# Patient Record
Sex: Female | Born: 1940 | Race: Black or African American | Hispanic: No | Marital: Single | Smoking: Never smoker
Health system: Southern US, Community
[De-identification: ages and names within clinical notes are randomized; demographics above are authoritative.]

## PROBLEM LIST (undated history)

## (undated) ENCOUNTER — Emergency Department (HOSPITAL_COMMUNITY): Admission: EM | Payer: Medicare Other | Source: Home / Self Care

## (undated) DIAGNOSIS — E559 Vitamin D deficiency, unspecified: Secondary | ICD-10-CM

## (undated) DIAGNOSIS — R42 Dizziness and giddiness: Secondary | ICD-10-CM

## (undated) DIAGNOSIS — M2540 Effusion, unspecified joint: Secondary | ICD-10-CM

## (undated) DIAGNOSIS — H269 Unspecified cataract: Secondary | ICD-10-CM

## (undated) DIAGNOSIS — Z9289 Personal history of other medical treatment: Secondary | ICD-10-CM

## (undated) DIAGNOSIS — R350 Frequency of micturition: Secondary | ICD-10-CM

## (undated) DIAGNOSIS — L409 Psoriasis, unspecified: Secondary | ICD-10-CM

## (undated) DIAGNOSIS — T7840XA Allergy, unspecified, initial encounter: Secondary | ICD-10-CM

## (undated) DIAGNOSIS — I10 Essential (primary) hypertension: Secondary | ICD-10-CM

## (undated) DIAGNOSIS — I509 Heart failure, unspecified: Secondary | ICD-10-CM

## (undated) DIAGNOSIS — H409 Unspecified glaucoma: Secondary | ICD-10-CM

## (undated) DIAGNOSIS — M1990 Unspecified osteoarthritis, unspecified site: Secondary | ICD-10-CM

## (undated) DIAGNOSIS — Z8739 Personal history of other diseases of the musculoskeletal system and connective tissue: Secondary | ICD-10-CM

## (undated) DIAGNOSIS — M2550 Pain in unspecified joint: Secondary | ICD-10-CM

## (undated) DIAGNOSIS — N179 Acute kidney failure, unspecified: Secondary | ICD-10-CM

## (undated) DIAGNOSIS — K5190 Ulcerative colitis, unspecified, without complications: Secondary | ICD-10-CM

## (undated) DIAGNOSIS — Z8709 Personal history of other diseases of the respiratory system: Secondary | ICD-10-CM

## (undated) DIAGNOSIS — N950 Postmenopausal bleeding: Secondary | ICD-10-CM

## (undated) HISTORY — PX: TOTAL COLECTOMY: SHX852

## (undated) HISTORY — PX: ILEOSTOMY: SHX1783

## (undated) HISTORY — PX: COLONOSCOPY: SHX174

## (undated) HISTORY — PX: BACK SURGERY: SHX140

## (undated) HISTORY — PX: TUBAL LIGATION: SHX77

---

## 1998-10-09 ENCOUNTER — Ambulatory Visit (HOSPITAL_COMMUNITY): Admission: RE | Admit: 1998-10-09 | Discharge: 1998-10-09 | Payer: Self-pay | Admitting: Neurosurgery

## 1998-10-09 ENCOUNTER — Encounter: Payer: Self-pay | Admitting: Neurosurgery

## 1998-10-13 ENCOUNTER — Ambulatory Visit (HOSPITAL_COMMUNITY): Admission: RE | Admit: 1998-10-13 | Discharge: 1998-10-13 | Payer: Self-pay | Admitting: Neurosurgery

## 1998-10-13 ENCOUNTER — Encounter: Payer: Self-pay | Admitting: Neurosurgery

## 1998-11-27 ENCOUNTER — Encounter: Payer: Self-pay | Admitting: Neurosurgery

## 1998-12-01 ENCOUNTER — Inpatient Hospital Stay (HOSPITAL_COMMUNITY): Admission: RE | Admit: 1998-12-01 | Discharge: 1998-12-04 | Payer: Self-pay | Admitting: Neurosurgery

## 1998-12-01 ENCOUNTER — Encounter: Payer: Self-pay | Admitting: Neurosurgery

## 1999-02-04 ENCOUNTER — Ambulatory Visit (HOSPITAL_COMMUNITY): Admission: RE | Admit: 1999-02-04 | Discharge: 1999-02-04 | Payer: Self-pay | Admitting: Family Medicine

## 1999-02-15 ENCOUNTER — Encounter: Payer: Self-pay | Admitting: Family Medicine

## 1999-02-15 ENCOUNTER — Ambulatory Visit (HOSPITAL_COMMUNITY): Admission: RE | Admit: 1999-02-15 | Discharge: 1999-02-15 | Payer: Self-pay | Admitting: Family Medicine

## 1999-02-25 ENCOUNTER — Other Ambulatory Visit: Admission: RE | Admit: 1999-02-25 | Discharge: 1999-02-25 | Payer: Self-pay | Admitting: Obstetrics

## 1999-02-25 ENCOUNTER — Encounter: Admission: RE | Admit: 1999-02-25 | Discharge: 1999-02-25 | Payer: Self-pay | Admitting: Obstetrics

## 1999-03-11 ENCOUNTER — Encounter: Admission: RE | Admit: 1999-03-11 | Discharge: 1999-03-11 | Payer: Self-pay | Admitting: Obstetrics

## 1999-08-19 ENCOUNTER — Encounter: Admission: RE | Admit: 1999-08-19 | Discharge: 1999-08-19 | Payer: Self-pay | Admitting: Obstetrics

## 1999-09-15 ENCOUNTER — Encounter: Admission: RE | Admit: 1999-09-15 | Discharge: 1999-09-15 | Payer: Self-pay | Admitting: Neurosurgery

## 1999-09-15 ENCOUNTER — Encounter: Payer: Self-pay | Admitting: Neurosurgery

## 2000-05-30 ENCOUNTER — Encounter: Admission: RE | Admit: 2000-05-30 | Discharge: 2000-05-30 | Payer: Self-pay | Admitting: Obstetrics & Gynecology

## 2000-06-07 ENCOUNTER — Ambulatory Visit (HOSPITAL_COMMUNITY): Admission: RE | Admit: 2000-06-07 | Discharge: 2000-06-07 | Payer: Self-pay | Admitting: Obstetrics & Gynecology

## 2000-07-11 ENCOUNTER — Encounter: Admission: RE | Admit: 2000-07-11 | Discharge: 2000-07-11 | Payer: Self-pay | Admitting: Obstetrics & Gynecology

## 2000-07-31 ENCOUNTER — Ambulatory Visit (HOSPITAL_COMMUNITY): Admission: RE | Admit: 2000-07-31 | Discharge: 2000-07-31 | Payer: Self-pay | Admitting: Obstetrics & Gynecology

## 2000-07-31 ENCOUNTER — Encounter (INDEPENDENT_AMBULATORY_CARE_PROVIDER_SITE_OTHER): Payer: Self-pay | Admitting: Specialist

## 2000-08-29 ENCOUNTER — Encounter: Admission: RE | Admit: 2000-08-29 | Discharge: 2000-08-29 | Payer: Self-pay | Admitting: Obstetrics & Gynecology

## 2000-11-02 ENCOUNTER — Other Ambulatory Visit: Admission: RE | Admit: 2000-11-02 | Discharge: 2000-11-02 | Payer: Self-pay | Admitting: Obstetrics & Gynecology

## 2000-11-03 ENCOUNTER — Encounter: Admission: RE | Admit: 2000-11-03 | Discharge: 2000-11-03 | Payer: Self-pay | Admitting: Obstetrics

## 2000-11-10 ENCOUNTER — Ambulatory Visit (HOSPITAL_COMMUNITY): Admission: RE | Admit: 2000-11-10 | Discharge: 2000-11-10 | Payer: Self-pay

## 2000-11-28 ENCOUNTER — Encounter: Admission: RE | Admit: 2000-11-28 | Discharge: 2000-11-28 | Payer: Self-pay | Admitting: Obstetrics & Gynecology

## 2000-12-15 ENCOUNTER — Inpatient Hospital Stay (HOSPITAL_COMMUNITY): Admission: AD | Admit: 2000-12-15 | Discharge: 2000-12-15 | Payer: Self-pay | Admitting: *Deleted

## 2000-12-19 ENCOUNTER — Ambulatory Visit: Admission: RE | Admit: 2000-12-19 | Discharge: 2000-12-19 | Payer: Self-pay | Admitting: Gynecology

## 2001-02-20 ENCOUNTER — Encounter: Admission: RE | Admit: 2001-02-20 | Discharge: 2001-02-20 | Payer: Self-pay | Admitting: Obstetrics & Gynecology

## 2001-04-17 ENCOUNTER — Encounter: Admission: RE | Admit: 2001-04-17 | Discharge: 2001-04-17 | Payer: Self-pay | Admitting: Obstetrics & Gynecology

## 2001-05-07 ENCOUNTER — Encounter (INDEPENDENT_AMBULATORY_CARE_PROVIDER_SITE_OTHER): Payer: Self-pay

## 2001-05-07 ENCOUNTER — Ambulatory Visit (HOSPITAL_COMMUNITY): Admission: RE | Admit: 2001-05-07 | Discharge: 2001-05-07 | Payer: Self-pay | Admitting: Obstetrics & Gynecology

## 2001-05-18 ENCOUNTER — Encounter: Admission: RE | Admit: 2001-05-18 | Discharge: 2001-05-18 | Payer: Self-pay | Admitting: Neurological Surgery

## 2001-05-18 ENCOUNTER — Encounter: Payer: Self-pay | Admitting: Neurological Surgery

## 2001-06-05 ENCOUNTER — Encounter: Admission: RE | Admit: 2001-06-05 | Discharge: 2001-06-05 | Payer: Self-pay | Admitting: Obstetrics & Gynecology

## 2001-07-06 ENCOUNTER — Encounter: Admission: RE | Admit: 2001-07-06 | Discharge: 2001-07-06 | Payer: Self-pay | Admitting: Obstetrics & Gynecology

## 2001-07-06 ENCOUNTER — Other Ambulatory Visit: Admission: RE | Admit: 2001-07-06 | Discharge: 2001-07-06 | Payer: Self-pay | Admitting: *Deleted

## 2001-08-03 ENCOUNTER — Encounter: Admission: RE | Admit: 2001-08-03 | Discharge: 2001-08-03 | Payer: Self-pay | Admitting: Obstetrics & Gynecology

## 2001-08-07 ENCOUNTER — Encounter: Admission: RE | Admit: 2001-08-07 | Discharge: 2001-08-07 | Payer: Self-pay | Admitting: Obstetrics & Gynecology

## 2001-09-18 ENCOUNTER — Encounter: Admission: RE | Admit: 2001-09-18 | Discharge: 2001-09-18 | Payer: Self-pay | Admitting: Obstetrics & Gynecology

## 2002-02-27 ENCOUNTER — Other Ambulatory Visit: Admission: RE | Admit: 2002-02-27 | Discharge: 2002-02-27 | Payer: Self-pay | Admitting: Obstetrics & Gynecology

## 2002-03-04 ENCOUNTER — Ambulatory Visit (HOSPITAL_COMMUNITY): Admission: RE | Admit: 2002-03-04 | Discharge: 2002-03-04 | Payer: Self-pay | Admitting: Obstetrics & Gynecology

## 2002-03-04 ENCOUNTER — Encounter: Payer: Self-pay | Admitting: Obstetrics & Gynecology

## 2002-03-13 ENCOUNTER — Ambulatory Visit (HOSPITAL_COMMUNITY): Admission: RE | Admit: 2002-03-13 | Discharge: 2002-03-13 | Payer: Self-pay | Admitting: Obstetrics & Gynecology

## 2002-03-13 ENCOUNTER — Encounter: Payer: Self-pay | Admitting: Obstetrics & Gynecology

## 2002-03-20 ENCOUNTER — Encounter: Payer: Self-pay | Admitting: Family Medicine

## 2002-03-20 ENCOUNTER — Ambulatory Visit (HOSPITAL_COMMUNITY): Admission: RE | Admit: 2002-03-20 | Discharge: 2002-03-20 | Payer: Self-pay | Admitting: Family Medicine

## 2002-03-25 ENCOUNTER — Ambulatory Visit (HOSPITAL_COMMUNITY): Admission: RE | Admit: 2002-03-25 | Discharge: 2002-03-25 | Payer: Self-pay | Admitting: Family Medicine

## 2002-04-04 ENCOUNTER — Ambulatory Visit (HOSPITAL_COMMUNITY): Admission: RE | Admit: 2002-04-04 | Discharge: 2002-04-04 | Payer: Self-pay | Admitting: Obstetrics & Gynecology

## 2002-04-04 ENCOUNTER — Encounter: Payer: Self-pay | Admitting: Obstetrics & Gynecology

## 2002-04-04 ENCOUNTER — Encounter (INDEPENDENT_AMBULATORY_CARE_PROVIDER_SITE_OTHER): Payer: Self-pay

## 2002-11-05 ENCOUNTER — Ambulatory Visit (HOSPITAL_COMMUNITY): Admission: RE | Admit: 2002-11-05 | Discharge: 2002-11-05 | Payer: Self-pay | Admitting: Obstetrics & Gynecology

## 2002-11-05 ENCOUNTER — Encounter: Payer: Self-pay | Admitting: Obstetrics & Gynecology

## 2002-12-02 ENCOUNTER — Ambulatory Visit (HOSPITAL_COMMUNITY): Admission: RE | Admit: 2002-12-02 | Discharge: 2002-12-02 | Payer: Self-pay | Admitting: Obstetrics & Gynecology

## 2002-12-02 ENCOUNTER — Encounter: Payer: Self-pay | Admitting: Obstetrics & Gynecology

## 2002-12-27 ENCOUNTER — Ambulatory Visit (HOSPITAL_COMMUNITY): Admission: RE | Admit: 2002-12-27 | Discharge: 2002-12-27 | Payer: Self-pay | Admitting: Obstetrics & Gynecology

## 2002-12-27 ENCOUNTER — Encounter: Payer: Self-pay | Admitting: Obstetrics & Gynecology

## 2002-12-27 ENCOUNTER — Encounter (INDEPENDENT_AMBULATORY_CARE_PROVIDER_SITE_OTHER): Payer: Self-pay

## 2003-07-09 ENCOUNTER — Ambulatory Visit (HOSPITAL_COMMUNITY): Admission: RE | Admit: 2003-07-09 | Discharge: 2003-07-09 | Payer: Self-pay | Admitting: Obstetrics & Gynecology

## 2003-08-03 ENCOUNTER — Emergency Department (HOSPITAL_COMMUNITY): Admission: AD | Admit: 2003-08-03 | Discharge: 2003-08-03 | Payer: Self-pay | Admitting: Family Medicine

## 2003-09-18 ENCOUNTER — Ambulatory Visit (HOSPITAL_COMMUNITY): Admission: RE | Admit: 2003-09-18 | Discharge: 2003-09-18 | Payer: Self-pay | Admitting: Internal Medicine

## 2003-11-20 ENCOUNTER — Observation Stay (HOSPITAL_COMMUNITY): Admission: RE | Admit: 2003-11-20 | Discharge: 2003-11-21 | Payer: Self-pay | Admitting: Neurosurgery

## 2004-05-31 ENCOUNTER — Ambulatory Visit: Payer: Self-pay | Admitting: Family Medicine

## 2004-08-30 ENCOUNTER — Ambulatory Visit: Payer: Self-pay | Admitting: Family Medicine

## 2004-08-30 ENCOUNTER — Ambulatory Visit (HOSPITAL_COMMUNITY): Admission: RE | Admit: 2004-08-30 | Discharge: 2004-08-30 | Payer: Self-pay | Admitting: Obstetrics & Gynecology

## 2004-09-29 ENCOUNTER — Ambulatory Visit: Payer: Self-pay | Admitting: Family Medicine

## 2004-10-28 ENCOUNTER — Ambulatory Visit: Payer: Self-pay | Admitting: Family Medicine

## 2004-11-02 ENCOUNTER — Ambulatory Visit: Payer: Self-pay | Admitting: Family Medicine

## 2004-11-17 ENCOUNTER — Emergency Department (HOSPITAL_COMMUNITY): Admission: EM | Admit: 2004-11-17 | Discharge: 2004-11-18 | Payer: Self-pay | Admitting: Emergency Medicine

## 2005-01-11 ENCOUNTER — Ambulatory Visit: Payer: Self-pay | Admitting: Family Medicine

## 2005-01-17 ENCOUNTER — Ambulatory Visit: Payer: Self-pay | Admitting: Family Medicine

## 2005-01-17 LAB — CONVERTED CEMR LAB: Hgb A1c MFr Bld: 5.4 %

## 2005-02-28 ENCOUNTER — Ambulatory Visit: Payer: Self-pay | Admitting: Family Medicine

## 2005-05-02 ENCOUNTER — Ambulatory Visit: Payer: Self-pay | Admitting: Family Medicine

## 2005-11-03 ENCOUNTER — Ambulatory Visit: Payer: Self-pay | Admitting: Family Medicine

## 2006-01-02 ENCOUNTER — Ambulatory Visit: Payer: Self-pay | Admitting: Family Medicine

## 2006-01-23 ENCOUNTER — Ambulatory Visit (HOSPITAL_COMMUNITY): Admission: RE | Admit: 2006-01-23 | Discharge: 2006-01-23 | Payer: Self-pay | Admitting: Neurosurgery

## 2006-02-17 ENCOUNTER — Encounter: Admission: RE | Admit: 2006-02-17 | Discharge: 2006-03-07 | Payer: Self-pay | Admitting: Neurosurgery

## 2006-02-21 ENCOUNTER — Ambulatory Visit: Payer: Self-pay | Admitting: Family Medicine

## 2006-02-21 DIAGNOSIS — M109 Gout, unspecified: Secondary | ICD-10-CM | POA: Insufficient documentation

## 2006-06-21 ENCOUNTER — Ambulatory Visit: Payer: Self-pay | Admitting: Family Medicine

## 2006-11-02 ENCOUNTER — Ambulatory Visit: Payer: Self-pay | Admitting: Family Medicine

## 2006-12-21 ENCOUNTER — Encounter (INDEPENDENT_AMBULATORY_CARE_PROVIDER_SITE_OTHER): Payer: Self-pay | Admitting: Family Medicine

## 2007-04-19 ENCOUNTER — Encounter: Payer: Self-pay | Admitting: Family Medicine

## 2007-04-19 DIAGNOSIS — M519 Unspecified thoracic, thoracolumbar and lumbosacral intervertebral disc disorder: Secondary | ICD-10-CM | POA: Insufficient documentation

## 2007-04-19 DIAGNOSIS — N949 Unspecified condition associated with female genital organs and menstrual cycle: Secondary | ICD-10-CM | POA: Insufficient documentation

## 2007-04-19 DIAGNOSIS — J45909 Unspecified asthma, uncomplicated: Secondary | ICD-10-CM | POA: Insufficient documentation

## 2007-04-19 DIAGNOSIS — I10 Essential (primary) hypertension: Secondary | ICD-10-CM | POA: Insufficient documentation

## 2007-04-19 DIAGNOSIS — L408 Other psoriasis: Secondary | ICD-10-CM | POA: Insufficient documentation

## 2007-04-19 DIAGNOSIS — H4089 Other specified glaucoma: Secondary | ICD-10-CM | POA: Insufficient documentation

## 2007-04-19 DIAGNOSIS — K5190 Ulcerative colitis, unspecified, without complications: Secondary | ICD-10-CM | POA: Insufficient documentation

## 2007-04-19 LAB — CONVERTED CEMR LAB
HDL: 30 mg/dL
LDL Cholesterol: 80 mg/dL
Triglycerides: 137 mg/dL

## 2007-04-23 ENCOUNTER — Ambulatory Visit: Payer: Self-pay | Admitting: Family Medicine

## 2007-04-26 ENCOUNTER — Ambulatory Visit: Payer: Self-pay | Admitting: Family Medicine

## 2007-04-26 LAB — CONVERTED CEMR LAB
ALT: 14 units/L (ref 0–35)
AST: 27 units/L (ref 0–37)
Albumin: 3.5 g/dL (ref 3.5–5.2)
Alkaline Phosphatase: 61 units/L (ref 39–117)
BUN: 17 mg/dL (ref 6–23)
CO2: 25 meq/L (ref 19–32)
Calcium: 8.1 mg/dL — ABNORMAL LOW (ref 8.4–10.5)
Chloride: 103 meq/L (ref 96–112)
Cholesterol: 156 mg/dL (ref 0–200)
Creatinine, Ser: 1.34 mg/dL — ABNORMAL HIGH (ref 0.40–1.20)
Glucose, Bld: 82 mg/dL (ref 70–99)
HDL: 55 mg/dL (ref >39)
LDL Cholesterol: 82 mg/dL (ref 0–99)
Potassium: 3.6 meq/L (ref 3.5–5.3)
Sodium: 142 meq/L (ref 135–145)
Total Bilirubin: 0.7 mg/dL (ref 0.3–1.2)
Total CHOL/HDL Ratio: 2.8
Total Protein: 7.2 g/dL (ref 6.0–8.3)
Triglycerides: 97 mg/dL (ref <150)
VLDL: 19 mg/dL (ref 0–40)
Vit D, 1,25-Dihydroxy: 6 — ABNORMAL LOW (ref 20–57)

## 2007-04-30 DIAGNOSIS — E559 Vitamin D deficiency, unspecified: Secondary | ICD-10-CM | POA: Insufficient documentation

## 2007-06-25 ENCOUNTER — Ambulatory Visit: Payer: Self-pay | Admitting: Family Medicine

## 2007-06-25 DIAGNOSIS — R7989 Other specified abnormal findings of blood chemistry: Secondary | ICD-10-CM | POA: Insufficient documentation

## 2007-06-25 DIAGNOSIS — J309 Allergic rhinitis, unspecified: Secondary | ICD-10-CM | POA: Insufficient documentation

## 2007-08-24 ENCOUNTER — Ambulatory Visit: Payer: Self-pay | Admitting: Family Medicine

## 2007-08-24 LAB — CONVERTED CEMR LAB
ALT: 18 U/L
AST: 29 U/L
Albumin: 3.3 g/dL — ABNORMAL LOW
Alkaline Phosphatase: 69 U/L
BUN: 14 mg/dL
CO2: 26 meq/L
Calcium: 8.2 mg/dL — ABNORMAL LOW
Chloride: 107 meq/L
Creatinine, Ser: 1.34 mg/dL — ABNORMAL HIGH
Glucose, Bld: 80 mg/dL
Potassium: 3.6 meq/L
Sodium: 144 meq/L
Total Bilirubin: 0.6 mg/dL
Total Protein: 7.3 g/dL

## 2007-08-25 ENCOUNTER — Encounter (INDEPENDENT_AMBULATORY_CARE_PROVIDER_SITE_OTHER): Payer: Self-pay | Admitting: Family Medicine

## 2007-08-25 LAB — CONVERTED CEMR LAB
Collection Interval-CRCL: 24 hr
Creatinine 24 HR UR: 1367 mg/24hr (ref 700–1800)
Creatinine Clearance: 72 mL/min — ABNORMAL LOW (ref 75–115)
Creatinine, Urine: 170.9 mg/dL

## 2007-09-14 ENCOUNTER — Emergency Department (HOSPITAL_COMMUNITY): Admission: EM | Admit: 2007-09-14 | Discharge: 2007-09-14 | Payer: Self-pay | Admitting: Emergency Medicine

## 2007-12-24 ENCOUNTER — Encounter (INDEPENDENT_AMBULATORY_CARE_PROVIDER_SITE_OTHER): Payer: Self-pay | Admitting: Family Medicine

## 2007-12-31 ENCOUNTER — Ambulatory Visit: Payer: Self-pay | Admitting: Family Medicine

## 2007-12-31 DIAGNOSIS — R1032 Left lower quadrant pain: Secondary | ICD-10-CM | POA: Insufficient documentation

## 2008-01-14 ENCOUNTER — Ambulatory Visit: Payer: Self-pay | Admitting: Gastroenterology

## 2008-01-14 ENCOUNTER — Encounter (INDEPENDENT_AMBULATORY_CARE_PROVIDER_SITE_OTHER): Payer: Self-pay | Admitting: Family Medicine

## 2008-01-22 ENCOUNTER — Emergency Department (HOSPITAL_COMMUNITY): Admission: EM | Admit: 2008-01-22 | Discharge: 2008-01-22 | Payer: Self-pay | Admitting: Emergency Medicine

## 2008-02-11 ENCOUNTER — Ambulatory Visit (HOSPITAL_COMMUNITY): Admission: RE | Admit: 2008-02-11 | Discharge: 2008-02-11 | Payer: Self-pay | Admitting: Obstetrics & Gynecology

## 2008-02-18 LAB — CONVERTED CEMR LAB
ALT: 17 units/L (ref 0–35)
AST: 32 units/L (ref 0–37)
Albumin: 3.3 g/dL — ABNORMAL LOW (ref 3.5–5.2)
Alkaline Phosphatase: 74 units/L (ref 39–117)
BUN: 18 mg/dL (ref 6–23)
CO2: 26 meq/L (ref 19–32)
Calcium: 8.7 mg/dL (ref 8.4–10.5)
Chloride: 106 meq/L (ref 96–112)
Creatinine, Ser: 1.28 mg/dL — ABNORMAL HIGH (ref 0.40–1.20)
Glucose, Bld: 69 mg/dL — ABNORMAL LOW (ref 70–99)
Potassium: 3.9 meq/L (ref 3.5–5.3)
Sodium: 144 meq/L (ref 135–145)
Total Bilirubin: 0.5 mg/dL (ref 0.3–1.2)
Total Protein: 8 g/dL (ref 6.0–8.3)
Vit D, 1,25-Dihydroxy: 11 — ABNORMAL LOW (ref 30–89)

## 2008-04-11 ENCOUNTER — Encounter (INDEPENDENT_AMBULATORY_CARE_PROVIDER_SITE_OTHER): Payer: Self-pay | Admitting: Family Medicine

## 2008-05-19 ENCOUNTER — Inpatient Hospital Stay (HOSPITAL_COMMUNITY): Admission: EM | Admit: 2008-05-19 | Discharge: 2008-05-21 | Payer: Self-pay | Admitting: Emergency Medicine

## 2008-05-26 ENCOUNTER — Ambulatory Visit: Payer: Self-pay | Admitting: Family Medicine

## 2008-05-26 DIAGNOSIS — R079 Chest pain, unspecified: Secondary | ICD-10-CM | POA: Insufficient documentation

## 2008-06-14 ENCOUNTER — Telehealth (INDEPENDENT_AMBULATORY_CARE_PROVIDER_SITE_OTHER): Payer: Self-pay | Admitting: Family Medicine

## 2008-06-30 ENCOUNTER — Ambulatory Visit: Payer: Self-pay | Admitting: Family Medicine

## 2008-07-01 ENCOUNTER — Encounter (INDEPENDENT_AMBULATORY_CARE_PROVIDER_SITE_OTHER): Payer: Self-pay | Admitting: Family Medicine

## 2008-07-28 ENCOUNTER — Encounter (INDEPENDENT_AMBULATORY_CARE_PROVIDER_SITE_OTHER): Payer: Self-pay | Admitting: Family Medicine

## 2008-07-28 LAB — CONVERTED CEMR LAB
ALT: 22 units/L (ref 0–35)
AST: 44 units/L — ABNORMAL HIGH (ref 0–37)
Albumin: 3.3 g/dL — ABNORMAL LOW (ref 3.5–5.2)
Alkaline Phosphatase: 90 units/L (ref 39–117)
BUN: 19 mg/dL (ref 6–23)
CO2: 23 meq/L (ref 19–32)
Calcium, Total (PTH): 8.9 mg/dL (ref 8.4–10.5)
Calcium: 8.9 mg/dL (ref 8.4–10.5)
Chloride: 104 meq/L (ref 96–112)
Creatinine, Ser: 1.16 mg/dL (ref 0.40–1.20)
Glucose, Bld: 88 mg/dL (ref 70–99)
PTH: 25.6 pg/mL (ref 14.0–72.0)
Potassium: 3.7 meq/L (ref 3.5–5.3)
Sodium: 142 meq/L (ref 135–145)
Total Bilirubin: 0.8 mg/dL (ref 0.3–1.2)
Total Protein: 7.5 g/dL (ref 6.0–8.3)
Vit D, 1,25-Dihydroxy: 8 — ABNORMAL LOW (ref 30–89)

## 2008-08-12 ENCOUNTER — Ambulatory Visit (HOSPITAL_COMMUNITY): Admission: RE | Admit: 2008-08-12 | Discharge: 2008-08-12 | Payer: Self-pay | Admitting: Obstetrics & Gynecology

## 2008-09-30 ENCOUNTER — Encounter: Payer: Self-pay | Admitting: Obstetrics & Gynecology

## 2008-09-30 ENCOUNTER — Ambulatory Visit (HOSPITAL_COMMUNITY): Admission: RE | Admit: 2008-09-30 | Discharge: 2008-09-30 | Payer: Self-pay | Admitting: Obstetrics & Gynecology

## 2008-11-03 ENCOUNTER — Ambulatory Visit: Payer: Self-pay | Admitting: Family Medicine

## 2008-11-04 LAB — CONVERTED CEMR LAB: Vit D, 25-Hydroxy: 28 ng/mL — ABNORMAL LOW (ref 30–89)

## 2008-11-15 ENCOUNTER — Encounter (INDEPENDENT_AMBULATORY_CARE_PROVIDER_SITE_OTHER): Payer: Self-pay | Admitting: Family Medicine

## 2008-11-21 ENCOUNTER — Encounter (INDEPENDENT_AMBULATORY_CARE_PROVIDER_SITE_OTHER): Payer: Self-pay | Admitting: Family Medicine

## 2008-12-24 ENCOUNTER — Encounter (INDEPENDENT_AMBULATORY_CARE_PROVIDER_SITE_OTHER): Payer: Self-pay | Admitting: Family Medicine

## 2009-01-02 ENCOUNTER — Telehealth (INDEPENDENT_AMBULATORY_CARE_PROVIDER_SITE_OTHER): Payer: Self-pay | Admitting: Family Medicine

## 2009-01-05 ENCOUNTER — Ambulatory Visit: Payer: Self-pay | Admitting: Family Medicine

## 2009-01-07 ENCOUNTER — Encounter (INDEPENDENT_AMBULATORY_CARE_PROVIDER_SITE_OTHER): Payer: Self-pay | Admitting: Family Medicine

## 2009-01-07 LAB — CONVERTED CEMR LAB: Vit D, 25-Hydroxy: 43 ng/mL (ref 30–89)

## 2009-01-13 ENCOUNTER — Telehealth (INDEPENDENT_AMBULATORY_CARE_PROVIDER_SITE_OTHER): Payer: Self-pay | Admitting: Family Medicine

## 2009-02-26 ENCOUNTER — Encounter: Admission: RE | Admit: 2009-02-26 | Discharge: 2009-02-26 | Payer: Self-pay | Admitting: Neurosurgery

## 2009-02-26 ENCOUNTER — Encounter (INDEPENDENT_AMBULATORY_CARE_PROVIDER_SITE_OTHER): Payer: Self-pay | Admitting: Family Medicine

## 2009-03-10 ENCOUNTER — Encounter (INDEPENDENT_AMBULATORY_CARE_PROVIDER_SITE_OTHER): Payer: Self-pay | Admitting: Internal Medicine

## 2009-04-03 ENCOUNTER — Encounter: Admission: RE | Admit: 2009-04-03 | Discharge: 2009-05-01 | Payer: Self-pay | Admitting: Neurosurgery

## 2009-05-13 ENCOUNTER — Encounter (INDEPENDENT_AMBULATORY_CARE_PROVIDER_SITE_OTHER): Payer: Self-pay | Admitting: Nurse Practitioner

## 2009-07-22 ENCOUNTER — Telehealth (INDEPENDENT_AMBULATORY_CARE_PROVIDER_SITE_OTHER): Payer: Self-pay | Admitting: Internal Medicine

## 2009-07-25 ENCOUNTER — Emergency Department (HOSPITAL_COMMUNITY): Admission: EM | Admit: 2009-07-25 | Discharge: 2009-07-25 | Payer: Self-pay | Admitting: Emergency Medicine

## 2009-07-30 ENCOUNTER — Ambulatory Visit: Payer: Self-pay | Admitting: Internal Medicine

## 2009-07-30 DIAGNOSIS — E876 Hypokalemia: Secondary | ICD-10-CM | POA: Insufficient documentation

## 2009-08-04 ENCOUNTER — Ambulatory Visit: Payer: Self-pay | Admitting: Internal Medicine

## 2009-08-11 ENCOUNTER — Ambulatory Visit: Payer: Self-pay | Admitting: Internal Medicine

## 2009-08-18 ENCOUNTER — Ambulatory Visit: Payer: Self-pay | Admitting: Internal Medicine

## 2009-09-28 ENCOUNTER — Encounter (INDEPENDENT_AMBULATORY_CARE_PROVIDER_SITE_OTHER): Payer: Self-pay | Admitting: Internal Medicine

## 2009-09-29 ENCOUNTER — Telehealth (INDEPENDENT_AMBULATORY_CARE_PROVIDER_SITE_OTHER): Payer: Self-pay | Admitting: Internal Medicine

## 2009-10-20 ENCOUNTER — Ambulatory Visit: Payer: Self-pay | Admitting: Internal Medicine

## 2009-10-20 DIAGNOSIS — R609 Edema, unspecified: Secondary | ICD-10-CM | POA: Insufficient documentation

## 2009-11-16 ENCOUNTER — Encounter (INDEPENDENT_AMBULATORY_CARE_PROVIDER_SITE_OTHER): Payer: Self-pay | Admitting: Internal Medicine

## 2009-11-17 ENCOUNTER — Telehealth: Payer: Self-pay | Admitting: Physician Assistant

## 2009-11-18 ENCOUNTER — Telehealth (INDEPENDENT_AMBULATORY_CARE_PROVIDER_SITE_OTHER): Payer: Self-pay | Admitting: Internal Medicine

## 2009-11-21 ENCOUNTER — Encounter (INDEPENDENT_AMBULATORY_CARE_PROVIDER_SITE_OTHER): Payer: Self-pay | Admitting: Internal Medicine

## 2010-01-08 LAB — CONVERTED CEMR LAB: Pap Smear: NORMAL

## 2010-02-18 ENCOUNTER — Ambulatory Visit: Payer: Self-pay | Admitting: Internal Medicine

## 2010-02-18 DIAGNOSIS — R011 Cardiac murmur, unspecified: Secondary | ICD-10-CM | POA: Insufficient documentation

## 2010-02-18 DIAGNOSIS — I491 Atrial premature depolarization: Secondary | ICD-10-CM | POA: Insufficient documentation

## 2010-02-18 DIAGNOSIS — I4949 Other premature depolarization: Secondary | ICD-10-CM | POA: Insufficient documentation

## 2010-02-18 LAB — CONVERTED CEMR LAB
Bilirubin Urine: NEGATIVE
Blood in Urine, dipstick: NEGATIVE
Glucose, Urine, Semiquant: NEGATIVE
Ketones, urine, test strip: NEGATIVE
Nitrite: NEGATIVE
Protein, U semiquant: NEGATIVE
Specific Gravity, Urine: 1.01
Urobilinogen, UA: 0.2
WBC Urine, dipstick: NEGATIVE
pH: 6

## 2010-02-19 ENCOUNTER — Telehealth (INDEPENDENT_AMBULATORY_CARE_PROVIDER_SITE_OTHER): Payer: Self-pay | Admitting: Internal Medicine

## 2010-02-21 ENCOUNTER — Ambulatory Visit: Payer: Self-pay | Admitting: Cardiology

## 2010-03-02 DIAGNOSIS — N259 Disorder resulting from impaired renal tubular function, unspecified: Secondary | ICD-10-CM | POA: Insufficient documentation

## 2010-03-02 LAB — CONVERTED CEMR LAB
ALT: 16 units/L (ref 0–35)
AST: 33 units/L (ref 0–37)
Albumin: 3.4 g/dL — ABNORMAL LOW (ref 3.5–5.2)
Alkaline Phosphatase: 106 units/L (ref 39–117)
BUN: 12 mg/dL (ref 6–23)
CO2: 27 meq/L (ref 19–32)
Calcium: 8.5 mg/dL (ref 8.4–10.5)
Chloride: 105 meq/L (ref 96–112)
Cholesterol: 150 mg/dL (ref 0–200)
Creatinine, Ser: 1.3 mg/dL — ABNORMAL HIGH (ref 0.40–1.20)
Glucose, Bld: 68 mg/dL — ABNORMAL LOW (ref 70–99)
HDL: 53 mg/dL (ref >39)
LDL Cholesterol: 75 mg/dL (ref 0–99)
Potassium: 3.7 meq/L (ref 3.5–5.3)
Sodium: 145 meq/L (ref 135–145)
Total Bilirubin: 0.7 mg/dL (ref 0.3–1.2)
Total CHOL/HDL Ratio: 2.8
Total Protein: 7.5 g/dL (ref 6.0–8.3)
Triglycerides: 110 mg/dL (ref <150)
VLDL: 22 mg/dL (ref 0–40)

## 2010-03-03 ENCOUNTER — Ambulatory Visit: Payer: Self-pay | Admitting: Internal Medicine

## 2010-03-03 ENCOUNTER — Encounter (INDEPENDENT_AMBULATORY_CARE_PROVIDER_SITE_OTHER): Payer: Self-pay | Admitting: Internal Medicine

## 2010-03-03 ENCOUNTER — Ambulatory Visit (HOSPITAL_COMMUNITY): Admission: RE | Admit: 2010-03-03 | Discharge: 2010-03-03 | Payer: Self-pay | Admitting: Internal Medicine

## 2010-03-03 DIAGNOSIS — I079 Rheumatic tricuspid valve disease, unspecified: Secondary | ICD-10-CM | POA: Insufficient documentation

## 2010-03-03 LAB — CONVERTED CEMR LAB
Basophils Absolute: 0.1 10*3/uL (ref 0.0–0.1)
Basophils Relative: 1 % (ref 0–1)
Eosinophils Absolute: 0.1 10*3/uL (ref 0.0–0.7)
Eosinophils Relative: 1 % (ref 0–5)
HCT: 45 % (ref 36.0–46.0)
Hemoglobin: 15 g/dL (ref 12.0–15.0)
Lymphocytes Relative: 49 % — ABNORMAL HIGH (ref 12–46)
Lymphs Abs: 5 10*3/uL — ABNORMAL HIGH (ref 0.7–4.0)
MCHC: 33.3 g/dL (ref 30.0–36.0)
MCV: 93.6 fL (ref 78.0–100.0)
Monocytes Absolute: 0.9 10*3/uL (ref 0.1–1.0)
Monocytes Relative: 9 % (ref 3–12)
Neutro Abs: 4.1 10*3/uL (ref 1.7–7.7)
Neutrophils Relative %: 41 % — ABNORMAL LOW (ref 43–77)
Platelets: 229 10*3/uL (ref 150–400)
RBC: 4.81 M/uL (ref 3.87–5.11)
RDW: 14 % (ref 11.5–15.5)
WBC: 10.2 10*3/uL (ref 4.0–10.5)

## 2010-03-11 ENCOUNTER — Encounter (INDEPENDENT_AMBULATORY_CARE_PROVIDER_SITE_OTHER): Payer: Self-pay | Admitting: Internal Medicine

## 2010-03-20 ENCOUNTER — Encounter (INDEPENDENT_AMBULATORY_CARE_PROVIDER_SITE_OTHER): Payer: Self-pay | Admitting: Internal Medicine

## 2010-03-20 DIAGNOSIS — IMO0002 Reserved for concepts with insufficient information to code with codable children: Secondary | ICD-10-CM | POA: Insufficient documentation

## 2010-03-20 DIAGNOSIS — M1710 Unilateral primary osteoarthritis, unspecified knee: Secondary | ICD-10-CM | POA: Insufficient documentation

## 2010-05-05 ENCOUNTER — Telehealth (INDEPENDENT_AMBULATORY_CARE_PROVIDER_SITE_OTHER): Payer: Self-pay | Admitting: Internal Medicine

## 2010-07-22 ENCOUNTER — Telehealth (INDEPENDENT_AMBULATORY_CARE_PROVIDER_SITE_OTHER): Payer: Self-pay | Admitting: Internal Medicine

## 2010-08-20 ENCOUNTER — Ambulatory Visit: Payer: Self-pay | Admitting: Internal Medicine

## 2010-10-03 ENCOUNTER — Encounter: Payer: Self-pay | Admitting: Obstetrics & Gynecology

## 2010-10-14 NOTE — Letter (Signed)
Summary: [facility]   Imported By: Roland Earl 04/06/2010 14:51:09  _____________________________________________________________________  External Attachment:    Type:   Image     Comment:   External Document

## 2010-10-14 NOTE — Assessment & Plan Note (Signed)
Summary: 2 MONTH FU ON LEG SWELLING///KT   Vital Signs:  Patient profile:   70 year old female Weight:      240.6 pounds Temp:     98.3 degrees F Pulse rate:   76 / minute Pulse rhythm:   regular Resp:     16 per minute BP sitting:   145 / 84  (left arm) Cuff size:   large  Vitals Entered By: Shellia Carwin CMA (October 20, 2009 2:22 PM) CC: f/u lower leg cellulitis per pt doing much better. Is Patient Diabetic? No Pain Assessment Patient in pain? no       Does patient need assistance? Ambulation Normal   Primary Care Provider:  Milagros Evener MD  CC:  f/u lower leg cellulitis per pt doing much better.Marland Kitchen  History of Present Illness: 1.  LE edema:  much better.  Using Furosemide only as needed--takes potassium when uses.  On HCTz regularly with BP control.  2.  Cellulitis:  Skin changes gradually resolving--skin still flaking off where thickened and darkened--getting back to normal.  3.  Asthma:  wheezing on and off since last week.  Using Singulair, Advair, Cetirizine regularly.  Does not have a rescue inhaler.    Allergies (verified): 1)  ! Sulfa  Physical Exam  General:  NAD Lungs:  Scant scattered end expiratory wheeze. Heart:  Normal rate and regular rhythm. S1 and S2 normal without gallop, murmur, click, rub or other extra sounds. Extremities:  trace to mild edema of bilateral feet and pretibial areas.  Still with some hyperpigmentation in same areas where cellulitis was most significant, but appears to be resolving.  No cracking of skin or erythema   Impression & Recommendations:  Problem # 1:  CELLULITIS OF LEGS AND FEET (ICD-682.8) Resolved  Problem # 2:  EDEMA (ICD-782.3) Improved--continue to use furosemide as needed  Her updated medication list for this problem includes:    Diovan Hct 160-25 Mg Tabs (Valsartan-hydrochlorothiazide) .Marland Kitchen... Take 1 tablet by mouth once a day    Furosemide 20 Mg Tabs (Furosemide) .Marland Kitchen... 1 tab by mouth in  morning.  Problem # 3:  ASTHMA (ICD-493.90) Rescue inhaler written. Her updated medication list for this problem includes:    Advair Diskus 250-50 Mcg/dose Misc (Fluticasone-salmeterol) .Marland Kitchen... 1 inhalation two times a day    Singulair 10 Mg Tabs (Montelukast sodium) .Marland Kitchen... 1 by mouth once daily    Proventil Hfa 108 (90 Base) Mcg/act Aers (Albuterol sulfate) .Marland Kitchen... 2 puffs every 4 hours as needed for wheezing.  Complete Medication List: 1)  Advair Diskus 250-50 Mcg/dose Misc (Fluticasone-salmeterol) .Marland Kitchen.. 1 inhalation two times a day 2)  Meclizine Hcl 25 Mg Tabs (Meclizine hcl) .... 1/2 to 1 by mouth two times a day as needed for dizziness 3)  Promethazine Hcl 25 Mg Tabs (Promethazine hcl) .... Take 1 tablet by mouth every 8 hours as needed nausea and vomiting 4)  Diovan Hct 160-25 Mg Tabs (Valsartan-hydrochlorothiazide) .... Take 1 tablet by mouth once a day 5)  Allopurinol 100 Mg Tabs (Allopurinol) .Marland Kitchen.. 1 by mouth once daily 6)  Xalatan 0.005 % Soln (Latanoprost) .... Eye gtts 7)  Singulair 10 Mg Tabs (Montelukast sodium) .Marland Kitchen.. 1 by mouth once daily 8)  Betopics Opthalmic Qtts  .Marland Kitchen.. 1 drop each eye once daily (per dr.boyd) 9)  Colchicine 0.6 Mg Tabs (Colchicine) .... Take 1 tablet by mouth every 8 hours as needed gout 10)  Zyrtec Allergy 10 Mg Tabs (Cetirizine hcl) .... Take 1 tablet by  mouth once a day for allergies 11)  Lyrica 75 Mg Caps (Pregabalin) .... Take 1 capsule by mouth every 12 hours as needed rib nerve pain 12)  Vitamin D3 1000 Unit Caps (Cholecalciferol) .... Take 1 capsule by mouth once a day 13)  Furosemide 20 Mg Tabs (Furosemide) .Marland Kitchen.. 1 tab by mouth in morning. 14)  Potassium Chloride Cr 10 Meq Cr-caps (Potassium chloride) .Marland Kitchen.. 1 tab by mouth daily on days you take furosemide 15)  Proventil Hfa 108 (90 Base) Mcg/act Aers (Albuterol sulfate) .... 2 puffs every 4 hours as needed for wheezing.  Patient Instructions: 1)  Schedule CPP with Dr. Amil Amen in next 4  months Prescriptions: PROVENTIL HFA 108 (90 BASE) MCG/ACT AERS (ALBUTEROL SULFATE) 2 puffs every 4 hours as needed for wheezing.  #1 x 0   Entered and Authorized by:   Mack Hook MD   Signed by:   Mack Hook MD on 10/20/2009   Method used:   Electronically to        [id number] (retail)       [address] [place], [place]  [address]       Ph: [phone]       Fax: [phone]   RxID:   [id number]

## 2010-10-14 NOTE — Progress Notes (Signed)
  Phone Note Outgoing Call   Summary of Call: Refill request for promethazine rec'd from pharmacy. Not a chronic med. Please confirm why patient needs this medicine. Dr. Melissa Noon patient. . . will cc copy to her as well  Initial call taken by: Versie Starks,  November 17, 2009 8:08 AM  Follow-up for Phone Call        Will call in Meclizine--but I don't want her taking both Meclizine and Promethazine.  She needs to call if the Meclizine is not helping.  Taking for vertigo--intermittent symptoms. Follow-up by: Mack Hook MD,  November 17, 2009 8:37 AM  Additional Follow-up for Phone Call Additional follow up Details #1::        Pt notified. Additional Follow-up by: Shellia Carwin CMA,  November 18, 2009 12:37 PM    Prescriptions: MECLIZINE HCL 25 MG TABS (MECLIZINE HCL) 1/2 to 1 by mouth two times a day as needed for dizziness  #30 x 2   Entered and Authorized by:   Mack Hook MD   Signed by:   Mack Hook MD on 11/17/2009   Method used:   Electronically to        [id number] (retail)       [address] [place], [place]  [address]       Ph: [phone]       Fax: [phone]   RxID:   [id number]

## 2010-10-14 NOTE — Progress Notes (Signed)
  Phone Note From Other Clinic   Summary of Call: Tonya from [facility] lab called and said they did not recieve lavender tube for CBC. Initial call taken by: Isla Pence,  February 19, 2010 4:12 PM  Follow-up for Phone Call        Left message on answering machine for pt to return call at [phone]. Follow-up by: Shellia Carwin CMA,  February 22, 2010 11:07 AM  Additional Follow-up for Phone Call Additional follow up Details #1::        Pt come back for cbc. Additional Follow-up by: Shellia Carwin CMA,  February 22, 2010 3:33 PM

## 2010-10-14 NOTE — Progress Notes (Signed)
Summary: NUTRITIONIST REFERRAL   Phone Note Outgoing Call   Summary of Call: pt call [place] nutritionist and canceled her appt  Initial call taken by: Maren Reamer,  May 05, 2010 5:14 PM  Follow-up for Phone Call        Please call pt. and find out why--see if she can be persuaded to go. Follow-up by: Mack Hook MD,  May 12, 2010 8:12 AM  Additional Follow-up for Phone Call Additional follow up Details #1::        [facility] Chantel Sabra Heck  May 12, 2010 10:10 AM no answer Rhunette Croft  May 12, 2010 4:55 PM   [facility] Rhunette Croft  May 14, 2010 12:24 PM  pt changed her mind an ecided not to go to appt. pt is not to reschedule Additional Follow-up by: Rhunette Croft,  May 14, 2010 2:32 PM

## 2010-10-14 NOTE — Letter (Signed)
Summary: *[facility] Results Follow up  [place], [place] [address]   Phone: [phone]  Fax: [phone]      03/11/2010   NATOYA VISCOMI [address] [place], [place]  [address]   Dear  Ms. [name],                            ____S.Drinkard,FNP   ____D. Gore,FNP       ____B. McPherson,MD   ____V. Rankins,MD    __X__E. Shi Grose,MD    ____N. Hassell Done, FNP  ____D. Jobe Igo, MD    ____K. Tomma Lightning, MD    ____Other     This letter is to inform you that your recent test(s):  _______Pap Smear    ___X____Lab Test     _______X-ray    ___X____ is within acceptable limits  _______ requires a medication change  _______ requires a follow-up lab visit  _______ requires a follow-up visit with your provider   Comments:  Blood cell counts were normal.  You were told to follow up in 3 months, but can call and cancel that--as it appears you are doing fine, just need to see you back 6 months from your physical date.       _________________________________________________________ If you have any questions, please contact our office                     Sincerely,  Mack Hook MD [facility]

## 2010-10-14 NOTE — Medication Information (Signed)
Summary: RX Folder//LIBERY  RX Folder//LIBERY   Imported By: Roland Earl 01/14/2010 14:58:11  _____________________________________________________________________  External Attachment:    Type:   Image     Comment:   External Document

## 2010-10-14 NOTE — Letter (Signed)
Summary: Generic Letter  [facility]  [address] [place], [place] [address]   Phone: [phone]  Fax: [phone]    11/21/2009  Re:  Jackie Parker      [address]      [place], [place]  [address]  To Whom It May Concern:  Ms. Jackie Parker requires extra ostomy supplies as she has frequent daily bowel movements.  If you need more information, do not hesitate to notify my office.         Sincerely,   Mack Hook MD

## 2010-10-14 NOTE — Progress Notes (Signed)
Summary: Office Visit//DEPRESSION SCREENING  Office Visit//DEPRESSION SCREENING   Imported By: Roland Earl 02/19/2010 09:30:03  _____________________________________________________________________  External Attachment:    Type:   Image     Comment:   External Document

## 2010-10-14 NOTE — Progress Notes (Signed)
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 SHE WOULD LIKE TO GET A NOTE EXCUSING HER FROM DUTY. MS Stavely SAYS THAT SHE HAS A HARD TIME GETTING UP AND DOWN WITH THE BACK PAIN AND NOT ABLE TO WALK FAR AND SIT FOR A LONG PERIOD OF TIME. SHE HAS HAD 2 BACK SURGERIES AND STILL HAS PROBLEMS.  Initial call taken by: Roberto Scales,  September 29, 2009 12:07 PM  Follow-up for Phone Call        Fwd to Dr. Amil Amen for review. Follow-up by: Shellia Carwin CMA,  September 30, 2009 11:48 AM  Additional Follow-up for Phone Call Additional follow up Details #1::        No medical reason to forgo jury duty Additional Follow-up by: Mack Hook MD,  October 02, 2009 5:33 PM    Additional Follow-up for Phone Call Additional follow up Details #2::    Pt notified, she will come by to pick form back up. Follow-up by: Shellia Carwin CMA,  October 05, 2009 4:40 PM

## 2010-10-14 NOTE — Letter (Signed)
Summary: TEST ORDER FORM//2 D ECHO//APPT DATE & TIME  TEST ORDER FORM//2 D ECHO//APPT DATE & TIME   Imported By: Roland Earl 02/18/2010 11:00:54  _____________________________________________________________________  External Attachment:    Type:   Image     Comment:   External Document

## 2010-10-14 NOTE — Assessment & Plan Note (Signed)
Summary: 6 MONTH FU ON BP////KT   Vital Signs:  Patient profile:   70 year old female Menstrual status:  postmenopausal Weight:      239.25 pounds BMI:     43.92 O2 Sat:      95 % on Room air Temp:     97.7 degrees F oral Pulse rate:   106 / minute Pulse rhythm:   regular Resp:     18 per minute BP sitting:   138 / 82  (left arm) Cuff size:   regular  Vitals Entered By: Aquilla Solian CMA (August 20, 2010 10:02 AM)  O2 Flow:  Room air  Serial Vital Signs/Assessments:                                PEF    PreRx  PostRx Time      O2 Sat  O2 Type     L/min  L/min  L/min   By 10:12 AM  95  %   Room air                          Aquilla Solian CMA  Comments: 10:12 AM Peak Flow: 350 - 400 - 350 By: Aquilla Solian CMA   CC: 6 month f/u on BP, HTN and psoriasis. Is Patient Diabetic? No Pain Assessment Patient in pain? yes     Location: lower back Intensity: 7/8 Type: aching Onset of pain  Constant  Does patient need assistance? Functional Status Self care Ambulation Normal     Menstrual Status postmenopausal Last PAP Result normal-per pt--Dr. Delsa Sale  at [facility]   Primary Care Provider:  Mack Hook MD  CC:  6 month f/u on BP and HTN and psoriasis.Marland Kitchen  History of Present Illness: 1.  Weight concerns:  pt. decided she did not want to go to Nutrition, but try to get started with lifestyle changes on her own.  She has lost a bit of weight and is exercising.  Has made some changes to diet.  2.  Psoriasis:  Was very bad.  Better since using Clobetasol, though sounds like she was never out of it.  Has an appt.  next month with Dr Allyson Sabal, derm.  3.  Hypertension:  Has lost about 5 lbs--started working on weight end of July    4.  Left knee:  using treadmill and exercise bike and feels like knee actually improved with that--was having knee pain earlier in summer/early fall.  Might be interested in water exercises.  5.  Asthma:  Using rescue inhaler two  times a day, though doesn't really need it for wheezing, just did not know how to wean off from last time she was ill.  Feels she is doing well.   Current Medications (verified): 1)  Advair Diskus 250-50 Mcg/dose Misc (Fluticasone-Salmeterol) .Marland Kitchen.. 1 Inhalation Two Times A Day 2)  Meclizine Hcl 25 Mg Tabs (Meclizine Hcl) .... 1/2 To 1 By Mouth Two Times A Day As Needed For Dizziness 3)  Promethazine Hcl 25 Mg Tabs (Promethazine Hcl) .... Take 1 Tablet By Mouth Every 8 Hours As Needed Nausea and Vomiting 4)  Diovan Hct 160-25 Mg  Tabs (Valsartan-Hydrochlorothiazide) .... Take 1 Tablet By Mouth Once A Day 5)  Singulair 10 Mg Tabs (Montelukast Sodium) .Marland Kitchen.. 1 By Mouth Once Daily 6)  Colchicine 0.6 Mg  Tabs (Colchicine) .... Take 1  Tablet By Mouth Every 8 Hours As Needed Gout 7)  Cetirizine Hcl 10 Mg Tabs (Cetirizine Hcl) .Marland Kitchen.. 1 Tab By Mouth Daily 8)  Vitamin D3 1000 Unit Caps (Cholecalciferol) .... Take 1 Capsule By Mouth Once A Day 9)  Furosemide 20 Mg Tabs (Furosemide) .Marland Kitchen.. 1 Tab By Mouth in Morning. 10)  Potassium Chloride Cr 10 Meq Cr-Caps (Potassium Chloride) .Marland Kitchen.. 1 Tab By Mouth Daily On Days You Take Furosemide 11)  Ventolin Hfa 108 (90 Base) Mcg/act Aers (Albuterol Sulfate) .... 2 Puffs Every 4 Hours As Needed For Wheezing 12)  Travatan Z 0.004 % Soln (Travoprost) .... 2 Drops Each Eye Two Times A Day --Dr. Leonie Man 13)  Alphagan P 0.15 % Soln (Brimonidine Tartrate) .... 2 Drops Each Eye Two Times A Day Dr. Leonie Man 14)  Clobetasol Propionate E 0.05 % Crea (Clobetasol Prop Emollient Base) .... Apply Two Times A Day To Affected Areas As Needed  Allergies (verified): 1)  Sulfa  Physical Exam  General:  NAD Lungs:  Normal respiratory effort, chest expands symmetrically. Lungs are clear to auscultation, no crackles or wheezes. Heart:  Normal rate and regular rhythm. S1 and S2 normal without gallop, murmur, click, rub or other extra sounds.  Radial pulses normal. Extremities:  Mild edema.  Skin  with significant involvement with psoriasis--hyperpigmented lesions all over, but not inflammed as has been in past   Impression & Recommendations:  Problem # 1:  HYPERTENSION, ESSENTIAL NOS (ICD-401.9) Controlled Her updated medication list for this problem includes:    Diovan Hct 160-25 Mg Tabs (Valsartan-hydrochlorothiazide) .Marland Kitchen... Take 1 tablet by mouth once a day    Furosemide 20 Mg Tabs (Furosemide) .Marland Kitchen... 1 tab by mouth in morning.  Problem # 2:  ASTHMA (ICD-493.90) Controlled Given asthma action plan flu vaccine Her updated medication list for this problem includes:    Advair Diskus 250-50 Mcg/dose Misc (Fluticasone-salmeterol) .Marland Kitchen... 1 inhalation two times a day    Singulair 10 Mg Tabs (Montelukast sodium) .Marland Kitchen... 1 by mouth once daily    Ventolin Hfa 108 (90 Base) Mcg/act Aers (Albuterol sulfate) .Marland Kitchen... 2 puffs every 4 hours as needed for wheezing  Orders: Peak Flow Rate (94150) Pulse Oximetry (single measurment) (94760)  Problem # 3:  OSTEOARTHRITIS, KNEE, LEFT (ICD-715.96) Doing better with exercise  Problem # 4:  PSORIASIS (ICD-696.1) To see Dr. Allyson Sabal soon--fair control  Problem # 5:  MORBID OBESITY (ICD-278.01) To continue to work on lifestyle changes  Complete Medication List: 1)  Advair Diskus 250-50 Mcg/dose Misc (Fluticasone-salmeterol) .Marland Kitchen.. 1 inhalation two times a day 2)  Meclizine Hcl 25 Mg Tabs (Meclizine hcl) .... 1/2 to 1 by mouth two times a day as needed for dizziness 3)  Promethazine Hcl 25 Mg Tabs (Promethazine hcl) .... Take 1 tablet by mouth every 8 hours as needed nausea and vomiting 4)  Diovan Hct 160-25 Mg Tabs (Valsartan-hydrochlorothiazide) .... Take 1 tablet by mouth once a day 5)  Singulair 10 Mg Tabs (Montelukast sodium) .Marland Kitchen.. 1 by mouth once daily 6)  Colchicine 0.6 Mg Tabs (Colchicine) .... Take 1 tablet by mouth every 8 hours as needed gout 7)  Cetirizine Hcl 10 Mg Tabs (Cetirizine hcl) .Marland Kitchen.. 1 tab by mouth daily 8)  Vitamin D3 1000 Unit Caps  (Cholecalciferol) .... Take 1 capsule by mouth once a day 9)  Furosemide 20 Mg Tabs (Furosemide) .Marland Kitchen.. 1 tab by mouth in morning. 10)  Potassium Chloride Cr 10 Meq Cr-caps (Potassium chloride) .Marland Kitchen.. 1 tab by mouth daily on days you  take furosemide 11)  Ventolin Hfa 108 (90 Base) Mcg/act Aers (Albuterol sulfate) .... 2 puffs every 4 hours as needed for wheezing 12)  Travatan Z 0.004 % Soln (Travoprost) .... 2 drops each eye two times a day --dr. Leonie Man 13)  Alphagan P 0.15 % Soln (Brimonidine tartrate) .... 2 drops each eye two times a day dr. Leonie Man 14)  Clobetasol Propionate E 0.05 % Crea (Clobetasol prop emollient base) .... Apply two times a day to affected areas as needed  Other Orders: Flu Vaccine 16yr + ((26203 Admin 1st Vaccine ((55974  Asthma Management Plan    Personal best PEF: 400 liters/minute    Predicted PEF: 412 liters/minute    Working PEF: 400 liters/minute    Plan based on PEF formula: [place] [employer]: (Range: 320 to 400) ADVAIR DISKUS 250-50 MCG/DOSE MISC SINGULAIR 10 MG TABS VENTOLIN HFA 108 (90 BASE) MCG/ACT AERS:  2 puffs every 4 hours as needed  Yellow Zone: VENTOLIN HFA 108 (90 BASE) MCG/ACT AERS:  2 puffs every 3-4 hours (yellow zone) ADVAIR DISKUS 250-50 MCG/DOSE MISC SINGULAIR 10 MG TABS  Red Zone: Start Yellow Zone Meds first sign of cold/URI symptoms.   VENTOLIN HFA 108 (90 BASE) MCG/ACT AERS:  2 puffs every 20 minutes x 3 doses as needed for acute flare Call your physician for shortness of breath.   ADVAIR DISKUS 250-50 MCG/DOSE MISC SINGULAIR 10 MG TABS  Patient Instructions: 1)  Follow up with Dr. MAmil Amenin 6 months for CPP   Orders Added: 1)  Flu Vaccine 389yr+ [90658] 2)  Admin 1st Vaccine [90471] 3)  Peak Flow Rate [94150] 4)  Pulse Oximetry (single measurment) [94760] 5)  Est. Patient Level IV [9[16384] Immunizations Administered:  Influenza Vaccine # 1:    Vaccine Type: Fluvax 3+    Site: left deltoid    Mfr:  GlaxoSmithKline    Dose: 0.5 ml    Route: IM    Given by: RaAquilla SolianMA    Exp. Date: 03/12/2011    Lot #: [id number]  VIS given: 04/06/10 version given August 20, 2010.  Flu Vaccine Consent Questions:    Do you have a history of severe allergic reactions to this vaccine? no    Any prior history of allergic reactions to egg and/or gelatin? no    Do you have a sensitivity to the preservative Thimersol? no    Do you have a past history of Guillan-Barre Syndrome? no    Do you currently have an acute febrile illness? no    Have you ever had a severe reaction to latex? no    Vaccine information given and explained to patient? yes    Are you currently pregnant? no   Immunizations Administered:  Influenza Vaccine # 1:    Vaccine Type: Fluvax 3+    Site: left deltoid    Mfr: GlaxoSmithKline    Dose: 0.5 ml    Route: IM    Given by: RaAquilla SolianMA    Exp. Date: 03/12/2011    Lot #: [id number]  VIS given: 04/06/10 version given August 20, 2010.

## 2010-10-14 NOTE — Miscellaneous (Signed)
Summary: old record update  Clinical Lists Changes  Problems: Added new problem of OSTEOARTHRITIS, KNEE, LEFT (ICD-715.96) - injected by Dr. Micheline Chapman 09/28/09

## 2010-10-14 NOTE — Progress Notes (Signed)
Summary: REFERRAL TO DERMATOLOGY  Phone Note Call from Patient Call back at [phone]   Summary of Call: [place].  PLEASE CALL HER TO [phone] Initial call taken by: Trula Ore,  July 22, 2010 3:51 PM  Follow-up for Phone Call        Left message on answer machine for pt. to return call. Bridgett Larsson RN  July 27, 2010 8:48 AM     Additional Follow-up for Phone Call Additional follow up Details #1::        she needs der ref for psorasis on her legs and she says that now some warts have came on her behind and she wants to find out from them if they need to be removed. Additional Follow-up by: Roberto Scales,  July 27, 2010 9:54 AM    Additional Follow-up for Phone Call Additional follow up Details #2::    Alinda Sierras --please see order.  Mack Hook MD  July 30, 2010 11:46 AM   PT HAVE AN APPT 09-23-09 @ 10:00 AM ARRIVAL @ 9:45AM PT AWARE OF HER APPT.Marland KitchenMaren Reamer  August 02, 2010 9:12 AM

## 2010-10-14 NOTE — Assessment & Plan Note (Signed)
Summary: cpp exam///gk   Vital Signs:  Patient profile:   70 year old female Weight:      244 pounds Temp:     98.3 degrees F Pulse rate:   105 / minute Pulse rhythm:   regular Resp:     18 per minute BP sitting:   141 / 98  (left arm) Cuff size:   large  Vitals Entered By: Shellia Carwin CMA (February 18, 2010 8:58 AM) CC: CPP, Preventive Care Is Patient Diabetic? No Pain Assessment Patient in pain? yes     Location: left knee Intensity: 7  Does patient need assistance? Ambulation Normal   Primary Care Provider:  Mack Hook MD  CC:  CPP and Preventive Care.  History of Present Illness: 70 yo female here for CPP.  Concerns:    1.  Concern for weight:  Doesn't feel like she is doing anything to have increased weight gain.  Would like to speak with a nutritionist.  Trying to walk on the treadmill.  2.  Legs breaking out again.  Itching.  Led to cellulitis previously  3.  Hypertension:  did take bp meds today.  Has not missed.  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  Hemorrhoidectomy 2.  s/p total colectomy and  ileostomy  formation from [place] 3.  s/p back surgery 12/03/04;2000 EKG sinus arrhythmia, PAC's, PVC's 5/03 and 11/04 PFT's small airway obstruction 03-30-02  Family History: Mother died 75: unknown Father died 53:   lung problem/dialysis Sisters, 5:  3 died:  one from breast cance at age 57, the other from brain cancer.  Another sister died for unknown  cause in her 30s.1 sister is healthy.  Health of last one is unknown Brother, 2:  One died of unknown cancer.  Other brother 82 and healthy 5 children:  1 died of MI .  Another died from a brain tumor--age 75--different from one the pt's sister had.  Other 3 children are healthy  Social History: Single LIves alone Retired--previously ran a daycare. Never Smoked Alcohol use-no Drug use-no Drug Use:  never  Review of Systems General:  Energy good. Eyes:  Denies blurring; Dr. Leonie Man is eye doc. ENT:  Denies decreased hearing. CV:  Denies chest pain or discomfort and palpitations. Resp:  Denies shortness of breath; Has not needed rescue inhaler in months.. GI:  Denies abdominal pain, bloody stools, constipation, and dark tarry stools. GU:  Denies discharge and dysuria. MS:  Left knee arthritis. Derm:  pruritic rash on legs. Neuro:  Denies numbness, tingling, and weakness. Psych:  Denies anxiety, depression, and suicidal thoughts/plans.  Physical Exam  General:  Morbidly obese, NAD Head:  Normocephalic and atraumatic without obvious abnormalities. No apparent alopecia or balding. Eyes:  No corneal or conjunctival inflammation noted. EOMI. Perrla. Funduscopic exam benign, without hemorrhages, exudates or papilledema. Vision grossly normal. Ears:  External ear exam shows no significant lesions or deformities.  Otoscopic examination reveals clear canals, tympanic membranes are intact bilaterally without bulging, retraction, inflammation or discharge. Hearing is grossly normal bilaterally. Nose:  External nasal examination  shows no deformity or inflammation. Nasal mucosa are pink and moist without lesions or exudates. Mouth:  Oral mucosa and oropharynx without lesions or exudates.  dentures. Neck:  No deformities, masses, or tenderness noted. Breasts:  deferred to Dr. Delsa Sale Lungs:  Normal respiratory effort, chest expands symmetrically. Lungs are clear to auscultation, no crackles  or wheezes. Heart:  Normal rate and regular rhythm with frequent extrasystole and occasional pause. S1 and S2 normal without gallop,  click, rub.  Does have a Grade II/VI SEM along left sternal border radiating to right second interspace and carotids Abdomen:  Bowel sounds positive,abdomen soft and non-tender without masses, organomegaly or hernias noted.  Midline incisional scar.  right lower abdominal ileostomy Genitalia:  Deferred to Dr. Glennon Mac Laurance Flatten Msk:  Tender at medial aspect of right knee joint--bilateral knees appear hypertrophic Pulses:  R and L carotid,radial,femoral,dorsalis pedis and posterior tibial pulses are full and equal bilaterally Extremities:  MIld edema of ankles and feet about areas of rash Neurologic:  No cranial nerve deficits noted. Station and gait are normal. Plantar reflexes are down-going bilaterally. DTRs are symmetrical throughout. Sensory, motor and coordinative functions appear intact. Skin:  plaque like rash scattered over pretibial areas bilaterally for most part--some around to posterior aspect of lower legs Cervical Nodes:  No lymphadenopathy noted Axillary Nodes:  No palpable lymphadenopathy Inguinal Nodes:  No significant adenopathy Psych:  Cognition and judgment appear intact. Alert and cooperative with normal attention span and concentration. No apparent delusions, illusions, hallucinations   Impression & Recommendations:  Problem # 1:  HEALTH SCREENING (ICD-V70.0) Immunizations up to date. No need for colonoscopy Gynecologic care up to date  Problem # 2:  MORBID OBESITY (ICD-278.01) Encouraged pool exercise Orders: Nutrition Referral (Nutrition)  Problem # 3:  PREMATURE VENTRICULAR CONTRACTIONS (ICD-427.69) and atrial contractions--noted previously  Problem # 4:  CARDIAC MURMUR (ICD-785.2)  Orders: 2 D Echo (2 D Echo)  Problem # 5:  EDEMA (UVO-536.3) Restart as needed use of Lasix and KCl Control psoriasis Her updated medication list  for this problem includes:    Diovan Hct 160-25 Mg Tabs (Valsartan-hydrochlorothiazide) .Marland Kitchen... Take 1 tablet by mouth once a day    Furosemide 20 Mg Tabs (Furosemide) .Marland Kitchen... 1 tab by mouth in morning.  Problem # 6:  HYPERTENSION, ESSENTIAL NOS (ICD-401.9) A bit elevated--exercise and weight loss Recheck at later date Her updated medication list for this problem includes:    Diovan Hct 160-25 Mg Tabs (Valsartan-hydrochlorothiazide) .Marland Kitchen... Take 1 tablet by mouth once a day    Furosemide 20 Mg Tabs (Furosemide) .Marland Kitchen... 1 tab by mouth in morning.  Orders: T-Comprehensive Metabolic Panel ([id number]) T-CBC w/Diff (95638-75643) UA Dipstick w/o Micro (manual) (32951) Nutrition Referral (Nutrition)  Problem # 7:  ASTHMA (ICD-493.90) Controlled Her updated medication list for this problem includes:    Advair Diskus 250-50 Mcg/dose Misc (Fluticasone-salmeterol) .Marland Kitchen... 1 inhalation two times a day    Singulair 10 Mg Tabs (Montelukast sodium) .Marland Kitchen... 1 by mouth once daily    Ventolin Hfa 108 (90 Base) Mcg/act Aers (Albuterol sulfate) .Marland Kitchen... 2 puffs every 4 hours as needed for wheezing  Problem # 8:  PSORIASIS (ICD-696.1) Restart Clobetasol--pt. previously followed by Dr. Corlis Leak Clobetasol only thing that helped. Has a history of biopsy of leg rash confirming psoriasis  Complete Medication List: 1)  Advair Diskus 250-50 Mcg/dose Misc (Fluticasone-salmeterol) .Marland Kitchen.. 1 inhalation two times a day 2)  Meclizine Hcl 25 Mg Tabs (Meclizine hcl) .... 1/2 to 1 by mouth two times a day  as needed for dizziness 3)  Promethazine Hcl 25 Mg Tabs (Promethazine hcl) .... Take 1 tablet by mouth every 8 hours as needed nausea and vomiting 4)  Diovan Hct 160-25 Mg Tabs (Valsartan-hydrochlorothiazide) .... Take 1 tablet by mouth once a day 5)  Singulair 10 Mg Tabs (Montelukast sodium) .Marland Kitchen.. 1 by mouth once daily 6)  Colchicine 0.6 Mg Tabs (Colchicine) .... Take 1 tablet by mouth every 8 hours as needed gout 7)   Cetirizine Hcl 10 Mg Tabs (Cetirizine hcl) .Marland Kitchen.. 1 tab by mouth daily 8)  Vitamin D3 1000 Unit Caps (Cholecalciferol) .... Take 1 capsule by mouth once a day 9)  Furosemide 20 Mg Tabs (Furosemide) .Marland Kitchen.. 1 tab by mouth in morning. 10)  Potassium Chloride Cr 10 Meq Cr-caps (Potassium chloride) .Marland Kitchen.. 1 tab by mouth daily on days you take furosemide 11)  Ventolin Hfa 108 (90 Base) Mcg/act Aers (Albuterol sulfate) .... 2 puffs every 4 hours as needed for wheezing 12)  Travatan Z 0.004 % Soln (Travoprost) .... 2 drops each eye two times a day --dr. Leonie Man 13)  Alphagan P 0.15 % Soln (Brimonidine tartrate) .... 2 drops each eye two times a day dr. Leonie Man 14)  Clobetasol Propionate E 0.05 % Crea (Clobetasol prop emollient base) .... Apply two times a day to affected areas as needed  Other Orders: T-Lipid Profile ([id number])  Patient Instructions: 1)  BP check--nurse visit in 2 weeks--take your meds 2)  Follow up with Dr. Amil Amen in 6 months for asthma, htn, psoriasis Prescriptions: VENTOLIN HFA 108 (90 BASE) MCG/ACT AERS (ALBUTEROL SULFATE) 2 puffs every 4 hours as needed for wheezing  #1 x 0   Entered and Authorized by:   Mack Hook MD   Signed by:   Mack Hook MD on 02/18/2010   Method used:   Electronically to        [facility] (retail)       [place], [place]  [address]       Ph: [phone]       Fax: [phone]   RxID:   [id number] POTASSIUM CHLORIDE CR 10 MEQ CR-CAPS (POTASSIUM CHLORIDE) 1 tab by mouth daily on days you take Furosemide  #30 x 1   Entered and Authorized by:   Mack Hook MD   Signed by:   Mack Hook MD on 02/18/2010   Method used:   Electronically to        [facility] (retail)       [place], [place]  [address]       Ph: [phone]       Fax: [phone]   RxID:   [id number] FUROSEMIDE 20 MG TABS (FUROSEMIDE) 1  tab by mouth in morning.  #30 x 1   Entered and Authorized by:   Mack Hook MD   Signed by:   Mack Hook MD on 02/18/2010   Method used:   Electronically to        [facility] (retail)       [address] [place],   [address]       Ph: [phone]       Fax: [phone]  RxID:   [id number] CETIRIZINE HCL 10 MG TABS (CETIRIZINE HCL) 1 tab by mouth daily  #30 x 11   Entered and Authorized by:   Mack Hook MD   Signed by:   Mack Hook MD on 02/18/2010   Method used:   Electronically to        [facility] (retail)       [address]       [place], [place]  [address]       Ph: [phone]       Fax: [phone]   RxID:   [id number] SINGULAIR 10 MG TABS (MONTELUKAST SODIUM) 1 by mouth once daily  #30 Tablet x 11   Entered and Authorized by:   Mack Hook MD   Signed by:   Mack Hook MD on 02/18/2010   Method used:   Electronically to        [facility] (retail)       [address]       [place], [place]  [address]       Ph: [phone]       Fax: [phone]   RxID:   [id number] DIOVAN HCT 160-25 MG  TABS (VALSARTAN-HYDROCHLOROTHIAZIDE) Take 1 tablet by mouth once a day  #30 Tablet x 11   Entered and Authorized by:   Mack Hook MD   Signed by:   Mack Hook MD on 02/18/2010   Method used:   Electronically to        [facility] (retail)       [address]       [place], [place]  [address]       Ph: [phone]       Fax: [phone]   RxID:   [id number] ADVAIR DISKUS 250-50 MCG/DOSE MISC (FLUTICASONE-SALMETEROL) 1 inhalation two times a day  #60 x 11   Entered and Authorized by:   Mack Hook MD   Signed by:   Mack Hook MD on 02/18/2010   Method used:   Electronically to        [facility] (retail)        [address]       [place], [place]  [address]       Ph: [phone]       Fax: [phone]   RxID:   [id number] CLOBETASOL PROPIONATE E 0.05 % CREA (CLOBETASOL PROP EMOLLIENT BASE) apply two times a day to affected areas as needed  #60 g x 4   Entered and Authorized by:   Mack Hook MD   Signed by:   Mack Hook MD on 02/18/2010   Method used:   Electronically to        [facility] (retail)       [address]       [place], [place]  [address]       Ph: [phone]       Fax: [phone]   RxID:   [id number]      Preventive Care Screening  Pap Smear:    Date:  01/08/2010    Results:  normal-per pt--Dr. Delsa Sale  at [facility]  Mammogram:    Date:  12/25/2009    Results:  normal-per pt.   Prior Values:    Mammogram:  normal([facility]) (12/24/2008)    Last Flu Shot:  Fluvax 3+ (07/30/2009)    Last Pneumovax:  Historical (10/13/2005)     Mammogram:  Pt. states through [facility] on 12/25/09 and ordered through [facility]:  normal per pt. Pap:  01/08/10 with Dr. Delsa Sale SBE:  does perform--no changes Osteoprevention:  Not much in dairy.  Takes Vitamin D.  Was told she had too much calcium by Dr. Delsa Sale previously.  Trying to exercise. Guaiac Cards/Colonoscopy:  Pt. with history of total colectomy and ileostomy formation for UC back in 1977.     Tetanus/Td Immunization History:    Tetanus/Td # 1:  Td (04/26/2004)  Laboratory Results   Urine Tests    Routine Urinalysis   Glucose: negative   (Normal Range: Negative) Bilirubin: negative   (Normal Range: Negative) Ketone: negative   (Normal Range: Negative) Spec. Gravity: 1.010   (Normal Range: 1.003-1.035) Blood: negative   (Normal Range: Negative) pH: 6.0   (Normal Range: 5.0-8.0) Protein: negative   (Normal Range: Negative) Urobilinogen: 0.2   (Normal Range:  0-1) Nitrite: negative   (Normal Range: Negative) Leukocyte Esterace: negative   (Normal Range: Negative)

## 2010-10-14 NOTE — Progress Notes (Signed)
Summary: NEEDS WRITTEN ORDER  Phone Note Other Incoming   Request: Send information Summary of Call: JANET WITH [employer] CALLED AND SAYS THAT SHE NEEDS AN ORDER WRITTEN EITHER ON A RX PAD AOT LETTER HEAD AS TO WHY [facility] HAS EXTRA OSTOMY SUPPLIES.  JANET SAYS SHE KNOWS THAT SHE HAS FREQUENT BOWEL MOVEMENTS. WE CAN FAX THE ORDER TO [phone] AND HER NUMBER IS [phone] Initial call taken by: Roberto Scales,  November 18, 2009 12:49 PM  Follow-up for Phone Call        Fwd to Dr. Amil Amen to see if letter can be done for pt's ostomy supplies due to frequent bowel movements. Follow-up by: Shellia Carwin CMA,  November 18, 2009 12:52 PM  Additional Follow-up for Phone Call Additional follow up Details #1::        Note done--please send  Additional Follow-up by: Mack Hook MD,  November 21, 2009 3:05 PM    Additional Follow-up for Phone Call Additional follow up Details #2::    Letter faxed to [employer] Follow-up by: Shellia Carwin CMA,  November 23, 2009 11:28 AM

## 2010-12-15 LAB — CBC
HCT: 42 % (ref 36.0–46.0)
Hemoglobin: 14.2 g/dL (ref 12.0–15.0)
MCHC: 33.8 g/dL (ref 30.0–36.0)
MCV: 94.6 fL (ref 78.0–100.0)
Platelets: 210 10*3/uL (ref 150–400)
RBC: 4.44 MIL/uL (ref 3.87–5.11)
RDW: 13.8 % (ref 11.5–15.5)
WBC: 9.2 10*3/uL (ref 4.0–10.5)

## 2010-12-15 LAB — BASIC METABOLIC PANEL
BUN: 17 mg/dL (ref 6–23)
CO2: 25 mEq/L (ref 19–32)
Calcium: 8.4 mg/dL (ref 8.4–10.5)
Chloride: 107 mEq/L (ref 96–112)
Creatinine, Ser: 1.8 mg/dL — ABNORMAL HIGH (ref 0.4–1.2)
GFR calc Af Amer: 34 mL/min — ABNORMAL LOW (ref >60)
GFR calc non Af Amer: 28 mL/min — ABNORMAL LOW (ref >60)
Glucose, Bld: 120 mg/dL — ABNORMAL HIGH (ref 70–99)
Potassium: 3.1 mEq/L — ABNORMAL LOW (ref 3.5–5.1)
Sodium: 140 mEq/L (ref 135–145)

## 2010-12-15 LAB — DIFFERENTIAL
Basophils Absolute: 0 10*3/uL (ref 0.0–0.1)
Basophils Relative: 0 % (ref 0–1)
Eosinophils Absolute: 0.1 10*3/uL (ref 0.0–0.7)
Eosinophils Relative: 2 % (ref 0–5)
Lymphocytes Relative: 50 % — ABNORMAL HIGH (ref 12–46)
Lymphs Abs: 4.6 10*3/uL — ABNORMAL HIGH (ref 0.7–4.0)
Monocytes Absolute: 0.9 10*3/uL (ref 0.1–1.0)
Monocytes Relative: 9 % (ref 3–12)
Neutro Abs: 3.6 10*3/uL (ref 1.7–7.7)
Neutrophils Relative %: 39 % — ABNORMAL LOW (ref 43–77)

## 2010-12-27 LAB — CBC
HCT: 42 % (ref 36.0–46.0)
Hemoglobin: 14.1 g/dL (ref 12.0–15.0)
MCHC: 33.7 g/dL (ref 30.0–36.0)
MCV: 94 fL (ref 78.0–100.0)
Platelets: 207 10*3/uL (ref 150–400)
RBC: 4.46 MIL/uL (ref 3.87–5.11)
RDW: 13.7 % (ref 11.5–15.5)
WBC: 7.8 10*3/uL (ref 4.0–10.5)

## 2011-01-25 NOTE — Op Note (Signed)
Jackie Parker, Jackie Parker            ACCOUNT NO.:  [id number]   MEDICAL RECORD NO.:  [id number]          PATIENT TYPE:  AMB   LOCATION:  [place]                           FACILITY:  [facility]   PHYSICIAN:  Agnes Lawrence, M.D.DATE OF BIRTH:  Jan 25, 1941   DATE OF PROCEDURE:  09/30/2008  DATE OF DISCHARGE:                               OPERATIVE REPORT   PREOPERATIVE DIAGNOSIS:  Postmenopausal bleeding.   POSTOPERATIVE DIAGNOSIS:  Postmenopausal bleeding.   PROCEDURE:  Dilatation and curettage, diagnostic hysteroscopy, Mirena  intrauterine device insertion.   SURGEON:  Agnes Lawrence, MD   ANESTHESIA:  Laryngeal mask airway, paracervical block.   FINDINGS:  Upon hysteroscopic survey of the uterine cavity, there were  no discrete lesions noted.  There was some fluffy endometrium noted near  the fundus as well as likely synechiae in the fundal region.   PATHOLOGY:  Endometrial curettings.   ESTIMATED BLOOD LOSS:  Minimal.   COMPLICATIONS:  None.   PROCEDURE:  The patient was taken to the operating room with an IV  running.  She was placed in the dorsal lithotomy position and given a  laryngeal mask airway and prepped and draped in the usual sterile  fashion.  After time-out had been completed, a sterile speculum was  placed in the patient's vagina.  2 mL of 1% Xylocaine were injected at  12 o'clock on the cervix.  The single-tooth tenaculum was then applied  to this location.  4 mL of 1% Xylocaine were then injected at 4 and 7  o'clock to produce a paracervical block.  The cervix was dilated with  Kennon Rounds dilators.  The hysteroscope was then introduced with the above  findings noted.  The hysteroscope was then removed.  A sharp curettage  was then performed.  The uterus sounded to 8 cm.  The Mirena IUD was  then placed without difficulty.  The strings were trimmed.  The single-  tooth tenaculum was removed with minimal bleeding noted from the cervix.  At the close of the  procedure, the instrument and pack counts were said  to be correct x2.  The patient was taken to the PACU awake and in stable  condition.      Agnes Lawrence, M.D.  Electronically Signed     LAJ/MEDQ  D:  09/30/2008  T:  10/01/2008  Job:  [id number]

## 2011-01-25 NOTE — H&P (Signed)
Jackie Parker, Jackie Parker NO.:  [id number]   MEDICAL RECORD NO.:  [id number]          PATIENT TYPE:  INP   LOCATION:  0103                         FACILITY[facility]   PHYSICIAN:  Rise Patience, MDDATE OF BIRTH:  Apr 03, 1941   DATE OF ADMISSION:  05/19/2008  DATE OF DISCHARGE:                              HISTORY & PHYSICAL   PRIMARY CARE PHYSICIAN:  Unassigned.   CHIEF COMPLAINT:  Abdominal pain and chest pain.   HISTORY OF PRESENT ILLNESS:  This is a 70 year old female with history  of hypertension and obesity, brought to the ER and presented with  complaint of right upper quadrant pain.  The patient states she has been  having this pain for the last 1-2 weeks and she also noticed that a week  ago, almost for 4 days she had constant chest pain which was more on the  right side, nonradiating, dull aching, not related to activity.  The  patient denies any associated shortness of breath.  Subsequently the  pain became more in the right upper quadrant, dull aching, radiating to  the back.   The patient had a CT of the abdomen and pelvis and the chest, which  demonstrated acute findings.  The patient has been admitted for further  observation to rule out any ACS.  The patient denies any dizziness, loss  of consciousness, weakness of limbs, nausea, vomiting, diarrhea, fever  or chills, or headache.   PAST MEDICAL HISTORY:  Hypertension, bronchial asthma, obesity.   MEDICATIONS:  Per admission, Advair 250/50 1 puff twice a day, Singulair  10 mg p.o. daily, Zyrtec 10 mg p.o. daily, hydrochlorothiazide 10 mg  daily, Diovan, Meclizine, Advil as needed.   ALLERGIES:  SULFA.   FAMILY HISTORY:  Significant for breast cancer.  The patient has been  advised to have breast cancer screening.   SOCIAL HISTORY:  The patient denies smoking, does not drink any alcohol,  or use any illegal drugs.   REVIEW OF SYSTEMS:  As presented in the history of present illness.  Nothing of significance.   PHYSICAL EXAMINATION:  The patient examined at bedside.  Not in acute  distress.  Denies any chest pain now.  VITAL SIGNS:  Blood pressure is 140/80.  Pulse 80 per minute.  Temperature 97.2.  Respiratory rate 18 per minute.  O2 sat 99%.  HEENT:  Anicteric.  No pallor.  CHEST:  Bilateral air entry present.  No rhonchi, no crepitation.  HEART:  S1 and S2.  ABDOMEN:  Soft, nontender.  Bowel sounds heard.  No guarding, no  rigidity.  CNS:  Alert, awake, oriented to time, place and person.  Both upper and  lower limbs 5/5.  EXTREMITIES:  Peripheral pulses positive, no edema.   LABS:  EKG normal sinus rhythm with nonspecific T-wave changes in V1 and  V2.  CT of the abdomen and pelvis and CT angio of the chest, nothing  acute.  I advised the patient that there is enlargement of the uterus  and she needs to follow on this as an outpatient, for which she has  agreed.  CBC, WBC 8.9.  Hemoglobin 15.6.  Hematocrit 46.  Platelets 209.  PT and INR 3.5 and 1.  Complete metabolic panel, sodium 291, potassium  3.8, chloride 108, carbon dioxide 27, glucose 77, BUN 11, creatinine  1.1.  AST 64, ALT 30.  Calcium 8.9.  Magnesium 1.7.  CK MB 3.  Troponin  I less than 0.05.  UA is negative for WBC and RBCs, blood, ketones.   ASSESSMENT:  1. Abdominal pain with recent chest pain to rule out acute coronary      syndrome.  2. Hypertension.  3. History of asthma.   PLAN:  Admit to telemetry.  Will cycle cardiac markers, get a two-  dimensional echocardiogram.  I have advised the patient that she will  need further workup on her enlarged uterus as outpatient, for which she  has agreed.      Rise Patience, MD  Electronically Signed     ANK/MEDQ  D:  05/19/2008  T:  05/20/2008  Job:  [id number]

## 2011-01-28 NOTE — Op Note (Signed)
[facility]  Patient:    Jackie Parker, Jackie Parker Visit Number: [id number] MRN: [id number]          Service Type: DSU Location: [facility] Attending Physician:  Lahoma Crocker Proc. Date: 05/07/01 Adm. Date:  05/07/2001   CC:         [facility], [facility]   Operative Report  PREOPERATIVE DIAGNOSIS:       Postmenopausal uterine bleeding, rule out endometrial polyp.  POSTOPERATIVE DIAGNOSIS:      Postmenopausal abnormal uterine bleeding.  PROCEDURES:                   1. Dilation and curettage.                               2. Hysteroscopy.  SURGEON:                      Agnes Lawrence, M.D.  ANESTHESIA:                   Laryngeal mask airway.  COMPLICATIONS:                None.  ESTIMATED BLOOD LOSS:         Approximately 50 cc.  FINDINGS:                     Overall, the endometrium appeared atrophic, except for two polypoid-like structures in the lower uterine segment that were felt to likely be polypoid-like endoemetrium as opposed to focal polyps.  At the time of D&C, there was the suggestion of a submucous myoma posteriorly.  DESCRIPTION OF PROCEDURE:     The patient was taken to the operating room.  A weighted speculum was placed in the patients vagina.  The cervix and vagina had been swabbed with Betadine.  A single-tooth tenaculum was applied to the anterior lip of the cervix.  The cervix was then dilated with [facility] dilators. The hysteroscope was then advanced gently into the uterine fundus.  The above findings were noted.  Sharp curettage was then performed until a gritty texture was noted.  An attempt was again made to visualize the endometrial cavity.  However, this follow-up survey was limited by obscuring blood.  The hysteroscope was remove.  The single-tooth tenaculum was removed with good hemostasis noted.  The patient tolerated the procedure well.  The patient was taken to the PACU in stable  condition.  PATHOLOGY:                    Endometrial curettings. Attending Physician:  Lahoma Crocker DD:  05/07/01 TD:  05/08/01 Job: [id number]

## 2011-01-28 NOTE — H&P (Signed)
NAMEARDELLE, HALIBURTON Jackie.:  [id number]   MEDICAL RECORD Jackie.:  [id number]                   PATIENT TYPE:  OUT   LOCATION:  ULT                                  FACILITY:  [facility]   PHYSICIAN:  Agnes Lawrence, M.D.         DATE OF BIRTH:  21-Jul-1941   DATE OF ADMISSION:  DATE OF DISCHARGE:                                HISTORY & PHYSICAL   CHIEF COMPLAINT:  The patient is a 70 year old African-American female with  postmenopausal bleeding who presents for D&C, hysteroscopy.   HISTORY OF PRESENT ILLNESS:  The patient has a several-year history of  postmenopausal bleeding episodes.  She has been diagnosed with endometrial  polyps and simple hyperplasia.  She has had multiple D&C's and  hysteroscopies with removal of benign polyps.  Most recently in 7/03 she  underwent a D&C, hysteroscopy with removal of a benign endometrial polyp  with focal hyperplasia without atypia, the curettings demonstrated benign  proliferative endometrium without any hyperplasia or malignancy.  At this  point a cryoablation was performed.  She did well until approximately one  month ago when she had an episode of bleeding.  Recent work up has included  two attempts at an endometrial biopsy in the office that failed to retrieve  any endometrial tissue.  She has had an ultrasound and has had a hysterogram  that shows diffuse thickening of the endometrial stripe to approximately one  cm. A Pap smear from June demonstrated ASCUS, however, there were Jackie high  risk HPV detected.  The patient does have a history of small uterine  fibroids.   PAST OB/GYN HISTORY:  As above.  She has five living children.   PAST MEDICAL HISTORY:  Crohn's disease.   PAST SURGICAL HISTORY:  Ileostomy and back surgery.   SOCIAL HISTORY:  She is single.  She is disabled. She denies tobacco use,  alcohol use or illicit drug use.   FAMILY HISTORY:  Brain tumor.   REVIEW OF SYSTEMS:  Relates to her  history of present illness.   ALLERGIES:  SULFA.   MEDICATIONS:  Hydrochlorothiazide, Lotensin.   PHYSICAL EXAMINATION:  VITAL SIGNS: Height 5 feet, 2 inches.  Weight 223  pounds.  Blood pressure 108/80, pulse 72, temperature 97.3.  GENERAL: Slightly overweight African-American female, appears Parker age.  Jackie apparent distress.  LUNGS: Clear to auscultation bilaterally.  HEART: Regular rate and rhythm.  ABDOMEN: Soft, nontender.  PELVIC EXAM: The external female genitalia is consistent with atrophic  changes. Speculum exam of the vagina is clean.  Bimanual exam omitted by the  patient's body habitus.  However the uterus is small, anteverted, nontender.  The adnexae are nonpalpable, nontender bilaterally.  EXTREMITIES: Jackie clubbing, cyanosis or edema.  SKIN: Without rash.   ASSESSMENT:  Postmenopausal bleeding now status post cryoablation, rule out  endometrial hyperplasia.   PLAN:  Dilation and curettage, hysteroscopy.  Agnes Lawrence, M.D.    Howell Pringle  D:  12/16/2002  T:  12/16/2002  Job:  [id number]

## 2011-01-28 NOTE — H&P (Signed)
[facility]  Patient:    Jackie Parker, Jackie Parker                     MRN: [id number] Adm. Date:  [id number] Disc. Date: [id number] Attending:  Alvino Chapel CC:         Dr. Milagros Evener at [facility]   History and Physical  CHIEF COMPLAINT:              The patient is a 70 year old with rule out endometrial hyperplasia versus polyp refractory to attempts at medical management.  HISTORY OF PRESENT ILLNESS:   The patient has a long history of abnormal uterine bleeding. She has had multiple endometrial biopsies and a D&C/hysteroscopy in the past that demonstrated simple hyperplasia confined to an endometrial polyp. She is status post two Depo-Provera injections over the past several months and continues to have abnormal uterine bleeding which she characterizes as spotting.  ALLERGIES:                    SULFA.  MEDICATIONS:                  Hydrochlorothiazide.  PAST OB/GYN HISTORY:          She is status post five spontaneous vaginal deliveries.  PAST MEDICAL HISTORY:         Chronic hypertension, ulcerative colitis.  PAST SURGICAL HISTORY:        She is status post an ileostomy and back surgery, and as above.  SOCIAL HISTORY:               She denies any tobacco, ethanol, or drug use.  FAMILY HISTORY:               Brain tumors.  PHYSICAL EXAMINATION:  VITAL SIGNS:                  Pulse 108, blood pressure 156/90, weight 234.9 pounds.  GENERAL:                      Obese in no apparent distress.  HEENT:                        Normocephalic, atraumatic.  NECK:                         Supple.  LUNGS:                        Clear to auscultation bilaterally.  HEART:                        Regular rate and rhythm.  ABDOMEN:                      Obese, nontender.  PELVIC:                       Labia majora are thin and red appearing. On speculum exam the vagina is clean. The bimanual exam is limited secondary to patients body  habitus. The uterus was felt to be axial in position, normal size and nontender. The adnexa were nonpalpable, nontender.  SKIN:                         No rash.  ASSESSMENT:  Postmenopausal bleeding, rule out endometrial polyp versus neoplastic process.  PLANNED PROCEDURE:            D&C/hysteroscopy. DD:  04/17/01 TD:  04/18/01 Job: [id number]

## 2011-01-28 NOTE — Op Note (Signed)
[facility]  Patient:    Jackie Parker, Jackie Parker                     MRN: [id number] Proc. Date: 07/31/00 Adm. Date:  [id number] Disc. Date: [id number] Attending:  Lahoma Crocker CC:         [facility]   Operative Report  PREOPERATIVE DIAGNOSIS:       Rule out endometrial polyp.  POSTOPERATIVE DIAGNOSIS:      Rule out endometrial polyp.  OPERATION:                    Diagnostic hysteroscopy and D&C with removal of                               an endometrial polyp.  SURGEON:                      Agnes Lawrence, M.D.  ANESTHESIA:                   Paracervical block and managed anesthesia care.  COMPLICATIONS:                None.  ESTIMATED BLOOD LOSS:         50 cc.  FINDINGS:                     An anteverted uterus, upper limits of normal in size.  Upon the initial inspection of the endometrial cavity, there was a large polyp protruding almost down to the level of the internal os.  After removal of this polyp, the tubal ostial were not well-visualized.  The endometrium otherwise appeared thin.  DESCRIPTION OF PROCEDURE:     The patient was taken to the operating room. She was placed in the dorsolithotomy position, and prepped and draped in the usual sterile fashion.  Two cubic centimeters of lidocaine were injected into the anterior lip of the cervix.  A single tooth tenaculum was then applied to this location and 5 cc of lidocaine were injected at 4 and 7 oclock to produce a paracervical block.  The cervix was then dilated with Kennon Rounds dilators and the hysteroscope was then advanced into the cervical canal and then into the lower uterine segment using _______ and distending medium.  The above findings were noted and the hysteroscope was removed.  In using polyp forceps, the polyp was extracted.  The hysteroscope was then reintroduced and there was a minimal portion of polyp stalk that was remaining.  The  uterus was then sounded to 8 cm and the uterine cavity was then sharply curetted.  All the instruments were then removed from the vagina with minimal bleeding noted from the cervix.  The patient tolerated the procedure well.  The patient was taken to the PACU in stable condition.  PATHOLOGY:                    Endometrial polyp and endometrial curettings. DD:  08/06/00 TD:  08/06/00 Job: [id number]

## 2011-01-28 NOTE — Op Note (Signed)
NAMEGRACIELLA, Jackie Parker                        ACCOUNT NO.:  [id number]   MEDICAL RECORD NO.:  [id number]                   PATIENT TYPE:  AMB   LOCATION:  SDC                                  [facility]ILITY:  [facility]   PHYSICIAN:  Agnes Lawrence, M.D.         DATE OF BIRTH:  April 06, 1941   DATE OF PROCEDURE:  12/27/2002  DATE OF DISCHARGE:                                 OPERATIVE REPORT   PREOPERATIVE DIAGNOSIS:  Postmenopausal bleeding.   POSTOPERATIVE DIAGNOSIS:  Postmenopausal bleeding.   PROCEDURE:  Dilatation and curettage, hysteroscopy with ultrasound guidance,  and insertion of Mirena intrauterine device.   SURGEON:  Agnes Lawrence, M.D.   ANESTHESIA:  Managed anesthesia care with subsequent laryngeal mask airway  placement.   COMPLICATIONS:  None.   ESTIMATED BLOOD LOSS:  Less than 50 mL.   FINDINGS:  Normal-sized anteverted uterus.  Scant endometrial curettings.  Initially after the cervix had been dilated with Kennon Rounds dilators, an attempt  was made to introduce the hysteroscope; however, it could not be advanced  into the uterine fundus.  At this point an ultrasonographer was able to  provide guidance for subsequent dilatation.  Under direct visualization the  uterus was sounded to 9 cm and was noted to be in the uterine fundus.  The  hysteroscopic survey was limited somewhat by obscuring blood; however,  overall the lining appeared atrophic.  However, there was slightly lush-  appearing endometrium in the posterior fundus.  No discrete lesions were  noted.   DESCRIPTION OF PROCEDURE:  The patient was taken to the operating room.  A  sterile speculum was placed in the patient's vagina.  Lidocaine 2 mL were  injected into the anterior lip of the cervix.  The single-tooth tenaculum  was then applied to this location and 5 mL of lidocaine were injected at 4  and 7 o'clock, which produced a paracervical block.  During the procedure,  please note the patient became  uncomfortable and the block was repeated.  An  initial attempt was then made to dilate the cervical canal with [facility]  dilators.  Again an initial attempt was used to introduce the hysteroscope  into the uterine cavity using glycine as the distending medium.  This was  repeated.  The uterus was then dilated and sounded under ultrasound  guidance.  The hysteroscope was then introduced into the cervical canal and  then advanced to the uterine fundus without difficulty with the above  findings noted.  The hysteroscope was then removed.  A 200 mL deficit was  noted.  A sharp curettage was then performed.  There was a gritty texture  noted anteriorly.  There were scant to moderate endometrial curettings  retrieved.  At this point the Mirena IUD was placed without IUD and the  strings trimmed.  The tenaculum was then removed with good hemostasis noted.  The patient tolerated the procedure well.  The patient was taken to the  PACU  in stable condition.   PATHOLOGY:  Endometrial curettings.                                              Agnes Lawrence, M.D.   Howell Pringle  D:  12/27/2002  T:  12/28/2002  Job:  [id number]

## 2011-01-28 NOTE — H&P (Signed)
[facility]  Patient:    Jackie Parker, Jackie Parker Visit Number: [id number] MRN: [id number]          Service Type: Attending:  Agnes Lawrence, M.D. Dictated by:   Agnes Lawrence, M.D.   CC:         Dr. Zadie Rhine, [facility], [place]   History and Physical  CHIEF COMPLAINT:              The patient is a 70 year old African-American female with an endometrial polyp who presents for operative hysteroscopy with possible resectoscope and possible cryoablation.  HISTORY OF PRESENT ILLNESS:   The patient has a several-year history of postmenopausal bleeding episodes.  She has been diagnosed with endometrial polyps and simple hyperplasia in the past.  She has had D&Cs and hysteroscopies with removal of benign polyp.  She has been treated medically with Megace and Depo-Provera in the past.  She is not currently on any hormone replacement therapy.  Recent workup includes repeat endometrial biopsy that has failed to demonstrate any neoplastic process.  She has had pelvic ultrasounds and sonohistograms which demonstrated endometrial polyp attached to the posterior left fundal surface of the endometrial cavity measuring 2.6 cm in diameter.  A Pap smear taken in June demonstrated ascus, and there was no high-risk HPV detected.  OBSTETRICAL HISTORY AND GYNECOLOGICAL HISTORY:        As above.  She has five living children.  PAST MEDICAL HISTORY:         1. Inflammatory bowel disease.                               2. Crohns disease.  PAST SURGICAL HISTORY:        1. Status post ileostomy.                               2. Back surgery.  SOCIAL HISTORY:               Single.  She is disabled.  Tobacco use she denies.  Alcohol use she denies.  Illicit drug use she denies.  FAMILY HISTORY:               Brain tumor.  REVIEW OF SYSTEMS:            Relates to her history of present illness.  ALLERGIES:                     SULFA.  MEDICATIONS:                  Hydrochlorothiazide.  Lotensin.  PHYSICAL EXAMINATION:  VITAL SIGNS:                  Height 5 feet 2 inches, weight 229 pounds. Temperature 97.4, pulse 68, blood pressure 150/90, respirations 16.  GENERAL:                      Well-nourished, well-developed female in no apparent distress.  LUNGS:                        Clear to auscultation bilaterally.  HEART:                        Regular  rate and rhythm.  ABDOMEN:                      Soft and nontender.  Ileostomy noted.  PELVIC:                       External female genitalia and vagina demonstrate atrophic changes.  Cervix is without visible lesions.  On bimanual exam the uterus is nontender, normal size, shape, and consistency.  Adnexa with no masses.  Nontender bilaterally.  ASSESSMENT:                   Postmenopausal, endometrial polyp.  PLAN:                         Operative hysteroscopy with possible resectoscope, D&C, possible laparoscopy, possible endometrial cryoablation. Dictated by:   Agnes Lawrence, M.D. Attending:  Agnes Lawrence, M.D. DD:  03/14/02 TD:  03/18/02 Job: [id number]

## 2011-01-28 NOTE — Op Note (Signed)
NAMESHARIA, AVERITT                        ACCOUNT NO.:  [id number]   MEDICAL RECORD NO.:  [id number]                   PATIENT TYPE:  INP   LOCATION:  3024                                 FACILITY:  [facility]   PHYSICIAN:  Marchia Meiers. Vertell Limber, M.D.               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   PROCEDURE:  Anterior cervical decompression and fusion C4-C5 and C5-C6  levels with allograft bone graft and anterior cervical plates.   SURGEON:  Marchia Meiers. Vertell Limber, M.D.   ASSISTANT:  Ophelia Charter, M.D.   ANESTHESIA:  General endotracheal anesthesia.   ESTIMATED BLOOD LOSS:  200 mL.   COMPLICATIONS:  None.   DISPOSITION:  Recovery room.   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  She has a large ventral osteophyte at the C4-C5 level.  It was elected to  take her to surgery for anterior cervical decompression and fusion at the C4-  C5 and C5-C6 levels.   PROCEDURE:  Ms. Taite was brought to the operating room.  Following  satisfactory uncomplicated induction of general endotracheal anesthesia and  placement of intravenous  lines, the patient was placed in a supine position  on the operating table.  Her neck was placed in slight extension.  She was  placed in 10 pounds of halter traction.  Her  anterior neck was then prepped  and draped in the usual sterile fashion.  The area of planned incision was  infiltrated with 0.25% Marcaine and 0.5% lidocaine with 1:200,000  epinephrine.  An incision was made from the midline to the anterior border  of the sternocleidomastoid muscle and carried sharply through the platysmal  layer.  Subplatysmal dissection was performed exposing the anterior border  of the sternocleidomastoid muscle.  Using blunt dissection, the carotid  sheath was kept lateral and trachea and esophagus medial.  A large ventral  osteophyte which corresponded to the C4-C5 level was identified and a spinal  needle was placed at this level.  This was difficult to visualize because of  the patient's large body habitus, but with repeat x-ray, the C4-C5 level was  clearly identified.  The longus colli muscles were taken down from the  anterior cervical spine from the C4 to C6 levels using electrocautery and  Key elevator.  A self-retaining Shadowline retractor was  placed to faillite  exposure.  The large ventral osteophyte was removed, both at the C4-C5 and  C5-C6 level and the degenerated disc spaces were then incised with a 15  blade and disc material was removed in a piecemeal fashion.  Using a variety  of curets, the endplates were stripped of residual disc material.  Disc  space spreader was placed at the C4-C5 level and the operating microscope  was brought onto the field.  Using high powered microscopic visualization,  the endplates of C4 and C5 were decorticated and the high speed drill was  used to remove large uncinate spurs.  There was a large, calcified herniated  disc which was directly compressing the central spinal cord dura and this  was very carefully decompressed using a variety of 2 and 3 mm Kerrison  rongeurs.  The neural foramina were also decompressed, although care was  taken not to aggressively instrument the neural foramen.  Hemostasis was  assured with  Gelfoam soaked in thrombin.  After trial sizing, an 8 machined  Alpha graft bone graft was filled with drillings from the endplates of the  C4 and C5 levels and tapped into position and counter sunk appropriately.  Attention was turned to the C5-C6 level where a similar decompression was  performed and both neural foramina were decompressed as was the central  spinal cord dura.  Hemostasis was again assured at this level and a  similarly sized bone graft was packed with morselized autograft and tapped  into position.  A 34 mm Alphatek anterior cervical plate was then affixed to  the anterior cervical spine using 14 mm variable angle screws, two at C4,  two at C5, and two at C6.  All screws had excellent purchase.  Locking  mechanisms were engaged.  Final x-ray was obtained which only demonstrated  the construct at the superior aspect of C4 but this appeared to be well  positioned.  Hemostasis was assured with bipolar electrocautery and the soft  tissues were inspected and found to be in good repair.  The wound was  copiously irrigated with Bacitracin saline.  The platysmal layer was then  closed with 3-0 Vicryl sutures and the skin edges were reapproximated with a  running 4-0 Vicryl subcuticular stitch.  The wound was dressed with  Dermabond.  The patient was extubated in the operating room and taken to the  recovery room in stable satisfactory condition having tolerated the  operation well.  Counts were correct at the end of the case.                                               Marchia Meiers. Vertell Limber, M.D.    JDS/MEDQ  D:  11/20/2003  T:  11/21/2003  Job:  [id number]

## 2011-01-28 NOTE — Consult Note (Signed)
[facility]  Patient:    Jackie Parker, Jackie Parker                     MRN: [id number] Proc. Date: 12/19/00 Adm. Date:  [id number] Disc. Date: [id number] Attending:  Alvino Chapel CC:         Agnes Lawrence, M.D.  Caswell Corwin, R.N.   Consultation Report  A 70 year old African-American female referred by Dr. Delsa Sale for evaluation of abnormal bleeding.  She has been on Megace for treatment of simple hyperplasia confined to an endometrial polyp.  Repeat biopsy in February 2002 showed an inactive endometrium with pseudo decidualization consistent with progestational effect.  Unfortunately, the patient continues to bleed.  She had an endometrial stripe which was heterogeneous and markedly thickened measuring 2.3 cm.  Both ovaries were felt to be normal.  D&C has been recommended, although the patient desires a hysterectomy.  PAST MEDICAL HISTORY:  Significant for several comorbid conditions including obesity, chronic hypertension, and ulcerative colitis.  The patient has had a total colectomy and has an ileostomy.  She has also had surgery on her lumbar spine.  SOCIAL HISTORY:  The patient does not smoke or drink.  FAMILY HISTORY:  Remarkable for a brain tumor.  There is no gynecologic, breast, or colon cancers in the family.  ALLERGIES:  SULFA.  CURRENT MEDICATIONS:  Hydrochlorothiazide.  PHYSICAL EXAMINATION  VITAL SIGNS:  Weight 225 pounds, height 5 feet 2 inches.  GENERAL:  The patient is a pleasant woman in no acute distress.  HEENT:  Negative.  NECK:  Supple without thyromegaly.  ABDOMEN:  Obese, soft, nontender.  No masses, organomegaly, ascites, or hernias are noted.  Her ileostomy stoma is healthy.  PELVIC:  EG, BUS:  Normal.  Vagina is clean.  Cervix is atrophic and clean. Blood does come from the cervical os.  Bimanual examination reveals an anterior uterus which is slightly enlarged.  There are no adnexal  masses. Rectovaginal examination confirms.  Stool is guaiac negative.  IMPRESSION:  Abnormal uterine bleeding in a menopausal patient.  She did have a past history of hyperplasia but this has resolved with the use of Megace. Unless she continues to bleed her endometrial stripe is thickened.  PLAN:  I would recommend first of all the patient have a D&C to further evaluate this thickened endometrial stripe in that she may have an endometrial polyp.  The patient is very strongly against this approach.  Therefore, I would suggest she be treated with Depo-Provera 300 mg IM today and observe her bleeding.  She will keep a menstrual calendar.  If she does continue to bleed then [facility] will definitely be recommended.  I would be very hesitant to perform a hysterectomy on this patient unless bleeding was totally uncontrolled.  In that setting I would strongly consider using an approach of endometrial ablation to try to control her bleeding rather than put her through a major operation, especially given the fact she has had prior extensive abdominal surgery.  She is given an injection Depo-Provera 300 mg IM today and she will return to the care of Dr. Delsa Sale but I would be happy to see the patient in the future if her problems persist. DD:  12/23/00 TD:  12/23/00 Job: [id number]

## 2011-01-28 NOTE — H&P (Signed)
[facility]  Patient:    Jackie Parker, Jackie Parker                     MRN: [id number] Adm. Date:  07/11/00 Attending:  Agnes Lawrence, M.D. CC:         [facility]  Dr. Harlene Ramus at [facility]   History and Physical  CHIEF COMPLAINT:              The patient is a 70 year old with postmenopausal bleeding who presents for [facility] hysteroscopy to rule out an endometrial polyp.  HISTORY OF PRESENT ILLNESS:   As above.  Workup to date has included a Pap smear taken from September of 2001, that did not demonstrate any atypia.  A pelvic ultrasound from June 07, 2000, demonstrated a focal thickening in the uterine fundus.  The thickening was 1.5 cm.  Multiple myomas were seen as well and a possible finding consistent with adenomyosis.  These changes are persistent from the previous ultrasound from 2000.  ALLERGIES:                    SULFA.  MEDICATIONS:                  Hydrochlorothiazide.  PAST OB/GYN HISTORY:          She is status post five spontaneous vaginal deliveries.  PAST MEDICAL HISTORY:         Chronic hypertension, ulcerative colitis.  PAST SURGICAL HISTORY:        She is status post an ileostomy and back surgery.  SOCIAL HISTORY:               She denies any tobacco, ethanol, or drug use.  FAMILY HISTORY:               Remarkable for brain tumor.  PHYSICAL EXAMINATION:  VITAL SIGNS:                  Pulse is 79, blood pressure 120/67, and weight 207.4 pounds.  GENERAL APPEARANCE:           Obese in no apparent distress.  HEENT:                        Normocephalic and atraumatic.  NECK:                         Supple.  LUNGS:                        Clear to auscultation bilaterally.  HEART:                        Regular rate and rhythm.  ABDOMEN:                      Obese and nontender.  PELVIC EXAMINATION:           The labia majora are thin and reddened appearing.  On speculum exam,  the vagina is clean.  The bimanual exam is limited secondary to the patients body habitus, but was felt to be axial in position, normal size, and nontender.  The adnexa were nonpalpable and nontender.  SKIN:  Without ________.  ASSESSMENT:                   Postmenopausal bleeding, rule out endometrial                               polyp versus a neoplastic process.  PLAN:                         D&C hysteroscopy. DD:  07/11/00 TD:  07/11/00 Job: [id number]

## 2011-01-28 NOTE — Op Note (Signed)
[facility]  Patient:    Jackie Parker, Jackie Parker Visit Number: [id number] MRN: [id number]          Service Type: DSU Location: [facility] Attending Physician:  Lahoma Crocker Proc. Date: 04/04/02 Admit Date:  04/04/2002 Discharge Date: 04/04/2002   CC:         [facility] [place].   Operative Report  PREOPERATIVE DIAGNOSIS:       Endometrial polyp, post menopausal bleeding.  POSTOPERATIVE DIAGNOSIS:      Endometrial polyp, post menopausal bleeding.  OPERATION:                    Diagnostic hysteroscopy, polypectomy,                               dilatation and curettage, endometrial                               cryoablation.  SURGEON:                      Agnes Lawrence, M.D.  ASSISTANT:  ANESTHESIA:                   General endotracheal anesthesia.  COMPLICATIONS:                None.  ESTIMATED BLOOD LOSS:         50 cc.  FINDINGS:                     A 10-week size anteverted uterus.  On hysteroscopic examination of the intrauterine cavity, there was poor visualization secondary to increased heme.  However, a polypoid structure was seen coming off the bright cornu sidewall.  DESCRIPTION OF PROCEDURE:     The patient was taken to the operating room.  A sterile speculum was placed in the patients vagina.  The single-tooth tenaculum was applied to the anterior lip of the cervix.  The cervix is then dilated with [facility] dilators.  The hysteroscope was then introduced using Glycine as the distended medium.  An ADCC deficit was recorded.  The above findings were noted. The uterus sounded to 9 cm.  The polyp forceps were then introduced into the uterine fundus and two approximately 1 to 2 cm fleshy polypoid structures were then removed.  At frozen section, these polyps were felt to be benign.  A sharp curettage was performed and a gritty texture was noted, scant endometrial curettings were retrieved.  At  this point, the cryoablation was then performed. The cryo probe was introduced into the right cornu with ultrasound guidance.  A freeze and thaw technique was then used. This was repeated on the left cornu.  The cryo probe was removed without difficulty.  The single-tooth tenaculum was then removed with good hemostasis noted.  The patient tolerated the procedure well.  The patient was taken to the PACU in stable condition.  PATHOLOGY:                    Endometrial curettings, endometrial polyps. Attending Physician:  Lahoma Crocker DD:  04/04/02 TD:  04/09/02 Job: [id number]

## 2011-06-15 LAB — TROPONIN I: Troponin I: 0.03

## 2011-06-15 LAB — URINE MICROSCOPIC-ADD ON

## 2011-06-15 LAB — URINALYSIS, ROUTINE W REFLEX MICROSCOPIC
Bilirubin Urine: NEGATIVE
Glucose, UA: NEGATIVE
Hgb urine dipstick: NEGATIVE
Ketones, ur: NEGATIVE
Leukocytes, UA: NEGATIVE
Nitrite: POSITIVE — AB
Protein, ur: NEGATIVE
Specific Gravity, Urine: 1.008
Urobilinogen, UA: 0.2
pH: 6.5

## 2011-06-15 LAB — DIFFERENTIAL
Basophils Absolute: 0
Basophils Relative: 1
Eosinophils Absolute: 0.1
Eosinophils Relative: 1
Lymphocytes Relative: 48 — ABNORMAL HIGH
Lymphs Abs: 4.3 — ABNORMAL HIGH
Monocytes Absolute: 0.9
Monocytes Relative: 10
Neutro Abs: 3.6
Neutrophils Relative %: 41 — ABNORMAL LOW

## 2011-06-15 LAB — CK TOTAL AND CKMB (NOT AT ARMC)
CK, MB: 3
Relative Index: 0.4
Total CK: 770 — ABNORMAL HIGH

## 2011-06-15 LAB — POCT CARDIAC MARKERS
CKMB, poc: 2.6
Myoglobin, poc: 334
Troponin i, poc: <0.05

## 2011-06-15 LAB — COMPREHENSIVE METABOLIC PANEL
ALT: 25
ALT: 30
AST: 56 — ABNORMAL HIGH
AST: 64 — ABNORMAL HIGH
Albumin: 2.7 — ABNORMAL LOW
Albumin: 3 — ABNORMAL LOW
Alkaline Phosphatase: 76
Alkaline Phosphatase: 86
BUN: 10
BUN: 9
CO2: 23
CO2: 27
Calcium: 8.8
Calcium: 8.9
Chloride: 106
Chloride: 107
Creatinine, Ser: 1.11
Creatinine, Ser: 1.18
GFR calc Af Amer: 55 — ABNORMAL LOW
GFR calc Af Amer: 60 — ABNORMAL LOW
GFR calc non Af Amer: 46 — ABNORMAL LOW
GFR calc non Af Amer: 49 — ABNORMAL LOW
Glucose, Bld: 81
Glucose, Bld: 81
Potassium: 3 — ABNORMAL LOW
Potassium: 3.4 — ABNORMAL LOW
Sodium: 140
Sodium: 141
Total Bilirubin: 0.8
Total Bilirubin: 1
Total Protein: 7.1
Total Protein: 7.8

## 2011-06-15 LAB — LIPID PANEL
Cholesterol: 164
HDL: 49
LDL Cholesterol: 97
Total CHOL/HDL Ratio: 3.3
Triglycerides: 92
VLDL: 18

## 2011-06-15 LAB — CARDIAC PANEL(CRET KIN+CKTOT+MB+TROPI)
CK, MB: 2.9
CK, MB: 3.4
Relative Index: 0.3
Relative Index: 0.3
Total CK: 1024 — ABNORMAL HIGH
Total CK: 856 — ABNORMAL HIGH
Troponin I: 0.02
Troponin I: 0.04

## 2011-06-15 LAB — POCT I-STAT, CHEM 8
BUN: 11
Calcium, Ion: 0.99 — ABNORMAL LOW
Chloride: 108
Creatinine, Ser: 1.1
Glucose, Bld: 77
HCT: 46
Hemoglobin: 15.6 — ABNORMAL HIGH
Potassium: 5.3 — ABNORMAL HIGH
Sodium: 138
TCO2: 27

## 2011-06-15 LAB — CBC
HCT: 40.5
HCT: 44.9
Hemoglobin: 13.6
Hemoglobin: 14.8
MCHC: 33
MCHC: 33.6
MCV: 92.6
MCV: 92.8
Platelets: 204
Platelets: 209
RBC: 4.38
RBC: 4.84
RDW: 14
RDW: 14.3
WBC: 8.9
WBC: 9.1

## 2011-06-15 LAB — URINE CULTURE: Colony Count: >=100000

## 2011-06-15 LAB — POTASSIUM: Potassium: 3.5

## 2011-06-15 LAB — MAGNESIUM: Magnesium: 1.7

## 2011-06-15 LAB — LIPASE, BLOOD: Lipase: 23

## 2011-06-15 LAB — PROTIME-INR
INR: 1
Prothrombin Time: 13.5

## 2011-06-15 LAB — APTT: aPTT: 32

## 2011-06-15 LAB — TSH: TSH: 2.115

## 2011-12-06 ENCOUNTER — Encounter (HOSPITAL_COMMUNITY): Payer: Self-pay | Admitting: Emergency Medicine

## 2011-12-06 ENCOUNTER — Emergency Department (HOSPITAL_COMMUNITY)
Admission: EM | Admit: 2011-12-06 | Discharge: 2011-12-06 | Disposition: A | Discharge status: Home / Self Care | Payer: Medicare Other | Attending: Emergency Medicine | Admitting: Emergency Medicine

## 2011-12-06 DIAGNOSIS — I10 Essential (primary) hypertension: Secondary | ICD-10-CM | POA: Insufficient documentation

## 2011-12-06 DIAGNOSIS — K5190 Ulcerative colitis, unspecified, without complications: Secondary | ICD-10-CM | POA: Insufficient documentation

## 2011-12-06 DIAGNOSIS — L309 Dermatitis, unspecified: Secondary | ICD-10-CM

## 2011-12-06 DIAGNOSIS — IMO0002 Reserved for concepts with insufficient information to code with codable children: Secondary | ICD-10-CM | POA: Insufficient documentation

## 2011-12-06 DIAGNOSIS — Z882 Allergy status to sulfonamides status: Secondary | ICD-10-CM | POA: Insufficient documentation

## 2011-12-06 DIAGNOSIS — Y833 Surgical operation with formation of external stoma as the cause of abnormal reaction of the patient, or of later complication, without mention of misadventure at the time of the procedure: Secondary | ICD-10-CM | POA: Insufficient documentation

## 2011-12-06 DIAGNOSIS — Z79899 Other long term (current) drug therapy: Secondary | ICD-10-CM | POA: Insufficient documentation

## 2011-12-06 DIAGNOSIS — J45909 Unspecified asthma, uncomplicated: Secondary | ICD-10-CM | POA: Insufficient documentation

## 2011-12-06 HISTORY — DX: Ulcerative colitis, unspecified, without complications: K51.90

## 2011-12-06 HISTORY — DX: Essential (primary) hypertension: I10

## 2011-12-06 MED ORDER — HYDROCODONE-ACETAMINOPHEN 5-325 MG PO TABS
1.0000 | ORAL_TABLET | Freq: Once | ORAL | Status: AC
  Administered 2011-12-06: 1 via ORAL
  Filled 2011-12-06: qty 1

## 2011-12-06 NOTE — ED Provider Notes (Signed)
History     CSN: [id number]  Arrival date & time 12/06/11  1959   First MD Initiated Contact with Patient 12/06/11 2038      Chief Complaint  Patient presents with  . Rash    HPI  History provided by the patient. Patient is a 71 year old female with history of ulcerative colitis status post ostomy who presents with complaints of irritation of skin around the ostomy site and leakage. Patient reports symptoms gradually increasing for past 2-3 days. Patient reports having increased loose stools that cause skin irritation around his. She has poor adhesion of ostomy bag. Patient to call PCP who recommended her to begin taking Imodium for the loose stools symptoms. This is not given any improvement. Patient complains of continued problem of leakage around the ostomy bag. Symptoms are moderate. She reports no other aggravating or alleviating factors. She denies any associated fever, chills, sweats, nausea vomiting.    Past Medical History  Diagnosis Date  . Asthma   . Hypertension   . Bronchitis   . Colitis, ulcerative     Past Surgical History  Procedure Date  . Colostomy     No family history on file.  History  Substance Use Topics  . Smoking status: Never Smoker   . Smokeless tobacco: Not on file  . Alcohol Use: No    OB History    Grav Para Term Preterm Abortions TAB SAB Ect Mult Living                  Review of Systems  Constitutional: Negative for fever and chills.  Gastrointestinal: Negative for nausea, vomiting and abdominal pain.  Skin: Positive for rash.    Allergies  Sulfonamide derivatives  Home Medications   Current Outpatient Rx  Name Route Sig Dispense Refill  . BRIMONIDINE TARTRATE 0.15 % OP SOLN Both Eyes Place 1 drop into both eyes 3 (three) times daily.    Marland Kitchen CETIRIZINE HCL 10 MG PO TABS Oral Take 10 mg by mouth daily.    Marland Kitchen FLUTICASONE-SALMETEROL 250-50 MCG/DOSE IN AEPB Inhalation Inhale 1 puff into the lungs every 12 (twelve) hours.    Marland Kitchen  MONTELUKAST SODIUM 10 MG PO TABS Oral Take 10 mg by mouth at bedtime.    . TRAVOPROST (BAK FREE) 0.004 % OP SOLN Both Eyes Place 1 drop into both eyes at bedtime.    Marland Kitchen VALSARTAN-HYDROCHLOROTHIAZIDE 160-25 MG PO TABS Oral Take 1 tablet by mouth daily.      BP 130/69  Pulse 80  Temp(Src) 99.2 F (37.3 C) (Oral)  Resp 18  Physical Exam  Nursing note and vitals reviewed. Constitutional: She is oriented to person, place, and time. She appears well-developed and well-nourished. No distress.  HENT:  Head: Normocephalic.  Cardiovascular: Normal rate and regular rhythm.   Pulmonary/Chest: Effort normal and breath sounds normal. No respiratory distress. She has no wheezes.  Abdominal: Soft. She exhibits no distension. There is no rebound and no guarding.       Ostomy in right lower abdomen. Drainage around the ostomy with raw irritated skin. Area is tender to palpation. There is no induration or spreading erythema.  Neurological: She is alert and oriented to person, place, and time.  Skin: Skin is warm and dry. No rash noted.  Psychiatric: She has a normal mood and affect. Her behavior is normal.    ED Course  Procedures     1. Stoma dermatitis       MDM  9:50 PM patient  seen and evaluated. Patient in no acute distress.    Ostomy nurse has seen patient and clean ostomy and applied ointment and new ostomy bag. She was given instructions with few supplies she is at home. She will followup with primary care provider.    Martie Lee, [place] 12/08/11 [phone]

## 2011-12-06 NOTE — ED Notes (Signed)
PT. REPORTS PROGRESSING SKIN IRRITATION (REDDNESS)  AROUND COLOSTOMY ONSET 4 DAYS AGO .

## 2011-12-06 NOTE — Discharge Instructions (Signed)
You were seen and evaluated stay for complications and irritation of the skin around your ostomy site. At this time your ostomy was cleaned and you were provider with a cream to use to help with healing of the skin. Please continue to follow the instructions you were given for care of your ostomy. Call your primary care provider this week to schedule close followup appointment. If you develop any increasing pain around her ostomy, redness or persistent soreness and discharge return to emergency room or follow with your primary care provider.     Ostomy Support Information An ostomy is an opening in the belly (abdominal wall) made by surgery. Ostomates are people who have had this procedure. The opening (stoma) allows the kidney or bowel to discharge waste. An external pouch covers the stoma to collect waste. Pouches are are a simple bag and are odor free. Different companies have disposable or reusable pouches to fit one's lifestyle. An ostomy can either be temporary or permanent.  THERE ARE THREE MAIN TYPES OF OSTOMIES  Colostomy. A colostomy is a surgically created opening in the large intestine (colon).   Ileostomy. An ileostomy is a surgically created opening in the small intestine.   Urostomy. A urostomy is a surgically created opening to divert urine away from the bladder.  FREQUENTLY ASKED QUESTIONS Will I need to be on a special diet? Most people return to their normal diet when they have recovered from surgery. Be sure to chew your food well, eat a well-balanced diet and drink plenty of fluids. If you experience problems with a certain food, wait a couple of weeks and try it again. Will there be odor and noises? Pouching systems are designed to be odor-proof or odor-resistant. There are deodorants that can be used in the pouch. Medications are also available to help reduce odor. Limit gas-producing foods and carbonated beverages. You will experience less gas and fewer noises as you heal  from surgery. How much time will it take to care for my ostomy? At first, you may spend a lot of time learning about your ostomy and how to take care of it. As you become more comfortable and skilled at changing the pouching system, it will take very little time to care for it.  Will I be able to return to work? People with ostomies can perform most jobs. As soon as you have healed from surgery, you should be able to return to work. Heavy lifting (more than 10 pounds) may be discouraged.  What about intimacy? Sexual relationships and intimacy are important and fulfilling aspects of your life. They should continue after ostomy surgery. Intimacy-related concerns should be discussed openly between you and your partner.  Can I wear regular clothing? You do not need to wear special clothing. Ostomy pouches are fairly flat and barely noticeable. Elastic undergarments will not hurt the stoma or prevent the ostomy from functioning.  Can I participate in sports? An ostomy should not limit your involvement in sports. Many people with ostomies are runners, skiers, swimmers or participate in other active lifestyles. Talk with your caregiver first before doing heavy physical activity. PROFESSIONAL HELP  Resources are available if you need help or have questions about your ostomy.   Specially trained nurses called Wound, Ostomy Continence Nurses (WOCN) are available for consultation in most major medical centers.   The [employer] (UOA) is a group made up of many local chapters throughout the [place]. These local groups hold meetings and provide support to  prospective and existing ostomates. They sponsor educational events and have qualified visitors to make personal or telephone visits. Contact the UOA for the chapter nearest you and for other educational publications.   More detailed information can be found in Colostomy Guide, a publication of the [employer] (UOA). Contact  UOA at 1-325-496-5664 or visit their web site at [email or web address].com/. The website contains links to other sites, suppliers and resources.  Document Released: 09/01/2003 Document Revised: 08/18/2011 Document Reviewed: 12/31/2008 [facility] Patient Information 2012 [place].   RESOURCE GUIDE  Dental Problems  Patients with Medicaid: [place] [facility] Phone:  [phone]                                                  Phone:  [phone]  If unable to pay or uninsured, contact:  [facility] or [facility]. to become qualified for the adult dental clinic.  Chronic Pain Problems Contact [facility]  [phone] Patients need to be referred by their primary care doctor.  Insufficient Money for Medicine Contact United Way:  call "211" or [place] [phone].  No Primary Care Doctor Call [facility]  [phone] Other agencies that provide inexpensive medical care    [place]  [phone]    [facility]  [place]  [phone]    [facility]  [phone]    [facility]  [place]  [place]  [phone] [place]   [phone] (emergency services [phone])  Substance Abuse Resources [facility] and Drug Services  430-576-8453 [facility] (260)213-2634 [facility] [place] [phone] [facility] [phone] Residential & Outpatient Substance Abuse Program  [phone]  Abuse/Neglect [place] [phone] [place] [phone] (After Hours)  Emergency [facility] [phone]  [place] at the [place] [phone] [place] [phone]  MRSA Hotline #:   [phone]    [facility] [place] Dept. [address]                       [address]      [address] [place]  [facility] Phone:  [phone]                                   Phone:  [phone]                 Phone:  [place] Phone:  [place] [phone] (After Hours)

## 2011-12-06 NOTE — ED Notes (Signed)
Patient c/o rash and redness around ostomy site. Patient states post antibiotics and gout medication redness and skin breakdown began around ostomy.  Patient states now site bleeding and she is unable to keep pouch in place.  Patient c/o of burning and pain at ostomy site.

## 2011-12-09 NOTE — ED Provider Notes (Signed)
Medical screening examination/treatment/procedure(s) were performed by non-physician practitioner and as supervising physician I was immediately available for consultation/collaboration.   Julianne Rice, MD 12/09/11 [phone]

## 2011-12-30 ENCOUNTER — Other Ambulatory Visit: Payer: Self-pay | Admitting: Obstetrics & Gynecology

## 2011-12-30 DIAGNOSIS — N719 Inflammatory disease of uterus, unspecified: Secondary | ICD-10-CM

## 2011-12-30 DIAGNOSIS — Z975 Presence of (intrauterine) contraceptive device: Secondary | ICD-10-CM

## 2012-01-04 ENCOUNTER — Ambulatory Visit (HOSPITAL_COMMUNITY)
Admission: RE | Admit: 2012-01-04 | Discharge: 2012-01-04 | Disposition: A | Discharge status: Home / Self Care | Payer: Medicare Other | Source: Ambulatory Visit | Attending: Obstetrics & Gynecology | Admitting: Obstetrics & Gynecology

## 2012-01-04 DIAGNOSIS — Z975 Presence of (intrauterine) contraceptive device: Secondary | ICD-10-CM | POA: Insufficient documentation

## 2012-01-04 DIAGNOSIS — N938 Other specified abnormal uterine and vaginal bleeding: Secondary | ICD-10-CM | POA: Insufficient documentation

## 2012-01-04 DIAGNOSIS — N949 Unspecified condition associated with female genital organs and menstrual cycle: Secondary | ICD-10-CM | POA: Insufficient documentation

## 2012-01-04 DIAGNOSIS — N719 Inflammatory disease of uterus, unspecified: Secondary | ICD-10-CM | POA: Insufficient documentation

## 2013-03-18 ENCOUNTER — Encounter: Payer: Self-pay | Admitting: Obstetrics & Gynecology

## 2013-03-18 ENCOUNTER — Ambulatory Visit (INDEPENDENT_AMBULATORY_CARE_PROVIDER_SITE_OTHER): Payer: Medicare Other | Admitting: Obstetrics & Gynecology

## 2013-03-18 VITALS — BP 105/67 | HR 99 | Temp 98.6°F | Ht 62.0 in | Wt 236.6 lb

## 2013-03-18 DIAGNOSIS — Z0000 Encounter for general adult medical examination without abnormal findings: Secondary | ICD-10-CM

## 2013-03-18 DIAGNOSIS — N950 Postmenopausal bleeding: Secondary | ICD-10-CM

## 2013-03-18 NOTE — Patient Instructions (Signed)
Postmenopausal Bleeding Menopause is commonly referred to as the "change in life." It is a time when the fertile years, the time of ovulating and having menstrual periods, has come to an end. It is also determined by not having menstrual periods for 12 months.  Postmenopausal bleeding is any bleeding a woman has after she has entered into menopause. Any type of postmenopausal bleeding, even if it appears to be a typical menstrual period, is concerning. This should be evaluated by your caregiver.  CAUSES   Hormone therapy.  Cancer of the cervix or cancer of the lining of the uterus (endometrial cancer).  Thinning of the uterine lining (uterine atrophy).  Thyroid diseases.  Certain medicines.  Infection of the uterus or cervix.  Inflammation or irritation of the uterine lining (endometritis).  Estrogen-secreting tumors.  Growths (polyps) on the cervix, uterine lining, or uterus.  Uterine tumors (fibroids).  Being very overweight (obese). DIAGNOSIS  Your caregiver will take a medical history and ask questions. A physical exam will also be performed. Further tests may include:   A transvaginal ultrasound. An ultrasound wand or probe is inserted into your vagina to view the pelvic organs.  A biopsy of the lining of the uterus (endometrium). A sample of the endometrium is removed and examined.  A hysteroscopy. Your caregiver may use an instrument with a light and a camera attached to it (hysteroscope). The hysteroscope is used to look inside the uterus for problems.  A dilation and curettage (D&C). Tissue is removed from the uterine lining to be examined for problems. TREATMENT  Treatment depends on the cause of the bleeding. Some treatments include:   Surgery.  Medicines.  Hormones.  A hysteroscopy or D&C to remove polyps or fibroids.  Changing or stopping a current medicine you are taking. Talk to your caregiver about your specific treatment. HOME CARE INSTRUCTIONS    Maintain a healthy weight.  Keep regular pelvic exams and Pap tests. SEEK MEDICAL CARE IF:   You have bleeding, even if it is light in comparison to your previous periods.  Your bleeding lasts more than 1 week.  You have abdominal pain.  You develop bleeding with sexual intercourse. SEEK IMMEDIATE MEDICAL CARE IF:   You have a fever, chills, headache, dizziness, muscle aches, and bleeding.  You have severe pain with bleeding.  You are passing blood clots.  You have bleeding and need more than 1 pad an hour.  You feel faint. MAKE SURE YOU:  Understand these instructions.  Will watch your condition.  Will get help right away if you are not doing well or get worse. Document Released: 12/07/2005 Document Revised: 11/21/2011 Document Reviewed: 05/05/2011 [facility] Patient Information 2014 [place], [place].

## 2013-03-18 NOTE — Progress Notes (Signed)
.   Subjective:     Jackie Parker is a 72 y.o. female here for a problem exam.  Current complaints: Patient has noticed light bleeding (brownish) for the last two days. She had her IUD placed 09/2007  .  Personal health questionnaire reviewed: no.   Gynecologic History No LMP recorded. Patient is postmenopausal. Contraception: IUD Last Pap: 2009. Results were: normal Last mammogram: 02/2007. Results were: normal  Obstetric History OB History   Grav Para Term Preterm Abortions TAB SAB Ect Mult Living                   The following portions of the patient's history were reviewed and updated as appropriate: allergies, current medications, past family history, past medical history, past social history, past surgical history and problem list.  Review of Systems Pertinent items are noted in HPI.    Objective:     SPEC: scant heme; cervix normal appearing.  The IUD was removed intact.  The cervix was prepped with betadine. A single-tooth tenaculum was applied to the anterior lip of the cervix.  There was difficulty encountered sounding the uterus--? 7 cm.  There was resistance during insertion of the Mirena IUD.  The tenaculum was removed with minimal bleeding noted from the cervix. An informal U/S showed the IUD in the LUS.    Assessment:   Postmenopausal bleed Mirena IUD removal/placement  Plan:   Formal U/S to check endometrial lining Return in a few months or prn

## 2013-03-19 LAB — PAP IG W/ RFLX HPV ASCU

## 2013-03-25 ENCOUNTER — Other Ambulatory Visit: Payer: Self-pay | Admitting: Obstetrics & Gynecology

## 2013-03-25 DIAGNOSIS — R102 Pelvic and perineal pain: Secondary | ICD-10-CM

## 2013-03-26 ENCOUNTER — Other Ambulatory Visit: Payer: Medicare Other

## 2013-03-28 ENCOUNTER — Ambulatory Visit (HOSPITAL_COMMUNITY)
Admission: RE | Admit: 2013-03-28 | Discharge: 2013-03-28 | Disposition: A | Discharge status: Home / Self Care | Payer: Medicare Other | Source: Ambulatory Visit | Attending: Obstetrics & Gynecology | Admitting: Obstetrics & Gynecology

## 2013-03-28 DIAGNOSIS — R102 Pelvic and perineal pain: Secondary | ICD-10-CM

## 2013-03-28 DIAGNOSIS — N854 Malposition of uterus: Secondary | ICD-10-CM | POA: Insufficient documentation

## 2013-03-28 DIAGNOSIS — N949 Unspecified condition associated with female genital organs and menstrual cycle: Secondary | ICD-10-CM | POA: Insufficient documentation

## 2013-04-01 ENCOUNTER — Encounter: Payer: Self-pay | Admitting: Obstetrics & Gynecology

## 2013-04-01 ENCOUNTER — Ambulatory Visit (INDEPENDENT_AMBULATORY_CARE_PROVIDER_SITE_OTHER): Payer: Medicare Other | Admitting: Obstetrics & Gynecology

## 2013-04-01 VITALS — BP 124/84 | HR 89 | Wt 236.0 lb

## 2013-04-01 DIAGNOSIS — N950 Postmenopausal bleeding: Secondary | ICD-10-CM

## 2013-04-01 NOTE — Progress Notes (Signed)
Subjective:     Jackie Parker is a 72 y.o. female here for a routine exam.  Current complaints: Pt in office to review test results.  Personal health questionnaire reviewed: yes.   Gynecologic History No LMP recorded. Patient is postmenopausal. Contraception: none Last Pap: 2014. Results were: normal Last mammogram: 2014. Results were: normal  Obstetric History OB History   Grav Para Term Preterm Abortions TAB SAB Ect Mult Living                   The following portions of the patient's history were reviewed and updated as appropriate: allergies, current medications, past family history, past medical history, past social history, past surgical history and problem list.  Review of Systems Pertinent items are noted in HPI.    Objective:     No exam today     Assessment:   H/O endometrial polyps; endometrial lining OK IUD malpositioned--pt asymptomatic  Plan:   Return in a few months

## 2013-04-02 ENCOUNTER — Encounter: Payer: Self-pay | Admitting: Obstetrics & Gynecology

## 2013-06-19 ENCOUNTER — Encounter: Payer: Self-pay | Admitting: Obstetrics & Gynecology

## 2013-06-19 ENCOUNTER — Ambulatory Visit (INDEPENDENT_AMBULATORY_CARE_PROVIDER_SITE_OTHER): Payer: Medicare Other | Admitting: Obstetrics & Gynecology

## 2013-06-19 VITALS — BP 109/73 | HR 82 | Temp 99.0°F

## 2013-06-19 DIAGNOSIS — N898 Other specified noninflammatory disorders of vagina: Secondary | ICD-10-CM

## 2013-06-19 DIAGNOSIS — R8290 Unspecified abnormal findings in urine: Secondary | ICD-10-CM

## 2013-06-19 DIAGNOSIS — R82998 Other abnormal findings in urine: Secondary | ICD-10-CM

## 2013-06-19 MED ORDER — CIPROFLOXACIN HCL 500 MG PO TABS: 250.0000 mg | ORAL_TABLET | Freq: Two times a day (BID) | ORAL | Status: AC

## 2013-06-19 NOTE — Patient Instructions (Signed)
Urinary Incontinence Your doctor wants you to have this information about urinary incontinence. This is the inability to keep urine in your body until you decide to release it. CAUSES  Prostate gland enlargement is a common cause of urinary incontinence. But there are many different causes for losing urinary control. They include:  Medicines.  Infections.  Prostate problems.  Surgery.  Neurological diseases.  Emotional factors. DIAGNOSIS  Evaluating the cause of incontinence is important in choosing the best treatment. This may require:  An ultrasound exam.  Kidney and bladder X-rays.  Cystoscopy. This is an exam of the bladder using a narrow scope. TREATMENT  For incontinent patients, normal daily hygiene and using changing pads or adult diapers regularly will prevent offensive odors and skin damage from the moisture. Changing your medicines may help control incontinence. Your caregiver may prescribe some medicines to help you regain control. Avoid caffeine. It can over-stimulate the bladder. Use the bathroom regularly. Try about every 2 to 3 hours even if you do not feel the need. Take time to empty your bladder completely. After urinating, wait a minute. Then try to urinate again. External devices used to catch urine or an indwelling urine catheter (Foley catheter) may be needed as well. Some prostate gland problems require surgery to correct. Call your caregiver for more information. Document Released: 10/06/2004 Document Revised: 11/21/2011 Document Reviewed: 10/01/2008 [facility] Patient Information 2014 [place], [place].

## 2013-06-19 NOTE — Progress Notes (Signed)
Subjective:     Jackie Parker is a 72 y.o. female here for a routine exam.  Current complaints: Patient had been in the office for bleeding- an IUD was placed. Patient reports she has not had any bleeding and reports no pain..  Personal health questionnaire reviewed: no.   Gynecologic History No LMP recorded. Patient is postmenopausal. Contraception: post menopausal status       Obstetric History OB History  No data available     The following portions of the patient's history were reviewed and updated as appropriate: allergies, current medications, past family history, past medical history, past social history, past surgical history and problem list.  Review of Systems GU: "leaking fluid"   Objective:     SPEC: scant, yellow vaginal discharge Bimanual: IUD strings palpated, bladder NT     Assessment:    ?urinary incontinence vs vaginitis  Plan:   Orders Placed This Encounter  Procedures  . WET PREP BY MOLECULAR PROBE  Treat empirically for a UTI Return prn or in 6 mths

## 2013-06-19 NOTE — Addendum Note (Signed)
Addended by: Jiles Garter on: 06/19/2013 01:40 PM   Modules accepted: Orders

## 2013-06-20 LAB — URINE CULTURE: Colony Count: >=100000

## 2013-06-20 LAB — URINALYSIS, ROUTINE W REFLEX MICROSCOPIC
Bilirubin Urine: NEGATIVE
Glucose, UA: NEGATIVE mg/dL
Leukocytes, UA: NEGATIVE
Nitrite: NEGATIVE
Protein, ur: NEGATIVE mg/dL
Specific Gravity, Urine: 1.019 (ref 1.005–1.030)
Urobilinogen, UA: 0.2 mg/dL (ref 0.0–1.0)
pH: 6 (ref 5.0–8.0)

## 2013-06-20 LAB — URINALYSIS, MICROSCOPIC ONLY
Casts: NONE SEEN
Crystals: NONE SEEN

## 2013-06-20 LAB — WET PREP BY MOLECULAR PROBE
Candida species: POSITIVE — AB
Gardnerella vaginalis: NEGATIVE
Trichomonas vaginosis: NEGATIVE

## 2013-07-24 ENCOUNTER — Ambulatory Visit: Payer: Medicare Other | Admitting: Obstetrics & Gynecology

## 2013-08-29 ENCOUNTER — Emergency Department (HOSPITAL_COMMUNITY): Payer: Medicare Other

## 2013-08-29 ENCOUNTER — Encounter (HOSPITAL_COMMUNITY): Payer: Self-pay | Admitting: Emergency Medicine

## 2013-08-29 DIAGNOSIS — E876 Hypokalemia: Secondary | ICD-10-CM | POA: Diagnosis present

## 2013-08-29 DIAGNOSIS — J45909 Unspecified asthma, uncomplicated: Secondary | ICD-10-CM | POA: Diagnosis present

## 2013-08-29 DIAGNOSIS — K5190 Ulcerative colitis, unspecified, without complications: Secondary | ICD-10-CM | POA: Diagnosis present

## 2013-08-29 DIAGNOSIS — K56609 Unspecified intestinal obstruction, unspecified as to partial versus complete obstruction: Principal | ICD-10-CM | POA: Diagnosis present

## 2013-08-29 DIAGNOSIS — Z6841 Body Mass Index (BMI) 40.0 and over, adult: Secondary | ICD-10-CM

## 2013-08-29 DIAGNOSIS — I10 Essential (primary) hypertension: Secondary | ICD-10-CM | POA: Diagnosis present

## 2013-08-29 DIAGNOSIS — N179 Acute kidney failure, unspecified: Secondary | ICD-10-CM | POA: Diagnosis present

## 2013-08-29 DIAGNOSIS — Z932 Ileostomy status: Secondary | ICD-10-CM

## 2013-08-29 LAB — CBC WITH DIFFERENTIAL/PLATELET
Basophils Absolute: 0 10*3/uL (ref 0.0–0.1)
Basophils Relative: 0 % (ref 0–1)
Eosinophils Absolute: 0 10*3/uL (ref 0.0–0.7)
Eosinophils Relative: 0 % (ref 0–5)
HCT: 42.5 % (ref 36.0–46.0)
Hemoglobin: 15.2 g/dL — ABNORMAL HIGH (ref 12.0–15.0)
Lymphocytes Relative: 25 % (ref 12–46)
Lymphs Abs: 3.1 10*3/uL (ref 0.7–4.0)
MCH: 33.3 pg (ref 26.0–34.0)
MCHC: 35.8 g/dL (ref 30.0–36.0)
MCV: 93.2 fL (ref 78.0–100.0)
Monocytes Absolute: 1.2 10*3/uL — ABNORMAL HIGH (ref 0.1–1.0)
Monocytes Relative: 10 % (ref 3–12)
Neutro Abs: 8 10*3/uL — ABNORMAL HIGH (ref 1.7–7.7)
Neutrophils Relative %: 65 % (ref 43–77)
Platelets: 219 10*3/uL (ref 150–400)
RBC: 4.56 MIL/uL (ref 3.87–5.11)
RDW: 13.2 % (ref 11.5–15.5)
WBC: 12.3 10*3/uL — ABNORMAL HIGH (ref 4.0–10.5)

## 2013-08-29 NOTE — ED Notes (Signed)
Pt. reports RLQ pain with nausea and vomitting , pt. also noticed no stool output from her ileostomy this evening .

## 2013-08-30 ENCOUNTER — Encounter (HOSPITAL_COMMUNITY): Payer: Self-pay | Admitting: General Practice

## 2013-08-30 ENCOUNTER — Inpatient Hospital Stay (HOSPITAL_COMMUNITY): Payer: Medicare Other

## 2013-08-30 ENCOUNTER — Inpatient Hospital Stay (HOSPITAL_COMMUNITY)
Admission: EM | Admit: 2013-08-30 | Discharge: 2013-09-03 | DRG: 389 | Disposition: A | Discharge status: Home / Self Care | Payer: Medicare Other | Attending: Internal Medicine | Admitting: Internal Medicine

## 2013-08-30 ENCOUNTER — Emergency Department (HOSPITAL_COMMUNITY): Payer: Medicare Other

## 2013-08-30 DIAGNOSIS — K56609 Unspecified intestinal obstruction, unspecified as to partial versus complete obstruction: Principal | ICD-10-CM | POA: Diagnosis present

## 2013-08-30 DIAGNOSIS — R112 Nausea with vomiting, unspecified: Secondary | ICD-10-CM | POA: Diagnosis present

## 2013-08-30 DIAGNOSIS — Z9889 Other specified postprocedural states: Secondary | ICD-10-CM

## 2013-08-30 DIAGNOSIS — R109 Unspecified abdominal pain: Secondary | ICD-10-CM | POA: Diagnosis present

## 2013-08-30 DIAGNOSIS — E876 Hypokalemia: Secondary | ICD-10-CM

## 2013-08-30 DIAGNOSIS — K56600 Partial intestinal obstruction, unspecified as to cause: Secondary | ICD-10-CM

## 2013-08-30 DIAGNOSIS — Z9049 Acquired absence of other specified parts of digestive tract: Secondary | ICD-10-CM

## 2013-08-30 DIAGNOSIS — I10 Essential (primary) hypertension: Secondary | ICD-10-CM

## 2013-08-30 DIAGNOSIS — E872 Acidosis: Secondary | ICD-10-CM

## 2013-08-30 DIAGNOSIS — R52 Pain, unspecified: Secondary | ICD-10-CM

## 2013-08-30 DIAGNOSIS — N179 Acute kidney failure, unspecified: Secondary | ICD-10-CM | POA: Diagnosis present

## 2013-08-30 DIAGNOSIS — K5190 Ulcerative colitis, unspecified, without complications: Secondary | ICD-10-CM | POA: Diagnosis present

## 2013-08-30 LAB — CBC
HCT: 38.6 % (ref 36.0–46.0)
Hemoglobin: 13.5 g/dL (ref 12.0–15.0)
MCH: 32.9 pg (ref 26.0–34.0)
MCHC: 35 g/dL (ref 30.0–36.0)
MCV: 94.1 fL (ref 78.0–100.0)
Platelets: 200 10*3/uL (ref 150–400)
RBC: 4.1 MIL/uL (ref 3.87–5.11)
RDW: 13.4 % (ref 11.5–15.5)
WBC: 11.8 10*3/uL — ABNORMAL HIGH (ref 4.0–10.5)

## 2013-08-30 LAB — BASIC METABOLIC PANEL WITH GFR
BUN: 19 mg/dL (ref 6–23)
BUN: 23 mg/dL (ref 6–23)
CO2: 21 meq/L (ref 19–32)
CO2: 21 meq/L (ref 19–32)
Calcium: 8.1 mg/dL — ABNORMAL LOW (ref 8.4–10.5)
Calcium: 8.2 mg/dL — ABNORMAL LOW (ref 8.4–10.5)
Chloride: 109 meq/L (ref 96–112)
Chloride: 111 meq/L (ref 96–112)
Creatinine, Ser: 1.57 mg/dL — ABNORMAL HIGH (ref 0.50–1.10)
Creatinine, Ser: 1.78 mg/dL — ABNORMAL HIGH (ref 0.50–1.10)
GFR calc Af Amer: 37 mL/min — ABNORMAL LOW
GFR calc Af Amer: 32 mL/min — ABNORMAL LOW
GFR calc non Af Amer: 32 mL/min — ABNORMAL LOW
GFR calc non Af Amer: 27 mL/min — ABNORMAL LOW
Glucose, Bld: 118 mg/dL — ABNORMAL HIGH (ref 70–99)
Glucose, Bld: 98 mg/dL (ref 70–99)
Potassium: 3.2 meq/L — ABNORMAL LOW (ref 3.5–5.1)
Potassium: 3.4 meq/L — ABNORMAL LOW (ref 3.5–5.1)
Sodium: 140 meq/L (ref 135–145)
Sodium: 142 meq/L (ref 135–145)

## 2013-08-30 LAB — COMPREHENSIVE METABOLIC PANEL
ALT: 32 U/L (ref 0–35)
AST: 47 U/L — ABNORMAL HIGH (ref 0–37)
Albumin: 3 g/dL — ABNORMAL LOW (ref 3.5–5.2)
Alkaline Phosphatase: 101 U/L (ref 39–117)
BUN: 19 mg/dL (ref 6–23)
CO2: 19 mEq/L (ref 19–32)
Calcium: 8.9 mg/dL (ref 8.4–10.5)
Chloride: 106 mEq/L (ref 96–112)
Creatinine, Ser: 1.64 mg/dL — ABNORMAL HIGH (ref 0.50–1.10)
GFR calc Af Amer: 35 mL/min — ABNORMAL LOW (ref >90)
GFR calc non Af Amer: 30 mL/min — ABNORMAL LOW (ref >90)
Glucose, Bld: 143 mg/dL — ABNORMAL HIGH (ref 70–99)
Potassium: 2.8 mEq/L — ABNORMAL LOW (ref 3.5–5.1)
Sodium: 138 mEq/L (ref 135–145)
Total Bilirubin: 1.1 mg/dL (ref 0.3–1.2)
Total Protein: 8.3 g/dL (ref 6.0–8.3)

## 2013-08-30 LAB — URINALYSIS, ROUTINE W REFLEX MICROSCOPIC
Glucose, UA: NEGATIVE mg/dL
Hgb urine dipstick: NEGATIVE
Ketones, ur: NEGATIVE mg/dL
Nitrite: NEGATIVE
Protein, ur: NEGATIVE mg/dL
Specific Gravity, Urine: 1.021 (ref 1.005–1.030)
Urobilinogen, UA: 1 mg/dL (ref 0.0–1.0)
pH: 5.5 (ref 5.0–8.0)

## 2013-08-30 LAB — URINE MICROSCOPIC-ADD ON

## 2013-08-30 LAB — LACTIC ACID, PLASMA: Lactic Acid, Venous: 1.3 mmol/L (ref 0.5–2.2)

## 2013-08-30 LAB — LIPASE, BLOOD: Lipase: 42 U/L (ref 11–59)

## 2013-08-30 LAB — MAGNESIUM: Magnesium: 1.4 mg/dL — ABNORMAL LOW (ref 1.5–2.5)

## 2013-08-30 MED ORDER — FENTANYL CITRATE 0.05 MG/ML IJ SOLN
100.0000 ug | Freq: Once | INTRAMUSCULAR | Status: AC
  Administered 2013-08-30: 100 ug via INTRAVENOUS
  Filled 2013-08-30: qty 2

## 2013-08-30 MED ORDER — ENOXAPARIN SODIUM 40 MG/0.4ML ~~LOC~~ SOLN
40.0000 mg | SUBCUTANEOUS | Status: DC
  Administered 2013-08-30 – 2013-09-02 (×4): 40 mg via SUBCUTANEOUS
  Filled 2013-08-30 (×5): qty 0.4

## 2013-08-30 MED ORDER — DEXTROSE-NACL 5-0.45 % IV SOLN: INTRAVENOUS | Status: DC

## 2013-08-30 MED ORDER — SODIUM CHLORIDE 0.9 % IJ SOLN
3.0000 mL | Freq: Two times a day (BID) | INTRAMUSCULAR | Status: DC
  Administered 2013-08-30 – 2013-09-01 (×2): 3 mL via INTRAVENOUS

## 2013-08-30 MED ORDER — POTASSIUM CHLORIDE IN NACL 40-0.9 MEQ/L-% IV SOLN
INTRAVENOUS | Status: DC
  Administered 2013-08-30: 05:00:00 via INTRAVENOUS
  Filled 2013-08-30 (×2): qty 1000

## 2013-08-30 MED ORDER — ONDANSETRON HCL 4 MG PO TABS: 4.0000 mg | ORAL_TABLET | Freq: Four times a day (QID) | ORAL | Status: DC | PRN

## 2013-08-30 MED ORDER — SODIUM CHLORIDE 0.9 % IV BOLUS (SEPSIS)
1000.0000 mL | Freq: Once | INTRAVENOUS | Status: AC
  Administered 2013-08-30: 1000 mL via INTRAVENOUS

## 2013-08-30 MED ORDER — HYDROMORPHONE HCL PF 1 MG/ML IJ SOLN
1.0000 mg | INTRAMUSCULAR | Status: DC | PRN
  Administered 2013-08-30 – 2013-09-01 (×8): 1 mg via INTRAVENOUS
  Filled 2013-08-30 (×8): qty 1

## 2013-08-30 MED ORDER — ONDANSETRON HCL 4 MG/2ML IJ SOLN: 4.0000 mg | Freq: Three times a day (TID) | INTRAMUSCULAR | Status: AC | PRN

## 2013-08-30 MED ORDER — ONDANSETRON HCL 4 MG/2ML IJ SOLN
INTRAMUSCULAR | Status: AC
  Filled 2013-08-30: qty 2

## 2013-08-30 MED ORDER — ONDANSETRON HCL 4 MG/2ML IJ SOLN
4.0000 mg | Freq: Once | INTRAMUSCULAR | Status: AC
  Administered 2013-08-30: 4 mg via INTRAVENOUS
  Filled 2013-08-30: qty 2

## 2013-08-30 MED ORDER — ONDANSETRON HCL 4 MG/2ML IJ SOLN
4.0000 mg | Freq: Four times a day (QID) | INTRAMUSCULAR | Status: DC | PRN
  Administered 2013-08-30: 4 mg via INTRAVENOUS

## 2013-08-30 MED ORDER — POTASSIUM CHLORIDE IN NACL 40-0.9 MEQ/L-% IV SOLN
INTRAVENOUS | Status: DC
  Administered 2013-08-30 (×2): 125 mL/h via INTRAVENOUS
  Administered 2013-08-31: 21:00:00 via INTRAVENOUS
  Administered 2013-08-31 – 2013-09-01 (×2): 125 mL/h via INTRAVENOUS
  Administered 2013-09-01: 03:00:00 via INTRAVENOUS
  Filled 2013-08-30 (×10): qty 1000

## 2013-08-30 MED ORDER — POTASSIUM CHLORIDE 10 MEQ/100ML IV SOLN
10.0000 meq | Freq: Once | INTRAVENOUS | Status: AC
  Administered 2013-08-30: 10 meq via INTRAVENOUS
  Filled 2013-08-30: qty 100

## 2013-08-30 MED ORDER — MAGNESIUM SULFATE 40 MG/ML IJ SOLN
2.0000 g | Freq: Once | INTRAMUSCULAR | Status: AC
  Administered 2013-08-30: 2 g via INTRAVENOUS
  Filled 2013-08-30: qty 50

## 2013-08-30 MED ORDER — SODIUM CHLORIDE 0.9 % IV SOLN: INTRAVENOUS | Status: DC

## 2013-08-30 NOTE — ED Notes (Signed)
Pt has ostomy RLQ that is known to only a few of her family members.  Pt does not want any member of the health care team to speak in front of the family regarding this.

## 2013-08-30 NOTE — ED Notes (Signed)
Family at bedside. 

## 2013-08-30 NOTE — H&P (Signed)
PCP:   MULBERRY,ELIZABETH, MD   Chief Complaint:  N/v abd pain  HPI: 72 yo female h/o total colectomy in 1977 for ulcerative colitis with colostomy comes in with one day of generalized abd pain, no output from her ostomy or flatus which has now progressed to multiple episodes of n/v bilious in nature.  No fevers.  She has never had a problem with her ostomy before.  She has received ngt decompression in ED with iv pain meds and feels much better.  Has had no sick contacts.  Pt has already been evaluated by surgery.  PATIENT DOES NOT WANT ANY OF HER FAMILY TO KNOW THAT SHE EVER HAD A COLECTOMY AND HAS AN OSTOMY.  dtr is Network engineer in [employer].  Review of Systems:  Positive and negative as per HPI otherwise all other systems are negative  Past Medical History: Past Medical History  Diagnosis Date  . Asthma   . Hypertension   . Bronchitis   . Colitis, ulcerative    Past Surgical History  Procedure Laterality Date  . Colostomy    . Ileostomy      Medications: Prior to Admission medications   Medication Sig Start Date End Date Taking? Authorizing Provider  bimatoprost (LUMIGAN) 0.03 % ophthalmic solution Place 1 drop into both eyes 2 (two) times daily.   Yes Historical Provider, MD  cetirizine (ZYRTEC) 10 MG tablet Take 10 mg by mouth daily.   Yes Historical Provider, MD  Cholecalciferol (VITAMIN D PO) Take 1 tablet by mouth daily.   Yes Historical Provider, MD  Fluticasone-Salmeterol (ADVAIR) 250-50 MCG/DOSE AEPB Inhale 1 puff into the lungs every 12 (twelve) hours.   Yes Historical Provider, MD  montelukast (SINGULAIR) 10 MG tablet Take 10 mg by mouth at bedtime.   Yes Historical Provider, MD  valsartan-hydrochlorothiazide (DIOVAN-HCT) 160-25 MG per tablet Take 1 tablet by mouth daily.   Yes Historical Provider, MD    Allergies:   Allergies  Allergen Reactions  . Sulfonamide Derivatives     REACTION: rash    Social History:  reports that she has never smoked. She does not  have any smokeless tobacco history on file. She reports that she does not drink alcohol or use illicit drugs.  Family History: none  Physical Exam: Filed Vitals:   08/29/13 2317  BP: 166/102  Pulse: 131  Temp: 98.9 F (37.2 C)  TempSrc: Oral  Resp: 18  SpO2: 98%   General appearance: alert, cooperative and no distress Head: Normocephalic, without obvious abnormality, atraumatic Eyes: negative Nose: Nares normal. Septum midline. Mucosa normal. No drainage or sinus tenderness. Neck: no JVD and supple, symmetrical, trachea midline Lungs: clear to auscultation bilaterally Heart: regular rate and rhythm, S1, S2 normal, no murmur, click, rub or gallop Abdomen: soft, non-tender; bowel sounds normal; no masses,  no organomegaly  Ostomy site not inspected due to daughter being in room  No r/g  nonacute abd Extremities: extremities normal, atraumatic, no cyanosis or edema Pulses: 2+ and symmetric Skin: Skin color, texture, turgor normal. No rashes or lesions Neurologic: Grossly normal  Labs on Admission:   Recent Labs  08/29/13 2333  NA 138  K 2.8*  CL 106  CO2 19  GLUCOSE 143*  BUN 19  CREATININE 1.64*  CALCIUM 8.9    Recent Labs  08/29/13 2333  AST 47*  ALT 32  ALKPHOS 101  BILITOT 1.1  PROT 8.3  ALBUMIN 3.0*    Recent Labs  08/29/13 2333  WBC 12.3*  NEUTROABS  8.0*  HGB 15.2*  HCT 42.5  MCV 93.2  PLT 219   Radiological Exams on Admission: Dg Abd 1 View  08/30/2013   CLINICAL DATA:  Nausea and vomiting. Suspect problems with colostomy back.  EXAM: ABDOMEN - 1 VIEW  COMPARISON:  05/19/2008 abdominal CT.  FINDINGS: Borderline dilated small bowel in the left abdomen (3.5 cm in maximal diameter). No abnormal intra-abdominal mass effect. No urolithiasis suspected. IUD noted. 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  IMPRESSION: Borderline dilated small bowel is central abdomen which could represent early/low grade obstruction or mild ileus.    Electronically Signed   By: Jorje Guild M.D.   On: 08/30/2013 00:23    Assessment/Plan 72 yo female with psbo s/p total colectomy/ostomy  Principal Problem:   Small bowel obstruction, partial-  Conservative tx.  Ivf.  Npo.  gen surgery to also follow. Hopefully will improve with above tx.  Iv zofran and dilaudid prn.  Add on lactic acid level.  Active Problems:   COLITIS, ULCERATIVE NOS  stable   Acute renal failure  ivf and repeat cr in am   S/p total colectomy in 1977  noted   N&V (nausea and vomiting)  As above   Abdominal pain, acute  As above   Hypokalemia  Replete and ck mag level  Please be mindful of above note of pt wishes to keep confidential her ostomy status (this was relayed by ED staff), when pt alone would inquire more about this issue tomorrow.    Timmia Cogburn A 08/30/2013, 3:09 AM

## 2013-08-30 NOTE — ED Provider Notes (Signed)
CSN: [id number]     Arrival date & time 08/29/13  2305 History   First MD Initiated Contact with Patient 08/30/13 0130     Chief Complaint  Patient presents with  . Abdominal Pain    [facility] PCP  (Consider location/radiation/quality/duration/timing/severity/associated sxs/prior Treatment) HPI 72 year old female history of ulcer colitis with total colectomy and ileostomy but does not want her family to know that she has an ileostomy in 1977, presents with less than 24 hours of mild right-sided abdominal pain multiple episodes of nonbloody vomiting with no ileostomy output no flatus no stool in almost 24 hours no CP/SOB/dysuria/fever.   Past Medical History  Diagnosis Date  . Asthma   . Hypertension   . Bronchitis   . Colitis, ulcerative    Past Surgical History  Procedure Laterality Date  . Colostomy    . Ileostomy     History reviewed. No pertinent family history. History  Substance Use Topics  . Smoking status: Never Smoker   . Smokeless tobacco: Not on file  . Alcohol Use: No   OB History   Grav Para Term Preterm Abortions TAB SAB Ect Mult Living                 Review of Systems 10 Systems reviewed and are negative for acute change except as noted in the HPI. Allergies  Sulfonamide derivatives  Home Medications   No current outpatient prescriptions on file. BP 125/59  Pulse 73  Temp(Src) 97.7 F (36.5 C) (Oral)  Resp 18  Ht 5' 2"  (1.575 m)  Wt 273 lb 4.8 oz (123.968 kg)  BMI 49.97 kg/m2  SpO2 96% Physical Exam  Nursing note and vitals reviewed. Constitutional:  Awake, alert, nontoxic appearance.  HENT:  Head: Atraumatic.  Eyes: Right eye exhibits no discharge. Left eye exhibits no discharge.  Neck: Neck supple.  Cardiovascular: Normal rate and regular rhythm.   No murmur heard. Pulmonary/Chest: Effort normal and breath sounds normal. No respiratory distress. She has no wheezes. She has no rales. She exhibits no tenderness.  Abdominal: Soft. Bowel  sounds are normal. She exhibits no distension and no mass. There is tenderness. There is no rebound and no guarding.  Bowel sounds present mild right-sided abdominal tenderness  Musculoskeletal: She exhibits no tenderness.  Baseline ROM, no obvious new focal weakness.  Neurological: She is alert.  Mental status and motor strength appears baseline for patient and situation.  Skin: No rash noted.  Psychiatric: She has a normal mood and affect.    ED Course  Procedures (including critical care time) D/w [facility] for admit and d/w [facility] will consult.Patient / Family / Caregiver informed of clinical course, understand medical decision-making process, and agree with plan. Labs Review Labs Reviewed  CBC WITH DIFFERENTIAL - Abnormal; Notable for the following:    WBC 12.3 (*)    Hemoglobin 15.2 (*)    Neutro Abs 8.0 (*)    Monocytes Absolute 1.2 (*)    All other components within normal limits  COMPREHENSIVE METABOLIC PANEL - Abnormal; Notable for the following:    Potassium 2.8 (*)    Glucose, Bld 143 (*)    Creatinine, Ser 1.64 (*)    Albumin 3.0 (*)    AST 47 (*)    GFR calc non Af Amer 30 (*)    GFR calc Af Amer 35 (*)    All other components within normal limits  URINALYSIS, ROUTINE W REFLEX MICROSCOPIC - Abnormal; Notable for the following:    Color,  Urine AMBER (*)    Bilirubin Urine SMALL (*)    Leukocytes, UA TRACE (*)    All other components within normal limits  CBC - Abnormal; Notable for the following:    WBC 11.8 (*)    All other components within normal limits  BASIC METABOLIC PANEL - Abnormal; Notable for the following:    Potassium 3.2 (*)    Glucose, Bld 118 (*)    Creatinine, Ser 1.57 (*)    Calcium 8.1 (*)    GFR calc non Af Amer 32 (*)    GFR calc Af Amer 37 (*)    All other components within normal limits  MAGNESIUM - Abnormal; Notable for the following:    Magnesium 1.4 (*)    All other components within normal limits  URINE MICROSCOPIC-ADD ON - Abnormal;  Notable for the following:    Squamous Epithelial / LPF MANY (*)    Bacteria, UA FEW (*)    All other components within normal limits  BASIC METABOLIC PANEL - Abnormal; Notable for the following:    Potassium 3.4 (*)    Creatinine, Ser 1.78 (*)    Calcium 8.2 (*)    GFR calc non Af Amer 27 (*)    GFR calc Af Amer 32 (*)    All other components within normal limits  URINE CULTURE  LIPASE, BLOOD  LACTIC ACID, PLASMA  BASIC METABOLIC PANEL  MAGNESIUM  CBC   Imaging Review Dg Abd 1 View  08/30/2013   CLINICAL DATA:  Lower abdominal pain.  EXAM: ABDOMEN - 1 VIEW  COMPARISON:  Radiography from yesterday  FINDINGS: Mildly dilated bowel in the left abdomen is again seen, currently 4 cm. Fluid levels are newly seen in the right abdomen. The patient is status post colectomy per prior CT imaging. IUD again noted. No pneumoperitoneum seen.  IMPRESSION: 1. Negative for pneumoperitoneum. 2. Dilated small bowel with fluid levels, suggesting ileus or obstruction.   Electronically Signed   By: Jorje Guild M.D.   On: 08/30/2013 03:21   Dg Abd 1 View  08/30/2013   CLINICAL DATA:  Nausea and vomiting. Suspect problems with colostomy back.  EXAM: ABDOMEN - 1 VIEW  COMPARISON:  05/19/2008 abdominal CT.  FINDINGS: Borderline dilated small bowel in the left abdomen (3.5 cm in maximal diameter). No abnormal intra-abdominal mass effect. No urolithiasis suspected. IUD noted. 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  IMPRESSION: Borderline dilated small bowel is central abdomen which could represent early/low grade obstruction or mild ileus.   Electronically Signed   By: Jorje Guild M.D.   On: 08/30/2013 00:23   Dg Abd Portable 1v  08/30/2013   CLINICAL DATA:  NG tube placement  EXAM: PORTABLE ABDOMEN - 1 VIEW  COMPARISON:  08/30/2013  FINDINGS: NG tube enters stomach, loops in the fundus with the tip directed into the stomach body. Heart is enlarged. Mild distention of the small bowel in the mid  abdomen. Postop changes of the lower lumbar spine.  IMPRESSION: NG tube within the stomach.  Similar small bowel distension.   Electronically Signed   By: Daryll Brod M.D.   On: 08/30/2013 07:16    EKG Interpretation    Date/Time:  Friday August 30 2013 02:09:37 EST Ventricular Rate:  92 PR Interval:  149 QRS Duration: 98 QT Interval:  383 QTC Calculation: 474 R Axis:   -21 Text Interpretation:  Sinus rhythm Supraventricular bigeminy Low voltage, precordial leads Abnormal R-wave progression, early transition Left ventricular hypertrophy  No significant change since last tracing Confirmed by [facility]  MD, Francisco Ostrovsky ([id number]) on 08/30/2013 2:24:00 AM            MDM   1. SBO (small bowel obstruction)   2. Hypokalemia   3. Abdominal pain, acute   4. Acute renal failure   5. N&V (nausea and vomiting)   6. Partial small bowel obstruction   7. S/p total colectomy    The patient appears reasonably stabilized for admission considering the current resources, flow, and capabilities available in the ED at this time, and I doubt any other [facility] requiring further screening and/or treatment in the ED prior to admission.    Babette Relic, MD 08/30/13 2106

## 2013-08-30 NOTE — Progress Notes (Signed)
Patient seen and examined. Admitted after midnight secondary to abdominal pain, nausea and vomiting. Patient with partial SBO on x-ray and prior hx of colectomy/colostomy. No output through colostomy in 24 hours. Please referred to H&P by dr. Shanon Brow for further details/info on admission.  Plan: -analgesics and antiemetics PRN -IVF's -NPO -NGT -abd x-ray in am -surgery consulted, will follow recommendations  [name] [phone]

## 2013-08-30 NOTE — Progress Notes (Signed)
Utilization review completed. Westin Knotts, RN, BSN. 

## 2013-08-30 NOTE — Progress Notes (Signed)
Subjective: She is nauseated and NG suction is not hooked up. Something wrong with system.  Stuff is there to jury rig the system but not hooked up.  I restarted and got 150 ml out right away.  She has some fluid in the ileostomy, this is her first problem since her ileostomy in 1977.  Objective: Vital signs in last 24 hours: Temp:  [98.4 F (36.9 C)-98.9 F (37.2 C)] 98.4 F (36.9 C) (12/19 0413) Pulse Rate:  [94-131] 99 (12/19 0413) Resp:  [16-19] 18 (12/19 0413) BP: (114-166)/(57-102) 150/70 mmHg (12/19 0413) SpO2:  [96 %-99 %] 99 % (12/19 0413) Weight:  [123.968 kg (273 lb 4.8 oz)] 123.968 kg (273 lb 4.8 oz) (12/19 0413) Last BM Date: 08/23/13 (ostomy) 150 ml emesis recorded. Afebrile, BP up on admit better now Intake/Output from previous day: 12/18 0701 - 12/19 0700 In: -  Out: 150 [Emesis/NG output:150] Intake/Output this shift: Total I/O In: 0  Out: 300 [Urine:300]  General appearance: alert, cooperative and mild distress Resp: clear to auscultation bilaterally GI: distended, some fluid in ostomy bag, NG draining once it was fixed.  Few bowel sounds.  Lab Results:   Recent Labs  08/29/13 2333 08/30/13 0630  WBC 12.3* 11.8*  HGB 15.2* 13.5  HCT 42.5 38.6  PLT 219 200    BMET  Recent Labs  08/29/13 2333 08/30/13 0630  NA 138 140  K 2.8* 3.2*  CL 106 109  CO2 19 21  GLUCOSE 143* 118*  BUN 19 19  CREATININE 1.64* 1.57*  CALCIUM 8.9 8.1*   PT/INR No results found for this basename: LABPROT, INR,  in the last 72 hours   Recent Labs Lab 08/29/13 2333  AST 47*  ALT 32  ALKPHOS 101  BILITOT 1.1  PROT 8.3  ALBUMIN 3.0*     Lipase     Component Value Date/Time   LIPASE 42 08/29/2013 2312     Studies/Results: Dg Abd 1 View  08/30/2013   CLINICAL DATA:  Lower abdominal pain.  EXAM: ABDOMEN - 1 VIEW  COMPARISON:  Radiography from yesterday  FINDINGS: Mildly dilated bowel in the left abdomen is again seen, currently 4 cm. Fluid levels are  newly seen in the right abdomen. The patient is status post colectomy per prior CT imaging. IUD again noted. No pneumoperitoneum seen.  IMPRESSION: 1. Negative for pneumoperitoneum. 2. Dilated small bowel with fluid levels, suggesting ileus or obstruction.   Electronically Signed   By: Jorje Guild M.D.   On: 08/30/2013 03:21   Dg Abd 1 View  08/30/2013   CLINICAL DATA:  Nausea and vomiting. Suspect problems with colostomy back.  EXAM: ABDOMEN - 1 VIEW  COMPARISON:  05/19/2008 abdominal CT.  FINDINGS: Borderline dilated small bowel in the left abdomen (3.5 cm in maximal diameter). No abnormal intra-abdominal mass effect. No urolithiasis suspected. IUD noted. 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  IMPRESSION: Borderline dilated small bowel is central abdomen which could represent early/low grade obstruction or mild ileus.   Electronically Signed   By: Jorje Guild M.D.   On: 08/30/2013 00:23   Dg Abd Portable 1v  08/30/2013   CLINICAL DATA:  NG tube placement  EXAM: PORTABLE ABDOMEN - 1 VIEW  COMPARISON:  08/30/2013  FINDINGS: NG tube enters stomach, loops in the fundus with the tip directed into the stomach body. Heart is enlarged. Mild distention of the small bowel in the mid abdomen. Postop changes of the lower lumbar spine.  IMPRESSION: NG tube  within the stomach.  Similar small bowel distension.   Electronically Signed   By: Daryll Brod M.D.   On: 08/30/2013 07:16    Medications: . enoxaparin (LOVENOX) injection  40 mg Subcutaneous Q24H  . ondansetron      . sodium chloride  3 mL Intravenous Q12H    Assessment/Plan SBO with history of ulcerative colitis and colostomy/ileostomy. PATIENT DOES NOT WANT ANY OF HER FAMILY TO KNOW THAT SHE EVER HAD A COLECTOMY AND HAS AN OSTOMY. dtr is Network engineer in [employer]. Renal Insuffiencey (creatine 1.64 last PM, 1.57 this AM) Hypokalemia and hypomagnesemia Asthma Bronchitis Hypertension.   Plan:  She need further hydration, correction of  the K+ and mag.  K+ needs to be in the 4.0 range.  She has no prior renal issues. I will increase her IV volume some with some KCL and give her some Magnesium.. Recheck labs at 1800 and AM.    LOS: 0 days    Delman Goshorn 08/30/2013

## 2013-08-30 NOTE — Consult Note (Signed)
Reason for Consult:SBO Referring Physician: Riki Altes MD  Jackie Parker is an 72 y.o. female.  HPI: Asked to see at request  Of Dr Stevie Kern for SBO.  1 DAY HX OF ABDOMINAL PAIN AND NO ILEOSTOMY OUTPUT.  Ileostomy in place since 1977 for colitis.  Doesn't want family to know about ostomy. Pain better with NGT.  Pain crampy in nature diffuse and severe relieved with NGT.   Past Medical History  Diagnosis Date  . Asthma   . Hypertension   . Bronchitis   . Colitis, ulcerative     Past Surgical History  Procedure Laterality Date  . Colostomy    . Ileostomy      No family history on file.  Social History:  reports that she has never smoked. She does not have any smokeless tobacco history on file. She reports that she does not drink alcohol or use illicit drugs.  Allergies:  Allergies  Allergen Reactions  . Sulfonamide Derivatives     REACTION: rash    Medications: I have reviewed the patient's current medications.  Results for orders placed during the hospital encounter of 08/30/13 (from the past 48 hour(s))  CBC WITH DIFFERENTIAL     Status: Abnormal   Collection Time    08/29/13 11:33 PM      Result Value Range   WBC 12.3 (*) 4.0 - 10.5 K/uL   RBC 4.56  3.87 - 5.11 MIL/uL   Hemoglobin 15.2 (*) 12.0 - 15.0 g/dL   HCT 42.5  36.0 - 46.0 %   MCV 93.2  78.0 - 100.0 fL   MCH 33.3  26.0 - 34.0 pg   MCHC 35.8  30.0 - 36.0 g/dL   RDW 13.2  11.5 - 15.5 %   Platelets 219  150 - 400 K/uL   Neutrophils Relative % 65  43 - 77 %   Neutro Abs 8.0 (*) 1.7 - 7.7 K/uL   Lymphocytes Relative 25  12 - 46 %   Lymphs Abs 3.1  0.7 - 4.0 K/uL   Monocytes Relative 10  3 - 12 %   Monocytes Absolute 1.2 (*) 0.1 - 1.0 K/uL   Eosinophils Relative 0  0 - 5 %   Eosinophils Absolute 0.0  0.0 - 0.7 K/uL   Basophils Relative 0  0 - 1 %   Basophils Absolute 0.0  0.0 - 0.1 K/uL  COMPREHENSIVE METABOLIC PANEL     Status: Abnormal   Collection Time    08/29/13 11:33 PM      Result Value Range   Sodium 138  135 - 145 mEq/L   Potassium 2.8 (*) 3.5 - 5.1 mEq/L   Chloride 106  96 - 112 mEq/L   CO2 19  19 - 32 mEq/L   Glucose, Bld 143 (*) 70 - 99 mg/dL   BUN 19  6 - 23 mg/dL   Creatinine, Ser 1.64 (*) 0.50 - 1.10 mg/dL   Calcium 8.9  8.4 - 10.5 mg/dL   Total Protein 8.3  6.0 - 8.3 g/dL   Albumin 3.0 (*) 3.5 - 5.2 g/dL   AST 47 (*) 0 - 37 U/L   ALT 32  0 - 35 U/L   Alkaline Phosphatase 101  39 - 117 U/L   Total Bilirubin 1.1  0.3 - 1.2 mg/dL   GFR calc non Af Amer 30 (*) >90 mL/min   GFR calc Af Amer 35 (*) >90 mL/min   Comment: (NOTE)     The eGFR  has been calculated using the CKD EPI equation.     This calculation has not been validated in all clinical situations.     eGFR's persistently <90 mL/min signify possible Chronic Kidney     Disease.    Dg Abd 1 View  08/30/2013   CLINICAL DATA:  Nausea and vomiting. Suspect problems with colostomy back.  EXAM: ABDOMEN - 1 VIEW  COMPARISON:  05/19/2008 abdominal CT.  FINDINGS: Borderline dilated small bowel in the left abdomen (3.5 cm in maximal diameter). No abnormal intra-abdominal mass effect. No urolithiasis suspected. IUD noted. 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  IMPRESSION: Borderline dilated small bowel is central abdomen which could represent early/low grade obstruction or mild ileus.   Electronically Signed   By: Jorje Guild M.D.   On: 08/30/2013 00:23    Review of Systems  Constitutional: Negative for fever and chills.  HENT: Negative.   Gastrointestinal: Positive for nausea, vomiting and abdominal pain.  Genitourinary: Negative.   Musculoskeletal: Negative.   Skin: Negative.   Neurological: Negative.   Endo/Heme/Allergies: Negative.   Psychiatric/Behavioral: Negative.    Blood pressure 166/102, pulse 131, temperature 98.9 F (37.2 C), temperature source Oral, resp. rate 18, SpO2 98.00%. Physical Exam  Constitutional: She is oriented to person, place, and time. She appears well-developed and  well-nourished. No distress.  HENT:  Head: Normocephalic and atraumatic.  NGT  Neck: Normal range of motion. Neck supple.  Cardiovascular: Normal rate and regular rhythm.   Respiratory: Effort normal.  GI: She exhibits distension. There is no tenderness. There is no rebound and no guarding.  OSTOMY IN PLACE.  SURGICAL SCARS  Musculoskeletal: Normal range of motion.  Neurological: She is alert and oriented to person, place, and time.  Skin: Skin is warm and dry.  Psychiatric: She has a normal mood and affect. Her behavior is normal. Judgment and thought content normal.    Assessment/Plan: SBO Agree with NGT/IVF and follow exam and films.  No acute surgical need.  Will follow.  HYPOKALEMIA  BEING REPLACED Patient Active Problem List   Diagnosis Date Noted  . Small bowel obstruction, partial 08/30/2013  . Acute renal failure 08/30/2013  . 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 N&V (nausea and vomiting) 08/30/2013  . Abdominal pain, acute 08/30/2013  . SBO (small bowel obstruction) 08/30/2013  . Postmenopausal bleeding 03/18/2013  . OSTEOARTHRITIS, KNEE, LEFT 03/20/2010  . TRICUSPID REGURGITATION, MILD 03/03/2010  . RENAL INSUFFICIENCY 03/02/2010  . MORBID OBESITY 02/18/2010  . PREMATURE ATRIAL CONTRACTIONS 02/18/2010  . PREMATURE VENTRICULAR CONTRACTIONS 02/18/2010  . CARDIAC MURMUR 02/18/2010  . EDEMA 10/20/2009  . HYPOKALEMIA 07/30/2009  . RIB PAIN, RIGHT SIDED 05/26/2008  . ABDOMINAL PAIN, LEFT LOWER QUADRANT 12/31/2007  . ALLERGIC RHINITIS 06/25/2007  . ABNORMAL BLOOD CHEMISTRY , OTHER 06/25/2007  . UNSPECIFIED VITAMIN D DEFICIENCY 04/30/2007  . HYPOCALCEMIA 04/19/2007  . GLAUCOMA NEC 04/19/2007  . HYPERTENSION, ESSENTIAL NOS 04/19/2007  . ASTHMA 04/19/2007  . COLITIS, ULCERATIVE NOS 04/19/2007  . DISORDER, MENSTRUAL NEC 04/19/2007  . PSORIASIS 04/19/2007  . 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 GOUT NOS 02/21/2006   Jackie Parker A. 08/30/2013, 2:57  AM

## 2013-08-30 NOTE — Progress Notes (Signed)
Has some liquid stool in ileostomy. Continue NGT today. Patient examined and I agree with the assessment and plan  Georganna Skeans, MD, MPH, FACS Pager: [phone]  08/30/2013 3:43 PM

## 2013-08-30 NOTE — ED Notes (Signed)
Report to [facility] on [address]. Pt to floor on monitor.

## 2013-08-31 DIAGNOSIS — I10 Essential (primary) hypertension: Secondary | ICD-10-CM

## 2013-08-31 LAB — BASIC METABOLIC PANEL
BUN: 28 mg/dL — ABNORMAL HIGH (ref 6–23)
CO2: 19 mEq/L (ref 19–32)
Calcium: 8.1 mg/dL — ABNORMAL LOW (ref 8.4–10.5)
Chloride: 115 mEq/L — ABNORMAL HIGH (ref 96–112)
Creatinine, Ser: 2.28 mg/dL — ABNORMAL HIGH (ref 0.50–1.10)
GFR calc Af Amer: 24 mL/min — ABNORMAL LOW (ref >90)
GFR calc non Af Amer: 20 mL/min — ABNORMAL LOW (ref >90)
Glucose, Bld: 81 mg/dL (ref 70–99)
Potassium: 4 mEq/L (ref 3.5–5.1)
Sodium: 146 mEq/L — ABNORMAL HIGH (ref 135–145)

## 2013-08-31 LAB — CBC
HCT: 39 % (ref 36.0–46.0)
Hemoglobin: 13.1 g/dL (ref 12.0–15.0)
MCH: 32.6 pg (ref 26.0–34.0)
MCHC: 33.6 g/dL (ref 30.0–36.0)
MCV: 97 fL (ref 78.0–100.0)
Platelets: 196 10*3/uL (ref 150–400)
RBC: 4.02 MIL/uL (ref 3.87–5.11)
RDW: 13.8 % (ref 11.5–15.5)
WBC: 15.6 10*3/uL — ABNORMAL HIGH (ref 4.0–10.5)

## 2013-08-31 LAB — URINE CULTURE: Colony Count: >=100000

## 2013-08-31 LAB — MAGNESIUM: Magnesium: 2.2 mg/dL (ref 1.5–2.5)

## 2013-08-31 MED ORDER — HYDRALAZINE HCL 20 MG/ML IJ SOLN: 10.0000 mg | Freq: Three times a day (TID) | INTRAMUSCULAR | Status: DC | PRN

## 2013-08-31 MED ORDER — SODIUM CHLORIDE 0.9 % IV BOLUS (SEPSIS)
1000.0000 mL | Freq: Once | INTRAVENOUS | Status: AC
  Administered 2013-08-31: 1000 mL via INTRAVENOUS

## 2013-08-31 NOTE — Progress Notes (Signed)
[facility] PROGRESS NOTE  Jackie Parker ALP:[id number] DOB: 08-12-1941 DOA: 08/30/2013 PCP: Mack Hook, MD  Assessment/Plan: 1-abd pain N/V due to Small bowel obstruction, partial: -feeling better -mild liquid output on ostomy now -continue medical management -abd x-ray in am -will follow surgery rec's  2-Acute renal failure: Cr level higher today. Reports good urine output -will monitor -follow urine cx -continue IVF's  3-Hypokalemia and hypomagnesemia: due to N/V. repleted -will monitor and correct as needed  4-HTN: stable. Will monitor for now. Due to NPO status will hold po meds. Continue IVF's -hydralazine PRN if needed  5-Asthma: stable. Continue Advair  DVT: lovenox  Code Status: Full Family Communication: no family at bedside  Disposition Plan: home when medically stable   Consultants:  General surgery service  Procedures:  See below for x-ray reports   Antibiotics:  None   HPI/Subjective: No CP. No SOB. Afebrile and feeling much better   Objective: Filed Vitals:   08/31/13 0429  BP: 104/65  Pulse: 65  Temp: 98.2 F (36.8 C)  Resp:     Intake/Output Summary (Last 24 hours) at 08/31/13 1153 Last data filed at 08/31/13 1025  Gross per 24 hour  Intake    500 ml  Output   1200 ml  Net   -700 ml   Filed Weights   08/30/13 0413 08/31/13 0429  Weight: 123.968 kg (273 lb 4.8 oz) 106.369 kg (234 lb 8 oz)    Exam:   General:  No fever, feeling better, no CP or SOB  Cardiovascular: S1 and s2, no rubs or gallops; positive SEM  Respiratory: CTA bilaterally  Abdomen: soft, NT, minimal liquid output on ostomy; patient with decreased BS  Musculoskeletal: no edema or erythema  Data Reviewed: Basic Metabolic Panel:  Recent Labs Lab 08/29/13 2333 08/30/13 0630 08/30/13 1830 08/31/13 0438  NA 138 140 142 146*  K 2.8* 3.2* 3.4* 4.0  CL 106 109 111 115*  CO2 19 21 21 19   GLUCOSE 143* 118* 98 81  BUN 19 19 23  28*   CREATININE 1.64* 1.57* 1.78* 2.28*  CALCIUM 8.9 8.1* 8.2* 8.1*  MG  --  1.4*  --  2.2   Liver Function Tests:  Recent Labs Lab 08/29/13 2333  AST 47*  ALT 32  ALKPHOS 101  BILITOT 1.1  PROT 8.3  ALBUMIN 3.0*    Recent Labs Lab 08/29/13 2312  LIPASE 42   CBC:  Recent Labs Lab 08/29/13 2333 08/30/13 0630 08/31/13 0438  WBC 12.3* 11.8* 15.6*  NEUTROABS 8.0*  --   --   HGB 15.2* 13.5 13.1  HCT 42.5 38.6 39.0  MCV 93.2 94.1 97.0  PLT 219 200 196    Studies: Dg Abd 1 View  08/30/2013   CLINICAL DATA:  Lower abdominal pain.  EXAM: ABDOMEN - 1 VIEW  COMPARISON:  Radiography from yesterday  FINDINGS: Mildly dilated bowel in the left abdomen is again seen, currently 4 cm. Fluid levels are newly seen in the right abdomen. The patient is status post colectomy per prior CT imaging. IUD again noted. No pneumoperitoneum seen.  IMPRESSION: 1. Negative for pneumoperitoneum. 2. Dilated small bowel with fluid levels, suggesting ileus or obstruction.   Electronically Signed   By: Jorje Guild M.D.   On: 08/30/2013 03:21   Dg Abd 1 View  08/30/2013   CLINICAL DATA:  Nausea and vomiting. Suspect problems with colostomy back.  EXAM: ABDOMEN - 1 VIEW  COMPARISON:  05/19/2008 abdominal CT.  FINDINGS: Borderline dilated small  bowel in the left abdomen (3.5 cm in maximal diameter). No abnormal intra-abdominal mass effect. No urolithiasis suspected. IUD noted. 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  IMPRESSION: Borderline dilated small bowel is central abdomen which could represent early/low grade obstruction or mild ileus.   Electronically Signed   By: Jorje Guild M.D.   On: 08/30/2013 00:23   Dg Abd Portable 1v  08/30/2013   CLINICAL DATA:  NG tube placement  EXAM: PORTABLE ABDOMEN - 1 VIEW  COMPARISON:  08/30/2013  FINDINGS: NG tube enters stomach, loops in the fundus with the tip directed into the stomach body. Heart is enlarged. Mild distention of the small bowel in the mid  abdomen. Postop changes of the lower lumbar spine.  IMPRESSION: NG tube within the stomach.  Similar small bowel distension.   Electronically Signed   By: Daryll Brod M.D.   On: 08/30/2013 07:16    Scheduled Meds: . enoxaparin (LOVENOX) injection  40 mg Subcutaneous Q24H  . sodium chloride  1,000 mL Intravenous Once  . sodium chloride  3 mL Intravenous Q12H   Continuous Infusions: . 0.9 % NaCl with KCl 40 mEq / L 125 mL/hr at 08/30/13 2000     Time spent: >30 minutes   Jackie Parker  [facility] Pager [phone]. If 7PM-7AM, please contact night-coverage at [email or web address], password [facility] 08/31/2013, 11:53 AM  LOS: 1 day

## 2013-08-31 NOTE — Progress Notes (Signed)
  Subjective: She reports feeling much better this morning.  Minimal abdominal pain  Objective: Vital signs in last 24 hours: Temp:  [97.7 F (36.5 C)-98.2 F (36.8 C)] 98.2 F (36.8 C) (12/20 0429) Pulse Rate:  [65-78] 65 (12/20 0429) Resp:  [18] 18 (12/19 1343) BP: (104-125)/(59-74) 104/65 mmHg (12/20 0429) SpO2:  [95 %-97 %] 95 % (12/20 0429) Weight:  [234 lb 8 oz (106.369 kg)] 234 lb 8 oz (106.369 kg) (12/20 0429) Last BM Date: 08/23/13 (Colostomy)  Intake/Output from previous day: 12/19 0701 - 12/20 0700 In: 503 [I.V.:503] Out: 750 [Urine:300; Emesis/NG output:450] Intake/Output this shift: Total I/O In: -  Out: 250 [Urine:250]  Looks comfortable Abdomen soft, non tender, liquid now in ostomy bag  Lab Results:   Recent Labs  08/30/13 0630 08/31/13 0438  WBC 11.8* 15.6*  HGB 13.5 13.1  HCT 38.6 39.0  PLT 200 196   BMET  Recent Labs  08/30/13 1830 08/31/13 0438  NA 142 146*  K 3.4* 4.0  CL 111 115*  CO2 21 19  GLUCOSE 98 81  BUN 23 28*  CREATININE 1.78* 2.28*  CALCIUM 8.2* 8.1*   PT/INR No results found for this basename: LABPROT, INR,  in the last 72 hours ABG No results found for this basename: PHART, PCO2, PO2, HCO3,  in the last 72 hours  Studies/Results: Dg Abd 1 View  08/30/2013   CLINICAL DATA:  Lower abdominal pain.  EXAM: ABDOMEN - 1 VIEW  COMPARISON:  Radiography from yesterday  FINDINGS: Mildly dilated bowel in the left abdomen is again seen, currently 4 cm. Fluid levels are newly seen in the right abdomen. The patient is status post colectomy per prior CT imaging. IUD again noted. No pneumoperitoneum seen.  IMPRESSION: 1. Negative for pneumoperitoneum. 2. Dilated small bowel with fluid levels, suggesting ileus or obstruction.   Electronically Signed   By: Jorje Guild M.D.   On: 08/30/2013 03:21   Dg Abd 1 View  08/30/2013   CLINICAL DATA:  Nausea and vomiting. Suspect problems with colostomy back.  EXAM: ABDOMEN - 1 VIEW   COMPARISON:  05/19/2008 abdominal CT.  FINDINGS: Borderline dilated small bowel in the left abdomen (3.5 cm in maximal diameter). No abnormal intra-abdominal mass effect. No urolithiasis suspected. IUD noted. 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  IMPRESSION: Borderline dilated small bowel is central abdomen which could represent early/low grade obstruction or mild ileus.   Electronically Signed   By: Jorje Guild M.D.   On: 08/30/2013 00:23   Dg Abd Portable 1v  08/30/2013   CLINICAL DATA:  NG tube placement  EXAM: PORTABLE ABDOMEN - 1 VIEW  COMPARISON:  08/30/2013  FINDINGS: NG tube enters stomach, loops in the fundus with the tip directed into the stomach body. Heart is enlarged. Mild distention of the small bowel in the mid abdomen. Postop changes of the lower lumbar spine.  IMPRESSION: NG tube within the stomach.  Similar small bowel distension.   Electronically Signed   By: Daryll Brod M.D.   On: 08/30/2013 07:16    Anti-infectives: Anti-infectives   None      Assessment/Plan: s/p * No surgery found *  SBO  Clinically patient looks much better, but labs worse.  Will bolus IVF and repeat in the morning Continue NPO and NG for now  LOS: 1 day    Jackie Parker A 08/31/2013

## 2013-09-01 ENCOUNTER — Inpatient Hospital Stay (HOSPITAL_COMMUNITY): Payer: Medicare Other

## 2013-09-01 DIAGNOSIS — N179 Acute kidney failure, unspecified: Secondary | ICD-10-CM

## 2013-09-01 DIAGNOSIS — E872 Acidosis: Secondary | ICD-10-CM

## 2013-09-01 LAB — BASIC METABOLIC PANEL
BUN: 27 mg/dL — ABNORMAL HIGH (ref 6–23)
CO2: 16 mEq/L — ABNORMAL LOW (ref 19–32)
Calcium: 8 mg/dL — ABNORMAL LOW (ref 8.4–10.5)
Chloride: 119 mEq/L — ABNORMAL HIGH (ref 96–112)
Creatinine, Ser: 1.82 mg/dL — ABNORMAL HIGH (ref 0.50–1.10)
GFR calc Af Amer: 31 mL/min — ABNORMAL LOW (ref >90)
GFR calc non Af Amer: 27 mL/min — ABNORMAL LOW (ref >90)
Glucose, Bld: 70 mg/dL (ref 70–99)
Potassium: 4 mEq/L (ref 3.5–5.1)
Sodium: 149 mEq/L — ABNORMAL HIGH (ref 135–145)

## 2013-09-01 LAB — CBC
HCT: 36.5 % (ref 36.0–46.0)
Hemoglobin: 12 g/dL (ref 12.0–15.0)
MCH: 32.2 pg (ref 26.0–34.0)
MCHC: 32.9 g/dL (ref 30.0–36.0)
MCV: 97.9 fL (ref 78.0–100.0)
Platelets: 191 10*3/uL (ref 150–400)
RBC: 3.73 MIL/uL — ABNORMAL LOW (ref 3.87–5.11)
RDW: 14.2 % (ref 11.5–15.5)
WBC: 12.3 10*3/uL — ABNORMAL HIGH (ref 4.0–10.5)

## 2013-09-01 LAB — MAGNESIUM: Magnesium: 1.7 mg/dL (ref 1.5–2.5)

## 2013-09-01 MED ORDER — SODIUM BICARBONATE 8.4 % IV SOLN
INTRAVENOUS | Status: DC
  Administered 2013-09-01 – 2013-09-02 (×3): via INTRAVENOUS
  Filled 2013-09-01 (×4): qty 100

## 2013-09-01 MED ORDER — PHENOL 1.4 % MT LIQD
1.0000 | OROMUCOSAL | Status: DC | PRN
  Administered 2013-09-01: 1 via OROMUCOSAL
  Filled 2013-09-01: qty 177

## 2013-09-01 NOTE — Progress Notes (Addendum)
  Subjective: PT feels better today. Ostomy functioning with output.  Objective: Vital signs in last 24 hours: Temp:  [98.2 F (36.8 C)-98.5 F (36.9 C)] 98.4 F (36.9 C) (12/21 0533) Pulse Rate:  [84-95] 95 (12/21 0533) Resp:  [18-19] 18 (12/21 0533) BP: (130-139)/(68-78) 139/73 mmHg (12/21 0533) SpO2:  [93 %-100 %] 93 % (12/21 0533) Weight:  [233 lb 4 oz (105.8 kg)] 233 lb 4 oz (105.8 kg) (12/21 0533) Last BM Date: 09/01/13  Intake/Output from previous day: 12/20 0701 - 12/21 0700 In: 3875 [I.V.:3875] Out: 1200 [Urine:250; Emesis/NG output:550; Stool:400] Intake/Output this shift:    General appearance: alert and cooperative GI: soft, NTTP, ND, ostomy with liq stool, min gas  Lab Results:   Recent Labs  08/31/13 0438 09/01/13 0537  WBC 15.6* 12.3*  HGB 13.1 12.0  HCT 39.0 36.5  PLT 196 191   BMET  Recent Labs  08/31/13 0438 09/01/13 0537  NA 146* 149*  K 4.0 4.0  CL 115* 119*  CO2 19 16*  GLUCOSE 81 70  BUN 28* 27*  CREATININE 2.28* 1.82*  CALCIUM 8.1* 8.0*   PT/INR No results found for this basename: LABPROT, INR,  in the last 72 hours ABG No results found for this basename: PHART, PCO2, PO2, HCO3,  in the last 72 hours  Studies/Results: Dg Abd 1 View  09/01/2013   CLINICAL DATA:  Small bowel obstruction.  EXAM: ABDOMEN - 1 VIEW  COMPARISON:  08/30/2013.  FINDINGS: Nasogastric tube is coiled in the stomach with the tip in the antral pre-pyloric region. Some gas and stool are noted throughout the colon. No pathologic distention of small bowel. IUD in position in the central pelvis. Postoperative changes are noted in the lower lumbar spine, with interbody cages at the L4-L5 interspace. No gross evidence of pneumoperitoneum on this single supine view of the abdomen.  IMPRESSION: 1. Nonspecific nonobstructive bowel gas pattern, as above. 2. No pneumoperitoneum. 3. Postoperative changes and support apparatus, as above.   Electronically Signed   By: Vinnie Langton M.D.   On: 09/01/2013 07:30    Anti-infectives: Anti-infectives   None      Assessment/Plan: SBO  Clamp NGT May likely be able to remove NGT if con't to do well. Labs improved today Encourage ambulation  LOS: 2 days    Rosario Jacks., Anne Hahn 09/01/2013

## 2013-09-01 NOTE — Progress Notes (Signed)
Patient ambulated in hallway with staff assistance and tolerated well.  No complaints.

## 2013-09-01 NOTE — Progress Notes (Signed)
[facility] PROGRESS NOTE  Jackie Parker NMM:[id number] DOB: May 26, 1941 DOA: 08/30/2013 PCP: Mack Hook, MD  Assessment/Plan: 1-abd pain N/V due to Small bowel obstruction, partial: -feeling better; no nausea, no vomiting and increased output on ostomy -continue medical management -abd x-ray demonstrating no obstruction -plan is to clamp NGT today -will follow surgery rec's  2-Acute renal failure: Cr level improving. Reports good urine output -will monitor; Cr 1.8 -follow urine cx -continue IVF's -holding nephrotoxic agents -will give some bicarb  3-Hypokalemia and hypomagnesemia: due to N/V. repleted -will monitor and correct as needed  4-HTN: stable. Will monitor for now. Due to NPO status will hold po meds. Continue IVF's -hydralazine PRN if needed  5-Asthma: stable. Continue Advair  6-metabolic acidosis: will give some bicarb  DVT: lovenox  Code Status: Full Family Communication: no family at bedside  Disposition Plan: home when medically stable   Consultants:  General surgery service  Procedures:  See below for x-ray reports   Antibiotics:  None   HPI/Subjective: No CP. No SOB. Afebrile and feeling much better. No nausea, no vomiting and no abd pain  Objective: Filed Vitals:   09/01/13 1300  BP: 118/63  Pulse: 91  Temp: 98.8 F (37.1 C)  Resp: 20    Intake/Output Summary (Last 24 hours) at 09/01/13 1558 Last data filed at 09/01/13 1100  Gross per 24 hour  Intake   3875 ml  Output    650 ml  Net   3225 ml   Filed Weights   08/30/13 0413 08/31/13 0429 09/01/13 0533  Weight: 123.968 kg (273 lb 4.8 oz) 106.369 kg (234 lb 8 oz) 105.8 kg (233 lb 4 oz)    Exam:   General:  No fever, feeling better, no CP or SOB  Cardiovascular: S1 and s2, no rubs or gallops; positive SEM  Respiratory: CTA bilaterally  Abdomen: soft, NT, minimal liquid output on ostomy; patient with decreased BS  Musculoskeletal: no edema or  erythema  Data Reviewed: Basic Metabolic Panel:  Recent Labs Lab 08/29/13 2333 08/30/13 0630 08/30/13 1830 08/31/13 0438 09/01/13 0537  NA 138 140 142 146* 149*  K 2.8* 3.2* 3.4* 4.0 4.0  CL 106 109 111 115* 119*  CO2 19 21 21 19  16*  GLUCOSE 143* 118* 98 81 70  BUN 19 19 23  28* 27*  CREATININE 1.64* 1.57* 1.78* 2.28* 1.82*  CALCIUM 8.9 8.1* 8.2* 8.1* 8.0*  MG  --  1.4*  --  2.2 1.7   Liver Function Tests:  Recent Labs Lab 08/29/13 2333  AST 47*  ALT 32  ALKPHOS 101  BILITOT 1.1  PROT 8.3  ALBUMIN 3.0*    Recent Labs Lab 08/29/13 2312  LIPASE 42   CBC:  Recent Labs Lab 08/29/13 2333 08/30/13 0630 08/31/13 0438 09/01/13 0537  WBC 12.3* 11.8* 15.6* 12.3*  NEUTROABS 8.0*  --   --   --   HGB 15.2* 13.5 13.1 12.0  HCT 42.5 38.6 39.0 36.5  MCV 93.2 94.1 97.0 97.9  PLT 219 200 196 191    Studies: Dg Abd 1 View  09/01/2013   CLINICAL DATA:  Small bowel obstruction.  EXAM: ABDOMEN - 1 VIEW  COMPARISON:  08/30/2013.  FINDINGS: Nasogastric tube is coiled in the stomach with the tip in the antral pre-pyloric region. Some gas and stool are noted throughout the colon. No pathologic distention of small bowel. IUD in position in the central pelvis. Postoperative changes are noted in the lower lumbar spine, with interbody cages at  the L4-L5 interspace. No gross evidence of pneumoperitoneum on this single supine view of the abdomen.  IMPRESSION: 1. Nonspecific nonobstructive bowel gas pattern, as above. 2. No pneumoperitoneum. 3. Postoperative changes and support apparatus, as above.   Electronically Signed   By: Vinnie Langton M.D.   On: 09/01/2013 07:30    Scheduled Meds: . enoxaparin (LOVENOX) injection  40 mg Subcutaneous Q24H  . sodium chloride  3 mL Intravenous Q12H   Continuous Infusions: .  sodium bicarbonate  infusion 1000 mL       Time spent: >30 minutes   Daeton Kluth  [facility] Pager [phone]. If 7PM-7AM, please contact night-coverage  at [email or web address], password [facility] 09/01/2013, 3:58 PM  LOS: 2 days

## 2013-09-02 LAB — BASIC METABOLIC PANEL
BUN: 21 mg/dL (ref 6–23)
CO2: 20 mEq/L (ref 19–32)
Calcium: 8.2 mg/dL — ABNORMAL LOW (ref 8.4–10.5)
Chloride: 114 mEq/L — ABNORMAL HIGH (ref 96–112)
Creatinine, Ser: 1.48 mg/dL — ABNORMAL HIGH (ref 0.50–1.10)
GFR calc Af Amer: 40 mL/min — ABNORMAL LOW (ref >90)
GFR calc non Af Amer: 34 mL/min — ABNORMAL LOW (ref >90)
Glucose, Bld: 93 mg/dL (ref 70–99)
Potassium: 3.3 mEq/L — ABNORMAL LOW (ref 3.5–5.1)
Sodium: 143 mEq/L (ref 135–145)

## 2013-09-02 MED ORDER — POTASSIUM CHLORIDE CRYS ER 20 MEQ PO TBCR
40.0000 meq | EXTENDED_RELEASE_TABLET | Freq: Once | ORAL | Status: AC
  Administered 2013-09-02: 40 meq via ORAL
  Filled 2013-09-02: qty 2

## 2013-09-02 NOTE — Progress Notes (Signed)
[facility] PROGRESS NOTE  Jackie Parker HER:[id number] DOB: August 29, 1941 DOA: 08/30/2013 PCP: Mack Hook, MD  Assessment/Plan: 1-abd pain N/V due to Small bowel obstruction, partial: -feeling better; no nausea, no vomiting and increased output on ostomy -most likely home in am -abd x-ray demonstrating no obstruction -NGT removed and has tolerated CLD -will advance die to low fiber -will follow surgery rec's  2-Acute renal failure: Cr level improving. Reports good urine output -will monitor; Cr 1.4 -no UTI -will switch to maintenance IVF's -now eating and drinking  -continue holding nephrotoxic agents  3-Hypokalemia and hypomagnesemia: due to N/V. repleted -will monitor and continue repletion as needed  4-HTN: stable. Will monitor for now. Due to NPO status will hold po meds. Continue IVF's -hydralazine PRN if needed -will start bidil BID at discharge -ARB/HCTZ will be on hold due to renal failure.  5-Asthma: stable. Continue Advair  6-metabolic acidosis: resolved with bicarb  DVT: lovenox  Code Status: Full Family Communication: no family at bedside  Disposition Plan: home when medically stable   Consultants:  General surgery service  Procedures:  See below for x-ray reports   Antibiotics:  None   HPI/Subjective: No CP. No SOB. Afebrile and feeling much better. No nausea, no vomiting and no abd pain tolerated NGT clamped and now is NGT free. Diet has been advance all day and well tolerated CLD.  Objective: Filed Vitals:   09/02/13 1400  BP: 142/69  Pulse: 102  Temp: 98.9 F (37.2 C)  Resp: 16    Intake/Output Summary (Last 24 hours) at 09/02/13 1920 Last data filed at 09/02/13 0600  Gross per 24 hour  Intake  807.5 ml  Output    750 ml  Net   57.5 ml   Filed Weights   08/31/13 0429 09/01/13 0533 09/02/13 0555  Weight: 106.369 kg (234 lb 8 oz) 105.8 kg (233 lb 4 oz) 106.3 kg (234 lb 5.6 oz)    Exam:   General:  No fever,  feeling better, no CP or SOB  Cardiovascular: S1 and s2, no rubs or gallops; positive SEM  Respiratory: CTA bilaterally  Abdomen: soft, NT, positive BS and good output on ostomy  Musculoskeletal: no edema or erythema  Data Reviewed: Basic Metabolic Panel:  Recent Labs Lab 08/30/13 0630 08/30/13 1830 08/31/13 0438 09/01/13 0537 09/02/13 0525  NA 140 142 146* 149* 143  K 3.2* 3.4* 4.0 4.0 3.3*  CL 109 111 115* 119* 114*  CO2 21 21 19  16* 20  GLUCOSE 118* 98 81 70 93  BUN 19 23 28* 27* 21  CREATININE 1.57* 1.78* 2.28* 1.82* 1.48*  CALCIUM 8.1* 8.2* 8.1* 8.0* 8.2*  MG 1.4*  --  2.2 1.7  --    Liver Function Tests:  Recent Labs Lab 08/29/13 2333  AST 47*  ALT 32  ALKPHOS 101  BILITOT 1.1  PROT 8.3  ALBUMIN 3.0*    Recent Labs Lab 08/29/13 2312  LIPASE 42   CBC:  Recent Labs Lab 08/29/13 2333 08/30/13 0630 08/31/13 0438 09/01/13 0537  WBC 12.3* 11.8* 15.6* 12.3*  NEUTROABS 8.0*  --   --   --   HGB 15.2* 13.5 13.1 12.0  HCT 42.5 38.6 39.0 36.5  MCV 93.2 94.1 97.0 97.9  PLT 219 200 196 191    Studies: Dg Abd 1 View  09/01/2013   CLINICAL DATA:  Small bowel obstruction.  EXAM: ABDOMEN - 1 VIEW  COMPARISON:  08/30/2013.  FINDINGS: Nasogastric tube is coiled in the stomach with  the tip in the antral pre-pyloric region. Some gas and stool are noted throughout the colon. No pathologic distention of small bowel. IUD in position in the central pelvis. Postoperative changes are noted in the lower lumbar spine, with interbody cages at the L4-L5 interspace. No gross evidence of pneumoperitoneum on this single supine view of the abdomen.  IMPRESSION: 1. Nonspecific nonobstructive bowel gas pattern, as above. 2. No pneumoperitoneum. 3. Postoperative changes and support apparatus, as above.   Electronically Signed   By: Vinnie Langton M.D.   On: 09/01/2013 07:30    Scheduled Meds: . enoxaparin (LOVENOX) injection  40 mg Subcutaneous Q24H  . sodium chloride  3 mL  Intravenous Q12H   Continuous Infusions: .  sodium bicarbonate  infusion 1000 mL 75 mL/hr at 09/02/13 1850     Time spent: < 30 minutes   Seville Downs  [facility] Pager [phone]. If 7PM-7AM, please contact night-coverage at [email or web address], password [facility] 09/02/2013, 7:20 PM  LOS: 3 days

## 2013-09-02 NOTE — Care Management Note (Unsigned)
    Page 1 of 1   09/02/2013     4:05:04 PM   CARE MANAGEMENT NOTE 09/02/2013  Patient:  Jackie Parker, Jackie Parker   Account Number:  [id number]  Date Initiated:  09/02/2013  Documentation initiated by:  GRAVES-BIGELOW,Melita Villalona  Subjective/Objective Assessment:   Pt admitted for N/V SBo resolving- NG tube d/c and to begin full liquid diet. Plan for home when medically stable.     Action/Plan:   CM will continue to monitor.   Anticipated DC Date:  09/03/2013   Anticipated DC Plan:  [place]  CM consult      Choice offered to / List presented to:             Status of service:  In process, will continue to follow Medicare Important Message given?   (If response is "NO", the following Medicare IM given date fields will be blank) Date Medicare IM given:   Date Additional Medicare IM given:    Discharge Disposition:    Per UR Regulation:  Reviewed for med. necessity/level of care/duration of stay  If discussed at [place] of Stay Meetings, dates discussed:   09/03/2013    Comments:

## 2013-09-02 NOTE — Progress Notes (Addendum)
  Subjective: No problems since NG tube clamped yesterday. She is hungry. Denies nausea or abdominal pain. Ileostomy working well. Urine output improved, 650 cc recorded last 24 hours.  Potassium 3.3. BUN 21. Creatinine 1.48. Glucose 93.  Objective: Vital signs in last 24 hours: Temp:  [98.3 F (36.8 C)-99.1 F (37.3 C)] 99.1 F (37.3 C) (12/22 0555) Pulse Rate:  [91-95] 93 (12/22 0555) Resp:  [18-20] 18 (12/22 0555) BP: (118-133)/(63-73) 133/71 mmHg (12/22 0555) SpO2:  [95 %-97 %] 95 % (12/22 0555) Weight:  [234 lb 5.6 oz (106.3 kg)] 234 lb 5.6 oz (106.3 kg) (12/22 0555) Last BM Date: 09/01/13  Intake/Output from previous day: 12/21 0701 - 12/22 0700 In: 807.5 [I.V.:807.5] Out: 1250 [Urine:650; Stool:600] Intake/Output this shift:    General appearance:. Alert. Spirits good. No distress. Mental status normal. Obese. GI: abdomen soft. Nondistended. Bowel sounds present. Midline incision well-healed. No hernias. Ileostomy pink. Functioning. no parastomal hernia. Benign exam.  Lab Results:   Recent Labs  08/31/13 0438 09/01/13 0537  WBC 15.6* 12.3*  HGB 13.1 12.0  HCT 39.0 36.5  PLT 196 191   BMET  Recent Labs  09/01/13 0537 09/02/13 0525  NA 149* 143  K 4.0 3.3*  CL 119* 114*  CO2 16* 20  GLUCOSE 70 93  BUN 27* 21  CREATININE 1.82* 1.48*  CALCIUM 8.0* 8.2*   PT/INR No results found for this basename: LABPROT, INR,  in the last 72 hours ABG No results found for this basename: PHART, PCO2, PO2, HCO3,  in the last 72 hours  Studies/Results: Dg Abd 1 View  09/01/2013   CLINICAL DATA:  Small bowel obstruction.  EXAM: ABDOMEN - 1 VIEW  COMPARISON:  08/30/2013.  FINDINGS: Nasogastric tube is coiled in the stomach with the tip in the antral pre-pyloric region. Some gas and stool are noted throughout the colon. No pathologic distention of small bowel. IUD in position in the central pelvis. Postoperative changes are noted in the lower lumbar spine, with interbody  cages at the L4-L5 interspace. No gross evidence of pneumoperitoneum on this single supine view of the abdomen.  IMPRESSION: 1. Nonspecific nonobstructive bowel gas pattern, as above. 2. No pneumoperitoneum. 3. Postoperative changes and support apparatus, as above.   Electronically Signed   By: Vinnie Langton M.D.   On: 09/01/2013 07:30    Anti-infectives: Anti-infectives   None      Assessment/Plan:  Partial SBO. Resolving. First episode per patient history. Discontinue NG tube Full liquid diet Avoid narcotics, sedatives, hypnotics, anticholinergics, etc.  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 Family unaware of ileostomy, patient desires that they not be told about this.  Hypokalemia. K-Dur 40 milliequivalents for 1 dose. Depressed GFR noted. B. Met tomorrow  Acute renal failure. Improving. Per internal medicine service  Hypertension stable Asthma. Stable DVT. Lovenox    LOS: 3 days    Jackie Parker M 09/02/2013

## 2013-09-03 LAB — BASIC METABOLIC PANEL
BUN: 15 mg/dL (ref 6–23)
CO2: 25 mEq/L (ref 19–32)
Calcium: 7.7 mg/dL — ABNORMAL LOW (ref 8.4–10.5)
Chloride: 111 mEq/L (ref 96–112)
Creatinine, Ser: 1.38 mg/dL — ABNORMAL HIGH (ref 0.50–1.10)
GFR calc Af Amer: 43 mL/min — ABNORMAL LOW (ref >90)
GFR calc non Af Amer: 37 mL/min — ABNORMAL LOW (ref >90)
Glucose, Bld: 88 mg/dL (ref 70–99)
Potassium: 3.7 mEq/L (ref 3.5–5.1)
Sodium: 143 mEq/L (ref 135–145)

## 2013-09-03 MED ORDER — ISOSORB DINITRATE-HYDRALAZINE 20-37.5 MG PO TABS: 0.5000 | ORAL_TABLET | Freq: Two times a day (BID) | ORAL | Status: DC

## 2013-09-03 MED ORDER — PHENOL 1.4 % MT LIQD: 1.0000 | OROMUCOSAL | Status: DC | PRN

## 2013-09-03 NOTE — Progress Notes (Signed)
[facility] Progress Note    Subjective: Patient doing well with no more symptoms of obstruction  Objective: Vital signs in last 24 hours: Temp:  [98.1 F (36.7 C)-98.9 F (37.2 C)] 98.1 F (36.7 C) (12/23 0436) Pulse Rate:  [81-102] 81 (12/23 0436) Resp:  [16-20] 20 (12/23 0436) BP: (118-142)/(68-89) 118/68 mmHg (12/23 0436) SpO2:  [92 %-94 %] 94 % (12/23 0436) Weight:  [107.4 kg (236 lb 12.4 oz)] 107.4 kg (236 lb 12.4 oz) (12/23 0541) Last BM Date: 09/02/13  Intake/Output from previous day: 12/22 0701 - 12/23 0700 In: 1533.3 [I.V.:1533.3] Out: 750 [Urine:200; Stool:550] Intake/Output this shift:    General: No acute distress  Lungs: Clear  Abd: Soft, good bowel sounds.  Ileostomy working well   Extremities: No changes  Neuro: Intact  Lab Results:  @LABLAST2 (wbc:2,hgb:2,hct:2,plt:2) BMET  Recent Labs  09/02/13 0525 09/03/13 0445  NA 143 143  K 3.3* 3.7  CL 114* 111  CO2 20 25  GLUCOSE 93 88  BUN 21 15  CREATININE 1.48* 1.38*  CALCIUM 8.2* 7.7*   PT/INR No results found for this basename: LABPROT, INR,  in the last 72 hours ABG No results found for this basename: PHART, PCO2, PO2, HCO3,  in the last 72 hours  Studies/Results: No results found.  Anti-infectives: Anti-infectives   None      Assessment/Plan: s/p  Advance diet Discharge No continuing surgical issues.  LOS: 4 days   Kathryne Eriksson. Dahlia Bailiff, MD, FACS [phone] [facility] Surgery 09/03/2013

## 2013-09-03 NOTE — Discharge Summary (Signed)
Physician Discharge Summary  Jackie Parker EPP:[id number] DOB: 27-Mar-1941 DOA: 08/30/2013  PCP: Mack Hook, MD  Admit date: 08/30/2013 Discharge date: 09/03/2013  Time spent: >30 minutes  Recommendations for Outpatient Follow-up:  1. BMET to follow renal function and electrolytes 2. Reassess BP and adjust medications as needed (valsartan/HCTZ discontinue due to renal failure)  Discharge Diagnoses:  Principal Problem:   Small bowel obstruction, partial Active Problems:   COLITIS, ULCERATIVE NOS   Acute renal failure   S/p total colectomy in 1977   N&V (nausea and vomiting)   Abdominal pain, acute   SBO (small bowel obstruction)   Hypokalemia   Partial small bowel obstruction   Discharge Condition: stable and improved. No further nausea, vomiting, abd pain or any other acute complaints. Patient tolerating low residue diet and with good ostomy output. 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 No fevers. She has never had a problem with her ostomy before. She has received ngt decompression in ED with iv pain meds and feels much better. Has had no sick contacts. Pt has already been evaluated by surgery.   Hospital Course:  1-abd pain N/V due to Small bowel obstruction, partial:  -abd x-ray demonstrating resolution of partial obstruction  -tolerating low residue diet, good ostomy output, no nausea, no vomiting. -will discharge home  2-Acute renal failure: Cr level continue improving. Reports good  urine output  -will monitor BMET on ; Cr 1.38 at discharge  -no UTI  -now eating and drinking  -continue holding nephrotoxic agents until visit with PCP  3-Hypokalemia and hypomagnesemia: due to N/V and poor PO intake. Repleted and WNL at discharge -will repeat BMET during follow up with PCP  4-HTN: stable. Will monitor for now. Due to NPO status will hold po meds. Continue IVF's  -patient advise to follow low sodium diet -will start bidil BID at discharge  -ARB/HCTZ will be on hold due to renal failure.   5-Asthma: stable. Continue Advair and singulair  6-metabolic acidosis: resolved with bicarb and after improvement of renal failure.   Procedures: See below for x-ray reports   Consultations:  General surgery   Discharge Exam: Filed Vitals:   09/03/13 0436  BP: 118/68  Pulse: 81  Temp: 98.1 F (36.7 C)  Resp: 20   General: No fever, no N/V, feeling better, no CP or SOB. Patient has tolerated low residue diet Cardiovascular: S1 and s2, no rubs or gallops; positive SEM  Respiratory: CTA bilaterally  Abdomen: soft, NT, positive BS and good output on ostomy  Musculoskeletal: no edema or erythema   Discharge Instructions  Discharge Orders   Future Orders Complete By Expires   Discharge instructions  As directed    Comments:     Take medications as prescribed Follow a low fiber and low sodium diet Keep yourself well hydrated Stop medication Valsartan-HCTZ       Medication List    STOP taking these medications       valsartan-hydrochlorothiazide 160-25 MG  per tablet  Commonly known as:  DIOVAN-HCT      TAKE these medications       bimatoprost 0.03 % ophthalmic solution  Commonly known as:  LUMIGAN  Place 1 drop into both eyes 2 (two) times daily.     cetirizine 10 MG tablet  Commonly known as:  ZYRTEC  Take 10 mg by mouth daily.     Fluticasone-Salmeterol 250-50 MCG/DOSE Aepb  Commonly known as:  ADVAIR  Inhale 1 puff into the lungs every 12  (twelve) hours.     isosorbide-hydrALAZINE 20-37.5 MG per tablet  Commonly known as:  BIDIL  Take 0.5 tablets by mouth 2 (two) times daily.     montelukast 10 MG tablet  Commonly known as:  SINGULAIR  Take 10 mg by mouth at bedtime.     phenol 1.4 % Liqd  Commonly known as:  CHLORASEPTIC  Use as directed 1 spray in the mouth or throat every 4 (four) hours as needed for throat irritation / pain.     VITAMIN D PO  Take 1 tablet by mouth daily.       Allergies  Allergen Reactions  . Sulfonamide Derivatives     REACTION: rash       Follow-up Information   Follow up with [facility], MD. Schedule an appointment as soon as possible for a visit in 10 days.   Specialty:  Internal Medicine   Contact information:   [place] [id number] [phone]       The results of significant diagnostics from this hospitalization (including imaging, microbiology, ancillary and laboratory) are listed below for reference.    Significant Diagnostic Studies: Dg Abd 1 View  09/01/2013   CLINICAL DATA:  Small bowel obstruction.  EXAM: ABDOMEN - 1 VIEW  COMPARISON:  08/30/2013.  FINDINGS: Nasogastric tube is coiled in the stomach with the tip in the antral pre-pyloric region. Some gas and stool are noted throughout the colon. No pathologic distention of small bowel. IUD in position in the central pelvis. Postoperative changes are noted in the lower lumbar spine, with interbody cages at the L4-L5 interspace. No gross evidence of pneumoperitoneum on this single supine view of the abdomen.  IMPRESSION: 1. Nonspecific nonobstructive bowel gas pattern, as above. 2. No pneumoperitoneum. 3. Postoperative changes and support apparatus, as above.   Electronically Signed   By: Vinnie Langton M.D.   On: 09/01/2013 07:30   Dg Abd 1 View  08/30/2013   CLINICAL DATA:  Lower abdominal pain.  EXAM: ABDOMEN - 1 VIEW  COMPARISON:  Radiography from yesterday  FINDINGS: Mildly  dilated bowel in the left abdomen is again seen, currently 4 cm. Fluid levels are newly seen in the right abdomen. The patient is status post colectomy per prior CT imaging. IUD again noted. No pneumoperitoneum seen.  IMPRESSION: 1. Negative for pneumoperitoneum. 2. Dilated small bowel with fluid levels, suggesting ileus or obstruction.   Electronically Signed   By: Jorje Guild M.D.   On: 08/30/2013 03:21   Dg Abd 1 View  08/30/2013   CLINICAL DATA:  Nausea and vomiting. Suspect problems with colostomy back.  EXAM: ABDOMEN - 1 VIEW  COMPARISON:  05/19/2008 abdominal CT.  FINDINGS: Borderline dilated small bowel in the left abdomen (3.5 cm in maximal diameter). No abnormal intra-abdominal mass effect. No urolithiasis suspected. IUD noted. 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  IMPRESSION: Borderline dilated small bowel is central abdomen which could represent early/low grade obstruction or  mild ileus.   Electronically Signed   By: Jorje Guild M.D.   On: 08/30/2013 00:23   Dg Abd Portable 1v  08/30/2013   CLINICAL DATA:  NG tube placement  EXAM: PORTABLE ABDOMEN - 1 VIEW  COMPARISON:  08/30/2013  FINDINGS: NG tube enters stomach, loops in the fundus with the tip directed into the stomach body. Heart is enlarged. Mild distention of the small bowel in the mid abdomen. Postop changes of the lower lumbar spine.  IMPRESSION: NG tube within the stomach.  Similar small bowel distension.   Electronically Signed   By: Daryll Brod M.D.   On: 08/30/2013 07:16    Microbiology: Recent Results (from the past 240 hour(s))  URINE CULTURE     Status: None   Collection Time    08/30/13  9:01 AM      Result Value Range Status   Specimen Description URINE, CLEAN CATCH   Final   Special Requests NONE   Final   Culture  Setup Time     Final   Value: 08/30/2013 15:25     Performed at [facility]     Final   Value: >=100,000 COLONIES/ML     Performed at [facility]    Culture     Final   Value: Multiple bacterial morphotypes present, none predominant. Suggest appropriate recollection if clinically indicated.     Performed at [facility]   Report Status 08/31/2013 FINAL   Final     Labs: Basic Metabolic Panel:  Recent Labs Lab 08/30/13 0630 08/30/13 1830 08/31/13 0438 09/01/13 0537 09/02/13 0525 09/03/13 0445  NA 140 142 146* 149* 143 143  K 3.2* 3.4* 4.0 4.0 3.3* 3.7  CL 109 111 115* 119* 114* 111  CO2 21 21 19  16* 20 25  GLUCOSE 118* 98 81 70 93 88  BUN 19 23 28* 27* 21 15  CREATININE 1.57* 1.78* 2.28* 1.82* 1.48* 1.38*  CALCIUM 8.1* 8.2* 8.1* 8.0* 8.2* 7.7*  MG 1.4*  --  2.2 1.7  --   --    Liver Function Tests:  Recent Labs Lab 08/29/13 2333  AST 47*  ALT 32  ALKPHOS 101  BILITOT 1.1  PROT 8.3  ALBUMIN 3.0*    Recent Labs Lab 08/29/13 2312  LIPASE 42   CBC:  Recent Labs Lab 08/29/13 2333 08/30/13 0630 08/31/13 0438 09/01/13 0537  WBC 12.3* 11.8* 15.6* 12.3*  NEUTROABS 8.0*  --   --   --   HGB 15.2* 13.5 13.1 12.0  HCT 42.5 38.6 39.0 36.5  MCV 93.2 94.1 97.0 97.9  PLT 219 200 196 191    Signed:  Telicia Hodgkiss  [facility] 09/03/2013, 8:37 AM

## 2014-03-20 ENCOUNTER — Ambulatory Visit: Payer: Self-pay | Admitting: Obstetrics & Gynecology

## 2014-07-14 ENCOUNTER — Ambulatory Visit (INDEPENDENT_AMBULATORY_CARE_PROVIDER_SITE_OTHER): Payer: Medicare Other | Admitting: Obstetrics & Gynecology

## 2014-07-14 ENCOUNTER — Encounter: Payer: Self-pay | Admitting: Obstetrics & Gynecology

## 2014-07-14 VITALS — BP 163/98 | HR 102 | Temp 98.1°F | Wt 216.0 lb

## 2014-07-14 DIAGNOSIS — Z124 Encounter for screening for malignant neoplasm of cervix: Secondary | ICD-10-CM

## 2014-07-14 DIAGNOSIS — Z01419 Encounter for gynecological examination (general) (routine) without abnormal findings: Secondary | ICD-10-CM

## 2014-07-14 NOTE — Patient Instructions (Signed)

## 2014-07-14 NOTE — Progress Notes (Signed)
Subjective:     Jackie Parker is a 73 y.o. female here for a routine exam.     Personal health questionnaire:  Is patient [name], have a family history of breast and/or ovarian cancer: no Is there a family history of uterine cancer diagnosed at age < 36, gastrointestinal cancer, urinary tract cancer, family member who is a Field seismologist syndrome-associated carrier: no Is the patient overweight and hypertensive, family history of diabetes, personal history of gestational diabetes or PCOS: no Is patient over 68, have PCOS,  family history of premature CHD under age 4, diabetes, smoke, have hypertension or peripheral artery disease:  yes At any time, has a partner hit, kicked or otherwise hurt or frightened you?: no Over the past 2 weeks, have you felt down, depressed or hopeless?: no Over the past 2 weeks, have you felt little interest or pleasure in doing things?:no   Gynecologic History No LMP recorded. Patient is postmenopausal. Last Pap: 2014. Results were: normal Last mammogram: results were: normal  Obstetric History OB History  No data available    Past Medical History  Diagnosis Date  . Asthma   . Hypertension   . Bronchitis   . Colitis, ulcerative     Past Surgical History  Procedure Laterality Date  . Colostomy    . Ileostomy      Current outpatient prescriptions: amLODipine (NORVASC) 5 MG tablet, Take 5 mg by mouth daily., Disp: , Rfl: ;  bimatoprost (LUMIGAN) 0.03 % ophthalmic solution, Place 1 drop into both eyes 2 (two) times daily., Disp: , Rfl: ;  cetirizine (ZYRTEC) 10 MG tablet, Take 10 mg by mouth daily., Disp: , Rfl: ;  Cholecalciferol (VITAMIN D PO), Take 1 tablet by mouth daily., Disp: , Rfl:  Fluticasone-Salmeterol (ADVAIR) 250-50 MCG/DOSE AEPB, Inhale 1 puff into the lungs every 12 (twelve) hours., Disp: , Rfl: ;  montelukast (SINGULAIR) 10 MG tablet, Take 10 mg by mouth at bedtime., Disp: , Rfl: ;  phenol (CHLORASEPTIC) 1.4 % LIQD, Use as directed 1  spray in the mouth or throat every 4 (four) hours as needed for throat irritation / pain., Disp: , Rfl: 0 isosorbide-hydrALAZINE (BIDIL) 20-37.5 MG per tablet, Take 0.5 tablets by mouth 2 (two) times daily., Disp: 60 tablet, Rfl: 0 Allergies  Allergen Reactions  . Sulfonamide Derivatives     REACTION: rash    History  Substance Use Topics  . Smoking status: Never Smoker   . Smokeless tobacco: Not on file  . Alcohol Use: No    History reviewed. No pertinent family history.    Review of Systems  Constitutional: negative for fatigue and weight loss Respiratory: negative for cough and wheezing Cardiovascular: negative for chest pain, fatigue and palpitations Gastrointestinal: negative for abdominal pain and change in bowel habits Musculoskeletal:negative for myalgias Neurological: negative for gait problems and tremors Behavioral/Psych: negative for abusive relationship, depression Endocrine: negative for temperature intolerance   Genitourinary:negative for abnormal menstrual periods, genital lesions, hot flashes, sexual problems and vaginal discharge Integument/breast: negative for breast lump, breast tenderness, nipple discharge and skin lesion(s)    Objective:       BP 163/98 mmHg  Pulse 102  Temp(Src) 98.1 F (36.7 C)  Wt 97.977 kg (216 lb) General:   alert  Skin:   no rash or abnormalities  Lungs:   clear to auscultation bilaterally  Heart:   regular rate and rhythm, S1, S2 normal, no murmur, click, rub or gallop  Breasts:   normal without suspicious masses, skin  or nipple changes or axillary nodes  Abdomen:  normal findings: no organomegaly, soft, non-tender and no hernia  Pelvis:  External genitalia: normal general appearance Urinary system: urethral meatus normal and bladder without fullness, nontender Vaginal: normal without tenderness, induration or masses Cervix: normal appearance; IUD strings seen Adnexa: normal bimanual exam Uterus: anteverted and non-tender,  normal size   Lab Review  Labs reviewed no Radiologic studies reviewed no  .   Assessment:    Healthy female exam.    Plan:    Education reviewed: calcium supplements and weight bearing exercise.   Meds ordered this encounter  Medications  . amLODipine (NORVASC) 5 MG tablet    Sig: Take 5 mg by mouth daily.   Follow up as needed or in 6 mths

## 2014-09-08 ENCOUNTER — Encounter: Payer: Self-pay | Admitting: *Deleted

## 2014-09-09 ENCOUNTER — Encounter: Payer: Self-pay | Admitting: Obstetrics & Gynecology

## 2015-01-12 ENCOUNTER — Ambulatory Visit: Payer: Medicare Other | Admitting: Obstetrics & Gynecology

## 2015-06-29 ENCOUNTER — Other Ambulatory Visit: Payer: Self-pay | Admitting: Specialist

## 2015-06-29 DIAGNOSIS — M545 Low back pain: Secondary | ICD-10-CM

## 2015-07-06 ENCOUNTER — Ambulatory Visit
Admission: RE | Admit: 2015-07-06 | Discharge: 2015-07-06 | Disposition: A | Discharge status: Home / Self Care | Payer: Medicare Other | Source: Ambulatory Visit | Attending: Specialist | Admitting: Specialist

## 2015-07-06 DIAGNOSIS — M545 Low back pain: Secondary | ICD-10-CM

## 2015-07-06 MED ORDER — GADOBENATE DIMEGLUMINE 529 MG/ML IV SOLN
10.0000 mL | Freq: Once | INTRAVENOUS | Status: AC | PRN
  Administered 2015-07-06: 10 mL via INTRAVENOUS

## 2015-07-23 ENCOUNTER — Ambulatory Visit (INDEPENDENT_AMBULATORY_CARE_PROVIDER_SITE_OTHER): Payer: Medicare Other | Admitting: Certified Nurse Midwife

## 2015-07-23 ENCOUNTER — Encounter: Payer: Self-pay | Admitting: Certified Nurse Midwife

## 2015-07-23 VITALS — BP 132/82 | HR 85 | Temp 98.0°F | Ht 62.0 in | Wt 227.0 lb

## 2015-07-23 DIAGNOSIS — Z1231 Encounter for screening mammogram for malignant neoplasm of breast: Secondary | ICD-10-CM

## 2015-07-23 DIAGNOSIS — Z0000 Encounter for general adult medical examination without abnormal findings: Secondary | ICD-10-CM

## 2015-07-23 DIAGNOSIS — Z01419 Encounter for gynecological examination (general) (routine) without abnormal findings: Secondary | ICD-10-CM

## 2015-07-23 NOTE — Progress Notes (Signed)
Patient ID: Jackie Parker, female   DOB: 24-Jul-1941, 74 y.o.   MRN: [id number]    Subjective:      Jackie Parker is a 74 y.o. female here for a routine exam.  Current complaints: none.  Here for yearly exam.  Has IUD for postmenopausal bleeding.  Is happy with it, no problems with bleeding currently.    Not currently sexually active.  Declines blood testing/screening today.   Personal health questionnaire:  Is patient [name], have a family history of breast and/or ovarian cancer: no Is there a family history of uterine cancer diagnosed at age < 80, gastrointestinal cancer, urinary tract cancer, family member who is a Field seismologist syndrome-associated carrier: no Is the patient overweight and hypertensive, family history of diabetes, personal history of gestational diabetes, preeclampsia or PCOS: yes Is patient over 58, have PCOS,  family history of premature CHD under age 20, diabetes, smoke, have hypertension or peripheral artery disease:  yes At any time, has a partner hit, kicked or otherwise hurt or frightened you?: no Over the past 2 weeks, have you felt down, depressed or hopeless?: no Over the past 2 weeks, have you felt little interest or pleasure in doing things?:no   Gynecologic History No LMP recorded. Patient is postmenopausal. Contraception: IUD and post menopausal status Last Pap: 2014. Results were: normal Last mammogram: 06/2015 @ [facility]. Results were: normal according to the patient  Obstetric History OB History  No data available    Past Medical History  Diagnosis Date  . Asthma   . Hypertension   . Bronchitis   . Colitis, ulcerative ([place])     Past Surgical History  Procedure Laterality Date  . Colostomy    . Ileostomy       Current outpatient prescriptions:  .  amLODipine (NORVASC) 5 MG tablet, Take 5 mg by mouth daily., Disp: , Rfl:  .  bimatoprost (LUMIGAN) 0.03 % ophthalmic solution, Place 1 drop into both eyes 2 (two) times daily., Disp: ,  Rfl:  .  Cholecalciferol (VITAMIN D PO), Take 1 tablet by mouth daily., Disp: , Rfl:  .  Fluticasone-Salmeterol (ADVAIR) 250-50 MCG/DOSE AEPB, Inhale 1 puff into the lungs every 12 (twelve) hours., Disp: , Rfl:  .  montelukast (SINGULAIR) 10 MG tablet, Take 10 mg by mouth at bedtime., Disp: , Rfl:  .  traMADol (ULTRAM) 50 MG tablet, Take by mouth every 6 (six) hours as needed., Disp: , Rfl:  Allergies  Allergen Reactions  . Sulfonamide Derivatives     REACTION: rash    Social History  Substance Use Topics  . Smoking status: Never Smoker   . Smokeless tobacco: Not on file  . Alcohol Use: No    History reviewed. No pertinent family history.    Review of Systems  Constitutional: negative for fatigue and weight loss Respiratory: negative for cough and wheezing Cardiovascular: negative for chest pain, fatigue and palpitations Gastrointestinal: negative for abdominal pain and change in bowel habits Musculoskeletal:negative for myalgias Neurological: negative for gait problems and tremors Behavioral/Psych: negative for abusive relationship, depression Endocrine: negative for temperature intolerance   Genitourinary:negative for abnormal menstrual periods, genital lesions, hot flashes, sexual problems and vaginal discharge Integument/breast: negative for breast lump, breast tenderness, nipple discharge and skin lesion(s)    Objective:       BP 132/82 mmHg  Pulse 85  Temp(Src) 98 F (36.7 C)  Ht 5' 2"  (1.575 m)  Wt 227 lb (102.967 kg)  BMI 41.51 kg/m2 General:   alert  Skin:   no rash or abnormalities  Lungs:   clear to auscultation bilaterally  Heart:   regular rate and rhythm, S1, S2 normal, no murmur, click, rub or gallop  Breasts:   normal without suspicious masses, skin or nipple changes or axillary nodes  Abdomen:  normal findings: no organomegaly, soft, non-tender and no hernia  Pelvis:  External genitalia: normal general appearance Urinary system: urethral meatus  normal and bladder without fullness, nontender Vaginal: normal without tenderness, induration or masses Cervix: no CMT, strings present on exam Adnexa: normal bimanual exam Uterus: anteverted and non-tender, normal size   Lab Review Urine pregnancy test Labs reviewed yes Radiologic studies reviewed no  50% of 30 min visit spent on counseling and coordination of care.   Assessment:    Healthy female exam.   IUD present   H/O postmenopausal bleeding  Plan:    Education reviewed: calcium supplements, depression evaluation, low fat, low cholesterol diet, safe sex/STD prevention, self breast exams and weight bearing exercise. Contraception: IUD and post menopausal status. Follow up in: 1 year.   Meds ordered this encounter  Medications  . traMADol (ULTRAM) 50 MG tablet    Sig: Take by mouth every 6 (six) hours as needed.   No orders of the defined types were placed in this encounter.

## 2015-08-10 ENCOUNTER — Ambulatory Visit: Payer: Medicare Other | Attending: Specialist

## 2015-08-10 DIAGNOSIS — M2560 Stiffness of unspecified joint, not elsewhere classified: Secondary | ICD-10-CM | POA: Insufficient documentation

## 2015-08-10 DIAGNOSIS — R293 Abnormal posture: Secondary | ICD-10-CM | POA: Insufficient documentation

## 2015-08-10 DIAGNOSIS — M62830 Muscle spasm of back: Secondary | ICD-10-CM | POA: Insufficient documentation

## 2015-08-10 DIAGNOSIS — M545 Low back pain: Secondary | ICD-10-CM | POA: Insufficient documentation

## 2015-08-12 ENCOUNTER — Ambulatory Visit: Payer: Medicare Other | Admitting: Physical Therapy

## 2015-08-12 DIAGNOSIS — R293 Abnormal posture: Secondary | ICD-10-CM | POA: Diagnosis present

## 2015-08-12 DIAGNOSIS — M2560 Stiffness of unspecified joint, not elsewhere classified: Secondary | ICD-10-CM | POA: Diagnosis present

## 2015-08-12 DIAGNOSIS — M62830 Muscle spasm of back: Secondary | ICD-10-CM | POA: Diagnosis present

## 2015-08-12 DIAGNOSIS — M5386 Other specified dorsopathies, lumbar region: Secondary | ICD-10-CM

## 2015-08-12 DIAGNOSIS — M545 Low back pain, unspecified: Secondary | ICD-10-CM

## 2015-08-12 NOTE — Patient Instructions (Signed)
   Vernal Rutan PT, DPT, LAT, ATC  [facility] Phone: [phone]     

## 2015-08-12 NOTE — Therapy (Signed)
[place], [place], [address] Phone: [phone]   Fax:  [phone]  Physical Therapy Evaluation  Patient Details  Name: Jackie Parker MRN: [id number] Date of Birth: 08-May-1941 Referring Provider: Basil Dess MD  Encounter Date: 08/12/2015      PT End of Session - 08/12/15 1404    Visit Number 1   Number of Visits 12   Date for PT Re-Evaluation 09/23/15   PT Start Time 1330   PT Stop Time 1415   PT Time Calculation (min) 45 min   Activity Tolerance Patient tolerated treatment well;Patient limited by pain      Past Medical History  Diagnosis Date  . Asthma   . Hypertension   . Bronchitis   . Colitis, ulcerative ([place])     Past Surgical History  Procedure Laterality Date  . Colostomy    . Ileostomy      There were no vitals filed for this visit.  Visit Diagnosis:  Bilateral low back pain without sciatica - Plan: PT plan of care cert/re-cert  Decreased ROM of lumbar spine - Plan: PT plan of care cert/re-cert  Abnormal posture - Plan: PT plan of care cert/re-cert  Back muscle spasm - Plan: PT plan of care cert/re-cert      Subjective Assessment - 08/12/15 1336    Subjective pt is a 74 y.o F with CC of low back pain and hx of spinals stenosis for the last 7-8 years.  She reports the symptoms have gottenworse since it started. she denies any referral of symptoms down the legs, and pain stays in the back .   Limitations Standing;Lifting;House hold activities   How long can you sit comfortably? unlimited   How long can you stand comfortably? 5-10 min   How long can you walk comfortably? 5-10 min   Diagnostic tests MRI 07/06/2015 stenosis and degerenative changes   Patient Stated Goals to get the back better.    Currently in Pain? Yes   Pain Score 8    Pain Location Back   Pain Orientation Right;Mid;Left;Lower   Pain Descriptors / Indicators Aching;Sharp   Pain Type Chronic pain   Pain Radiating Towards N/A   Pain Onset More than a month ago   Pain Frequency Constant   Aggravating Factors  prlonged standing/ walking   Pain Relieving Factors sitting, resting, heating pad            [facility] PT Assessment - 08/12/15 1323    Assessment   Medical Diagnosis multi-level lumbar stenosis   Referring Provider Basil Dess MD   Onset Date/Surgical Date --  oer 8 years   Hand Dominance Right   Next MD Visit --  2 months   Prior Therapy yes   Precautions   Precaution Comments no lifting over 5 #   Restrictions   Weight Bearing Restrictions No   Balance Screen   Has the patient fallen in the past 6 months No   Has the patient had a decrease in activity level because of a fear of falling?  No   Is the patient reluctant to leave their home because of a fear of falling?  No   Home Environment   Living Environment Private residence   Living Arrangements Children   Available Help at Discharge Available 24 hours/day;Available PRN/intermittently   Type of Home House   Home Access Stairs to enter   Entrance Stairs-Number of Steps 3   Entrance Stairs-Rails Right   Home  Layout One level   [place] - single point  rollator   Prior Function   Level of Independence Independent;Independent with basic ADLs   Vocation On disability   Leisure traveling, walking/ in water   Cognition   Overall Cognitive Status Within Functional Limits for tasks assessed   Observation/Other Assessments   Observations prevous surgical incisions in the lumbar spine   Focus on Therapeutic Outcomes (FOTO)  60% limited  50% limited   Posture/Postural Control   Posture/Postural Control Postural limitations   Postural Limitations Rounded Shoulders;Forward head;Decreased lumbar lordosis   ROM / Strength   AROM / PROM / Strength AROM;Strength   AROM   AROM Assessment Site Lumbar   Lumbar Flexion 50  tightness at end range   Lumbar Extension 10   Lumbar - Right Side Bend 25   Lumbar  - Left Side Bend 32   Lumbar - Right Rotation 25%   Lumbar - Left Rotation 25%   Strength   Strength Assessment Site Hip;Knee   Right/Left Hip Right;Left   Right Hip Flexion 4+/5   Right Hip ABduction 4+/5   Right Hip ADduction 4+/5   Left Hip Flexion 4+/5   Left Hip ABduction 4+/5   Left Hip ADduction 4+/5   Right/Left Knee Right;Left   Right Knee Flexion 4+/5   Right Knee Extension 4+/5   Left Knee Flexion 4+/5   Palpation   Spinal mobility hypomobility of passive lumbar movement L1-L5   Palpation comment tenderness in bil paraspinals, and along L1-L5 vertebrae   Special Tests    Special Tests Lumbar   Lumbar Tests Slump Test;Prone Knee Bend Test;Straight Leg Raise   Slump test   Findings Negative   Prone Knee Bend Test   Findings Unable to test   Straight Leg Raise   Findings Negative   Ambulation/Gait   Gait Pattern Step-through pattern;Decreased stride length;Antalgic                           PT Education - 08/12/15 1403    Education provided Yes   Education Details evaluation findings, POC, goals, HEP   Person(s) Educated Patient   Methods Explanation   Comprehension Verbalized understanding          PT Short Term Goals - 08/12/15 1408    PT SHORT TERM GOAL #1   Title pt will be I with inital HEP (09/02/2015)   Time 3   Period Weeks   Status New   PT SHORT TERM GOAL #2   Title pt will be able to verbalize and demonstrate techinques to decrased low back pain via postural awareness, lifting and carrying mechanics, and HEP (09/02/2015)   Time 3   Period Weeks   Status New   PT SHORT TERM GOAL #3   Title pt will demonstrate </= 5/10 pain with standing/ walking for >/= 10 minutes to promote functional endurance (09/02/2015)   Time 6   Period Weeks   Status New           PT Long Term Goals - 08/12/15 1410    PT LONG TERM GOAL #1   Title pt will be I with all HEP at discharge (09/23/2015)   Time 6   Period Weeks   Status New    PT LONG TERM GOAL #2   Title pt will demonstrate improved flexion and side bending AROM by >/= 10 degrees to assist with ADLs with </= 2/10 pain (  09/23/2015)   Time 6   Period Weeks   Status New   PT LONG TERM GOAL #3   Title pt will be able to stand/ walk >/= 15 minutes with </=2/10 pain to assist with prlonged standing and walking endurance for ADLs (09/23/2015)   Time 6   Period Weeks   Status New   PT LONG TERM GOAL #4   Title pt will increase her FOTO score to >/= 50 to demonstrate improvement in function at discharge (09/23/2015)   Time [place] - 08/31/2015 1404    Clinical Impression Statement Jackie Parker reports to OPPT with CC of low back pain that has been present for the last 8 years. she has a hx of 2 lumbar surgeries in 2000/2005. she demonstrates limited trunk mobility due to pain and tightness in all planes. MMT revealed strength WNL.  palaption revealed tighntess of bil lumbar paraspinals and hypomobility of the intervertebral movements of the lumbar spine. She would benefit from physical therapy to decrease pain and maxmize function by addresing the impairments listed.    Pt will benefit from skilled therapeutic intervention in order to improve on the following deficits Pain;Improper body mechanics;Postural dysfunction;Increased muscle spasms;Decreased range of motion;Decreased endurance;Decreased activity tolerance;Hypomobility   Rehab Potential Good   Clinical Impairments Affecting Rehab Potential ostomy cant lay on stomach   PT Frequency 2x / week   PT Duration 6 weeks   PT Treatment/Interventions ADLs/Self Care Home Management;Cryotherapy;Electrical Stimulation;Iontophoresis 57m/ml Dexamethasone;Moist Heat;Therapeutic exercise;Therapeutic activities;Vasopneumatic Device;Taping;Dry needling;Passive range of motion;Patient/family education;Ultrasound   PT Next Visit Plan assess response to HEP, Williams flexion exercises, core  strengthening, modalities PRN for pain   PT Home Exercise Plan see HEP handout   Consulted and Agree with Plan of Care Patient          G-Codes - 112/19/20161413    Functional Limitation Mobility: Walking and moving around   Mobility: Walking and Moving Around Current Status [phone] At least 60 percent but less than 80 percent impaired, limited or restricted   Mobility: Walking and Moving Around Goal Status [phone] At least 40 percent but less than 60 percent impaired, limited or restricted       Problem List Patient Active Problem List   Diagnosis Date Noted  . Small bowel obstruction, partial ([place] 08/30/2013  . [place] renal failure ([place] 08/30/2013  . 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 N&V (nausea and vomiting) 08/30/2013  . Abdominal pain, acute 08/30/2013  . SBO (small bowel obstruction) ([place] 08/30/2013  . Hypokalemia 08/30/2013  . Partial small bowel obstruction ([place] 08/30/2013  . Postmenopausal bleeding 03/18/2013  . OSTEOARTHRITIS, KNEE, LEFT 03/20/2010  . TRICUSPID REGURGITATION, MILD 03/03/2010  . RENAL INSUFFICIENCY 03/02/2010  . MORBID OBESITY 02/18/2010  . PREMATURE ATRIAL CONTRACTIONS 02/18/2010  . PREMATURE VENTRICULAR CONTRACTIONS 02/18/2010  . CARDIAC MURMUR 02/18/2010  . EDEMA 10/20/2009  . HYPOKALEMIA 07/30/2009  . RIB PAIN, RIGHT SIDED 05/26/2008  . ABDOMINAL PAIN, LEFT LOWER QUADRANT 12/31/2007  . ALLERGIC RHINITIS 06/25/2007  . ABNORMAL BLOOD CHEMISTRY , OTHER 06/25/2007  . UNSPECIFIED VITAMIN D DEFICIENCY 04/30/2007  . HYPOCALCEMIA 04/19/2007  . GLAUCOMA NEC 04/19/2007  . HYPERTENSION, ESSENTIAL NOS 04/19/2007  . ASTHMA 04/19/2007  . COLITIS, ULCERATIVE NOS 04/19/2007  . DISORDER, MENSTRUAL NEC 04/19/2007  . PSORIASIS 04/19/2007  . 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 GOUT NOS 02/21/2006  Starr [place] PT, DPT, LAT, ATC  08/12/2015  2:16 PM     [facility] [address] [place], [place], [address] Phone: [phone]   Fax:  [phone]  Name: Jackie Parker MRN: [id number] Date of Birth: 10-13-1940

## 2015-08-18 ENCOUNTER — Ambulatory Visit: Payer: Medicare Other | Attending: Specialist | Admitting: Physical Therapy

## 2015-08-18 DIAGNOSIS — R293 Abnormal posture: Secondary | ICD-10-CM | POA: Insufficient documentation

## 2015-08-18 DIAGNOSIS — M545 Low back pain, unspecified: Secondary | ICD-10-CM

## 2015-08-18 DIAGNOSIS — M5386 Other specified dorsopathies, lumbar region: Secondary | ICD-10-CM

## 2015-08-18 DIAGNOSIS — M2560 Stiffness of unspecified joint, not elsewhere classified: Secondary | ICD-10-CM | POA: Insufficient documentation

## 2015-08-18 DIAGNOSIS — M62830 Muscle spasm of back: Secondary | ICD-10-CM | POA: Diagnosis present

## 2015-08-19 NOTE — Therapy (Signed)
[place], [place], [address] Phone: [phone]   Fax:  [phone]  Physical Therapy Treatment  Patient Details  Name: Jackie Parker MRN: [id number] Date of Birth: 03-01-1941 Referring Briceida Rasberry: Basil Dess MD  Encounter Date: 08/18/2015      PT End of Session - 08/18/15 1514    Visit Number 2   Number of Visits 12   Date for PT Re-Evaluation 09/23/15   PT Start Time 1500   PT Stop Time 1600   PT Time Calculation (min) 60 min      Past Medical History  Diagnosis Date  . Asthma   . Hypertension   . Bronchitis   . Colitis, ulcerative ([place])     Past Surgical History  Procedure Laterality Date  . Colostomy    . Ileostomy      There were no vitals filed for this visit.  Visit Diagnosis:  Bilateral low back pain without sciatica  Decreased ROM of lumbar spine  Abnormal posture  Back muscle spasm      Subjective Assessment - 08/18/15 1503    Subjective Patient presents with 5/10 pain thanks to taking tromodol this morning.    Currently in Pain? Yes   Pain Score 5    Pain Location Back   Pain Orientation Lower   Pain Descriptors / Indicators Aching   Pain Type Chronic pain   Pain Onset More than a month ago   Pain Frequency Constant   Aggravating Factors  prolonged standing/ walking   Pain Relieving Factors sitting, resting, heating pad                         [facility] Adult PT Treatment/Exercise - 08/18/15 1548    Lumbar Exercises: Aerobic   Stationary Bike NUSTEP L3 8 minutes   Lumbar Exercises: Seated   Other Seated Lumbar Exercises Pelvic tilts 5 second holds 10 reps   Lumbar Exercises: Supine   Ab Set 20 reps;5 seconds   AB Set Limitations --  with posterior pelvic tilt   Clam 15 reps   Clam Limitations yellow band   Other Supine Lumbar Exercises ball squeezes x 15; double knee to chest stretch x 10 reps 5 sec holds; marching with ab set x 20   Moist  Heat Therapy   Number Minutes Moist Heat 15 Minutes   Moist Heat Location Lumbar Spine   Electrical Stimulation   Electrical Stimulation Location paraspinals   Electrical Stimulation Action IFC   Electrical Stimulation Parameters to tol; 15 min   Electrical Stimulation Goals Pain                  PT Short Term Goals - 08/12/15 1408    PT SHORT TERM GOAL #1   Title pt will be I with inital HEP (09/02/2015)   Time 3   Period Weeks   Status New   PT SHORT TERM GOAL #2   Title pt will be able to verbalize and demonstrate techinques to decrased low back pain via postural awareness, lifting and carrying mechanics, and HEP (09/02/2015)   Time 3   Period Weeks   Status New   PT SHORT TERM GOAL #3   Title pt will demonstrate </= 5/10 pain with standing/ walking for >/= 10 minutes to promote functional endurance (09/02/2015)   Time 6   Period Weeks   Status New           PT  Long Term Goals - 08/12/15 1410    PT LONG TERM GOAL #1   Title pt will be I with all HEP at discharge (09/23/2015)   Time 6   Period Weeks   Status New   PT LONG TERM GOAL #2   Title pt will demonstrate improved flexion and side bending AROM by >/= 10 degrees to assist with ADLs with </= 2/10 pain (09/23/2015)   Time 6   Period Weeks   Status New   PT LONG TERM GOAL #3   Title pt will be able to stand/ walk >/= 15 minutes with </=2/10 pain to assist with prlonged standing and walking endurance for ADLs (09/23/2015)   Time 6   Period Weeks   Status New   PT LONG TERM GOAL #4   Title pt will increase her FOTO score to >/= 50 to demonstrate improvement in function at discharge (09/23/2015)   Time 6   Period Weeks   Status New               Plan - 08/18/15 1514    Clinical Impression Statement Pt. experiencing SOB with williams flexion exercises, instructed to do pursed lip breathing and changed position to sitting and SOB went away. Reviewed HEP and progressed core stabilization  exercises. Exercises did not increase pain throughout.   PT Next Visit Plan core strengthening, modalities PRN for pain        Problem List Patient Active Problem List   Diagnosis Date Noted  . Small bowel obstruction, partial ([place]) 08/30/2013  . Acute renal failure ([place]) 08/30/2013  . 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 N&V (nausea and vomiting) 08/30/2013  . Abdominal pain, acute 08/30/2013  . SBO (small bowel obstruction) ([place]) 08/30/2013  . Hypokalemia 08/30/2013  . Partial small bowel obstruction ([place]) 08/30/2013  . Postmenopausal bleeding 03/18/2013  . OSTEOARTHRITIS, KNEE, LEFT 03/20/2010  . TRICUSPID REGURGITATION, MILD 03/03/2010  . RENAL INSUFFICIENCY 03/02/2010  . MORBID OBESITY 02/18/2010  . PREMATURE ATRIAL CONTRACTIONS 02/18/2010  . PREMATURE VENTRICULAR CONTRACTIONS 02/18/2010  . CARDIAC MURMUR 02/18/2010  . EDEMA 10/20/2009  . HYPOKALEMIA 07/30/2009  . RIB PAIN, RIGHT SIDED 05/26/2008  . ABDOMINAL PAIN, LEFT LOWER QUADRANT 12/31/2007  . ALLERGIC RHINITIS 06/25/2007  . ABNORMAL BLOOD CHEMISTRY , OTHER 06/25/2007  . UNSPECIFIED VITAMIN D DEFICIENCY 04/30/2007  . HYPOCALCEMIA 04/19/2007  . GLAUCOMA NEC 04/19/2007  . HYPERTENSION, ESSENTIAL NOS 04/19/2007  . ASTHMA 04/19/2007  . COLITIS, ULCERATIVE NOS 04/19/2007  . DISORDER, MENSTRUAL NEC 04/19/2007  . PSORIASIS 04/19/2007  . 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 GOUT NOS 02/21/2006   Laury Axon, SPTA 08/19/2015 9:56 AM [phone] [facility] [place] [place], [place], [address] Phone: [phone]   Fax:  [phone]  Name: Shera Laubach MRN: [id number] Date of Birth: 06/15/1941

## 2015-08-20 ENCOUNTER — Ambulatory Visit: Payer: Medicare Other | Admitting: Physical Therapy

## 2015-08-20 DIAGNOSIS — R293 Abnormal posture: Secondary | ICD-10-CM

## 2015-08-20 DIAGNOSIS — M545 Low back pain, unspecified: Secondary | ICD-10-CM

## 2015-08-20 DIAGNOSIS — M5386 Other specified dorsopathies, lumbar region: Secondary | ICD-10-CM

## 2015-08-20 DIAGNOSIS — M62830 Muscle spasm of back: Secondary | ICD-10-CM

## 2015-08-20 NOTE — Patient Instructions (Signed)
Hip Flexor Stretch    Lying on back near edge of bed, bend one leg, foot flat. Hang other leg over edge, relaxed, thigh resting entirely on bed for _1___ minutes. Repeat ___2_ times. Do _2___ sessions per day. Advanced Exercise: Bend knee back keeping thigh in contact with bed.  http://gt2.exer.us/347   Copyright  VHI. All rights reserved.

## 2015-08-20 NOTE — Therapy (Signed)
[place], [place], [address] Phone: [phone]   Fax:  [phone]  Physical Therapy Treatment  Patient Details  Name: Deeksha Cotrell MRN: [id number] Date of Birth: 1940/12/05 Referring Provider: Basil Dess MD  Encounter Date: 08/20/2015      PT End of Session - 08/20/15 1456    Visit Number 3   Number of Visits 12   Date for PT Re-Evaluation 09/23/15   PT Start Time 0255   PT Stop Time 0335   PT Time Calculation (min) 40 min      Past Medical History  Diagnosis Date  . Asthma   . Hypertension   . Bronchitis   . Colitis, ulcerative ([place])     Past Surgical History  Procedure Laterality Date  . Colostomy    . Ileostomy      There were no vitals filed for this visit.  Visit Diagnosis:  Bilateral low back pain without sciatica  Decreased ROM of lumbar spine  Abnormal posture  Back muscle spasm      Subjective Assessment - 08/20/15 1500    Subjective My knees hurt so bad after last time I could hardly walk. My back has not hurt today or when I did my exercises.    Currently in Pain? No/denies   Pain Score 1    Pain Location Back                         [facility] Adult PT Treatment/Exercise - 08/20/15 1457    Lumbar Exercises: Stretches   Passive Hamstring Stretch 3 reps;30 seconds   Single Knee to Chest Stretch 3 reps;30 seconds   Lumbar Exercises: Aerobic   Stationary Bike Nustep L3 x 5 min   Lumbar Exercises: Supine   Ab Set 10 reps   Bent Knee Raise 20 reps   Bridge 10 reps   Straight Leg Raise 10 reps   Knee/Hip Exercises: Stretches   Hip Flexor Stretch 60 seconds   Hip Flexor Stretch Limitations bilateral                PT Education - 08/20/15 1512    Education provided Yes   Education Details Hip flexor stretch   Person(s) Educated Patient   Methods Explanation;Handout   Comprehension Verbalized understanding          PT Short Term  Goals - 08/20/15 1512    PT SHORT TERM GOAL #1   Title pt will be I with inital HEP (09/02/2015)   Time 3   Period Weeks   Status On-going   PT SHORT TERM GOAL #2   Title pt will be able to verbalize and demonstrate techinques to decrased low back pain via postural awareness, lifting and carrying mechanics, and HEP (09/02/2015)   Time 3   Period Weeks   Status On-going   PT SHORT TERM GOAL #3   Title pt will demonstrate </= 5/10 pain with standing/ walking for >/= 10 minutes to promote functional endurance (09/02/2015)   Time 6   Period Weeks   Status On-going           PT Long Term Goals - 08/12/15 1410    PT LONG TERM GOAL #1   Title pt will be I with all HEP at discharge (09/23/2015)   Time 6   Period Weeks   Status New   PT LONG TERM GOAL #2   Title pt will demonstrate improved flexion  and side bending AROM by >/= 10 degrees to assist with ADLs with </= 2/10 pain (09/23/2015)   Time 6   Period Weeks   Status New   PT LONG TERM GOAL #3   Title pt will be able to stand/ walk >/= 15 minutes with </=2/10 pain to assist with prlonged standing and walking endurance for ADLs (09/23/2015)   Time 6   Period Weeks   Status New   PT LONG TERM GOAL #4   Title pt will increase her FOTO score to >/= 50 to demonstrate improvement in function at discharge (09/23/2015)   Time 6   Period Weeks   Status New               Plan - 08/20/15 1533    Clinical Impression Statement Pt reports no back pain today. Estim was very helpful. Single knee to chest better due to SOB. Pt limited by knee pain and reports knee replacemets may be in the future. Hip flexors tight upon testing so instructed pt in hip flexor stretch at edge of  mat and added to HEP.    PT Next Visit Plan core strengthening, modalities PRN for pain        Problem List Patient Active Problem List   Diagnosis Date Noted  . Small bowel obstruction, partial ([place]) 08/30/2013  . Acute renal failure ([place]) 08/30/2013   . 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 N&V (nausea and vomiting) 08/30/2013  . Abdominal pain, acute 08/30/2013  . SBO (small bowel obstruction) ([place]) 08/30/2013  . Hypokalemia 08/30/2013  . Partial small bowel obstruction ([place]) 08/30/2013  . Postmenopausal bleeding 03/18/2013  . OSTEOARTHRITIS, KNEE, LEFT 03/20/2010  . TRICUSPID REGURGITATION, MILD 03/03/2010  . RENAL INSUFFICIENCY 03/02/2010  . MORBID OBESITY 02/18/2010  . PREMATURE ATRIAL CONTRACTIONS 02/18/2010  . PREMATURE VENTRICULAR CONTRACTIONS 02/18/2010  . CARDIAC MURMUR 02/18/2010  . EDEMA 10/20/2009  . HYPOKALEMIA 07/30/2009  . RIB PAIN, RIGHT SIDED 05/26/2008  . ABDOMINAL PAIN, LEFT LOWER QUADRANT 12/31/2007  . ALLERGIC RHINITIS 06/25/2007  . ABNORMAL BLOOD CHEMISTRY , OTHER 06/25/2007  . UNSPECIFIED VITAMIN D DEFICIENCY 04/30/2007  . HYPOCALCEMIA 04/19/2007  . GLAUCOMA NEC 04/19/2007  . HYPERTENSION, ESSENTIAL NOS 04/19/2007  . ASTHMA 04/19/2007  . COLITIS, ULCERATIVE NOS 04/19/2007  . DISORDER, MENSTRUAL NEC 04/19/2007  . PSORIASIS 04/19/2007  . 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 GOUT NOS 02/21/2006    Dorene Ar, PTA 08/20/2015, 5:23 PM  [facility] [address] [place], [place], [address] Phone: [phone]   Fax:  [phone]  Name: Meaghann Choo MRN: [id number] Date of Birth: 1941-07-24

## 2015-08-24 ENCOUNTER — Ambulatory Visit: Payer: Medicare Other | Admitting: Physical Therapy

## 2015-08-24 DIAGNOSIS — M545 Low back pain, unspecified: Secondary | ICD-10-CM

## 2015-08-24 DIAGNOSIS — M5386 Other specified dorsopathies, lumbar region: Secondary | ICD-10-CM

## 2015-08-24 DIAGNOSIS — R293 Abnormal posture: Secondary | ICD-10-CM

## 2015-08-24 DIAGNOSIS — M62830 Muscle spasm of back: Secondary | ICD-10-CM

## 2015-08-24 NOTE — Therapy (Signed)
[place], [place], [address] Phone: [phone]   Fax:  [phone]  Physical Therapy Treatment  Patient Details  Name: Jackie Parker MRN: [id number] Date of Birth: [birth year] Referring Provider: Basil Dess MD  Encounter Date: 08/24/2015  UNCHARGED VISIT: Time In: 210    Time out: 215  Past Medical History  Diagnosis Date  . Asthma   . Hypertension   . Bronchitis   . Colitis, ulcerative ([place])     Past Surgical History  Procedure Laterality Date  . Colostomy    . Ileostomy      There were no vitals filed for this visit.  Visit Diagnosis:  Bilateral low back pain without sciatica  Decreased ROM of lumbar spine  Abnormal posture  Back muscle spasm      Subjective Assessment - 08/24/15 1418    Subjective I am nauseous. My vertigo is kicking in.    Currently in Pain? Other (Comment)  Did not assess            PT Short Term Goals - 08/20/15 1512    PT SHORT TERM GOAL #1   Title pt will be I with inital HEP (09/02/2015)   Time 3   Period Weeks   Status On-going   PT SHORT TERM GOAL #2   Title pt will be able to verbalize and demonstrate techinques to decrased low back pain via postural awareness, lifting and carrying mechanics, and HEP (09/02/2015)   Time 3   Period Weeks   Status On-going   PT SHORT TERM GOAL #3   Title pt will demonstrate </= 5/10 pain with standing/ walking for >/= 10 minutes to promote functional endurance (09/02/2015)   Time 6   Period Weeks   Status On-going           PT Long Term Goals - 08/12/15 1410    PT LONG TERM GOAL #1   Title pt will be I with all HEP at discharge (09/23/2015)   Time 6   Period Weeks   Status New   PT LONG TERM GOAL #2   Title pt will demonstrate improved flexion and side bending AROM by >/= 10 degrees to assist with ADLs with </= 2/10 pain (09/23/2015)   Time 6   Period Weeks   Status New   PT LONG TERM GOAL #3    Title pt will be able to stand/ walk >/= 15 minutes with </=2/10 pain to assist with prlonged standing and walking endurance for ADLs (09/23/2015)   Time 6   Period Weeks   Status New   PT LONG TERM GOAL #4   Title pt will increase her FOTO score to >/= 50 to demonstrate improvement in function at discharge (09/23/2015)   Time 6   Period Weeks   Status New               Plan - 08/24/15 1419    Clinical Impression Statement Pt arrived with nausea and vertigo. Recommended the patient reschedule. No charge this visit.    PT Next Visit Plan core strengthening, modalities PRN for pain        Problem List Patient Active Problem List   Diagnosis Date Noted  . Small bowel obstruction, partial ([place]) 08/30/2013  . Acute renal failure ([place]) 08/30/2013  . 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 N&V (nausea and vomiting) 08/30/2013  . Abdominal pain, acute 08/30/2013  . SBO (small bowel obstruction) ([place]) 08/30/2013  .  Hypokalemia 08/30/2013  . Partial small bowel obstruction ([place]) 08/30/2013  . Postmenopausal bleeding 03/18/2013  . OSTEOARTHRITIS, KNEE, LEFT 03/20/2010  . TRICUSPID REGURGITATION, MILD 03/03/2010  . RENAL INSUFFICIENCY 03/02/2010  . MORBID OBESITY 02/18/2010  . PREMATURE ATRIAL CONTRACTIONS 02/18/2010  . PREMATURE VENTRICULAR CONTRACTIONS 02/18/2010  . CARDIAC MURMUR 02/18/2010  . EDEMA 10/20/2009  . HYPOKALEMIA 07/30/2009  . RIB PAIN, RIGHT SIDED 05/26/2008  . ABDOMINAL PAIN, LEFT LOWER QUADRANT 12/31/2007  . ALLERGIC RHINITIS 06/25/2007  . ABNORMAL BLOOD CHEMISTRY , OTHER 06/25/2007  . UNSPECIFIED VITAMIN D DEFICIENCY 04/30/2007  . HYPOCALCEMIA 04/19/2007  . GLAUCOMA NEC 04/19/2007  . HYPERTENSION, ESSENTIAL NOS 04/19/2007  . ASTHMA 04/19/2007  . COLITIS, ULCERATIVE NOS 04/19/2007  . DISORDER, MENSTRUAL NEC 04/19/2007  . PSORIASIS 04/19/2007  . 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 GOUT NOS 02/21/2006    Dorene Ar,  PTA 08/24/2015, 2:21 PM  [facility] [address] [place], [place], [address] Phone: [phone]   Fax:  [phone]  Name: Jackie Parker MRN: [id number] Date of Birth: 02-Sep-1941

## 2015-08-25 ENCOUNTER — Encounter: Payer: Self-pay | Admitting: Physical Therapy

## 2015-08-26 ENCOUNTER — Ambulatory Visit: Payer: Medicare Other | Admitting: Physical Therapy

## 2015-08-26 DIAGNOSIS — M545 Low back pain, unspecified: Secondary | ICD-10-CM

## 2015-08-26 DIAGNOSIS — M62830 Muscle spasm of back: Secondary | ICD-10-CM

## 2015-08-26 DIAGNOSIS — M5386 Other specified dorsopathies, lumbar region: Secondary | ICD-10-CM

## 2015-08-26 DIAGNOSIS — R293 Abnormal posture: Secondary | ICD-10-CM

## 2015-08-26 NOTE — Patient Instructions (Signed)
Backward Bend (Standing)   Arch backward to make hollow of back deeper. Hold _5___ seconds. Repeat __10__ times per set. Do ___2   sets per session. Do __2__ sessions per day.  ABDUCTION: Standing (Active)   Stand, feet flat. Lift right leg out to side. Use _0__ lbs. Complete __10_ repetitions. 2 sets each side. Perform __2_ sessions per day.  EXTENSION: Standing (Active)  Stand, both feet flat. Draw right leg behind body as far as possible. Use 0___ lbs. Complete 10 repetitions. 2 sets .  Perform __2_ sessions per day.

## 2015-08-26 NOTE — Therapy (Signed)
[place], [place], [address] Phone: [phone]   Fax:  [phone]  Physical Therapy Treatment  Patient Details  Name: Jackie Parker MRN: [id number] Date of Birth: Jun 20, 1941 Referring Provider: Basil Dess MD  Encounter Date: 08/26/2015      PT End of Session - 08/26/15 1418    Visit Number 4   Number of Visits 12   Date for PT Re-Evaluation 09/23/15   PT Start Time 0217   PT Stop Time 0300   PT Time Calculation (min) 43 min      Past Medical History  Diagnosis Date  . Asthma   . Hypertension   . Bronchitis   . Colitis, ulcerative ([place])     Past Surgical History  Procedure Laterality Date  . Colostomy    . Ileostomy      There were no vitals filed for this visit.  Visit Diagnosis:  Bilateral low back pain without sciatica  Decreased ROM of lumbar spine  Abnormal posture  Back muscle spasm      Subjective Assessment - 08/26/15 1418    Subjective The back is feeling pretty good. Low back hurts if I stand more than 10 minutes.    How long can you stand comfortably? 10 min   Diagnostic tests MRI 07/06/2015 stenosis and degerenative changes   Currently in Pain? No/denies   Aggravating Factors  standing    Pain Relieving Factors sitting                         [facility] Adult PT Treatment/Exercise - 08/26/15 0001    Lumbar Exercises: Stretches   Passive Hamstring Stretch --   Single Knee to Chest Stretch 3 reps;30 seconds   Standing Extension 5 reps   Standing Extension Limitations 2 sets of 5 for 5 sec holds   Lumbar Exercises: Standing   Other Standing Lumbar Exercises alternating hip extension, then abduction 10 each- cues for posture and ab bracing, one set of extension done with opposite arm lift as a modified quadruped exercise- pt is unable to tolerate quadruped and cannot lie on 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    Lumbar Exercises: Supine   Bent Knee Raise 20 reps    Bent Knee Raise Limitations Scissors level 2, 3 with good control   Bridge 20 reps   Bridge Limitations feet over ball   Lumbar Exercises: Quadruped   Single Arm Raise 5 reps   Single Arm Raises Limitations unable to tolerate quadruped   Knee/Hip Exercises: Stretches   Hip Flexor Stretch 2 reps;60 seconds   Hip Flexor Stretch Limitations bilateral                PT Education - 08/26/15 1458    Education provided Yes   Education Details Lumbar extension and standing hip extension and hip abduction   Person(s) Educated Patient   Methods Explanation;Handout   Comprehension Verbalized understanding          PT Short Term Goals - 08/20/15 1512    PT SHORT TERM GOAL #1   Title pt will be I with inital HEP (09/02/2015)   Time 3   Period Weeks   Status On-going   PT SHORT TERM GOAL #2   Title pt will be able to verbalize and demonstrate techinques to decrased low back pain via postural awareness, lifting and carrying mechanics, and HEP (09/02/2015)   Time 3   Period Weeks   Status  On-going   PT SHORT TERM GOAL #3   Title pt will demonstrate </= 5/10 pain with standing/ walking for >/= 10 minutes to promote functional endurance (09/02/2015)   Time 6   Period Weeks   Status On-going           PT Long Term Goals - 08/26/15 1514    PT LONG TERM GOAL #1   Title pt will be I with all HEP at discharge (09/23/2015)   Time 6   Period Weeks   Status On-going   PT LONG TERM GOAL #2   Title pt will demonstrate improved flexion and side bending AROM by >/= 10 degrees to assist with ADLs with </= 2/10 pain (09/23/2015)   Time 6   Period Weeks   Status Unable to assess   PT LONG TERM GOAL #3   Title pt will be able to stand/ walk >/= 15 minutes with </=2/10 pain to assist with prlonged standing and walking endurance for ADLs (09/23/2015)   Time 6   Period Weeks   Status On-going   PT LONG TERM GOAL #4   Title pt will increase her FOTO score to >/= 50 to demonstrate  improvement in function at discharge (09/23/2015)   Time 6   Period Weeks   Status On-going               Plan - 08/26/15 1508    Clinical Impression Statement No vertigo today. Instructed pt is moderate abdominal/core exercises supine. [place] exercises cause hamstring cramps. Unable to tolerate quadruped or prone so moved to standing. Increased knee pain but able to tolerate. Added standing hip to HEP and lumbar ecxtension in standing.    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 Pt would benefit from extension exercises to improve trunk extension for standing activities.         Problem List Patient Active Problem List   Diagnosis Date Noted  . Small bowel obstruction, partial ([place]) 08/30/2013  . Acute renal failure ([place]) 08/30/2013  . 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 N&V (nausea and vomiting) 08/30/2013  . Abdominal pain, acute 08/30/2013  . SBO (small bowel obstruction) ([place]) 08/30/2013  . Hypokalemia 08/30/2013  . Partial small bowel obstruction ([place]) 08/30/2013  . Postmenopausal bleeding 03/18/2013  . OSTEOARTHRITIS, KNEE, LEFT 03/20/2010  . TRICUSPID REGURGITATION, MILD 03/03/2010  . RENAL INSUFFICIENCY 03/02/2010  . MORBID OBESITY 02/18/2010  . PREMATURE ATRIAL CONTRACTIONS 02/18/2010  . PREMATURE VENTRICULAR CONTRACTIONS 02/18/2010  . CARDIAC MURMUR 02/18/2010  . EDEMA 10/20/2009  . HYPOKALEMIA 07/30/2009  . RIB PAIN, RIGHT SIDED 05/26/2008  . ABDOMINAL PAIN, LEFT LOWER QUADRANT 12/31/2007  . ALLERGIC RHINITIS 06/25/2007  . ABNORMAL BLOOD CHEMISTRY , OTHER 06/25/2007  . UNSPECIFIED VITAMIN D DEFICIENCY 04/30/2007  . HYPOCALCEMIA 04/19/2007  . GLAUCOMA NEC 04/19/2007  . HYPERTENSION, ESSENTIAL NOS 04/19/2007  . ASTHMA 04/19/2007  . COLITIS,  ULCERATIVE NOS 04/19/2007  . DISORDER, MENSTRUAL NEC 04/19/2007  . PSORIASIS 04/19/2007  . 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 GOUT NOS 02/21/2006    Dorene Ar, PTA 08/26/2015, 3:15 PM  [facility] [address] [place], [place], [address] Phone: [phone]   Fax:  [phone]  Name: Milly Goggins MRN: [id number] Date of Birth: September 24, 1940

## 2015-09-01 ENCOUNTER — Ambulatory Visit: Payer: Medicare Other | Admitting: Physical Therapy

## 2015-09-01 DIAGNOSIS — M545 Low back pain, unspecified: Secondary | ICD-10-CM

## 2015-09-01 DIAGNOSIS — M62830 Muscle spasm of back: Secondary | ICD-10-CM

## 2015-09-01 DIAGNOSIS — M5386 Other specified dorsopathies, lumbar region: Secondary | ICD-10-CM

## 2015-09-01 DIAGNOSIS — R293 Abnormal posture: Secondary | ICD-10-CM

## 2015-09-01 NOTE — Therapy (Signed)
[place], [place], [address] Phone: [phone]   Fax:  [phone]  Physical Therapy Treatment  Patient Details  Name: Jackie Parker MRN: [id number] Date of Birth: [birth year] Referring Provider: Basil Dess MD  Encounter Date: 09/01/2015      PT End of Session - 09/01/15 1524    Visit Number 5   Number of Visits 12   Date for PT Re-Evaluation 09/23/15   PT Start Time 0300   PT Stop Time 0400   PT Time Calculation (min) 60 min      Past Medical History  Diagnosis Date  . Asthma   . Hypertension   . Bronchitis   . Colitis, ulcerative ([place])     Past Surgical History  Procedure Laterality Date  . Colostomy    . Ileostomy      There were no vitals filed for this visit.  Visit Diagnosis:  Bilateral low back pain without sciatica  Decreased ROM of lumbar spine  Abnormal posture  Back muscle spasm      Subjective Assessment - 09/01/15 1508    Subjective Went to the store yesterday for 30 minutes and pain increased to 9/10 and the knees 9-10/10. I had my shopping cart and pain was still bad.    Diagnostic tests MRI 07/06/2015 stenosis and degerenative changes   Currently in Pain? Yes   Pain Score 5    Pain Location Back   Pain Orientation Lower   Pain Descriptors / Indicators Aching   Aggravating Factors  standing   Pain Relieving Factors sitting                         [facility] Adult PT Treatment/Exercise - 09/01/15 1543    Self-Care   Self-Care ADL's   ADL's Posture and Body Mechanics handout reviewed and modified due to bilateral knee pain   Lumbar Exercises: Aerobic   Stationary Bike Nustep L3 x 5 min   Lumbar Exercises: Standing   Row Strengthening;Both;15 reps;Theraband   Theraband Level (Row) Level 2 (Red)   Shoulder Extension Strengthening;Both;15 reps;Theraband   Theraband Level (Shoulder Extension) Level 2 (Red)   Other Standing Lumbar Exercises step  forawrd and reach with opposite UE and pelvis press forward.x 10 , just stepping forward x10 each side with multifidus palpation.    Lumbar Exercises: Supine   Bent Knee Raise 20 reps   Bent Knee Raise Limitations Scissors level 1, 2    Straight Leg Raise 10 reps   Moist Heat Therapy   Number Minutes Moist Heat 15 Minutes   Moist Heat Location Lumbar Spine   Electrical Stimulation   Electrical Stimulation Location paraspinals lumbar   Electrical Stimulation Action IFC level 13   Electrical Stimulation Parameters to tolerance x 15 min   Electrical Stimulation Goals Pain                PT Education - 09/01/15 1526    Education provided No          PT Short Term Goals - 08/20/15 1512    PT SHORT TERM GOAL #1   Title pt will be I with inital HEP (09/02/2015)   Time 3   Period Weeks   Status On-going   PT SHORT TERM GOAL #2   Title pt will be able to verbalize and demonstrate techinques to decrased low back pain via postural awareness, lifting and carrying mechanics, and HEP (09/02/2015)   Time  3   Period Weeks   Status On-going   PT SHORT TERM GOAL #3   Title pt will demonstrate </= 5/10 pain with standing/ walking for >/= 10 minutes to promote functional endurance (09/02/2015)   Time 6   Period Weeks   Status On-going           PT Long Term Goals - 08/26/15 1514    PT LONG TERM GOAL #1   Title pt will be I with all HEP at discharge (09/23/2015)   Time 6   Period Weeks   Status On-going   PT LONG TERM GOAL #2   Title pt will demonstrate improved flexion and side bending AROM by >/= 10 degrees to assist with ADLs with </= 2/10 pain (09/23/2015)   Time 6   Period Weeks   Status Unable to assess   PT LONG TERM GOAL #3   Title pt will be able to stand/ walk >/= 15 minutes with </=2/10 pain to assist with prlonged standing and walking endurance for ADLs (09/23/2015)   Time 6   Period Weeks   Status On-going   PT LONG TERM GOAL #4   Title pt will increase her  FOTO score to >/= 50 to demonstrate improvement in function at discharge (09/23/2015)   Time 6   Period Weeks   Status On-going               Plan - 09/01/15 1716    Clinical Impression Statement Instructed pt in proper posture and body mechanics with ADLs using handout. 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 Pt shown various options and encouraged to keep spine safe to avoi injury. Pt verbalizes understanding. She missed her MD appointment last week and was rescheduled to February. She is hoping to have her appointment  moved up. Minimal progress toward goals. Pt feels knees are worse since beginning PT for her back. IFC/STim used today to decrease pain. Instructed in standing multifidus activation exercises and postureal strengthening using therabands.    PT Next Visit Plan CHECK GOALS- discuss DC - Standing hip and core exercises as tolerated due to knee pain. 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 Pt would benefit from extension exercises to improve trunk extension for standing activities.         Problem List Patient Active Problem List   Diagnosis Date Noted  . Small bowel obstruction, partial ([place]) 08/30/2013  . Acute renal failure ([place]) 08/30/2013  . 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 N&V (nausea and vomiting) 08/30/2013  . Abdominal pain, acute 08/30/2013  . SBO (small bowel obstruction) ([place]) 08/30/2013  . Hypokalemia 08/30/2013  . Partial small bowel obstruction ([place]) 08/30/2013  . Postmenopausal bleeding 03/18/2013  . OSTEOARTHRITIS, KNEE, LEFT 03/20/2010  . TRICUSPID REGURGITATION, MILD 03/03/2010  . RENAL INSUFFICIENCY 03/02/2010  . MORBID OBESITY 02/18/2010  . PREMATURE ATRIAL CONTRACTIONS 02/18/2010  . PREMATURE VENTRICULAR CONTRACTIONS 02/18/2010  . CARDIAC MURMUR 02/18/2010  . EDEMA 10/20/2009  . HYPOKALEMIA 07/30/2009  . RIB  PAIN, RIGHT SIDED 05/26/2008  . ABDOMINAL PAIN, LEFT LOWER QUADRANT 12/31/2007  . ALLERGIC RHINITIS 06/25/2007  . ABNORMAL BLOOD CHEMISTRY , OTHER 06/25/2007  . UNSPECIFIED VITAMIN D DEFICIENCY 04/30/2007  . HYPOCALCEMIA 04/19/2007  . GLAUCOMA NEC 04/19/2007  . HYPERTENSION, ESSENTIAL NOS 04/19/2007  . ASTHMA 04/19/2007  . COLITIS, ULCERATIVE NOS 04/19/2007  .  DISORDER, MENSTRUAL NEC 04/19/2007  . PSORIASIS 04/19/2007  . 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 GOUT NOS 02/21/2006    Dorene Ar, PTA 09/01/2015, [place], [place], [address] Phone: [phone]   Fax:  [phone]  Name: Jackie Parker MRN: [id number] Date of Birth: 11/29/1940

## 2015-09-01 NOTE — Patient Instructions (Signed)

## 2015-09-03 ENCOUNTER — Ambulatory Visit: Payer: Medicare Other | Admitting: Physical Therapy

## 2015-09-03 DIAGNOSIS — M62830 Muscle spasm of back: Secondary | ICD-10-CM

## 2015-09-03 DIAGNOSIS — R293 Abnormal posture: Secondary | ICD-10-CM

## 2015-09-03 DIAGNOSIS — M5386 Other specified dorsopathies, lumbar region: Secondary | ICD-10-CM

## 2015-09-03 DIAGNOSIS — M545 Low back pain, unspecified: Secondary | ICD-10-CM

## 2015-09-03 NOTE — Therapy (Signed)
[place], [place], [address] Phone: [phone]   Fax:  [phone]  Physical Therapy Treatment / discharge note.   Patient Details  Name: Jackie Parker MRN: [id number] Date of Birth: 1941-04-07 Referring Provider: Basil Dess MD  Encounter Date: 09/03/2015      PT End of Session - 09/03/15 1610    Visit Number 6   Number of Visits 12   Date for PT Re-Evaluation 09/23/15   PT Start Time 0233   PT Stop Time 1610   PT Time Calculation (min) 25 min   Activity Tolerance Patient tolerated treatment well;Patient limited by pain   Behavior During Therapy [facility] for tasks assessed/performed      Past Medical History  Diagnosis Date  . Asthma   . Hypertension   . Bronchitis   . Colitis, ulcerative ([place])     Past Surgical History  Procedure Laterality Date  . Colostomy    . Ileostomy      There were no vitals filed for this visit.  Visit Diagnosis:  Bilateral low back pain without sciatica  Abnormal posture  Decreased ROM of lumbar spine  Back muscle spasm      Subjective Assessment - 09/03/15 1548    Subjective " Tuesday my pain wasn't too bad, today my pain is about a 9/10"   Currently in Pain? Yes   Pain Score 9    Pain Location Back   Pain Orientation Lower   Pain Descriptors / Indicators Aching   Pain Type Chronic pain   Pain Onset More than a month ago   Pain Frequency Constant   Multiple Pain Sites Yes   Pain Score 10   Pain Location Knee   Pain Orientation Right;Left   Pain Descriptors / Indicators Aching;Sharp   Pain Type Chronic pain   Pain Onset More than a month ago   Pain Frequency Constant   Aggravating Factors  standing walking   Pain Relieving Factors N/A            [facility] PT Assessment - 09/03/15 0001    Observation/Other Assessments   Focus on Therapeutic Outcomes (FOTO)  63% limted   AROM   Lumbar Flexion 40   Lumbar Extension 10   Lumbar - Right  Side Bend 15   Lumbar - Left Side Bend 15   Lumbar - Right Rotation 25%   Lumbar - Left Rotation 25%   Strength   Right Hip Flexion 4+/5   Right Hip ABduction 4+/5   Right Hip ADduction 4+/5   Left Hip Flexion 4+/5   Left Hip ABduction 4+/5   Left Hip ADduction 4+/5   Right Knee Flexion 4/5   Right Knee Extension 4/5   Left Knee Flexion 4/5   Left Knee Extension 4-/5                             PT Education - 09/03/15 1610    Education provided Yes   Education Details HEP review   Person(s) Educated Patient   Methods Explanation   Comprehension Verbalized understanding          PT Short Term Goals - 09/03/15 1604    PT SHORT TERM GOAL #1   Title pt will be I with inital HEP (09/02/2015)   Time 3   Period Weeks   Status Partially Met   PT SHORT TERM GOAL #2   Title pt will  be able to verbalize and demonstrate techinques to decrased low back pain via postural awareness, lifting and carrying mechanics, and HEP (09/02/2015)   Time 3   Period Weeks   Status Not Met   PT SHORT TERM GOAL #3   Title pt will demonstrate </= 5/10 pain with standing/ walking for >/= 10 minutes to promote functional endurance (09/02/2015)   Time 6   Period Weeks   Status Not Met           PT Long Term Goals - 09-12-15 1605    PT LONG TERM GOAL #1   Title pt will be I with all HEP at discharge (09/23/2015)   Time 6   Period Weeks   Status Not Met   PT LONG TERM GOAL #2   Title pt will demonstrate improved flexion and side bending AROM by >/= 10 degrees to assist with ADLs with </= 2/10 pain (09/23/2015)   Time 6   Period Weeks   Status Not Met   PT LONG TERM GOAL #3   Title pt will be able to stand/ walk >/= 15 minutes with </=2/10 pain to assist with prlonged standing and walking endurance for ADLs (09/23/2015)   Time 6   Period Weeks   Status Not Met   PT LONG TERM GOAL #4   Title pt will increase her FOTO score to >/= 50 to demonstrate improvement in  function at discharge (09/23/2015)   Time 6   Period Weeks   Status Not Met               Plan - 09-12-2015 1611    Clinical Impression Statement Jackie Parker continues to report increased bil knee pain and low back pain rated at 9-10/10. MMT revealed increased weakness secondary to pain in the knees. She hasn't made any progress on her goals, and regressed on her FOTO score. She reports that she fells like rigt now nothing seems to help, and that after every session she has 1-2 days of increased pain. Discussed with pt that at this point due to increased pain and lack of progress it may be in her best interest to return back to her referring physician for further assessment and pt agreed. pt will be D/C from PT today.    PT Next Visit Plan D/C   PT Home Exercise Plan HEP Review   Consulted and Agree with Plan of Care Patient          G-Codes - 09/12/15 1614    Functional Assessment Tool Used FOTO 63% limited   Functional Limitation Mobility: Walking and moving around   Mobility: Walking and Moving Around Goal Status [phone]) At least 40 percent but less than 60 percent impaired, limited or restricted   Mobility: Walking and Moving Around Discharge Status [phone]) At least 60 percent but less than 80 percent impaired, limited or restricted      Problem List Patient Active Problem List   Diagnosis Date Noted  . Small bowel obstruction, partial ([place]) 08/30/2013  . Acute renal failure ([place]) 08/30/2013  . 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 N&V (nausea and vomiting) 08/30/2013  . Abdominal pain, acute 08/30/2013  . SBO (small bowel obstruction) ([place]) 08/30/2013  . Hypokalemia 08/30/2013  . Partial small bowel obstruction ([place]) 08/30/2013  . Postmenopausal bleeding 03/18/2013  . OSTEOARTHRITIS, KNEE, LEFT 03/20/2010  . TRICUSPID REGURGITATION, MILD 03/03/2010  . RENAL INSUFFICIENCY 03/02/2010  . MORBID OBESITY 02/18/2010  . PREMATURE ATRIAL CONTRACTIONS 02/18/2010  .  PREMATURE VENTRICULAR CONTRACTIONS 02/18/2010  . CARDIAC MURMUR 02/18/2010  . EDEMA 10/20/2009  . HYPOKALEMIA 07/30/2009  . RIB PAIN, RIGHT SIDED 05/26/2008  . ABDOMINAL PAIN, LEFT LOWER QUADRANT 12/31/2007  . ALLERGIC RHINITIS 06/25/2007  . ABNORMAL BLOOD CHEMISTRY , OTHER 06/25/2007  . UNSPECIFIED VITAMIN D DEFICIENCY 04/30/2007  . HYPOCALCEMIA 04/19/2007  . GLAUCOMA NEC 04/19/2007  . HYPERTENSION, ESSENTIAL NOS 04/19/2007  . ASTHMA 04/19/2007  . COLITIS, ULCERATIVE NOS 04/19/2007  . DISORDER, MENSTRUAL NEC 04/19/2007  . PSORIASIS 04/19/2007  . 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 GOUT NOS 02/21/2006   Starr Lake PT, DPT, LAT, ATC  09/03/2015  4:16 PM      [facility] [address] [place], [place], [address] Phone: [phone]   Fax:  [phone]  Name: Jackie Parker MRN: [id number] Date of Birth: Aug 04, 1941    PHYSICAL THERAPY DISCHARGE SUMMARY  Visits from Start of Care: 6  Current functional level related to goals / functional outcomes: FOTO 67% limited   Remaining deficits: Significant low back pain/ bil knee pain, limited trunk mobility. Limited knee pain. Limited standing/ walking endurance.    Education / Equipment: HEP  Plan: Patient agrees to discharge.  Patient goals were not met. Patient is being discharged due to lack of progress.  ?????        Jezebel Pollet PT, DPT, LAT, ATC  09/03/2015  4:17 PM

## 2015-09-08 ENCOUNTER — Encounter: Payer: Medicare Other | Admitting: Physical Therapy

## 2015-09-10 ENCOUNTER — Encounter: Payer: Medicare Other | Admitting: Physical Therapy

## 2015-09-15 ENCOUNTER — Encounter: Payer: Medicare Other | Admitting: Physical Therapy

## 2015-09-17 ENCOUNTER — Encounter: Payer: Medicare Other | Admitting: Physical Therapy

## 2015-12-04 ENCOUNTER — Encounter (HOSPITAL_COMMUNITY): Payer: Self-pay

## 2015-12-04 ENCOUNTER — Ambulatory Visit (HOSPITAL_COMMUNITY)
Admission: RE | Admit: 2015-12-04 | Discharge: 2015-12-04 | Disposition: A | Discharge status: Home / Self Care | Payer: Medicare Other | Source: Ambulatory Visit | Attending: Surgery | Admitting: Surgery

## 2015-12-04 ENCOUNTER — Encounter (HOSPITAL_COMMUNITY)
Admission: RE | Admit: 2015-12-04 | Discharge: 2015-12-04 | Disposition: A | Discharge status: Home / Self Care | Payer: Medicare Other | Source: Ambulatory Visit | Attending: Specialist | Admitting: Specialist

## 2015-12-04 DIAGNOSIS — J9811 Atelectasis: Secondary | ICD-10-CM | POA: Insufficient documentation

## 2015-12-04 DIAGNOSIS — I10 Essential (primary) hypertension: Secondary | ICD-10-CM | POA: Diagnosis not present

## 2015-12-04 DIAGNOSIS — Z01818 Encounter for other preprocedural examination: Secondary | ICD-10-CM | POA: Insufficient documentation

## 2015-12-04 DIAGNOSIS — Z79899 Other long term (current) drug therapy: Secondary | ICD-10-CM | POA: Insufficient documentation

## 2015-12-04 DIAGNOSIS — Z01812 Encounter for preprocedural laboratory examination: Secondary | ICD-10-CM | POA: Insufficient documentation

## 2015-12-04 HISTORY — DX: Unspecified cataract: H26.9

## 2015-12-04 HISTORY — DX: Unspecified osteoarthritis, unspecified site: M19.90

## 2015-12-04 LAB — URINALYSIS, ROUTINE W REFLEX MICROSCOPIC
Glucose, UA: NEGATIVE mg/dL
Hgb urine dipstick: NEGATIVE
Ketones, ur: NEGATIVE mg/dL
Nitrite: NEGATIVE
Protein, ur: 30 mg/dL — AB
Specific Gravity, Urine: 1.023 (ref 1.005–1.030)
pH: 6 (ref 5.0–8.0)

## 2015-12-04 LAB — TYPE AND SCREEN
ABO/RH(D): A POS
Antibody Screen: NEGATIVE

## 2015-12-04 LAB — COMPREHENSIVE METABOLIC PANEL
ALT: 19 U/L (ref 14–54)
AST: 37 U/L (ref 15–41)
Albumin: 3.1 g/dL — ABNORMAL LOW (ref 3.5–5.0)
Alkaline Phosphatase: 115 U/L (ref 38–126)
Anion gap: 11 (ref 5–15)
BUN: 13 mg/dL (ref 6–20)
CO2: 16 mmol/L — ABNORMAL LOW (ref 22–32)
Calcium: 8.4 mg/dL — ABNORMAL LOW (ref 8.9–10.3)
Chloride: 110 mmol/L (ref 101–111)
Creatinine, Ser: 1.45 mg/dL — ABNORMAL HIGH (ref 0.44–1.00)
GFR calc Af Amer: 40 mL/min — ABNORMAL LOW (ref >60)
GFR calc non Af Amer: 35 mL/min — ABNORMAL LOW (ref >60)
Glucose, Bld: 99 mg/dL (ref 65–99)
Potassium: 3.4 mmol/L — ABNORMAL LOW (ref 3.5–5.1)
Sodium: 137 mmol/L (ref 135–145)
Total Bilirubin: 0.6 mg/dL (ref 0.3–1.2)
Total Protein: 7.9 g/dL (ref 6.5–8.1)

## 2015-12-04 LAB — APTT: aPTT: 31 seconds (ref 24–37)

## 2015-12-04 LAB — ABO/RH: ABO/RH(D): A POS

## 2015-12-04 LAB — PROTIME-INR
INR: 1.16 (ref 0.00–1.49)
Prothrombin Time: 14.9 seconds (ref 11.6–15.2)

## 2015-12-04 LAB — CBC
HCT: 45.2 % (ref 36.0–46.0)
Hemoglobin: 15.1 g/dL — ABNORMAL HIGH (ref 12.0–15.0)
MCH: 30.9 pg (ref 26.0–34.0)
MCHC: 33.4 g/dL (ref 30.0–36.0)
MCV: 92.6 fL (ref 78.0–100.0)
Platelets: 190 10*3/uL (ref 150–400)
RBC: 4.88 MIL/uL (ref 3.87–5.11)
RDW: 14 % (ref 11.5–15.5)
WBC: 9.1 10*3/uL (ref 4.0–10.5)

## 2015-12-04 LAB — URINE MICROSCOPIC-ADD ON

## 2015-12-04 LAB — SURGICAL PCR SCREEN
MRSA, PCR: NEGATIVE
Staphylococcus aureus: POSITIVE — AB

## 2015-12-04 NOTE — Pre-Procedure Instructions (Signed)
Jackie Parker  12/04/2015      [facility] - [place], [place] [address] Phone: [phone] Fax: [id number] - [place], [place] [address] Phone: [phone] Fax: [phone]    Your procedure is scheduled on Monday, December 14, 2015  Report to [facility] Admitting at 5:30 A.M.  Call this number if you have problems the morning of surgery:  [phone]   Remember:  Do not eat food or drink liquids after midnight Sunday, December 13, 2015  Take these medicines the morning of surgery with A SIP OF WATER : amLODipine (NORVASC), cetirizine (ZYRTEC),  Fluticasone-Salmeterol (ADVAIR),  brinzolamide (AZOPT) eye drops, bimatoprost (LUMIGAN) eye drops,  if needed: traMADol (ULTRAM) for pain  Stop taking Aspirin, vitamins, fish oil and herbal medications. Do not take any NSAIDs ie: Ibuprofen, Advil, Naproxen, BC and Goody Powder or any medication containing Aspirin; stop Monday, December 07, 2015.  Do not wear jewelry, make-up or nail polish.  Do not wear lotions, powders, or perfumes.  You may not wear deodorant.  Do not shave 48 hours prior to surgery.   Do not bring valuables to the hospital.  [facility] is not responsible for any belongings or valuables.  Contacts, dentures or bridgework may not be worn into surgery.  Leave your suitcase in the car.  After surgery it may be brought to your room.  For patients admitted to the hospital, discharge time will be determined by your treatment team.  Patients discharged the day of surgery will not be allowed to drive home.   Name and phone number of your driver:  Special instructions: [facility] - Preparing for Surgery  Before surgery, you can play an important role.  Because skin is not sterile, your skin needs to be as free of germs  as possible.  You can reduce the number of germs on you skin by washing with CHG (chlorahexidine gluconate) soap before surgery.  CHG is an antiseptic cleaner which kills germs and bonds with the skin to continue killing germs even after washing.  Please DO NOT use if you have an allergy to CHG or antibacterial soaps.  If your skin becomes reddened/irritated stop using the CHG and inform your nurse when you arrive at Short Stay.  Do not shave (including legs and underarms) for at least 48 hours prior to the first CHG shower.  You may shave your face.  Please follow these instructions carefully:   1.  Shower with CHG Soap the night before surgery and the morning of Surgery.  2.  If you choose to wash your hair, wash your hair first as usual with your normal shampoo.  3.  After you shampoo, rinse your hair and body thoroughly to remove the Shampoo.  4.  Use CHG as you would any other liquid soap.  You can apply chg directly  to the skin and wash gently with scrungie or a clean washcloth.  5.  Apply the CHG Soap to your body ONLY FROM THE NECK DOWN.  Do not use on open wounds or open sores.  Avoid contact with your eyes, ears, mouth and genitals (private parts).  Wash genitals (private parts) with your normal soap.  6.  Wash thoroughly, paying special attention to the area where your surgery will be performed.  7.  Thoroughly rinse your body with warm water from the neck down.  8.  DO NOT shower/wash with your normal soap after using and rinsing off the CHG Soap.  9.  Pat yourself dry with a clean towel.            10.  Wear clean pajamas.            11.  Place clean sheets on your bed the night of your first shower and do not sleep with pets.  Day of Surgery  Do not apply any lotions/deodorants the morning of surgery.  Please wear clean clothes to the hospital/surgery center.  Please read over the following fact sheets that you were given. Pain Booklet, Coughing and Deep Breathing, Blood  Transfusion Information, Total Joint Packet, MRSA Information and Surgical Site Infection Prevention

## 2015-12-04 NOTE — Progress Notes (Signed)
Pt denies SOB, chest pain, and being under the care of a cardiologist. Pt denies having a stress test and cardiac cath. Pt denies having a chest x ray within the last year.

## 2015-12-07 NOTE — Progress Notes (Addendum)
Anesthesia Chart Review:  Pt is a 75 year old female scheduled for computer assisted L total knee arthroplasty on 12/14/2015 with Dr. Louanne Skye.   PCP is Dr. Nolene Ebbs, who has cleared pt for surgery.   PMH includes:  HTN, asthma. Never smoker. BMI 40.   Medications include: amlodipine, advair.   Preoperative labs reviewed.    Chest x-ray 12/04/15 reviewed. No acute cardiopulmonary process. Minimal L basilar atelectasis.   EKG 11/02/15 (from [facility]'s office): sinus rhythm. Premature supraventricular complexes. PVCs or premature supraventricular complexes with aberrant ventricular conduction. Horizontal axis. 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   Echo 03/03/10:  - Left ventricle: The cavity size was normal. Systolic function was normal. The estimated ejection fraction was in the range of 55% to 60%. Doppler parameters are consistent with abnormal left ventricular relaxation (grade 1 diastolic dysfunction). - Left atrium: The atrium was mildly dilated. - Impressions: Extremely limited due to poor sound wave transmission; LV function appears to be preserved; cannot R/O wall motion abnormality; suggest TEE or MUGA if clinically indicated.  If no changes, I anticipate pt can proceed with surgery as scheduled.   Willeen Cass, FNP-BC [facility] Short Stay Surgical Center/Anesthesiology Phone: [phone] 12/08/2015 4:37 PM

## 2015-12-12 DIAGNOSIS — N179 Acute kidney failure, unspecified: Secondary | ICD-10-CM

## 2015-12-12 HISTORY — DX: Acute kidney failure, unspecified: N17.9

## 2015-12-13 MED ORDER — CEFAZOLIN SODIUM-DEXTROSE 2-4 GM/100ML-% IV SOLN
2.0000 g | INTRAVENOUS | Status: AC
  Administered 2015-12-14: 2 g via INTRAVENOUS
  Filled 2015-12-13: qty 100

## 2015-12-13 MED ORDER — DEXTROSE 5 % IV SOLN
2.0000 g | INTRAVENOUS | Status: DC
Start: 2015-12-14 — End: 2015-12-13
  Filled 2015-12-13: qty 20

## 2015-12-13 MED ORDER — CHLORHEXIDINE GLUCONATE 4 % EX LIQD: 60.0000 mL | Freq: Once | CUTANEOUS | Status: DC

## 2015-12-14 ENCOUNTER — Inpatient Hospital Stay (HOSPITAL_COMMUNITY): Payer: Medicare Other | Admitting: Emergency Medicine

## 2015-12-14 ENCOUNTER — Inpatient Hospital Stay (HOSPITAL_COMMUNITY)
Admission: RE | Admit: 2015-12-14 | Discharge: 2015-12-17 | DRG: 470 | Disposition: A | Discharge status: Home Health | Payer: Medicare Other | Source: Ambulatory Visit | Attending: Specialist | Admitting: Specialist

## 2015-12-14 ENCOUNTER — Encounter (HOSPITAL_COMMUNITY): Payer: Self-pay | Admitting: *Deleted

## 2015-12-14 ENCOUNTER — Encounter (HOSPITAL_COMMUNITY)
Admission: RE | Disposition: A | Discharge status: Home Health | Payer: Self-pay | Source: Ambulatory Visit | Attending: Specialist

## 2015-12-14 ENCOUNTER — Inpatient Hospital Stay (HOSPITAL_COMMUNITY): Payer: Medicare Other | Admitting: Anesthesiology

## 2015-12-14 DIAGNOSIS — M1712 Unilateral primary osteoarthritis, left knee: Principal | ICD-10-CM | POA: Diagnosis present

## 2015-12-14 DIAGNOSIS — M25452 Effusion, left hip: Secondary | ICD-10-CM

## 2015-12-14 DIAGNOSIS — H409 Unspecified glaucoma: Secondary | ICD-10-CM | POA: Diagnosis present

## 2015-12-14 DIAGNOSIS — M1711 Unilateral primary osteoarthritis, right knee: Secondary | ICD-10-CM | POA: Diagnosis present

## 2015-12-14 DIAGNOSIS — I10 Essential (primary) hypertension: Secondary | ICD-10-CM | POA: Diagnosis present

## 2015-12-14 DIAGNOSIS — Z79899 Other long term (current) drug therapy: Secondary | ICD-10-CM | POA: Diagnosis not present

## 2015-12-14 DIAGNOSIS — Z6841 Body Mass Index (BMI) 40.0 and over, adult: Secondary | ICD-10-CM

## 2015-12-14 DIAGNOSIS — M7989 Other specified soft tissue disorders: Secondary | ICD-10-CM

## 2015-12-14 DIAGNOSIS — Z7951 Long term (current) use of inhaled steroids: Secondary | ICD-10-CM | POA: Diagnosis not present

## 2015-12-14 DIAGNOSIS — E559 Vitamin D deficiency, unspecified: Secondary | ICD-10-CM | POA: Diagnosis not present

## 2015-12-14 DIAGNOSIS — M25562 Pain in left knee: Secondary | ICD-10-CM | POA: Diagnosis present

## 2015-12-14 HISTORY — DX: Acute kidney failure, unspecified: N17.9

## 2015-12-14 HISTORY — DX: Psoriasis, unspecified: L40.9

## 2015-12-14 HISTORY — PX: TOTAL KNEE ARTHROPLASTY: SHX125

## 2015-12-14 HISTORY — PX: KNEE ARTHROPLASTY: SHX992

## 2015-12-14 SURGERY — ARTHROPLASTY, KNEE, TOTAL, USING IMAGELESS COMPUTER-ASSISTED NAVIGATION
Anesthesia: Regional | Site: Knee | Laterality: Left

## 2015-12-14 MED ORDER — OXYCODONE HCL 5 MG PO TABS
5.0000 mg | ORAL_TABLET | ORAL | Status: DC | PRN
  Administered 2015-12-14: 5 mg via ORAL
  Administered 2015-12-15 – 2015-12-16 (×3): 10 mg via ORAL
  Filled 2015-12-14 (×4): qty 2

## 2015-12-14 MED ORDER — MENTHOL 3 MG MT LOZG: 1.0000 | LOZENGE | OROMUCOSAL | Status: DC | PRN

## 2015-12-14 MED ORDER — MORPHINE SULFATE (PF) 2 MG/ML IV SOLN
1.0000 mg | INTRAVENOUS | Status: DC | PRN
  Administered 2015-12-14 (×2): 1 mg via INTRAVENOUS
  Filled 2015-12-14 (×2): qty 1

## 2015-12-14 MED ORDER — FLEET ENEMA 7-19 GM/118ML RE ENEM: 1.0000 | ENEMA | Freq: Once | RECTAL | Status: DC | PRN

## 2015-12-14 MED ORDER — VITAMIN D 1000 UNITS PO TABS
1000.0000 [IU] | ORAL_TABLET | Freq: Every day | ORAL | Status: DC
  Administered 2015-12-15 – 2015-12-17 (×3): 1000 [IU] via ORAL
  Filled 2015-12-14 (×3): qty 1

## 2015-12-14 MED ORDER — LATANOPROST 0.005 % OP SOLN
1.0000 [drp] | Freq: Every day | OPHTHALMIC | Status: DC
  Administered 2015-12-15 – 2015-12-16 (×2): 1 [drp] via OPHTHALMIC
  Filled 2015-12-14 (×2): qty 2.5

## 2015-12-14 MED ORDER — PROPOFOL 10 MG/ML IV BOLUS
INTRAVENOUS | Status: DC | PRN
  Administered 2015-12-14: 170 mg via INTRAVENOUS

## 2015-12-14 MED ORDER — ACETAMINOPHEN 650 MG RE SUPP: 650.0000 mg | Freq: Four times a day (QID) | RECTAL | Status: DC | PRN

## 2015-12-14 MED ORDER — PHENYLEPHRINE HCL 10 MG/ML IJ SOLN
10.0000 mg | INTRAVENOUS | Status: DC | PRN
  Administered 2015-12-14: 50 ug/min via INTRAVENOUS

## 2015-12-14 MED ORDER — MIDAZOLAM HCL 5 MG/5ML IJ SOLN
INTRAMUSCULAR | Status: DC | PRN
  Administered 2015-12-14: 1 mg via INTRAVENOUS

## 2015-12-14 MED ORDER — IPRATROPIUM-ALBUTEROL 20-100 MCG/ACT IN AERS
INHALATION_SPRAY | RESPIRATORY_TRACT | Status: DC | PRN
  Administered 2015-12-14: 6 via RESPIRATORY_TRACT

## 2015-12-14 MED ORDER — ROCURONIUM BROMIDE 50 MG/5ML IV SOLN
INTRAVENOUS | Status: AC
  Filled 2015-12-14: qty 1

## 2015-12-14 MED ORDER — SODIUM CHLORIDE 0.9 % IJ SOLN
INTRAMUSCULAR | Status: AC
  Filled 2015-12-14: qty 10

## 2015-12-14 MED ORDER — PROPOFOL 10 MG/ML IV BOLUS
INTRAVENOUS | Status: AC
Start: 2015-12-14 — End: 2015-12-14
  Filled 2015-12-14: qty 20

## 2015-12-14 MED ORDER — LACTATED RINGERS IV SOLN
INTRAVENOUS | Status: DC | PRN
  Administered 2015-12-14 (×3): via INTRAVENOUS

## 2015-12-14 MED ORDER — SUGAMMADEX SODIUM 200 MG/2ML IV SOLN
INTRAVENOUS | Status: AC
  Filled 2015-12-14: qty 2

## 2015-12-14 MED ORDER — BUPIVACAINE HCL (PF) 0.5 % IJ SOLN
INTRAMUSCULAR | Status: AC
  Filled 2015-12-14: qty 30

## 2015-12-14 MED ORDER — DOCUSATE SODIUM 100 MG PO CAPS
100.0000 mg | ORAL_CAPSULE | Freq: Two times a day (BID) | ORAL | Status: DC
  Administered 2015-12-15 – 2015-12-16 (×3): 100 mg via ORAL
  Filled 2015-12-14 (×5): qty 1

## 2015-12-14 MED ORDER — BUPIVACAINE LIPOSOME 1.3 % IJ SUSP
INTRAMUSCULAR | Status: DC | PRN
  Administered 2015-12-14: 20 mL

## 2015-12-14 MED ORDER — PHENYLEPHRINE 40 MCG/ML (10ML) SYRINGE FOR IV PUSH (FOR BLOOD PRESSURE SUPPORT)
PREFILLED_SYRINGE | INTRAVENOUS | Status: AC
  Filled 2015-12-14: qty 10

## 2015-12-14 MED ORDER — ROCURONIUM BROMIDE 100 MG/10ML IV SOLN
INTRAVENOUS | Status: DC | PRN
  Administered 2015-12-14: 50 mg via INTRAVENOUS

## 2015-12-14 MED ORDER — MONTELUKAST SODIUM 10 MG PO TABS
10.0000 mg | ORAL_TABLET | Freq: Every day | ORAL | Status: DC
  Administered 2015-12-14 – 2015-12-16 (×3): 10 mg via ORAL
  Filled 2015-12-14 (×3): qty 1

## 2015-12-14 MED ORDER — BUPIVACAINE-EPINEPHRINE (PF) 0.5% -1:200000 IJ SOLN
INTRAMUSCULAR | Status: DC | PRN
  Administered 2015-12-14: 30 mL via PERINEURAL

## 2015-12-14 MED ORDER — DIPHENHYDRAMINE HCL 12.5 MG/5ML PO ELIX: 12.5000 mg | ORAL_SOLUTION | ORAL | Status: DC | PRN

## 2015-12-14 MED ORDER — METOCLOPRAMIDE HCL 5 MG/ML IJ SOLN: 5.0000 mg | Freq: Three times a day (TID) | INTRAMUSCULAR | Status: DC | PRN

## 2015-12-14 MED ORDER — ONDANSETRON HCL 4 MG/2ML IJ SOLN: 4.0000 mg | Freq: Four times a day (QID) | INTRAMUSCULAR | Status: DC | PRN

## 2015-12-14 MED ORDER — FENTANYL CITRATE (PF) 250 MCG/5ML IJ SOLN
INTRAMUSCULAR | Status: AC
  Filled 2015-12-14: qty 5

## 2015-12-14 MED ORDER — LABETALOL HCL 5 MG/ML IV SOLN
INTRAVENOUS | Status: DC | PRN
  Administered 2015-12-14: 5 mg via INTRAVENOUS

## 2015-12-14 MED ORDER — TRAMADOL HCL 50 MG PO TABS: 50.0000 mg | ORAL_TABLET | Freq: Four times a day (QID) | ORAL | Status: DC | PRN

## 2015-12-14 MED ORDER — KETOROLAC TROMETHAMINE 15 MG/ML IJ SOLN
7.5000 mg | Freq: Four times a day (QID) | INTRAMUSCULAR | Status: AC
  Administered 2015-12-14 – 2015-12-15 (×4): 7.5 mg via INTRAVENOUS
  Filled 2015-12-14 (×4): qty 1

## 2015-12-14 MED ORDER — BRINZOLAMIDE 1 % OP SUSP
1.0000 [drp] | Freq: Two times a day (BID) | OPHTHALMIC | Status: DC
  Administered 2015-12-14 – 2015-12-17 (×6): 1 [drp] via OPHTHALMIC
  Filled 2015-12-14 (×2): qty 10

## 2015-12-14 MED ORDER — METOCLOPRAMIDE HCL 5 MG PO TABS: 5.0000 mg | ORAL_TABLET | Freq: Three times a day (TID) | ORAL | Status: DC | PRN

## 2015-12-14 MED ORDER — SUGAMMADEX SODIUM 200 MG/2ML IV SOLN
INTRAVENOUS | Status: DC | PRN
  Administered 2015-12-14: 200 mg via INTRAVENOUS

## 2015-12-14 MED ORDER — POTASSIUM CHLORIDE IN NACL 20-0.9 MEQ/L-% IV SOLN
INTRAVENOUS | Status: DC
  Administered 2015-12-14: 16:00:00 via INTRAVENOUS
  Filled 2015-12-14 (×2): qty 1000

## 2015-12-14 MED ORDER — ONDANSETRON HCL 4 MG/2ML IJ SOLN
INTRAMUSCULAR | Status: DC | PRN
  Administered 2015-12-14: 4 mg via INTRAVENOUS

## 2015-12-14 MED ORDER — LORATADINE 10 MG PO TABS
10.0000 mg | ORAL_TABLET | Freq: Every day | ORAL | Status: DC
  Administered 2015-12-15 – 2015-12-16 (×2): 10 mg via ORAL
  Filled 2015-12-14 (×3): qty 1

## 2015-12-14 MED ORDER — PNEUMOCOCCAL VAC POLYVALENT 25 MCG/0.5ML IJ INJ: 0.5000 mL | INJECTION | INTRAMUSCULAR | Status: DC

## 2015-12-14 MED ORDER — LIDOCAINE HCL (CARDIAC) 20 MG/ML IV SOLN
INTRAVENOUS | Status: AC
  Filled 2015-12-14: qty 5

## 2015-12-14 MED ORDER — ONDANSETRON HCL 4 MG/2ML IJ SOLN
INTRAMUSCULAR | Status: AC
Start: 2015-12-14 — End: 2015-12-14
  Filled 2015-12-14: qty 2

## 2015-12-14 MED ORDER — ONDANSETRON HCL 4 MG PO TABS: 4.0000 mg | ORAL_TABLET | Freq: Four times a day (QID) | ORAL | Status: DC | PRN

## 2015-12-14 MED ORDER — FLUTICASONE FUROATE-VILANTEROL 200-25 MCG/INH IN AEPB
1.0000 | INHALATION_SPRAY | Freq: Every day | RESPIRATORY_TRACT | Status: DC
  Filled 2015-12-14: qty 28

## 2015-12-14 MED ORDER — SUCCINYLCHOLINE CHLORIDE 20 MG/ML IJ SOLN
INTRAMUSCULAR | Status: AC
  Filled 2015-12-14: qty 1

## 2015-12-14 MED ORDER — ACETAMINOPHEN 325 MG PO TABS: 650.0000 mg | ORAL_TABLET | Freq: Four times a day (QID) | ORAL | Status: DC | PRN

## 2015-12-14 MED ORDER — BUPIVACAINE-EPINEPHRINE 0.5% -1:200000 IJ SOLN: INTRAMUSCULAR | Status: DC | PRN

## 2015-12-14 MED ORDER — OXYCODONE HCL 5 MG/5ML PO SOLN: 5.0000 mg | Freq: Once | ORAL | Status: AC | PRN

## 2015-12-14 MED ORDER — EPHEDRINE SULFATE 50 MG/ML IJ SOLN
INTRAMUSCULAR | Status: AC
  Filled 2015-12-14: qty 1

## 2015-12-14 MED ORDER — HYDROCODONE-ACETAMINOPHEN 7.5-325 MG PO TABS
1.0000 | ORAL_TABLET | Freq: Four times a day (QID) | ORAL | Status: DC
  Administered 2015-12-14 – 2015-12-17 (×10): 1 via ORAL
  Filled 2015-12-14 (×10): qty 1

## 2015-12-14 MED ORDER — POLYETHYLENE GLYCOL 3350 17 G PO PACK: 17.0000 g | PACK | Freq: Every day | ORAL | Status: DC | PRN

## 2015-12-14 MED ORDER — BISACODYL 5 MG PO TBEC: 5.0000 mg | DELAYED_RELEASE_TABLET | Freq: Every day | ORAL | Status: DC | PRN

## 2015-12-14 MED ORDER — FENTANYL CITRATE (PF) 100 MCG/2ML IJ SOLN
INTRAMUSCULAR | Status: DC | PRN
  Administered 2015-12-14 (×5): 50 ug via INTRAVENOUS
  Administered 2015-12-14: 25 ug via INTRAVENOUS
  Administered 2015-12-14 (×2): 50 ug via INTRAVENOUS

## 2015-12-14 MED ORDER — OXYCODONE HCL 5 MG PO TABS
ORAL_TABLET | ORAL | Status: AC
  Filled 2015-12-14: qty 2

## 2015-12-14 MED ORDER — BUPIVACAINE LIPOSOME 1.3 % IJ SUSP
20.0000 mL | INTRAMUSCULAR | Status: DC
  Filled 2015-12-14: qty 20

## 2015-12-14 MED ORDER — BUPIVACAINE-EPINEPHRINE (PF) 0.5% -1:200000 IJ SOLN
INTRAMUSCULAR | Status: AC
  Filled 2015-12-14: qty 30

## 2015-12-14 MED ORDER — ALBUTEROL SULFATE HFA 108 (90 BASE) MCG/ACT IN AERS
INHALATION_SPRAY | RESPIRATORY_TRACT | Status: AC
  Filled 2015-12-14: qty 6.7

## 2015-12-14 MED ORDER — HYDROMORPHONE HCL 1 MG/ML IJ SOLN
INTRAMUSCULAR | Status: AC
  Filled 2015-12-14: qty 1

## 2015-12-14 MED ORDER — HYDROMORPHONE HCL 1 MG/ML IJ SOLN
0.2500 mg | INTRAMUSCULAR | Status: DC | PRN
  Administered 2015-12-14 (×4): 0.5 mg via INTRAVENOUS

## 2015-12-14 MED ORDER — AMLODIPINE BESYLATE 5 MG PO TABS
5.0000 mg | ORAL_TABLET | Freq: Every day | ORAL | Status: DC
Start: 2015-12-15 — End: 2015-12-17
  Administered 2015-12-15 – 2015-12-17 (×3): 5 mg via ORAL
  Filled 2015-12-14 (×3): qty 1

## 2015-12-14 MED ORDER — LIDOCAINE HCL (CARDIAC) 20 MG/ML IV SOLN
INTRAVENOUS | Status: DC | PRN
  Administered 2015-12-14: 60 mg via INTRAVENOUS

## 2015-12-14 MED ORDER — PHENOL 1.4 % MT LIQD: 1.0000 | OROMUCOSAL | Status: DC | PRN

## 2015-12-14 MED ORDER — ALUM & MAG HYDROXIDE-SIMETH 200-200-20 MG/5ML PO SUSP: 30.0000 mL | ORAL | Status: DC | PRN

## 2015-12-14 MED ORDER — 0.9 % SODIUM CHLORIDE (POUR BTL) OPTIME
TOPICAL | Status: DC | PRN
  Administered 2015-12-14: 1000 mL

## 2015-12-14 MED ORDER — ASPIRIN EC 325 MG PO TBEC
325.0000 mg | DELAYED_RELEASE_TABLET | Freq: Every day | ORAL | Status: DC
  Administered 2015-12-15 – 2015-12-17 (×3): 325 mg via ORAL
  Filled 2015-12-14 (×3): qty 1

## 2015-12-14 MED ORDER — OXYCODONE HCL 5 MG PO TABS
5.0000 mg | ORAL_TABLET | Freq: Once | ORAL | Status: AC | PRN
  Administered 2015-12-14: 5 mg via ORAL

## 2015-12-14 MED ORDER — SODIUM CHLORIDE 0.9 % IR SOLN
Status: DC | PRN
  Administered 2015-12-14: 3000 mL

## 2015-12-14 MED ORDER — FERROUS SULFATE 325 (65 FE) MG PO TABS
325.0000 mg | ORAL_TABLET | Freq: Three times a day (TID) | ORAL | Status: DC
  Administered 2015-12-14 – 2015-12-17 (×8): 325 mg via ORAL
  Filled 2015-12-14 (×9): qty 1

## 2015-12-14 MED ORDER — MIDAZOLAM HCL 2 MG/2ML IJ SOLN
INTRAMUSCULAR | Status: AC
Start: 2015-12-14 — End: 2015-12-14
  Filled 2015-12-14: qty 2

## 2015-12-14 MED ORDER — BUPIVACAINE HCL (PF) 0.5 % IJ SOLN
INTRAMUSCULAR | Status: DC | PRN
  Administered 2015-12-14: 20 mL

## 2015-12-14 SURGICAL SUPPLY — 88 items
ADH SKN CLS APL DERMABOND .7 (GAUZE/BANDAGES/DRESSINGS)
APL SKNCLS STERI-STRIP NONHPOA (GAUZE/BANDAGES/DRESSINGS) ×2
BANDAGE ACE 4X5 VEL STRL LF (GAUZE/BANDAGES/DRESSINGS) ×2 IMPLANT
BANDAGE ELASTIC 4 VELCRO ST LF (GAUZE/BANDAGES/DRESSINGS) ×3 IMPLANT
BANDAGE ESMARK 6X9 LF (GAUZE/BANDAGES/DRESSINGS) ×1 IMPLANT
BENZOIN TINCTURE PRP APPL 2/3 (GAUZE/BANDAGES/DRESSINGS) ×4 IMPLANT
BLADE SAG 18X100X1.27 (BLADE) ×3 IMPLANT
BLADE SAGITTAL 25.0X1.27X90 (BLADE) IMPLANT
BLADE SAGITTAL 25.0X1.27X90MM (BLADE)
BLADE SAW SGTL 13X75X1.27 (BLADE) ×3 IMPLANT
BNDG CMPR 9X6 STRL LF SNTH (GAUZE/BANDAGES/DRESSINGS) ×1
BNDG CMPR MED 10X6 ELC LF (GAUZE/BANDAGES/DRESSINGS) ×1
BNDG ELASTIC 6X10 VLCR STRL LF (GAUZE/BANDAGES/DRESSINGS) ×3 IMPLANT
BNDG ESMARK 6X9 LF (GAUZE/BANDAGES/DRESSINGS) ×3
CAP KNEE TOTAL 3 SIGMA ×2 IMPLANT
CEMENT HV SMART SET (Cement) ×4 IMPLANT
CLOSURE WOUND 1/2 X4 (GAUZE/BANDAGES/DRESSINGS) ×2
COVER SURGICAL LIGHT HANDLE (MISCELLANEOUS) ×3 IMPLANT
CUFF TOURNIQUET SINGLE 34IN LL (TOURNIQUET CUFF) ×2 IMPLANT
CUFF TOURNIQUET SINGLE 44IN (TOURNIQUET CUFF) IMPLANT
DERMABOND ADVANCED (GAUZE/BANDAGES/DRESSINGS)
DERMABOND ADVANCED .7 DNX12 (GAUZE/BANDAGES/DRESSINGS) ×1 IMPLANT
DRAPE IMP U-DRAPE 54X76 (DRAPES) ×3 IMPLANT
DRAPE ORTHO SPLIT 77X108 STRL (DRAPES) ×6
DRAPE PROXIMA HALF (DRAPES) ×2 IMPLANT
DRAPE SURG ORHT 6 SPLT 77X108 (DRAPES) ×2 IMPLANT
DRAPE U-SHAPE 47X51 STRL (DRAPES) ×3 IMPLANT
DRSG PAD ABDOMINAL 8X10 ST (GAUZE/BANDAGES/DRESSINGS) ×3 IMPLANT
DURAPREP 26ML APPLICATOR (WOUND CARE) ×3 IMPLANT
ELECT REM PT RETURN 9FT ADLT (ELECTROSURGICAL) ×3
ELECTRODE REM PT RTRN 9FT ADLT (ELECTROSURGICAL) ×1 IMPLANT
EVACUATOR 1/8 PVC DRAIN (DRAIN) IMPLANT
FACESHIELD WRAPAROUND (MASK) ×6 IMPLANT
FACESHIELD WRAPAROUND OR TEAM (MASK) ×2 IMPLANT
GAUZE SPONGE 4X4 12PLY STRL (GAUZE/BANDAGES/DRESSINGS) ×3 IMPLANT
GAUZE XEROFORM 1X8 LF (GAUZE/BANDAGES/DRESSINGS) ×2 IMPLANT
GLOVE BIOGEL PI IND STRL 8 (GLOVE) ×1 IMPLANT
GLOVE BIOGEL PI INDICATOR 8 (GLOVE) ×2
GLOVE ECLIPSE 9.0 STRL (GLOVE) ×3 IMPLANT
GLOVE ORTHO TXT STRL SZ7.5 (GLOVE) ×3 IMPLANT
GLOVE SURG 8.5 LATEX PF (GLOVE) ×3 IMPLANT
GOWN STRL REUS W/ TWL LRG LVL3 (GOWN DISPOSABLE) ×1 IMPLANT
GOWN STRL REUS W/TWL 2XL LVL3 (GOWN DISPOSABLE) ×6 IMPLANT
GOWN STRL REUS W/TWL LRG LVL3 (GOWN DISPOSABLE) ×3
HANDPIECE INTERPULSE COAX TIP (DISPOSABLE) ×3
IMMOBILIZER KNEE 22 UNIV (SOFTGOODS) ×2 IMPLANT
KIT BASIN OR (CUSTOM PROCEDURE TRAY) ×3 IMPLANT
KIT ROOM TURNOVER OR (KITS) ×3 IMPLANT
MANIFOLD NEPTUNE II (INSTRUMENTS) ×3 IMPLANT
MARKER SPHERE PSV REFLC THRD 5 (MARKER) ×9 IMPLANT
NEEDLE 22X1 1/2 (OR ONLY) (NEEDLE) ×3 IMPLANT
NS IRRIG 1000ML POUR BTL (IV SOLUTION) ×3 IMPLANT
PACK TOTAL JOINT (CUSTOM PROCEDURE TRAY) ×3 IMPLANT
PACK UNIVERSAL I (CUSTOM PROCEDURE TRAY) ×3 IMPLANT
PAD ARMBOARD 7.5X6 YLW CONV (MISCELLANEOUS) ×6 IMPLANT
PAD CAST 4YDX4 CTTN HI CHSV (CAST SUPPLIES) ×1 IMPLANT
PADDING CAST COTTON 4X4 STRL (CAST SUPPLIES) ×3
PADDING CAST COTTON 6X4 STRL (CAST SUPPLIES) ×3 IMPLANT
PIN SCHANZ 4MM 130MM (PIN) ×12 IMPLANT
PLATE ROT INSERT 12.5MM SIZE 3 (Plate) ×2 IMPLANT
SET HNDPC FAN SPRY TIP SCT (DISPOSABLE) ×1 IMPLANT
SPONGE LAP 18X18 X RAY DECT (DISPOSABLE) ×2 IMPLANT
STAPLER VISISTAT 35W (STAPLE) ×2 IMPLANT
STRIP CLOSURE SKIN 1/2X4 (GAUZE/BANDAGES/DRESSINGS) ×2 IMPLANT
SUCTION FRAZIER HANDLE 10FR (MISCELLANEOUS) ×2
SUCTION TUBE FRAZIER 10FR DISP (MISCELLANEOUS) IMPLANT
SUT BONE WAX W31G (SUTURE) IMPLANT
SUT ETHILON 3 0 PS 1 (SUTURE) ×3 IMPLANT
SUT VIC AB 0 CT1 27 (SUTURE) ×6
SUT VIC AB 0 CT1 27XBRD ANBCTR (SUTURE) ×2 IMPLANT
SUT VIC AB 1 CT1 27 (SUTURE) ×9
SUT VIC AB 1 CT1 27XBRD ANBCTR (SUTURE) ×6 IMPLANT
SUT VIC AB 2-0 CT1 27 (SUTURE) ×6
SUT VIC AB 2-0 CT1 TAPERPNT 27 (SUTURE) ×3 IMPLANT
SUT VIC AB 3-0 FS2 27 (SUTURE) ×3 IMPLANT
SUT VIC AB 3-0 X1 27 (SUTURE) ×2 IMPLANT
SUT VICRYL 4-0 PS2 18IN ABS (SUTURE) ×1 IMPLANT
SYR 50ML LL SCALE MARK (SYRINGE) ×2 IMPLANT
SYR CONTROL 10ML LL (SYRINGE) IMPLANT
SYRINGE 60CC LL (MISCELLANEOUS) ×2 IMPLANT
TOWEL OR 17X24 6PK STRL BLUE (TOWEL DISPOSABLE) ×3 IMPLANT
TOWEL OR 17X26 10 PK STRL BLUE (TOWEL DISPOSABLE) ×3 IMPLANT
TOWER CARTRIDGE SMART MIX (DISPOSABLE) ×1 IMPLANT
TRAY FOLEY CATH 16FRSI W/METER (SET/KITS/TRAYS/PACK) ×2 IMPLANT
TUBE CONNECTING 12'X1/4 (SUCTIONS) ×1
TUBE CONNECTING 12X1/4 (SUCTIONS) ×1 IMPLANT
WATER STERILE IRR 1000ML POUR (IV SOLUTION) ×4 IMPLANT
YANKAUER SUCT BULB TIP NO VENT (SUCTIONS) ×2 IMPLANT

## 2015-12-14 NOTE — Interval H&P Note (Signed)
Patient was seen and examined in the preop holding area. There has been no interval  Change in this patient's exam preop  history and physical exam  Lab tests and images have been examined and reviewed.  The Risks benefits and alternative treatments have been discussed  extensively,questions answered.  The patient has elected to undergo the discussed surgical treatment.

## 2015-12-14 NOTE — Progress Notes (Signed)
Orthopedic Tech Progress Note Patient Details:  Jackie Parker 04/21/1941 [id number]  CPM Left Knee CPM Left Knee: On Left Knee Flexion (Degrees): 60 Left Knee Extension (Degrees): 0 Additional Comments: Trapeze bar and foot roll   [name] 12/14/2015, 11:32 AM

## 2015-12-14 NOTE — Discharge Instructions (Signed)
Keep knee incision dry for 5 days post op then may wet while bathing. Therapy daily and CPM goal full extension and greater than 90 degrees flexion. Call if fever or chills or increased drainage. Go to ER if acutely short of breath or call for ambulance. Return for follow up in 2 weeks. May full weight bear on the surgical leg unless told otherwise. Use knee immobilizer until able to straight leg raise off bed with knee stable. In house walking for first 2 weeks. INSTRUCTIONS AFTER JOINT REPLACEMENT   o Remove items at home which could result in a fall. This includes throw rugs or furniture in walking pathways o ICE to the affected joint every three hours while awake for 30 minutes at a time, for at least the first 3-5 days, and then as needed for pain and swelling.  Continue to use ice for pain and swelling. You may notice swelling that will progress down to the foot and ankle.  This is normal after surgery.  Elevate your leg when you are not up walking on it.   o Continue to use the breathing machine you got in the hospital (incentive spirometer) which will help keep your temperature down.  It is common for your temperature to cycle up and down following surgery, especially at night when you are not up moving around and exerting yourself.  The breathing machine keeps your lungs expanded and your temperature down.   DIET:  As you were doing prior to hospitalization, we recommend a well-balanced diet.  DRESSING / WOUND CARE / SHOWERING  You may change your dressing 3-5 days after surgery.  Then change the dressing every day with sterile gauze.  Please use good hand washing techniques before changing the dressing.  Do not use any lotions or creams on the incision until instructed by your surgeon. and You may shower 3 days after surgery, but keep the wounds dry during showering.  You may use an occlusive plastic wrap (Press'n Seal for example), NO SOAKING/SUBMERGING IN THE BATHTUB.  If the  bandage gets wet, change with a clean dry gauze.  If the incision gets wet, pat the wound dry with a clean towel.  ACTIVITY  o Increase activity slowly as tolerated, but follow the weight bearing instructions below.   o No driving for 6 weeks or until further direction given by your physician.  You cannot drive while taking narcotics.  o No lifting or carrying greater than 10 lbs. until further directed by your surgeon. o Avoid periods of inactivity such as sitting longer than an hour when not asleep. This helps prevent blood clots.  o You may return to work once you are authorized by your doctor.     WEIGHT BEARING   Weight bearing as tolerated with assist device (walker, cane, etc) as directed, use it as long as suggested by your surgeon or therapist, typically at least 4-6 weeks.   EXERCISES  Results after joint replacement surgery are often greatly improved when you follow the exercise, range of motion and muscle strengthening exercises prescribed by your doctor. Safety measures are also important to protect the joint from further injury. Any time any of these exercises cause you to have increased pain or swelling, decrease what you are doing until you are comfortable again and then slowly increase them. If you have problems or questions, call your caregiver or physical therapist for advice.   Rehabilitation is important following a joint replacement. After just a few days  of immobilization, the muscles of the leg can become weakened and shrink (atrophy).  These exercises are designed to build up the tone and strength of the thigh and leg muscles and to improve motion. Often times heat used for twenty to thirty minutes before working out will loosen up your tissues and help with improving the range of motion but do not use heat for the first two weeks following surgery (sometimes heat can increase post-operative swelling).   These exercises can be done on a training (exercise) mat, on the  floor, on a table or on a bed. Use whatever works the best and is most comfortable for you.    Use music or television while you are exercising so that the exercises are a pleasant break in your day. This will make your life better with the exercises acting as a break in your routine that you can look forward to.   Perform all exercises about fifteen times, three times per day or as directed.  You should exercise both the operative leg and the other leg as well.  Exercises include:    Quad Sets - Tighten up the muscle on the front of the thigh (Quad) and hold for 5-10 seconds.    Straight Leg Raises - With your knee straight (if you were given a brace, keep it on), lift the leg to 60 degrees, hold for 3 seconds, and slowly lower the leg.  Perform this exercise against resistance later as your leg gets stronger.   Leg Slides: Lying on your back, slowly slide your foot toward your buttocks, bending your knee up off the floor (only go as far as is comfortable). Then slowly slide your foot back down until your leg is flat on the floor again.   Angel Wings: Lying on your back spread your legs to the side as far apart as you can without causing discomfort.   Hamstring Strength:  Lying on your back, push your heel against the floor with your leg straight by tightening up the muscles of your buttocks.  Repeat, but this time bend your knee to a comfortable angle, and push your heel against the floor.  You may put a pillow under the heel to make it more comfortable if necessary.   A rehabilitation program following joint replacement surgery can speed recovery and prevent re-injury in the future due to weakened muscles. Contact your doctor or a physical therapist for more information on knee rehabilitation.    CONSTIPATION  Constipation is defined medically as fewer than three stools per week and severe constipation as less than one stool per week.  Even if you have a regular bowel pattern at home, your  normal regimen is likely to be disrupted due to multiple reasons following surgery.  Combination of anesthesia, postoperative narcotics, change in appetite and fluid intake all can affect your bowels.   YOU MUST use at least one of the following options; they are listed in order of increasing strength to get the job done.  They are all available over the counter, and you may need to use some, POSSIBLY even all of these options:    Drink plenty of fluids (prune juice may be helpful) and high fiber foods Colace 100 mg by mouth twice a day  Senokot for constipation as directed and as needed Dulcolax (bisacodyl), take with full glass of water  Miralax (polyethylene glycol) once or twice a day as needed.  If you have tried all these things and are unable  to have a bowel movement in the first 3-4 days after surgery call either your surgeon or your primary doctor.    If you experience loose stools or diarrhea, hold the medications until you stool forms back up.  If your symptoms do not get better within 1 week or if they get worse, check with your doctor.  If you experience "the worst abdominal pain ever" or develop nausea or vomiting, please contact the office immediately for further recommendations for treatment.   ITCHING:  If you experience itching with your medications, try taking only a single pain pill, or even half a pain pill at a time.  You can also use Benadryl over the counter for itching or also to help with sleep.   TED HOSE STOCKINGS:  Use stockings on both legs until for at least 2 weeks or as directed by physician office. They may be removed at night for sleeping.  MEDICATIONS:  See your medication summary on the After Visit Summary that nursing will review with you.  You may have some home medications which will be placed on hold until you complete the course of blood thinner medication.  It is important for you to complete the blood thinner medication as prescribed.  PRECAUTIONS:   If you experience chest pain or shortness of breath - call 911 immediately for transfer to the hospital emergency department.   If you develop a fever greater that 101 F, purulent drainage from wound, increased redness or drainage from wound, foul odor from the wound/dressing, or calf pain - CONTACT YOUR SURGEON.                                                   FOLLOW-UP APPOINTMENTS:  If you do not already have a post-op appointment, please call the office for an appointment to be seen by your surgeon.  Guidelines for how soon to be seen are listed in your After Visit Summary, but are typically between 1-4 weeks after surgery.  OTHER INSTRUCTIONS:   Knee Replacement:  Do not place pillow under knee, focus on keeping the knee straight while resting. CPM instructions: 0-90 degrees, 2 hours in the morning, 2 hours in the afternoon, and 2 hours in the evening. Place foam block, curve side up under heel at all times except when in CPM or when walking.  DO NOT modify, tear, cut, or change the foam block in any way.  MAKE SURE YOU:   Understand these instructions.   Get help right away if you are not doing well or get worse.    Thank you for letting us be a part of your medical care team.  It is a privilege we respect greatly.  We hope these instructions will help you stay on track for a fast and full recovery!

## 2015-12-14 NOTE — Op Note (Signed)
12/14/2015  10:41 AM  PATIENT:  Jackie Parker  75 y.o. female  MRN: [id number]  OPERATIVE REPORT   PRE-OPERATIVE DIAGNOSIS:  Osteoarthritis left knee  POST-OPERATIVE DIAGNOSIS:  Osteoarthritis left knee  PROCEDURE:  Procedure(s): COMPUTER ASSISTED LEFT TOTAL KNEE ARTHROPLASTY    SURGEON: Jessy Oto, MD     ASSISTANT:  Benjiman Core, PA-C  (Present throughout the entire procedure and necessary for completion of procedure in a timely manner)     ANESTHESIA:  General with supplemental adductor canal block supplemented with local marcaine 0.5% 1:1 exparel 1.3% total 30cc, Tom Meuller, CRNA     COMPLICATIONS:  None.     COMPONENTS:  DePuy Sigma cemented total knee system.  A #3  femoral component.  A #2.5 tibial tray with a 58m polyethylene RP tibial spacer , a 35 mm polyethylene patella.  Implant Name Type Inv. Item Serial No. Manufacturer Lot No. LRB No. Used  CEMENT HV SMART SET - [id number] CEMENT HV SMART SET  DEPUY [id number] 2  PLATE ROT INSERT 146.2MMSIZE 3 - [id number] PLATE ROT INSERT 179.0XYSIZE 3  DEPUY [id number] 1  PATELLA DOME PFC 35MM - [id number] PATELLA DOME PFC 35MM  DEPUY [id number] 1  FEMUR CMT SIGMA LFT SZ 3 - [id number] Implant FEMUR CMT SIGMA LFT SZ 3  DEPUY [id number] 1  TIBIA MBT CEMENT - [id number] TIBIA MBT CEMENT  DEPUY [id number] 1  INSERT PFC SIG STB SZ3 15.0MM - [id number] INSERT PFC SIG STB SZ3 15.0MM   DEPUY [id number] 1       PROCEDURE:The patient was met in the holding area, and the appropriate left knee identified and marked with "X" and my initials. The patient received a preoperative femoral nerve block by anesthesia.  The patient was then transported to OR and was placed on the operative table in a supine position. The patient was then placed under general anesthesia without difficulty. The patient received appropriate preoperative antibiotic prophylaxis. The patien'ts left knee was examined under  anesthesia and shown to have 8-110 range of motion,varus deformity, ligamentously stable, and normal patella tracking. Nursing staff inserted a Foley catheter under sterile conditions.  Tourniquet was applied to the operative thigh. Leg was then prepped using sterile conditions and draped using sterile technique. Time-out procedure was called and correct left knee identified.  The leg was elevated and Esmarch exsanguinated with a thigh  tourniquet elevated ot 320 mmHg.  Initially, through a 15-cm longitudinal incision based over the patella initial exposure was made. The underlying subcutaneous tissues were incised along with the skin incision. A median arthrotomy was performed revealing an excessive amount of normal-appearing joint fluid, The articular surfaces were inspected. The patient had grade 4 changes medially, grade 4 changes laterally, and grade 4 changes in the patellofemoral joint. Osteophytes were removed from the femoral condyles and the tibial plateau. The medial and lateral meniscal remnants were removed as well as the anterior cruciate ligament.  Pins were then placed in the anterior medial distal femur using the insertion guide and the femoral array placed. 2 pins were placed in the anterior lateral aspect of the mid tibia using the insertion guide through stab incisions. Tibial array was then placed. The computer infrared collector was then carefully aligned and the knee placed through a range of motion. The arrays were then carefully fixed in position and alignment. The hip axis of rotation determined without difficulty. Medial and lateral epicondyle of the ankle  determined. Then the anterior cruciate ligament insertion point and points over the medial, lateral, anterior and posterior proximal tibia. The degree of rotation determined and then points collected for the medial and lateral tibial plateau surfaces. Attention then turned to the femur where collection points were obtained from the  anterior distal femur the point just anterior to the femoral notch medial and lateral epicondyles and then painting of the anterior distal femur and medial and femoral condyles distally and posteriorly. Whitesides line was also determined. Anterior bow of the femur measured at 0. Based on this then the amount of proximal tibial cut was determined to be in line with off the depressed side of the joint. Using the proximal tibial cutting jig with the verification guidepin the jig was pinned to the proximal tibia within a millimeter and 1 of expected. 3 pins were used. Proximal tibial cut was then performed and verified. Soft tissue tensioning then examined and found to be well balanced in both flexion and extension. #3 femoral component chosen. The trial placed against the end of the femur and felt to be a good fit. Distal femur cutting jig was then carefully positioned on the distal femur using the verification the guide to within one degree and one millimeter of expected. 3 pins used in the jig in place and the distal transverse cut performed carefully protecting the soft tissue. Rotation guide was then attached after verification of the extension block at about 12.5 mm was again excellent tissue tensioning. Rotation guide was then attached and correct the alignment 2 pins placed over the distal cut surface of the femur for placement of the jig for the chamfer cuts and coronal cuts. This is a carefully placed and held in place with clamps. Protecting soft tissues then the anterior cut was made after first verifying with the wing depth of the cut. Then the posterior coronal cuts protect soft tissue structures posteriorly. Posterior chamfer cut and anterior chamfer cut. These were done without difficulty. Cutting guide removed and box cutting jig was then applied to the distal femur carefully aligned and pinned into place. Box cut was then performed from the distal femur intercondylar box. Proximal tibia was then  subluxed the posterior cruciate resected. A #2.5 plate and attached to the transverse cut surface of the proximal tibia using 2 pins. The upright for the reamer was then applied and reaming carried out for the keel for the tibial component. Using the osteotome was then inserted and impacted into place the temporary fixation pins removed from the proximal tibial plate. Femoral component was inserted using a trial component. Then a 12.5 mm insert placed and the knee brought into full extension full extension to 0 and 1.5 hyperextension was possible full flexion 135 with minimal lift off. The components were accepted and the permanent #3 femoral and #2.5 tibial components chosen and brought onto the field. Patella was observed to be a width anterior to posterior 24 mm and the residual 15 mm was allowed for the patella component. The cutting jig was then adjusted to allow for 15 mm remaining width. Applied and then the coronal cut the posterior aspect of the patella was performed. Lateral facet of patella showed very little resection. The trial 35 mm patella was chosen based on the size of the cut surface of patella. The 35 mm drill plate applied and drill holes placed through the posterior aspect of the patella. Trial reduction with the 35 mm trial and the leg placed through range of motion  showed excellent motion full extension and flexion 135 patella tendency to elevate medially was clamped in place over the medial retinaculum patellar showed normal appearance with flexion extension. No shuck noted then the knee was stable to varus and valgus at 0 30 and 60. Expose were then removed and the knee was then irrigated with copious amounts of. Solution. A 35 mm permanent patella prosthesis also brought to the field. Cement was mixed the knee was subluxed and carefully soft tissues protected note that the lug holes in the distal femur where drill using the trial prosthesis and with the drill guide provided. With  irrigation completed and permanent tibial components were then inserted into place using cement and a semi-putty state over the proximal aspect of the tibia cement was also applied to the deep surface of the tibial component as well as over the posterior runners of the femur and the deep surface of the patella component. These were then carefully place excess cement removed about the circumference of the tibial tray the femur was then placed applying cement to the cut it distal aspect of the femur and posterior chamfer area anterior chamfer and anterior coronal cut surface small amount within the box. Femoral component was then impacted into place excess cement removed amounts of circumference including the posterior chamfer area and the posterior femoral condyle area. The trial tibial peg then placed in the tibial component and a 12.5 mm rotating platform trial was inserted knee brought into full extension observed on the appeared to be in 0 of valgus with full extension 1 of hyperextension. Cement was then applied to the posterior cut surface of the patella within each of the patella peg opening was then pin holes were placed for the cementing along the lateral aspect of the patella. Patella component was then carefully aligned and inserted over the posterior aspect of the patella and clamped in place excess cement was then resected circumferentially using scalpel in order for her to preserve cement the lateral facet area. Cement was allowed to fully harden tourniquet was released while cement was nearly completely hardened. Following this then irrigation was carried down careful inspection demonstrated some small bleeders present over the lateral posterior aspect of the knee joint cauterized using electrocautery off to the medial aspect and the areas of geniculate arteries. There is no active bleeding present then at that time. 12.5 mm tibial insert demonstrated instability of the anterior drawer and  the knee  stable to varus valgus stress and 30 and 60 flexion and full extension. A 15 mm rotating platform insert was then chosen this was applied to the tibia using the permanent implant removing the trial examining tibia and determined there was no evident residual cement remaining. Also examined posterior runners I posterior aspect of the femoral condyles for any residual cement none was present. The tibial insert in place the reduced placed through range of motion and full extension flexion and 30 knee stable to varus stress at 0 30 and 60 anterior drawer minimally positive. This component was accepted. The synovium reapproximated over the medial aspect of the knee with 0 Vicryl sutures.  The retinaculum of the knee and the quadriceps tendon were then reapproximated with interrupted #1 Vicryl sutures. Peritenon of the reapproximated with interrupted 0 Vicryl sutures deep subcutaneous layers approximated with interrupted 0 and 2-0 Vicryl sutures and the skin closed with a running subcutaneous stitch of 4-0 Vicryl. Dermabond was then applied 30 cc of marcaine 0.5% 1:1 exparel 1.3% infiltrating the incision  and instilled into the joint. Adaptic 4 x 4's ABDs pads fixed to the skin with sterile webril an Ace wrap applied from the foot to the right upper thigh and knee immobilizer. All instrument and sponge counts were correct. Then reactivated extubated and returned to recovery room in satisfactory condition.  Benjiman Core, PA-C perform the duties of assistant surgeon she performed careful retraction of soft tissues assisted in addition the patient had removal on the OR table is present beginning the case the case and performed closure of the incision from the peritenon to the skin and application of dressing.    NITKA,JAMES E 12/14/2015, 10:41 AM

## 2015-12-14 NOTE — Progress Notes (Signed)
Orthopedic Tech Progress Note Patient Details:  Jackie Parker [birth year] [id number]  CPM Left Knee CPM Left Knee: On Left Knee Flexion (Degrees): 60 Left Knee Extension (Degrees): 0 Additional Comments: on at [name] 12/14/2015, 6:25 PM

## 2015-12-14 NOTE — Anesthesia Preprocedure Evaluation (Addendum)
Anesthesia Evaluation  Patient identified by MRN, date of birth, ID band Patient awake    Reviewed: Allergy & Precautions, NPO status , Patient's Chart, lab work & pertinent test results  Airway Mallampati: II  TM Distance: >3 FB Neck ROM: full    Dental  (+) Edentulous Upper, Edentulous Lower, Dental Advisory Given   Pulmonary asthma ,    breath sounds clear to auscultation       Cardiovascular hypertension, Pt. on medications  Rhythm:regular Rate:Normal     Neuro/Psych    GI/Hepatic PUD,   Endo/Other  Morbid obesity  Renal/GU Renal InsufficiencyRenal disease     Musculoskeletal  (+) Arthritis , Osteoarthritis,    Abdominal   Peds  Hematology   Anesthesia Other Findings   Reproductive/Obstetrics                           Anesthesia Physical Anesthesia Plan  ASA: II  Anesthesia Plan: General and Regional   Post-op Pain Management:  Regional for Post-op pain   Induction: Intravenous  Airway Management Planned: Oral ETT  Additional Equipment:   Intra-op Plan:   Post-operative Plan: Extubation in OR  Informed Consent: I have reviewed the patients History and Physical, chart, labs and discussed the procedure including the risks, benefits and alternatives for the proposed anesthesia with the patient or authorized representative who has indicated his/her understanding and acceptance.   Dental advisory given  Plan Discussed with: CRNA, Anesthesiologist and Surgeon  Anesthesia Plan Comments:        Anesthesia Quick Evaluation

## 2015-12-14 NOTE — Anesthesia Procedure Notes (Addendum)
Anesthesia Regional Block:  Adductor canal block  Pre-Anesthetic Checklist: ,, timeout performed, Correct Patient, Correct Site, Correct Laterality, Correct Procedure, Correct Position, site marked, Risks and benefits discussed,  Surgical consent,  Pre-op evaluation,  At surgeon's request and post-op pain management  Laterality: Left  Prep: chloraprep       Needles:  Injection technique: Single-shot  Needle Type: Echogenic Needle     Needle Length: 9cm 9 cm Needle Gauge: 21 and 21 G    Additional Needles:  Procedures: ultrasound guided (picture in chart) Adductor canal block Narrative:  Start time: 12/14/2015 7:02 AM End time: 12/14/2015 7:13 AM Injection made incrementally with aspirations every 5 mL.  Performed by: Personally  Anesthesiologist: HODIERNE, ADAM  Additional Notes: Pt tolerated the procedure well.   Procedure Name: Intubation Date/Time: 12/14/2015 7:38 AM Performed by: Carney Living Pre-anesthesia Checklist: Patient identified, Emergency Drugs available, Suction available and Patient being monitored Patient Re-evaluated:Patient Re-evaluated prior to inductionOxygen Delivery Method: Circle system utilized Preoxygenation: Pre-oxygenation with 100% oxygen Intubation Type: IV induction Ventilation: Mask ventilation without difficulty and Oral airway inserted - appropriate to patient size Laryngoscope Size: Sabra Heck and 2 Grade View: Grade I Tube type: Oral Tube size: 7.0 mm Number of attempts: 1 Airway Equipment and Method: Stylet Placement Confirmation: ETT inserted through vocal cords under direct vision,  positive ETCO2 and breath sounds checked- equal and bilateral Secured at: 22 cm Tube secured with: Tape Dental Injury: Teeth and Oropharynx as per pre-operative assessment  Comments: Intubation performed by Eston Esters, SRNA

## 2015-12-14 NOTE — Transfer of Care (Signed)
Immediate Anesthesia Transfer of Care Note  Patient: Jackie Parker  Procedure(s) Performed: Procedure(s): COMPUTER ASSISTED LEFT TOTAL KNEE ARTHROPLASTY (Left)  Patient Location: PACU  Anesthesia Type:GA combined with regional for post-op pain  Level of Consciousness: awake, alert , oriented and patient cooperative  Airway & Oxygen Therapy: Patient Spontanous Breathing and Patient connected to nasal cannula oxygen  Post-op Assessment: Report given to RN, Post -op Vital signs reviewed and stable and Patient moving all extremities X 4  Post vital signs: Reviewed and stable  Last Vitals:  Filed Vitals:   12/14/15 0610  BP: 163/86  Pulse: 112  Temp: 36.9 C  Resp: 20    Complications: No apparent anesthesia complications

## 2015-12-14 NOTE — H&P (Signed)
TOTAL KNEE ADMISSION H&P  Patient is being admitted for left total knee arthroplasty.  Subjective:  Chief Complaint:left knee pain.  HPI: Jackie Parker, 75 y.o. female, has a history of pain and functional disability in the left knee due to arthritis and has failed non-surgical conservative treatments for greater than 12 weeks to includeNSAID's and/or analgesics, corticosteriod injections, use of assistive devices and activity modification.  Onset of symptoms was gradual, starting 10 years ago with gradually worsening course since that time. The patient noted no past surgery on the left knee(s).  Patient currently rates pain in the left knee(s) at 10 out of 10 with activity. Patient has night pain, worsening of pain with activity and weight bearing, pain that interferes with activities of daily living, pain with passive range of motion, crepitus and joint swelling.  Patient has evidence of subchondral sclerosis, periarticular osteophytes and joint space narrowing by imaging studies. . There is no active infection.  Patient Active Problem List   Diagnosis Date Noted  . Small bowel obstruction, partial ([place]) 08/30/2013  . Acute renal failure ([place]) 08/30/2013  . 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 N&V (nausea and vomiting) 08/30/2013  . Abdominal pain, acute 08/30/2013  . SBO (small bowel obstruction) ([place]) 08/30/2013  . Hypokalemia 08/30/2013  . Partial small bowel obstruction ([place]) 08/30/2013  . Postmenopausal bleeding 03/18/2013  . OSTEOARTHRITIS, KNEE, LEFT 03/20/2010  . TRICUSPID REGURGITATION, MILD 03/03/2010  . RENAL INSUFFICIENCY 03/02/2010  . MORBID OBESITY 02/18/2010  . PREMATURE ATRIAL CONTRACTIONS 02/18/2010  . PREMATURE VENTRICULAR CONTRACTIONS 02/18/2010  . CARDIAC MURMUR 02/18/2010  . EDEMA 10/20/2009  . HYPOKALEMIA 07/30/2009  . RIB PAIN, RIGHT SIDED 05/26/2008  . ABDOMINAL PAIN, LEFT LOWER QUADRANT 12/31/2007  . ALLERGIC RHINITIS 06/25/2007  . ABNORMAL BLOOD  CHEMISTRY , OTHER 06/25/2007  . UNSPECIFIED VITAMIN D DEFICIENCY 04/30/2007  . HYPOCALCEMIA 04/19/2007  . GLAUCOMA NEC 04/19/2007  . HYPERTENSION, ESSENTIAL NOS 04/19/2007  . ASTHMA 04/19/2007  . COLITIS, ULCERATIVE NOS 04/19/2007  . DISORDER, MENSTRUAL NEC 04/19/2007  . PSORIASIS 04/19/2007  . 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 GOUT NOS 02/21/2006   Past Medical History  Diagnosis Date  . Asthma   . Hypertension   . Bronchitis   . Colitis, ulcerative ([place])   . Cataracts, bilateral   . Pollen allergies   . DJD (degenerative joint disease)     Past Surgical History  Procedure Laterality Date  . Colostomy    . Ileostomy    . Back surgery    . Tubal ligation      Prescriptions prior to admission  Medication Sig Dispense Refill Last Dose  . amLODipine (NORVASC) 5 MG tablet Take 5 mg by mouth daily.   12/14/2015 at Unknown time  . bimatoprost (LUMIGAN) 0.03 % ophthalmic solution Place 1 drop into both eyes 2 (two) times daily.   12/13/2015 at Unknown time  . brinzolamide (AZOPT) 1 % ophthalmic suspension Place 1 drop into the right eye 2 (two) times daily.   12/14/2015 at Unknown time  . cetirizine (ZYRTEC) 10 MG chewable tablet Chew 10 mg by mouth daily.   Past Week at Unknown time  . Cholecalciferol (VITAMIN D PO) Take 1 tablet by mouth daily.   Past Week at Unknown time  . Fluticasone-Salmeterol (ADVAIR) 250-50 MCG/DOSE AEPB Inhale 1 puff into the lungs every 12 (twelve) hours.   12/14/2015 at Unknown time  . montelukast (SINGULAIR) 10 MG tablet Take 10 mg by mouth at bedtime.   Past  Week at Unknown time  . traMADol (ULTRAM) 50 MG tablet Take by mouth every 6 (six) hours as needed.   Taking   Allergies  Allergen Reactions  . Sulfonamide Derivatives Rash    Social History  Substance Use Topics  . Smoking status: Never Smoker   . Smokeless tobacco: Never Used  . Alcohol Use: No    Family History  Problem Relation Age of Onset  . Hypertension Sister   . Glaucoma  Sister      Review of Systems  Constitutional: Negative.   HENT: Negative.   Eyes: Negative.   Respiratory: Negative.   Cardiovascular: Negative.   Gastrointestinal: Negative.   Genitourinary: Negative.   Musculoskeletal: Positive for joint pain.  Skin: Negative.   Neurological: Negative.   Psychiatric/Behavioral: Negative.     Objective:  Physical Exam  Constitutional: She is oriented to person, place, and time. No distress.  HENT:  Head: Atraumatic.  Eyes: EOM are normal.  Neck: Normal range of motion.  Cardiovascular: Normal rate.   Respiratory: Effort normal. No respiratory distress.  GI: She exhibits no distension.  Musculoskeletal: She exhibits tenderness.  Neurological: She is alert and oriented to person, place, and time.  Skin: Skin is warm and dry.  Psychiatric: She has a normal mood and affect.    Vital signs in last 24 hours: Temp:  [98.4 F (36.9 C)] 98.4 F (36.9 C) (04/03 0610) Pulse Rate:  [112] 112 (04/03 0610) Resp:  [20] 20 (04/03 0610) BP: (163)/(86) 163/86 mmHg (04/03 0610) SpO2:  [98 %] 98 % (04/03 0610) Weight:  [99.338 kg (219 lb)] 99.338 kg (219 lb) (04/03 0610)  Labs:   Estimated body mass index is 40.05 kg/(m^2) as calculated from the following:   Height as of 12/04/15: 5' 2"  (1.575 m).   Weight as of this encounter: 99.338 kg (219 lb).   Imaging Review Plain radiographs demonstrate moderate degenerative joint disease of the left knee(s). The overall alignment ismild varus. The bone quality appears to be good for age and reported activity level.  Assessment/Plan:  End stage arthritis, left knee   The patient history, physical examination, clinical judgment of the provider and imaging studies are consistent with end stage degenerative joint disease of the left knee(s) and total knee arthroplasty is deemed medically necessary. The treatment options including medical management, injection therapy arthroscopy and arthroplasty were  discussed at length. The risks and benefits of total knee arthroplasty were presented and reviewed. The risks due to aseptic loosening, infection, stiffness, patella tracking problems, thromboembolic complications and other imponderables were discussed. The patient acknowledged the explanation, agreed to proceed with the plan and consent was signed. Patient is being admitted for inpatient treatment for surgery, pain control, PT, OT, prophylactic antibiotics, VTE prophylaxis, progressive ambulation and ADL's and discharge planning. The patient is planning to be discharged home with home health services  Patient examined and lab reviewed with Ricard Dillon, PA-C.

## 2015-12-14 NOTE — Brief Op Note (Signed)
PATIENT ID:      Janita Camberos  MRN:     [id number] DOB/AGE:    1940/11/03 / 75 y.o.       OPERATIVE REPORT   DATE OF PROCEDURE:  12/14/2015      PREOPERATIVE DIAGNOSIS:   Osteoarthritis left knee                                                       Body mass index is 40.05 kg/(m^2).    POSTOPERATIVE DIAGNOSIS:   Osteoarthritis left knee                                                                     Body mass index is 40.05 kg/(m^2).    PROCEDURE:  Procedure(s): COMPUTER ASSISTED LEFT TOTAL KNEE ARTHROPLASTY USING CEMENTED DEPUY SIGMA RTP    SURGEON: Verta Riedlinger E   ASSISTANT: Esaw Grandchild          ANESTHESIA:  General and supplemented with local marcaine 0.5% 1:1 exparel 1.3% total 30cc, Tom Meuller CRNA  EBL:150cc  DRAINS:Foley to SD  TOURNIQUET TIME:110 min @ 388 mmHg  COMPLICATIONS:  None   CONDITION:  Stable, extubated in satisfactory condition.    Aayliah Rotenberry E 12/14/2015, 10:37 AM

## 2015-12-14 NOTE — Anesthesia Postprocedure Evaluation (Signed)
Anesthesia Post Note  Patient: Jackie Parker  Procedure(s) Performed: Procedure(s) (LRB): COMPUTER ASSISTED LEFT TOTAL KNEE ARTHROPLASTY (Left)  Patient location during evaluation: PACU Anesthesia Type: General Level of consciousness: awake and alert and patient cooperative Pain management: pain level controlled Vital Signs Assessment: post-procedure vital signs reviewed and stable Respiratory status: spontaneous breathing and respiratory function stable Cardiovascular status: stable Anesthetic complications: no    Last Vitals:  Filed Vitals:   12/14/15 1130 12/14/15 1145  BP: 174/92 132/91  Pulse: 103 87  Temp:    Resp: 18 17    Last Pain:  Filed Vitals:   12/14/15 1147  PainSc: [place]

## 2015-12-15 ENCOUNTER — Encounter (HOSPITAL_COMMUNITY): Payer: Self-pay | Admitting: Specialist

## 2015-12-15 LAB — CBC
HCT: 38.7 % (ref 36.0–46.0)
Hemoglobin: 12.3 g/dL (ref 12.0–15.0)
MCH: 30.3 pg (ref 26.0–34.0)
MCHC: 31.8 g/dL (ref 30.0–36.0)
MCV: 95.3 fL (ref 78.0–100.0)
Platelets: 172 10*3/uL (ref 150–400)
RBC: 4.06 MIL/uL (ref 3.87–5.11)
RDW: 14.4 % (ref 11.5–15.5)
WBC: 11.9 10*3/uL — ABNORMAL HIGH (ref 4.0–10.5)

## 2015-12-15 NOTE — Evaluation (Addendum)
Physical Therapy Evaluation Patient Details Name: Jackie Parker MRN: [id number] DOB: 03-03-1941 Today's Date: 12/15/2015   History of Present Illness  75 y.o. female admitted to [facility] on 12/14/15 for elective L TKA.  Pt with significant PMHx of HTN, psoriasis, colostomy, ileostomy, and back surgery.  Clinical Impression  Pt is POD #1 and is moving well min to min guard assist with RW. She was able to progress gait into the hallway and HEP was initiated.  Pt will likely progress well enough to d/c home with HHPT and family assist at f/u.   PT to follow acutely for deficits listed below.       Follow Up Recommendations Home health PT;Supervision for mobility/OOB    Equipment Recommendations  None recommended by PT    Recommendations for Other Services   NA    Precautions / Restrictions Precautions Precautions: Knee;Fall Required Braces or Orthoses: Knee Immobilizer - Left Knee Immobilizer - Left: Discontinue once straight leg raise with < 10 degree lag Restrictions Weight Bearing Restrictions: Yes LLE Weight Bearing: Weight bearing as tolerated      Mobility  Bed Mobility     General bed mobility comments: Pt already OOB in chair  Transfers Overall transfer level: Needs assistance Equipment used: Rolling walker (2 wheeled) Transfers: Sit to/from Stand Sit to Stand: Min assist         General transfer comment: Min assist to support trunk during transitions. Verbal cues for safe hand placement.   Ambulation/Gait Ambulation/Gait assistance: Min guard Ambulation Distance (Feet): 85 Feet Assistive device: Rolling walker (2 wheeled) Gait Pattern/deviations: Step-through pattern;Antalgic Gait velocity: decreased Gait velocity interpretation: Below normal speed for age/gender General Gait Details: Pt with moderately antalgic gait pattern with verbal cues for upright posture.  Assist needed for balance as pt fatigued         Balance Overall balance assessment: Needs  assistance Sitting-balance support: Feet supported;No upper extremity supported Sitting balance-Leahy Scale: Good     Standing balance support: Bilateral upper extremity supported;No upper extremity supported;Single extremity supported Standing balance-Leahy Scale: Fair                               Pertinent Vitals/Pain Pain Assessment: 0-10 Pain Score: 5  Pain Location: left knee Pain Descriptors / Indicators: Aching;Burning Pain Intervention(s): Limited activity within patient's tolerance;Monitored during session;Repositioned    Home Living Family/patient expects to be discharged to:: Private residence Living Arrangements: Children Available Help at Discharge: Family;Available 24 hours/day Type of Home: House Home Access: Stairs to enter Entrance Stairs-Rails: Left Entrance Stairs-Number of Steps: 3 Home Layout: One level Home Equipment: Walker - 2 wheels      Prior Function Level of Independence: Independent         Comments: "hobbling around"     Hand Dominance   Dominant Hand: Right    Extremity/Trunk Assessment   Upper Extremity Assessment: Defer to OT evaluation           Lower Extremity Assessment: LLE deficits/detail   LLE Deficits / Details: left leg with normal post op pain and weakness.  Pt with at least 3/5 ankle, 2/5 knee, and 2/5 hip flexion  Cervical / Trunk Assessment: Other exceptions  Communication   Communication: No difficulties  Cognition Arousal/Alertness: Awake/alert Behavior During Therapy: WFL for tasks assessed/performed Overall Cognitive Status: Within Functional Limits for tasks assessed  Exercises Total Joint Exercises Ankle Circles/Pumps: AROM;Both;10 reps Quad Sets: AROM;Left;10 reps Towel Squeeze: AROM;Both;10 reps Heel Slides: AAROM;Left;10 reps      Assessment/Plan    PT Assessment Patient needs continued PT services  PT Diagnosis Difficulty walking;Abnormality  of gait;Generalized weakness;Acute pain   PT Problem List Decreased strength;Decreased range of motion;Decreased activity tolerance;Decreased balance;Decreased mobility;Decreased knowledge of use of DME;Pain  PT Treatment Interventions DME instruction;Gait training;Stair training;Functional mobility training;Therapeutic activities;Therapeutic exercise;Balance training;Neuromuscular re-education;Patient/family education;Manual techniques;Modalities   PT Goals (Current goals can be found in the Care Plan section) Acute Rehab PT Goals Patient Stated Goal: to go home for her therapy PT Goal Formulation: With patient Time For Goal Achievement: 12/22/15 Potential to Achieve Goals: Good    Frequency 7X/week           End of Session Equipment Utilized During Treatment: Gait belt Activity Tolerance: Patient limited by fatigue;Patient limited by pain Patient left: in chair;with call bell/phone within reach           Time: 1040-1102 PT Time Calculation (min) (ACUTE ONLY): 22 min   Charges:   PT Evaluation $PT Eval Moderate Complexity: 1 Procedure          Kalkidan Caudell B. [place], [place], DPT [phone]   12/15/2015, 11:51 AM

## 2015-12-15 NOTE — Progress Notes (Signed)
Physical Therapy Treatment Patient Details Name: Jackie Parker MRN: [id number] DOB: July 06, 1941 Today's Date: 12/15/2015    History of Present Illness 75 y.o. female admitted to [facility] on 12/14/15 for elective L TKA.  Pt with significant PMHx of HTN, psoriasis, colostomy, ileostomy, and back surgery.    PT Comments    Pt is POD #1 and this is her second session.  She is progressing well with gait and mobility and was able to walk further down the hallway this session with a more normal gait pattern.  She is continuing to progress exercises as well.  PT to follow acutely until d/c confirmed.     Follow Up Recommendations  Home health PT;Supervision for mobility/OOB     Equipment Recommendations  None recommended by PT    Recommendations for Other Services   NA     Precautions / Restrictions Precautions Precautions: Knee;Fall Precaution Booklet Issued: Yes (comment) Precaution Comments: knee handout given and knee precaution reviewed Required Braces or Orthoses: Knee Immobilizer - Left Knee Immobilizer - Left: Discontinue once straight leg raise with < 10 degree lag Restrictions Weight Bearing Restrictions: Yes LLE Weight Bearing: Weight bearing as tolerated    Mobility  Bed Mobility Overal bed mobility: Needs Assistance Bed Mobility: Supine to Sit;Sit to Supine     Supine to sit: Min assist Sit to supine: Min assist   General bed mobility comments: Min assist to help progress left leg to EOB and to help lift left leg back into bed to get to supine.  Transfers Overall transfer level: Needs assistance Equipment used: Rolling walker (2 wheeled) Transfers: Sit to/from Stand Sit to Stand: Min assist         General transfer comment: Min assist to support trunk during transitions. Verbal cues for safe hand placement.   Ambulation/Gait Ambulation/Gait assistance: Min guard Ambulation Distance (Feet): 90 Feet Assistive device: Rolling walker (2 wheeled) Gait  Pattern/deviations: Step-through pattern;Antalgic Gait velocity: decreased Gait velocity interpretation: Below normal speed for age/gender General Gait Details: Pt with less antalgic gait pattern this PM, less cues for posture.  Better speed          Balance Overall balance assessment: Needs assistance Sitting-balance support: Feet supported;No upper extremity supported Sitting balance-Leahy Scale: Good     Standing balance support: No upper extremity supported;Bilateral upper extremity supported;Single extremity supported Standing balance-Leahy Scale: Fair                      Cognition Arousal/Alertness: Awake/alert Behavior During Therapy: WFL for tasks assessed/performed Overall Cognitive Status: Within Functional Limits for tasks assessed                      Exercises Total Joint Exercises Ankle Circles/Pumps: AROM;Both;10 reps Quad Sets: AROM;Left;10 reps Towel Squeeze: AROM;Both;10 reps Short Arc QuadSinclair Ship;Left;10 reps Heel Slides: AAROM;Left;10 reps Hip ABduction/ADduction: AROM;Left;10 reps Straight Leg Raises: AAROM;Left;10 reps        Pertinent Vitals/Pain Pain Assessment: 0-10 Pain Score: 4  Pain Location: left knee Pain Descriptors / Indicators: Aching;Burning;Grimacing;Guarding Pain Intervention(s): Limited activity within patient's tolerance;Monitored during session;Repositioned    Home Living Family/patient expects to be discharged to:: Private residence Living Arrangements: Children Available Help at Discharge: Family;Available 24 hours/day Type of Home: House Home Access: Stairs to enter Entrance Stairs-Rails: Left Home Layout: One level Home Equipment: Walker - 2 wheels      Prior Function Level of Independence: Independent      Comments: "hobbling around"  PT Goals (current goals can now be found in the care plan section) Acute Rehab PT Goals Patient Stated Goal: to go home for her therapy PT Goal Formulation: With  patient Time For Goal Achievement: 12/22/15 Potential to Achieve Goals: Good Progress towards PT goals: Progressing toward goals    Frequency  7X/week    PT Plan Current plan remains appropriate       End of Session Equipment Utilized During Treatment: Gait belt Activity Tolerance: Patient limited by fatigue;Patient limited by pain Patient left: with call bell/phone within reach;in bed;in CPM     Time: 1417-1440 PT Time Calculation (min) (ACUTE ONLY): 23 min  Charges:  $Gait Training: 8-22 mins $Therapeutic Exercise: 8-22 mins                      Brittan Mapel B. [place], [place], DPT [phone]   12/15/2015, 2:46 PM

## 2015-12-15 NOTE — Progress Notes (Signed)
Occupational Therapy Evaluation Patient Details Name: Ellison Leisure MRN: [id number] DOB: 1941/02/07 Today's Date: 12/15/2015    History of Present Illness s/p L TKA   Clinical Impression   PTA, pt independent with ADL and mobility. Pt making excellent progress. Ambulated to bathroom and completed ADL with min A. Pt's grandson's and daughter plan to assist as needed at D/C. Pt will need 3 in 1 for safe D/C. Will see pt in am to complete ADL education prior to anticipated D/C tomorrow if pt is able to negotiate stairs with PT.    Follow Up Recommendations  No OT follow up;Supervision/Assistance - 24 hour    Equipment Recommendations  3 in 1 bedside comode    Recommendations for Other Services       Precautions / Restrictions Precautions Precautions: Knee;Fall Required Braces or Orthoses: Knee Immobilizer - Left Knee Immobilizer - Left: Discontinue once straight leg raise with < 10 degree lag      Mobility Bed Mobility Overal bed mobility: Needs Assistance Bed Mobility: Supine to Sit     Supine to sit: Supervision;HOB elevated        Transfers Overall transfer level: Needs assistance Equipment used: Rolling walker (2 wheeled) Transfers: Sit to/from Stand Sit to Stand: Min assist         General transfer comment: vc for hand placement and technique    Balance Overall balance assessment: Needs assistance   Sitting balance-Leahy Scale: Good       Standing balance-Leahy Scale: Fair                              ADL Overall ADL's : Needs assistance/impaired     Grooming: Set up;Sitting   Upper Body Bathing: Set up;Sitting   Lower Body Bathing: Minimal assistance;Sit to/from stand   Upper Body Dressing : Set up;Sitting   Lower Body Dressing: Minimal assistance;Sit to/from stand   Toilet Transfer: Minimal assistance;BSC;Ambulation;RW Armed forces technical officer Details (indicate cue type and reason): vc for hand placement Toileting- Clothing  Manipulation and Hygiene: Set up;Sit to/from stand (manages own colostomy bag)       Functional mobility during ADLs: Minimal assistance;Rolling walker;Cueing for safety;Cueing for sequencing General ADL Comments: Began educating pt in compensatory techniques. PTA, pt only sponge bathed     Vision     Perception     Praxis      Pertinent Vitals/Pain Pain Assessment: 0-10 Pain Score: 4  Pain Location: L knee Pain Descriptors / Indicators: Aching;Burning Pain Intervention(s): Limited activity within patient's tolerance     Hand Dominance Right   Extremity/Trunk Assessment Upper Extremity Assessment Upper Extremity Assessment: Overall WFL for tasks assessed   Lower Extremity Assessment Lower Extremity Assessment: LLE deficits/detail   Cervical / Trunk Assessment Cervical / Trunk Assessment: Normal   Communication Communication Communication: No difficulties   Cognition Arousal/Alertness: Awake/alert Behavior During Therapy: WFL for tasks assessed/performed Overall Cognitive Status: Within Functional Limits for tasks assessed                     General Comments       Exercises       Shoulder Instructions      Home Living Family/patient expects to be discharged to:: Private residence   Available Help at Discharge: Family;Available 24 hours/day Type of Home: House Home Access: Stairs to enter [employer] of Steps: 3 Entrance Stairs-Rails: Left Home Layout: One level     Bathroom Shower/Tub: Tub/shower  unit Shower/tub characteristics: Door [employer]: Standard Bathroom Accessibility: Yes How Accessible: Accessible via walker Home Equipment: Walker - 2 wheels          Prior Functioning/Environment Level of Independence: Independent             OT Diagnosis: Generalized weakness;Acute pain   OT Problem List: Decreased strength;Decreased range of motion;Decreased activity tolerance;Decreased knowledge of use of DME or  AE;Decreased knowledge of precautions;Obesity;Pain   OT Treatment/Interventions: Self-care/ADL training;DME and/or AE instruction;Therapeutic activities;Patient/family education    OT Goals(Current goals can be found in the care plan section) Acute Rehab OT Goals Patient Stated Goal: to go home OT Goal Formulation: With patient Time For Goal Achievement: 12/22/15 Potential to Achieve Goals: Good ADL Goals Pt Will Perform Lower Body Bathing: with supervision;sit to/from stand;with adaptive equipment;with caregiver independent in assisting Pt Will Perform Lower Body Dressing: with supervision;with adaptive equipment;with caregiver independent in assisting;sit to/from stand Pt Will Transfer to Toilet: with supervision;ambulating;bedside commode (with caregiver independent in assisting)  OT Frequency: Min 2X/week   Barriers to D/C:            Co-evaluation              End of Session Equipment Utilized During Treatment: Gait belt;Rolling walker;Left knee immobilizer CPM Left Knee CPM Left Knee: Off Nurse Communication: Mobility status;Precautions;Weight bearing status;Patient requests pain meds  Activity Tolerance: Patient tolerated treatment well Patient left: in chair;with call bell/phone within reach   Time: 0924-0955 OT Time Calculation (min): 31 min Charges:  OT General Charges $OT Visit: 1 Procedure OT Evaluation $OT Eval Moderate Complexity: 1 Procedure OT Treatments $Self Care/Home Management : 8-22 mins G-Codes:    Jolyn Deshmukh,HILLARY 11-Jan-2016, 10:09 AM   Maurie Boettcher, OTR/L  [phone] 2016-01-11

## 2015-12-15 NOTE — Progress Notes (Signed)
Orthopedic Tech Progress Note Patient Details:  Jackie Parker [birth year] [id number]  Patient ID: Jackie Parker, female   DOB: 02/13/1941, 75 y.o.   MRN: [id number] Pt. stated hs already been in cpm twice today. Doesn't want to go back in.  [name] 12/15/2015, 7:59 PM

## 2015-12-15 NOTE — Progress Notes (Addendum)
Subjective: Doing well.  Pain controlled. Denies cp, sob.   Objective: Vital signs in last 24 hours: Temp:  [97.9 F (36.6 C)-99 F (37.2 C)] 99 F (37.2 C) (04/04 0300) Pulse Rate:  [78-110] 101 (04/04 0300) Resp:  [8-26] 14 (04/03 2218) BP: (104-181)/(63-92) 114/63 mmHg (04/04 0300) SpO2:  [93 %-100 %] 99 % (04/04 0300)  Intake/Output from previous day: 04/03 0701 - 04/04 0700 In: 2000 [I.V.:2000] Out: 925 [Urine:825; Blood:100] Intake/Output this shift:     Recent Labs  12/15/15 0518  HGB 12.3    Recent Labs  12/15/15 0518  WBC 11.9*  RBC 4.06  HCT 38.7  PLT 172   No results for input(s): NA, K, CL, CO2, BUN, CREATININE, GLUCOSE, CALCIUM in the last 72 hours. No results for input(s): LABPT, INR in the last 72 hours.  Exam:  Alert and oriented x 3.  Family members present.  Dressing c/d/i.  Calf nontender, NVI.    Assessment/Plan: Looks good this AM.  PT today.  D/c home with good progress.  Has a lot of family support.  Saline lock IV with good po's     OWENS,JAMES M 12/15/2015, 8:23 AM     Patient examined and lab reviewed with Ricard Dillon, PA-C.

## 2015-12-15 NOTE — Progress Notes (Signed)
Orthopedic Tech Progress Note Patient Details:  Jackie Parker 12/29/1940 [id number]  Patient ID: [name], female   DOB: [birth year], 75 y.o.   MRN: [id number] Talked to rn. She said pt. just got in bone foam and would not tolerate cpm. They will call when pt. Is ready for cpm.   [name] 12/15/2015, 6:13 AM

## 2015-12-16 NOTE — Progress Notes (Signed)
Orthopedic Tech Progress Note Patient Details:  Jackie Parker [birth year] [id number]  Patient ID: [name], female   DOB: 1941/04/26, 75 y.o.   MRN: [id number] Placed pt's lle on cpm @0 -50 degrees; will increase as pt tolerates; RN notified  Hildred Priest 12/16/2015, 1:38 PM

## 2015-12-16 NOTE — Progress Notes (Signed)
Subjective: Doing well.  Pain controlled.  No specific complaints.  Progressing with rehab.    Objective: Vital signs in last 24 hours: Temp:  [98.4 F (36.9 C)-98.9 F (37.2 C)] 98.9 F (37.2 C) (04/04 2102) Pulse Rate:  [108-110] 110 (04/04 2102) Resp:  [18] 18 (04/04 2102) BP: (121-133)/(57-84) 133/57 mmHg (04/04 2102) SpO2:  [98 %] 98 % (04/04 2102)  Intake/Output from previous day: 04/04 0701 - 04/05 0700 In: 840 [P.O.:840] Out: -  Intake/Output this shift:     Recent Labs  12/15/15 0518  HGB 12.3    Recent Labs  12/15/15 0518  WBC 11.9*  RBC 4.06  HCT 38.7  PLT 172   No results for input(s): NA, K, CL, CO2, BUN, CREATININE, GLUCOSE, CALCIUM in the last 72 hours. No results for input(s): LABPT, INR in the last 72 hours.  Exam:  Alert and oriented.  Wounds look good.  No drainage or signs of infections.  Calf nontender, NVI.    Assessment/Plan: Continue PT.  Anticipate d/c home tomorrow.     Kataleia Quaranta M 12/16/2015, 8:13 AM

## 2015-12-16 NOTE — Clinical Social Work Note (Signed)
CSW received referral for SNF.  Case discussed with case manager and plan is to discharge home.  CSW to sign off please re-consult if social work needs arise.  Jones Broom. [place], MSW, [employer]

## 2015-12-16 NOTE — Progress Notes (Addendum)
Physical Therapy Treatment Patient Details Name: Jackie Parker MRN: [id number] DOB: Dec 12, 1940 Today's Date: 12/16/2015    History of Present Illness 75 y.o. female admitted to [facility] on 12/14/15 for elective L TKA.  Pt with significant PMHx of HTN, psoriasis, colostomy, ileostomy, and back surgery.    PT Comments    Patient is progressing toward mobility goals. Patient needs to practice stairs next session. Current plan remains appropriate.  Follow Up Recommendations  Home health PT;Supervision for mobility/OOB     Equipment Recommendations  None recommended by PT    Recommendations for Other Services       Precautions / Restrictions Precautions Precautions: Knee;Fall Precaution Booklet Issued: Yes (comment) Precaution Comments: knee handout given and knee precaution reviewed Restrictions Weight Bearing Restrictions: Yes LLE Weight Bearing: Weight bearing as tolerated    Mobility  Bed Mobility               General bed mobility comments: pt received standing in room with OT  Transfers Overall transfer level: Needs assistance Equipment used: Rolling walker (2 wheeled) Transfers: Sit to/from Stand Sit to Stand: Min guard         General transfer comment: min guard for safety; cues for hand placement and to descend slowly  Ambulation/Gait Ambulation/Gait assistance: Supervision Ambulation Distance (Feet): 100 Feet Assistive device: Rolling walker (2 wheeled) Gait Pattern/deviations: Step-through pattern;Decreased stance time - left;Decreased step length - right;Decreased dorsiflexion - left;Antalgic Gait velocity: decreased   General Gait Details: slightly antalgic with improved as distance increased; cues for position of RW, R heel strike, and increased bilat step length   Stairs            Wheelchair Mobility    Modified Rankin (Stroke Patients Only)       Balance     Sitting balance-Leahy Scale: Good       Standing balance-Leahy Scale:  Fair                      Cognition Arousal/Alertness: Awake/alert Behavior During Therapy: WFL for tasks assessed/performed Overall Cognitive Status: Within Functional Limits for tasks assessed                      Exercises Total Joint Exercises Quad Sets: AROM;Both;15 reps;Seated Heel Slides: AROM;Left;15 reps;Seated Hip ABduction/ADduction: AROM;Left;15 reps;Seated Goniometric ROM: 0-70    General Comments        Pertinent Vitals/Pain Pain Assessment: 0-10 Pain Score: 6  Pain Location: L knee Pain Descriptors / Indicators: Sore Pain Intervention(s): Limited activity within patient's tolerance;Monitored during session;Repositioned;Patient requesting pain meds-RN notified;Ice applied    Home Living                      Prior Function            PT Goals (current goals can now be found in the care plan section) Acute Rehab PT Goals Patient Stated Goal: go home PT Goal Formulation: With patient Time For Goal Achievement: 12/22/15 Potential to Achieve Goals: Good Progress towards PT goals: Progressing toward goals    Frequency  7X/week    PT Plan Current plan remains appropriate    Co-evaluation             End of Session Equipment Utilized During Treatment: Gait belt Activity Tolerance: Patient tolerated treatment well Patient left: with call bell/phone within reach;in chair     Time: 0950-1010 PT Time Calculation (min) (ACUTE ONLY): 20 min  Charges:  $Gait Training: 8-22 mins                    G Codes:      [place] April, [place] Pager: [phone]   12/16/2015, 10:21 AM

## 2015-12-16 NOTE — Progress Notes (Signed)
Orthopedic Tech Progress Note Patient Details:  Jackie Parker 1941/01/23 [id number]  Patient ID: Jackie Parker, female   DOB: March 22, 1941, 75 y.o.   MRN: [id number] Applied cpm 0-50. This is where the pt. was comfortable in the cpm  Jackie Parker 12/16/2015, 6:16 AM

## 2015-12-16 NOTE — Progress Notes (Signed)
Physical Therapy Treatment Patient Details Name: Jackie Parker MRN: [id number] DOB: [birth year] Today's Date: 12/16/2015    History of Present Illness 75 y.o. female admitted to [facility] on 12/14/15 for elective L TKA.  Pt with significant PMHx of HTN, psoriasis, colostomy, ileostomy, and back surgery.    PT Comments    Patient continues to do well with PT. Stair training complete this session. Continue to progress as tolerated with anticipated d/c home with HHPT.   Follow Up Recommendations  Home health PT;Supervision for mobility/OOB     Equipment Recommendations  None recommended by PT    Recommendations for Other Services       Precautions / Restrictions Precautions Precautions: Knee;Fall Precaution Booklet Issued: Yes (comment) Precaution Comments: knee handout given and knee precaution reviewed Restrictions Weight Bearing Restrictions: Yes LLE Weight Bearing: Weight bearing as tolerated    Mobility  Bed Mobility Overal bed mobility: Needs Assistance Bed Mobility: Supine to Sit     Supine to sit: Min assist     General bed mobility comments: assist to scoot hips to EOB with cues for technique; use of bedrail   Transfers Overall transfer level: Needs assistance Equipment used: Rolling walker (2 wheeled) Transfers: Sit to/from Stand Sit to Stand: Min guard         General transfer comment: cues for hand placement  Ambulation/Gait Ambulation/Gait assistance: Supervision Ambulation Distance (Feet): 200 Feet Assistive device: Rolling walker (2 wheeled) Gait Pattern/deviations: Step-through pattern;Decreased stride length;Decreased stance time - left;Trunk flexed;Wide base of support Gait velocity: decreased   General Gait Details: pt with improved posture and symmetrical step lengths; cues for position of RW and bilat heel strike   Stairs Stairs: Yes Stairs assistance: Min guard Stair Management: Two rails;Forwards Number of Stairs: 2 General stair  comments: educated on sequencing and technique; min guard for safety; cues for engaging L quad before WS to L LE  Wheelchair Mobility    Modified Rankin (Stroke Patients Only)       Balance     Sitting balance-Leahy Scale: Good       Standing balance-Leahy Scale: Fair                      Cognition Arousal/Alertness: Awake/alert Behavior During Therapy: WFL for tasks assessed/performed Overall Cognitive Status: Within Functional Limits for tasks assessed                      Exercises      General Comments        Pertinent Vitals/Pain Pain Assessment: 0-10 Pain Score: 3  Pain Location: L knee Pain Descriptors / Indicators: Sore Pain Intervention(s): Limited activity within patient's tolerance;Monitored during session;Premedicated before session;Repositioned    Home Living                      Prior Function            PT Goals (current goals can now be found in the care plan section) Acute Rehab PT Goals Patient Stated Goal: go home PT Goal Formulation: With patient Time For Goal Achievement: 12/22/15 Potential to Achieve Goals: Good Progress towards PT goals: Progressing toward goals    Frequency  7X/week    PT Plan Current plan remains appropriate    Co-evaluation             End of Session Equipment Utilized During Treatment: Gait belt Activity Tolerance: Patient tolerated treatment well Patient left: with call bell/phone  within reach;in chair;with family/visitor present     Time: 1783-7542 PT Time Calculation (min) (ACUTE ONLY): 24 min  Charges:  $Gait Training: 8-22 mins $Therapeutic Activity: 8-22 mins                    G Codes:      Salina April, PTA Pager: [phone]   12/16/2015, 3:23 PM

## 2015-12-16 NOTE — Progress Notes (Signed)
Occupational Therapy Treatment Patient Details Name: Jackie Parker MRN: [id number] DOB: [birth year] Today's Date: 12/16/2015    History of present illness 75 y.o. female admitted to [facility] on 12/14/15 for elective L TKA.  Pt with significant PMHx of HTN, psoriasis, colostomy, ileostomy, and back surgery.   OT comments  All education completed. Pt more painful this am but doing well. OT goals met. OT signing off.  Follow Up Recommendations  No OT follow up;Supervision/Assistance - 24 hour    Equipment Recommendations  3 in 1 bedside comode    Recommendations for Other Services      Precautions / Restrictions Precautions Precautions: Knee;Fall Precaution Booklet Issued:  Precaution Comments:  Restrictions Weight Bearing Restrictions: Yes LLE Weight Bearing: Weight bearing as tolerated       Mobility Bed Mobility               General bed mobility comments: Pt OOB in chair  Transfers Overall transfer level: Needs assistance Equipment used: Rolling walker (2 wheeled) Transfers: Sit to/from Stand Sit to Stand: Supervision         General transfer comment: min guard for safety; cues for hand placement and to descend slowly    Balance     Sitting balance-Leahy Scale: Good       Standing balance-Leahy Scale: Fair                     ADL                                       Functional mobility during ADLs: Rolling walker;Supervision/safety General ADL Comments: Pt completed ADL session this am with set up. Educated on reducing risk of falls after D/C. Pt verbalized understanding.      Vision                     Perception     Praxis      Cognition   Behavior During Therapy: WFL for tasks assessed/performed Overall Cognitive Status: Within Functional Limits for tasks assessed                       Extremity/Trunk Assessment               Exercises   Shoulder Instructions       General Comments       Pertinent Vitals/ Pain       Pain Assessment: 0-10 Pain Score: 6  Pain Location: L knee Pain Descriptors / Indicators: Aching;Burning Pain Intervention(s): Limited activity within patient's tolerance  Home Living                                          Prior Functioning/Environment              Frequency       Progress Toward Goals  OT Goals(current goals can now be found in the care plan section)  Progress towards OT goals: Goals met/education completed, patient discharged from OT  Acute Rehab OT Goals Patient Stated Goal: go home OT Goal Formulation: All assessment and education complete, DC therapy ADL Goals Pt Will Perform Lower Body Bathing: with supervision;sit to/from stand;with adaptive equipment;with caregiver independent in assisting Pt Will Perform Lower Body Dressing: with supervision;with adaptive  equipment;with caregiver independent in assisting;sit to/from stand Pt Will Transfer to Toilet: with supervision;ambulating;bedside commode  Plan All goals met and education completed, patient discharged from OT services    Co-evaluation                 End of Session Equipment Utilized During Treatment: Gait belt;Rolling walker   Activity Tolerance Patient tolerated treatment well   Patient Left Other (comment) (with PT)   Nurse Communication Mobility status        Time: 9179-2178 OT Time Calculation (min): 15 min  Charges: OT General Charges $OT Visit: 1 Procedure OT Treatments $Self Care/Home Management : 8-22 mins  Mohd Clemons,HILLARY 12/16/2015, 11:19 AM   Maurie Boettcher, OTR/L  [phone] 12/16/2015

## 2015-12-16 NOTE — Care Management Note (Signed)
Case Management Note  Patient Details  Name: Jackie Parker MRN: [id number] Date of Birth: [birth year]  Subjective/Objective:         S/p left total knee arthroplasty           Action/Plan: Spoke with patient about discharge plan. She selected [facility] from [employer], agencies list. [facility] Slim[facility] at [facility] and set up HHPT. Patient stated that her daughter and grandson will be assisting her after discharge. [employer] is delivering CPM to home and [facility] providing rolling walker and 3N1.   Expected Discharge Date:                  Expected Discharge Plan:  [place]  In-House Referral:  NA  Discharge planning Services  CM Consult  Post Acute Care Choice:  Durable Medical Equipment, Home Health Choice offered to:  Patient  DME Arranged:  3-N-1, Walker rolling, CPM DME Agency:  [place] [employer]  HH Arranged:  PT HH Agency:  [facility]  Status of Service:  Completed, signed off  Medicare Important Message Given:    Date Medicare IM Given:    Medicare IM give by:    Date Additional Medicare IM Given:    Additional Medicare Important Message give by:     If discussed at [place] of Stay Meetings, dates discussed:    Additional Comments:  Nila Nephew, RN 12/16/2015, 11:07 AM

## 2015-12-16 NOTE — Progress Notes (Signed)
PT Cancellation Note  Patient Details Name: Jackie Parker MRN: [id number] DOB: Feb 15, 1941   Cancelled Treatment:    Reason Eval/Treat Not Completed: Patient declined, no reason specifiedPt reported she just got put in the CPM machine and "does not want to get out of it". PT will check on pt later as time allows.    Salina April, PTA Pager: [phone]   12/16/2015, 1:58 PM

## 2015-12-16 NOTE — Progress Notes (Signed)
Received report on pt.  Assuming care at this time.

## 2015-12-17 ENCOUNTER — Inpatient Hospital Stay (HOSPITAL_COMMUNITY): Payer: Medicare Other

## 2015-12-17 DIAGNOSIS — M25452 Effusion, left hip: Secondary | ICD-10-CM

## 2015-12-17 DIAGNOSIS — M7989 Other specified soft tissue disorders: Secondary | ICD-10-CM

## 2015-12-17 MED ORDER — DOCUSATE SODIUM 100 MG PO CAPS: 100.0000 mg | ORAL_CAPSULE | Freq: Two times a day (BID) | ORAL | Status: DC

## 2015-12-17 MED ORDER — TRAMADOL HCL 50 MG PO TABS: 50.0000 mg | ORAL_TABLET | Freq: Four times a day (QID) | ORAL | Status: DC | PRN

## 2015-12-17 MED ORDER — ASPIRIN 325 MG PO TBEC: 325.0000 mg | DELAYED_RELEASE_TABLET | Freq: Two times a day (BID) | ORAL | Status: DC

## 2015-12-17 MED ORDER — OXYCODONE HCL 5 MG PO TABS: 5.0000 mg | ORAL_TABLET | ORAL | Status: DC | PRN

## 2015-12-17 NOTE — Progress Notes (Signed)
VASCULAR LAB PRELIMINARY  PRELIMINARY  PRELIMINARY  PRELIMINARY  Bilateral lower extremity venous duplex completed.    Preliminary report:  There is no DVT or SVT noted in the bilateral lower extremities.   Raygen Dahm, RVT 12/17/2015, 10:26 AM

## 2015-12-17 NOTE — Care Management Important Message (Signed)
Important Message  Patient Details  Name: Jackie Parker MRN: [id number] Date of Birth: Nov 20, 1940   Medicare Important Message Given:  Yes    Barb Merino D[place]ield 12/17/2015, 12:48 PM

## 2015-12-17 NOTE — Progress Notes (Signed)
Orthopedic Tech Progress Note Patient Details:  Jackie Parker Mar 25, 1941 [id number]  CPM Left Knee CPM Left Knee: On Left Knee Flexion (Degrees): 50 Left Knee Extension (Degrees): 0 Additional Comments: on at [place] 12/17/2015, 2:07 PM

## 2015-12-17 NOTE — Progress Notes (Signed)
PT Cancellation Note  Patient Details Name: Aerionna Moravek MRN: [id number] DOB: Feb 01, 1941   Cancelled Treatment:    Reason Eval/Treat Not Completed: Patient at procedure or test/unavailable.  PT to check back later this AM.    Wells Guiles B. [place], [place], DPT [phone]   12/17/2015, 9:52 AM

## 2015-12-17 NOTE — Progress Notes (Signed)
PT Cancellation Note  Patient Details Name: Jackie Parker MRN: [id number] DOB: 1941-04-28   Cancelled Treatment:    Reason Eval/Treat Not Completed: Patient declined, reported she was "too painful to do therapy today, I will start tomorrow".  I educated pt that this was not a good idea to take a whole day off as she needed to walk and do exercises multiple times per day to have a successful outcome with her knee.  She was agreeable for PT to check back before lunch and before she discharges to, "do a little bit".  Thanks,   Barbarann Ehlers. [place], [place], DPT [phone]   12/17/2015, 10:38 AM

## 2015-12-17 NOTE — Progress Notes (Signed)
Orthopedic Tech Progress Note Patient Details:  Jackie Parker Feb 14, 1941 [id number]  Patient ID: [name], female   DOB: [birth year], 75 y.o.   MRN: [id number] Applied cpm 0-50  [name] 12/17/2015, 6:09 AM

## 2015-12-17 NOTE — Progress Notes (Signed)
     Subjective: 3 Days Post-Op Procedure(s) (LRB): COMPUTER ASSISTED LEFT TOTAL KNEE ARTHROPLASTY (Left) Awake, alert and oriented x 4. No complaints.   Patient reports pain as moderate.    Objective:   VITALS:  Temp:  [98.1 F (36.7 C)-98.2 F (36.8 C)] 98.2 F (36.8 C) (04/06 0617) Pulse Rate:  [87-108] 102 (04/06 0617) Resp:  [16-18] 16 (04/06 0617) BP: (94-156)/(46-78) 112/69 mmHg (04/06 0617) SpO2:  [96 %-98 %] 96 % (04/06 0617)  Neurologically intact ABD soft Neurovascular intact Sensation intact distally Intact pulses distally Dorsiflexion/Plantar flexion intact Incision: scant drainage Swelling of both the left thigh and calf, with Homan's sign positive.    LABS  Recent Labs  12/15/15 0518  HGB 12.3  WBC 11.9*  PLT 172   No results for input(s): NA, K, CL, CO2, BUN, CREATININE, GLUCOSE in the last 72 hours. No results for input(s): LABPT, INR in the last 72 hours.   Assessment/Plan: 3 Days Post-Op Procedure(s) (LRB): COMPUTER ASSISTED LEFT TOTAL KNEE ARTHROPLASTY (Left) Right leg swelling.  Up with therapy  Duplex venous doppler since there is swelling above and below level of left knee replacement surgery, r/o DVT. Will assess both legs. Rx will be written and plan to assess doppler study prior to discharge.Marland Kitchen  Jackie Parker E 12/17/2015, 8:21 AM

## 2015-12-17 NOTE — Progress Notes (Signed)
Physical Therapy Treatment Patient Details Name: Jackie Parker MRN: [id number] DOB: [birth year] Today's Date: 12/17/2015    History of Present Illness 75 y.o. female admitted to [facility] on 12/14/15 for elective L TKA.  Pt with significant PMHx of HTN, psoriasis, colostomy, ileostomy, and back surgery.    PT Comments    Pt was reluctant to work with PT today due to pain in left leg.  With much persuation and education on the importance of continuing to move and walk even when painful, pt participated.  She did not walk as far as yesterday, but did walk with supervision and we did the first page of her HEP.  Pt anticipates she will go home later today.   Follow Up Recommendations  Home health PT;Supervision for mobility/OOB     Equipment Recommendations  None recommended by PT    Recommendations for Other Services   NA     Precautions / Restrictions Precautions Precautions: Knee;Fall Precaution Booklet Issued: Yes (comment) Precaution Comments: knee handout given and knee precaution reviewed Restrictions Weight Bearing Restrictions: Yes LLE Weight Bearing: Weight bearing as tolerated    Mobility  Bed Mobility               General bed mobility comments: pt OOB in the recliner chair.   Transfers Overall transfer level: Needs assistance Equipment used: Rolling walker (2 wheeled) Transfers: Sit to/from Stand Sit to Stand: Min guard         General transfer comment: Min guard assist for safety.  Verbal cues for safe hand placement.   Ambulation/Gait Ambulation/Gait assistance: Supervision Ambulation Distance (Feet): 100 Feet Assistive device: Rolling walker (2 wheeled) Gait Pattern/deviations: Step-to pattern;Antalgic;Trunk flexed Gait velocity: decreased   General Gait Details: Verbal cues for upright posture (pt reports two back surgeries, so trunk flexion may be baseline).  Verbal cues for good heel to toe gait pattern.           Balance Overall balance  assessment: Needs assistance Sitting-balance support: No upper extremity supported;Feet supported Sitting balance-Leahy Scale: Good     Standing balance support: Bilateral upper extremity supported;No upper extremity supported;Single extremity supported Standing balance-Leahy Scale: Fair                      Cognition Arousal/Alertness: Awake/alert Behavior During Therapy: WFL for tasks assessed/performed Overall Cognitive Status: Within Functional Limits for tasks assessed                      Exercises Total Joint Exercises Ankle Circles/Pumps: AROM;Both;10 reps Quad Sets: AROM;Left;10 reps Towel Squeeze: AROM;Both;10 reps Heel Slides: AAROM;Left;10 reps        Pertinent Vitals/Pain Pain Assessment: 0-10 Pain Score: 8  Pain Location: left knee Pain Descriptors / Indicators: Burning;Aching Pain Intervention(s): Monitored during session;Limited activity within patient's tolerance;Repositioned;Ice applied;Premedicated before session           PT Goals (current goals can now be found in the care plan section) Acute Rehab PT Goals Patient Stated Goal: go home Progress towards PT goals: Progressing toward goals    Frequency  7X/week    PT Plan Current plan remains appropriate       End of Session   Activity Tolerance: Patient limited by pain Patient left: in chair;with call bell/phone within reach;with family/visitor present     Time: 1131-1156 PT Time Calculation (min) (ACUTE ONLY): 25 min  Charges:  $Gait Training: 8-22 mins $Therapeutic Exercise: 8-22 mins  Barbarann Ehlers [place], [place], DPT [phone]   12/17/2015, 3:46 PM

## 2015-12-21 NOTE — Discharge Summary (Signed)
Physician Discharge Summary      Patient ID: Jackie Parker MRN: [id number] DOB/AGE: October 17, 1940 75 y.o.  Admit date: 12/14/2015 Discharge date: 12/17/2015  Admission Diagnoses:  Principal Problem:   Osteoarthritis of left knee Active Problems:   Localized osteoarthritis of left knee   Discharge Diagnoses:  Same  Past Medical History  Diagnosis Date  . Asthma   . Hypertension   . Bronchitis   . Colitis, ulcerative ([place])   . Cataracts, bilateral   . Pollen allergies   . DJD (degenerative joint disease)   . Psoriasis   . Hypokalemia   . AKI (acute kidney injury) ([place]) 12/2015    Surgeries: Procedure(s): COMPUTER ASSISTED LEFT TOTAL KNEE ARTHROPLASTY on 12/14/2015   Consultants:    Discharged Condition: Improved  Hospital Course: Desarae Placide is an 75 y.o. female who was admitted 12/14/2015 with a chief complaint of No chief complaint on file. , and found to have a diagnosis of Osteoarthritis of left knee.  she was brought to the operating room on 12/14/2015 and underwent the above named procedures.    She was given perioperative antibiotics:  Anti-infectives    Start     Dose/Rate Route Frequency Ordered Stop   12/14/15 0700  ceFAZolin (ANCEF) IVPB 2g/100 mL premix     2 g 200 mL/hr over 30 Minutes Intravenous To [facility] 12/13/15 1356 12/14/15 0740   12/14/15 0600  ceFAZolin (ANCEF) 2 g in dextrose 5 % 100 mL IVPB  Status:  Discontinued     2 g 240 mL/hr over 30 Minutes Intravenous On call to O.R. 12/13/15 1353 12/13/15 1356    Recovered in the PACU and was transferred to [facility]27. POD#1 demonstraded ability to stand and ambulate a few feet. Hgb 11. VSS, dressing in good condition, had started CPM, dressing reduced ROM. POD#2 awake alert and oriented x 4. Ambulated with a walker into the hallway.Dressing discontinued, minimal swelling and no drainage. POD#3 left leg and thigh with moderate swelling Homan's sign mildly positive. Doppler ultrasound  negative for DVT either leg or thigh. Dressing changed no drainge or  Erythrema. Taking and tolerating po narcotics and nourishment. Maintained on aspirin for antiDVT prophylaxis. Discharged home on POD#3 stable and with a dry dressing, May begin bathing at POD #4. Will continue with CPM and HHN for PT at home.   She was given sequential compression devices, early ambulation, and chemoprophylaxis for DVT prophylaxis.  She benefited maximally from their hospital stay and there were no complications.    Recent vital signs:  Filed Vitals:   12/16/15 1900 12/17/15 0617  BP: 94/46 112/69  Pulse: 108 102  Temp: 98.1 F (36.7 C) 98.2 F (36.8 C)  Resp: 18 16    Recent laboratory studies:  Results for orders placed or performed during the hospital encounter of 12/14/15  CBC  Result Value Ref Range   WBC 11.9 (H) 4.0 - 10.5 K/uL   RBC 4.06 3.87 - 5.11 MIL/uL   Hemoglobin 12.3 12.0 - 15.0 g/dL   HCT 38.7 36.0 - 46.0 %   MCV 95.3 78.0 - 100.0 fL   MCH 30.3 26.0 - 34.0 pg   MCHC 31.8 30.0 - 36.0 g/dL   RDW 14.4 11.5 - 15.5 %   Platelets 172 150 - 400 K/uL    Discharge Medications:     Medication List    TAKE these medications        amLODipine 5 MG tablet  Commonly known as:  NORVASC  Take 5 mg by mouth daily.     aspirin 325 MG EC tablet  Take 1 tablet (325 mg total) by mouth 2 (two) times daily after a meal.     bimatoprost 0.03 % ophthalmic solution  Commonly known as:  LUMIGAN  Place 1 drop into both eyes 2 (two) times daily.     brinzolamide 1 % ophthalmic suspension  Commonly known as:  AZOPT  Place 1 drop into the right eye 2 (two) times daily.     cetirizine 10 MG chewable tablet  Commonly known as:  ZYRTEC  Chew 10 mg by mouth daily.     docusate sodium 100 MG capsule  Commonly known as:  COLACE  Take 1 capsule (100 mg total) by mouth 2 (two) times daily.     Fluticasone-Salmeterol 250-50 MCG/DOSE Aepb  Commonly known as:  ADVAIR  Inhale 1 puff into the  lungs every 12 (twelve) hours.     montelukast 10 MG tablet  Commonly known as:  SINGULAIR  Take 10 mg by mouth at bedtime.     oxyCODONE 5 MG immediate release tablet  Commonly known as:  Oxy IR/ROXICODONE  Take 1-2 tablets (5-10 mg total) by mouth every 3 (three) hours as needed for severe pain or breakthrough pain.     traMADol 50 MG tablet  Commonly known as:  ULTRAM  Take by mouth every 6 (six) hours as needed.     traMADol 50 MG tablet  Commonly known as:  ULTRAM  Take 1 tablet (50 mg total) by mouth every 6 (six) hours as needed for moderate pain.     VITAMIN D PO  Take 1 tablet by mouth daily.        Diagnostic Studies: Dg Chest 2 View  12/04/2015  CLINICAL DATA:  Z01.818 (ICD-10-CM) - Preoperative testing Pre-op chest Hypertension/meds Hx of bronchitis EXAM: CHEST  2 VIEW COMPARISON:  Radiograph 05/19/2008 FINDINGS: Normal mediastinum and cardiac silhouette. Normal pulmonary vasculature. No evidence of effusion, infiltrate, or pneumothorax. Trace blunting of the LEFT costophrenic angle. No acute bony abnormality. Anterior cervical fusion IMPRESSION: No acute cardiopulmonary process.  Minimal LEFT basilar atelectasis. Anterior cervical fusion. Electronically Signed   By: Suzy Bouchard M.D.   On: 12/04/2015 15:27    Disposition: 06-Home-Health Care Svc      Discharge Instructions    CPM    Complete by:  As directed   Continuous passive motion machine (CPM):      Use the CPM from 0 to 70 for 8 hours per day.      You may increase by 10 per day.  You may break it up into 2 or 3 sessions per day.      Use CPM for 4 weeks or until you are told to stop.     Call MD / Call 911    Complete by:  As directed   If you experience chest pain or shortness of breath, CALL 911 and be transported to the hospital emergency room.  If you develope a fever above 101 F, pus (white drainage) or increased drainage or redness at the wound, or calf pain, call your surgeon's office.      Change dressing    Complete by:  As directed   Change dressing on 12/18/2015, then change the dressing daily with sterile 4 x 4 inch gauze dressing and apply TED hose.  You may clean the incision with alcohol prior to redressing.     Constipation Prevention  Complete by:  As directed   Drink plenty of fluids.  Prune juice may be helpful.  You may use a stool softener, such as Colace (over the counter) 100 mg twice a day.  Use MiraLax (over the counter) for constipation as needed.     Diet - low sodium heart healthy    Complete by:  As directed      Discharge instructions    Complete by:  As directed   Keep knee incision dry for 5 days post op then may wet while bathing. Therapy daily and CPM goal full extension and greater than 90 degrees flexion. Call if fever or chills or increased drainage. Go to ER if acutely short of breath or call for ambulance. Return for follow up in 2 weeks. May full weight bear on the surgical leg unless told otherwise. Use knee immobilizer until able to straight leg raise off bed with knee stable. In house walking for first 2 weeks. INSTRUCTIONS AFTER JOINT REPLACEMENT   Remove items at home which could result in a fall. This includes throw rugs or furniture in walking pathways ICE to the affected joint every three hours while awake for 30 minutes at a time, for at least the first 3-5 days, and then as needed for pain and swelling.  Continue to use ice for pain and swelling. You may notice swelling that will progress down to the foot and ankle.  This is normal after surgery.  Elevate your leg when you are not up walking on it.   Continue to use the breathing machine you got in the hospital (incentive spirometer) which will help keep your temperature down.  It is common for your temperature to cycle up and down following surgery, especially at night when you are not up moving around and exerting yourself.  The breathing machine keeps your lungs expanded and your  temperature down.   DIET:  As you were doing prior to hospitalization, we recommend a well-balanced diet.  DRESSING / WOUND CARE / SHOWERING  You may change your dressing 3-5 days after surgery.  Then change the dressing every day with sterile gauze.  Please use good hand washing techniques before changing the dressing.  Do not use any lotions or creams on the incision until instructed by your surgeon.  ACTIVITY  Increase activity slowly as tolerated, but follow the weight bearing instructions below.   No driving for 6 weeks or until further direction given by your physician.  You cannot drive while taking narcotics.  No lifting or carrying greater than 10 lbs. until further directed by your surgeon. Avoid periods of inactivity such as sitting longer than an hour when not asleep. This helps prevent blood clots.  You may return to work once you are authorized by your doctor.     WEIGHT BEARING   Weight bearing as tolerated with assist device (walker, cane, etc) as directed, use it as long as suggested by your surgeon or therapist, typically at least 4-6 weeks.   EXERCISES  Results after joint replacement surgery are often greatly improved when you follow the exercise, range of motion and muscle strengthening exercises prescribed by your doctor. Safety measures are also important to protect the joint from further injury. Any time any of these exercises cause you to have increased pain or swelling, decrease what you are doing until you are comfortable again and then slowly increase them. If you have problems or questions, call your caregiver or physical therapist for advice.  Rehabilitation is important following a joint replacement. After just a few days of immobilization, the muscles of the leg can become weakened and shrink (atrophy).  These exercises are designed to build up the tone and strength of the thigh and leg muscles and to improve motion. Often times heat used for twenty to  thirty minutes before working out will loosen up your tissues and help with improving the range of motion but do not use heat for the first two weeks following surgery (sometimes heat can increase post-operative swelling).   These exercises can be done on a training (exercise) mat, on the floor, on a table or on a bed. Use whatever works the best and is most comfortable for you.    Use music or television while you are exercising so that the exercises are a pleasant break in your day. This will make your life better with the exercises acting as a break in your routine that you can look forward to.   Perform all exercises about fifteen times, three times per day or as directed.  You should exercise both the operative leg and the other leg as well.   Exercises include:   Quad Sets - Tighten up the muscle on the front of the thigh (Quad) and hold for 5-10 seconds.   Straight Leg Raises - With your knee straight (if you were given a brace, keep it on), lift the leg to 60 degrees, hold for 3 seconds, and slowly lower the leg.  Perform this exercise against resistance later as your leg gets stronger.  Leg Slides: Lying on your back, slowly slide your foot toward your buttocks, bending your knee up off the floor (only go as far as is comfortable). Then slowly slide your foot back down until your leg is flat on the floor again.  Angel Wings: Lying on your back spread your legs to the side as far apart as you can without causing discomfort.  Hamstring Strength:  Lying on your back, push your heel against the floor with your leg straight by tightening up the muscles of your buttocks.  Repeat, but this time bend your knee to a comfortable angle, and push your heel against the floor.  You may put a pillow under the heel to make it more comfortable if necessary.   A rehabilitation program following joint replacement surgery can speed recovery and prevent re-injury in the future due to weakened muscles. Contact your  doctor or a physical therapist for more information on knee rehabilitation.    CONSTIPATION  Constipation is defined medically as fewer than three stools per week and severe constipation as less than one stool per week.  Even if you have a regular bowel pattern at home, your normal regimen is likely to be disrupted due to multiple reasons following surgery.  Combination of anesthesia, postoperative narcotics, change in appetite and fluid intake all can affect your bowels.   YOU MUST use at least one of the following options; they are listed in order of increasing strength to get the job done.  They are all available over the counter, and you may need to use some, POSSIBLY even all of these options:    Drink plenty of fluids (prune juice may be helpful) and high fiber foods Colace 100 mg by mouth twice a day  Senokot for constipation as directed and as needed Dulcolax (bisacodyl), take with full glass of water  Miralax (polyethylene glycol) once or twice a day as needed.  If you  have tried all these things and are unable to have a bowel movement in the first 3-4 days after surgery call either your surgeon or your primary doctor.    If you experience loose stools or diarrhea, hold the medications until you stool forms back up.  If your symptoms do not get better within 1 week or if they get worse, check with your doctor.  If you experience "the worst abdominal pain ever" or develop nausea or vomiting, please contact the office immediately for further recommendations for treatment.   ITCHING:  If you experience itching with your medications, try taking only a single pain pill, or even half a pain pill at a time.  You can also use Benadryl over the counter for itching or also to help with sleep.   TED HOSE STOCKINGS:  Use stockings on both legs until for at least 2 weeks or as directed by physician office. They may be removed at night for sleeping.  MEDICATIONS:  See your medication summary on the  "After Visit Summary" that nursing will review with you.  You may have some home medications which will be placed on hold until you complete the course of blood thinner medication.  It is important for you to complete the blood thinner medication as prescribed.  PRECAUTIONS:  If you experience chest pain or shortness of breath - call 911 immediately for transfer to the hospital emergency department.   If you develop a fever greater that 101 F, purulent drainage from wound, increased redness or drainage from wound, foul odor from the wound/dressing, or calf pain - CONTACT YOUR SURGEON.                                                   FOLLOW-UP APPOINTMENTS:  If you do not already have a post-op appointment, please call the office for an appointment to be seen by your surgeon.  Guidelines for how soon to be seen are listed in your "After Visit Summary", but are typically between 1-4 weeks after surgery.  OTHER INSTRUCTIONS:   Knee Replacement:  Do not place pillow under knee, focus on keeping the knee straight while resting. CPM instructions: 0-90 degrees, 2 hours in the morning, 2 hours in the afternoon, and 2 hours in the evening. Place foam block, curve side up under heel at all times except when in CPM or when walking.  DO NOT modify, tear, cut, or change the foam block in any way.  MAKE SURE YOU:  Understand these instructions.  Get help right away if you are not doing well or get worse.    Thank you for letting us be a part of your medical care team.  It is a privilege we respect greatly.  We hope these instructions will help you stay on track for a fast and full recovery!     Do not put a pillow under the knee. Place it under the heel.    Complete by:  As directed      Driving restrictions    Complete by:  As directed   No driving for 6 weeks     Increase activity slowly as tolerated    Complete by:  As directed      Lifting restrictions    Complete by:  As directed   No lifting for  8 weeks  Weight bearing as tolerated    Complete by:  As directed            Follow-up Information    Follow up with Keaja Reaume E, MD In 2 weeks.   Specialty:  Orthopedic Surgery   Why:  For wound re-check   Contact information:   [place] [address] [phone]       Follow up with [facility].   Why:  They will contact you to schedule home therapy visits.   Contact information:   [address] [place] [address] [phone]        Signed: Jessy Oto 12/21/2015, 12:17 PM

## 2016-01-11 ENCOUNTER — Ambulatory Visit: Payer: Medicare Other | Attending: Specialist | Admitting: Physical Therapy

## 2016-01-11 ENCOUNTER — Encounter: Payer: Self-pay | Admitting: Physical Therapy

## 2016-01-11 DIAGNOSIS — M25562 Pain in left knee: Secondary | ICD-10-CM | POA: Diagnosis present

## 2016-01-11 DIAGNOSIS — M2560 Stiffness of unspecified joint, not elsewhere classified: Secondary | ICD-10-CM | POA: Diagnosis not present

## 2016-01-11 DIAGNOSIS — M6281 Muscle weakness (generalized): Secondary | ICD-10-CM | POA: Diagnosis present

## 2016-01-11 DIAGNOSIS — R262 Difficulty in walking, not elsewhere classified: Secondary | ICD-10-CM | POA: Diagnosis present

## 2016-01-11 DIAGNOSIS — R293 Abnormal posture: Secondary | ICD-10-CM | POA: Insufficient documentation

## 2016-01-11 DIAGNOSIS — M25662 Stiffness of left knee, not elsewhere classified: Secondary | ICD-10-CM | POA: Insufficient documentation

## 2016-01-11 DIAGNOSIS — M62830 Muscle spasm of back: Secondary | ICD-10-CM | POA: Insufficient documentation

## 2016-01-11 DIAGNOSIS — M545 Low back pain: Secondary | ICD-10-CM | POA: Diagnosis not present

## 2016-01-12 NOTE — Therapy (Signed)
[place], [place], [address] Phone: [phone]   Fax:  [phone]  Physical Therapy Treatment  Patient Details  Name: Jackie Parker MRN: [id number] Date of Birth: Dec 29, 1940 Referring Provider: Dr. Louanne Skye   Encounter Date: 01/11/2016      PT End of Session - 01/11/16 1525    Visit Number 1   Number of Visits 16   Date for PT Re-Evaluation 03/08/16   Authorization Type medicare    PT Start Time 1500   PT Stop Time 1545   PT Time Calculation (min) 45 min   Activity Tolerance Patient tolerated treatment well   Behavior During Therapy [facility] for tasks assessed/performed      Past Medical History  Diagnosis Date  . Asthma   . Hypertension   . Bronchitis   . Colitis, ulcerative ([place])   . Cataracts, bilateral   . Pollen allergies   . DJD (degenerative joint disease)   . Psoriasis   . Hypokalemia   . AKI (acute kidney injury) ([place]) 12/2015    Past Surgical History  Procedure Laterality Date  . Colostomy    . Ileostomy    . Back surgery    . Tubal ligation    . 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 Knee arthroplasty Left 12/14/2015    Procedure: COMPUTER ASSISTED LEFT TOTAL KNEE ARTHROPLASTY;  Surgeon: Jessy Oto, MD;  Location: [place];  Service: Orthopedics;  Laterality: Left;    There were no vitals filed for this visit.      Subjective Assessment - 01/11/16 1510    Subjective Patient reports only shooting pains. She has no pain just sitting. Her shooting pains can reach a 5-6/10.    Pertinent History Pt has a history of bilateral knee osteoarthritis. Over the years her pain has increased.  The pain increased to the point were she was having difficulty walking. She also has pain in her right knee.    Limitations Standing;Sitting;Lifting;Walking;House hold activities   How long can you sit comfortably? an hour    How long can you stand comfortably? > 10 min    How long can you walk  comfortably? household ambulation with single point cane    Diagnostic tests nothing post op   Patient Stated Goals Walking with less pain    Currently in Pain? Yes   Pain Score 0-No pain  no pain but shooting pain can reach 7/10    Pain Location Knee   Pain Orientation Left   Pain Descriptors / Indicators Aching;Shooting;Sharp   Pain Onset 1 to 4 weeks ago   Pain Frequency Intermittent   Aggravating Factors  walking, standing, bending,    Pain Relieving Factors Ice; oxycodone    Effect of Pain on Daily Activities difficulty performing ADL's             [facility] PT Assessment - 01/12/16 0001    Assessment   Medical Diagnosis Left total knee arthroplasty    Onset Date/Surgical Date 12/14/15   Hand Dominance Right   Next MD Visit 01/09/16   Prior Therapy Home health    Precautions   Precautions None   Restrictions   Weight Bearing Restrictions Yes   LLE Weight Bearing Weight bearing as tolerated   Home Environment   Additional Comments Patient lives at home with her daughter. She has 3 steps to get into her house.    Prior Function   Level of Independence Independent   Cognition  Overall Cognitive Status Within Functional Limits for tasks assessed   Observation/Other Assessments   Observations well healing surgical wound    Coordination   Gross Motor Movements are Fluid and Coordinated Yes   Posture/Postural Control   Posture Comments mild obesity; rounded shoulders,    AROM   Right/Left Knee Right;Left   Right Knee Extension 0   Right Knee Flexion 125   Left Knee Extension 6   Left Knee Flexion 93   PROM   Overall PROM Comments pain with flexion and extension    PROM Assessment Site Knee   Right/Left Knee Right;Left   Right Knee Extension 0   Right Knee Flexion 125   Left Knee Extension 5   Left Knee Flexion 100   Strength   Strength Assessment Site Hip   Right/Left Hip Right;Left   Right Hip Flexion 5/5   Right Hip ABduction 5/5   Right Hip ADduction 4/5    Left Hip Flexion 4/5   Left Hip Extension 4/5   Left Hip Internal Rotation 5/5   Left Hip ABduction 5/5   Right/Left Knee Right;Left   Right Knee Flexion 5/5   Right Knee Extension 5/5   Left Knee Flexion 4+/5   Left Knee Extension 4/5   Palpation   Palpation comment No unexpected tenderness to palpation    Ambulation/Gait   Gait Comments decreased single leg stance on the L side, patient using a cane with step through gait pattern but not symetrical step through; decreased single leg stance time on the right.                      [place] Adult PT Treatment/Exercise - 01/12/16 0001    Exercises   Exercises Knee/Hip   Knee/Hip Exercises: Stretches   Other Knee/Hip Stretches knee flexion stretch with strap 5 x10sec hold    Other Knee/Hip Stretches knee extension stretch 5x10sec hold    Knee/Hip Exercises: Standing   Heel Raises 1 set;10 reps   Knee Flexion Both;1 set;10 reps   Functional Squat 1 set;10 reps   Knee/Hip Exercises: Seated   Long Arc Quad Limitations 2x10   Knee/Hip Exercises: Supine   Quad Sets Limitations x10 5 sec hold    Short Arc Quad Sets 2 sets;10 reps   Manual Therapy   Manual therapy comments PROM into flexion and extension to improve range and decrease pain                 PT Education - 01/12/16 1524    Education provided Yes   Education Details Updated HEP. PT educated on progression of activity    Person(s) Educated Patient   Methods Explanation   Comprehension Verbalized understanding          PT Short Term Goals - 01/12/16 1533    PT SHORT TERM GOAL #1   Title pt will be I with inital HEP    Time 4   Period Weeks   Status New   PT SHORT TERM GOAL #2   Title -Pt will increase L knee extension by 5 degrees   Baseline -5 degrees    Time 4   Period Weeks   Status New   PT SHORT TERM GOAL #3   Title Patient will ambulate 200' w/ no device with proper gait technique and > 2/10 pain    PT SHORT TERM GOAL #4   Title Pt  will increase Lleftsingle leg stance time to 5 seconds  Time 4   Period Weeks   Status New   PT SHORT TERM GOAL #5   Title Pt will increase gross left lower extremity stregth to 4+/5            PT Long Term Goals - Jan 23, 2016 1535    PT LONG TERM GOAL #1   Title Pt will go up/down 8 steps with reciprocol gait pattern    Time 8   Period Weeks   Status New   PT LONG TERM GOAL #2   Title Pt will bend to pick object off the ground without incresased pain    Time 8   Period Weeks   Status New   PT LONG TERM GOAL #3   Title Pt will demonstrate 115 degrees of left knee flexion in order to get up and down out of a low chair without difficulty   Time 8   Period Weeks   Status New               Plan - 23-Jan-2016 1526    Clinical Impression Statement Pt is a 75 year old female S/P L TKA on 12/14/2015. She presents with expected decreases in stregth, stability, range of motion, and ability to ambulate. She has pain with her ADl's and IADL's. She is currently using her cane for ambulation. She would benefit from skilled therapy to adress the above deficits.    Rehab Potential Good   PT Frequency 2x / week   PT Duration 8 weeks   PT Treatment/Interventions ADLs/Self Care Home Management;Electrical Stimulation;Cryotherapy;Traction;Moist Heat;Ultrasound;Stair training;Functional mobility training;Therapeutic activities;Therapeutic exercise;Balance training;Neuromuscular re-education;Patient/family education;Manual techniques;Dry needling;Passive range of motion   PT Next Visit Plan begin low stair training, leg press, single leg stance if able, TKE, manual therapy for flexion/ extension. Condsider yellow t--band for LAQ if able.    PT Home Exercise Plan SAQ, LAQ, SLR, quad set, standing march, mini squat, heel raise, heel slide with strap, extension stretch,    Consulted and Agree with Plan of Care Patient      Patient will benefit from skilled therapeutic intervention in order to  improve the following deficits and impairments:  Abnormal gait, Decreased activity tolerance, Decreased balance, Decreased mobility, Decreased range of motion, Decreased scar mobility, Decreased strength, Difficulty walking, Hypermobility, Pain  Visit Diagnosis: Pain in left knee - Plan: PT plan of care cert/re-cert  Stiffness of left knee, not elsewhere classified - Plan: PT plan of care cert/re-cert  Muscle weakness (generalized) - Plan: PT plan of care cert/re-cert  Difficulty in walking, not elsewhere classified - Plan: PT plan of care cert/re-cert       G-Codes - 01-23-16 1538    Functional Assessment Tool Used clinical judgement, objective measures    Functional Limitation Mobility: Walking and moving around   Mobility: Walking and Moving Around Current Status [phone]) At least 40 percent but less than 60 percent impaired, limited or restricted   Mobility: Walking and Moving Around Goal Status [phone]) At least 1 percent but less than 20 percent impaired, limited or restricted      Problem List Patient Active Problem List   Diagnosis Date Noted  . Osteoarthritis of left knee 12/14/2015    Class: End Stage  . Localized osteoarthritis of left knee 12/14/2015  . Small bowel obstruction, partial ([place]) 08/30/2013  . Acute renal failure ([place]) 08/30/2013  . 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 N&V (nausea and vomiting) 08/30/2013  . Abdominal pain, acute 08/30/2013  . SBO (small  bowel obstruction) ([place]) 08/30/2013  . Hypokalemia 08/30/2013  . Partial small bowel obstruction ([place]) 08/30/2013  . Postmenopausal bleeding 03/18/2013  . OSTEOARTHRITIS, KNEE, LEFT 03/20/2010  . TRICUSPID REGURGITATION, MILD 03/03/2010  . RENAL INSUFFICIENCY 03/02/2010  . MORBID OBESITY 02/18/2010  . PREMATURE ATRIAL CONTRACTIONS 02/18/2010  . PREMATURE VENTRICULAR CONTRACTIONS 02/18/2010  . CARDIAC MURMUR 02/18/2010  . EDEMA 10/20/2009  . HYPOKALEMIA 07/30/2009  . RIB PAIN, RIGHT SIDED  05/26/2008  . ABDOMINAL PAIN, LEFT LOWER QUADRANT 12/31/2007  . ALLERGIC RHINITIS 06/25/2007  . ABNORMAL BLOOD CHEMISTRY , OTHER 06/25/2007  . UNSPECIFIED VITAMIN D DEFICIENCY 04/30/2007  . HYPOCALCEMIA 04/19/2007  . GLAUCOMA NEC 04/19/2007  . HYPERTENSION, ESSENTIAL NOS 04/19/2007  . ASTHMA 04/19/2007  . COLITIS, ULCERATIVE NOS 04/19/2007  . DISORDER, MENSTRUAL NEC 04/19/2007  . PSORIASIS 04/19/2007  . 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 GOUT NOS 02/21/2006    Carney Living PT DPT  01/12/2016, 3:40 PM  [facility] [address] [place], [place], [address] Phone: [phone]   Fax:  [phone]  Name: Jackie Parker MRN: [id number] Date of Birth: [birth year]

## 2016-01-19 ENCOUNTER — Ambulatory Visit: Payer: Medicare Other | Admitting: Physical Therapy

## 2016-01-19 DIAGNOSIS — M25662 Stiffness of left knee, not elsewhere classified: Secondary | ICD-10-CM

## 2016-01-19 DIAGNOSIS — R262 Difficulty in walking, not elsewhere classified: Secondary | ICD-10-CM

## 2016-01-19 DIAGNOSIS — M25562 Pain in left knee: Secondary | ICD-10-CM

## 2016-01-19 DIAGNOSIS — M6281 Muscle weakness (generalized): Secondary | ICD-10-CM

## 2016-01-19 NOTE — Therapy (Addendum)
[place], [place], [address] Phone: [phone]   Fax:  [phone]  Physical Therapy Treatment  Patient Details  Name: Jackie Parker MRN: [id number] Date of Birth: Jan 27, 1941 Referring Provider: Dr. Louanne Skye   Encounter Date: 01/19/2016      PT End of Session - 01/19/16 1545    Visit Number 2   Number of Visits 16   Date for PT Re-Evaluation 03/08/16   PT Start Time 1503   PT Stop Time 3329   PT Time Calculation (min) 52 min   Activity Tolerance Patient tolerated treatment well;No increased pain   Behavior During Therapy [facility] for tasks assessed/performed      Past Medical History  Diagnosis Date  . Asthma   . Hypertension   . Bronchitis   . Colitis, ulcerative ([place])   . Cataracts, bilateral   . Pollen allergies   . DJD (degenerative joint disease)   . Psoriasis   . Hypokalemia   . AKI (acute kidney injury) ([place]) 12/2015    Past Surgical History  Procedure Laterality Date  . Colostomy    . Ileostomy    . Back surgery    . Tubal ligation    . 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 Knee arthroplasty Left 12/14/2015    Procedure: COMPUTER ASSISTED LEFT TOTAL KNEE ARTHROPLASTY;  Surgeon: Jessy Oto, MD;  Location: [place];  Service: Orthopedics;  Laterality: Left;    There were no vitals filed for this visit.      Subjective Assessment - 01/19/16 1507    Subjective Sleeping good ,  does not have pain,  keeping up with her exercises.  Not taking pain medication.    Currently in Pain? No/denies   Pain Location Knee   Pain Orientation Left   Aggravating Factors  sitting too long   Pain Relieving Factors ice   Multiple Pain Sites No            [facility] PT Assessment - 01/19/16 0001    PROM   Left Knee Flexion 104  AA with strap                     [facility] Adult PT Treatment/Exercise - 01/19/16 1509    Ambulation/Gait   Pre-Gait Activities march with reach  SBA    Knee/Hip Exercises: Standing   Forward Step Up 10 reps  cues   Wall Squat Limitations mini squat at sink 10 X fatigue.   Knee/Hip Exercises: Seated   Long Arc Quad 10 reps  yellow band   Hamstring Curl 1 set;10 reps  yellow band   Sit to Sand 5 reps  20 inches from floor   Knee/Hip Exercises: Supine   Quad Sets Limitations 10 X    Short Arc Quad Sets 1 set;10 reps  2nd set with yellow band  10 X   Heel Slides 1 set  with strap   Straight Leg Raises 10 reps  with quad set,  10 degree quad lag   Patellar Mobs yes   Cryotherapy   Number Minutes Cryotherapy 10 Minutes   Cryotherapy Location Knee   Type of Cryotherapy --  cold pack   Manual Therapy   Manual therapy comments retrograde,  gentle PROM/stretches                  PT Short Term Goals - 01/19/16 1551    PT SHORT TERM GOAL #1   Title pt will  be I with inital HEP    Time 4   Period Weeks   Status On-going   PT SHORT TERM GOAL #2   Title -Pt will increase L knee extension by 5 degrees   Baseline -5 degrees    Time 4   Period Weeks   Status On-going   PT SHORT TERM GOAL #3   Title Patient will ambulate 200' w/ no device with proper gait technique and > 2/10 pain    Time 6   Period Weeks   Status Unable to assess   PT SHORT TERM GOAL #4   Title Pt will increase Lleftsingle leg stance time to 5 seconds   Time 4   Period Weeks   Status Unable to assess   PT SHORT TERM GOAL #5   Title Pt will increase gross left lower extremity stregth to 4+/5    Period Weeks   Status On-going           PT Long Term Goals - 01/12/16 1535    PT LONG TERM GOAL #1   Title Pt will go up/down 8 steps with reciprocol gait pattern    Time 8   Period Weeks   Status New   PT LONG TERM GOAL #2   Title Pt will bend to pick object off the ground without incresased pain    Time 8   Period Weeks   Status New   PT LONG TERM GOAL #3   Title Pt will demonstrate 115 degrees of left knee flexion in order to get up and  down out of a low chair without difficulty   Time 8   Period Weeks   Status New               Plan - 01/19/16 1546    Clinical Impression Statement No pain during session, limited by fatigue,  104 AA knee flexion.  She uses cane in community.  No cane needed in home.     PT Next Visit Plan Nore stretching, try low stairs, TKE  strength,  Walker stretch?   PT Home Exercise Plan SAQ, LAQ, SLR, quad set, standing march, mini squat, heel raise, heel slide with strap, extension stretch,    Consulted and Agree with Plan of Care Patient      Patient will benefit from skilled therapeutic intervention in order to improve the following deficits and impairments:  Abnormal gait, Decreased activity tolerance, Decreased balance, Decreased mobility, Decreased range of motion, Decreased scar mobility, Decreased strength, Difficulty walking, Hypermobility, Pain  Visit Diagnosis: Pain in left knee  Stiffness of left knee, not elsewhere classified  Muscle weakness (generalized)  Difficulty in walking, not elsewhere classified       Problem List Patient Active Problem List   Diagnosis Date Noted  . Osteoarthritis of left knee 12/14/2015    Class: End Stage  . Localized osteoarthritis of left knee 12/14/2015  . Small bowel obstruction, partial ([place]) 08/30/2013  . Acute renal failure ([place]) 08/30/2013  . 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 N&V (nausea and vomiting) 08/30/2013  . Abdominal pain, acute 08/30/2013  . SBO (small bowel obstruction) ([place]) 08/30/2013  . Hypokalemia 08/30/2013  . Partial small bowel obstruction ([place]) 08/30/2013  . Postmenopausal bleeding 03/18/2013  . OSTEOARTHRITIS, KNEE, LEFT 03/20/2010  . TRICUSPID REGURGITATION, MILD 03/03/2010  . RENAL INSUFFICIENCY 03/02/2010  . MORBID OBESITY 02/18/2010  . PREMATURE ATRIAL CONTRACTIONS 02/18/2010  . PREMATURE VENTRICULAR CONTRACTIONS 02/18/2010  . CARDIAC MURMUR 02/18/2010  .  EDEMA 10/20/2009  . HYPOKALEMIA  07/30/2009  . RIB PAIN, RIGHT SIDED 05/26/2008  . ABDOMINAL PAIN, LEFT LOWER QUADRANT 12/31/2007  . ALLERGIC RHINITIS 06/25/2007  . ABNORMAL BLOOD CHEMISTRY , OTHER 06/25/2007  . UNSPECIFIED VITAMIN D DEFICIENCY 04/30/2007  . HYPOCALCEMIA 04/19/2007  . GLAUCOMA NEC 04/19/2007  . HYPERTENSION, ESSENTIAL NOS 04/19/2007  . ASTHMA 04/19/2007  . COLITIS, ULCERATIVE NOS 04/19/2007  . DISORDER, MENSTRUAL NEC 04/19/2007  . PSORIASIS 04/19/2007  . 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 GOUT NOS 02/21/2006    Aric Jost 01/19/2016, 3:53 PM  [facility] [address] [place], [place], [address] Phone: [phone]   Fax:  [phone]  Name: Jackie Parker MRN: [id number] Date of Birth: [birth year]    Melvenia Needles, PTA 01/19/2016 3:53 PM Phone: [phone] Fax: [phone]

## 2016-01-21 ENCOUNTER — Ambulatory Visit: Payer: Medicare Other | Admitting: Physical Therapy

## 2016-01-21 DIAGNOSIS — M25562 Pain in left knee: Secondary | ICD-10-CM

## 2016-01-21 DIAGNOSIS — M6281 Muscle weakness (generalized): Secondary | ICD-10-CM

## 2016-01-21 DIAGNOSIS — M25662 Stiffness of left knee, not elsewhere classified: Secondary | ICD-10-CM

## 2016-01-21 DIAGNOSIS — R262 Difficulty in walking, not elsewhere classified: Secondary | ICD-10-CM

## 2016-01-21 NOTE — Therapy (Signed)
[place], [place], [address] Phone: [phone]   Fax:  [phone]  Physical Therapy Treatment  Patient Details  Name: Jackie Parker MRN: [id number] Date of Birth: 1940/10/20 Referring Provider: Dr. Louanne Skye   Encounter Date: 01/21/2016      PT End of Session - 01/21/16 1503    Visit Number 3   Number of Visits 16   Date for PT Re-Evaluation 03/08/16   PT Start Time 1443   PT Stop Time 1505   PT Time Calculation (min) 50 min   Activity Tolerance Patient tolerated treatment well   Behavior During Therapy [facility] for tasks assessed/performed      Past Medical History  Diagnosis Date  . Asthma   . Hypertension   . Bronchitis   . Colitis, ulcerative ([place])   . Cataracts, bilateral   . Pollen allergies   . DJD (degenerative joint disease)   . Psoriasis   . Hypokalemia   . AKI (acute kidney injury) ([place]) 12/2015    Past Surgical History  Procedure Laterality Date  . Colostomy    . Ileostomy    . Back surgery    . Tubal ligation    . 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 Knee arthroplasty Left 12/14/2015    Procedure: COMPUTER ASSISTED LEFT TOTAL KNEE ARTHROPLASTY;  Surgeon: Jessy Oto, MD;  Location: [place];  Service: Orthopedics;  Laterality: Left;    There were no vitals filed for this visit.      Subjective Assessment - 01/21/16 1414    Subjective "my ankle is feeling sore today"    Currently in Pain? Yes   Pain Score 8   took pain meds at 10:30 this AM   Pain Location Knee   Pain Orientation Left   Pain Descriptors / Indicators Aching;Throbbing   Pain Onset More than a month ago   Pain Frequency Constant            [facility] PT Assessment - 01/21/16 1432    AROM   Left Knee Extension -4   Left Knee Flexion 98                     [facility] Adult PT Treatment/Exercise - 01/21/16 1412    Knee/Hip Exercises: Stretches   Passive Hamstring Stretch 2 reps;30 seconds    Quad Stretch 3 reps;30 seconds   Knee/Hip Exercises: Aerobic   Stationary Bike L1 x 5 min  rocking back and forth    Knee/Hip Exercises: Standing   Knee Flexion AROM;Left;1 set;10 reps   Forward Step Up Left;2 sets;10 reps;Hand Hold: 1;Step Height: 6"   Functional Squat 1 set;10 reps   Gait Training 2 x 20 ft with exaggerated heel strike and toe off   cues for taking smaller steps for heel strike   Knee/Hip Exercises: Seated   Long Arc Quad 3 sets;Left;AROM;Strengthening;Weights  1 set with 2#, 2 set with 4#   Hamstring Curl AROM;Left;2 sets;10 reps  with green theraband   Knee/Hip Exercises: Supine   Heel Slides 1 set;10 reps   Straight Leg Raises AROM;Strengthening;1 set;10 reps   Cryotherapy   Number Minutes Cryotherapy 10 Minutes   Cryotherapy Location Knee   Type of Cryotherapy Ice pack  in supine                  PT Short Term Goals - 01/19/16 1551    PT SHORT TERM GOAL #1   Title pt  will be I with inital HEP    Time 4   Period Weeks   Status On-going   PT SHORT TERM GOAL #2   Title -Pt will increase L knee extension by 5 degrees   Baseline -5 degrees    Time 4   Period Weeks   Status On-going   PT SHORT TERM GOAL #3   Title Patient will ambulate 200' w/ no device with proper gait technique and > 2/10 pain    Time 6   Period Weeks   Status Unable to assess   PT SHORT TERM GOAL #4   Title Pt will increase Lleftsingle leg stance time to 5 seconds   Time 4   Period Weeks   Status Unable to assess   PT SHORT TERM GOAL #5   Title Pt will increase gross left lower extremity stregth to 4+/5    Period Weeks   Status On-going           PT Long Term Goals - 01/12/16 1535    PT LONG TERM GOAL #1   Title Pt will go up/down 8 steps with reciprocol gait pattern    Time 8   Period Weeks   Status New   PT LONG TERM GOAL #2   Title Pt will bend to pick object off the ground without incresased pain    Time 8   Period Weeks   Status New   PT LONG  TERM GOAL #3   Title Pt will demonstrate 115 degrees of left knee flexion in order to get up and down out of a low chair without difficulty   Time 8   Period Weeks   Status New               Plan - 01/21/16 1458    Clinical Impression Statement Mrs. Meaney initally reported pain at 8/10 with stiffness in the ankle. She improved her knee AROM to 98 degrees today. focused on quad strengthening and hamstring strengthening as well as gait training with cues for heel strike and toe off. utlized ice post session to calm down soreness. post session she rpeorted pain dropped to 4/10.    PT Next Visit Plan More stretching, try low stairs, TKE  strength,  Walker stretch, gait training,    Consulted and Agree with Plan of Care Patient      Patient will benefit from skilled therapeutic intervention in order to improve the following deficits and impairments:  Abnormal gait, Decreased activity tolerance, Decreased balance, Decreased mobility, Decreased range of motion, Decreased scar mobility, Decreased strength, Difficulty walking, Hypermobility, Pain  Visit Diagnosis: Pain in left knee  Stiffness of left knee, not elsewhere classified  Muscle weakness (generalized)  Difficulty in walking, not elsewhere classified     Problem List Patient Active Problem List   Diagnosis Date Noted  . Osteoarthritis of left knee 12/14/2015    Class: End Stage  . Localized osteoarthritis of left knee 12/14/2015  . Small bowel obstruction, partial ([place]) 08/30/2013  . Acute renal failure ([place]) 08/30/2013  . 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 N&V (nausea and vomiting) 08/30/2013  . Abdominal pain, acute 08/30/2013  . SBO (small bowel obstruction) ([place]) 08/30/2013  . Hypokalemia 08/30/2013  . Partial small bowel obstruction ([place]) 08/30/2013  . Postmenopausal bleeding 03/18/2013  . OSTEOARTHRITIS, KNEE, LEFT 03/20/2010  . TRICUSPID REGURGITATION, MILD 03/03/2010  . RENAL INSUFFICIENCY  03/02/2010  . MORBID OBESITY 02/18/2010  . PREMATURE ATRIAL CONTRACTIONS 02/18/2010  .  PREMATURE VENTRICULAR CONTRACTIONS 02/18/2010  . CARDIAC MURMUR 02/18/2010  . EDEMA 10/20/2009  . HYPOKALEMIA 07/30/2009  . RIB PAIN, RIGHT SIDED 05/26/2008  . ABDOMINAL PAIN, LEFT LOWER QUADRANT 12/31/2007  . ALLERGIC RHINITIS 06/25/2007  . ABNORMAL BLOOD CHEMISTRY , OTHER 06/25/2007  . UNSPECIFIED VITAMIN D DEFICIENCY 04/30/2007  . HYPOCALCEMIA 04/19/2007  . GLAUCOMA NEC 04/19/2007  . HYPERTENSION, ESSENTIAL NOS 04/19/2007  . ASTHMA 04/19/2007  . COLITIS, ULCERATIVE NOS 04/19/2007  . DISORDER, MENSTRUAL NEC 04/19/2007  . PSORIASIS 04/19/2007  . 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 GOUT NOS 02/21/2006   Starr Lake PT, DPT, LAT, ATC  01/21/2016  3:06 PM      [facility] [address] [place], [place], [address] Phone: [phone]   Fax:  [phone]  Name: See Beharry MRN: [id number] Date of Birth: [birth year]

## 2016-01-26 ENCOUNTER — Ambulatory Visit: Payer: Medicare Other | Admitting: Physical Therapy

## 2016-01-26 DIAGNOSIS — M25562 Pain in left knee: Secondary | ICD-10-CM

## 2016-01-26 DIAGNOSIS — M25662 Stiffness of left knee, not elsewhere classified: Secondary | ICD-10-CM

## 2016-01-26 DIAGNOSIS — M6281 Muscle weakness (generalized): Secondary | ICD-10-CM

## 2016-01-26 DIAGNOSIS — R262 Difficulty in walking, not elsewhere classified: Secondary | ICD-10-CM

## 2016-01-26 NOTE — Therapy (Signed)
[place], [place], [address] Phone: [phone]   Fax:  [phone]  Physical Therapy Treatment  Patient Details  Name: Jackie Parker MRN: [id number] Date of Birth: 1941/01/04 Referring Provider: Dr. Louanne Skye   Encounter Date: 01/26/2016      PT End of Session - 01/26/16 1552    Visit Number 4   Date for PT Re-Evaluation 03/08/16   PT Start Time 1503   PT Stop Time 1555   PT Time Calculation (min) 52 min   Activity Tolerance Patient tolerated treatment well   Behavior During Therapy [facility] for tasks assessed/performed      Past Medical History  Diagnosis Date  . Asthma   . Hypertension   . Bronchitis   . Colitis, ulcerative ([place])   . Cataracts, bilateral   . Pollen allergies   . DJD (degenerative joint disease)   . Psoriasis   . Hypokalemia   . AKI (acute kidney injury) ([place]) 12/2015    Past Surgical History  Procedure Laterality Date  . Colostomy    . Ileostomy    . Back surgery    . Tubal ligation    . 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 Knee arthroplasty Left 12/14/2015    Procedure: COMPUTER ASSISTED LEFT TOTAL KNEE ARTHROPLASTY;  Surgeon: Jessy Oto, MD;  Location: [place];  Service: Orthopedics;  Laterality: Left;    There were no vitals filed for this visit.      Subjective Assessment - 01/26/16 1510    Subjective Back 8/10.  Knee is OK,  He stretched the ach right out of it.  I am walking better.  Motion is improving, ankle less sore.   Currently in Pain? No/denies   Pain Score 0-No pain   Pain Location Knee   Pain Orientation Left   Pain Descriptors / Indicators --  pain   Pain Frequency Intermittent   Aggravating Factors  siting too long   Pain Relieving Factors stretching            [facility] PT Assessment - 01/26/16 0001    PROM   Left Knee Flexion 105                     [facility] Adult PT Treatment/Exercise - 01/26/16 0001    Ambulation/Gait   Gait Comments 4 inch steps, step to and step over step,  compensations and use of rails.     Knee/Hip Exercises: Stretches   Passive Hamstring Stretch 3 reps;30 seconds   Quad Stretch 3 reps;30 seconds   Quad Stretch Limitations Also, step stretch 10 X 10 seconds, standing   Knee/Hip Exercises: Aerobic   Nustep L4 5 minutes  arms legs   Knee/Hip Exercises: Standing   Functional Squat 1 set;10 reps   Knee/Hip Exercises: Seated   Long Arc Quad 3 sets;10 reps  4 pounds   Hamstring Curl 10 reps;3 sets  red band   Knee/Hip Exercises: Supine   Heel Slides 1 set;10 reps  with end range stretch by PTA,   monitoring for pain   Cryotherapy   Number Minutes Cryotherapy 10 Minutes   Cryotherapy Location Knee   Type of Cryotherapy --  cold pack                  PT Short Term Goals - 01/26/16 1600    PT SHORT TERM GOAL #1   Title pt will be I with inital HEP  Time 4   Period Weeks   Status On-going   PT SHORT TERM GOAL #2   Title -Pt will increase L knee extension by 5 degrees   Baseline -5 degrees    Time 4   Period Weeks   Status On-going   PT SHORT TERM GOAL #3   Title Patient will ambulate 200' w/ no device with proper gait technique and > 2/10 pain    Baseline some 5/10 pain with walking ,  distance not assessed   Time 6   Period Weeks   Status On-going   PT SHORT TERM GOAL #4   Title Pt will increase Lleftsingle leg stance time to 5 seconds   Time 4   Period Weeks   Status Unable to assess   PT SHORT TERM GOAL #5   Title Pt will increase gross left lower extremity stregth to 4+/5    Period Weeks   Status On-going           PT Long Term Goals - 01/12/16 1535    PT LONG TERM GOAL #1   Title Pt will go up/down 8 steps with reciprocol gait pattern    Time 8   Period Weeks   Status New   PT LONG TERM GOAL #2   Title Pt will bend to pick object off the ground without incresased pain    Time 8   Period Weeks   Status New   PT LONG TERM GOAL #3    Title Pt will demonstrate 115 degrees of left knee flexion in order to get up and down out of a low chair without difficulty   Time 8   Period Weeks   Status New               Plan - 01/26/16 1556    Clinical Impression Statement Back pain improved from 8/10 to 6/10, knee pain improved from 4/10 to 3/10 post exercises.  She is able to ascend and descend step over step with rails and deviations with 4 inch steps.     PT Next Visit Plan continue step ups, continue stretching and strengthening,  tape for swelling.    PT Home Exercise Plan continue      Patient will benefit from skilled therapeutic intervention in order to improve the following deficits and impairments:  Abnormal gait, Decreased activity tolerance, Decreased balance, Decreased mobility, Decreased range of motion, Decreased scar mobility, Decreased strength, Difficulty walking, Hypermobility, Pain  Visit Diagnosis: Pain in left knee  Stiffness of left knee, not elsewhere classified  Muscle weakness (generalized)  Difficulty in walking, not elsewhere classified     Problem List Patient Active Problem List   Diagnosis Date Noted  . Osteoarthritis of left knee 12/14/2015    Class: End Stage  . Localized osteoarthritis of left knee 12/14/2015  . Small bowel obstruction, partial ([place]) 08/30/2013  . Acute renal failure ([place]) 08/30/2013  . 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 N&V (nausea and vomiting) 08/30/2013  . Abdominal pain, acute 08/30/2013  . SBO (small bowel obstruction) ([place]) 08/30/2013  . Hypokalemia 08/30/2013  . Partial small bowel obstruction ([place]) 08/30/2013  . Postmenopausal bleeding 03/18/2013  . OSTEOARTHRITIS, KNEE, LEFT 03/20/2010  . TRICUSPID REGURGITATION, MILD 03/03/2010  . RENAL INSUFFICIENCY 03/02/2010  . MORBID OBESITY 02/18/2010  . PREMATURE ATRIAL CONTRACTIONS 02/18/2010  . PREMATURE VENTRICULAR CONTRACTIONS 02/18/2010  . CARDIAC MURMUR 02/18/2010  . EDEMA 10/20/2009  .  HYPOKALEMIA 07/30/2009  . RIB PAIN, RIGHT SIDED  05/26/2008  . ABDOMINAL PAIN, LEFT LOWER QUADRANT 12/31/2007  . ALLERGIC RHINITIS 06/25/2007  . ABNORMAL BLOOD CHEMISTRY , OTHER 06/25/2007  . UNSPECIFIED VITAMIN D DEFICIENCY 04/30/2007  . HYPOCALCEMIA 04/19/2007  . GLAUCOMA NEC 04/19/2007  . HYPERTENSION, ESSENTIAL NOS 04/19/2007  . ASTHMA 04/19/2007  . COLITIS, ULCERATIVE NOS 04/19/2007  . DISORDER, MENSTRUAL NEC 04/19/2007  . PSORIASIS 04/19/2007  . 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 GOUT NOS 02/21/2006    Jackie Parker 01/26/2016, 4:02 PM  [facility] [address] [place], [place], [address] Phone: [phone]   Fax:  [phone]  Name: Jackie Parker MRN: [id number] Date of Birth: 1940/09/23    Melvenia Needles, PTA 01/26/2016 4:02 PM Phone: [phone] Fax: [phone]

## 2016-01-28 ENCOUNTER — Ambulatory Visit: Payer: Medicare Other | Admitting: Physical Therapy

## 2016-01-28 DIAGNOSIS — M6281 Muscle weakness (generalized): Secondary | ICD-10-CM

## 2016-01-28 DIAGNOSIS — R262 Difficulty in walking, not elsewhere classified: Secondary | ICD-10-CM

## 2016-01-28 DIAGNOSIS — M25662 Stiffness of left knee, not elsewhere classified: Secondary | ICD-10-CM

## 2016-01-28 DIAGNOSIS — M25562 Pain in left knee: Secondary | ICD-10-CM

## 2016-01-28 NOTE — Therapy (Signed)
[place], [place], [address] Phone: [phone]   Fax:  [phone]  Physical Therapy Treatment  Patient Details  Name: Jackie Parker MRN: [id number] Date of Birth: 06-23-1941 Referring Provider: Dr. Louanne Skye   Encounter Date: 01/28/2016      PT End of Session - 01/28/16 1634    Visit Number 5   Number of Visits 16   Date for PT Re-Evaluation 03/08/16   Authorization Type medicare    PT Start Time 1545   PT Stop Time 1629   PT Time Calculation (min) 44 min   Activity Tolerance Patient tolerated treatment well   Behavior During Therapy [facility] for tasks assessed/performed      Past Medical History  Diagnosis Date  . Asthma   . Hypertension   . Bronchitis   . Colitis, ulcerative ([place])   . Cataracts, bilateral   . Pollen allergies   . DJD (degenerative joint disease)   . Psoriasis   . Hypokalemia   . AKI (acute kidney injury) ([place]) 12/2015    Past Surgical History  Procedure Laterality Date  . Colostomy    . Ileostomy    . Back surgery    . Tubal ligation    . 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 Knee arthroplasty Left 12/14/2015    Procedure: COMPUTER ASSISTED LEFT TOTAL KNEE ARTHROPLASTY;  Surgeon: Jessy Oto, MD;  Location: [place];  Service: Orthopedics;  Laterality: Left;    There were no vitals filed for this visit.      Subjective Assessment - 01/28/16 1508    Subjective "my R knee is feeling more sore today reporting it s a 6/10 and the L knee is 1/10"   Currently in Pain? Yes   Pain Score 1    Pain Location Knee   Pain Orientation Left   Pain Descriptors / Indicators Sore   Pain Type Surgical pain   Pain Onset More than a month ago   Pain Frequency Intermittent                         [facility] Adult PT Treatment/Exercise - 01/28/16 0001    Knee/Hip Exercises: Aerobic   Nustep L4 x 5 minutes  use LE only   Knee/Hip Exercises: Standing   Functional  Squat 1 set;10 reps   Gait Training 2 x 20 ft with exaggerated heel strike and toe off    Knee/Hip Exercises: Seated   Stool Scoot - Round Trips 3 x 10 ft  cues to use both feet equally   Sit to Sand 2 sets;10 reps   Knee/Hip Exercises: Supine   Straight Leg Raises AROM;Strengthening;10 reps;2 sets   Manual Therapy   Manual Therapy Taping   Kinesiotex Create Space;Edema   Kinesiotix   Edema over L anterior knee                 PT Education - 01/28/16 1633    Education provided Yes   Education Details educated about KT taping and benefits for swelling, updated HEP for knee bending on step   Person(s) Educated Patient   Methods Explanation;Handout   Comprehension Verbalized understanding          PT Short Term Goals - 01/26/16 1600    PT SHORT TERM GOAL #1   Title pt will be I with inital HEP    Time 4   Period Weeks   Status On-going  PT SHORT TERM GOAL #2   Title -Pt will increase L knee extension by 5 degrees   Baseline -5 degrees    Time 4   Period Weeks   Status On-going   PT SHORT TERM GOAL #3   Title Patient will ambulate 200' w/ no device with proper gait technique and > 2/10 pain    Baseline some 5/10 pain with walking ,  distance not assessed   Time 6   Period Weeks   Status On-going   PT SHORT TERM GOAL #4   Title Pt will increase Lleftsingle leg stance time to 5 seconds   Time 4   Period Weeks   Status Unable to assess   PT SHORT TERM GOAL #5   Title Pt will increase gross left lower extremity stregth to 4+/5    Period Weeks   Status On-going           PT Long Term Goals - 01/12/16 1535    PT LONG TERM GOAL #1   Title Pt will go up/down 8 steps with reciprocol gait pattern    Time 8   Period Weeks   Status New   PT LONG TERM GOAL #2   Title Pt will bend to pick object off the ground without incresased pain    Time 8   Period Weeks   Status New   PT LONG TERM GOAL #3   Title Pt will demonstrate 115 degrees of left knee flexion  in order to get up and down out of a low chair without difficulty   Time 8   Period Weeks   Status New               Plan - 01/28/16 1638    Clinical Impression Statement [name] reports increased pain the R knee today. applied KT edema reduction tape to the L knee which she reported it felt good after. she continues to require cues for gait with heel strike and toe off. she was able to perform all exercises given today stating she felt no pain after. pt declined modalities post session.    PT Next Visit Plan assess response to KT tape, continue step ups, continue stretching and strengthening   PT Home Exercise Plan quad stretches with foot on step   Consulted and Agree with Plan of Care Patient      Patient will benefit from skilled therapeutic intervention in order to improve the following deficits and impairments:  Abnormal gait, Decreased activity tolerance, Decreased balance, Decreased mobility, Decreased range of motion, Decreased scar mobility, Decreased strength, Difficulty walking, Hypermobility, Pain  Visit Diagnosis: Pain in left knee  Stiffness of left knee, not elsewhere classified  Muscle weakness (generalized)  Difficulty in walking, not elsewhere classified     Problem List Patient Active Problem List   Diagnosis Date Noted  . Osteoarthritis of left knee 12/14/2015    Class: End Stage  . Localized osteoarthritis of left knee 12/14/2015  . Small bowel obstruction, partial ([place]) 08/30/2013  . Acute renal failure ([place]) 08/30/2013  . 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 N&V (nausea and vomiting) 08/30/2013  . Abdominal pain, acute 08/30/2013  . SBO (small bowel obstruction) ([place]) 08/30/2013  . Hypokalemia 08/30/2013  . Partial small bowel obstruction ([place]) 08/30/2013  . Postmenopausal bleeding 03/18/2013  . OSTEOARTHRITIS, KNEE, LEFT 03/20/2010  . TRICUSPID REGURGITATION, MILD 03/03/2010  . RENAL INSUFFICIENCY 03/02/2010  . MORBID OBESITY  02/18/2010  . PREMATURE ATRIAL CONTRACTIONS 02/18/2010  .  PREMATURE VENTRICULAR CONTRACTIONS 02/18/2010  . CARDIAC MURMUR 02/18/2010  . EDEMA 10/20/2009  . HYPOKALEMIA 07/30/2009  . RIB PAIN, RIGHT SIDED 05/26/2008  . ABDOMINAL PAIN, LEFT LOWER QUADRANT 12/31/2007  . ALLERGIC RHINITIS 06/25/2007  . ABNORMAL BLOOD CHEMISTRY , OTHER 06/25/2007  . UNSPECIFIED VITAMIN D DEFICIENCY 04/30/2007  . HYPOCALCEMIA 04/19/2007  . GLAUCOMA NEC 04/19/2007  . HYPERTENSION, ESSENTIAL NOS 04/19/2007  . ASTHMA 04/19/2007  . COLITIS, ULCERATIVE NOS 04/19/2007  . DISORDER, MENSTRUAL NEC 04/19/2007  . PSORIASIS 04/19/2007  . 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 GOUT NOS 02/21/2006   [place]arr [place] PT, DPT, LAT, ATC  01/28/2016  4:46 PM      [place] [facility] [address] [place], [place], [address] Phone: [phone]   Fax:  [phone]  Name: Jackie Parker MRN: [id number] Date of Birth: 11/20/1940

## 2016-02-02 ENCOUNTER — Other Ambulatory Visit: Payer: Self-pay | Admitting: Specialist

## 2016-02-02 DIAGNOSIS — R609 Edema, unspecified: Secondary | ICD-10-CM

## 2016-02-02 DIAGNOSIS — M25562 Pain in left knee: Secondary | ICD-10-CM

## 2016-02-03 ENCOUNTER — Ambulatory Visit: Payer: Medicare Other | Admitting: Physical Therapy

## 2016-02-03 DIAGNOSIS — R262 Difficulty in walking, not elsewhere classified: Secondary | ICD-10-CM

## 2016-02-03 DIAGNOSIS — M25562 Pain in left knee: Secondary | ICD-10-CM

## 2016-02-03 DIAGNOSIS — M6281 Muscle weakness (generalized): Secondary | ICD-10-CM

## 2016-02-03 DIAGNOSIS — M25662 Stiffness of left knee, not elsewhere classified: Secondary | ICD-10-CM

## 2016-02-03 NOTE — Therapy (Signed)
[place], [place], [address] Phone: [phone]   Fax:  [phone]  Physical Therapy Treatment  Patient Details  Name: Jackie Parker MRN: [id number] Date of Birth: 07-26-1941 Referring Provider: Dr. Louanne Skye   Encounter Date: 02/03/2016      PT End of Session - 02/03/16 1743    Visit Number 6   Number of Visits 16   Date for PT Re-Evaluation 03/08/16   PT Start Time 1550   PT Stop Time 1630   PT Time Calculation (min) 40 min   Activity Tolerance Patient tolerated treatment well;No increased pain   Behavior During Therapy [facility] for tasks assessed/performed      Past Medical History  Diagnosis Date  . Asthma   . Hypertension   . Bronchitis   . Colitis, ulcerative ([place])   . Cataracts, bilateral   . Pollen allergies   . DJD (degenerative joint disease)   . Psoriasis   . Hypokalemia   . AKI (acute kidney injury) ([place]) 12/2015    Past Surgical History  Procedure Laterality Date  . Colostomy    . Ileostomy    . Back surgery    . Tubal ligation    . 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 Knee arthroplasty Left 12/14/2015    Procedure: COMPUTER ASSISTED LEFT TOTAL KNEE ARTHROPLASTY;  Surgeon: Jessy Oto, MD;  Location: [place];  Service: Orthopedics;  Laterality: Left;    There were no vitals filed for this visit.      Subjective Assessment - 02/03/16 1739    Subjective I had my knee tested for blood clots ,  I don't have any.  the Dr removed my stich and the shin pain is gone.  The tape came off , It helped.    Currently in Pain? Yes   Pain Score 1    Pain Location Knee   Pain Orientation Left   Pain Descriptors / Indicators Sore   Pain Frequency Intermittent   Aggravating Factors  sitting too long   Pain Relieving Factors stretching, exercise   Multiple Pain Sites No            [facility] PT Assessment - 02/03/16 0001    Observation/Other Assessments-Edema    Edema  Circumferential  45.5 cm joint line   PROM   Left Knee Flexion 116  post manual                     [facility] Adult PT Treatment/Exercise - 02/03/16 0001    Ambulation/Gait   Ambulation/Gait Yes   Gait Comments cues level   Knee/Hip Exercises: Stretches   Sports administrator Limitations step stretch 10 x 10 seconds   Knee/Hip Exercises: Aerobic   Nustep L4 x 5 minutes  use LE only   Knee/Hip Exercises: Seated   Long Arc Quad 10 reps  10 second holds   Heel Slides 10 reps;2 sets   Hamstring Curl 10 reps  manually rseisted   Knee/Hip Exercises: Supine   Heel Slides 10 reps  AA   Patellar Mobs yes   Manual Therapy   Manual Therapy Edema management;Taping;Other (comment)   Manual therapy comments retrograde soft tissue work, starting at the groin,  patellar mobilizations,  tibia rotation with flexion   Edema Management yes,  Md's request per patient.  Had [place] today no clots in her legs.     Kinesiotex Create Space;Edema  also activate quads.  PT Short Term Goals - 02/03/16 1748    PT SHORT TERM GOAL #1   Title pt will be I with inital HEP    Baseline understands and is adherent,  achieved 02/03/2015   Time 4   Period Weeks   Status Achieved   PT SHORT TERM GOAL #2   Title -Pt will increase L knee extension by 5 degrees   Time 4   Period Weeks   Status Unable to assess   PT SHORT TERM GOAL #3   Title Patient will ambulate 200' w/ no device with proper gait technique and > 2/10 pain    Baseline able to walk short distances no pain   Time 6   Period Weeks   Status On-going   PT SHORT TERM GOAL #4   Title Pt will increase Lleftsingle leg stance time to 5 seconds   Time 4   Period Weeks   Status Unable to assess   PT SHORT TERM GOAL #5   Title Pt will increase gross left lower extremity stregth to 4+/5    Period Weeks   Status Unable to assess           PT Long Term Goals - 01/12/16 1535    PT LONG TERM GOAL #1   Title Pt will  go up/down 8 steps with reciprocol gait pattern    Time 8   Period Weeks   Status New   PT LONG TERM GOAL #2   Title Pt will bend to pick object off the ground without incresased pain    Time 8   Period Weeks   Status New   PT LONG TERM GOAL #3   Title Pt will demonstrate 115 degrees of left knee flexion in order to get up and down out of a low chair without difficulty   Time 8   Period Weeks   Status New               Plan - 02/03/16 1744    Clinical Impression Statement Manual most helpful with knee flexion to 114 AAROM,  No pain post session.  Tape helpful   PT Next Visit Plan continue manual, taping, step ups, stretches   PT Home Exercise Plan continue   Consulted and Agree with Plan of Care Patient      Patient will benefit from skilled therapeutic intervention in order to improve the following deficits and impairments:  Abnormal gait, Decreased activity tolerance, Decreased balance, Decreased mobility, Decreased range of motion, Decreased scar mobility, Decreased strength, Difficulty walking, Hypermobility, Pain  Visit Diagnosis: Pain in left knee  Stiffness of left knee, not elsewhere classified  Muscle weakness (generalized)  Difficulty in walking, not elsewhere classified     Problem List Patient Active Problem List   Diagnosis Date Noted  . Osteoarthritis of left knee 12/14/2015    Class: End Stage  . Localized osteoarthritis of left knee 12/14/2015  . Small bowel obstruction, partial ([place]) 08/30/2013  . Acute renal failure ([place]) 08/30/2013  . 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 N&V (nausea and vomiting) 08/30/2013  . Abdominal pain, acute 08/30/2013  . SBO (small bowel obstruction) ([place]) 08/30/2013  . Hypokalemia 08/30/2013  . Partial small bowel obstruction ([place]) 08/30/2013  . Postmenopausal bleeding 03/18/2013  . OSTEOARTHRITIS, KNEE, LEFT 03/20/2010  . TRICUSPID REGURGITATION, MILD 03/03/2010  . RENAL INSUFFICIENCY 03/02/2010  .  MORBID OBESITY 02/18/2010  . PREMATURE ATRIAL CONTRACTIONS 02/18/2010  . PREMATURE VENTRICULAR CONTRACTIONS 02/18/2010  .  CARDIAC MURMUR 02/18/2010  . EDEMA 10/20/2009  . HYPOKALEMIA 07/30/2009  . RIB PAIN, RIGHT SIDED 05/26/2008  . ABDOMINAL PAIN, LEFT LOWER QUADRANT 12/31/2007  . ALLERGIC RHINITIS 06/25/2007  . ABNORMAL BLOOD CHEMISTRY , OTHER 06/25/2007  . UNSPECIFIED VITAMIN D DEFICIENCY 04/30/2007  . HYPOCALCEMIA 04/19/2007  . GLAUCOMA NEC 04/19/2007  . HYPERTENSION, ESSENTIAL NOS 04/19/2007  . ASTHMA 04/19/2007  . COLITIS, ULCERATIVE NOS 04/19/2007  . DISORDER, MENSTRUAL NEC 04/19/2007  . PSORIASIS 04/19/2007  . 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 GOUT NOS 02/21/2006    HARRIS,KAREN 02/03/2016, 5:51 PM  [facility] [address] [place], [place], [address] Phone: [phone]   Fax:  [phone]  Name: Jackie Parker MRN: [id number] Date of Birth: 10/13/1940    Melvenia Needles, PTA 02/03/2016 5:51 PM Phone: [phone] Fax: [phone]

## 2016-02-10 ENCOUNTER — Ambulatory Visit: Payer: Medicare Other | Admitting: Physical Therapy

## 2016-02-10 DIAGNOSIS — M6281 Muscle weakness (generalized): Secondary | ICD-10-CM

## 2016-02-10 DIAGNOSIS — M25562 Pain in left knee: Secondary | ICD-10-CM

## 2016-02-10 DIAGNOSIS — M25662 Stiffness of left knee, not elsewhere classified: Secondary | ICD-10-CM

## 2016-02-10 DIAGNOSIS — R262 Difficulty in walking, not elsewhere classified: Secondary | ICD-10-CM

## 2016-02-10 NOTE — Therapy (Signed)
[place], [place], [address] Phone: [phone]   Fax:  [phone]  Physical Therapy Treatment  Patient Details  Name: Jackie Parker MRN: [id number] Date of Birth: 08/01/1941 Referring Provider: Dr. Louanne Parker   Encounter Date: 02/10/2016      PT End of Session - 02/10/16 1730    Visit Number 7   Number of Visits 16   Date for PT Re-Evaluation 03/08/16   Authorization Type medicare    PT Start Time 1504   PT Stop Time 1548   PT Time Calculation (min) 44 min   Activity Tolerance Patient tolerated treatment well   Behavior During Therapy [facility] for tasks assessed/performed      Past Medical History  Diagnosis Date  . Asthma   . Hypertension   . Bronchitis   . Colitis, ulcerative ([place])   . Cataracts, bilateral   . Pollen allergies   . DJD (degenerative joint disease)   . Psoriasis   . Hypokalemia   . AKI (acute kidney injury) ([place]) 12/2015    Past Surgical History  Procedure Laterality Date  . Colostomy    . Ileostomy    . Back surgery    . Tubal ligation    . 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 Knee arthroplasty Left 12/14/2015    Procedure: COMPUTER ASSISTED LEFT TOTAL KNEE ARTHROPLASTY;  Surgeon: Jackie Oto, MD;  Location: [place];  Service: Orthopedics;  Laterality: Left;    There were no vitals filed for this visit.      Subjective Assessment - 02/10/16 1515    Currently in Pain? Yes   Pain Score 0-No pain   Multiple Pain Sites Yes   Pain Score 4   Pain Location Knee   Pain Orientation Right   Pain Descriptors / Indicators Aching;Sore   Pain Type Chronic pain   Pain Onset More than a month ago   Aggravating Factors  walking/ standing   Pain Relieving Factors resting            [facility] PT Assessment - 02/10/16 0001    AROM   Left Knee Extension -4   Left Knee Flexion 108                     [facility] Adult PT Treatment/Exercise - 02/10/16 1518    Knee/Hip Exercises: Stretches   Other Knee/Hip Stretches sidelying knee flexion stretch 4 x 30 sec stretch   Knee/Hip Exercises: Aerobic   Nustep L5 x 10 min  LE only   Knee/Hip Exercises: Standing   Forward Step Up Hand Hold: 1;Step Height: 6"  leading with L and lowering with L   Functional Squat 1 set;10 reps   Gait Training 4 x 20 ft with equal weight shift and weight bearing on LE avoiding limping, stair training 5 x on 4 inch step, 4  x on 6 inch step   Knee/Hip Exercises: Seated   Long Arc Quad 15 reps  2 sets   Knee/Hip Exercises: Supine   Straight Leg Raises AROM;Strengthening;Left;2 sets;15 reps  verbal cues to push knee straight at end range                PT Education - 02/10/16 1729    Education provided Yes   Education Details proper gait pattern with equal weight bearing bil to reduce stress on R knee.    Person(s) Educated Patient   Methods Explanation;Demonstration;Verbal cues   Comprehension Verbalized understanding;Returned  demonstration;Verbal cues required          PT Short Term Goals - 02/03/16 1748    PT SHORT TERM GOAL #1   Title pt will be I with inital HEP    Baseline understands and is adherent,  achieved 02/03/2015   Time 4   Period Weeks   Status Achieved   PT SHORT TERM GOAL #2   Title -Pt will increase L knee extension by 5 degrees   Time 4   Period Weeks   Status Unable to assess   PT SHORT TERM GOAL #3   Title Patient will ambulate 200' w/ no device with proper gait technique and > 2/10 pain    Baseline able to walk short distances no pain   Time 6   Period Weeks   Status On-going   PT SHORT TERM GOAL #4   Title Pt will increase Lleftsingle leg stance time to 5 seconds   Time 4   Period Weeks   Status Unable to assess   PT SHORT TERM GOAL #5   Title Pt will increase gross left lower extremity stregth to 4+/5    Period Weeks   Status Unable to assess           PT Long Term Goals - 01/12/16 1535    PT LONG TERM GOAL  #1   Title Pt will go up/down 8 steps with reciprocol gait pattern    Time 8   Period Weeks   Status New   PT LONG TERM GOAL #2   Title Pt will bend to pick object off the ground without incresased pain    Time 8   Period Weeks   Status New   PT LONG TERM GOAL #3   Title Pt will demonstrate 115 degrees of left knee flexion in order to get up and down out of a low chair without difficulty   Time 8   Period Weeks   Status New               Plan - 02/10/16 1731    Clinical Impression Statement Mrs. Jackie Parker reported no pain the L knee but was having more pain in the R knee which she reported was 4/10. Following Nu-step and L knee strengthening practiced walking with equal weight shift and weight bearing through both LE which she reported pain dropped to a 2/10. practiced stairs which pt required verbal cues for confidence but was able to complete without diffculty. pt declined modalities post session.    PT Next Visit Plan gait training with equal weight bearing, manual for mobility, step-ups, stretching, Nu-Step   Consulted and Agree with Plan of Care Patient      Patient will benefit from skilled therapeutic intervention in order to improve the following deficits and impairments:  Abnormal gait, Decreased activity tolerance, Decreased balance, Decreased mobility, Decreased range of motion, Decreased scar mobility, Decreased strength, Difficulty walking, Hypermobility, Pain  Visit Diagnosis: Pain in left knee  Stiffness of left knee, not elsewhere classified  Muscle weakness (generalized)  Difficulty in walking, not elsewhere classified     Problem List Patient Active Problem List   Diagnosis Date Noted  . Osteoarthritis of left knee 12/14/2015    Class: End Stage  . Localized osteoarthritis of left knee 12/14/2015  . Small bowel obstruction, partial ([place]) 08/30/2013  . Acute renal failure ([place]) 08/30/2013  . 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 N&V (nausea and  vomiting) 08/30/2013  . Abdominal  pain, acute 08/30/2013  . SBO (small bowel obstruction) ([place]) 08/30/2013  . Hypokalemia 08/30/2013  . Partial small bowel obstruction ([place]) 08/30/2013  . Postmenopausal bleeding 03/18/2013  . OSTEOARTHRITIS, KNEE, LEFT 03/20/2010  . TRICUSPID REGURGITATION, MILD 03/03/2010  . RENAL INSUFFICIENCY 03/02/2010  . MORBID OBESITY 02/18/2010  . PREMATURE ATRIAL CONTRACTIONS 02/18/2010  . PREMATURE VENTRICULAR CONTRACTIONS 02/18/2010  . CARDIAC MURMUR 02/18/2010  . EDEMA 10/20/2009  . HYPOKALEMIA 07/30/2009  . RIB PAIN, RIGHT SIDED 05/26/2008  . ABDOMINAL PAIN, LEFT LOWER QUADRANT 12/31/2007  . ALLERGIC RHINITIS 06/25/2007  . ABNORMAL BLOOD CHEMISTRY , OTHER 06/25/2007  . UNSPECIFIED VITAMIN D DEFICIENCY 04/30/2007  . HYPOCALCEMIA 04/19/2007  . GLAUCOMA NEC 04/19/2007  . HYPERTENSION, ESSENTIAL NOS 04/19/2007  . ASTHMA 04/19/2007  . COLITIS, ULCERATIVE NOS 04/19/2007  . DISORDER, MENSTRUAL NEC 04/19/2007  . PSORIASIS 04/19/2007  . 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 GOUT NOS 02/21/2006   Starr Lake PT, DPT, LAT, ATC  02/10/2016  5:38 PM      [facility] [address] [place], [place], [address] Phone: [phone]   Fax:  [phone]  Name: Jackie Parker MRN: [id number] Date of Birth: May 04, 1941

## 2016-02-16 ENCOUNTER — Ambulatory Visit: Payer: Medicare Other | Admitting: Physical Therapy

## 2016-02-18 ENCOUNTER — Ambulatory Visit: Payer: Medicare Other | Admitting: Physical Therapy

## 2016-02-23 ENCOUNTER — Ambulatory Visit: Payer: Medicare Other | Attending: Specialist | Admitting: Physical Therapy

## 2016-02-23 DIAGNOSIS — M25562 Pain in left knee: Secondary | ICD-10-CM | POA: Diagnosis present

## 2016-02-23 DIAGNOSIS — R262 Difficulty in walking, not elsewhere classified: Secondary | ICD-10-CM

## 2016-02-23 DIAGNOSIS — M25662 Stiffness of left knee, not elsewhere classified: Secondary | ICD-10-CM

## 2016-02-23 DIAGNOSIS — M6281 Muscle weakness (generalized): Secondary | ICD-10-CM

## 2016-02-23 NOTE — Therapy (Signed)
[place], [place], [address] Phone: [phone]   Fax:  [phone]  Physical Therapy Treatment  Patient Details  Name: Jackie Parker MRN: [id number] Date of Birth: 1941/04/05 Referring Provider: Dr. Louanne Skye   Encounter Date: 02/23/2016      PT End of Session - 02/23/16 1536    Visit Number 8   Number of Visits 16   Date for PT Re-Evaluation 03/08/16   Authorization Type medicare    PT Start Time 1503   PT Stop Time 1544   PT Time Calculation (min) 41 min   Activity Tolerance Patient tolerated treatment well   Behavior During Therapy [facility] for tasks assessed/performed      Past Medical History  Diagnosis Date  . Asthma   . Hypertension   . Bronchitis   . Colitis, ulcerative ([place])   . Cataracts, bilateral   . Pollen allergies   . DJD (degenerative joint disease)   . Psoriasis   . Hypokalemia   . AKI (acute kidney injury) ([place]) 12/2015    Past Surgical History  Procedure Laterality Date  . Colostomy    . Ileostomy    . Back surgery    . Tubal ligation    . 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 Knee arthroplasty Left 12/14/2015    Procedure: COMPUTER ASSISTED LEFT TOTAL KNEE ARTHROPLASTY;  Surgeon: Jessy Oto, MD;  Location: [place];  Service: Orthopedics;  Laterality: Left;    There were no vitals filed for this visit.      Subjective Assessment - 02/23/16 1509    Subjective "I am feeling alittle sore today in both knees"    Currently in Pain? Yes   Pain Score 4    Pain Location Knee   Pain Orientation Left;Lateral   Pain Descriptors / Indicators Sore;Numbness   Pain Type Chronic pain   Pain Onset More than a month ago   Pain Frequency Intermittent   Aggravating Factors  walking ont it and straigthening the knee   Pain Relieving Factors sitting and resting   Pain Score 3   Pain Location Knee   Pain Orientation Right   Pain Type Chronic pain   Pain Onset More than a month  ago   Pain Frequency Intermittent   Aggravating Factors  walking/ standing   Pain Relieving Factors sitting and resting                         [facility] Adult PT Treatment/Exercise - 02/23/16 1532    Knee/Hip Exercises: Aerobic   Nustep L5 x 5 min, L 7 x 5 Min  reported decreased pain today   Manual Therapy   Manual Therapy Joint mobilization;Soft tissue mobilization   Manual therapy comments manual trigger point release x 3 along distal L vastus lateralus   Joint Mobilization grade 2 patellar mobs in all planes   Soft tissue mobilization IASTM over lateral aspect of the knee and distal vastus lateralis                PT Education - 02/23/16 1537    Education provided Yes   Education Details when perofrming new exercises expect to be a little sore, and slowly progress with walking increasing walking time by a couple minutes every week to get body acclimated to walking to avoid soreness.    Person(s) Educated Patient   Methods Explanation;Verbal cues   Comprehension Verbalized understanding  PT Short Term Goals - 02/03/16 1748    PT SHORT TERM GOAL #1   Title pt will be I with inital HEP    Baseline understands and is adherent,  achieved 02/03/2015   Time 4   Period Weeks   Status Achieved   PT SHORT TERM GOAL #2   Title -Pt will increase L knee extension by 5 degrees   Time 4   Period Weeks   Status Unable to assess   PT SHORT TERM GOAL #3   Title Patient will ambulate 200' w/ no device with proper gait technique and > 2/10 pain    Baseline able to walk short distances no pain   Time 6   Period Weeks   Status On-going   PT SHORT TERM GOAL #4   Title Pt will increase Lleftsingle leg stance time to 5 seconds   Time 4   Period Weeks   Status Unable to assess   PT SHORT TERM GOAL #5   Title Pt will increase gross left lower extremity stregth to 4+/5    Period Weeks   Status Unable to assess           PT Long Term Goals - 01/12/16  1535    PT LONG TERM GOAL #1   Title Pt will go up/down 8 steps with reciprocol gait pattern    Time 8   Period Weeks   Status New   PT LONG TERM GOAL #2   Title Pt will bend to pick object off the ground without incresased pain    Time 8   Period Weeks   Status New   PT LONG TERM GOAL #3   Title Pt will demonstrate 115 degrees of left knee flexion in order to get up and down out of a low chair without difficulty   Time 8   Period Weeks   Status New               Plan - 02/23/16 1536    Clinical Impression Statement Jackie Parker initially reported 4/10 pain in bil knees. Following manual trigger point release and IASTM of vastus lateralis she reported pain to 0/10. she was able to perform all exercises without compalin of pain. Plan to reassess goals and ROM next session.    PT Next Visit Plan gait training with equal weight bearing, manual for mobility, step-ups, stretching, Nu-Step, manual as needed, Reassess ROM, strength   PT Home Exercise Plan progressing slowly with walking to decrease muscles from getting too sore   Consulted and Agree with Plan of Care Patient      Patient will benefit from skilled therapeutic intervention in order to improve the following deficits and impairments:     Visit Diagnosis: Pain in left knee  Stiffness of left knee, not elsewhere classified  Muscle weakness (generalized)  Difficulty in walking, not elsewhere classified     Problem List Patient Active Problem List   Diagnosis Date Noted  . Osteoarthritis of left knee 12/14/2015    Class: End Stage  . Localized osteoarthritis of left knee 12/14/2015  . Small bowel obstruction, partial ([place]) 08/30/2013  . Acute renal failure ([place]) 08/30/2013  . 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 N&V (nausea and vomiting) 08/30/2013  . Abdominal pain, acute 08/30/2013  . SBO (small bowel obstruction) ([place]) 08/30/2013  . Hypokalemia 08/30/2013  . Partial small bowel obstruction ([place])  08/30/2013  . Postmenopausal bleeding 03/18/2013  . OSTEOARTHRITIS, KNEE, LEFT 03/20/2010  .  TRICUSPID REGURGITATION, MILD 03/03/2010  . RENAL INSUFFICIENCY 03/02/2010  . MORBID OBESITY 02/18/2010  . PREMATURE ATRIAL CONTRACTIONS 02/18/2010  . PREMATURE VENTRICULAR CONTRACTIONS 02/18/2010  . CARDIAC MURMUR 02/18/2010  . EDEMA 10/20/2009  . HYPOKALEMIA 07/30/2009  . RIB PAIN, RIGHT SIDED 05/26/2008  . ABDOMINAL PAIN, LEFT LOWER QUADRANT 12/31/2007  . ALLERGIC RHINITIS 06/25/2007  . ABNORMAL BLOOD CHEMISTRY , OTHER 06/25/2007  . UNSPECIFIED VITAMIN D DEFICIENCY 04/30/2007  . HYPOCALCEMIA 04/19/2007  . GLAUCOMA NEC 04/19/2007  . HYPERTENSION, ESSENTIAL NOS 04/19/2007  . ASTHMA 04/19/2007  . COLITIS, ULCERATIVE NOS 04/19/2007  . DISORDER, MENSTRUAL NEC 04/19/2007  . PSORIASIS 04/19/2007  . 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 GOUT NOS 02/21/2006   Starr Lake PT, DPT, LAT, ATC  02/23/2016  3:48 PM      [place] [facility] [address] [place], [place], [address] Phone: [phone]   Fax:  [phone]  Name: Jackie Parker MRN: [id number] Date of Birth: [birth year]

## 2016-02-25 ENCOUNTER — Ambulatory Visit: Payer: Medicare Other | Admitting: Physical Therapy

## 2016-02-25 DIAGNOSIS — M25562 Pain in left knee: Secondary | ICD-10-CM | POA: Diagnosis not present

## 2016-02-25 DIAGNOSIS — M25662 Stiffness of left knee, not elsewhere classified: Secondary | ICD-10-CM

## 2016-02-25 DIAGNOSIS — M6281 Muscle weakness (generalized): Secondary | ICD-10-CM

## 2016-02-25 DIAGNOSIS — R262 Difficulty in walking, not elsewhere classified: Secondary | ICD-10-CM

## 2016-02-25 NOTE — Therapy (Signed)
[place], [place], [address] Phone: [phone]   Fax:  [phone]  Physical Therapy Treatment  Patient Details  Name: Jackie Parker MRN: [id number] Date of Birth: 04-Nov-1940 Referring Provider: Dr. Louanne Skye   Encounter Date: 02/25/2016      PT End of Session - 02/25/16 1809    Visit Number 9   Number of Visits 16   Date for PT Re-Evaluation 03/08/16   PT Start Time 1504   PT Stop Time 1545   PT Time Calculation (min) 41 min   Activity Tolerance Patient tolerated treatment well   Behavior During Therapy [facility] for tasks assessed/performed      Past Medical History  Diagnosis Date  . Asthma   . Hypertension   . Bronchitis   . Colitis, ulcerative ([place])   . Cataracts, bilateral   . Pollen allergies   . DJD (degenerative joint disease)   . Psoriasis   . Hypokalemia   . AKI (acute kidney injury) ([place]) 12/2015    Past Surgical History  Procedure Laterality Date  . Colostomy    . Ileostomy    . Back surgery    . Tubal ligation    . 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 Knee arthroplasty Left 12/14/2015    Procedure: COMPUTER ASSISTED LEFT TOTAL KNEE ARTHROPLASTY;  Surgeon: Jessy Oto, MD;  Location: [place];  Service: Orthopedics;  Laterality: Left;    There were no vitals filed for this visit.      Subjective Assessment - 02/25/16 1804    Currently in Pain? No/denies   Pain Location Knee            [facility] PT Assessment - 02/25/16 0001    AROM   Left Knee Extension -4  after manual   Left Knee Flexion 108   PROM   Left Knee Flexion 116  post manual                     [facility] Adult PT Treatment/Exercise - 02/25/16 0001    Knee/Hip Exercises: Stretches   Other Knee/Hip Stretches supine with roll under heel,  with quad sets .     Knee/Hip Exercises: Aerobic   Nustep L5 X 6 minutes   Knee/Hip Exercises: Machines for Strengthening   Cybex Leg Press 2 and .1 leg,   3-setrs 1-2 plates   Knee/Hip Exercises: Standing   SLS 3 seconds   Knee/Hip Exercises: Supine   Quad Sets 10 reps  with patellar glides   Heel Slides 10 reps   Patellar Mobs yes   Manual Therapy   Manual therapy comments instrument assist with lateral knee soft tissue work,  stiffness softened.    PROM both flexion and extension, gentle                  PT Short Term Goals - 02/25/16 1812    PT SHORT TERM GOAL #1   Title pt will be I with inital HEP    Status Achieved   PT SHORT TERM GOAL #2   Title -Pt will increase L knee extension by 5 degrees   Baseline improved 2 degrees from eval   Time 4   Period Weeks   Status On-going   PT SHORT TERM GOAL #3   Title Patient will ambulate 200' w/ no device with proper gait technique and > 2/10 pain    Baseline Patient says she can ,  not checked in  clinic   Time 6   Period Weeks   Status Partially Met   PT SHORT TERM GOAL #4   Title Pt will increase Lleftsingle leg stance time to 5 seconds   Baseline 2 seconds   Time 4   Period Weeks   Status On-going   PT SHORT TERM GOAL #5   Title Pt will increase gross left lower extremity stregth to 4+/5    Period Weeks   Status Unable to assess           PT Long Term Goals - 01/12/16 1535    PT LONG TERM GOAL #1   Title Pt will go up/down 8 steps with reciprocol gait pattern    Time 8   Period Weeks   Status New   PT LONG TERM GOAL #2   Title Pt will bend to pick object off the ground without incresased pain    Time 8   Period Weeks   Status New   PT LONG TERM GOAL #3   Title Pt will demonstrate 115 degrees of left knee flexion in order to get up and down out of a low chair without difficulty   Time 8   Period Weeks   Status New               Plan - 02/25/16 1810    Clinical Impression Statement No pain at end of session. Patient able to continue to improve in her ROM.  manual helpful again today.  STG# 3 partially met.   PT Next Visit Plan gait  training with equal weight bearing, manual for mobility, step-ups, stretching, Nu-Step, manual as needed, Reassess ROM, strength.  Needs FOTO/ G- code next visit.   PT Home Exercise Plan continue   Consulted and Agree with Plan of Care Patient      Patient will benefit from skilled therapeutic intervention in order to improve the following deficits and impairments:  Abnormal gait, Decreased activity tolerance, Decreased balance, Decreased mobility, Decreased range of motion, Decreased scar mobility, Decreased strength, Difficulty walking, Hypermobility, Pain  Visit Diagnosis: Pain in left knee  Stiffness of left knee, not elsewhere classified  Muscle weakness (generalized)  Difficulty in walking, not elsewhere classified     Problem List Patient Active Problem List   Diagnosis Date Noted  . Osteoarthritis of left knee 12/14/2015    Class: End Stage  . Localized osteoarthritis of left knee 12/14/2015  . Small bowel obstruction, partial ([place]) 08/30/2013  . Acute renal failure ([place]) 08/30/2013  . 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 N&V (nausea and vomiting) 08/30/2013  . Abdominal pain, acute 08/30/2013  . SBO (small bowel obstruction) ([place]) 08/30/2013  . Hypokalemia 08/30/2013  . Partial small bowel obstruction ([place]) 08/30/2013  . Postmenopausal bleeding 03/18/2013  . OSTEOARTHRITIS, KNEE, LEFT 03/20/2010  . TRICUSPID REGURGITATION, MILD 03/03/2010  . RENAL INSUFFICIENCY 03/02/2010  . MORBID OBESITY 02/18/2010  . PREMATURE ATRIAL CONTRACTIONS 02/18/2010  . PREMATURE VENTRICULAR CONTRACTIONS 02/18/2010  . CARDIAC MURMUR 02/18/2010  . EDEMA 10/20/2009  . HYPOKALEMIA 07/30/2009  . RIB PAIN, RIGHT SIDED 05/26/2008  . ABDOMINAL PAIN, LEFT LOWER QUADRANT 12/31/2007  . ALLERGIC RHINITIS 06/25/2007  . ABNORMAL BLOOD CHEMISTRY , OTHER 06/25/2007  . UNSPECIFIED VITAMIN D DEFICIENCY 04/30/2007  . HYPOCALCEMIA 04/19/2007  . GLAUCOMA NEC 04/19/2007  . HYPERTENSION, ESSENTIAL  NOS 04/19/2007  . ASTHMA 04/19/2007  . COLITIS, ULCERATIVE NOS 04/19/2007  . DISORDER, MENSTRUAL NEC 04/19/2007  . PSORIASIS 04/19/2007  . DISORDER NEC/NOS,  LUMBAR DISC 04/19/2007  . GOUT NOS 02/21/2006    HARRIS,KAREN 02/25/2016, 6:14 PM  [facility] [address] [place], [place], [address] Phone: [phone]   Fax:  [phone]  Name: Jackie Parker MRN: [id number] Date of Birth: Jul 05, 1941    Melvenia Needles, PTA 02/25/2016 6:14 PM Phone: [phone] Fax: [phone]

## 2016-03-08 ENCOUNTER — Ambulatory Visit: Payer: Medicare Other | Admitting: Physical Therapy

## 2016-03-08 DIAGNOSIS — M25562 Pain in left knee: Secondary | ICD-10-CM

## 2016-03-08 DIAGNOSIS — R262 Difficulty in walking, not elsewhere classified: Secondary | ICD-10-CM

## 2016-03-08 DIAGNOSIS — M25662 Stiffness of left knee, not elsewhere classified: Secondary | ICD-10-CM

## 2016-03-08 DIAGNOSIS — M6281 Muscle weakness (generalized): Secondary | ICD-10-CM

## 2016-03-08 NOTE — Patient Instructions (Signed)
Tandem Stance    Right foot in front of left, heel touching toe both feet "straight ahead". Stand on Foot Triangle of Support with both feet. Balance in this position _30-60__ seconds.  Single Leg - Eyes Open    Holding support with ONE FINGER, lift right leg while maintaining balance over other leg. Progress to removing hands from support surface for longer periods of time. Hold_30-60___ seconds. Repeat ___1_ times per session. Do __2__ sessions per day.  Copyright  VHI. All rights reserved.

## 2016-03-08 NOTE — Therapy (Signed)
[place], [place], [address] Phone: [phone]   Fax:  [phone]  Physical Therapy Treatment/ Re-certification note  Patient Details  Name: Jackie Parker MRN: [id number] Date of Birth: 08-25-1941 Referring Provider: Dr. Louanne Skye   Encounter Date: 03/08/2016      PT End of Session - 03/08/16 1448    Visit Number 10   Number of Visits 16   Date for PT Re-Evaluation 04/12/16   Authorization Type medicare    PT Start Time 0215   PT Stop Time 0259   PT Time Calculation (min) 44 min      Past Medical History  Diagnosis Date  . Asthma   . Hypertension   . Bronchitis   . Colitis, ulcerative ([place])   . Cataracts, bilateral   . Pollen allergies   . DJD (degenerative joint disease)   . Psoriasis   . Hypokalemia   . AKI (acute kidney injury) ([place]) 12/2015    Past Surgical History  Procedure Laterality Date  . Colostomy    . Ileostomy    . Back surgery    . Tubal ligation    . 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 Knee arthroplasty Left 12/14/2015    Procedure: COMPUTER ASSISTED LEFT TOTAL KNEE ARTHROPLASTY;  Surgeon: Jessy Oto, MD;  Location: [facility];  Service: Orthopedics;  Laterality: Left;    There were no vitals filed for this visit.      Subjective Assessment - 03/08/16 1532    Subjective No pain left knee, right knee 3/10             [facility] PT Assessment - 03/08/16 0001    ROM / Strength   AROM / PROM / Strength Strength   AROM   Left Knee Extension -4   Left Knee Flexion 115   Strength   Right Hip ABduction 5/5   Left Hip Flexion 5/5   Left Knee Flexion 4+/5   Left Knee Extension 5/5                     [facility] Adult PT Treatment/Exercise - 03/08/16 0001    Knee/Hip Exercises: Aerobic   Nustep L5 X 6 minutes   Knee/Hip Exercises: Standing   Forward Step Up Hand Hold: 1;Step Height: 6"  leading with L and lowering with L   Step Down 5 reps;Hand Hold:  1;Step Height: 6"   SLS with Vectors 3 way hip sls on left x10 each   Gait Training 150 ft working on increased step length and heel strike bilateral withou AD                PT Education - 03/08/16 1457    Education provided Yes   Education Details Tandem, SLS with 1 finger    Person(s) Educated Patient   Methods Explanation;Handout   Comprehension Verbalized understanding          PT Short Term Goals - 03/08/16 1524    PT SHORT TERM GOAL #1   Title pt will be I with inital HEP    Status Achieved   PT SHORT TERM GOAL #2   Title -Pt will increase L knee extension by 5 degrees   Time 4   Period Weeks   Status Achieved   PT SHORT TERM GOAL #3   Title Patient will ambulate 200' w/ no device with proper gait technique and > 2/10 pain    Baseline has not tried  without cane in community. No cane in home   Status On-going   PT SHORT TERM GOAL #4   Title Pt will increase Lleftsingle leg stance time to 5 seconds   Baseline 2 seconds   Time 4   Period Weeks   Status On-going   PT SHORT TERM GOAL #5   Title Pt will increase gross left lower extremity stregth to 4+/5    Period Weeks   Status Achieved           PT Long Term Goals - 03-21-16 1447    PT LONG TERM GOAL #1   Title Pt will go up/down 8 steps with reciprocol gait pattern    Time 8   Period Weeks   Status Achieved   PT LONG TERM GOAL #2   Title Pt will bend to pick object off the ground without incresased pain    Time 8   Period Weeks   Status On-going   PT LONG TERM GOAL #3   Title Pt will demonstrate 115 degrees of left knee flexion in order to get up and down out of a low chair without difficulty   Time 8   Period Weeks   Status Achieved   PT LONG TERM GOAL #4   Title pt will increase her FOTO score to >/= 50 to demonstrate improvement in function at discharge (09/23/2015)   Baseline no initial FOTO score   Status Deferred               Plan - 03-21-2016 1525    Clinical Impression  Statement Pt reports no pain in left knee. Right knee is limiting and she plans to have it replaced next year. She continues to ambulate in community with [facility] Her SLS is 2 seconds at best on LLE. Instructed pt in UE support SLS and tandem stancce for HEP. Worked on steps with pt reporting right knee pain increased and prefers to ambulate with step to pattern. STG #2, #5. LTG#1, #3 Met.    PT Frequency 2x / week   PT Duration --  5 weeks   PT Treatment/Interventions ADLs/Self Care Home Management;Electrical Stimulation;Cryotherapy;Traction;Moist Heat;Ultrasound;Stair training;Functional mobility training;Therapeutic activities;Therapeutic exercise;Balance training;Neuromuscular re-education;Patient/family education;Manual techniques;Dry needling;Passive range of motion   PT Next Visit Plan gait training without SPC, high level balance, dynamic balance, closed chain as tolerated (right knee to be replaced next)    PT Home Exercise Plan continue   Consulted and Agree with Plan of Care Patient      Patient will benefit from skilled therapeutic intervention in order to improve the following deficits and impairments:  Abnormal gait, Decreased activity tolerance, Decreased balance, Decreased mobility, Decreased range of motion, Decreased scar mobility, Decreased strength, Difficulty walking, Hypermobility, Pain  Visit Diagnosis: Pain in left knee - Plan: PT plan of care cert/re-cert  Stiffness of left knee, not elsewhere classified - Plan: PT plan of care cert/re-cert  Muscle weakness (generalized) - Plan: PT plan of care cert/re-cert  Difficulty in walking, not elsewhere classified - Plan: PT plan of care cert/re-cert       G-Codes - 03/21/2016 1604    Functional Assessment Tool Used clinical judgement, objective measures    Functional Limitation Mobility: Walking and moving around   Mobility: Walking and Moving Around Current Status (V4259) At least 20 percent but less than 40 percent impaired,  limited or restricted   Mobility: Walking and Moving Around Goal Status (D6387) At least 1 percent but less than 20 percent impaired, limited  or restricted      Problem List Patient Active Problem List   Diagnosis Date Noted  . Osteoarthritis of left knee 12/14/2015    Class: End Stage  . Localized osteoarthritis of left knee 12/14/2015  . Small bowel obstruction, partial ([place]) 08/30/2013  . Acute renal failure ([place]) 08/30/2013  . 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 N&V (nausea and vomiting) 08/30/2013  . Abdominal pain, acute 08/30/2013  . SBO (small bowel obstruction) ([place]) 08/30/2013  . Hypokalemia 08/30/2013  . Partial small bowel obstruction ([place]) 08/30/2013  . Postmenopausal bleeding 03/18/2013  . OSTEOARTHRITIS, KNEE, LEFT 03/20/2010  . TRICUSPID REGURGITATION, MILD 03/03/2010  . RENAL INSUFFICIENCY 03/02/2010  . MORBID OBESITY 02/18/2010  . PREMATURE ATRIAL CONTRACTIONS 02/18/2010  . PREMATURE VENTRICULAR CONTRACTIONS 02/18/2010  . CARDIAC MURMUR 02/18/2010  . EDEMA 10/20/2009  . HYPOKALEMIA 07/30/2009  . RIB PAIN, RIGHT SIDED 05/26/2008  . ABDOMINAL PAIN, LEFT LOWER QUADRANT 12/31/2007  . ALLERGIC RHINITIS 06/25/2007  . ABNORMAL BLOOD CHEMISTRY , OTHER 06/25/2007  . UNSPECIFIED VITAMIN D DEFICIENCY 04/30/2007  . HYPOCALCEMIA 04/19/2007  . GLAUCOMA NEC 04/19/2007  . HYPERTENSION, ESSENTIAL NOS 04/19/2007  . ASTHMA 04/19/2007  . COLITIS, ULCERATIVE NOS 04/19/2007  . DISORDER, MENSTRUAL NEC 04/19/2007  . PSORIASIS 04/19/2007  . 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 GOUT NOS 02/21/2006    Dorene Ar, PTA 03/08/2016, 4:08 PM  [facility] [address] [place], [place], [address] Phone: [phone]   Fax:  [phone]  Name: Jackie Parker MRN: [id number] Date of Birth: 1941-08-12   Starr Lake PT, DPT, LAT, ATC  03/08/2016  4:08 PM

## 2016-03-10 ENCOUNTER — Ambulatory Visit: Payer: Medicare Other | Admitting: Physical Therapy

## 2016-03-10 DIAGNOSIS — R262 Difficulty in walking, not elsewhere classified: Secondary | ICD-10-CM

## 2016-03-10 DIAGNOSIS — M25562 Pain in left knee: Secondary | ICD-10-CM | POA: Diagnosis not present

## 2016-03-10 DIAGNOSIS — M25662 Stiffness of left knee, not elsewhere classified: Secondary | ICD-10-CM

## 2016-03-10 DIAGNOSIS — M6281 Muscle weakness (generalized): Secondary | ICD-10-CM

## 2016-03-10 NOTE — Therapy (Signed)
[place], [place], [address] Phone: [phone]   Fax:  [phone]  Physical Therapy Treatment  Patient Details  Name: Jackie Parker MRN: [id number] Date of Birth: 1940-10-12 Referring Provider: Dr. Louanne Skye   Encounter Date: 03/10/2016      PT End of Session - 03/10/16 1422    Visit Number 11   Number of Visits 16   Date for PT Re-Evaluation 04/12/16   Authorization Type medicare    PT Start Time 0215   PT Stop Time 0300   PT Time Calculation (min) 45 min      Past Medical History  Diagnosis Date  . Asthma   . Hypertension   . Bronchitis   . Colitis, ulcerative ([place])   . Cataracts, bilateral   . Pollen allergies   . DJD (degenerative joint disease)   . Psoriasis   . Hypokalemia   . AKI (acute kidney injury) ([place]) 12/2015    Past Surgical History  Procedure Laterality Date  . Colostomy    . Ileostomy    . Back surgery    . Tubal ligation    . 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 Knee arthroplasty Left 12/14/2015    Procedure: COMPUTER ASSISTED LEFT TOTAL KNEE ARTHROPLASTY;  Surgeon: Jessy Oto, MD;  Location: [place];  Service: Orthopedics;  Laterality: Left;    There were no vitals filed for this visit.      Subjective Assessment - 03/10/16 1421    Subjective Left knee was hurting last night, kept me awake   Currently in Pain? No/denies            [facility] PT Assessment - 03/10/16 0001    AROM   Left Knee Flexion 108                     [facility] Adult PT Treatment/Exercise - 03/10/16 0001    Ambulation/Gait   Ambulation/Gait Yes   Ambulation/Gait Assistance 7: Independent   Ambulation Distance (Feet) 200 Feet   Assistive device None   Gait Pattern Decreased step length - right;Decreased step length - left;Decreased dorsiflexion - right;Decreased dorsiflexion - left;Decreased weight shift to right;Poor foot clearance - left;Poor foot clearance - right   Gait Comments Pt ambulates with short steps and decreased stance on rigt, pt reports minimal increased pain during gait and it is only on the right knee, no increased pain on left   High Level Balance   High Level Balance Activities Backward walking;Braiding;Side stepping;Tandem walking;Other (comment)  high knee walking   High Level Balance Comments tandem stance 20 sec best   Knee/Hip Exercises: Stretches   Knee: Self-Stretch to increase Flexion 30 seconds;3 reps   Knee: Self-Stretch Limitations 3 reps heel slide with strap   Knee/Hip Exercises: Aerobic   Nustep L5 x 7 minutes   Knee/Hip Exercises: Standing   Lateral Step Up 15 reps;Hand Hold: 1;Step Height: 4"   Forward Step Up 20 reps;Hand Hold: 1;Step Height: 6"   SLS 5 second best   Knee/Hip Exercises: Seated   Long Arc Quad 15 reps  2 sets   Long Arc Quad Weight 5 lbs.   Hamstring Curl 2 sets;15 reps   Hamstring Limitations green band   Knee/Hip Exercises: Supine   Heel Slides 10 reps   Straight Leg Raises 2 sets;10 reps                  PT Short Term Goals -  03/10/16 1520    PT SHORT TERM GOAL #1   Title pt will be I with inital HEP    Status Achieved   PT SHORT TERM GOAL #2   Title -Pt will increase L knee extension by 5 degrees   Status Achieved   PT SHORT TERM GOAL #3   Title Patient will ambulate 200' w/ no device with proper gait technique and > 2/10 pain    Status On-going   PT SHORT TERM GOAL #4   Title Pt will increase Lleftsingle leg stance time to 5 seconds   PT SHORT TERM GOAL #5   Title Pt will increase gross left lower extremity stregth to 4+/5    Status Achieved           PT Long Term Goals - 03/08/16 1447    PT LONG TERM GOAL #1   Title Pt will go up/down 8 steps with reciprocol gait pattern    Time 8   Period Weeks   Status Achieved   PT LONG TERM GOAL #2   Title Pt will bend to pick object off the ground without incresased pain    Time 8   Period Weeks   Status On-going   PT  LONG TERM GOAL #3   Title Pt will demonstrate 115 degrees of left knee flexion in order to get up and down out of a low chair without difficulty   Time 8   Period Weeks   Status Achieved   PT LONG TERM GOAL #4   Title pt will increase her FOTO score to >/= 50 to demonstrate improvement in function at discharge (09/23/2015)   Baseline no initial FOTO score   Status Deferred               Plan - 03/10/16 1519    Clinical Impression Statement Continued closed chain and balance exercises with pt achieving STG#4. She is ambulating without SPC and good safety. She reports minimal left knee pain with gait however low back and right knee become painful with ambulation.    PT Next Visit Plan gait training without SPC, high level balance, dynamic balance, closed chain as tolerated (right knee to be replaced next)       Patient will benefit from skilled therapeutic intervention in order to improve the following deficits and impairments:  Abnormal gait, Decreased activity tolerance, Decreased balance, Decreased mobility, Decreased range of motion, Decreased scar mobility, Decreased strength, Difficulty walking, Hypermobility, Pain  Visit Diagnosis: Pain in left knee  Stiffness of left knee, not elsewhere classified  Muscle weakness (generalized)  Difficulty in walking, not elsewhere classified     Problem List Patient Active Problem List   Diagnosis Date Noted  . Osteoarthritis of left knee 12/14/2015    Class: End Stage  . Localized osteoarthritis of left knee 12/14/2015  . Small bowel obstruction, partial ([place]) 08/30/2013  . Acute renal failure ([place]) 08/30/2013  . 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 N&V (nausea and vomiting) 08/30/2013  . Abdominal pain, acute 08/30/2013  . SBO (small bowel obstruction) ([place]) 08/30/2013  . Hypokalemia 08/30/2013  . Partial small bowel obstruction ([place]) 08/30/2013  . Postmenopausal bleeding 03/18/2013  . OSTEOARTHRITIS, KNEE, LEFT  03/20/2010  . TRICUSPID REGURGITATION, MILD 03/03/2010  . RENAL INSUFFICIENCY 03/02/2010  . MORBID OBESITY 02/18/2010  . PREMATURE ATRIAL CONTRACTIONS 02/18/2010  . PREMATURE VENTRICULAR CONTRACTIONS 02/18/2010  . CARDIAC MURMUR 02/18/2010  . EDEMA 10/20/2009  . HYPOKALEMIA 07/30/2009  . RIB PAIN, RIGHT  SIDED 05/26/2008  . ABDOMINAL PAIN, LEFT LOWER QUADRANT 12/31/2007  . ALLERGIC RHINITIS 06/25/2007  . ABNORMAL BLOOD CHEMISTRY , OTHER 06/25/2007  . UNSPECIFIED VITAMIN D DEFICIENCY 04/30/2007  . HYPOCALCEMIA 04/19/2007  . GLAUCOMA NEC 04/19/2007  . HYPERTENSION, ESSENTIAL NOS 04/19/2007  . ASTHMA 04/19/2007  . COLITIS, ULCERATIVE NOS 04/19/2007  . DISORDER, MENSTRUAL NEC 04/19/2007  . PSORIASIS 04/19/2007  . 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 GOUT NOS 02/21/2006    Dorene Ar, PTA 03/10/2016, 3:54 PM  [facility] [address] [place], [place], [address] Phone: [phone]   Fax:  [phone]  Name: Mykira Hofmeister MRN: [id number] Date of Birth: 03/02/1941

## 2016-03-16 ENCOUNTER — Ambulatory Visit: Payer: Medicare Other | Attending: Specialist | Admitting: Physical Therapy

## 2016-03-16 DIAGNOSIS — M25662 Stiffness of left knee, not elsewhere classified: Secondary | ICD-10-CM | POA: Diagnosis present

## 2016-03-16 DIAGNOSIS — M6281 Muscle weakness (generalized): Secondary | ICD-10-CM | POA: Diagnosis present

## 2016-03-16 DIAGNOSIS — R262 Difficulty in walking, not elsewhere classified: Secondary | ICD-10-CM | POA: Insufficient documentation

## 2016-03-16 DIAGNOSIS — M25562 Pain in left knee: Secondary | ICD-10-CM | POA: Diagnosis present

## 2016-03-16 NOTE — Therapy (Signed)
[place], [place], [address] Phone: [phone]   Fax:  [phone]  Physical Therapy Treatment  Patient Details  Name: Jackie Parker MRN: [id number] Date of Birth: 04/23/1941 Referring Provider: Dr. Louanne Skye   Encounter Date: 03/16/2016      PT End of Session - 03/16/16 1604    Visit Number 12   Number of Visits 16   Date for PT Re-Evaluation 04/12/16   Authorization Type medicare    PT Start Time 1500   PT Stop Time 1856   PT Time Calculation (min) 48 min   Activity Tolerance Patient tolerated treatment well   Behavior During Therapy [facility] for tasks assessed/performed      Past Medical History  Diagnosis Date  . Asthma   . Hypertension   . Bronchitis   . Colitis, ulcerative ([place])   . Cataracts, bilateral   . Pollen allergies   . DJD (degenerative joint disease)   . Psoriasis   . Hypokalemia   . AKI (acute kidney injury) ([place]) 12/2015    Past Surgical History  Procedure Laterality Date  . Colostomy    . Ileostomy    . Back surgery    . Tubal ligation    . 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 Knee arthroplasty Left 12/14/2015    Procedure: COMPUTER ASSISTED LEFT TOTAL KNEE ARTHROPLASTY;  Surgeon: Jessy Oto, MD;  Location: [place];  Service: Orthopedics;  Laterality: Left;    There were no vitals filed for this visit.      Subjective Assessment - 03/16/16 1513    Subjective "left knee is doing well, my R knee is the one giving me issues"    Currently in Pain? Yes   Pain Score 0-No pain   Pain Orientation Left   Pain Type Chronic pain   Aggravating Factors  tired with prolong walking/ standing   Pain Score 5   Pain Location Knee   Pain Orientation Left;Right   Pain Descriptors / Indicators Aching;Sore   Pain Type Chronic pain   Pain Onset More than a month ago   Pain Frequency Intermittent   Aggravating Factors  walking/ standing                          [facility] Adult PT Treatment/Exercise - 03/16/16 0001    Knee/Hip Exercises: Stretches   Active Hamstring Stretch 2 reps;30 seconds  with strap   Quad Stretch 3 reps;30 seconds  contract/relax with 10 sec contraction   Knee/Hip Exercises: Aerobic   Nustep L5 x 10 min  using LE only   Knee/Hip Exercises: Standing   Gait Training 150 ft working on increased step length and heel strike bilateral withou AD and trunk rotation with contralateral arm swing.              Balance Exercises - 03/16/16 1611    Balance Exercises: Standing   Tandem Stance Eyes open;Eyes closed;Intermittent upper extremity support;5 reps;30 secs  EO 3, EC best was 8 sec with mod postural sway   SLS Eyes open;Eyes closed;3 reps;Other (comment);10 secs  best time was 8 sec: modifited to tandem stance           PT Education - 03/16/16 1548    Education provided Yes   Education Details updated HEP for balance while maintaining safety with SLS    Person(s) Educated Patient   Methods Explanation;Handout   Comprehension Verbalized understanding  PT Short Term Goals - 03/10/16 1520    PT SHORT TERM GOAL #1   Title pt will be I with inital HEP    Status Achieved   PT SHORT TERM GOAL #2   Title -Pt will increase L knee extension by 5 degrees   Status Achieved   PT SHORT TERM GOAL #3   Title Patient will ambulate 200' w/ no device with proper gait technique and > 2/10 pain    Status On-going   PT SHORT TERM GOAL #4   Title Pt will increase Lleftsingle leg stance time to 5 seconds   PT SHORT TERM GOAL #5   Title Pt will increase gross left lower extremity stregth to 4+/5    Status Achieved           PT Long Term Goals - 03/08/16 1447    PT LONG TERM GOAL #1   Title Pt will go up/down 8 steps with reciprocol gait pattern    Time 8   Period Weeks   Status Achieved   PT LONG TERM GOAL #2   Title Pt will bend to pick object off the ground without incresased pain    Time 8   Period Weeks    Status On-going   PT LONG TERM GOAL #3   Title Pt will demonstrate 115 degrees of left knee flexion in order to get up and down out of a low chair without difficulty   Time 8   Period Weeks   Status Achieved   PT LONG TERM GOAL #4   Title pt will increase her FOTO score to >/= 50 to demonstrate improvement in function at discharge (09/23/2015)   Baseline no initial FOTO score   Status Deferred               Plan - 03/16/16 1605    Clinical Impression Statement Mrs. popson reports her Left knee is doing well with some soreness intermittently. pt's biggest complaint is pain in the R knee. she arrived without an AD but states she had to use it alot of the weekend. practiced balance with SLS and tandem with EO/EC, updated HEP to include corner balance for safety.    PT Next Visit Plan gait training without SPC, high level balance, dynamic balance, closed chain as tolerated (right knee to be replaced next)    Consulted and Agree with Plan of Care Patient      Patient will benefit from skilled therapeutic intervention in order to improve the following deficits and impairments:  Abnormal gait, Decreased activity tolerance, Decreased balance, Decreased mobility, Decreased range of motion, Decreased scar mobility, Decreased strength, Difficulty walking, Hypermobility, Pain  Visit Diagnosis: Pain in left knee  Stiffness of left knee, not elsewhere classified  Muscle weakness (generalized)  Difficulty in walking, not elsewhere classified     Problem List Patient Active Problem List   Diagnosis Date Noted  . Osteoarthritis of left knee 12/14/2015    Class: End Stage  . Localized osteoarthritis of left knee 12/14/2015  . Small bowel obstruction, partial ([place]) 08/30/2013  . Acute renal failure ([place]) 08/30/2013  . 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 N&V (nausea and vomiting) 08/30/2013  . Abdominal pain, acute 08/30/2013  . SBO (small bowel obstruction) ([place]) 08/30/2013   . Hypokalemia 08/30/2013  . Partial small bowel obstruction ([place]) 08/30/2013  . Postmenopausal bleeding 03/18/2013  . OSTEOARTHRITIS, KNEE, LEFT 03/20/2010  . TRICUSPID REGURGITATION, MILD 03/03/2010  . RENAL INSUFFICIENCY 03/02/2010  .  MORBID OBESITY 02/18/2010  . PREMATURE ATRIAL CONTRACTIONS 02/18/2010  . PREMATURE VENTRICULAR CONTRACTIONS 02/18/2010  . CARDIAC MURMUR 02/18/2010  . EDEMA 10/20/2009  . HYPOKALEMIA 07/30/2009  . RIB PAIN, RIGHT SIDED 05/26/2008  . ABDOMINAL PAIN, LEFT LOWER QUADRANT 12/31/2007  . ALLERGIC RHINITIS 06/25/2007  . ABNORMAL BLOOD CHEMISTRY , OTHER 06/25/2007  . UNSPECIFIED VITAMIN D DEFICIENCY 04/30/2007  . HYPOCALCEMIA 04/19/2007  . GLAUCOMA NEC 04/19/2007  . HYPERTENSION, ESSENTIAL NOS 04/19/2007  . ASTHMA 04/19/2007  . COLITIS, ULCERATIVE NOS 04/19/2007  . DISORDER, MENSTRUAL NEC 04/19/2007  . PSORIASIS 04/19/2007  . 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 GOUT NOS 02/21/2006   Starr Lake PT, DPT, LAT, ATC  03/16/2016  4:14 PM    [facility] [address] [place], [place], [address] Phone: [phone]   Fax:  [phone]  Name: Jackie Parker MRN: [id number] Date of Birth: 08-01-1941

## 2016-03-18 ENCOUNTER — Ambulatory Visit: Payer: Medicare Other | Admitting: Physical Therapy

## 2016-03-21 ENCOUNTER — Ambulatory Visit: Payer: Medicare Other | Admitting: Physical Therapy

## 2016-03-21 DIAGNOSIS — M6281 Muscle weakness (generalized): Secondary | ICD-10-CM

## 2016-03-21 DIAGNOSIS — M25662 Stiffness of left knee, not elsewhere classified: Secondary | ICD-10-CM

## 2016-03-21 DIAGNOSIS — M25562 Pain in left knee: Secondary | ICD-10-CM

## 2016-03-21 DIAGNOSIS — R262 Difficulty in walking, not elsewhere classified: Secondary | ICD-10-CM

## 2016-03-21 NOTE — Therapy (Signed)
Jackie Parker, [place], [address] Phone: [phone]   Fax:  [phone]  Physical Therapy Treatment / Discharge Note  Patient Details  Name: Jackie Parker MRN: [id number] Date of Birth: [birth year] Referring Provider: Dr. Louanne Parker   Encounter Date: 03/21/2016      PT End of Session - 03/21/16 1534    Visit Number 13   Number of Visits 16   Date for PT Re-Evaluation 04/12/16   Authorization Type medicare    PT Start Time 1508  shortened visit due to discharge   PT Stop Time 1539   PT Time Calculation (min) 31 min   Activity Tolerance Patient tolerated treatment well   Behavior During Therapy [facility] for tasks assessed/performed      Past Medical History  Diagnosis Date  . Asthma   . Hypertension   . Bronchitis   . Colitis, ulcerative ([place])   . Cataracts, bilateral   . Pollen allergies   . DJD (degenerative joint disease)   . Psoriasis   . Hypokalemia   . AKI (acute kidney injury) ([place]) 12/2015    Past Surgical History  Procedure Laterality Date  . Colostomy    . Ileostomy    . Back surgery    . Tubal ligation    . 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 Knee arthroplasty Left 12/14/2015    Procedure: COMPUTER ASSISTED LEFT TOTAL KNEE ARTHROPLASTY;  Surgeon: Jackie Oto, MD;  Location: [facility];  Service: Orthopedics;  Laterality: Left;    There were no vitals filed for this visit.      Subjective Assessment - 03/21/16 1509    Subjective "The left knee is doing good, My R knee is giving the biggest issues"    Currently in Pain? Yes   Pain Score 0-No pain   Pain Location Knee   Pain Orientation Left   Pain Score 6   Pain Location Knee   Pain Orientation Right   Pain Descriptors / Indicators Aching;Sore   Pain Type Chronic pain   Pain Onset More than a month ago   Pain Frequency Intermittent   Aggravating Factors  walking/ standing            [facility] PT Assessment - 03/21/16 1514     AROM   Left Knee Extension -3   Left Knee Flexion 116   Strength   Left Knee Flexion 5/5   Left Knee Extension 5/5                             PT Education - 03/21/16 1533    Education provided Yes   Education Details reviewed HEP and discussed if her R knee needs to be replaced that she can begin using her inital HEP to strengthen her R knee prior to the surgery; the stronger she goes in surgery the stronger she will come out of the surgery.    Person(s) Educated Patient   Methods Explanation;Verbal cues   Comprehension Verbalized understanding;Verbal cues required          PT Short Term Goals - 03/21/16 1519    PT SHORT TERM GOAL #1   Title pt will be I with inital HEP    Time 4   Period Weeks   Status Achieved   PT SHORT TERM GOAL #2   Title -Pt will increase L knee extension by 5 degrees   Time 4   Period  Weeks   Status Achieved   PT SHORT TERM GOAL #3   Title Patient will ambulate 200' w/ no device with proper gait technique and > 2/10 pain    Baseline but painful due to R knee pain   Time 6   Period Weeks   Status Achieved   PT SHORT TERM GOAL #4   Title Pt will increase Lleftsingle leg stance time to 5 seconds   Time 4   Period Weeks   Status Achieved   PT SHORT TERM GOAL #5   Title Pt will increase gross left lower extremity stregth to 4+/5    Period Weeks   Status Achieved           PT Long Term Goals - 2016-04-11 1520    PT LONG TERM GOAL #1   Title Pt will go up/down 8 steps with reciprocol gait pattern    Time 8   Period Weeks   Status Achieved   PT LONG TERM GOAL #2   Title Pt will bend to pick object off the ground without incresased pain    Time 8   Period Weeks   Status Achieved   PT LONG TERM GOAL #3   Title Pt will demonstrate 115 degrees of left knee flexion in order to get up and down out of a low chair without difficulty   Time 8   Period Weeks   Status Achieved   PT LONG TERM GOAL #4   Title pt will  increase her FOTO score to >/= 50 to demonstrate improvement in function at discharge (09/23/2015)   Time 6   Period Weeks   Status Deferred               Plan - 2016/04/11 1535    Clinical Impression Statement Mrs. Jackie Parker has made great progress with the L knee improving her knee mobility and strength. she met all goals. currenlty pt's biggest limiting factor is her R knee which she reports she may need to get a replacement and plans to talk to her physician. pt reports that she is able to maintain and progress her current level of function independently for her L knee and will be discharged today.    PT Next Visit Plan discharged from [facility] reviewed HEP   Consulted and Agree with Plan of Care Patient      Patient will benefit from skilled therapeutic intervention in order to improve the following deficits and impairments:  Abnormal gait, Decreased activity tolerance, Decreased balance, Decreased mobility, Decreased range of motion, Decreased scar mobility, Decreased strength, Difficulty walking, Hypermobility, Pain  Visit Diagnosis: Pain in left knee  Stiffness of left knee, not elsewhere classified  Muscle weakness (generalized)  Difficulty in walking, not elsewhere classified       G-Codes - 04-11-2016 1538    Functional Assessment Tool Used clinical judgement, objective measures    Functional Limitation Mobility: Walking and moving around   Mobility: Walking and Moving Around Goal Status [phone]) At least 1 percent but less than 20 percent impaired, limited or restricted   Mobility: Walking and Moving Around Discharge Status [phone]) At least 1 percent but less than 20 percent impaired, limited or restricted      Problem List Patient Active Problem List   Diagnosis Date Noted  . Osteoarthritis of left knee 12/14/2015    Class: End Stage  . Localized osteoarthritis of left knee 12/14/2015  . Small bowel obstruction, partial ([place]) 08/30/2013  .  Acute renal failure ([place]) 08/30/2013  . 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 N&V (nausea and vomiting) 08/30/2013  . Abdominal pain, acute 08/30/2013  . SBO (small bowel obstruction) ([place]) 08/30/2013  . Hypokalemia 08/30/2013  . Partial small bowel obstruction ([place]) 08/30/2013  . Postmenopausal bleeding 03/18/2013  . OSTEOARTHRITIS, KNEE, LEFT 03/20/2010  . TRICUSPID REGURGITATION, MILD 03/03/2010  . RENAL INSUFFICIENCY 03/02/2010  . MORBID OBESITY 02/18/2010  . PREMATURE ATRIAL CONTRACTIONS 02/18/2010  . PREMATURE VENTRICULAR CONTRACTIONS 02/18/2010  . CARDIAC MURMUR 02/18/2010  . EDEMA 10/20/2009  . HYPOKALEMIA 07/30/2009  . RIB PAIN, RIGHT SIDED 05/26/2008  . ABDOMINAL PAIN, LEFT LOWER QUADRANT 12/31/2007  . ALLERGIC RHINITIS 06/25/2007  . ABNORMAL BLOOD CHEMISTRY , OTHER 06/25/2007  . UNSPECIFIED VITAMIN D DEFICIENCY 04/30/2007  . HYPOCALCEMIA 04/19/2007  . GLAUCOMA NEC 04/19/2007  . HYPERTENSION, ESSENTIAL NOS 04/19/2007  . ASTHMA 04/19/2007  . COLITIS, ULCERATIVE NOS 04/19/2007  . DISORDER, MENSTRUAL NEC 04/19/2007  . PSORIASIS 04/19/2007  . 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 GOUT NOS 02/21/2006   Starr Lake PT, DPT, LAT, ATC  03/21/2016  3:41 PM      [facility] [address] [place], [place], [address] Phone: [phone]   Fax:  [phone]  Name: Jackie Parker MRN: [id number] Date of Birth: 09/05/1941    PHYSICAL THERAPY DISCHARGE SUMMARY  Visits from Start of Care: 13  Current functional level related to goals / functional outcomes: See goals   Remaining deficits: Hypersensitivity around the incision, intermittent soreness around the lateral aspect of the knee. Biggest limiting factor is R knee due with pain and limited weight bearing status with intermittent buckling/ giving away of the R knee.    Education / Equipment: HEP, theraband,   Plan: Patient agrees to discharge.   Patient goals were met. Patient is being discharged due to meeting the stated rehab goals.  ?????

## 2016-03-23 ENCOUNTER — Encounter: Payer: Medicare Other | Admitting: Physical Therapy

## 2016-03-29 ENCOUNTER — Encounter: Payer: Medicare Other | Admitting: Physical Therapy

## 2016-03-31 ENCOUNTER — Encounter: Payer: Medicare Other | Admitting: Physical Therapy

## 2016-04-05 ENCOUNTER — Encounter: Payer: Medicare Other | Admitting: Physical Therapy

## 2016-04-07 ENCOUNTER — Encounter: Payer: Medicare Other | Admitting: Physical Therapy

## 2016-05-17 ENCOUNTER — Other Ambulatory Visit: Payer: Self-pay | Admitting: Internal Medicine

## 2016-05-17 DIAGNOSIS — E2839 Other primary ovarian failure: Secondary | ICD-10-CM

## 2016-06-22 ENCOUNTER — Inpatient Hospital Stay (HOSPITAL_COMMUNITY): Admission: RE | Admit: 2016-06-22 | Payer: Medicare Other | Source: Ambulatory Visit

## 2016-06-23 ENCOUNTER — Encounter (HOSPITAL_COMMUNITY): Payer: Self-pay

## 2016-06-23 ENCOUNTER — Encounter (HOSPITAL_COMMUNITY)
Admission: RE | Admit: 2016-06-23 | Discharge: 2016-06-23 | Disposition: A | Discharge status: Home / Self Care | Payer: Medicare Other | Source: Ambulatory Visit | Attending: Specialist | Admitting: Specialist

## 2016-06-23 DIAGNOSIS — Z01818 Encounter for other preprocedural examination: Secondary | ICD-10-CM | POA: Diagnosis present

## 2016-06-23 HISTORY — DX: Unspecified glaucoma: H40.9

## 2016-06-23 HISTORY — DX: Effusion, unspecified joint: M25.40

## 2016-06-23 HISTORY — DX: Personal history of other diseases of the musculoskeletal system and connective tissue: Z87.39

## 2016-06-23 HISTORY — DX: Personal history of other medical treatment: Z92.89

## 2016-06-23 HISTORY — DX: Vitamin D deficiency, unspecified: E55.9

## 2016-06-23 HISTORY — DX: Pain in unspecified joint: M25.50

## 2016-06-23 HISTORY — DX: Personal history of other diseases of the respiratory system: Z87.09

## 2016-06-23 HISTORY — DX: Allergy, unspecified, initial encounter: T78.40XA

## 2016-06-23 HISTORY — DX: Frequency of micturition: R35.0

## 2016-06-23 HISTORY — DX: Dizziness and giddiness: R42

## 2016-06-23 LAB — URINALYSIS, ROUTINE W REFLEX MICROSCOPIC
Bilirubin Urine: NEGATIVE
Glucose, UA: NEGATIVE mg/dL
Hgb urine dipstick: NEGATIVE
Ketones, ur: NEGATIVE mg/dL
Nitrite: NEGATIVE
Protein, ur: NEGATIVE mg/dL
Specific Gravity, Urine: 1.014 (ref 1.005–1.030)
pH: 6.5 (ref 5.0–8.0)

## 2016-06-23 LAB — CBC
HCT: 43.3 % (ref 36.0–46.0)
Hemoglobin: 14.8 g/dL (ref 12.0–15.0)
MCH: 31.6 pg (ref 26.0–34.0)
MCHC: 34.2 g/dL (ref 30.0–36.0)
MCV: 92.3 fL (ref 78.0–100.0)
Platelets: 221 10*3/uL (ref 150–400)
RBC: 4.69 MIL/uL (ref 3.87–5.11)
RDW: 14.1 % (ref 11.5–15.5)
WBC: 8.7 10*3/uL (ref 4.0–10.5)

## 2016-06-23 LAB — URINE MICROSCOPIC-ADD ON

## 2016-06-23 LAB — COMPREHENSIVE METABOLIC PANEL
ALT: 19 U/L (ref 14–54)
AST: 37 U/L (ref 15–41)
Albumin: 3.2 g/dL — ABNORMAL LOW (ref 3.5–5.0)
Alkaline Phosphatase: 118 U/L (ref 38–126)
Anion gap: 8 (ref 5–15)
BUN: <5 mg/dL — ABNORMAL LOW (ref 6–20)
CO2: 23 mmol/L (ref 22–32)
Calcium: 8.9 mg/dL (ref 8.9–10.3)
Chloride: 108 mmol/L (ref 101–111)
Creatinine, Ser: 1.14 mg/dL — ABNORMAL HIGH (ref 0.44–1.00)
GFR calc Af Amer: 53 mL/min — ABNORMAL LOW (ref >60)
GFR calc non Af Amer: 46 mL/min — ABNORMAL LOW (ref >60)
Glucose, Bld: 85 mg/dL (ref 65–99)
Potassium: 3.1 mmol/L — ABNORMAL LOW (ref 3.5–5.1)
Sodium: 139 mmol/L (ref 135–145)
Total Bilirubin: 0.9 mg/dL (ref 0.3–1.2)
Total Protein: 8 g/dL (ref 6.5–8.1)

## 2016-06-23 MED ORDER — CHLORHEXIDINE GLUCONATE 4 % EX LIQD: 60.0000 mL | Freq: Once | CUTANEOUS | Status: DC

## 2016-06-23 NOTE — Pre-Procedure Instructions (Signed)
Jackie Parker  06/23/2016      [id number] - [place], [place] - [place] [address] Phone: [phone] Fax: [phone]  [facility] [place], [place] [address] [place] [address] Phone: [phone] Fax: [phone]    Your procedure is scheduled on Mon, Oct 16 @ 7:30 AM  Report to [facility] at 5:30 AM  Call this number if you have problems the morning of surgery:  [phone]   Remember:  Do not eat food or drink liquids after midnight.  Take these medicines the morning of surgery with A SIP OF WATER Amlodipine(Norvasc),Eye Drops,Zyrtec(Cetirizine),Advair<Bring Your Inhaler With You>,Gabapentin(Neurontin-if needed),Singulair(Montelukast),Ditropan(Oxybutynin),and Compazine(Prochlorperazine-if needed)              No Goody's,BC's,Aleve,Advil,Motrin,Ibuprofen,Fish Oil,or any Herbal Medications.    Do not wear jewelry, make-up or nail polish.  Do not wear lotions, powders, or perfumes, or deoderant.  Do not shave 48 hours prior to surgery.    Do not bring valuables to the hospital.  [facility] is not responsible for any belongings or valuables.  Contacts, dentures or bridgework may not be worn into surgery.  Leave your suitcase in the car.  After surgery it may be brought to your room.  For patients admitted to the hospital, discharge time will be determined by your treatment team.  Patients discharged the day of surgery will not be allowed to drive home.    [facility]  Before surgery, you can play an important role.  Because skin is not sterile, your skin needs to be as free of germs as possible.  You can reduce the number of germs on you skin by washing with CHG (chlorahexidine gluconate) soap before surgery.  CHG is an antiseptic cleaner which kills  germs and bonds with the skin to continue killing germs even after washing.  Please DO NOT use if you have an allergy to CHG or antibacterial soaps.  If your skin becomes reddened/irritated stop using the CHG and inform your nurse when you arrive at Short Stay.  Do not shave (including legs and underarms) for at least 48 hours prior to the first CHG shower.  You may shave your face.  Please follow these instructions carefully:   1.  Shower with CHG Soap the night before surgery and the                                morning of Surgery.  2.  If you choose to wash your hair, wash your hair first as usual with your       normal shampoo.  3.  After you shampoo, rinse your hair and body thoroughly to remove the                      Shampoo.  4.  Use CHG as you would any other liquid soap.  You can apply chg directly       to the skin and wash gently with scrungie or a clean washcloth.  5.  Apply the CHG Soap to your body ONLY FROM THE NECK DOWN.        Do not use on open wounds or open sores.  Avoid contact with your eyes,  ears, mouth and genitals (private parts).  Wash genitals (private parts)       with your normal soap.  6.  Wash thoroughly, paying special attention to the area where your surgery        will be performed.  7.  Thoroughly rinse your body with warm water from the neck down.  8.  DO NOT shower/wash with your normal soap after using and rinsing off       the CHG Soap.  9.  Pat yourself dry with a clean towel.            10.  Wear clean pajamas.            11.  Place clean sheets on your bed the night of your first shower and do not        sleep with pets.  Day of Surgery  Do not apply any lotions/deoderants the morning of surgery.  Please wear clean clothes to the hospital/surgery center.    Please read over the following fact sheets that you were given. Pain Booklet, Coughing and Deep Breathing, MRSA Information and Surgical Site Infection Prevention

## 2016-06-23 NOTE — Progress Notes (Signed)
Cardiologist denies  Medical Md is Dr.Edwin Avbuere  Echo report in epic from 2011  Stress test denies  Heart cath denies  CXR report in epic from 12-04-15  EKG denies

## 2016-06-26 NOTE — Anesthesia Preprocedure Evaluation (Addendum)
Anesthesia Evaluation  Patient identified by MRN, date of birth, ID band Patient awake    Reviewed: Allergy & Precautions, NPO status , Patient's Chart, lab work & pertinent test results  Airway Mallampati: I  TM Distance: >3 FB Neck ROM: Full    Dental  (+) Dental Advisory Given, Edentulous Upper, Edentulous Lower   Pulmonary asthma (daily Advair use) ,    Pulmonary exam normal breath sounds clear to auscultation       Cardiovascular hypertension, Pt. on medications Normal cardiovascular exam Rhythm:Regular Rate:Normal     Neuro/Psych negative neurological ROS  negative psych ROS   GI/Hepatic Neg liver ROS, UC s/p ileostomy   Endo/Other  Obesity   Renal/GU Renal InsufficiencyRenal disease     Musculoskeletal  (+) Arthritis , Osteoarthritis,    Abdominal   Peds  Hematology negative hematology ROS (+) Plt 221k   Anesthesia Other Findings Day of surgery medications reviewed with the patient.  Reproductive/Obstetrics                            Anesthesia Physical Anesthesia Plan  ASA: II  Anesthesia Plan: Spinal and General   Post-op Pain Management:  Regional for Post-op pain   Induction: Intravenous  Airway Management Planned: Oral ETT  Additional Equipment:   Intra-op Plan:   Post-operative Plan: Extubation in OR  Informed Consent: I have reviewed the patients History and Physical, chart, labs and discussed the procedure including the risks, benefits and alternatives for the proposed anesthesia with the patient or authorized representative who has indicated his/her understanding and acceptance.   Dental advisory given  Plan Discussed with: CRNA, Anesthesiologist and Surgeon  Anesthesia Plan Comments: (Risks/benefits of general anesthesia discussed with patient including risk of damage to teeth, lips, gum, and tongue, nausea/vomiting, allergic reactions to medications, and  the possibility of heart attack, stroke and death.  All patient questions answered.  Patient wishes to proceed.  Patient prefers GA)       Anesthesia Quick Evaluation

## 2016-06-27 ENCOUNTER — Encounter (HOSPITAL_COMMUNITY)
Admission: RE | Disposition: A | Discharge status: Home Health | Payer: Self-pay | Source: Ambulatory Visit | Attending: Specialist

## 2016-06-27 ENCOUNTER — Inpatient Hospital Stay (HOSPITAL_COMMUNITY): Payer: Medicare Other | Admitting: Anesthesiology

## 2016-06-27 ENCOUNTER — Encounter (HOSPITAL_COMMUNITY): Payer: Self-pay | Admitting: Urology

## 2016-06-27 ENCOUNTER — Inpatient Hospital Stay (HOSPITAL_COMMUNITY)
Admission: RE | Admit: 2016-06-27 | Discharge: 2016-07-01 | DRG: 470 | Disposition: A | Discharge status: Home Health | Payer: Medicare Other | Source: Ambulatory Visit | Attending: Specialist | Admitting: Specialist

## 2016-06-27 DIAGNOSIS — N179 Acute kidney failure, unspecified: Secondary | ICD-10-CM | POA: Diagnosis present

## 2016-06-27 DIAGNOSIS — M1711 Unilateral primary osteoarthritis, right knee: Secondary | ICD-10-CM | POA: Diagnosis not present

## 2016-06-27 DIAGNOSIS — N183 Chronic kidney disease, stage 3 (moderate): Secondary | ICD-10-CM | POA: Diagnosis present

## 2016-06-27 DIAGNOSIS — Z6835 Body mass index (BMI) 35.0-35.9, adult: Secondary | ICD-10-CM

## 2016-06-27 DIAGNOSIS — D62 Acute posthemorrhagic anemia: Secondary | ICD-10-CM | POA: Diagnosis not present

## 2016-06-27 DIAGNOSIS — R5082 Postprocedural fever: Secondary | ICD-10-CM | POA: Diagnosis not present

## 2016-06-27 DIAGNOSIS — Z7982 Long term (current) use of aspirin: Secondary | ICD-10-CM | POA: Diagnosis not present

## 2016-06-27 DIAGNOSIS — Z96652 Presence of left artificial knee joint: Secondary | ICD-10-CM | POA: Diagnosis present

## 2016-06-27 DIAGNOSIS — I129 Hypertensive chronic kidney disease with stage 1 through stage 4 chronic kidney disease, or unspecified chronic kidney disease: Secondary | ICD-10-CM | POA: Diagnosis present

## 2016-06-27 DIAGNOSIS — H269 Unspecified cataract: Secondary | ICD-10-CM | POA: Diagnosis present

## 2016-06-27 DIAGNOSIS — D649 Anemia, unspecified: Secondary | ICD-10-CM

## 2016-06-27 DIAGNOSIS — Z79899 Other long term (current) drug therapy: Secondary | ICD-10-CM

## 2016-06-27 DIAGNOSIS — H409 Unspecified glaucoma: Secondary | ICD-10-CM | POA: Diagnosis present

## 2016-06-27 DIAGNOSIS — N390 Urinary tract infection, site not specified: Secondary | ICD-10-CM | POA: Diagnosis present

## 2016-06-27 DIAGNOSIS — E669 Obesity, unspecified: Secondary | ICD-10-CM | POA: Diagnosis present

## 2016-06-27 DIAGNOSIS — M1712 Unilateral primary osteoarthritis, left knee: Secondary | ICD-10-CM | POA: Diagnosis present

## 2016-06-27 DIAGNOSIS — M109 Gout, unspecified: Secondary | ICD-10-CM | POA: Diagnosis present

## 2016-06-27 DIAGNOSIS — J45909 Unspecified asthma, uncomplicated: Secondary | ICD-10-CM | POA: Diagnosis present

## 2016-06-27 DIAGNOSIS — D72829 Elevated white blood cell count, unspecified: Secondary | ICD-10-CM

## 2016-06-27 DIAGNOSIS — M25561 Pain in right knee: Secondary | ICD-10-CM | POA: Diagnosis present

## 2016-06-27 DIAGNOSIS — R509 Fever, unspecified: Secondary | ICD-10-CM

## 2016-06-27 HISTORY — PX: TOTAL KNEE ARTHROPLASTY: SHX125

## 2016-06-27 LAB — PROTIME-INR
INR: 1.17
Prothrombin Time: 15 seconds (ref 11.4–15.2)

## 2016-06-27 LAB — APTT: aPTT: 31 seconds (ref 24–36)

## 2016-06-27 SURGERY — ARTHROPLASTY, KNEE, TOTAL
Anesthesia: Regional | Site: Knee | Laterality: Right

## 2016-06-27 MED ORDER — KETOROLAC TROMETHAMINE 15 MG/ML IJ SOLN
7.5000 mg | Freq: Four times a day (QID) | INTRAMUSCULAR | Status: AC
Start: 2016-06-27 — End: 2016-06-28
  Administered 2016-06-27 – 2016-06-28 (×4): 7.5 mg via INTRAVENOUS
  Filled 2016-06-27 (×3): qty 1

## 2016-06-27 MED ORDER — PHENOL 1.4 % MT LIQD: 1.0000 | OROMUCOSAL | Status: DC | PRN

## 2016-06-27 MED ORDER — CEFAZOLIN SODIUM-DEXTROSE 2-4 GM/100ML-% IV SOLN
2.0000 g | INTRAVENOUS | Status: AC
  Administered 2016-06-27: 2 g via INTRAVENOUS
  Filled 2016-06-27: qty 100

## 2016-06-27 MED ORDER — FENTANYL CITRATE (PF) 100 MCG/2ML IJ SOLN
INTRAMUSCULAR | Status: AC
  Filled 2016-06-27: qty 2

## 2016-06-27 MED ORDER — ROCURONIUM BROMIDE 10 MG/ML (PF) SYRINGE
PREFILLED_SYRINGE | INTRAVENOUS | Status: AC
  Filled 2016-06-27: qty 10

## 2016-06-27 MED ORDER — ACETAMINOPHEN 650 MG RE SUPP: 650.0000 mg | Freq: Four times a day (QID) | RECTAL | Status: DC | PRN

## 2016-06-27 MED ORDER — GABAPENTIN 100 MG PO CAPS: 100.0000 mg | ORAL_CAPSULE | Freq: Every day | ORAL | Status: DC | PRN

## 2016-06-27 MED ORDER — ONDANSETRON HCL 4 MG/2ML IJ SOLN
INTRAMUSCULAR | Status: AC
  Filled 2016-06-27: qty 2

## 2016-06-27 MED ORDER — LORATADINE 10 MG PO TABS
10.0000 mg | ORAL_TABLET | Freq: Every day | ORAL | Status: DC
  Administered 2016-06-28 – 2016-07-01 (×4): 10 mg via ORAL
  Filled 2016-06-27 (×5): qty 1

## 2016-06-27 MED ORDER — FLEET ENEMA 7-19 GM/118ML RE ENEM: 1.0000 | ENEMA | Freq: Once | RECTAL | Status: DC | PRN

## 2016-06-27 MED ORDER — MIDAZOLAM HCL 2 MG/2ML IJ SOLN
INTRAMUSCULAR | Status: AC
  Filled 2016-06-27: qty 2

## 2016-06-27 MED ORDER — PHENYLEPHRINE HCL 10 MG/ML IJ SOLN
INTRAMUSCULAR | Status: AC
  Filled 2016-06-27: qty 1

## 2016-06-27 MED ORDER — THROMBIN 20000 UNITS EX KIT
PACK | CUTANEOUS | Status: DC | PRN
  Administered 2016-06-27: 20000 [IU] via TOPICAL

## 2016-06-27 MED ORDER — PROPOFOL 10 MG/ML IV BOLUS
INTRAVENOUS | Status: DC | PRN
  Administered 2016-06-27: 50 mg via INTRAVENOUS
  Administered 2016-06-27: 150 mg via INTRAVENOUS

## 2016-06-27 MED ORDER — FERROUS SULFATE 325 (65 FE) MG PO TABS
325.0000 mg | ORAL_TABLET | Freq: Three times a day (TID) | ORAL | Status: DC
  Administered 2016-06-27 – 2016-07-01 (×12): 325 mg via ORAL
  Filled 2016-06-27 (×12): qty 1

## 2016-06-27 MED ORDER — ROPIVACAINE HCL 7.5 MG/ML IJ SOLN
INTRAMUSCULAR | Status: DC | PRN
  Administered 2016-06-27: 20 mL via PERINEURAL

## 2016-06-27 MED ORDER — KETOROLAC TROMETHAMINE 15 MG/ML IJ SOLN
INTRAMUSCULAR | Status: AC
  Administered 2016-06-27: 7.5 mg via INTRAVENOUS
  Filled 2016-06-27: qty 1

## 2016-06-27 MED ORDER — FENTANYL CITRATE (PF) 100 MCG/2ML IJ SOLN
INTRAMUSCULAR | Status: DC | PRN
  Administered 2016-06-27 (×6): 50 ug via INTRAVENOUS

## 2016-06-27 MED ORDER — ONDANSETRON HCL 4 MG/2ML IJ SOLN: 4.0000 mg | Freq: Four times a day (QID) | INTRAMUSCULAR | Status: DC | PRN

## 2016-06-27 MED ORDER — PHENYLEPHRINE HCL 10 MG/ML IJ SOLN
INTRAMUSCULAR | Status: DC | PRN
  Administered 2016-06-27 (×5): 80 ug via INTRAVENOUS
  Administered 2016-06-27: 160 ug via INTRAVENOUS

## 2016-06-27 MED ORDER — MORPHINE SULFATE (PF) 2 MG/ML IV SOLN
1.0000 mg | INTRAVENOUS | Status: DC | PRN
  Administered 2016-06-27 – 2016-06-28 (×3): 1 mg via INTRAVENOUS
  Filled 2016-06-27 (×2): qty 1

## 2016-06-27 MED ORDER — ONDANSETRON HCL 4 MG/2ML IJ SOLN
INTRAMUSCULAR | Status: DC | PRN
  Administered 2016-06-27: 4 mg via INTRAVENOUS

## 2016-06-27 MED ORDER — METOCLOPRAMIDE HCL 5 MG PO TABS: 5.0000 mg | ORAL_TABLET | Freq: Three times a day (TID) | ORAL | Status: DC | PRN

## 2016-06-27 MED ORDER — LIDOCAINE 2% (20 MG/ML) 5 ML SYRINGE
INTRAMUSCULAR | Status: AC
  Filled 2016-06-27: qty 5

## 2016-06-27 MED ORDER — PNEUMOCOCCAL VAC POLYVALENT 25 MCG/0.5ML IJ INJ
0.5000 mL | INJECTION | INTRAMUSCULAR | Status: DC
  Filled 2016-06-27: qty 0.5

## 2016-06-27 MED ORDER — BRINZOLAMIDE 1 % OP SUSP
1.0000 [drp] | Freq: Two times a day (BID) | OPHTHALMIC | Status: DC
  Administered 2016-06-27 – 2016-06-29 (×5): 1 [drp] via OPHTHALMIC
  Filled 2016-06-27: qty 10

## 2016-06-27 MED ORDER — PHENYLEPHRINE HCL 10 MG/ML IJ SOLN
INTRAVENOUS | Status: DC | PRN
  Administered 2016-06-27: 35 ug/min via INTRAVENOUS

## 2016-06-27 MED ORDER — LATANOPROST 0.005 % OP SOLN
1.0000 [drp] | Freq: Every day | OPHTHALMIC | Status: DC
  Administered 2016-06-27 – 2016-06-30 (×4): 1 [drp] via OPHTHALMIC
  Filled 2016-06-27: qty 2.5

## 2016-06-27 MED ORDER — METHOCARBAMOL 500 MG PO TABS
500.0000 mg | ORAL_TABLET | Freq: Four times a day (QID) | ORAL | Status: DC | PRN
Start: 2016-06-27 — End: 2016-07-01
  Administered 2016-06-27 – 2016-06-30 (×5): 500 mg via ORAL
  Filled 2016-06-27 (×5): qty 1

## 2016-06-27 MED ORDER — ACETAMINOPHEN 325 MG PO TABS: 650.0000 mg | ORAL_TABLET | Freq: Four times a day (QID) | ORAL | Status: DC | PRN

## 2016-06-27 MED ORDER — PHENYLEPHRINE 40 MCG/ML (10ML) SYRINGE FOR IV PUSH (FOR BLOOD PRESSURE SUPPORT)
PREFILLED_SYRINGE | INTRAVENOUS | Status: AC
  Filled 2016-06-27: qty 10

## 2016-06-27 MED ORDER — DOCUSATE SODIUM 100 MG PO CAPS
100.0000 mg | ORAL_CAPSULE | Freq: Two times a day (BID) | ORAL | Status: DC
  Administered 2016-06-27 – 2016-06-28 (×3): 100 mg via ORAL
  Filled 2016-06-27 (×8): qty 1

## 2016-06-27 MED ORDER — LIDOCAINE HCL (CARDIAC) 20 MG/ML IV SOLN
INTRAVENOUS | Status: DC | PRN
  Administered 2016-06-27: 80 mg via INTRAVENOUS

## 2016-06-27 MED ORDER — PROPOFOL 10 MG/ML IV BOLUS
INTRAVENOUS | Status: AC
  Filled 2016-06-27: qty 20

## 2016-06-27 MED ORDER — AMLODIPINE BESYLATE 5 MG PO TABS
5.0000 mg | ORAL_TABLET | Freq: Every day | ORAL | Status: DC
Start: 2016-06-28 — End: 2016-07-01
  Administered 2016-06-29 – 2016-07-01 (×3): 5 mg via ORAL
  Filled 2016-06-27 (×4): qty 1

## 2016-06-27 MED ORDER — OXYCODONE HCL 5 MG PO TABS
5.0000 mg | ORAL_TABLET | ORAL | Status: DC | PRN
  Administered 2016-06-27 – 2016-07-01 (×16): 10 mg via ORAL
  Filled 2016-06-27 (×3): qty 2
  Filled 2016-06-27: qty 1
  Filled 2016-06-27 (×2): qty 2
  Filled 2016-06-27: qty 1
  Filled 2016-06-27 (×10): qty 2

## 2016-06-27 MED ORDER — MOMETASONE FURO-FORMOTEROL FUM 200-5 MCG/ACT IN AERO
2.0000 | INHALATION_SPRAY | Freq: Two times a day (BID) | RESPIRATORY_TRACT | Status: DC
  Filled 2016-06-27: qty 8.8

## 2016-06-27 MED ORDER — MENTHOL 3 MG MT LOZG: 1.0000 | LOZENGE | OROMUCOSAL | Status: DC | PRN

## 2016-06-27 MED ORDER — THROMBIN 20000 UNITS EX KIT
PACK | CUTANEOUS | Status: AC
  Filled 2016-06-27: qty 1

## 2016-06-27 MED ORDER — FENTANYL CITRATE (PF) 100 MCG/2ML IJ SOLN
25.0000 ug | INTRAMUSCULAR | Status: DC | PRN
  Administered 2016-06-27 (×2): 50 ug via INTRAVENOUS

## 2016-06-27 MED ORDER — DEXTROSE 5 % IV SOLN
500.0000 mg | Freq: Four times a day (QID) | INTRAVENOUS | Status: DC | PRN
  Filled 2016-06-27: qty 5

## 2016-06-27 MED ORDER — SUGAMMADEX SODIUM 200 MG/2ML IV SOLN
INTRAVENOUS | Status: AC
  Filled 2016-06-27: qty 2

## 2016-06-27 MED ORDER — BISACODYL 5 MG PO TBEC: 5.0000 mg | DELAYED_RELEASE_TABLET | Freq: Every day | ORAL | Status: DC | PRN

## 2016-06-27 MED ORDER — ROCURONIUM BROMIDE 100 MG/10ML IV SOLN
INTRAVENOUS | Status: DC | PRN
  Administered 2016-06-27: 70 mg via INTRAVENOUS

## 2016-06-27 MED ORDER — ONDANSETRON HCL 4 MG/2ML IJ SOLN: 4.0000 mg | Freq: Once | INTRAMUSCULAR | Status: DC | PRN

## 2016-06-27 MED ORDER — OXYBUTYNIN CHLORIDE 5 MG PO TABS
5.0000 mg | ORAL_TABLET | Freq: Two times a day (BID) | ORAL | Status: DC
  Administered 2016-06-27 – 2016-06-30 (×7): 5 mg via ORAL
  Filled 2016-06-27 (×8): qty 1

## 2016-06-27 MED ORDER — ALUM & MAG HYDROXIDE-SIMETH 200-200-20 MG/5ML PO SUSP: 30.0000 mL | ORAL | Status: DC | PRN

## 2016-06-27 MED ORDER — PROCHLORPERAZINE MALEATE 10 MG PO TABS
10.0000 mg | ORAL_TABLET | Freq: Three times a day (TID) | ORAL | Status: DC | PRN
  Filled 2016-06-27 (×2): qty 1

## 2016-06-27 MED ORDER — ASPIRIN EC 325 MG PO TBEC
325.0000 mg | DELAYED_RELEASE_TABLET | Freq: Every day | ORAL | Status: DC
  Administered 2016-06-28 – 2016-06-29 (×2): 325 mg via ORAL
  Filled 2016-06-27 (×2): qty 1

## 2016-06-27 MED ORDER — SODIUM CHLORIDE 0.9 % IV SOLN
INTRAVENOUS | Status: DC
  Administered 2016-06-27: 75 mL/h via INTRAVENOUS
  Administered 2016-06-27 – 2016-06-29 (×2): via INTRAVENOUS

## 2016-06-27 MED ORDER — MORPHINE SULFATE (PF) 2 MG/ML IV SOLN
INTRAVENOUS | Status: AC
  Filled 2016-06-27: qty 1

## 2016-06-27 MED ORDER — METHOCARBAMOL 500 MG PO TABS
ORAL_TABLET | ORAL | Status: AC
  Administered 2016-06-27: 500 mg via ORAL
  Filled 2016-06-27: qty 1

## 2016-06-27 MED ORDER — LACTATED RINGERS IV SOLN
INTRAVENOUS | Status: DC | PRN
  Administered 2016-06-27 (×2): via INTRAVENOUS

## 2016-06-27 MED ORDER — ONDANSETRON HCL 4 MG PO TABS: 4.0000 mg | ORAL_TABLET | Freq: Four times a day (QID) | ORAL | Status: DC | PRN

## 2016-06-27 MED ORDER — FENTANYL CITRATE (PF) 100 MCG/2ML IJ SOLN
INTRAMUSCULAR | Status: AC
  Administered 2016-06-27: 50 ug via INTRAVENOUS
  Filled 2016-06-27: qty 2

## 2016-06-27 MED ORDER — VITAMIN D 1000 UNITS PO TABS
1000.0000 [IU] | ORAL_TABLET | Freq: Every day | ORAL | Status: DC
  Administered 2016-06-27 – 2016-07-01 (×5): 1000 [IU] via ORAL
  Filled 2016-06-27 (×5): qty 1

## 2016-06-27 MED ORDER — METOCLOPRAMIDE HCL 5 MG/ML IJ SOLN: 5.0000 mg | Freq: Three times a day (TID) | INTRAMUSCULAR | Status: DC | PRN

## 2016-06-27 MED ORDER — OXYCODONE HCL 5 MG PO TABS
ORAL_TABLET | ORAL | Status: AC
  Administered 2016-06-27: 10 mg via ORAL
  Filled 2016-06-27: qty 2

## 2016-06-27 MED ORDER — SODIUM CHLORIDE 0.9 % IR SOLN
Status: DC | PRN
  Administered 2016-06-27: 3000 mL

## 2016-06-27 MED ORDER — POLYETHYLENE GLYCOL 3350 17 G PO PACK: 17.0000 g | PACK | Freq: Every day | ORAL | Status: DC | PRN

## 2016-06-27 MED ORDER — ASPIRIN EC 81 MG PO TBEC: 81.0000 mg | DELAYED_RELEASE_TABLET | Freq: Every day | ORAL | Status: DC

## 2016-06-27 MED ORDER — MONTELUKAST SODIUM 10 MG PO TABS
10.0000 mg | ORAL_TABLET | Freq: Every day | ORAL | Status: DC
  Administered 2016-06-27 – 2016-07-01 (×5): 10 mg via ORAL
  Filled 2016-06-27 (×5): qty 1

## 2016-06-27 SURGICAL SUPPLY — 77 items
ADH SKN CLS APL DERMABOND .7 (GAUZE/BANDAGES/DRESSINGS) ×2
APL SKNCLS STERI-STRIP NONHPOA (GAUZE/BANDAGES/DRESSINGS) ×1
BANDAGE ACE 4X5 VEL STRL LF (GAUZE/BANDAGES/DRESSINGS) ×3 IMPLANT
BANDAGE ACE 6X5 VEL STRL LF (GAUZE/BANDAGES/DRESSINGS) ×2 IMPLANT
BANDAGE ESMARK 6X9 LF (GAUZE/BANDAGES/DRESSINGS) ×1 IMPLANT
BENZOIN TINCTURE PRP APPL 2/3 (GAUZE/BANDAGES/DRESSINGS) ×3 IMPLANT
BLADE SAG 18X100X1.27 (BLADE) ×3 IMPLANT
BLADE SAW SGTL 13X75X1.27 (BLADE) ×3 IMPLANT
BNDG CMPR 9X6 STRL LF SNTH (GAUZE/BANDAGES/DRESSINGS) ×1
BNDG CMPR MED 10X6 ELC LF (GAUZE/BANDAGES/DRESSINGS) ×1
BNDG ELASTIC 6X10 VLCR STRL LF (GAUZE/BANDAGES/DRESSINGS) ×3 IMPLANT
BNDG ESMARK 6X9 LF (GAUZE/BANDAGES/DRESSINGS) ×3
BOWL SMART MIX CTS (DISPOSABLE) ×3 IMPLANT
CAP KNEE TOTAL 3 SIGMA ×2 IMPLANT
CEMENT HV SMART SET (Cement) ×6 IMPLANT
CLOSURE STERI-STRIP 1/2X4 (GAUZE/BANDAGES/DRESSINGS) ×1
CLOSURE WOUND 1/2 X4 (GAUZE/BANDAGES/DRESSINGS) ×2
CLSR STERI-STRIP ANTIMIC 1/2X4 (GAUZE/BANDAGES/DRESSINGS) ×1 IMPLANT
COVER SURGICAL LIGHT HANDLE (MISCELLANEOUS) ×3 IMPLANT
CUFF TOURNIQUET SINGLE 34IN LL (TOURNIQUET CUFF) ×3 IMPLANT
CUFF TOURNIQUET SINGLE 44IN (TOURNIQUET CUFF) IMPLANT
DERMABOND ADVANCED (GAUZE/BANDAGES/DRESSINGS) ×4
DERMABOND ADVANCED .7 DNX12 (GAUZE/BANDAGES/DRESSINGS) ×2 IMPLANT
DRAPE IMP U-DRAPE 54X76 (DRAPES) ×3 IMPLANT
DRAPE ORTHO SPLIT 77X108 STRL (DRAPES) ×6
DRAPE SURG ORHT 6 SPLT 77X108 (DRAPES) ×2 IMPLANT
DRAPE U-SHAPE 47X51 STRL (DRAPES) ×3 IMPLANT
DRSG ADAPTIC 3X8 NADH LF (GAUZE/BANDAGES/DRESSINGS) ×3 IMPLANT
DRSG PAD ABDOMINAL 8X10 ST (GAUZE/BANDAGES/DRESSINGS) ×3 IMPLANT
DURAPREP 26ML APPLICATOR (WOUND CARE) ×3 IMPLANT
ELECT REM PT RETURN 9FT ADLT (ELECTROSURGICAL) ×3
ELECTRODE REM PT RTRN 9FT ADLT (ELECTROSURGICAL) ×1 IMPLANT
EVACUATOR 1/8 PVC DRAIN (DRAIN) IMPLANT
FACESHIELD WRAPAROUND (MASK) ×6 IMPLANT
FACESHIELD WRAPAROUND OR TEAM (MASK) ×2 IMPLANT
GAUZE SPONGE 4X4 12PLY STRL (GAUZE/BANDAGES/DRESSINGS) ×3 IMPLANT
GLOVE BIOGEL PI IND STRL 8 (GLOVE) ×1 IMPLANT
GLOVE BIOGEL PI INDICATOR 8 (GLOVE) ×2
GLOVE ECLIPSE 9.0 STRL (GLOVE) ×3 IMPLANT
GLOVE ORTHO TXT STRL SZ7.5 (GLOVE) ×3 IMPLANT
GLOVE SURG 8.5 LATEX PF (GLOVE) ×3 IMPLANT
GOWN STRL REUS W/ TWL LRG LVL3 (GOWN DISPOSABLE) ×1 IMPLANT
GOWN STRL REUS W/TWL 2XL LVL3 (GOWN DISPOSABLE) ×6 IMPLANT
GOWN STRL REUS W/TWL LRG LVL3 (GOWN DISPOSABLE) ×3
HANDPIECE INTERPULSE COAX TIP (DISPOSABLE) ×3
IMMOBILIZER KNEE 22 UNIV (SOFTGOODS) IMPLANT
KIT BASIN OR (CUSTOM PROCEDURE TRAY) ×3 IMPLANT
KIT ROOM TURNOVER OR (KITS) ×3 IMPLANT
MANIFOLD NEPTUNE II (INSTRUMENTS) ×3 IMPLANT
NDL HYPO 25X1 1.5 SAFETY (NEEDLE) IMPLANT
NEEDLE HYPO 25X1 1.5 SAFETY (NEEDLE) IMPLANT
NS IRRIG 1000ML POUR BTL (IV SOLUTION) ×3 IMPLANT
PACK TOTAL JOINT (CUSTOM PROCEDURE TRAY) ×3 IMPLANT
PACK UNIVERSAL I (CUSTOM PROCEDURE TRAY) ×3 IMPLANT
PAD ARMBOARD 7.5X6 YLW CONV (MISCELLANEOUS) ×6 IMPLANT
PAD CAST 4YDX4 CTTN HI CHSV (CAST SUPPLIES) ×1 IMPLANT
PADDING CAST COTTON 4X4 STRL (CAST SUPPLIES) ×3
PADDING CAST COTTON 6X4 STRL (CAST SUPPLIES) ×3 IMPLANT
SET HNDPC FAN SPRY TIP SCT (DISPOSABLE) ×1 IMPLANT
SPONGE GAUZE 4X4 12PLY STER LF (GAUZE/BANDAGES/DRESSINGS) ×2 IMPLANT
STAPLER VISISTAT 35W (STAPLE) IMPLANT
STRIP CLOSURE SKIN 1/2X4 (GAUZE/BANDAGES/DRESSINGS) ×4 IMPLANT
SUCTION FRAZIER HANDLE 10FR (MISCELLANEOUS)
SUCTION TUBE FRAZIER 10FR DISP (MISCELLANEOUS) IMPLANT
SUT BONE WAX W31G (SUTURE) IMPLANT
SUT VIC AB 0 CT1 27 (SUTURE) ×6
SUT VIC AB 0 CT1 27XBRD ANBCTR (SUTURE) ×2 IMPLANT
SUT VIC AB 1 CT1 27 (SUTURE) ×6
SUT VIC AB 1 CT1 27XBRD ANBCTR (SUTURE) ×2 IMPLANT
SUT VIC AB 2-0 CT1 27 (SUTURE) ×9
SUT VIC AB 2-0 CT1 TAPERPNT 27 (SUTURE) ×3 IMPLANT
SUT VICRYL 4-0 PS2 18IN ABS (SUTURE) ×3 IMPLANT
SYR CONTROL 10ML LL (SYRINGE) IMPLANT
TOWEL OR 17X24 6PK STRL BLUE (TOWEL DISPOSABLE) ×3 IMPLANT
TOWEL OR 17X26 10 PK STRL BLUE (TOWEL DISPOSABLE) ×3 IMPLANT
TRAY FOLEY CATH 16FR SILVER (SET/KITS/TRAYS/PACK) IMPLANT
WATER STERILE IRR 1000ML POUR (IV SOLUTION) ×3 IMPLANT

## 2016-06-27 NOTE — Op Note (Signed)
06/27/2016  10:47 AM  PATIENT:  Jackie Parker  75 y.o. female  MRN: [id number]  OPERATIVE REPORT   PRE-OPERATIVE DIAGNOSIS:  severe right knee osteoarthritis  POST-OPERATIVE DIAGNOSIS:  severe right knee osteoarthritis  PROCEDURE:  Procedure(s): RIGHT TOTAL KNEE ARTHROPLASTY    SURGEON: Jessy Oto, MD      ASSISTANT:  Benjiman Core, PA-C  (Present throughout the entire procedure and necessary for completion of procedure in a timely manner)      ANESTHESIA:  General. Dr.Crews.      COMPLICATIONS:  None.      COMPONENTS:  DePuy Sigma cemented total knee system.  A #2.5 femoral component.  A #3 tibial tray with a 10 mm polyethylene RP tibial spacer , a 35 mm polyethylene patella.    Implant Name Type Inv. Item Serial No. Manufacturer Lot No. LRB No. Used  CEMENT HV SMART SET - [id number] Cement CEMENT HV SMART SET  DEPUY [id number] Right 2  FEMUR RT 2.5 - [id number] Knees FEMUR RT 2.5  DEPUY [id number] Right 1  TIBIA MBT CEMENT - [id number] Knees TIBIA MBT CEMENT  DEPUY [id number] Right 1  PATELLA DOME PFC 35MM - [id number] Knees PATELLA DOME PFC 35MM  DEPUY [id number] Right 1  PLATE ROT INSERT 22WL SIZE 2.5 - [id number] Plate PLATE ROT INSERT 17EY SIZE 2.5   DEPUY [id number] Right 1    COMPONENTS:  DePuy Sigma cemented total knee system.  A #2.5 femoral component.  A #3 tibial tray with a 10 mm polyethylene RP tibial spacer , a 35 mm polyethylene patella.   PROCEDURE:The patient was met in the holding area, and the appropriate right knee identified and marked with "X" and my initials. The patient did not received a preoperative femoral nerve block by anesthesia.  The patient was then transported to OR and was placed on the operative table in a supine position. The patient was then placed under general anesthesia without difficulty. The patient received appropriate preoperative antibiotic prophylaxis. The patien'ts right knee was examined under anesthesia and shown to have 10-100 range of  motion,varus and flexion deformity, ligamentously stable, and normal patella tracking.Tourniquet was applied to the operative thigh. Leg was then prepped using sterile conditions and draped using sterile technique. Time-out procedure was called and correct left knee identified.  The right leg was elevated and Esmarch exsanguinated with a thigh  tourniquet elevated ot 280 mmHg.  Initially, through a 15-cm longitudinal incision based over the patella initial exposure was made. The underlying subcutaneous tissues were incised along with the skin incision. A median arthrotomy was performed revealing an excessive amount of normal-appearing joint fluid, The articular surfaces were inspected. The patient had grade 4 changes medially, grade 4 changes laterally, and grade 4 changes in the patellofemoral joint. Osteophytes were removed from the femoral condyles and the tibial plateau. The medial and lateral meniscal remnants were removed as well as the anterior cruciate ligament.   Attention then turned to the femur where the knee was flexed to 90 degrees and an intramedullary guide placed a drill hole placed just anterior 7 mm to the intercondylar notch on the right distal femur. The distal cutting jig then attached to the intramedulllay nail 5 degrees of valgus for the right knee. A planned 9 mm to be removed off the most prominent condyle, medial in this case. The jig pinned in place and the distal femoral cut performed.  Anterior bow of the femur measured at 0. Based on this then the amount of proximal  tibial cut was determined to be in line with off the depressed medial side of the joint 77m. Using the proximal tibial cutting jig with the leg alignment guide the jig was pinned to the proximal tibia. 3 pins were used 0 degrees of posterior slope.Tibia then subluxed and proximal tibial cut was then performed. Soft tissue tensioning then examined and found to be well balanced in extension. #2.5 femoral component  chosen. The trial placed against the end of the femur and felt to be a good fit. Distal femur cutting jig was then carefully positioned on the distal femur using the end guide and placing 2 pins in the anterior end of the cut distal femur.  2 threaded pins used to pin the jig in place   Rotation guide was then corrected the alignment 2 pins placed over the distal cut surface of the femur for placement of the jig for the chamfer cuts and coronal cuts. This is a carefully placed and held in place with threaded pins. Protecting soft tissues then the anterior cut was made after first verifying with the wing depth of the cut. Then the posterior coronal cuts protect soft tissue structures posteriorly. Posterior chamfer cut and anterior chamfer cut. These were done without difficulty. Cutting guide removed and box cutting jig was then applied to the right distal femur carefully aligned and pinned into place. Box cut was then performed from the distal femur intercondylar box. Proximal tibia was then subluxed the posterior cruciate resected. A #3 plate then attached to the transverse cut surface of the proximal tibia using 2 pins. The upright for the reamer was then applied and reaming carried out for the keel for the tibial component. Using the osteotome was then inserted and impacted into place the temporary fixation pins removed from the proximal tibial plate. Femoral component was inserted using a trial component. Then a 12.5 mm insert placed and the knee brought into full extension full extension to 5 degrees short of full extension and full flexion to 135 withpatella lateral subluxation but no tibial lift off. A right lateral retinacular release performed from one inch above the patella to the level of the tibia joint line. The components were accepted and the permanent #2.5 femoral and #3 tibial components chosen and brought onto the field. Patella was observed to be a width anterior to posterior 26 mm and the  residual 156mwas allowed for the patella component. The cutting jig was then adjusted to allow for 17 mm remaining width. Applied and then the coronal cut the posterior aspect of the patella was performed. Lateral facet of patella showed very little resection. The residual width of the patella post cut measured at 1683mThe trial 35 mm patella was chosen based on the size of the cut surface of patella. The 35 mm drill plate applied and drill holes placed through the posterior aspect of the patella. Trial reduction with the 35 mm trial and the leg placed through range of motion showed excellent motion full extension and flexion 135 patella tendency to elevate medially was clamped in place over the medial retinaculum patellar showed normal appearance with flexion extension. No shuck noted then the knee was stable to varus and valgus at 0 30 and 60. Expose were then removed and the knee was then irrigated with copious amounts of. Solution. A 35 mm permanent patella prosthesis also brought to the field. Cement was mixed the knee was subluxed and carefully soft tissues protected note that the lug holes in  the distal femur where drill using the trial prosthesis and with the drill guide provided. With irrigation completed and permanent tibial components were then inserted into place using cement and a semi-putty state over the proximal aspect of the tibia cement was also applied to the deep surface of the tibial component as well as over the posterior runners of the femur and the deep surface of the patella component. These were then carefully place excess cement removed about the circumference of the tibial tray the femur was then placed applying cement to the cut it distal aspect of the femur and posterior chamfer area anterior chamfer and anterior coronal cut surface small amount within the box. Femoral component was then impacted into place excess cement removed amounts of circumference including the posterior  chamfer area and the posterior femoral condyle area. The trial tibial peg then placed in the tibial component and a 10 mm rotating platform trial was inserted knee brought into full extension observed on the appeared to be in 2 of valgus with full extension 0 of hyperextension. Cement was then applied to the posterior cut surface of the patella within each of the patella peg opening was then pin holes were placed for the cementing along the lateral aspect of the patella. Patella component was then carefully aligned and inserted over the posterior aspect of the patella and clamped in place excess cement was then resected circumferentially using scalpel in order for her to preserve cement the lateral facet area. Cement was allowed to fully harden tourniquet was released while cement was nearly completely hardened. Following this then irrigation was carried down careful inspection demonstrated some small bleeders present over the lateral posterior aspect of the knee joint cauterized using electrocautery off to the medial aspect and the areas of geniculate arteries. There is no active bleeding present then at that time. Trial 10 mm tibial insert demonstrated stability of the anterior drawer and negative and the knee stable to varus valgus stress and 30 and 60 flexion and full extension. A 10 mm rotating platform insert was then chosen this was applied to the tibia using the permanent implant removing the trial examining tibia and determined there was no evident residual cement remaining. Also examined posterior runners I posterior aspect of the femoral condyles for any residual cement none was present. The tibial insert in place the reduced placed through range of motion and full extension  and flexion to 130 knee stable to varus stress at 0 30 and 60 anterior drawer negative. This component was accepted.Tourniquet released at 99 minutes.  A drain was not necessary the synovium reapproximated over the medial aspect  of the knee with 0 Vicryl sutures.  The retinaculum of the right knee and the quadriceps tendon were then reapproximated with interrupted #1 Vicryl sutures. Peritenon of the reapproximated with interrupted 0 Vicryl sutures deep subcutaneous layers approximated with interrupted 0 and 2-0 Vicryl sutures and the skin closed with a running subcutaneous stitch of 4-0 Vicryl. Dermabond was then applied. 30 cc of marcaine 1/2%1:1 exparel 1.3%instilled into the right knee via the hemovac drain. Adaptic 4 x 4's ABDs pads fixed to the skin with sterile labrum an Ace wrap applied from the foot to the right upper thigh and knee immobilizer. All instrument and sponge counts were correct. Then reactivated extubated and returned to recovery room in satisfactory condition.  Benjiman Core, PA-C perform the duties of assistant surgeon he performed careful retraction of soft tissues assisted in addition the patient had removal on  the OR table is present beginning the case the case and performed closure of the incision from the peritenon to the skin and application of dressing.       Jessy Oto 06/27/2016, 10:47 AM

## 2016-06-27 NOTE — Anesthesia Procedure Notes (Signed)
Procedure Name: Intubation Date/Time: 06/27/2016 7:46 AM Performed by: Scheryl Darter Pre-anesthesia Checklist: Patient identified, Emergency Drugs available, Suction available and Patient being monitored Patient Re-evaluated:Patient Re-evaluated prior to inductionOxygen Delivery Method: Circle System Utilized Preoxygenation: Pre-oxygenation with 100% oxygen Intubation Type: IV induction Ventilation: Mask ventilation without difficulty Laryngoscope Size: Mac and 3 Grade View: Grade I Tube type: Oral Tube size: 7.5 mm Number of attempts: 1 Airway Equipment and Method: Stylet and Oral airway Placement Confirmation: ETT inserted through vocal cords under direct vision,  positive ETCO2 and breath sounds checked- equal and bilateral Secured at: 23 cm Tube secured with: Tape Dental Injury: Teeth and Oropharynx as per pre-operative assessment

## 2016-06-27 NOTE — Interval H&P Note (Signed)
Patient was seen and examined in the preop holding area. There has been no interval  Change in this patient's exam preop  history and physical exam  Lab tests and images have been examined and reviewed.  The Risks benefits and alternative treatments have been discussed  extensively,questions answered.  The patient has elected to undergo the discussed surgical treatment.

## 2016-06-27 NOTE — H&P (Signed)
TOTAL KNEE ADMISSION H&P  Patient is being admitted for right total knee arthroplasty.  Subjective:  Chief Complaint:right knee pain.  HPI: Jackie Parker, 75 y.o. female, has a history of pain and functional disability in the right knee due to arthritis and has failed non-surgical conservative treatments for greater than 12 weeks to includeNSAID's and/or analgesics, corticosteriod injections, viscosupplementation injections, use of assistive devices and activity modification.  Onset of symptoms was gradual, starting 10 years ago with gradually worsening course since that time..  Patient currently rates pain in the right knee(s) at 10 out of 10 with activity. Patient has night pain, worsening of pain with activity and weight bearing, pain that interferes with activities of daily living and joint swelling.  Patient has evidence of subchondral cysts, subchondral sclerosis, periarticular osteophytes and joint space narrowing by imaging studies.  There is no active infection.  Patient Active Problem List   Diagnosis Date Noted  . Osteoarthritis of left knee 12/14/2015    Class: End Stage  . Localized osteoarthritis of left knee 12/14/2015  . Small bowel obstruction, partial 08/30/2013  . Acute renal failure ([place]) 08/30/2013  . 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 N&V (nausea and vomiting) 08/30/2013  . Abdominal pain, acute 08/30/2013  . SBO (small bowel obstruction) 08/30/2013  . Hypokalemia 08/30/2013  . Partial small bowel obstruction 08/30/2013  . Postmenopausal bleeding 03/18/2013  . OSTEOARTHRITIS, KNEE, LEFT 03/20/2010  . TRICUSPID REGURGITATION, MILD 03/03/2010  . RENAL INSUFFICIENCY 03/02/2010  . MORBID OBESITY 02/18/2010  . PREMATURE ATRIAL CONTRACTIONS 02/18/2010  . PREMATURE VENTRICULAR CONTRACTIONS 02/18/2010  . CARDIAC MURMUR 02/18/2010  . EDEMA 10/20/2009  . HYPOKALEMIA 07/30/2009  . RIB PAIN, RIGHT SIDED 05/26/2008  . ABDOMINAL PAIN, LEFT LOWER QUADRANT 12/31/2007   . ALLERGIC RHINITIS 06/25/2007  . ABNORMAL BLOOD CHEMISTRY , OTHER 06/25/2007  . UNSPECIFIED VITAMIN D DEFICIENCY 04/30/2007  . HYPOCALCEMIA 04/19/2007  . GLAUCOMA NEC 04/19/2007  . HYPERTENSION, ESSENTIAL NOS 04/19/2007  . ASTHMA 04/19/2007  . COLITIS, ULCERATIVE NOS 04/19/2007  . DISORDER, MENSTRUAL NEC 04/19/2007  . PSORIASIS 04/19/2007  . 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 GOUT NOS 02/21/2006   Past Medical History:  Diagnosis Date  . AKI (acute kidney injury) ([place]) 12/2015  . Allergy    takes Singulair and Zyrtec daily  . Asthma    uses Adair daily  . Cataracts, bilateral   . Colitis, ulcerative ([place])   . DJD (degenerative joint disease)   . Glaucoma    uses eye drops daily  . History of blood transfusion    no abnormal reaction noted. 40+ yrs ago  . History of bronchitis    yrs ago  . History of gout    not on any meds  . Hypertension    takes Amlodipine daily  . Joint pain   . Joint swelling   . Psoriasis   . Urinary frequency    takes Ditropan daily  . Vertigo    doesn't take any meds  . Vitamin D deficiency     Past Surgical History:  Procedure Laterality Date  . BACK SURGERY    . COLONOSCOPY    . ILEOSTOMY    . KNEE ARTHROPLASTY Left 12/14/2015   Procedure: COMPUTER ASSISTED LEFT TOTAL KNEE ARTHROPLASTY;  Surgeon: Jessy Oto, MD;  Location: [facility];  Service: Orthopedics;  Laterality: Left;  . TOTAL KNEE ARTHROPLASTY Left 12/14/2015  . TUBAL LIGATION      Prescriptions Prior to Admission  Medication Sig Dispense  Refill Last Dose  . amLODipine (NORVASC) 5 MG tablet Take 5 mg by mouth daily.   06/27/2016 at 0400  . aspirin EC 81 MG tablet Take 81 mg by mouth daily.   06/24/2016  . brinzolamide (AZOPT) 1 % ophthalmic suspension Place 1 drop into the right eye 2 (two) times daily.   06/27/2016 at Unknown time  . cetirizine (ZYRTEC) 10 MG tablet Take 10 mg by mouth daily.  5 06/26/2016 at 0400  . cholecalciferol (VITAMIN D) 1000 units tablet  Take 1,000 Units by mouth daily.   06/26/2016 at Unknown time  . Fluticasone-Salmeterol (ADVAIR) 250-50 MCG/DOSE AEPB Inhale 1 puff into the lungs every 12 (twelve) hours.   06/27/2016 at Unknown time  . gabapentin (NEURONTIN) 100 MG capsule Take 100 mg by mouth daily as needed. For back pain.   Past Week at Unknown time  . LUMIGAN 0.01 % SOLN Place 1 drop into both eyes at bedtime.   06/26/2016 at Unknown time  . montelukast (SINGULAIR) 10 MG tablet Take 10 mg by mouth daily.    06/26/2016 at Unknown time  . oxybutynin (DITROPAN) 5 MG tablet Take 5 mg by mouth 2 (two) times daily.   06/27/2016 at 0400  . prochlorperazine (COMPAZINE) 10 MG tablet Take 10 mg by mouth 2 (two) times daily as needed. For nausea/vomiting.   More than a month at Unknown time   Allergies  Allergen Reactions  . Sulfonamide Derivatives Rash    Social History  Substance Use Topics  . Smoking status: Never Smoker  . Smokeless tobacco: Never Used  . Alcohol use No    Family History  Problem Relation Age of Onset  . Hypertension Sister   . Glaucoma Sister      Review of Systems  Constitutional: Negative.   HENT: Negative.   Eyes: Negative.   Respiratory: Negative.   Cardiovascular: Negative.   Gastrointestinal: Negative.   Genitourinary: Negative.   Musculoskeletal: Positive for joint pain.  Skin: Negative.   Neurological: Negative.   Psychiatric/Behavioral: Negative.     Objective:  Physical Exam  Constitutional: She is oriented to person, place, and time. No distress.  HENT:  Head: Normocephalic and atraumatic.  Eyes: EOM are normal. Pupils are equal, round, and reactive to light.  Neck: Normal range of motion.  Cardiovascular: Normal rate.   Respiratory: No respiratory distress.  GI: She exhibits no distension.  Musculoskeletal: She exhibits tenderness.  Neurological: She is alert and oriented to person, place, and time.  Skin: Skin is warm and dry.  Psychiatric: She has a normal mood and  affect.    Vital signs in last 24 hours: Temp:  [98.3 F (36.8 C)] 98.3 F (36.8 C) (10/16 0642) Pulse Rate:  [86] 86 (10/16 0642) Resp:  [20] 20 (10/16 0642) BP: (137)/(76) 137/76 (10/16 0642) SpO2:  [98 %] 98 % (10/16 0642) Weight:  [87.1 kg (192 lb)] 87.1 kg (192 lb) (10/16 0258)  Labs:   Estimated body mass index is 35.12 kg/m as calculated from the following:   Height as of 06/23/16: 5' 2"  (1.575 m).   Weight as of this encounter: 87.1 kg (192 lb).   Imaging Review Plain radiographs demonstrate moderate degenerative joint disease of the right knee(s). The overall alignment ismild varus. The bone quality appears to be good for age and reported activity level.  Assessment/Plan:  End stage arthritis, right knee   The patient history, physical examination, clinical judgment of the provider and imaging studies are consistent with  end stage degenerative joint disease of the right knee(s) and total knee arthroplasty is deemed medically necessary. The treatment options including medical management, injection therapy arthroscopy and arthroplasty were discussed at length. The risks and benefits of total knee arthroplasty were presented and reviewed. The risks due to aseptic loosening, infection, stiffness, patella tracking problems, thromboembolic complications and other imponderables were discussed. The patient acknowledged the explanation, agreed to proceed with the plan and consent was signed. Patient is being admitted for inpatient treatment for surgery, pain control, PT, OT, prophylactic antibiotics, VTE prophylaxis, progressive ambulation and ADL's and discharge planning. The patient is planning to be discharged home with home health services Patient examined and lab reviewed with Ricard Dillon, PA-C.

## 2016-06-27 NOTE — Transfer of Care (Signed)
Immediate Anesthesia Transfer of Care Note  Patient: Jackie Parker  Procedure(s) Performed: Procedure(s): RIGHT TOTAL KNEE ARTHROPLASTY (Right)  Patient Location: PACU  Anesthesia Type:General and Regional  Level of Consciousness: awake, alert , oriented and sedated  Airway & Oxygen Therapy: Patient Spontanous Breathing and Patient connected to nasal cannula oxygen  Post-op Assessment: Report given to RN, Post -op Vital signs reviewed and stable and Patient moving all extremities  Post vital signs: Reviewed and stable  Last Vitals:  Vitals:   06/27/16 0642 06/27/16 1115  BP: 137/76   Pulse: 86   Resp: 20   Temp: 36.8 C 36.8 C    Last Pain:  Vitals:   06/27/16 1115  PainSc: 10-Worst pain ever      Patients Stated Pain Goal: 5 (83/75/42 3702)  Complications: No apparent anesthesia complications

## 2016-06-27 NOTE — Progress Notes (Signed)
Orthopedic Tech Progress Note Patient Details:  Jackie Parker 29-Aug-1941 [id number]  Ortho Devices Type of Ortho Device: Knee Immobilizer   [name] 06/27/2016, 1:02 PM

## 2016-06-27 NOTE — Brief Op Note (Signed)
PATIENT ID:      Jackie Parker  MRN:     [id number] DOB/AGE:    05-21-1941 / 75 y.o.       OPERATIVE REPORT   DATE OF PROCEDURE:  06/27/2016      PREOPERATIVE DIAGNOSIS:   severe right knee osteoarthritis                                                       Body mass index is 35.12 kg/m.    POSTOPERATIVE DIAGNOSIS:   severe right knee osteoarthritis                                                                     Body mass index is 35.12 kg/m.    PROCEDURE:  Procedure(s): RIGHT TOTAL KNEE ARTHROPLASTY    SURGEON: Jessy Oto   ASSISTANT: Esaw Grandchild          ANESTHESIA:  General and Dr. Al Corpus.  EBL:150CC  DRAINS:Foley to SD.  COMPONENTS:   Implant Name Type Inv. Item Serial No. Manufacturer Lot No. LRB No. Used  CEMENT HV SMART SET - [id number] Cement CEMENT HV SMART SET  DEPUY [id number] Right 2  FEMUR RT 2.5 - [id number] Knees FEMUR RT 2.5  DEPUY [id number] Right 1  TIBIA MBT CEMENT - [id number] Knees TIBIA MBT CEMENT  DEPUY [id number] Right 1  PATELLA DOME PFC 35MM - [id number] Knees PATELLA DOME PFC 35MM  DEPUY [id number] Right 1  PLATE ROT INSERT 62MB SIZE 2.5 - [id number] Plate PLATE ROT INSERT 45XM SIZE 2.5   DEPUY [id number] Right 1    TOURNIQUET TIME:1 HR 39 MIN @ 224 MM HG  COMPLICATIONS:  None   CONDITION:  stable    Jessy Oto 06/27/2016, 10:42 AM

## 2016-06-27 NOTE — Anesthesia Procedure Notes (Cosign Needed)
Anesthesia Regional Block:  Adductor canal block  Pre-Anesthetic Checklist: ,, timeout performed, Correct Patient, Correct Site, Correct Laterality, Correct Procedure, Correct Position, site marked, Risks and benefits discussed,  Surgical consent,  Pre-op evaluation,  At surgeon's request and post-op pain management  Laterality: Right  Prep: chloraprep       Needles:  Injection technique: Single-shot  Needle Type: Echogenic Needle     Needle Length: 9cm 9 cm Needle Gauge: 21 and 21 G    Additional Needles:  Procedures: ultrasound guided (picture in chart) Adductor canal block Narrative:  Injection made incrementally with aspirations every 5 mL.  Performed by: Personally  Anesthesiologist: Catalina Gravel  Additional Notes: No pain on injection. No increased resistance to injection. Injection made in 5cc increments.  Good needle visualization.  Patient tolerated procedure well.

## 2016-06-27 NOTE — Discharge Instructions (Signed)
Keep knee incision dry for 5 days post op then may wet while bathing. Therapy daily and CPM goal full extension and greater than 90 degrees flexion. Call if fever or chills or increased drainage. Go to ER if acutely short of breath or call for ambulance. Return for follow up in 2 weeks. May full weight bear on the surgical leg unless told otherwise. Use knee immobilizer until able to straight leg raise off bed with knee stable. In house walking for first 2 weeks. INSTRUCTIONS AFTER JOINT REPLACEMENT   o Remove items at home which could result in a fall. This includes throw rugs or furniture in walking pathways o ICE to the affected joint every three hours while awake for 30 minutes at a time, for at least the first 3-5 days, and then as needed for pain and swelling.  Continue to use ice for pain and swelling. You may notice swelling that will progress down to the foot and ankle.  This is normal after surgery.  Elevate your leg when you are not up walking on it.   o Continue to use the breathing machine you got in the hospital (incentive spirometer) which will help keep your temperature down.  It is common for your temperature to cycle up and down following surgery, especially at night when you are not up moving around and exerting yourself.  The breathing machine keeps your lungs expanded and your temperature down.   DIET:  As you were doing prior to hospitalization, we recommend a well-balanced diet.  DRESSING / WOUND CARE / SHOWERING  You may change your dressing 3-5 days after surgery.  Then change the dressing every day with sterile gauze.  Please use good hand washing techniques before changing the dressing.  Do not use any lotions or creams on the incision until instructed by your surgeon.  ACTIVITY  o Increase activity slowly as tolerated, but follow the weight bearing instructions below.   o No driving for 6 weeks or until further direction given by your physician.  You cannot  drive while taking narcotics.  o No lifting or carrying greater than 10 lbs. until further directed by your surgeon. o Avoid periods of inactivity such as sitting longer than an hour when not asleep. This helps prevent blood clots.  o You may return to work once you are authorized by your doctor.     WEIGHT BEARING   Weight bearing as tolerated with assist device (walker, cane, etc) as directed, use it as long as suggested by your surgeon or therapist, typically at least 4-6 weeks.   EXERCISES  Results after joint replacement surgery are often greatly improved when you follow the exercise, range of motion and muscle strengthening exercises prescribed by your doctor. Safety measures are also important to protect the joint from further injury. Any time any of these exercises cause you to have increased pain or swelling, decrease what you are doing until you are comfortable again and then slowly increase them. If you have problems or questions, call your caregiver or physical therapist for advice.   Rehabilitation is important following a joint replacement. After just a few days of immobilization, the muscles of the leg can become weakened and shrink (atrophy).  These exercises are designed to build up the tone and strength of the thigh and leg muscles and to improve motion. Often times heat used for twenty to thirty minutes before working out will loosen up your tissues and help with improving the range  of motion but do not use heat for the first two weeks following surgery (sometimes heat can increase post-operative swelling).   These exercises can be done on a training (exercise) mat, on the floor, on a table or on a bed. Use whatever works the best and is most comfortable for you.    Use music or television while you are exercising so that the exercises are a pleasant break in your day. This will make your life better with the exercises acting as a break in your routine that you can look forward  to.   Perform all exercises about fifteen times, three times per day or as directed.  You should exercise both the operative leg and the other leg as well.  Exercises include:    Quad Sets - Tighten up the muscle on the front of the thigh (Quad) and hold for 5-10 seconds.    Straight Leg Raises - With your knee straight (if you were given a brace, keep it on), lift the leg to 60 degrees, hold for 3 seconds, and slowly lower the leg.  Perform this exercise against resistance later as your leg gets stronger.   Leg Slides: Lying on your back, slowly slide your foot toward your buttocks, bending your knee up off the floor (only go as far as is comfortable). Then slowly slide your foot back down until your leg is flat on the floor again.   Angel Wings: Lying on your back spread your legs to the side as far apart as you can without causing discomfort.   Hamstring Strength:  Lying on your back, push your heel against the floor with your leg straight by tightening up the muscles of your buttocks.  Repeat, but this time bend your knee to a comfortable angle, and push your heel against the floor.  You may put a pillow under the heel to make it more comfortable if necessary.   A rehabilitation program following joint replacement surgery can speed recovery and prevent re-injury in the future due to weakened muscles. Contact your doctor or a physical therapist for more information on knee rehabilitation.    CONSTIPATION  Constipation is defined medically as fewer than three stools per week and severe constipation as less than one stool per week.  Even if you have a regular bowel pattern at home, your normal regimen is likely to be disrupted due to multiple reasons following surgery.  Combination of anesthesia, postoperative narcotics, change in appetite and fluid intake all can affect your bowels.   YOU MUST use at least one of the following options; they are listed in order of increasing strength to get  the job done.  They are all available over the counter, and you may need to use some, POSSIBLY even all of these options:    Drink plenty of fluids (prune juice may be helpful) and high fiber foods Colace 100 mg by mouth twice a day  Senokot for constipation as directed and as needed Dulcolax (bisacodyl), take with full glass of water  Miralax (polyethylene glycol) once or twice a day as needed.  If you have tried all these things and are unable to have a bowel movement in the first 3-4 days after surgery call either your surgeon or your primary doctor.    If you experience loose stools or diarrhea, hold the medications until you stool forms back up.  If your symptoms do not get better within 1 week or if they get worse, check with your  doctor.  If you experience "the worst abdominal pain ever" or develop nausea or vomiting, please contact the office immediately for further recommendations for treatment.   ITCHING:  If you experience itching with your medications, try taking only a single pain pill, or even half a pain pill at a time.  You can also use Benadryl over the counter for itching or also to help with sleep.   TED HOSE STOCKINGS:  Use stockings on both legs until for at least 2 weeks or as directed by physician office. They may be removed at night for sleeping.  MEDICATIONS:  See your medication summary on the After Visit Summary that nursing will review with you.  You may have some home medications which will be placed on hold until you complete the course of blood thinner medication.  It is important for you to complete the blood thinner medication as prescribed.  PRECAUTIONS:  If you experience chest pain or shortness of breath - call 911 immediately for transfer to the hospital emergency department.   If you develop a fever greater that 101 F, purulent drainage from wound, increased redness or drainage from wound, foul odor from the wound/dressing, or calf pain - CONTACT YOUR  SURGEON.                                                   FOLLOW-UP APPOINTMENTS:  If you do not already have a post-op appointment, please call the office for an appointment to be seen by your surgeon.  Guidelines for how soon to be seen are listed in your After Visit Summary, but are typically between 1-4 weeks after surgery.  OTHER INSTRUCTIONS:   Knee Replacement:  Do not place pillow under knee, focus on keeping the knee straight while resting. CPM instructions: 0-90 degrees, 2 hours in the morning, 2 hours in the afternoon, and 2 hours in the evening. Place foam block, curve side up under heel at all times except when in CPM or when walking.  DO NOT modify, tear, cut, or change the foam block in any way.  MAKE SURE YOU:   Understand these instructions.   Get help right away if you are not doing well or get worse.    Thank you for letting us be a part of your medical care team.  It is a privilege we respect greatly.  We hope these instructions will help you stay on track for a fast and full recovery!

## 2016-06-27 NOTE — Evaluation (Signed)
Physical Therapy Evaluation Patient Details Name: Jackie Parker MRN: [id number] DOB: 09-26-1940 Today's Date: 06/27/2016   History of Present Illness  Pt is a 75 y.o. female now s/p Rt TKA. PMH: Lt TKA, vertigo, HTN, asthma,  back surgery.   Clinical Impression  Pt is s/p TKA resulting in the deficits listed below (see PT Problem List). Pt able to ambulate 5 ft with rw and min guard assist during initial PT session. Pt will benefit from skilled PT to increase their independence and safety with mobility to allow discharge to home with family and friends to assist.       Follow Up Recommendations Home health PT;Supervision for mobility/OOB    Equipment Recommendations   (pt unsure of where rw is, family checking on.)    Recommendations for Other Services       Precautions / Restrictions Precautions Precautions: Knee;Fall Precaution Booklet Issued: Yes (comment) Precaution Comments: HEP provided, reviewed knee extension precautions Required Braces or Orthoses: Knee Immobilizer - Right Knee Immobilizer - Right: Discontinue once straight leg raise with < 10 degree lag Restrictions Weight Bearing Restrictions: Yes RLE Weight Bearing: Weight bearing as tolerated      Mobility  Bed Mobility Overal bed mobility: Needs Assistance Bed Mobility: Supine to Sit     Supine to sit: Min assist     General bed mobility comments: Min assist provided with Rt LE, pt using rail to assist  Transfers Overall transfer level: Needs assistance Equipment used: Rolling walker (2 wheeled) Transfers: Sit to/from Stand Sit to Stand: Min assist         General transfer comment: cues for hand position  Ambulation/Gait Ambulation/Gait assistance: Min guard Ambulation Distance (Feet): 5 Feet Assistive device: Rolling walker (2 wheeled) Gait Pattern/deviations: Step-to pattern;Decreased weight shift to right;Decreased stance time - right Gait velocity: decreased   General Gait Details:  slow pattern, cues for posture  Stairs            Wheelchair Mobility    Modified Rankin (Stroke Patients Only)       Balance Overall balance assessment: Needs assistance Sitting-balance support: No upper extremity supported Sitting balance-Leahy Scale: Fair     Standing balance support: Bilateral upper extremity supported Standing balance-Leahy Scale: Poor Standing balance comment: using rw                             Pertinent Vitals/Pain Pain Assessment: 0-10 Pain Score: 6  Pain Location: Rt knee Pain Descriptors / Indicators: Aching Pain Intervention(s): Limited activity within patient's tolerance;Monitored during session;Ice applied    Home Living Family/patient expects to be discharged to:: Private residence Living Arrangements: Children Available Help at Discharge: Family;Friend(s);Available 24 hours/day Type of Home: House Home Access: Stairs to enter Entrance Stairs-Rails: Left Entrance Stairs-Number of Steps: 3 Home Layout: One level Home Equipment: Bedside commode (Not sure where walker is) Additional Comments: daughter and friend will be staying with her    Prior Function Level of Independence: Independent               Hand Dominance        Extremity/Trunk Assessment   Upper Extremity Assessment: Overall WFL for tasks assessed           Lower Extremity Assessment: RLE deficits/detail RLE Deficits / Details: unable to perform SLR       Communication   Communication: No difficulties  Cognition Arousal/Alertness: Awake/alert Behavior During Therapy: WFL for tasks assessed/performed Overall Cognitive  Status: Within Functional Limits for tasks assessed                      General Comments      Exercises     Assessment/Plan    PT Assessment Patient needs continued PT services  PT Problem List Decreased strength;Decreased range of motion;Decreased activity tolerance;Decreased balance;Decreased  mobility          PT Treatment Interventions DME instruction;Gait training;Stair training;Functional mobility training;Therapeutic activities;Therapeutic exercise;Balance training;Patient/family education    PT Goals (Current goals can be found in the Care Plan section)  Acute Rehab PT Goals Patient Stated Goal: go home  PT Goal Formulation: With patient Time For Goal Achievement: 07/11/16 Potential to Achieve Goals: Good    Frequency 7X/week   Barriers to discharge        Co-evaluation               End of Session Equipment Utilized During Treatment: Gait belt;Right knee immobilizer Activity Tolerance: Patient tolerated treatment well Patient left: in chair;with call bell/phone within reach Nurse Communication: Mobility status;Weight bearing status         Time: 2376-2831 PT Time Calculation (min) (ACUTE ONLY): 23 min   Charges:   PT Evaluation $PT Eval Moderate Complexity: 1 Procedure PT Treatments $Therapeutic Activity: 8-22 mins   PT G Codes:        Cassell Clement, PT, CSCS Pager [phone] Office [phone]  06/27/2016, 4:21 PM

## 2016-06-27 NOTE — Progress Notes (Signed)
Orthopedic Tech Progress Note Patient Details:  Jackie Parker 07-14-1941 [id number]  CPM Right Knee CPM Right Knee: On Right Knee Flexion (Degrees): 60 Right Knee Extension (Degrees): 0 Additional Comments: Trapeze bar and foot roll   Jackie Parker 06/27/2016, 12:02 PM

## 2016-06-28 ENCOUNTER — Encounter (HOSPITAL_COMMUNITY): Payer: Self-pay | Admitting: Specialist

## 2016-06-28 LAB — BASIC METABOLIC PANEL
Anion gap: 6 (ref 5–15)
BUN: 10 mg/dL (ref 6–20)
CO2: 24 mmol/L (ref 22–32)
Calcium: 7.2 mg/dL — ABNORMAL LOW (ref 8.9–10.3)
Chloride: 108 mmol/L (ref 101–111)
Creatinine, Ser: 1.48 mg/dL — ABNORMAL HIGH (ref 0.44–1.00)
GFR calc Af Amer: 39 mL/min — ABNORMAL LOW (ref >60)
GFR calc non Af Amer: 33 mL/min — ABNORMAL LOW (ref >60)
Glucose, Bld: 103 mg/dL — ABNORMAL HIGH (ref 65–99)
Potassium: 3.6 mmol/L (ref 3.5–5.1)
Sodium: 138 mmol/L (ref 135–145)

## 2016-06-28 LAB — CBC
HCT: 31.8 % — ABNORMAL LOW (ref 36.0–46.0)
Hemoglobin: 10.6 g/dL — ABNORMAL LOW (ref 12.0–15.0)
MCH: 31.1 pg (ref 26.0–34.0)
MCHC: 33.3 g/dL (ref 30.0–36.0)
MCV: 93.3 fL (ref 78.0–100.0)
Platelets: 167 10*3/uL (ref 150–400)
RBC: 3.41 MIL/uL — ABNORMAL LOW (ref 3.87–5.11)
RDW: 14.2 % (ref 11.5–15.5)
WBC: 16 10*3/uL — ABNORMAL HIGH (ref 4.0–10.5)

## 2016-06-28 MED ORDER — SODIUM CHLORIDE 0.9 % IV BOLUS (SEPSIS)
500.0000 mL | Freq: Once | INTRAVENOUS | Status: AC
  Administered 2016-06-28: 500 mL via INTRAVENOUS

## 2016-06-28 NOTE — Progress Notes (Signed)
Physical Therapy Treatment Patient Details Name: Jackie Parker MRN: [id number] DOB: 1940/12/23 Today's Date: 06/28/2016    History of Present Illness Pt is a 75 y.o. female now s/p Rt TKA. PMH: Lt TKA, vertigo, HTN, asthma,  back surgery.     PT Comments    Pt able to progress her ambulation to 25 ft with rw and min guard assistance. Pt reports pain as the primary limiting factor at this time. PT to continue to follow and attempt to advance mobility and independence as tolerated in anticipation of DC to home.   Follow Up Recommendations  Home health PT;Supervision for mobility/OOB     Equipment Recommendations   (pt checking to see if family can find her rw)    Recommendations for Other Services       Precautions / Restrictions Precautions Precautions: Knee;Fall Precaution Booklet Issued: No Precaution Comments: Reviewed knee precautions during ADL. Required Braces or Orthoses: Knee Immobilizer - Right Knee Immobilizer - Right: Discontinue once straight leg raise with < 10 degree lag Restrictions Weight Bearing Restrictions: Yes RLE Weight Bearing: Weight bearing as tolerated    Mobility  Bed Mobility Overal bed mobility: Needs Assistance Bed Mobility: Supine to Sit     Supine to sit: Min assist     General bed mobility comments: in chair upon arrival  Transfers Overall transfer level: Needs assistance Equipment used: Rolling walker (2 wheeled) Transfers: Sit to/from Stand Sit to Stand: Min assist         General transfer comment: cues for hand placement  Ambulation/Gait Ambulation/Gait assistance: Min guard Ambulation Distance (Feet): 25 Feet Assistive device: Rolling walker (2 wheeled) Gait Pattern/deviations: Step-through pattern;Decreased stance time - right;Decreased weight shift to right Gait velocity: decreased   General Gait Details: slow pattern, cues for WBAT.    Stairs            Wheelchair Mobility    Modified Rankin (Stroke  Patients Only)       Balance Overall balance assessment: Needs assistance Sitting-balance support: No upper extremity supported Sitting balance-Leahy Scale: Good     Standing balance support: Bilateral upper extremity supported Standing balance-Leahy Scale: Poor Standing balance comment: using rw                    Cognition Arousal/Alertness: Awake/alert Behavior During Therapy: WFL for tasks assessed/performed Overall Cognitive Status: Within Functional Limits for tasks assessed                      Exercises Total Joint Exercises Ankle Circles/Pumps: AROM;Both;10 reps Quad Sets: Strengthening;Right;10 reps Heel Slides: AAROM;Right;10 reps Straight Leg Raises: Strengthening;Right;10 reps (mod assist) Goniometric ROM: 40 degrees Rt knee flexion    General Comments        Pertinent Vitals/Pain Pain Assessment: Faces Pain Score: 8  Faces Pain Scale: Hurts whole lot Pain Location: Rt knee Pain Descriptors / Indicators: Grimacing;Guarding Pain Intervention(s): Limited activity within patient's tolerance;Monitored during session (declined ice)    Home Living Family/patient expects to be discharged to:: Private residence Living Arrangements: Children Available Help at Discharge: Family;Friend(s);Available 24 hours/day Type of Home: House Home Access: Stairs to enter Entrance Stairs-Rails: Left Home Layout: One level Home Equipment: Bedside commode;Shower seat (Unsure of location of RW) Additional Comments: daughter and friend will be staying with her    Prior Function Level of Independence: Independent          PT Goals (current goals can now be found in the care plan section)  Acute Rehab PT Goals Patient Stated Goal: less pain PT Goal Formulation: With patient Time For Goal Achievement: 07/11/16 Potential to Achieve Goals: Good Progress towards PT goals: Progressing toward goals    Frequency    7X/week      PT Plan Current plan  remains appropriate    Co-evaluation             End of Session Equipment Utilized During Treatment: Gait belt;Right knee immobilizer Activity Tolerance: Patient limited by pain Patient left: in bed;with call bell/phone within reach     Time: 0071-2197 PT Time Calculation (min) (ACUTE ONLY): 24 min  Charges:  $Gait Training: 8-22 mins $Therapeutic Exercise: 8-22 mins                    G Codes:      Cassell Clement, PT, CSCS Pager [phone] Office [phone]  06/28/2016, 11:25 AM

## 2016-06-28 NOTE — Anesthesia Postprocedure Evaluation (Signed)
Anesthesia Post Note  Patient: Jackie Parker  Procedure(s) Performed: Procedure(s) (LRB): RIGHT TOTAL KNEE ARTHROPLASTY (Right)  Patient location during evaluation: PACU Anesthesia Type: General and Regional Level of consciousness: awake and alert Pain management: pain level controlled Vital Signs Assessment: post-procedure vital signs reviewed and stable Respiratory status: spontaneous breathing, nonlabored ventilation, respiratory function stable and patient connected to nasal cannula oxygen Cardiovascular status: blood pressure returned to baseline and stable Postop Assessment: no signs of nausea or vomiting Anesthetic complications: no     Last Vitals:  Vitals:   06/27/16 1300 06/27/16 2300  BP: 127/60 (!) 111/51  Pulse: 70 (!) 102  Resp: 15 16  Temp: 36.5 C 37.2 C    Last Pain:  Vitals:   06/28/16 0235  TempSrc:   PainSc: 3    Pain Goal: Patients Stated Pain Goal: 5 (06/27/16 1215)               Catalina Gravel

## 2016-06-28 NOTE — Progress Notes (Signed)
Orthopedic Tech Progress Note Patient Details:  Jackie Parker 1941/06/14 [id number]  Patient ID: [name], female   DOB: July 16, 1941, 75 y.o.   MRN: [id number] Pt refused cpm. Wants to take pain meds first. Will call when ready for cpm.  [name] 06/28/2016, 5:55 AM

## 2016-06-28 NOTE — Progress Notes (Addendum)
Subjective: Doing well.  Pain controlled.     Objective: Vital signs in last 24 hours: Temp:  [97.7 F (36.5 C)-100.5 F (38.1 C)] 100.5 F (38.1 C) (10/17 0700) Pulse Rate:  [63-107] 107 (10/17 0700) Resp:  [12-16] 16 (10/17 0700) BP: (96-140)/(51-88) 96/56 (10/17 0700) SpO2:  [95 %-100 %] 95 % (10/17 0700)  Intake/Output from previous day: 10/16 0701 - 10/17 0700 In: 2113.8 [I.V.:2113.8] Out: 1625 [Urine:1450; Blood:175] Intake/Output this shift: Total I/O In: 240 [P.O.:240] Out: -    Recent Labs  06/28/16 0920  HGB 10.6*    Recent Labs  06/28/16 0920  WBC 16.0*  RBC 3.41*  HCT 31.8*  PLT 167    Recent Labs  06/28/16 0920  NA 138  K 3.6  CL 108  CO2 24  BUN 10  CREATININE 1.48*  GLUCOSE 103*  CALCIUM 7.2*    Recent Labs  06/27/16 0617  INR 1.17    Exam:  Alert and oriented.  Dressing C/D/I  Calf nontender, NVI.    Assessment/Plan: Will see how she does with PT today.  Anticipate d/c home within next couple of days if she is moving well.  D/c morphine.   Creatinine is increased as is her BUN probably sign of decreased volume. Will give 500cc fluid NS bolus and increase IV rate to 100 cc/HR today.  Check BMET in am.  Jessy Oto 06/28/2016, 11:52 AM   Patient examined and lab reviewed with Ricard Dillon, PA-C.

## 2016-06-28 NOTE — Progress Notes (Signed)
Physical Therapy Treatment Patient Details Name: Jackie Parker MRN: [id number] DOB: 08/16/1941 Today's Date: 06/28/2016    History of Present Illness Pt is a 75 y.o. female now s/p Rt TKA. PMH: Lt TKA, vertigo, HTN, asthma,  back surgery.     PT Comments    Pt able to increase her ambulation distance to 55 ft with rw and min guard assistance. Pt reports pain better controlled during this session as well. PT to continue to follow to progress mobility in anticipation of DC home.   Follow Up Recommendations  Home health PT;Supervision for mobility/OOB     Equipment Recommendations   (pt checking to see if family can find her rw)    Recommendations for Other Services       Precautions / Restrictions Precautions Precautions: Knee;Fall Required Braces or Orthoses: Knee Immobilizer - Right Knee Immobilizer - Right: Discontinue once straight leg raise with < 10 degree lag Restrictions Weight Bearing Restrictions: Yes RLE Weight Bearing: Weight bearing as tolerated    Mobility  Bed Mobility Overal bed mobility: Needs Assistance Bed Mobility: Sit to Supine       Sit to supine: Mod assist   General bed mobility comments: mod assist bilateral LEs  Transfers Overall transfer level: Needs assistance Equipment used: Rolling walker (2 wheeled) Transfers: Sit to/from Stand Sit to Stand: Min assist         General transfer comment: cues for hand placement  Ambulation/Gait Ambulation/Gait assistance: Min guard Ambulation Distance (Feet): 55 Feet Assistive device: Rolling walker (2 wheeled) Gait Pattern/deviations: Step-through pattern;Decreased weight shift to right Gait velocity: decreased   General Gait Details: Cues for weightbearing and even strides   Stairs            Wheelchair Mobility    Modified Rankin (Stroke Patients Only)       Balance Overall balance assessment: Needs assistance Sitting-balance support: No upper extremity supported Sitting  balance-Leahy Scale: Good     Standing balance support: Bilateral upper extremity supported Standing balance-Leahy Scale: Poor Standing balance comment: using rw                    Cognition Arousal/Alertness: Awake/alert Behavior During Therapy: WFL for tasks assessed/performed Overall Cognitive Status: Within Functional Limits for tasks assessed                      Exercises      General Comments        Pertinent Vitals/Pain Pain Assessment: Faces Faces Pain Scale: Hurts little more Pain Location: Rt knee Pain Descriptors / Indicators: Aching Pain Intervention(s): Limited activity within patient's tolerance;Monitored during session    Home Living                      Prior Function            PT Goals (current goals can now be found in the care plan section) Acute Rehab PT Goals Patient Stated Goal: less pain PT Goal Formulation: With patient Time For Goal Achievement: 07/11/16 Potential to Achieve Goals: Good Progress towards PT goals: Progressing toward goals    Frequency    7X/week      PT Plan Current plan remains appropriate    Co-evaluation             End of Session Equipment Utilized During Treatment: Gait belt;Right knee immobilizer Activity Tolerance: Patient limited by pain Patient left: in bed;with call bell/phone within reach  Time: 9842-1031 PT Time Calculation (min) (ACUTE ONLY): 22 min  Charges:  $Gait Training: 8-22 mins                    G Codes:      Cassell Clement, PT, CSCS Pager [phone] Office [phone]  06/28/2016, 4:39 PM

## 2016-06-28 NOTE — Progress Notes (Signed)
PT Cancellation Note  Patient Details Name: Jackie Parker MRN: [id number] DOB: 10-Jun-1941   Cancelled Treatment:    Reason Eval/Treat Not Completed: Pain limiting ability to participate. Pt states that she just got some pain medication. Will check back as able.    Cassell Clement, PT, CSCS Pager [phone] Office [phone]  06/28/2016, 10:01 AM

## 2016-06-28 NOTE — Evaluation (Signed)
Occupational Therapy Evaluation Patient Details Name: Jackie Parker MRN: [id number] DOB: 08/30/1941 Today's Date: 06/28/2016    History of Present Illness Pt is a 75 y.o. female now s/p Rt TKA. PMH: Lt TKA, vertigo, HTN, asthma,  back surgery.    Clinical Impression   PTA, pt was independent with ADL and IADL. Pt limited by pain this date but agreeable to session. RN gave pain meds during session. Currently pt requires min assist with functional mobility and mod assist with LB ADL. Pt plans to D/ C home with 24 hour assist from family. Recommend 24 hour assistance/supervision upon D/C from acute stay. Recommend 3-in-1 BSC for DME needs. Pt does not need OT follow-up post-acute D/C. Pt would benefit from continued OT services while admitted to improve independence with ADL. OT will continue to follow.    Follow Up Recommendations  Supervision/Assistance - 24 hour;No OT follow up    Equipment Recommendations  3 in 1 bedside comode    Recommendations for Other Services       Precautions / Restrictions Precautions Precautions: Knee;Fall Precaution Booklet Issued: No Precaution Comments: Reviewed knee precautions during ADL. Required Braces or Orthoses: Knee Immobilizer - Right Knee Immobilizer - Right: Discontinue once straight leg raise with < 10 degree lag Restrictions Weight Bearing Restrictions: Yes RLE Weight Bearing: Weight bearing as tolerated      Mobility Bed Mobility Overal bed mobility: Needs Assistance Bed Mobility: Supine to Sit     Supine to sit: Min assist     General bed mobility comments: Min assist to move RLE off of bed.  Transfers Overall transfer level: Needs assistance Equipment used: Rolling walker (2 wheeled) Transfers: Sit to/from Stand Sit to Stand: Min assist         General transfer comment: VC's for sequence and hand position.    Balance Overall balance assessment: Needs assistance Sitting-balance support: No upper extremity  supported;Feet supported Sitting balance-Leahy Scale: Fair     Standing balance support: Bilateral upper extremity supported;During functional activity Standing balance-Leahy Scale: Poor                              ADL Overall ADL's : Needs assistance/impaired     Grooming: Set up;Sitting   Upper Body Bathing: Set up;Sitting   Lower Body Bathing: Moderate assistance;Sit to/from stand   Upper Body Dressing : Set up;Sitting   Lower Body Dressing: Moderate assistance;Sit to/from stand   Toilet Transfer: BSC;RW;Ambulation;Minimal assistance   Toileting- Water quality scientist and Hygiene: Min guard;Sit to/from stand       Functional mobility during ADLs: Rolling walker;Minimal assistance General ADL Comments: Pt educated on knee precautions during ADL, no pillow under kene, dressing techniques, and use of ice for pain management.     Vision Vision Assessment?: No apparent visual deficits   Perception     Praxis      Pertinent Vitals/Pain Pain Assessment: 0-10 Pain Score: 8  Pain Location: R knee Pain Descriptors / Indicators: Aching Pain Intervention(s): Limited activity within patient's tolerance;Monitored during session;Ice applied     Hand Dominance Right   Extremity/Trunk Assessment Upper Extremity Assessment Upper Extremity Assessment: Overall WFL for tasks assessed   Lower Extremity Assessment Lower Extremity Assessment: RLE deficits/detail RLE Deficits / Details: Decreased ROM and strength as expected post-operatively.       Communication Communication Communication: No difficulties   Cognition Arousal/Alertness: Awake/alert Behavior During Therapy: WFL for tasks assessed/performed Overall Cognitive Status: Within Functional  Limits for tasks assessed                     General Comments       Exercises       Shoulder Instructions      Home Living Family/patient expects to be discharged to:: Private residence Living  Arrangements: Children Available Help at Discharge: Family;Friend(s);Available 24 hours/day Type of Home: House Home Access: Stairs to enter [employer] of Steps: 3 Entrance Stairs-Rails: Left Home Layout: One level     Bathroom Shower/Tub: Tub/shower unit Shower/tub characteristics: Door Biochemist, clinical: Standard Bathroom Accessibility: Yes How Accessible: Accessible via walker Home Equipment: Shower seat (Unsure of location of RW)   Additional Comments: daughter and friend will be staying with her      Prior Functioning/Environment Level of Independence: Independent                 OT Problem List: Decreased strength;Decreased range of motion;Decreased activity tolerance;Impaired balance (sitting and/or standing);Decreased knowledge of use of DME or AE;Decreased knowledge of precautions;Decreased safety awareness;Pain   OT Treatment/Interventions: Self-care/ADL training;Therapeutic exercise;Neuromuscular education;Patient/family education;Balance training;Therapeutic activities    OT Goals(Current goals can be found in the care plan section) Acute Rehab OT Goals Patient Stated Goal: go home  OT Goal Formulation: With patient Time For Goal Achievement: 07/12/16 Potential to Achieve Goals: Good ADL Goals Pt Will Perform Lower Body Bathing: with supervision;sit to/from stand Pt Will Perform Lower Body Dressing: with supervision;sit to/from stand Pt Will Transfer to Toilet: with supervision;ambulating;bedside commode Pt Will Perform Tub/Shower Transfer: Tub transfer;shower seat;3 in 1;rolling walker;ambulating  OT Frequency: Min 2X/week   Barriers to D/C:            Co-evaluation              End of Session Equipment Utilized During Treatment: Gait belt;Rolling walker CPM Right Knee CPM Right Knee: Off Nurse Communication: Patient requests pain meds  Activity Tolerance: Patient limited by pain Patient left: in chair;with call bell/phone within  reach   Time: 0924-0951 OT Time Calculation (min): 27 min Charges:  OT General Charges $OT Visit: 1 Procedure OT Treatments $Self Care/Home Management : 8-22 mins G-Codes:    Xander Jutras A Nahomi Hegner 07-17-16, 10:00 AM

## 2016-06-28 NOTE — Progress Notes (Signed)
Subjective: Doing well.  Pain controlled.     Objective: Vital signs in last 24 hours: Temp:  [97.7 F (36.5 C)-98.9 F (37.2 C)] 98.9 F (37.2 C) (10/16 2300) Pulse Rate:  [63-102] 102 (10/16 2300) Resp:  [12-18] 16 (10/16 2300) BP: (111-140)/(51-94) 111/51 (10/16 2300) SpO2:  [97 %-100 %] 97 % (10/16 2300)  Intake/Output from previous day: 10/16 0701 - 10/17 0700 In: 2113.8 [I.V.:2113.8] Out: 1625 [Urine:1450; Blood:175] Intake/Output this shift: No intake/output data recorded.  No results for input(s): HGB in the last 72 hours. No results for input(s): WBC, RBC, HCT, PLT in the last 72 hours. No results for input(s): NA, K, CL, CO2, BUN, CREATININE, GLUCOSE, CALCIUM in the last 72 hours.  Recent Labs  06/27/16 0617  INR 1.17    Exam:  Alert and oriented.  Dressing C/D/I  Calf nontender, NVI.    Assessment/Plan: Will see how she does with PT today.  Anticipate d/c home within next couple of days if she is moving well.  D/c morphine.     Quintan Saldivar M 06/28/2016, 7:43 AM

## 2016-06-29 ENCOUNTER — Inpatient Hospital Stay (HOSPITAL_COMMUNITY): Payer: Medicare Other

## 2016-06-29 DIAGNOSIS — N179 Acute kidney failure, unspecified: Secondary | ICD-10-CM

## 2016-06-29 DIAGNOSIS — D62 Acute posthemorrhagic anemia: Secondary | ICD-10-CM

## 2016-06-29 DIAGNOSIS — R5082 Postprocedural fever: Secondary | ICD-10-CM

## 2016-06-29 DIAGNOSIS — D72829 Elevated white blood cell count, unspecified: Secondary | ICD-10-CM

## 2016-06-29 DIAGNOSIS — D649 Anemia, unspecified: Secondary | ICD-10-CM

## 2016-06-29 DIAGNOSIS — R509 Fever, unspecified: Secondary | ICD-10-CM

## 2016-06-29 LAB — BASIC METABOLIC PANEL
Anion gap: 6 (ref 5–15)
BUN: 16 mg/dL (ref 6–20)
CO2: 22 mmol/L (ref 22–32)
Calcium: 7.6 mg/dL — ABNORMAL LOW (ref 8.9–10.3)
Chloride: 106 mmol/L (ref 101–111)
Creatinine, Ser: 1.65 mg/dL — ABNORMAL HIGH (ref 0.44–1.00)
GFR calc Af Amer: 34 mL/min — ABNORMAL LOW (ref >60)
GFR calc non Af Amer: 29 mL/min — ABNORMAL LOW (ref >60)
Glucose, Bld: 96 mg/dL (ref 65–99)
Potassium: 4.4 mmol/L (ref 3.5–5.1)
Sodium: 134 mmol/L — ABNORMAL LOW (ref 135–145)

## 2016-06-29 LAB — CBC
HCT: 30 % — ABNORMAL LOW (ref 36.0–46.0)
Hemoglobin: 10 g/dL — ABNORMAL LOW (ref 12.0–15.0)
MCH: 31.3 pg (ref 26.0–34.0)
MCHC: 33.3 g/dL (ref 30.0–36.0)
MCV: 94 fL (ref 78.0–100.0)
Platelets: 159 10*3/uL (ref 150–400)
RBC: 3.19 MIL/uL — ABNORMAL LOW (ref 3.87–5.11)
RDW: 14.2 % (ref 11.5–15.5)
WBC: 21.9 10*3/uL — ABNORMAL HIGH (ref 4.0–10.5)

## 2016-06-29 LAB — PROCALCITONIN: Procalcitonin: 0.11 ng/mL

## 2016-06-29 MED ORDER — HYDRALAZINE HCL 20 MG/ML IJ SOLN: 5.0000 mg | Freq: Three times a day (TID) | INTRAMUSCULAR | Status: DC | PRN

## 2016-06-29 MED ORDER — SODIUM CHLORIDE 0.9 % IV SOLN
1.0000 g | Freq: Once | INTRAVENOUS | Status: AC
  Administered 2016-06-29: 1 g via INTRAVENOUS
  Filled 2016-06-29: qty 10

## 2016-06-29 NOTE — Progress Notes (Signed)
Orthopedic Tech Progress Note Patient Details:  Jackie Parker [birth year] [id number]  Patient ID: Jackie Parker, female   DOB: Mar 14, 1941, 75 y.o.   MRN: [id number] Applied cpm 0-50  Jackie Parker 06/29/2016, 6:35 AM

## 2016-06-29 NOTE — Consult Note (Signed)
CSW consult for SNF placement. Per PT, pt appropriate for HHPT. CSW signing off. Please re-consult if CSW needs arise.   Wandra Feinstein, MSW, LCSW [phone] (coverage)

## 2016-06-29 NOTE — Progress Notes (Signed)
Occupational Therapy Treatment Patient Details Name: Jackie Parker MRN: [id number] DOB: 14-Feb-1941 Today's Date: 06/29/2016    History of present illness Pt is a 75 y.o. female now s/p Rt TKA. PMH: Lt TKA, vertigo, HTN, asthma,  back surgery.    OT comments  Pt progressing with functional mobility and ADL independence and currently requires mod assist for LB ADL, min guard and increased time for functional mobility and ADL while standing. Continued education on safe toilet and shower transfers with pt and she requires reinforcement. Continue to recommend 24 hour assistance upon D/C home. OT will continue to follow with focus on LB ADL independence.    Follow Up Recommendations  Supervision/Assistance - 24 hour;No OT follow up    Equipment Recommendations  3 in 1 bedside comode       Precautions / Restrictions Precautions Precautions: Knee;Fall Precaution Booklet Issued: No Precaution Comments: Reviewed knee precautions during ADL. Required Braces or Orthoses: Knee Immobilizer - Right Knee Immobilizer - Right: Discontinue once straight leg raise with < 10 degree lag Restrictions Weight Bearing Restrictions: Yes RLE Weight Bearing: Weight bearing as tolerated       Mobility Bed Mobility Overal bed mobility: Needs Assistance Bed Mobility: Supine to Sit     Supine to sit: Min assist        Transfers Overall transfer level: Needs assistance Equipment used: Rolling walker (2 wheeled) Transfers: Sit to/from Stand Sit to Stand: Min guard              Balance Overall balance assessment: Needs assistance Sitting-balance support: No upper extremity supported Sitting balance-Leahy Scale: Good     Standing balance support: Single extremity supported Standing balance-Leahy Scale: Poor Standing balance comment: Able to remove single UE briefly for pericare.                   ADL Overall ADL's : Needs assistance/impaired     Grooming: Min  guard;Standing;Oral care   Upper Body Bathing: Set up;Sitting   Lower Body Bathing: Moderate assistance;Sit to/from stand   Upper Body Dressing : Set up;Sitting   Lower Body Dressing: Moderate assistance;Sit to/from stand   Toilet Transfer: Min guard;BSC;RW;Ambulation   Toileting- Water quality scientist and Hygiene: Min guard;Sit to/from stand   Tub/ Shower Transfer: Walk-in shower;Min guard;Ambulation;3 in 1;Rolling walker   Functional mobility during ADLs: Min guard;Rolling walker General ADL Comments: Pt requires increased time with all ADL and functional mobility. Reviewed knee precautions during ADL, use of ice for pain management, and safe walk in shower transfer. Pt reports that she has no step up into shower.                Cognition   Behavior During Therapy: WFL for tasks assessed/performed Overall Cognitive Status: Within Functional Limits for tasks assessed                                    Pertinent Vitals/ Pain       Pain Assessment: 0-10 Pain Score: 8  Pain Location: R knee Pain Descriptors / Indicators: Aching Pain Intervention(s): Limited activity within patient's tolerance;Monitored during session;Repositioned;Other (comment) (Offered ice but pt declined.)         Frequency  Min 2X/week        Progress Toward Goals  OT Goals(current goals can now be found in the care plan section)  Progress towards OT goals: Progressing toward goals  Acute Rehab OT  Goals Patient Stated Goal: less pain OT Goal Formulation: With patient Time For Goal Achievement: 07/12/16 Potential to Achieve Goals: Good ADL Goals Pt Will Perform Lower Body Bathing: with supervision;sit to/from stand Pt Will Perform Lower Body Dressing: with supervision;sit to/from stand Pt Will Transfer to Toilet: with supervision;ambulating;bedside commode Pt Will Perform Tub/Shower Transfer: Tub transfer;shower seat;3 in 1;rolling walker;ambulating  Plan Discharge plan  remains appropriate       End of Session Equipment Utilized During Treatment: Gait belt;Rolling walker CPM Right Knee CPM Right Knee: On Right Knee Flexion (Degrees): 50 Right Knee Extension (Degrees): 0   Activity Tolerance Patient tolerated treatment well   Patient Left in chair;with call bell/phone within reach           Time: 0913-1001 OT Time Calculation (min): 48 min  Charges: OT General Charges $OT Visit: 1 Procedure OT Treatments $Self Care/Home Management : 38-52 mins  Norman Herrlich, OTR/L [phone] 06/29/2016, 10:24 AM

## 2016-06-29 NOTE — Progress Notes (Signed)
Physical Therapy Treatment Patient Details Name: Jackie Parker MRN: [id number] DOB: 08/16/1941 Today's Date: 06/29/2016    History of Present Illness Pt is a 75 y.o. female now s/p Rt TKA. PMH: Lt TKA, vertigo, HTN, asthma,  back surgery.     PT Comments    Pt presented sitting OOB in recliner when PT entered room. Pt making steady progress towards achieving her goals. PT planning to perform stair training at next session if appropriate. Pt would continue to benefit from skilled physical therapy services at this time while admitted and after d/c to address her limitations in order to improve her overall safety and independence with functional mobility.   Follow Up Recommendations  Home health PT;Supervision for mobility/OOB     Equipment Recommendations  Other (comment) (pt checking with family regarding her RW)    Recommendations for Other Services       Precautions / Restrictions Precautions Precautions: Knee;Fall Precaution Booklet Issued: No Precaution Comments: Reviewed knee precautions during ADL. Required Braces or Orthoses: Knee Immobilizer - Right Knee Immobilizer - Right: Discontinue once straight leg raise with < 10 degree lag Restrictions Weight Bearing Restrictions: Yes RLE Weight Bearing: Weight bearing as tolerated    Mobility  Bed Mobility Overal bed mobility: Needs Assistance Bed Mobility: Supine to Sit     Supine to sit: Min assist     General bed mobility comments: pt sitting OOB in recliner when PT entered room  Transfers Overall transfer level: Needs assistance Equipment used: Rolling walker (2 wheeled) Transfers: Sit to/from Stand Sit to Stand: Min guard         General transfer comment: pt required VC'ing for bilateral hand placement  Ambulation/Gait Ambulation/Gait assistance: Min guard Ambulation Distance (Feet): 75 Feet Assistive device: Rolling walker (2 wheeled) Gait Pattern/deviations: Step-to pattern;Decreased step length -  left;Decreased stance time - right;Decreased weight shift to right;Trunk flexed Gait velocity: decreased Gait velocity interpretation: Below normal speed for age/gender General Gait Details: pt required min guard for safety and VC'ing for forward gaze   Stairs            Wheelchair Mobility    Modified Rankin (Stroke Patients Only)       Balance Overall balance assessment: Needs assistance Sitting-balance support: Feet supported;No upper extremity supported Sitting balance-Leahy Scale: Fair     Standing balance support: During functional activity;Bilateral upper extremity supported Standing balance-Leahy Scale: Poor Standing balance comment: pt reliant on bilateral UEs on RW                    Cognition Arousal/Alertness: Awake/alert Behavior During Therapy: WFL for tasks assessed/performed Overall Cognitive Status: Within Functional Limits for tasks assessed                      Exercises Total Joint Exercises Quad Sets: Strengthening;Right;10 reps;Seated Heel Slides: AAROM;Right;10 reps;Seated Long Arc Quad: AAROM;Right;10 reps;Seated Knee Flexion: AAROM;Right;10 reps;Seated Goniometric ROM: Flexion = 52 degrees; Extension = lacking 12 degrees to neutral; measured in sitting    General Comments        Pertinent Vitals/Pain Pain Assessment: 0-10 Pain Score: 9  Pain Location: R knee Pain Descriptors / Indicators: Sore;Grimacing;Guarding Pain Intervention(s): Monitored during session;Repositioned    Home Living                      Prior Function            PT Goals (current goals can now be found in  the care plan section) Acute Rehab PT Goals Patient Stated Goal: decrease pain PT Goal Formulation: With patient Time For Goal Achievement: 07/11/16 Potential to Achieve Goals: Good Progress towards PT goals: Progressing toward goals    Frequency    7X/week      PT Plan Current plan remains appropriate     Co-evaluation             End of Session Equipment Utilized During Treatment: Gait belt;Right knee immobilizer Activity Tolerance: Patient limited by pain;Patient limited by fatigue Patient left: in chair;with call bell/phone within reach     Time: 1033-1057 PT Time Calculation (min) (ACUTE ONLY): 24 min  Charges:  $Gait Training: 8-22 mins $Therapeutic Exercise: 8-22 mins                    G Codes:      Clearnce Sorrel Norva Bowe Jul 16, 2016, 11:14 AM Sherie Don, PT, DPT [phone]

## 2016-06-29 NOTE — Progress Notes (Signed)
Physical Therapy Treatment Patient Details Name: Jackie Parker MRN: [id number] DOB: [birth year] Today's Date: 06/29/2016    History of Present Illness Pt is a 75 y.o. female now s/p Rt TKA. PMH: Lt TKA, vertigo, HTN, asthma,  back surgery.     PT Comments    Pt presented sitting OOB in recliner when PT entered room. Pt declining stair training at this time secondary to increased pain in L knee. Pt agreeable to participating in therapeutic exercises and light gait during this session. Pt would continue to benefit from skilled physical therapy services at this time while admitted and after d/c to address her limitations in order to improve her overall safety and independence with functional mobility.   Follow Up Recommendations  Home health PT;Supervision for mobility/OOB     Equipment Recommendations  Other (comment) (pt checking with family regarding RW)    Recommendations for Other Services       Precautions / Restrictions Precautions Precautions: Knee;Fall Required Braces or Orthoses: Knee Immobilizer - Right Knee Immobilizer - Right: Discontinue once straight leg raise with < 10 degree lag Restrictions Weight Bearing Restrictions: Yes RLE Weight Bearing: Weight bearing as tolerated    Mobility  Bed Mobility               General bed mobility comments: pt sitting OOB in recliner when PT entered room  Transfers Overall transfer level: Needs assistance Equipment used: Rolling walker (2 wheeled) Transfers: Sit to/from Stand Sit to Stand: Min guard         General transfer comment: pt required VC'ing for bilateral hand placement  Ambulation/Gait Ambulation/Gait assistance: Min guard Ambulation Distance (Feet): 30 Feet Assistive device: Rolling walker (2 wheeled) Gait Pattern/deviations: Step-to pattern;Decreased step length - left;Decreased stance time - right;Decreased weight shift to right;Trunk flexed Gait velocity: decreased Gait velocity  interpretation: Below normal speed for age/gender General Gait Details: pt required min guard for safety and VC'ing for forward gaze   Stairs            Wheelchair Mobility    Modified Rankin (Stroke Patients Only)       Balance Overall balance assessment: Needs assistance Sitting-balance support: Feet supported;No upper extremity supported Sitting balance-Leahy Scale: Fair     Standing balance support: During functional activity;Bilateral upper extremity supported Standing balance-Leahy Scale: Poor Standing balance comment: pt reliant on bilateral UEs on RW                    Cognition Arousal/Alertness: Awake/alert Behavior During Therapy: WFL for tasks assessed/performed Overall Cognitive Status: Within Functional Limits for tasks assessed                      Exercises Total Joint Exercises Quad Sets: Strengthening;Right;10 reps;Seated Heel Slides: AAROM;Right;10 reps;Seated Hip ABduction/ADduction: AROM;Right;10 reps;Seated Straight Leg Raises: AAROM;Right;10 reps;Seated Long Arc Quad: AAROM;Right;10 reps;Seated Knee Flexion: AAROM;Right;10 reps;Seated Marching in Standing: AROM;Both;10 reps;Standing    General Comments        Pertinent Vitals/Pain Pain Assessment: Faces Faces Pain Scale: Hurts little more Pain Location: R knee Pain Descriptors / Indicators: Sore;Grimacing;Guarding Pain Intervention(s): Monitored during session;Repositioned;Ice applied    Home Living                      Prior Function            PT Goals (current goals can now be found in the care plan section) Acute Rehab PT Goals Patient Stated Goal: decrease  pain PT Goal Formulation: With patient Time For Goal Achievement: 07/11/16 Potential to Achieve Goals: Good Progress towards PT goals: Progressing toward goals    Frequency    7X/week      PT Plan Current plan remains appropriate    Co-evaluation             End of Session  Equipment Utilized During Treatment: Gait belt Activity Tolerance: Patient limited by pain Patient left: in chair;with call bell/phone within reach     Time: 3494-9447 PT Time Calculation (min) (ACUTE ONLY): 21 min  Charges:  $Gait Training: 8-22 mins                    G CodesClearnce Sorrel Ellyssa Zagal 26-Jul-2016, 5:38 PM Sherie Don, [place], DPT [phone]

## 2016-06-29 NOTE — Progress Notes (Signed)
     Subjective: 2 Days Post-Op Procedure(s) (LRB): RIGHT TOTAL KNEE ARTHROPLASTY (Right) Awake, alert and oriented x 4. This side is not as swollen and the other was. Tolerating po meds and nourishment. No BM Positive flatus.Does have a history of renal insufficiency. Patient reports pain as moderate.    Objective:   VITALS:  Temp:  [98 F (36.7 C)-99.5 F (37.5 C)] 98 F (36.7 C) (10/18 0300) Pulse Rate:  [92-102] 92 (10/18 0300) Resp:  [16] 16 (10/18 0300) BP: (101-120)/(54-68) 104/68 (10/18 0300) SpO2:  [98 %-100 %] 100 % (10/18 0300)  Neurologically intact ABD soft Neurovascular intact Sensation intact distally Intact pulses distally Dorsiflexion/Plantar flexion intact Incision: no drainage No cellulitis present Compartment soft   LABS  Recent Labs  06/28/16 0920 06/29/16 0716  HGB 10.6* 10.0*  WBC 16.0* 21.9*  PLT 167 159    Recent Labs  06/28/16 0920 06/29/16 0716  NA 138 134*  K 3.6 4.4  CL 108 106  CO2 24 22  BUN 10 16  CREATININE 1.48* 1.65*  GLUCOSE 103* 96    Recent Labs  06/27/16 0617  INR 1.17     Assessment/Plan: 2 Days Post-Op Procedure(s) (LRB): RIGHT TOTAL KNEE ARTHROPLASTY (Right)No sign of operative site infection. Increased WBC 21K, Creatinine is increased 1.65 BUN 16  Advance diet Up with therapy  [facility] Consult due to increased Creatinine and BUN may need to consider change of anticoagulation for anti-DVT prophylaxis. Continue with IVF.    Jessy Oto 06/29/2016, 12:55 PM

## 2016-06-29 NOTE — Consult Note (Signed)
History and Physical    Jackie Parker CBU:[id number] DOB: 01-Nov-1940 DOA: 06/27/2016   PCP: Philis Fendt, MD   Patient coming from:  Home   Chief Complaint: Worsening renal insufficiency                               Fever                               Elevated White Count                                Hypocalcemia  HPI: Jackie Parker is a 75 y.o. female  who was admitted for R TKR on 10/16, having initially tolerated the procedure well without complications. However, since admission, it was noted that her lab showed abnormalities for which [facility] was asked to see in consultation : Pertinent data:  Ca 8.9->7.6 Cr 1.5-> 1.65 WBC 16->21.9 (she was taking until prior to admission prednisone as recommended by her Ortho MD ) Hb dropped from 14.8->10   Denies fevers today, but yesterday her Tmax was 100.5; denies chills, night sweats, vision changes, or mucositis. Denies any respiratory complaints.She was recovering from strep throat prior to admission, treated with amoxicillin. At the time of admission, she had no dysphagia or neck swelling.  Denies any chest pain or palpitations. Denies lower extremity swelling on the left. The right is post operatively swollen, but she denies any worsening tenderness. She has been ambulating with assistance without any issues. Denies nausea, heartburn or change in bowel habits. Denies abdominal pain.Of note, patient has a remote total colectomy with  colostomy since 1977 for ulcerative colitis -the patient does not wish any of her family to know about this, especially her daughter who is a Network engineer here at [employer]-. Appetite is normal. Denies any dysuria and urinating normally. Denies abnormal skin rashes, or neuropathy. Denies any bleeding issues such as epistaxis, hematemesis, hematuria or hematochezia. She has been increasingly thirsty over the last few days.  Review of Systems: As per HPI otherwise 10 point review of systems negative.    Past Medical History:  Diagnosis Date  . AKI (acute kidney injury) ([place]) 12/2015  . Allergy    takes Singulair and Zyrtec daily  . Asthma    uses Adair daily  . Cataracts, bilateral   . Colitis, ulcerative ([place])   . DJD (degenerative joint disease)   . Glaucoma    uses eye drops daily  . History of blood transfusion    no abnormal reaction noted. 40+ yrs ago  . History of bronchitis    yrs ago  . History of gout    not on any meds  . Hypertension    takes Amlodipine daily  . Joint pain   . Joint swelling   . Psoriasis   . Urinary frequency    takes Ditropan daily  . Vertigo    doesn't take any meds  . Vitamin D deficiency     Past Surgical History:  Procedure Laterality Date  . BACK SURGERY    . COLONOSCOPY    . ILEOSTOMY    . KNEE ARTHROPLASTY Left 12/14/2015   Procedure: COMPUTER ASSISTED LEFT TOTAL KNEE ARTHROPLASTY;  Surgeon: Jessy Oto, MD;  Location: [facility];  Service: Orthopedics;  Laterality: Left;  . TOTAL KNEE  ARTHROPLASTY Left 12/14/2015  . TOTAL KNEE ARTHROPLASTY Right 06/27/2016  . TOTAL KNEE ARTHROPLASTY Right 06/27/2016   Procedure: RIGHT TOTAL KNEE ARTHROPLASTY;  Surgeon: Jessy Oto, MD;  Location: [facility];  Service: Orthopedics;  Laterality: Right;  . TUBAL LIGATION      Social History Social History   Social History  . Marital status: Single    Spouse name: N/A  . Number of children: N/A  . Years of education: N/A   Occupational History  . Not on file.   Social History Main Topics  . Smoking status: Never Smoker  . Smokeless tobacco: Never Used  . Alcohol use No  . Drug use: No  . Sexual activity: No   Other Topics Concern  . Not on file   Social History Narrative  . No narrative on file     Allergies  Allergen Reactions  . Sulfonamide Derivatives Rash    Family History  Problem Relation Age of Onset  . Hypertension Sister   . Glaucoma Sister       Prior to Admission medications   Medication Sig Start Date End  Date Taking? Authorizing Provider  amLODipine (NORVASC) 5 MG tablet Take 5 mg by mouth daily.   Yes Historical Provider, MD  aspirin EC 81 MG tablet Take 81 mg by mouth daily.   Yes Historical Provider, MD  brinzolamide (AZOPT) 1 % ophthalmic suspension Place 1 drop into the right eye 2 (two) times daily.   Yes Historical Provider, MD  cetirizine (ZYRTEC) 10 MG tablet Take 10 mg by mouth daily. 05/26/16  Yes Historical Provider, MD  cholecalciferol (VITAMIN D) 1000 units tablet Take 1,000 Units by mouth daily.   Yes Historical Provider, MD  Fluticasone-Salmeterol (ADVAIR) 250-50 MCG/DOSE AEPB Inhale 1 puff into the lungs every 12 (twelve) hours.   Yes Historical Provider, MD  gabapentin (NEURONTIN) 100 MG capsule Take 100 mg by mouth daily as needed. For back pain. 05/19/16  Yes Historical Provider, MD  LUMIGAN 0.01 % SOLN Place 1 drop into both eyes at bedtime. 05/27/16  Yes Historical Provider, MD  montelukast (SINGULAIR) 10 MG tablet Take 10 mg by mouth daily.    Yes Historical Provider, MD  oxybutynin (DITROPAN) 5 MG tablet Take 5 mg by mouth 2 (two) times daily. 06/14/16  Yes Historical Provider, MD  prochlorperazine (COMPAZINE) 10 MG tablet Take 10 mg by mouth 2 (two) times daily as needed. For nausea/vomiting. 04/11/16   Historical Provider, MD    Physical Exam:    Vitals:   06/28/16 1300 06/28/16 1816 06/28/16 2123 06/29/16 0300  BP: (!) 120/54  101/62 104/68  Pulse: (!) 102  99 92  Resp: 16  16 16   Temp: 99.5 F (37.5 C) 99 F (37.2 C) 98.2 F (36.8 C) 98 F (36.7 C)  TempSrc: Oral Oral Axillary Oral  SpO2: 98%  100% 100%  Weight:           Constitutional: NAD, calm, comfortable , sitting in chair Vitals:   06/28/16 1300 06/28/16 1816 06/28/16 2123 06/29/16 0300  BP: (!) 120/54  101/62 104/68  Pulse: (!) 102  99 92  Resp: 16  16 16   Temp: 99.5 F (37.5 C) 99 F (37.2 C) 98.2 F (36.8 C) 98 F (36.7 C)  TempSrc: Oral Oral Axillary Oral  SpO2: 98%  100% 100%  Weight:        Eyes: PERRL, lids and conjunctivae normal ENMT: Mucous membranes are moist. Posterior pharynx clear of any exudate  or lesions.Normal dentition.  Neck: normal, supple, no masses, no thyromegaly Respiratory: clear to auscultation bilaterally, no wheezing, no crackles. Normal respiratory effort. No accessory muscle use.  Cardiovascular: Regular rate and rhythm, no murmurs / rubs / gallops. No extremity edema. 2+ pedal pulses. No carotid bruits.  Abdomen: no tenderness, no masses palpated. No hepatosplenomegaly. Bowel sounds positive. Ostomy RLQ Musculoskeletal: no clubbing / cyanosis. No joint deformity upper and lower extremities. Good ROM, no contractures. Normal muscle tone.  Skin: no rashes, lesions, ulcers. RLE wrapped . Neurologic: CN 2-12 grossly intact. Sensation intact, DTR normal. Strength 5/5 in all 4.  Psychiatric: Normal judgment and insight. Alert and oriented x 3. Normal mood.     Labs on Admission: I have personally reviewed following labs and imaging studies  CBC:  Recent Labs Lab 06/23/16 1202 06/28/16 0920 06/29/16 0716  WBC 8.7 16.0* 21.9*  HGB 14.8 10.6* 10.0*  HCT 43.3 31.8* 30.0*  MCV 92.3 93.3 94.0  PLT 221 167 810    Basic Metabolic Panel:  Recent Labs Lab 06/23/16 1202 06/28/16 0920 06/29/16 0716  NA 139 138 134*  K 3.1* 3.6 4.4  CL 108 108 106  CO2 23 24 22   GLUCOSE 85 103* 96  BUN <5* 10 16  CREATININE 1.14* 1.48* 1.65*  CALCIUM 8.9 7.2* 7.6*    GFR: Estimated Creatinine Clearance: 30.2 mL/min (by C-G formula based on SCr of 1.65 mg/dL (H)).  Liver Function Tests:  Recent Labs Lab 06/23/16 1202  AST 37  ALT 19  ALKPHOS 118  BILITOT 0.9  PROT 8.0  ALBUMIN 3.2*   No results for input(s): LIPASE, AMYLASE in the last 168 hours. No results for input(s): AMMONIA in the last 168 hours.  Coagulation Profile:  Recent Labs Lab 06/27/16 0617  INR 1.17    Cardiac Enzymes: No results for input(s): CKTOTAL, CKMB, CKMBINDEX,  TROPONINI in the last 168 hours.  BNP (last 3 results) No results for input(s): PROBNP in the last 8760 hours.  HbA1C: No results for input(s): HGBA1C in the last 72 hours.  CBG: No results for input(s): GLUCAP in the last 168 hours.  Lipid Profile: No results for input(s): CHOL, HDL, LDLCALC, TRIG, CHOLHDL, LDLDIRECT in the last 72 hours.  Thyroid Function Tests: No results for input(s): TSH, T4TOTAL, FREET4, T3FREE, THYROIDAB in the last 72 hours.  Anemia Panel: No results for input(s): VITAMINB12, FOLATE, FERRITIN, TIBC, IRON, RETICCTPCT in the last 72 hours.  Urine analysis:    Component Value Date/Time   COLORURINE YELLOW 06/23/2016 1203   APPEARANCEUR HAZY (A) 06/23/2016 1203   LABSPEC 1.014 06/23/2016 1203   PHURINE 6.5 06/23/2016 1203   GLUCOSEU NEGATIVE 06/23/2016 1203   HGBUR NEGATIVE 06/23/2016 1203   HGBUR negative 02/18/2010 0856   BILIRUBINUR NEGATIVE 06/23/2016 [place] 06/23/2016 1203   PROTEINUR NEGATIVE 06/23/2016 1203   UROBILINOGEN 1.0 08/30/2013 0901   NITRITE NEGATIVE 06/23/2016 1203   LEUKOCYTESUR MODERATE (A) 06/23/2016 1203    Sepsis Labs: @LABRCNTIP (procalcitonin:4,lacticidven:4) )No results found for this or any previous visit (from the past 240 hour(s)).   Radiological Exams on Admission: Dg Chest Port 1 View  Result Date: 06/29/2016 CLINICAL DATA:  Preoperative exam for knee replacement. History of asthma and hypertension. No present complaints. EXAM: PORTABLE CHEST 1 VIEW COMPARISON:  None. FINDINGS: The heart size and mediastinal contours are within normal limits. Both lungs are clear. Mild chronic pleural blunting in the left lateral costophrenic angle. The visualized skeletal structures are unremarkable. IMPRESSION:  No active disease. Electronically Signed   By: Nelson Chimes M.D.   On: 06/29/2016 13:35    EKG: Independently reviewed.  Assessment/Plan Active Problems:   Hypocalcemia   Acute renal failure (HCC)    Leukocytosis   Anemia   Osteoarthritis of left knee   Primary localized osteoarthritis of right knee  Acute on Chronic kidney disease stage  baseline creatinine 1.14   Current Cr 1.65, in the setting of recent NSAIDs, possible intraoperative hypoperfusion. She is receiving NS at 100 cc/h without significant improvement of her Creatinine  Lab Results  Component Value Date   CREATININE 1.65 (H) 06/29/2016   CREATININE 1.48 (H) 06/28/2016   CREATININE 1.14 (H) 06/23/2016   Gentle IVF Hold NSAIDs including ASA Hold BP meds today , to allow permissive hypertension. May use hydralazine for BP 160/90 Repeat CMET in am If lab values do not improve, will consider renal ultrasound, may need Renal consultation   Leukocytosis, reactive versus related to underlying infection. Patient recently treated for pharyngitis with Amoxicillin x 5 days, recent steroids, recent surgery. No dysuria. Tmax 100.5 on 10/17  WBC 21.9  CXR  Pending  Urine culture CXR Procalcitonin Blood cultures when next fever spikes.  Gentle IVF  Repeat CBC in AM Would hold off antibiotics for now, as can worsen the renal function. Will initiate if fever spikes and after cultures were drawn   Hypocalcemia, ? Dilutional.  Initial Ca 8.8, now 7.6, possible dilutional.  Will provide 1 dose Ca gluconate Ionized Ca in am   Anemia  in a patient with remote colectomy 1977, on OP iron supplement, in the setting of operative blood loss, and possible infection, NSAIDs use ?Acute renal insufficiency and dilution.  Hemoglobin  on admission 14.8 which appears to be at baseline  to 10   Repeat CBC in am Hemoccult  Continue Iron supplements Consider to Transfuse 1 unit packed red blood cells if Hb less than 8 or acutely bleeding  Other medical issues as per admitting team. Thank you for the consultation   [facility] E, PA-C [facility]   If 7PM-7AM, please contact night-coverage [email or web address] Password [email or web address]  06/29/2016,  2:05 PM

## 2016-06-30 DIAGNOSIS — N390 Urinary tract infection, site not specified: Secondary | ICD-10-CM

## 2016-06-30 LAB — BASIC METABOLIC PANEL
Anion gap: 5 (ref 5–15)
BUN: 18 mg/dL (ref 6–20)
CO2: 19 mmol/L — ABNORMAL LOW (ref 22–32)
Calcium: 7.5 mg/dL — ABNORMAL LOW (ref 8.9–10.3)
Chloride: 109 mmol/L (ref 101–111)
Creatinine, Ser: 1.52 mg/dL — ABNORMAL HIGH (ref 0.44–1.00)
GFR calc Af Amer: 38 mL/min — ABNORMAL LOW (ref >60)
GFR calc non Af Amer: 32 mL/min — ABNORMAL LOW (ref >60)
Glucose, Bld: 84 mg/dL (ref 65–99)
Potassium: 4 mmol/L (ref 3.5–5.1)
Sodium: 133 mmol/L — ABNORMAL LOW (ref 135–145)

## 2016-06-30 LAB — URINE MICROSCOPIC-ADD ON

## 2016-06-30 LAB — URINALYSIS, ROUTINE W REFLEX MICROSCOPIC
Glucose, UA: NEGATIVE mg/dL
Hgb urine dipstick: NEGATIVE
Ketones, ur: NEGATIVE mg/dL
Nitrite: POSITIVE — AB
Protein, ur: NEGATIVE mg/dL
Specific Gravity, Urine: 1.018 (ref 1.005–1.030)
pH: 5.5 (ref 5.0–8.0)

## 2016-06-30 LAB — CBC
HCT: 26 % — ABNORMAL LOW (ref 36.0–46.0)
Hemoglobin: 8.8 g/dL — ABNORMAL LOW (ref 12.0–15.0)
MCH: 31.4 pg (ref 26.0–34.0)
MCHC: 33.8 g/dL (ref 30.0–36.0)
MCV: 92.9 fL (ref 78.0–100.0)
Platelets: 142 10*3/uL — ABNORMAL LOW (ref 150–400)
RBC: 2.8 MIL/uL — ABNORMAL LOW (ref 3.87–5.11)
RDW: 14.1 % (ref 11.5–15.5)
WBC: 16.9 10*3/uL — ABNORMAL HIGH (ref 4.0–10.5)

## 2016-06-30 LAB — ALBUMIN: Albumin: 2.1 g/dL — ABNORMAL LOW (ref 3.5–5.0)

## 2016-06-30 MED ORDER — CEPHALEXIN 500 MG PO CAPS
500.0000 mg | ORAL_CAPSULE | Freq: Two times a day (BID) | ORAL | Status: DC
  Administered 2016-06-30 – 2016-07-01 (×3): 500 mg via ORAL
  Filled 2016-06-30 (×3): qty 1

## 2016-06-30 NOTE — Care Management Note (Signed)
Case Management Note  Patient Details  Name: Samai Corea MRN: [id number] Date of Birth: [birth year]  Subjective/Objective:           CM following for progression and d/c planning.          Action/Plan: 06/30/2016 Met with pt who has used [facility] in the past and wishes to use that agency again for [facility] services. [place] notified of need for HHPT at time of d/c. Pt states that she has a walker and 3:1 commode. Pt has not used the CPM in the hospital will leave a note to the MD inquiring about possible need for CPM.   Expected Discharge Date:     07/01/2016             Expected Discharge Plan:  [place]  In-House Referral:  NA  Discharge planning Services  CM Consult  Post Acute Care Choice:  Home Health Choice offered to:  Patient  DME Arranged:    DME Agency:     HH Arranged:  PT [place]:  [place]  Status of Service:  In process, will continue to follow  If discussed at Long Length of Stay Meetings, dates discussed:    Additional Comments:  Adron Bene, RN 06/30/2016, 3:53 PM

## 2016-06-30 NOTE — Progress Notes (Signed)
Occupational Therapy Treatment Patient Details Name: Jackie Parker MRN: [id number] DOB: [birth year] Today's Date: 06/30/2016    History of present illness Pt is a 75 y.o. female now s/p Rt TKA. PMH: Lt TKA, vertigo, HTN, asthma,  back surgery.    OT comments  Pt with good progress with OT requiring close supervision for functional mobility and min assist for LB ADL this date. Pt and daughter educated on knee precautions during ADL, safe shower transfer, safe toilet transfer, and safe use of DME. Discussed potential benefits of AE for LB ADL but pt reports that she would prefer to have her daughter help her as able. D/C plan remains appropriate. OT will continue to follow while admitted.   Follow Up Recommendations  Supervision/Assistance - 24 hour;No OT follow up    Equipment Recommendations  3 in 1 bedside comode       Precautions / Restrictions Precautions Precautions: Knee;Fall Precaution Booklet Issued: No Precaution Comments: Reviewed knee precautions during ADL. Required Braces or Orthoses: Knee Immobilizer - Right Knee Immobilizer - Right: Discontinue once straight leg raise with < 10 degree lag Restrictions Weight Bearing Restrictions: Yes RLE Weight Bearing: Weight bearing as tolerated       Mobility Bed Mobility               General bed mobility comments: OOB in chair on OT arrival.  Transfers Overall transfer level: Needs assistance Equipment used: Rolling walker (2 wheeled) Transfers: Sit to/from Stand Sit to Stand: Supervision Stand pivot transfers: Min guard       General transfer comment: pt required increased time and min guard for safety. pt demonstrated good hand placement.     Balance Overall balance assessment: Needs assistance Sitting-balance support: Feet supported;No upper extremity supported Sitting balance-Leahy Scale: Fair     Standing balance support: During functional activity;Single extremity supported Standing balance-Leahy  Scale: Poor Standing balance comment: Able to remove single UE from RW for functional activity.                   ADL Overall ADL's : Needs assistance/impaired     Grooming: Wash/dry hands;Supervision/safety;Standing           Upper Body Dressing : Set up;Sitting   Lower Body Dressing: Minimal assistance;Sit to/from stand Lower Body Dressing Details (indicate cue type and reason): Pt able to simulate threading feet into pants but unable to don/doff socks. Toilet Transfer: Supervision/safety;BSC;RW;Ambulation       Tub/ Banker: Walk-in shower;Min guard;Ambulation;3 in 1;Rolling walker   Functional mobility during ADLs: Supervision/safety;Rolling walker General ADL Comments: Pt educated on knee precautions during ADL, use of zero degree bone foam and dressing techniques.                Cognition   Behavior During Therapy: WFL for tasks assessed/performed Overall Cognitive Status: Within Functional Limits for tasks assessed                         Exercises Total Joint Exercises Quad Sets: Strengthening;Right;10 reps;Seated Straight Leg Raises: AROM;AAROM;Right;10 reps;Seated Long Arc Quad: AAROM;Right;10 reps;Seated Knee Flexion: AAROM;Right;10 reps;Seated           Pertinent Vitals/ Pain       Pain Assessment: 0-10 Pain Score: 5  Pain Location: R knee Pain Descriptors / Indicators: Grimacing;Sore;Guarding Pain Intervention(s): Monitored during session;Repositioned         Frequency  Min 2X/week        Progress Toward Goals  OT Goals(current goals can now be found in the care plan section)  Progress towards OT goals: Progressing toward goals  Acute Rehab OT Goals Patient Stated Goal: decrease pain OT Goal Formulation: With patient Time For Goal Achievement: 07/12/16 Potential to Achieve Goals: Good ADL Goals Pt Will Perform Lower Body Bathing: with supervision;sit to/from stand Pt Will Perform Lower Body Dressing: with  supervision;sit to/from stand Pt Will Transfer to Toilet: with supervision;ambulating;bedside commode Pt Will Perform Tub/Shower Transfer: Tub transfer;shower seat;3 in 1;rolling walker;ambulating  Plan Discharge plan remains appropriate       End of Session Equipment Utilized During Treatment: Gait belt;Rolling walker   Activity Tolerance Patient tolerated treatment well   Patient Left in chair;with call bell/phone within reach;with family/visitor present           Time: 6191-5502 OT Time Calculation (min): 18 min  Charges: OT General Charges $OT Visit: 1 Procedure OT Treatments $Self Care/Home Management : 8-22 mins  Norman Herrlich, OTR/L [phone] 06/30/2016, 3:26 PM

## 2016-06-30 NOTE — Progress Notes (Signed)
Physical Therapy Treatment Patient Details Name: Jackie Parker MRN: [id number] DOB: 05/08/1941 Today's Date: 06/30/2016    History of Present Illness Pt is a 75 y.o. female now s/p Rt TKA. PMH: Lt TKA, vertigo, HTN, asthma,  back surgery.     PT Comments    Pt presented sitting OOB in recliner when PT entered room. Pt making steady progress towards achieving her functional goals. PT planning to perform stair training at next session if appropriate. Pt would continue to benefit from skilled physical therapy services at this time while admitted and after d/c to address her limitations in order to improve her overall safety and independence with functional mobility.   Follow Up Recommendations  Home health PT;Supervision for mobility/OOB     Equipment Recommendations  Other (comment) (pt checking with family regarding RW)    Recommendations for Other Services       Precautions / Restrictions Precautions Precautions: Knee;Fall Required Braces or Orthoses: Knee Immobilizer - Right Knee Immobilizer - Right: Discontinue once straight leg raise with < 10 degree lag Restrictions Weight Bearing Restrictions: Yes RLE Weight Bearing: Weight bearing as tolerated    Mobility  Bed Mobility               General bed mobility comments: pt sitting OOB in recliner when PT entered room  Transfers Overall transfer level: Needs assistance Equipment used: Rolling walker (2 wheeled) Transfers: Sit to/from [facility] Sit to Stand: Min guard Stand pivot transfers: Min guard       General transfer comment: pt required increased time and min guard for safety. pt demonstrated good hand placement.   Ambulation/Gait Ambulation/Gait assistance: Min guard Ambulation Distance (Feet): 75 Feet Assistive device: Rolling walker (2 wheeled) Gait Pattern/deviations: Step-to pattern;Decreased step length - left;Decreased stance time - right;Decreased weight shift to right;Trunk  flexed Gait velocity: decreased Gait velocity interpretation: Below normal speed for age/gender General Gait Details: pt with improved posture and forward gaze during ambulation this session as compared to previous   Stairs            Wheelchair Mobility    Modified Rankin (Stroke Patients Only)       Balance Overall balance assessment: Needs assistance Sitting-balance support: Feet supported;No upper extremity supported Sitting balance-Leahy Scale: Fair     Standing balance support: During functional activity;Bilateral upper extremity supported Standing balance-Leahy Scale: Poor Standing balance comment: pt reliant on bilateral UEs on RW                    Cognition Arousal/Alertness: Awake/alert Behavior During Therapy: WFL for tasks assessed/performed Overall Cognitive Status: Within Functional Limits for tasks assessed                      Exercises Total Joint Exercises Quad Sets: Strengthening;Right;10 reps;Seated Straight Leg Raises: AROM;AAROM;Right;10 reps;Seated Long Arc Quad: AAROM;Right;10 reps;Seated Knee Flexion: AAROM;Right;10 reps;Seated    General Comments        Pertinent Vitals/Pain Pain Assessment: No/denies pain Pain Intervention(s): Monitored during session    Home Living                      Prior Function            PT Goals (current goals can now be found in the care plan section) Acute Rehab PT Goals Patient Stated Goal: decrease pain PT Goal Formulation: With patient Time For Goal Achievement: 07/11/16 Potential to Achieve Goals: Good Progress towards  PT goals: Progressing toward goals    Frequency    7X/week      PT Plan Current plan remains appropriate    Co-evaluation             End of Session Equipment Utilized During Treatment: Gait belt Activity Tolerance: Patient limited by fatigue Patient left: in chair;with call bell/phone within reach     Time: 1255-1316 PT Time  Calculation (min) (ACUTE ONLY): 21 min  Charges:  $Gait Training: 8-22 mins                    G CodesClearnce Sorrel Elzabeth Mcquerry 2016-07-13, 1:32 PM Sherie Don, [place], DPT [phone]

## 2016-06-30 NOTE — Progress Notes (Signed)
PROGRESS NOTE [facility]   Uldine Fuster   FTD:[id number] DOB: 1941-02-21  DOA: 06/27/2016 PCP: Philis Fendt, MD   Brief Narrative:  Jackie Parker is a 75 y.o. female  who was admitted for R TKR on 10/16, having initially tolerated the procedure well without complications. However, since admission, it was noted that her lab showed abnormalities for which [facility] was asked to see in consultation : Pertinent data:  Ca 8.9->7.6 Cr 1.5-> 1.65 WBC 16->21.9 (she was taking until prior to admission prednisone as recommended by her Ortho MD ) Hb dropped from 14.8->10  Denies fevers today, but yesterday her Tmax was 100.5; denies chills, night sweats, vision changes, or mucositis. Denies any respiratory complaints.She was recovering from strep throat prior to admission, treated with amoxicillin. At the time of admission, she had no dysphagia or neck swelling.  Denies any chest pain or palpitations. Denies lower extremity swelling on the left. The right is post operatively swollen, but she denies any worsening tenderness. She has been ambulating with assistance without any issues. Denies nausea, heartburn or change in bowel habits. Denies abdominal pain.Of note, patient has a remote total colectomy with  colostomy since 1977 for ulcerative colitis -the patient does not wish any of her family to know about this, especially her daughter who is a Network engineer here at [employer]-. Appetite is normal. Denies any dysuria and urinating normally. Denies abnormal skin rashes, or neuropathy. Denies any bleeding issues such as epistaxis, hematemesis, hematuria or hematochezia. She has been increasingly thirsty over the last few days.   Subjective:  Patient seen and examined at bedside. Patient report pain well controlled no other complaints.   Assessment & Plan:  Acute on Chronic kidney disease stage III  baseline creatinine 1.14 on admission, Current Cr 1.52, in the setting of recent NSAIDs,  possible intraoperative hypoperfusion. Chronic component as well.  Encourage PO intake  Hold NSAIDs including ASA Repeat BMP in am  Doubt that will improve quickly given chronic component can be monitor as outpatient.   Leukocytosis - decreasing, 2/2 to UTI, plus reactive, recent steroids use and recent surgery. Denies dysuria and urinary frequency Keflex 500 mg every 12 hours Urine cultures pending  UTI - see above   Hypocalcemia - Corrected Ca 9.0  No further treatment needed   Anemia  in a patient with remote colectomy 1977, on OP iron supplement, in the setting of operative blood loss, and possible infection, NSAIDs use ?Acute renal insufficiency and dilution.  Continue Iron supplements Consider to Transfuse 1 unit packed red blood cells if Hb less than 7 or acutely bleeding  Other problems as per primary team . Thank you for the consultation    DVT prophylaxis: SCDs Code Status: Full Family Communication: None at bedside Disposition Plan: Per primary team medically stable, medical issues can be follow as outpatient   Antimicrobials:  Keflex    Objective: Vitals:   06/29/16 1958 06/29/16 2016 06/30/16 0401 06/30/16 1045  BP: (!) 85/48 (!) 102/59 (!) 120/55 (!) 117/55  Pulse: (!) 109 99 91   Resp: 17  18   Temp: 99 F (37.2 C)  98.4 F (36.9 C)   TempSrc: Oral  Oral   SpO2: 96%  99%   Weight:        Intake/Output Summary (Last 24 hours) at 06/30/16 1142 Last data filed at 06/30/16 0404  Gross per 24 hour  Intake              240 ml  Output              400 ml  Net             -160 ml   Filed Weights   06/27/16 0642  Weight: 87.1 kg (192 lb)    Examination:  General exam: Appears calm and comfortable  HEENT: AC/AT, PERRLA, OP moist and clear Respiratory system: Clear to auscultation. No wheezes,crackle or rhonchi Cardiovascular system: S1 & S2 heard, RRR. No JVD, murmurs, rubs or gallops Gastrointestinal system: no tenderness, no masses palpated.  Bowel sounds positive. Ostomy RLQ Central nervous system: Alert and oriented. No focal neurological deficits. Extremities: No pedal edema. Symmetric, strength 5/5   Skin: No rashes, lesions or ulcers Psychiatry: Judgement and insight appear normal. Mood & affect appropriate.    Data Reviewed: I have personally reviewed following labs and imaging studies  CBC:  Recent Labs Lab 06/23/16 1202 06/28/16 0920 06/29/16 0716 06/30/16 0334  WBC 8.7 16.0* 21.9* 16.9*  HGB 14.8 10.6* 10.0* 8.8*  HCT 43.3 31.8* 30.0* 26.0*  MCV 92.3 93.3 94.0 92.9  PLT 221 167 159 563*   Basic Metabolic Panel:  Recent Labs Lab 06/23/16 1202 06/28/16 0920 06/29/16 0716 06/30/16 0800  NA 139 138 134* 133*  K 3.1* 3.6 4.4 4.0  CL 108 108 106 109  CO2 23 24 22  19*  GLUCOSE 85 103* 96 84  BUN <5* 10 16 18   CREATININE 1.14* 1.48* 1.65* 1.52*  CALCIUM 8.9 7.2* 7.6* 7.5*   GFR: Estimated Creatinine Clearance: 32.8 mL/min (by C-G formula based on SCr of 1.52 mg/dL (H)). Liver Function Tests:  Recent Labs Lab 06/23/16 1202 06/30/16 0800  AST 37  --   ALT 19  --   ALKPHOS 118  --   BILITOT 0.9  --   PROT 8.0  --   ALBUMIN 3.2* 2.1*   No results for input(s): LIPASE, AMYLASE in the last 168 hours. No results for input(s): AMMONIA in the last 168 hours. Coagulation Profile:  Recent Labs Lab 06/27/16 0617  INR 1.17   Cardiac Enzymes: No results for input(s): CKTOTAL, CKMB, CKMBINDEX, TROPONINI in the last 168 hours. BNP (last 3 results) No results for input(s): PROBNP in the last 8760 hours. HbA1C: No results for input(s): HGBA1C in the last 72 hours. CBG: No results for input(s): GLUCAP in the last 168 hours. Lipid Profile: No results for input(s): CHOL, HDL, LDLCALC, TRIG, CHOLHDL, LDLDIRECT in the last 72 hours. Thyroid Function Tests: No results for input(s): TSH, T4TOTAL, FREET4, T3FREE, THYROIDAB in the last 72 hours. Anemia Panel: No results for input(s): VITAMINB12, FOLATE,  FERRITIN, TIBC, IRON, RETICCTPCT in the last 72 hours. Sepsis Labs:  Recent Labs Lab 06/29/16 1407  PROCALCITON 0.11    No results found for this or any previous visit (from the past 240 hour(s)).      Radiology Studies: Dg Chest Port 1 View  Result Date: 06/29/2016 CLINICAL DATA:  Preoperative exam for knee replacement. History of asthma and hypertension. No present complaints. EXAM: PORTABLE CHEST 1 VIEW COMPARISON:  None. FINDINGS: The heart size and mediastinal contours are within normal limits. Both lungs are clear. Mild chronic pleural blunting in the left lateral costophrenic angle. The visualized skeletal structures are unremarkable. IMPRESSION: No active disease. Electronically Signed   By: Nelson Chimes M.D.   On: 06/29/2016 13:35      Scheduled Meds: . amLODipine  5 mg Oral Daily  . brinzolamide  1 drop Right Eye BID  .  cholecalciferol  1,000 Units Oral Daily  . docusate sodium  100 mg Oral BID  . ferrous sulfate  325 mg Oral TID PC  . latanoprost  1 drop Both Eyes QHS  . loratadine  10 mg Oral Daily  . mometasone-formoterol  2 puff Inhalation BID  . montelukast  10 mg Oral Daily  . oxybutynin  5 mg Oral BID  . pneumococcal 23 valent vaccine  0.5 mL Intramuscular Tomorrow-1000   Continuous Infusions: . sodium chloride 100 mL/hr at 06/29/16 1007     LOS: 3 days    Chipper Oman, MD [facility] Pager [phone]  If 7PM-7AM, please contact night-coverage [email or web address] Password [email or web address] 06/30/2016, 11:42 AM

## 2016-06-30 NOTE — Progress Notes (Addendum)
     Subjective: 3 Days Post-Op Procedure(s) (LRB): RIGHT TOTAL KNEE ARTHROPLASTY (Right) Awake, alert and oriented x 4. IM consult, suspect renal changes secondary to NSAIDs recommend d/c aspirin, no more toradol. hgb 8.8 , BMET pending from this AM.  Patient reports pain as moderate.    Objective:   VITALS:  Temp:  [98.4 F (36.9 C)-99 F (37.2 C)] 98.4 F (36.9 C) (10/19 0401) Pulse Rate:  [91-109] 91 (10/19 0401) Resp:  [17-18] 18 (10/19 0401) BP: (85-120)/(48-62) 120/55 (10/19 0401) SpO2:  [95 %-99 %] 99 % (10/19 0401)  Neurologically intact ABD soft Neurovascular intact Sensation intact distally Intact pulses distally Dorsiflexion/Plantar flexion intact Incision: no drainage   LABS  Recent Labs  06/28/16 0920 06/29/16 0716 06/30/16 0334  HGB 10.6* 10.0* 8.8*  WBC 16.0* 21.9* 16.9*  PLT 167 159 142*    Recent Labs  06/28/16 0920 06/29/16 0716  NA 138 134*  K 3.6 4.4  CL 108 106  CO2 24 22  BUN 10 16  CREATININE 1.48* 1.65*  GLUCOSE 103* 96   No results for input(s): LABPT, INR in the last 72 hours.   Assessment/Plan: 3 Days Post-Op Procedure(s) (LRB): RIGHT TOTAL KNEE ARTHROPLASTY (Right)  Elevated creatinine,  Anemia of blood loss perioperative.   Advance diet Up with therapy  Continue IVF and check BMET from today. procalcitonin negative for concerns of sepsis  =0.11 Have IM eval and watch renal function. Hold discharge till sure renal  Situation is stable. Discontinue aspirin and start xarelto 10 mg q day.   Jessy Oto 06/30/2016, 8:09 AM

## 2016-06-30 NOTE — Progress Notes (Signed)
Orthopedic Tech Progress Note Patient Details:  Jackie Parker 1940-09-27 [id number]  Patient ID: [name], female   DOB: 10/15/1940, 75 y.o.   MRN: [id number] Applied cpm 0-55  [name] 06/30/2016, 6:25 AM

## 2016-06-30 NOTE — Progress Notes (Signed)
PT Cancellation Note  Patient Details Name: Jackie Parker MRN: [id number] DOB: Mar 23, 1941   Cancelled Treatment:    Reason Eval/Treat Not Completed: Patient declined, no reason specified. Pt refusing to participate in therapy session this PM, stating that she is "too tired". PT will continue to f/u with pt as appropriate.    [place] 06/30/2016, 4:08 PM Sherie Don, [place], DPT [phone]

## 2016-07-01 ENCOUNTER — Ambulatory Visit (INDEPENDENT_AMBULATORY_CARE_PROVIDER_SITE_OTHER): Payer: Medicare Other | Admitting: Specialist

## 2016-07-01 DIAGNOSIS — D649 Anemia, unspecified: Secondary | ICD-10-CM

## 2016-07-01 LAB — CALCIUM, IONIZED: Calcium, Ionized, Serum: 4.5 mg/dL (ref 4.5–5.6)

## 2016-07-01 LAB — BASIC METABOLIC PANEL
Anion gap: 5 (ref 5–15)
BUN: 17 mg/dL (ref 6–20)
CO2: 21 mmol/L — ABNORMAL LOW (ref 22–32)
Calcium: 7.7 mg/dL — ABNORMAL LOW (ref 8.9–10.3)
Chloride: 109 mmol/L (ref 101–111)
Creatinine, Ser: 1.36 mg/dL — ABNORMAL HIGH (ref 0.44–1.00)
GFR calc Af Amer: 43 mL/min — ABNORMAL LOW (ref >60)
GFR calc non Af Amer: 37 mL/min — ABNORMAL LOW (ref >60)
Glucose, Bld: 100 mg/dL — ABNORMAL HIGH (ref 65–99)
Potassium: 3.6 mmol/L (ref 3.5–5.1)
Sodium: 135 mmol/L (ref 135–145)

## 2016-07-01 LAB — CBC WITH DIFFERENTIAL/PLATELET
Basophils Absolute: 0 K/uL (ref 0.0–0.1)
Basophils Relative: 0 %
Eosinophils Absolute: 0.3 K/uL (ref 0.0–0.7)
Eosinophils Relative: 2 %
HCT: 24.8 % — ABNORMAL LOW (ref 36.0–46.0)
Hemoglobin: 8.4 g/dL — ABNORMAL LOW (ref 12.0–15.0)
Lymphocytes Relative: 21 %
Lymphs Abs: 2.6 K/uL (ref 0.7–4.0)
MCH: 31.2 pg (ref 26.0–34.0)
MCHC: 33.9 g/dL (ref 30.0–36.0)
MCV: 92.2 fL (ref 78.0–100.0)
Monocytes Absolute: 1.3 K/uL — ABNORMAL HIGH (ref 0.1–1.0)
Monocytes Relative: 11 %
Neutro Abs: 7.9 K/uL — ABNORMAL HIGH (ref 1.7–7.7)
Neutrophils Relative %: 66 %
Platelets: 173 K/uL (ref 150–400)
RBC: 2.69 MIL/uL — ABNORMAL LOW (ref 3.87–5.11)
RDW: 14.1 % (ref 11.5–15.5)
WBC: 12.1 K/uL — ABNORMAL HIGH (ref 4.0–10.5)

## 2016-07-01 LAB — URINE CULTURE

## 2016-07-01 LAB — C-REACTIVE PROTEIN: CRP: 12.9 mg/dL — ABNORMAL HIGH (ref <1.0)

## 2016-07-01 LAB — PROCALCITONIN: Procalcitonin: 2.28 ng/mL

## 2016-07-01 LAB — SEDIMENTATION RATE: Sed Rate: 82 mm/h — ABNORMAL HIGH (ref 0–22)

## 2016-07-01 MED ORDER — OXYCODONE-ACETAMINOPHEN 5-325 MG PO TABS: 1.0000 | ORAL_TABLET | Freq: Four times a day (QID) | ORAL | 0 refills | Status: DC | PRN

## 2016-07-01 MED ORDER — RIVAROXABAN 10 MG PO TABS: 10.0000 mg | ORAL_TABLET | Freq: Every day | ORAL | 0 refills | Status: DC

## 2016-07-01 MED ORDER — METHOCARBAMOL 500 MG PO TABS: 500.0000 mg | ORAL_TABLET | Freq: Three times a day (TID) | ORAL | 0 refills | Status: DC | PRN

## 2016-07-01 MED ORDER — CEPHALEXIN 500 MG PO CAPS: 500.0000 mg | ORAL_CAPSULE | Freq: Two times a day (BID) | ORAL | 0 refills | Status: DC

## 2016-07-01 NOTE — Progress Notes (Signed)
Patient discharged to home, discharge instructions given, patient stated she understood

## 2016-07-01 NOTE — Progress Notes (Signed)
PROGRESS NOTE [facility]   Jackie Parker   BMW:[id number] DOB: [birth year]  DOA: 06/27/2016 PCP: Philis Fendt, MD   Brief Narrative:  Jackie Parker is a 75 y.o. female  who was admitted for R TKR on 10/16, having initially tolerated the procedure well without complications. However, since admission, it was noted that her lab showed abnormalities for which [facility]  Subjective:  Patient seen and examined at bedside, no acute events overnight. Patient have no complaints. Denies SOB, CP palpitations, N/V/D and dysuria.   Assessment & Plan: Acute on Chronic kidney disease stage III - Improving Cr 1.35  baseline creatinine 1.14 on admission, Current Cr 1.52, in the setting of recent NSAIDs, possible intraoperative hypoperfusion. Chronic component as well.  -Doubt that will improve quickly given chronic component can be monitor as outpatient.  -Encourage PO intake  -Hold NSAIDs including ASA  Leukocytosis with PCT elevation - no evidence of sepsis, both finding likely to be reactive from recent surgical procedure, there is no evidence of sepsis. ? UTI possible contaminant, cultures with multiple species. Patient asymptomatic no dysuria, suprapubic pain or urinary frequency.  -Can repeat UA and follow up as outpatient  -Can d/c on Keflex 500 mg every 12 hours for total of 3 days   Hypocalcemia - Corrected Ca 9.2 No further treatment needed   Anemia  in a patient with remote colectomy 1977, on OP iron supplement, in the setting of operative blood loss and dilution.  -Continue Iron supplements -Consider to Transfuse 1 unit packed red blood cells if Hb less than 7 or acutely bleeding  Other problems as per primary team . Thank you for the consultation    DVT prophylaxis: SCDs Code Status: Full Family Communication: None at bedside Disposition Plan: Per primary team medically stable. From medical stand point can be discharge with follow up with PMD    Antimicrobials:  Keflex   Objective: Vitals:   06/30/16 1045 06/30/16 1300 06/30/16 2002 07/01/16 0425  BP: (!) 117/55 (!) 100/44 (!) 107/54 110/69  Pulse:  94 96 94  Resp:  18 18 18   Temp:  98.9 F (37.2 C) 98.9 F (37.2 C) 97.1 F (36.2 C)  TempSrc:  Oral Oral Oral  SpO2:  100% 100% 100%  Weight:        Intake/Output Summary (Last 24 hours) at 07/01/16 1129 Last data filed at 06/30/16 1300  Gross per 24 hour  Intake              260 ml  Output                0 ml  Net              260 ml   Filed Weights   06/27/16 0642  Weight: 87.1 kg (192 lb)    Examination:  General exam: Appears calm and comfortable  Respiratory system: Clear to auscultation. No wheezes,crackle or rhonchi Cardiovascular system: S1 & S2 heard, RRR. No JVD, murmurs, rubs or gallops Gastrointestinal system: no tenderness, no masses palpated. Bowel sounds positive. Ostomy RLQ Central nervous system: Alert and oriented. No focal neurological deficits. Extremities: Trace pedal edema Symmetric, strength 5/5   Skin: No rashes, lesions or ulcers Psychiatry: Judgement and insight appear normal. Mood & affect appropriate.    Data Reviewed: I have personally reviewed following labs and imaging studies  CBC:  Recent Labs Lab 06/28/16 0920 06/29/16 0716 06/30/16 0334 07/01/16 0921  WBC 16.0* 21.9* 16.9* 12.1*  NEUTROABS  --   --   --  7.9*  HGB 10.6* 10.0* 8.8* 8.4*  HCT 31.8* 30.0* 26.0* 24.8*  MCV 93.3 94.0 92.9 92.2  PLT 167 159 142* 240   Basic Metabolic Panel:  Recent Labs Lab 06/28/16 0920 06/29/16 0716 06/30/16 0800 07/01/16 0921  NA 138 134* 133* 135  K 3.6 4.4 4.0 3.6  CL 108 106 109 109  CO2 24 22 19* 21*  GLUCOSE 103* 96 84 100*  BUN 10 16 18 17   CREATININE 1.48* 1.65* 1.52* 1.36*  CALCIUM 7.2* 7.6* 7.5* 7.7*   GFR: Estimated Creatinine Clearance: 36.6 mL/min (by C-G formula based on SCr of 1.36 mg/dL (H)). Liver Function Tests:  Recent Labs Lab 06/30/16 0800   ALBUMIN 2.1*   No results for input(s): LIPASE, AMYLASE in the last 168 hours. No results for input(s): AMMONIA in the last 168 hours. Coagulation Profile:  Recent Labs Lab 06/27/16 0617  INR 1.17   Cardiac Enzymes: No results for input(s): CKTOTAL, CKMB, CKMBINDEX, TROPONINI in the last 168 hours. BNP (last 3 results) No results for input(s): PROBNP in the last 8760 hours. HbA1C: No results for input(s): HGBA1C in the last 72 hours. CBG: No results for input(s): GLUCAP in the last 168 hours. Lipid Profile: No results for input(s): CHOL, HDL, LDLCALC, TRIG, CHOLHDL, LDLDIRECT in the last 72 hours. Thyroid Function Tests: No results for input(s): TSH, T4TOTAL, FREET4, T3FREE, THYROIDAB in the last 72 hours. Anemia Panel: No results for input(s): VITAMINB12, FOLATE, FERRITIN, TIBC, IRON, RETICCTPCT in the last 72 hours. Sepsis Labs:  Recent Labs Lab 06/29/16 1407 07/01/16 0557  PROCALCITON 0.11 2.28    Recent Results (from the past 240 hour(s))  Culture, Urine     Status: Abnormal   Collection Time: 06/30/16  1:05 AM  Result Value Ref Range Status   Specimen Description URINE, CLEAN CATCH  Final   Special Requests NONE  Final   Culture MULTIPLE SPECIES PRESENT, SUGGEST RECOLLECTION (A)  Final   Report Status 07/01/2016 FINAL  Final     Radiology Studies: Dg Chest Port 1 View  Result Date: 06/29/2016 CLINICAL DATA:  Preoperative exam for knee replacement. History of asthma and hypertension. No present complaints. EXAM: PORTABLE CHEST 1 VIEW COMPARISON:  None. FINDINGS: The heart size and mediastinal contours are within normal limits. Both lungs are clear. Mild chronic pleural blunting in the left lateral costophrenic angle. The visualized skeletal structures are unremarkable. IMPRESSION: No active disease. Electronically Signed   By: Nelson Chimes M.D.   On: 06/29/2016 13:35    Scheduled Meds: . amLODipine  5 mg Oral Daily  . brinzolamide  1 drop Right Eye BID  .  cephALEXin  500 mg Oral Q12H  . cholecalciferol  1,000 Units Oral Daily  . docusate sodium  100 mg Oral BID  . ferrous sulfate  325 mg Oral TID PC  . latanoprost  1 drop Both Eyes QHS  . loratadine  10 mg Oral Daily  . mometasone-formoterol  2 puff Inhalation BID  . montelukast  10 mg Oral Daily  . oxybutynin  5 mg Oral BID  . pneumococcal 23 valent vaccine  0.5 mL Intramuscular Tomorrow-1000   Continuous Infusions: . sodium chloride 100 mL/hr at 06/29/16 1007     LOS: 4 days    Chipper Oman, MD [facility] Pager [phone]  If 7PM-7AM, please contact night-coverage [email or web address] Password [email or web address] 07/01/2016, 11:29 AM

## 2016-07-01 NOTE — Progress Notes (Signed)
Physical Therapy Treatment Patient Details Name: Jackie Parker MRN: [id number] DOB: Feb 22, 1941 Today's Date: 07/01/2016    History of Present Illness Pt is a 75 y.o. female now s/p Rt TKA. PMH: Lt TKA, vertigo, HTN, asthma,  back surgery.     PT Comments    Pt presented supine in bed with HOB elevated, awake and willing to participate in therapy session. Pt expressed agitation regarding wanting to d/c home today and difficulty understanding why she was still in the hospital. PT encouraged pt to ask her nurse and doctors those questions when available. Pt successfully completed stair training during this session. Pt would continue to benefit from skilled physical therapy services at this time while admitted and after d/c to address her limitations in order to improve her overall safety and independence with functional mobility.   Follow Up Recommendations  Home health PT;Supervision for mobility/OOB     Equipment Recommendations  None recommended by PT    Recommendations for Other Services       Precautions / Restrictions Precautions Precautions: Knee;Fall Required Braces or Orthoses: Knee Immobilizer - Right Knee Immobilizer - Right: Discontinue once straight leg raise with < 10 degree lag Restrictions Weight Bearing Restrictions: Yes RLE Weight Bearing: Weight bearing as tolerated    Mobility  Bed Mobility Overal bed mobility: Needs Assistance Bed Mobility: Supine to Sit;Sit to Supine     Supine to sit: Min assist Sit to supine: Min assist   General bed mobility comments: Pt required increased time, use of bed rails and min A with movement of bilateral LEs  Transfers Overall transfer level: Needs assistance Equipment used: Rolling walker (2 wheeled) Transfers: Sit to/from Stand Sit to Stand: Supervision         General transfer comment: pt required increased time with good hand placement  Ambulation/Gait Ambulation/Gait assistance: Min guard Ambulation  Distance (Feet): 20 Feet (one bout of 20' and one bout of 10') Assistive device: Rolling walker (2 wheeled) Gait Pattern/deviations: Step-to pattern;Decreased step length - left;Decreased stance time - right;Decreased weight shift to right;Trunk flexed Gait velocity: decreased Gait velocity interpretation: Below normal speed for age/gender General Gait Details: pt with improved posture and forward gaze during ambulation this session as compared to previous   Stairs Stairs: Yes Stairs assistance: Min guard Stair Management: Two rails;Step to pattern;Forwards Number of Stairs: 2 General stair comments: pt ascended with L LE leading and descended with R LE leading  Wheelchair Mobility    Modified Rankin (Stroke Patients Only)       Balance Overall balance assessment: Needs assistance Sitting-balance support: Feet supported;No upper extremity supported Sitting balance-Leahy Scale: Fair     Standing balance support: During functional activity;Bilateral upper extremity supported Standing balance-Leahy Scale: Poor Standing balance comment: pt reliant on bilateral UEs on RW                    Cognition Arousal/Alertness: Awake/alert Behavior During Therapy: WFL for tasks assessed/performed;Agitated Overall Cognitive Status: Within Functional Limits for tasks assessed                      Exercises Total Joint Exercises Goniometric ROM: Flexion = 55 degrees; Extension = lacking 18 degrees; measured in sitting    General Comments        Pertinent Vitals/Pain Pain Assessment: Faces Faces Pain Scale: Hurts a little bit Pain Location: R knee Pain Descriptors / Indicators: Sore Pain Intervention(s): Monitored during session;Repositioned    Home Living  Prior Function            PT Goals (current goals can now be found in the care plan section) Acute Rehab PT Goals Patient Stated Goal: go home PT Goal Formulation: With  patient Time For Goal Achievement: 07/11/16 Potential to Achieve Goals: Good Progress towards PT goals: Progressing toward goals    Frequency    7X/week      PT Plan Current plan remains appropriate    Co-evaluation             End of Session Equipment Utilized During Treatment: Gait belt Activity Tolerance: Patient limited by fatigue Patient left: in bed;with call bell/phone within reach     Time: 1018-1040 PT Time Calculation (min) (ACUTE ONLY): 22 min  Charges:  $Gait Training: 8-22 mins                    G CodesClearnce Sorrel Bret Stamour 07-21-16, 10:55 AM Sherie Don, PT, DPT [phone]

## 2016-07-01 NOTE — Progress Notes (Signed)
     Subjective: 4 Days Post-Op Procedure(s) (LRB): RIGHT TOTAL KNEE ARTHROPLASTY (Right) Awake,alert and oriented x 4. No complaints. No fever or chills. Patient reports pain as mild.    Objective:   VITALS:  Temp:  [97.1 F (36.2 C)-98.9 F (37.2 C)] 97.1 F (36.2 C) (10/20 0425) Pulse Rate:  [94-96] 94 (10/20 0425) Resp:  [18] 18 (10/20 0425) BP: (100-117)/(44-69) 110/69 (10/20 0425) SpO2:  [100 %] 100 % (10/20 0425)  Neurologically intact ABD soft Neurovascular intact Sensation intact distally Intact pulses distally Dorsiflexion/Plantar flexion intact Incision: no drainage No cellulitis present   LABS  Recent Labs  06/28/16 0920 06/29/16 0716 06/30/16 0334  HGB 10.6* 10.0* 8.8*  WBC 16.0* 21.9* 16.9*  PLT 167 159 142*    Recent Labs  06/29/16 0716 06/30/16 0800  NA 134* 133*  K 4.4 4.0  CL 106 109  CO2 22 19*  BUN 16 18  CREATININE 1.65* 1.52*  GLUCOSE 96 84   No results for input(s): LABPT, INR in the last 72 hours.   Assessment/Plan: 4 Days Post-Op Procedure(s) (LRB): RIGHT TOTAL KNEE ARTHROPLASTY (Right)  Elevated Procalcitonin.  Advance diet Up with therapy Discharge home with home health later today if she is deemed stable by  Medicine. I will have Benjiman Core PA-C see this afternoon to help determine if she can be Discharged. Presently appears stable, lab however shows elevation of procalcitonin Level which is worrisome for sepsis. She does not appear septic. Will check CBC and BMET. No antibiotics.  Jessy Oto 07/01/2016, 8:26 AM

## 2016-07-01 NOTE — Care Management Important Message (Signed)
Important Message  Patient Details  Name: Jackie Parker MRN: [id number] Date of Birth: 27-Apr-1941   Medicare Important Message Given:  Yes    Shagun Wordell 07/01/2016, 4:10 PM

## 2016-07-04 ENCOUNTER — Telehealth (INDEPENDENT_AMBULATORY_CARE_PROVIDER_SITE_OTHER): Payer: Self-pay | Admitting: Radiology

## 2016-07-04 NOTE — Telephone Encounter (Signed)
Called spoke with Tillie Rung at [facility] gave verbal ok. Thanks. Nira Conn

## 2016-07-04 NOTE — Telephone Encounter (Signed)
Dr. Louanne Skye pt,  Tillie Rung from [facility] calling to get verbal orders for HHPT 1 x for 1 wk  3 x for 1 week, 2 x for 1 week.

## 2016-07-05 ENCOUNTER — Telehealth (INDEPENDENT_AMBULATORY_CARE_PROVIDER_SITE_OTHER): Payer: Self-pay | Admitting: Specialist

## 2016-07-05 NOTE — Telephone Encounter (Signed)
Patient called stating that she needs a 2 week po visit with Nitka following surgery. No availability.  Contact Info: [phone]

## 2016-07-07 NOTE — Telephone Encounter (Signed)
Appt scheduled for 9:15 on 07/13/16

## 2016-07-11 ENCOUNTER — Telehealth (INDEPENDENT_AMBULATORY_CARE_PROVIDER_SITE_OTHER): Payer: Self-pay | Admitting: Specialist

## 2016-07-11 MED ORDER — OXYCODONE-ACETAMINOPHEN 5-325 MG PO TABS: 1.0000 | ORAL_TABLET | Freq: Four times a day (QID) | ORAL | 0 refills | Status: DC | PRN

## 2016-07-11 NOTE — Telephone Encounter (Signed)
Patient calling about the request on oxycodone refill.

## 2016-07-11 NOTE — Telephone Encounter (Signed)
Ok to refill 

## 2016-07-11 NOTE — Telephone Encounter (Signed)
Patient requesting RX refill on Oxycodone.  Contact Info: [phone]

## 2016-07-11 NOTE — Telephone Encounter (Signed)
Pt. Called back about this refill.

## 2016-07-11 NOTE — Telephone Encounter (Signed)
Called patient and advised RX ready to be picked up at front desk.

## 2016-07-13 ENCOUNTER — Ambulatory Visit (INDEPENDENT_AMBULATORY_CARE_PROVIDER_SITE_OTHER): Payer: Medicare Other | Admitting: Specialist

## 2016-07-13 ENCOUNTER — Ambulatory Visit (INDEPENDENT_AMBULATORY_CARE_PROVIDER_SITE_OTHER): Payer: Medicare Other

## 2016-07-13 ENCOUNTER — Telehealth (INDEPENDENT_AMBULATORY_CARE_PROVIDER_SITE_OTHER): Payer: Self-pay | Admitting: Radiology

## 2016-07-13 ENCOUNTER — Encounter (INDEPENDENT_AMBULATORY_CARE_PROVIDER_SITE_OTHER): Payer: Self-pay | Admitting: Specialist

## 2016-07-13 ENCOUNTER — Ambulatory Visit (HOSPITAL_COMMUNITY)
Admission: RE | Admit: 2016-07-13 | Discharge: 2016-07-13 | Disposition: A | Discharge status: Home / Self Care | Payer: Medicare Other | Source: Ambulatory Visit | Attending: Vascular Surgery | Admitting: Vascular Surgery

## 2016-07-13 VITALS — BP 126/81 | HR 98 | Ht 62.0 in | Wt 190.0 lb

## 2016-07-13 DIAGNOSIS — M7989 Other specified soft tissue disorders: Secondary | ICD-10-CM | POA: Insufficient documentation

## 2016-07-13 DIAGNOSIS — G8929 Other chronic pain: Secondary | ICD-10-CM

## 2016-07-13 DIAGNOSIS — M25561 Pain in right knee: Secondary | ICD-10-CM | POA: Diagnosis not present

## 2016-07-13 DIAGNOSIS — M79661 Pain in right lower leg: Secondary | ICD-10-CM | POA: Insufficient documentation

## 2016-07-13 MED ORDER — OXYCODONE-ACETAMINOPHEN 5-325 MG PO TABS: 1.0000 | ORAL_TABLET | ORAL | 0 refills | Status: DC | PRN

## 2016-07-13 NOTE — Patient Instructions (Signed)
Walking with full weight bearing as tolerated. Go to have a doppler study on the right leg to rule out an acute deep venous thrombosus, blood clot in the vein. New Percocet Rx given. Continue with the xarelto. Return appointment in 2 weeks.

## 2016-07-13 NOTE — Telephone Encounter (Signed)
Received call from [facility] stating patient's doppler was negative for DVT.  He sent the patient home.

## 2016-07-13 NOTE — Telephone Encounter (Signed)
We still need to see her tomorrow to place in a compression dressing to decrease swelling. Please schedule her to come in tomorrow to have a compression dressing for venous stasis. Study reviewed, no DVT, good.

## 2016-07-13 NOTE — Telephone Encounter (Signed)
Please review message below. Results for doppler on Jackie Parker

## 2016-07-13 NOTE — Progress Notes (Addendum)
Post-Op Visit Note   Patient: Jackie Parker           Date of Birth: [birth year]           MRN: [id number] Visit Date: 07/13/2016 PCP: Philis Fendt, MD   Assessment & Plan:  Chief Complaint:  Patient is returning 2 weeks post op from right knee total arthroplasty. States she is having constant pain in her right knee. Still having some swelling. Ambulates today with walker. Doing therapy 1-2 times a weeks, bending on CPM machine to 60 degrees. Taking percocet for pain. Patient notes that she was unable to obtain a new prescription for Percocet as a pharmacy had indicated that it was too early. He is out of Percocet medications today.  Visit Diagnoses:  1. Chronic pain of right knee   2. Pain and swelling of lower leg, right   Examination shows swelling of the right thigh and right calf at least an inch and a half greater than the left side. The anterior right knee incision is healing nicely with no drainage range of motion of the knee is 7 short of full extension and flexion possible most 80. She is standing and ambulating full weightbearing using a walker. Right leg shows warmth over the thigh knee and calf. There is swelling into the right foot. Assessment: Patient is 2 weeks following right total knee arthroplasty she has swelling in the right thigh and right calf and a knee on Xarelto for anti-DVT phylaxis due to elevation of creatinine postoperatively requiring discontinuance of any nonsteroidal anti-inflammatory agents and aspirin. As swelling is in both the thigh and calf this is consistent with venous stasis which she has a history of however there is significant risk of a underlying acute deep venous thrombosis even though she is on Xarelto. Need to rule out an acute deep venous thrombosis of the right leg. Plan to see her back following these studies in order to place a compression dressing on the right leg to diminish swelling here regardless of the results of the Doppler  test.  Plan:Walking with full weight bearing as tolerated. Go to have a doppler study on the right leg to rule out an acute deep venous thrombosus, blood clot in the vein. New Percocet Rx given. Continue with the xarelto. Return appointment in 2 weeks. Return appointment in 1 day to have decompression dressing applied to the right lower extremity to decrease swelling here due to venous stasis versus DVT.  Follow-Up Instructions: No Follow-up on file.   Orders:  Orders Placed This Encounter  Procedures  . XR Knee 1-2 Views Right   Meds ordered this encounter  Medications  . oxyCODONE-acetaminophen (ROXICET) 5-325 MG tablet    Sig: Take 1-2 tablets by mouth every 4 (four) hours as needed for severe pain.    Dispense:  60 tablet    Refill:  0    Please this prescription early as she is taking more frequently, Rx is changed to reflect this.     PMFS History: Patient Active Problem List   Diagnosis Date Noted  . Osteoarthritis of left knee 12/14/2015    Priority: High    Class: End Stage  . Leukocytosis 06/29/2016  . Anemia 06/29/2016  . Fever   . Acute blood loss anemia   . AKI (acute kidney injury) ([place])   . Primary localized osteoarthritis of right knee 06/27/2016  . Osteoarthritis of right knee 12/14/2015  . Small bowel obstruction, partial 08/30/2013  . Acute renal  failure ([place]) 08/30/2013  . 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 N&V (nausea and vomiting) 08/30/2013  . Abdominal pain, acute 08/30/2013  . SBO (small bowel obstruction) 08/30/2013  . Hypokalemia 08/30/2013  . Partial small bowel obstruction 08/30/2013  . Postmenopausal bleeding 03/18/2013  . OSTEOARTHRITIS, KNEE, LEFT 03/20/2010  . TRICUSPID REGURGITATION, MILD 03/03/2010  . RENAL INSUFFICIENCY 03/02/2010  . MORBID OBESITY 02/18/2010  . PREMATURE ATRIAL CONTRACTIONS 02/18/2010  . PREMATURE VENTRICULAR CONTRACTIONS 02/18/2010  . CARDIAC MURMUR 02/18/2010  . EDEMA 10/20/2009  . HYPOKALEMIA  07/30/2009  . RIB PAIN, RIGHT SIDED 05/26/2008  . ABDOMINAL PAIN, LEFT LOWER QUADRANT 12/31/2007  . ALLERGIC RHINITIS 06/25/2007  . ABNORMAL BLOOD CHEMISTRY , OTHER 06/25/2007  . UNSPECIFIED VITAMIN D DEFICIENCY 04/30/2007  . Hypocalcemia 04/19/2007  . GLAUCOMA NEC 04/19/2007  . HYPERTENSION, ESSENTIAL NOS 04/19/2007  . ASTHMA 04/19/2007  . COLITIS, ULCERATIVE NOS 04/19/2007  . DISORDER, MENSTRUAL NEC 04/19/2007  . PSORIASIS 04/19/2007  . 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 GOUT NOS 02/21/2006   Past Medical History:  Diagnosis Date  . AKI (acute kidney injury) ([place]) 12/2015  . Allergy    takes Singulair and Zyrtec daily  . Asthma    uses Adair daily  . Cataracts, bilateral   . Colitis, ulcerative ([place])   . DJD (degenerative joint disease)   . Glaucoma    uses eye drops daily  . History of blood transfusion    no abnormal reaction noted. 40+ yrs ago  . History of bronchitis    yrs ago  . History of gout    not on any meds  . Hypertension    takes Amlodipine daily  . Joint pain   . Joint swelling   . Psoriasis   . Urinary frequency    takes Ditropan daily  . Vertigo    doesn't take any meds  . Vitamin D deficiency     Family History  Problem Relation Age of Onset  . Hypertension Sister   . Glaucoma Sister     Past Surgical History:  Procedure Laterality Date  . BACK SURGERY    . COLONOSCOPY    . ILEOSTOMY    . KNEE ARTHROPLASTY Left 12/14/2015   Procedure: COMPUTER ASSISTED LEFT TOTAL KNEE ARTHROPLASTY;  Surgeon: Jessy Oto, MD;  Location: [facility];  Service: Orthopedics;  Laterality: Left;  . TOTAL KNEE ARTHROPLASTY Left 12/14/2015  . TOTAL KNEE ARTHROPLASTY Right 06/27/2016  . TOTAL KNEE ARTHROPLASTY Right 06/27/2016   Procedure: RIGHT TOTAL KNEE ARTHROPLASTY;  Surgeon: Jessy Oto, MD;  Location: [facility];  Service: Orthopedics;  Laterality: Right;  . TUBAL LIGATION     Social History   Occupational History  . Not on file.   Social History  Main Topics  . Smoking status: Never Smoker  . Smokeless tobacco: Never Used  . Alcohol use No  . Drug use: No  . Sexual activity: No

## 2016-07-13 NOTE — Telephone Encounter (Signed)
Noted, Called patient and left vm advising to come in tomorrow so we can wrap her legs. Appt made in system for 9am.  Thanks. Nira Conn

## 2016-07-14 ENCOUNTER — Encounter (INDEPENDENT_AMBULATORY_CARE_PROVIDER_SITE_OTHER): Payer: Self-pay | Admitting: Specialist

## 2016-07-14 ENCOUNTER — Ambulatory Visit (INDEPENDENT_AMBULATORY_CARE_PROVIDER_SITE_OTHER): Payer: Medicare Other | Admitting: Specialist

## 2016-07-14 DIAGNOSIS — M7989 Other specified soft tissue disorders: Secondary | ICD-10-CM

## 2016-07-14 DIAGNOSIS — M79661 Pain in right lower leg: Secondary | ICD-10-CM

## 2016-07-14 NOTE — Progress Notes (Signed)
Office Visit Note   Patient: Jackie Parker           Date of Birth: 1941/03/25           MRN: [id number] Visit Date: 07/14/2016              Requested by: Nolene Ebbs, MD [place],  [place] [id number] PCP: Philis Fendt, MD   Assessment & Plan: Visit Diagnoses: No diagnosis found.  Plan:   Follow-Up Instructions: No Follow-up on file.   Orders:  No orders of the defined types were placed in this encounter.  No orders of the defined types were placed in this encounter.     Procedures: No procedures performed   Clinical Data: No additional findings.   Subjective: No chief complaint on file.   Patient returning today as a nurse visit only to get her right leg wraped.    Review of Systems   Objective: Vital Signs: There were no vitals taken for this visit.  Physical Exam  Ortho Exam  Specialty Comments:  No specialty comments available.  Imaging: No results found.   PMFS History: Patient Active Problem List   Diagnosis Date Noted  . Leukocytosis 06/29/2016  . Anemia 06/29/2016  . Fever   . Acute blood loss anemia   . AKI (acute kidney injury) ([place])   . Primary localized osteoarthritis of right knee 06/27/2016  . Osteoarthritis of left knee 12/14/2015    Class: End Stage  . Osteoarthritis of right knee 12/14/2015  . Small bowel obstruction, partial 08/30/2013  . Acute renal failure ([place]) 08/30/2013  . 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 N&V (nausea and vomiting) 08/30/2013  . Abdominal pain, acute 08/30/2013  . SBO (small bowel obstruction) 08/30/2013  . Hypokalemia 08/30/2013  . Partial small bowel obstruction 08/30/2013  . Postmenopausal bleeding 03/18/2013  . OSTEOARTHRITIS, KNEE, LEFT 03/20/2010  . TRICUSPID REGURGITATION, MILD 03/03/2010  . RENAL INSUFFICIENCY 03/02/2010  . MORBID OBESITY 02/18/2010  . PREMATURE ATRIAL CONTRACTIONS 02/18/2010  . PREMATURE VENTRICULAR CONTRACTIONS 02/18/2010  . CARDIAC  MURMUR 02/18/2010  . EDEMA 10/20/2009  . HYPOKALEMIA 07/30/2009  . RIB PAIN, RIGHT SIDED 05/26/2008  . ABDOMINAL PAIN, LEFT LOWER QUADRANT 12/31/2007  . ALLERGIC RHINITIS 06/25/2007  . ABNORMAL BLOOD CHEMISTRY , OTHER 06/25/2007  . UNSPECIFIED VITAMIN D DEFICIENCY 04/30/2007  . Hypocalcemia 04/19/2007  . GLAUCOMA NEC 04/19/2007  . HYPERTENSION, ESSENTIAL NOS 04/19/2007  . ASTHMA 04/19/2007  . COLITIS, ULCERATIVE NOS 04/19/2007  . DISORDER, MENSTRUAL NEC 04/19/2007  . PSORIASIS 04/19/2007  . 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 GOUT NOS 02/21/2006   Past Medical History:  Diagnosis Date  . AKI (acute kidney injury) ([place]) 12/2015  . Allergy    takes Singulair and Zyrtec daily  . Asthma    uses Adair daily  . Cataracts, bilateral   . Colitis, ulcerative ([place])   . DJD (degenerative joint disease)   . Glaucoma    uses eye drops daily  . History of blood transfusion    no abnormal reaction noted. 40+ yrs ago  . History of bronchitis    yrs ago  . History of gout    not on any meds  . Hypertension    takes Amlodipine daily  . Joint pain   . Joint swelling   . Psoriasis   . Urinary frequency    takes Ditropan daily  . Vertigo    doesn't take any meds  . Vitamin D deficiency  Family History  Problem Relation Age of Onset  . Hypertension Sister   . Glaucoma Sister     Past Surgical History:  Procedure Laterality Date  . BACK SURGERY    . COLONOSCOPY    . ILEOSTOMY    . KNEE ARTHROPLASTY Left 12/14/2015   Procedure: COMPUTER ASSISTED LEFT TOTAL KNEE ARTHROPLASTY;  Surgeon: Jessy Oto, MD;  Location: [facility];  Service: Orthopedics;  Laterality: Left;  . TOTAL KNEE ARTHROPLASTY Left 12/14/2015  . TOTAL KNEE ARTHROPLASTY Right 06/27/2016  . TOTAL KNEE ARTHROPLASTY Right 06/27/2016   Procedure: RIGHT TOTAL KNEE ARTHROPLASTY;  Surgeon: Jessy Oto, MD;  Location: [facility];  Service: Orthopedics;  Laterality: Right;  . TUBAL LIGATION     Social History    Occupational History  . Not on file.   Social History Main Topics  . Smoking status: Never Smoker  . Smokeless tobacco: Never Used  . Alcohol use No  . Drug use: No  . Sexual activity: No

## 2016-07-18 ENCOUNTER — Telehealth (INDEPENDENT_AMBULATORY_CARE_PROVIDER_SITE_OTHER): Payer: Self-pay | Admitting: *Deleted

## 2016-07-18 NOTE — Telephone Encounter (Signed)
Jackie Parker called to get verbal orders Home health 2x for this week call back number is [phone]. Ok to leave message.

## 2016-07-18 NOTE — Telephone Encounter (Signed)
Called and gave verbal ok. Thanks.

## 2016-07-19 ENCOUNTER — Ambulatory Visit (INDEPENDENT_AMBULATORY_CARE_PROVIDER_SITE_OTHER): Payer: Medicare Other | Admitting: Specialist

## 2016-07-19 ENCOUNTER — Encounter (INDEPENDENT_AMBULATORY_CARE_PROVIDER_SITE_OTHER): Payer: Self-pay | Admitting: Specialist

## 2016-07-19 VITALS — BP 142/70 | HR 89 | Ht 62.0 in | Wt 190.0 lb

## 2016-07-19 DIAGNOSIS — Z96653 Presence of artificial knee joint, bilateral: Secondary | ICD-10-CM | POA: Diagnosis not present

## 2016-07-19 DIAGNOSIS — I872 Venous insufficiency (chronic) (peripheral): Secondary | ICD-10-CM | POA: Diagnosis not present

## 2016-07-19 NOTE — Progress Notes (Addendum)
Office Visit Note   Patient: Jackie Parker           Date of Birth: 08-30-1941           MRN: [id number] Visit Date: 07/19/2016              Requested by: Nolene Ebbs, MD [address] [place], [place] [address] PCP: Philis Fendt, MD   Assessment & Plan: Visit Diagnoses:  1. Venous stasis dermatitis of both lower extremities   2. S/P TKR (total knee replacement) using cement, bilateral   This patient seen today in follow-up of her total knee arthroplasty was seen 2 weeks following her surgery 7 days with severe right lower extremity swelling involving her thigh and calf. Doppler studies that show no sign of deep venous thrombosis and she does have a history of venous stasis and had right lower extremity swelling following her left total knee arthroplasty earlier this year. She is continuing on symptoms are Xarelto as she is unable to take aspirin due to renal efficiency. She had intraoperatively a elevation transiently of her creatinine was IV Toradol and aspirin and was then changed to Xarelto. Results of her studies indicating no DVT she was placed in a right lower extremity Dynaflex using from the right foot to the right knee. Her return today shows swelling is markedly improved below the right knee there is only mild swelling above the right knee. Dressing removed and she will resume using his TED stockings for support her right lower extremity and to decrease venous congestion postop. We'll see her back in return in 2 weeks for follow-up. Current home health nursing for physical therapy will be ending soon so that a prescription for patient to physical therapy for the right total knee arthroplasty and lower extremity swelling due to venous stasis is provided. She has adequate narcotic pain medicines contact us if she requires renewal or refill  Plan: Keep knee incision dry for 5 days post op then may wet while bathing. Therapy daily and CPM goal full extension and greater than 90  degrees flexion. Call if fever or chills or increased drainage. Go to ER if acutely short of breath or call for ambulance. Return for follow up in 2 weeks. May full weight bear on the surgical leg unless told otherwise. Use knee immobilizer until able to straight leg raise off bed with knee stable. In house walking for first 2 weeks. INSTRUCTIONS AFTER JOINT REPLACEMENT   o Remove items at home which could result in a fall. This includes throw rugs or furniture in walking pathways o ICE to the affected joint every three hours while awake for 30 minutes at a time, for at least the first 3-5 days, and then as needed for pain and swelling.  Continue to use ice for pain and swelling. You may notice swelling that will progress down to the foot and ankle.  This is normal after surgery.  Elevate your leg when you are not up walking on it.   o Continue to use the breathing machine you got in the hospital (incentive spirometer) which will help keep your temperature down.  It is common for your temperature to cycle up and down following surgery, especially at night when you are not up moving around and exerting yourself.  The breathing machine keeps your lungs expanded and your temperature down.   DIET:  As you were doing prior to hospitalization, we recommend a well-balanced diet.  DRESSING / WOUND CARE / SHOWERING  You may shower and no dressing is necessary  For the right leg. ACTIVITY  o Increase activity slowly as tolerated, but follow the weight bearing instructions below.   o No driving for 6 weeks or until further direction given by your physician.  You cannot drive while taking narcotics.  o No lifting or carrying greater than 10 lbs. until further directed by your surgeon. o Avoid periods of inactivity such as sitting longer than an hour when not asleep. This helps prevent blood clots.  o You may return to work once you are authorized by your doctor.     WEIGHT BEARING   Weight bearing  as tolerated with assist device (walker, cane, etc) as directed, use it as long as suggested by your surgeon or therapist, typically at least 4-6 weeks.   EXERCISES  Results after joint replacement surgery are often greatly improved when you follow the exercise, range of motion and muscle strengthening exercises prescribed by your doctor. Safety measures are also important to protect the joint from further injury. Any time any of these exercises cause you to have increased pain or swelling, decrease what you are doing until you are comfortable again and then slowly increase them. If you have problems or questions, call your caregiver or physical therapist for advice.   Rehabilitation is important following a joint replacement. After just a few days of immobilization, the muscles of the leg can become weakened and shrink (atrophy).  These exercises are designed to build up the tone and strength of the thigh and leg muscles and to improve motion. Often times heat used for twenty to thirty minutes before working out will loosen up your tissues and help with improving the range of motion but do not use heat for the first two weeks following surgery (sometimes heat can increase post-operative swelling).   These exercises can be done on a training (exercise) mat, on the floor, on a table or on a bed. Use whatever works the best and is most comfortable for you.    Use music or television while you are exercising so that the exercises are a pleasant break in your day. This will make your life better with the exercises acting as a break in your routine that you can look forward to.   Perform all exercises about fifteen times, three times per day or as directed.  You should exercise both the operative leg and the other leg as well.  Exercises include:   . Quad Sets - Tighten up the muscle on the front of the thigh (Quad) and hold for 5-10 seconds.   . Straight Leg Raises - With your knee straight (if you were  given a brace, keep it on), lift the leg to 60 degrees, hold for 3 seconds, and slowly lower the leg.  Perform this exercise against resistance later as your leg gets stronger.  . Leg Slides: Lying on your back, slowly slide your foot toward your buttocks, bending your knee up off the floor (only go as far as is comfortable). Then slowly slide your foot back down until your leg is flat on the floor again.  Glenard Haring Wings: Lying on your back spread your legs to the side as far apart as you can without causing discomfort.  . Hamstring Strength:  Lying on your back, push your heel against the floor with your leg straight by tightening up the muscles of your buttocks.  Repeat, but this time bend your knee to a comfortable angle,  and push your heel against the floor.  You may put a pillow under the heel to make it more comfortable if necessary.   A rehabilitation program following joint replacement surgery can speed recovery and prevent re-injury in the future due to weakened muscles. Contact your doctor or a physical therapist for more information on knee rehabilitation.    CONSTIPATION  Constipation is defined medically as fewer than three stools per week and severe constipation as less than one stool per week.  Even if you have a regular bowel pattern at home, your normal regimen is likely to be disrupted due to multiple reasons following surgery.  Combination of anesthesia, postoperative narcotics, change in appetite and fluid intake all can affect your bowels.   YOU MUST use at least one of the following options; they are listed in order of increasing strength to get the job done.  They are all available over the counter, and you may need to use some, POSSIBLY even all of these options:    Drink plenty of fluids (prune juice may be helpful) and high fiber foods Colace 100 mg by mouth twice a day  Senokot for constipation as directed and as needed Dulcolax (bisacodyl), take with full glass of water    Miralax (polyethylene glycol) once or twice a day as needed.  TED HOSE STOCKINGS:  Use stockings on both legs until for at least 6 weeks or as directed by physician office. They may be removed at night for sleeping.  PRECAUTIONS:  If you experience chest pain or shortness of breath - call 911 immediately for transfer to the hospital emergency department.   If you develop a fever greater that 101 F, purulent drainage from wound, increased redness or drainage from wound, foul odor from the wound/dressing, or calf pain - CONTACT YOUR SURGEON.                                                   FOLLOW-UP APPOINTMENTS:  In 3 weeks OTHER INSTRUCTIONS:   Knee Replacement:  Do not place pillow under knee, focus on keeping the knee straight while resting. CPM instructions: 0-90 degrees, 2 hours in the morning, 2 hours in the afternoon, and 2 hours in the evening. Place foam block, curve side up under heel at all times except when in CPM or when walking.  DO NOT modify, tear, cut, or change the foam block in any way.  MAKE SURE YOU:  . Understand these instructions.  . Get help right away if you are not doing well or get worse.    Thank you for letting us be a part of your medical care team.  It is a privilege we respect greatly.  We hope these instructions will help you stay on track for a fast and full recovery!   Follow-Up Instructions: Return in about 3 weeks (around 08/09/2016) for Follow up of total knee replacement.   Orders:  Orders Placed This Encounter  Procedures  . Ambulatory referral to Physical Therapy   No orders of the defined types were placed in this encounter.     Procedures: No procedures performed   Clinical Data: No additional findings.   Subjective: Chief Complaint  Patient presents with  . Right Leg - Follow-up    Patient presents in office today for one week follow up of right lower  extremity compression wrap. Dynaflex multilayer compression wrap was  applied in office last week and removed today in office. There is excellent decrease in swelling and wrinkling of the skin.  She states overall she is feeling well. She has one compression stocking at home she could transition to. She is taking oxycodone on prn basis for her pain.Wore the below knee compression wrap for 5 days. 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 No new complaints.     Review of Systems  Constitutional: Negative.   HENT: Negative.   Eyes: Negative.   Respiratory: Negative.   Cardiovascular: Positive for leg swelling.  Gastrointestinal: Negative.   Endocrine: Negative.   Genitourinary: Negative.   Musculoskeletal: Negative.   Allergic/Immunologic: Negative.   Neurological: Negative.   Hematological: Negative.   Psychiatric/Behavioral: Negative.      Objective: Vital Signs: BP (!) 142/70 (BP Location: Right Arm, Patient Position: Sitting)   Pulse 89   Ht 5' 2"  (1.575 m)   Wt 190 lb (86.2 kg)   BMI 34.75 kg/m   Physical Exam  Constitutional: She is oriented to person, place, and time. She appears well-developed and well-nourished.  Eyes: EOM are normal. Pupils are equal, round, and reactive to light.  Neck: Normal range of motion. Neck supple.  Pulmonary/Chest: Effort normal and breath sounds normal.  Abdominal: Soft. Bowel sounds are normal.  Musculoskeletal: Normal range of motion. She exhibits no edema, tenderness or deformity.  Neurological: She is alert and oriented to person, place, and time. She displays normal reflexes. No cranial nerve deficit or sensory deficit. She exhibits normal muscle tone. Coordination normal.  Skin: Skin is warm and dry.  Psychiatric: She has a normal mood and affect. Her behavior is normal. Judgment and thought content normal.    Right Knee Exam   Range of Motion  Extension: -5  Flexion: 90   Muscle Strength   The patient has normal right knee  strength.  Tests  McMurray:  Medial - negative Lateral - negative Lachman:  Anterior - negative    Posterior - negative Drawer:       Anterior - negative    Posterior - negative Varus: negative Valgus: negative Pivot Shift: negative Patellar Apprehension: positive  Other  Erythema: absent Scars: present Sensation: normal Pulse: present Swelling: mild  Comments:  Right knee ROM is -10 to 90 degrees, incision is healing with out erythrema or Drainage.      Specialty Comments:  No specialty comments available.  Imaging: No results found.   PMFS History: Patient Active Problem List   Diagnosis Date Noted  . Osteoarthritis of left knee 12/14/2015    Priority: High    Class: End Stage  . Leukocytosis 06/29/2016  . Anemia 06/29/2016  . Fever   . Acute blood loss anemia   . AKI (acute kidney injury) ([place])   . Primary localized osteoarthritis of right knee 06/27/2016  . Osteoarthritis of right knee 12/14/2015  . Small bowel obstruction, partial 08/30/2013  . Acute renal failure ([place]) 08/30/2013  . 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 N&V (nausea and vomiting) 08/30/2013  . Abdominal pain, acute 08/30/2013  . SBO (small bowel obstruction) 08/30/2013  . Hypokalemia 08/30/2013  . Partial small bowel obstruction 08/30/2013  . Postmenopausal bleeding 03/18/2013  . OSTEOARTHRITIS, KNEE, LEFT 03/20/2010  . TRICUSPID REGURGITATION, MILD 03/03/2010  . RENAL INSUFFICIENCY 03/02/2010  . MORBID OBESITY 02/18/2010  . PREMATURE ATRIAL CONTRACTIONS 02/18/2010  .  PREMATURE VENTRICULAR CONTRACTIONS 02/18/2010  . CARDIAC MURMUR 02/18/2010  . EDEMA 10/20/2009  . HYPOKALEMIA 07/30/2009  . RIB PAIN, RIGHT SIDED 05/26/2008  . ABDOMINAL PAIN, LEFT LOWER QUADRANT 12/31/2007  . ALLERGIC RHINITIS 06/25/2007  . ABNORMAL BLOOD CHEMISTRY , OTHER 06/25/2007  . UNSPECIFIED VITAMIN D DEFICIENCY 04/30/2007  . Hypocalcemia 04/19/2007  . GLAUCOMA NEC 04/19/2007  . HYPERTENSION,  ESSENTIAL NOS 04/19/2007  . ASTHMA 04/19/2007  . COLITIS, ULCERATIVE NOS 04/19/2007  . DISORDER, MENSTRUAL NEC 04/19/2007  . PSORIASIS 04/19/2007  . 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 GOUT NOS 02/21/2006   Past Medical History:  Diagnosis Date  . AKI (acute kidney injury) ([place]) 12/2015  . Allergy    takes Singulair and Zyrtec daily  . Asthma    uses Adair daily  . Cataracts, bilateral   . Colitis, ulcerative ([place])   . DJD (degenerative joint disease)   . Glaucoma    uses eye drops daily  . History of blood transfusion    no abnormal reaction noted. 40+ yrs ago  . History of bronchitis    yrs ago  . History of gout    not on any meds  . Hypertension    takes Amlodipine daily  . Joint pain   . Joint swelling   . Psoriasis   . Urinary frequency    takes Ditropan daily  . Vertigo    doesn't take any meds  . Vitamin D deficiency     Family History  Problem Relation Age of Onset  . Hypertension Sister   . Glaucoma Sister     Past Surgical History:  Procedure Laterality Date  . BACK SURGERY    . COLONOSCOPY    . ILEOSTOMY    . KNEE ARTHROPLASTY Left 12/14/2015   Procedure: COMPUTER ASSISTED LEFT TOTAL KNEE ARTHROPLASTY;  Surgeon: Jessy Oto, MD;  Location: [facility];  Service: Orthopedics;  Laterality: Left;  . TOTAL KNEE ARTHROPLASTY Left 12/14/2015  . TOTAL KNEE ARTHROPLASTY Right 06/27/2016  . TOTAL KNEE ARTHROPLASTY Right 06/27/2016   Procedure: RIGHT TOTAL KNEE ARTHROPLASTY;  Surgeon: Jessy Oto, MD;  Location: [facility];  Service: Orthopedics;  Laterality: Right;  . TUBAL LIGATION     Social History   Occupational History  . Not on file.   Social History Main Topics  . Smoking status: Never Smoker  . Smokeless tobacco: Never Used  . Alcohol use No  . Drug use: No  . Sexual activity: No

## 2016-07-19 NOTE — Addendum Note (Signed)
Addended by: Basil Dess on: 07/19/2016 10:49 AM   Modules accepted: Orders

## 2016-07-19 NOTE — Patient Instructions (Signed)
Keep knee incision dry for 5 days post op then may wet while bathing. Therapy daily and CPM goal full extension and greater than 90 degrees flexion. Call if fever or chills or increased drainage. Go to ER if acutely short of breath or call for ambulance. Return for follow up in 2 weeks. May full weight bear on the surgical leg unless told otherwise. Use knee immobilizer until able to straight leg raise off bed with knee stable. In house walking for first 2 weeks. INSTRUCTIONS AFTER JOINT REPLACEMENT   o Remove items at home which could result in a fall. This includes throw rugs or furniture in walking pathways o ICE to the affected joint every three hours while awake for 30 minutes at a time, for at least the first 3-5 days, and then as needed for pain and swelling.  Continue to use ice for pain and swelling. You may notice swelling that will progress down to the foot and ankle.  This is normal after surgery.  Elevate your leg when you are not up walking on it.   o Continue to use the breathing machine you got in the hospital (incentive spirometer) which will help keep your temperature down.  It is common for your temperature to cycle up and down following surgery, especially at night when you are not up moving around and exerting yourself.  The breathing machine keeps your lungs expanded and your temperature down.   DIET:  As you were doing prior to hospitalization, we recommend a well-balanced diet.  DRESSING / WOUND CARE / SHOWERING You may shower and no dressing is necessary  For the right leg. ACTIVITY  o Increase activity slowly as tolerated, but follow the weight bearing instructions below.   o No driving for 6 weeks or until further direction given by your physician.  You cannot drive while taking narcotics.  o No lifting or carrying greater than 10 lbs. until further directed by your surgeon. o Avoid periods of inactivity such as sitting longer than an hour when not asleep.  This helps prevent blood clots.  o You may return to work once you are authorized by your doctor.     WEIGHT BEARING   Weight bearing as tolerated with assist device (walker, cane, etc) as directed, use it as long as suggested by your surgeon or therapist, typically at least 4-6 weeks.   EXERCISES  Results after joint replacement surgery are often greatly improved when you follow the exercise, range of motion and muscle strengthening exercises prescribed by your doctor. Safety measures are also important to protect the joint from further injury. Any time any of these exercises cause you to have increased pain or swelling, decrease what you are doing until you are comfortable again and then slowly increase them. If you have problems or questions, call your caregiver or physical therapist for advice.   Rehabilitation is important following a joint replacement. After just a few days of immobilization, the muscles of the leg can become weakened and shrink (atrophy).  These exercises are designed to build up the tone and strength of the thigh and leg muscles and to improve motion. Often times heat used for twenty to thirty minutes before working out will loosen up your tissues and help with improving the range of motion but do not use heat for the first two weeks following surgery (sometimes heat can increase post-operative swelling).   These exercises can be done on a training (exercise) mat, on the floor,  on a table or on a bed. Use whatever works the best and is most comfortable for you.    Use music or television while you are exercising so that the exercises are a pleasant break in your day. This will make your life better with the exercises acting as a break in your routine that you can look forward to.   Perform all exercises about fifteen times, three times per day or as directed.  You should exercise both the operative leg and the other leg as well.  Exercises include:   . Quad Sets - Tighten  up the muscle on the front of the thigh (Quad) and hold for 5-10 seconds.   . Straight Leg Raises - With your knee straight (if you were given a brace, keep it on), lift the leg to 60 degrees, hold for 3 seconds, and slowly lower the leg.  Perform this exercise against resistance later as your leg gets stronger.  . Leg Slides: Lying on your back, slowly slide your foot toward your buttocks, bending your knee up off the floor (only go as far as is comfortable). Then slowly slide your foot back down until your leg is flat on the floor again.  Glenard Haring Wings: Lying on your back spread your legs to the side as far apart as you can without causing discomfort.  . Hamstring Strength:  Lying on your back, push your heel against the floor with your leg straight by tightening up the muscles of your buttocks.  Repeat, but this time bend your knee to a comfortable angle, and push your heel against the floor.  You may put a pillow under the heel to make it more comfortable if necessary.   A rehabilitation program following joint replacement surgery can speed recovery and prevent re-injury in the future due to weakened muscles. Contact your doctor or a physical therapist for more information on knee rehabilitation.    CONSTIPATION  Constipation is defined medically as fewer than three stools per week and severe constipation as less than one stool per week.  Even if you have a regular bowel pattern at home, your normal regimen is likely to be disrupted due to multiple reasons following surgery.  Combination of anesthesia, postoperative narcotics, change in appetite and fluid intake all can affect your bowels.   YOU MUST use at least one of the following options; they are listed in order of increasing strength to get the job done.  They are all available over the counter, and you may need to use some, POSSIBLY even all of these options:    Drink plenty of fluids (prune juice may be helpful) and high fiber  foods Colace 100 mg by mouth twice a day  Senokot for constipation as directed and as needed Dulcolax (bisacodyl), take with full glass of water  Miralax (polyethylene glycol) once or twice a day as needed.  TED HOSE STOCKINGS:  Use stockings on both legs until for at least 6 weeks or as directed by physician office. They may be removed at night for sleeping.  PRECAUTIONS:  If you experience chest pain or shortness of breath - call 911 immediately for transfer to the hospital emergency department.   If you develop a fever greater that 101 F, purulent drainage from wound, increased redness or drainage from wound, foul odor from the wound/dressing, or calf pain - CONTACT YOUR SURGEON.  FOLLOW-UP APPOINTMENTS:  In 3 weeks OTHER INSTRUCTIONS:   Knee Replacement:  Do not place pillow under knee, focus on keeping the knee straight while resting. CPM instructions: 0-90 degrees, 2 hours in the morning, 2 hours in the afternoon, and 2 hours in the evening. Place foam block, curve side up under heel at all times except when in CPM or when walking.  DO NOT modify, tear, cut, or change the foam block in any way.  MAKE SURE YOU:  . Understand these instructions.  . Get help right away if you are not doing well or get worse.    Thank you for letting us be a part of your medical care team.  It is a privilege we respect greatly.  We hope these instructions will help you stay on track for a fast and full recovery!

## 2016-07-25 ENCOUNTER — Encounter: Payer: Self-pay | Admitting: Physical Therapy

## 2016-07-25 ENCOUNTER — Ambulatory Visit: Payer: Medicare Other | Attending: Specialist | Admitting: Physical Therapy

## 2016-07-25 DIAGNOSIS — M25661 Stiffness of right knee, not elsewhere classified: Secondary | ICD-10-CM | POA: Diagnosis present

## 2016-07-25 DIAGNOSIS — M25561 Pain in right knee: Secondary | ICD-10-CM

## 2016-07-25 DIAGNOSIS — R262 Difficulty in walking, not elsewhere classified: Secondary | ICD-10-CM | POA: Insufficient documentation

## 2016-07-25 DIAGNOSIS — M6281 Muscle weakness (generalized): Secondary | ICD-10-CM | POA: Insufficient documentation

## 2016-07-25 NOTE — Therapy (Signed)
[place], [place], [address] Phone: [phone]   Fax:  [phone]  Physical Therapy Evaluation  Patient Details  Name: Jackie Parker MRN: [id number] Date of Birth: [birth year] Referring Provider: Jessy Oto, MD  Encounter Date: 07/25/2016      PT End of Session - 07/25/16 1023    Visit Number 1   Number of Visits 9   Date for PT Re-Evaluation 08/22/16  MCR re-eval date, POC through 12/15   Authorization Type [employer] MCR-KX beginning first visit, therapy earlier in the year   PT Start Time 1015   PT Stop Time 1056   PT Time Calculation (min) 41 min   Activity Tolerance Patient tolerated treatment well   Behavior During Therapy [facility] for tasks assessed/performed      Past Medical History:  Diagnosis Date  . AKI (acute kidney injury) ([place]) 12/2015  . Allergy    takes Singulair and Zyrtec daily  . Asthma    uses Adair daily  . Cataracts, bilateral   . Colitis, ulcerative ([place])   . DJD (degenerative joint disease)   . Glaucoma    uses eye drops daily  . History of blood transfusion    no abnormal reaction noted. 40+ yrs ago  . History of bronchitis    yrs ago  . History of gout    not on any meds  . Hypertension    takes Amlodipine daily  . Joint pain   . Joint swelling   . Psoriasis   . Urinary frequency    takes Ditropan daily  . Vertigo    doesn't take any meds  . Vitamin D deficiency     Past Surgical History:  Procedure Laterality Date  . BACK SURGERY    . COLONOSCOPY    . ILEOSTOMY    . KNEE ARTHROPLASTY Left 12/14/2015   Procedure: COMPUTER ASSISTED LEFT TOTAL KNEE ARTHROPLASTY;  Surgeon: Jessy Oto, MD;  Location: [place];  Service: Orthopedics;  Laterality: Left;  . TOTAL KNEE ARTHROPLASTY Left 12/14/2015  . TOTAL KNEE ARTHROPLASTY Right 06/27/2016  . TOTAL KNEE ARTHROPLASTY Right 06/27/2016   Procedure: RIGHT TOTAL KNEE ARTHROPLASTY;  Surgeon: Jessy Oto, MD;   Location: [facility];  Service: Orthopedics;  Laterality: Right;  . TUBAL LIGATION      There were no vitals filed for this visit.       Subjective Assessment - 07/25/16 1023    How long can you walk comfortably? 20 min   Patient Stated Goals walk, shop, sleep   Currently in Pain? Yes   Pain Score 7    Pain Location Knee   Pain Orientation Right   Pain Descriptors / Indicators Aching   Pain Type Surgical pain   Pain Frequency Constant   Aggravating Factors  none known   Pain Relieving Factors pain medications            [facility] PT Assessment - 07/25/16 0001      Assessment   Medical Diagnosis R TKA 06/27/16, venous stasis   Referring Provider Jessy Oto, MD   Onset Date/Surgical Date 06/27/16   Hand Dominance Right   Prior Therapy April 2017 OP, home health after Oct surgery     Precautions   Precautions None     Restrictions   Weight Bearing Restrictions No     Balance Screen   Has the patient fallen in the past 6 months No     Home Environment  Living Environment Private residence   Living Arrangements Children   Additional Comments 2 steps to enter     Prior Function   Level of Independence Independent     Cognition   Overall Cognitive Status Within Functional Limits for tasks assessed     Observation/Other Assessments   Focus on Therapeutic Outcomes (FOTO)  53% ability     Sensation   Additional Comments WFL     ROM / Strength   AROM / PROM / Strength PROM;Strength     PROM   PROM Assessment Site Knee   Right/Left Knee Right   Right Knee Extension -8   Right Knee Flexion 110     Strength   Strength Assessment Site Hip;Knee   Right/Left Hip Right   Right Hip Flexion 4/5   Right Hip Extension 4-/5  meas in supine   Right Hip ABduction 4-/5   Right/Left Knee Right   Right Knee Flexion 4+/5   Right Knee Extension 4+/5     Palpation   Palpation comment increased tension in fascia at and below knee     Ambulation/Gait   Assistive device  Straight cane  only for community ambulation   Gait Comments antalgic, flat feet,                   [facility] Adult PT Treatment/Exercise - 07/25/16 0001      Exercises   Exercises Knee/Hip     Knee/Hip Exercises: Stretches   Passive Hamstring Stretch Limitations seated EOB   Gastroc Stretch Limitations at counter     Knee/Hip Exercises: Aerobic   Recumbent Bike 5 min L1     Knee/Hip Exercises: Standing   Gait Training cues for heel toe in transition between exercises.      Knee/Hip Exercises: Seated   Long Arc Quad Limitations with 5 ankle pumps each lift     Manual Therapy   Manual therapy comments educated in slef massage for venous stasis                PT Education - 07/25/16 1057    Education provided Yes   Education Details anatomy of condition, POC, HEP, exercise form/rationale, Effects of swelling on ache, importance of leg elevation   Person(s) Educated Patient   Methods Explanation;Demonstration;Tactile cues;Verbal cues;Handout   Comprehension Verbalized understanding;Returned demonstration;Verbal cues required;Tactile cues required;Need further instruction          PT Short Term Goals - 07/25/16 1156      PT SHORT TERM GOAL #1   Title Pt will demo 0-120 deg knee ROM, indicating necessary ROM for functional daily activities by 12/15   Baseline see flowsheet   Time 4   Period Weeks   Status New     PT SHORT TERM GOAL #2   Title Gross MMT to 5/5 to indicate proper support to knee joint during daily activities   Baseline see flowsheet   Time 4   Period Weeks   Status New     PT SHORT TERM GOAL #3   Title FOTO to 62% to indicate significant functional improvement.    Baseline  53% at eval   Time 4   Period Weeks   Status New     PT SHORT TERM GOAL #4   Title Pt will demo SLS for at least 5 sec without UE support to indicate proper balance for safety following TKA   Baseline unable at eval   Time 4   Period Weeks  Status New      PT SHORT TERM GOAL #5   Title Pt will demo ability to climb stairs step over step with minimal UE assist to improve community ambulation ability   Baseline heavy use of UE at eval   Time 4   Period Weeks   Status New                  Plan - 07/25/16 1152    Clinical Impression Statement Pt presents to PT s/p TKA on 06/27/2016, she has had home health until now. Pt is also diagnosed with venous stasis in R leg. Pt c/o knee ache that is constant and minimally changed with movement. Pt demo good ROM and strength but will benefit from further skilled PT in order to reach functional goals.    Rehab Potential Good   PT Frequency 2x / week   PT Duration 4 weeks   PT Treatment/Interventions ADLs/Self Care Home Management;Cryotherapy;Electrical Stimulation;Functional mobility training;Stair training;Gait training;Moist Heat;Therapeutic activities;Therapeutic exercise;Balance training;Neuromuscular re-education;Patient/family education;Passive range of motion;Scar mobilization;Manual techniques;Taping;Vasopneumatic Device   PT Next Visit Plan bike, extension ROM, DF stretch, exercises with leg elevated    PT Home Exercise Plan gastroc stretch, hamstring stretch, LAQ with ankle pumps, elevate leg, heel toe gait.    Consulted and Agree with Plan of Care Patient      Patient will benefit from skilled therapeutic intervention in order to improve the following deficits and impairments:  Abnormal gait, Decreased range of motion, Difficulty walking, Decreased activity tolerance, Pain, Decreased strength, Increased edema  Visit Diagnosis: Right knee pain, unspecified chronicity - Plan: PT plan of care cert/re-cert  Difficulty in walking, not elsewhere classified - Plan: PT plan of care cert/re-cert  Stiffness of right knee, not elsewhere classified - Plan: PT plan of care cert/re-cert      G-Codes - 48/54/62 1200    Functional Assessment Tool Used FOTO 53% ability (goal 62% ability),  clinical judgement   Functional Limitation Mobility: Walking and moving around   Mobility: Walking and Moving Around Current Status (V0350) At least 40 percent but less than 60 percent impaired, limited or restricted   Mobility: Walking and Moving Around Goal Status (K9381) At least 20 percent but less than 40 percent impaired, limited or restricted       Problem List Patient Active Problem List   Diagnosis Date Noted  . Leukocytosis 06/29/2016  . Anemia 06/29/2016  . Fever   . Acute blood loss anemia   . AKI (acute kidney injury) ([place])   . Primary localized osteoarthritis of right knee 06/27/2016  . Osteoarthritis of left knee 12/14/2015    Class: End Stage  . Osteoarthritis of right knee 12/14/2015  . Small bowel obstruction, partial 08/30/2013  . Acute renal failure ([place]) 08/30/2013  . 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 N&V (nausea and vomiting) 08/30/2013  . Abdominal pain, acute 08/30/2013  . SBO (small bowel obstruction) 08/30/2013  . Hypokalemia 08/30/2013  . Partial small bowel obstruction 08/30/2013  . Postmenopausal bleeding 03/18/2013  . OSTEOARTHRITIS, KNEE, LEFT 03/20/2010  . TRICUSPID REGURGITATION, MILD 03/03/2010  . RENAL INSUFFICIENCY 03/02/2010  . MORBID OBESITY 02/18/2010  . PREMATURE ATRIAL CONTRACTIONS 02/18/2010  . PREMATURE VENTRICULAR CONTRACTIONS 02/18/2010  . CARDIAC MURMUR 02/18/2010  . EDEMA 10/20/2009  . HYPOKALEMIA 07/30/2009  . RIB PAIN, RIGHT SIDED 05/26/2008  . ABDOMINAL PAIN, LEFT LOWER QUADRANT 12/31/2007  . ALLERGIC RHINITIS 06/25/2007  . ABNORMAL BLOOD CHEMISTRY , OTHER 06/25/2007  . UNSPECIFIED VITAMIN  D DEFICIENCY 04/30/2007  . Hypocalcemia 04/19/2007  . GLAUCOMA NEC 04/19/2007  . HYPERTENSION, ESSENTIAL NOS 04/19/2007  . ASTHMA 04/19/2007  . COLITIS, ULCERATIVE NOS 04/19/2007  . DISORDER, MENSTRUAL NEC 04/19/2007  . PSORIASIS 04/19/2007  . 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 GOUT NOS 02/21/2006    Yaqub Arney C.  Milo Schreier PT, DPT 07/25/16 12:02 PM   [place] [facility] [address] [place], [place], [address] Phone: [phone]   Fax:  [phone]  Name: Macarena Langseth MRN: [id number] Date of Birth: 11-19-1940

## 2016-07-25 NOTE — Patient Instructions (Signed)
  Heel-toe gait Leg elevated/suspended with edema mobilization

## 2016-07-26 ENCOUNTER — Telehealth (INDEPENDENT_AMBULATORY_CARE_PROVIDER_SITE_OTHER): Payer: Self-pay | Admitting: Specialist

## 2016-07-26 DIAGNOSIS — M7989 Other specified soft tissue disorders: Secondary | ICD-10-CM

## 2016-07-26 DIAGNOSIS — M25561 Pain in right knee: Principal | ICD-10-CM

## 2016-07-26 DIAGNOSIS — G8929 Other chronic pain: Secondary | ICD-10-CM

## 2016-07-26 DIAGNOSIS — M79661 Pain in right lower leg: Secondary | ICD-10-CM

## 2016-07-26 NOTE — Discharge Summary (Signed)
Patient ID: Jackie Parker MRN: [id number] DOB/AGE: 17-Sep-1940 75 y.o.  Admit date: 06/27/2016 Discharge date: 07/26/2016  Admission Diagnoses:  Active Problems:   Hypocalcemia   Acute renal failure (HCC)   Osteoarthritis of left knee   Primary localized osteoarthritis of right knee   Leukocytosis   Anemia   Fever   Acute blood loss anemia   AKI (acute kidney injury) [facility])   Discharge Diagnoses:  Active Problems:   Hypocalcemia   Acute renal failure (HCC)   Osteoarthritis of left knee   Primary localized osteoarthritis of right knee   Leukocytosis   Anemia   Fever   Acute blood loss anemia   AKI (acute kidney injury) ([place])  status post Procedure(s): RIGHT TOTAL KNEE ARTHROPLASTY  Past Medical History:  Diagnosis Date  . AKI (acute kidney injury) ([place]) 12/2015  . Allergy    takes Singulair and Zyrtec daily  . Asthma    uses Adair daily  . Cataracts, bilateral   . Colitis, ulcerative ([place])   . DJD (degenerative joint disease)   . Glaucoma    uses eye drops daily  . History of blood transfusion    no abnormal reaction noted. 40+ yrs ago  . History of bronchitis    yrs ago  . History of gout    not on any meds  . Hypertension    takes Amlodipine daily  . Joint pain   . Joint swelling   . Psoriasis   . Urinary frequency    takes Ditropan daily  . Vertigo    doesn't take any meds  . Vitamin D deficiency     Surgeries: Procedure(s): RIGHT TOTAL KNEE ARTHROPLASTY on 06/27/2016   Consultants: Treatment Team:  Kelvin Cellar, MD  Discharged Condition: Improved  Hospital Course: Jackie Parker is an 75 y.o. female who was admitted 06/27/2016 for operative treatment of end stage DJD knee.. Patient failed conservative treatments (please see the history and physical for the specifics) and had severe unremitting pain that affects sleep, daily activities and work/hobbies. After pre-op clearance, the patient was taken to the operating room on 06/27/2016  and underwent  Procedure(s): RIGHT TOTAL KNEE ARTHROPLASTY.    Patient was given perioperative antibiotics:  Anti-infectives    Start     Dose/Rate Route Frequency Ordered Stop   07/01/16 0000  cephALEXin (KEFLEX) 500 MG capsule     500 mg Oral 2 times daily 07/01/16 1337     06/30/16 1545  cephALEXin (KEFLEX) capsule 500 mg  Status:  Discontinued     500 mg Oral Every 12 hours 06/30/16 1532 07/01/16 1948   06/27/16 0524  ceFAZolin (ANCEF) IVPB 2g/100 mL premix     2 g 200 mL/hr over 30 Minutes Intravenous On call to O.R. 06/27/16 6811 06/27/16 0754       Patient was given sequential compression devices and early ambulation to prevent DVT.   Patient benefited maximally from hospital stay and there were no complications. At the time of discharge, the patient was urinating/moving their bowels without difficulty, tolerating a regular diet, pain is controlled with oral pain medications and they have been cleared by PT/OT.   Recent vital signs: No data found.    Recent laboratory studies: No results for input(s): WBC, HGB, HCT, PLT, NA, K, CL, CO2, BUN, CREATININE, GLUCOSE, INR, CALCIUM in the last 72 hours.  Invalid input(s): PT, 2   Discharge Medications:     Medication List    STOP taking these medications  aspirin EC 81 MG tablet     TAKE these medications   amLODipine 5 MG tablet Commonly known as:  NORVASC Take 5 mg by mouth daily.   brinzolamide 1 % ophthalmic suspension Commonly known as:  AZOPT Place 1 drop into the right eye 2 (two) times daily.   cephALEXin 500 MG capsule Commonly known as:  KEFLEX Take 1 capsule (500 mg total) by mouth 2 (two) times daily.   cetirizine 10 MG tablet Commonly known as:  ZYRTEC Take 10 mg by mouth daily.   cholecalciferol 1000 units tablet Commonly known as:  VITAMIN D Take 1,000 Units by mouth daily.   Fluticasone-Salmeterol 250-50 MCG/DOSE Aepb Commonly known as:  ADVAIR Inhale 1 puff into the lungs every 12 (twelve)  hours.   gabapentin 100 MG capsule Commonly known as:  NEURONTIN Take 100 mg by mouth daily as needed. For back pain.   LUMIGAN 0.01 % Soln Generic drug:  bimatoprost Place 1 drop into both eyes at bedtime.   methocarbamol 500 MG tablet Commonly known as:  ROBAXIN Take 1 tablet (500 mg total) by mouth every 8 (eight) hours as needed for muscle spasms.   montelukast 10 MG tablet Commonly known as:  SINGULAIR Take 10 mg by mouth daily.   oxybutynin 5 MG tablet Commonly known as:  DITROPAN Take 5 mg by mouth 2 (two) times daily.   prochlorperazine 10 MG tablet Commonly known as:  COMPAZINE Take 10 mg by mouth 2 (two) times daily as needed. For nausea/vomiting.   rivaroxaban 10 MG Tabs tablet Commonly known as:  XARELTO Take 1 tablet (10 mg total) by mouth daily.       Diagnostic Studies: Dg Chest Port 1 View  Result Date: 06/29/2016 CLINICAL DATA:  Preoperative exam for knee replacement. History of asthma and hypertension. No present complaints. EXAM: PORTABLE CHEST 1 VIEW COMPARISON:  None. FINDINGS: The heart size and mediastinal contours are within normal limits. Both lungs are clear. Mild chronic pleural blunting in the left lateral costophrenic angle. The visualized skeletal structures are unremarkable. IMPRESSION: No active disease. Electronically Signed   By: Nelson Chimes M.D.   On: 06/29/2016 13:35   Xr Knee 1-2 Views Right  Result Date: 07/13/2016 Right knee lateral radiograph and AP standing of both knees. These radiographs demonstrate bilateral total knee replacements, right leg with significant swelling of the thigh and calf compared with the left. The right knee radiograph shows min lucency at the implant cement interval over the posterior tibial component but the AP and keel are normal, lateral radiograph of P-F joint with patella implant and femoral implant well seated, No acute abnormalities noted post bilateral total knee replace.      Follow-up Information     Jessy Oto, MD In 2 weeks.   Specialty:  Orthopedic Surgery Why:  For wound re-check Contact information: [place] [address] [phone]           Discharge Plan:  discharge to home  Disposition:     Signed: Benjiman Core  07/26/2016, 10:57 AM

## 2016-07-26 NOTE — Telephone Encounter (Signed)
Patient called needing a refill on Rx (oxycodone)  Patient advised she has been waiting for Dr Louanne Skye to approve her Taking a blood thinner. Patient said it need prior approval.  The number to contact patient is [phone]

## 2016-07-27 ENCOUNTER — Ambulatory Visit: Payer: Medicare Other | Admitting: Physical Therapy

## 2016-07-27 DIAGNOSIS — R262 Difficulty in walking, not elsewhere classified: Secondary | ICD-10-CM

## 2016-07-27 DIAGNOSIS — M25561 Pain in right knee: Secondary | ICD-10-CM | POA: Diagnosis not present

## 2016-07-27 DIAGNOSIS — M25661 Stiffness of right knee, not elsewhere classified: Secondary | ICD-10-CM

## 2016-07-27 MED ORDER — OXYCODONE-ACETAMINOPHEN 5-325 MG PO TABS: 1.0000 | ORAL_TABLET | ORAL | 0 refills | Status: DC | PRN

## 2016-07-27 NOTE — Addendum Note (Signed)
Addended by: Basil Dess on: 07/27/2016 02:32 PM   Modules accepted: Orders

## 2016-07-27 NOTE — Therapy (Signed)
[place], [place], [address] Phone: [phone]   Fax:  [phone]  Physical Therapy Treatment  Patient Details  Name: Jackie Parker MRN: [id number] Date of Birth: 18-Jan-1941 Referring Provider: Jessy Oto, MD  Encounter Date: 07/27/2016      PT End of Session - 07/27/16 1551    Visit Number 2   Number of Visits 9   Date for PT Re-Evaluation 08/22/16   PT Start Time 1504   PT Stop Time 1603   PT Time Calculation (min) 59 min   Activity Tolerance Patient tolerated treatment well   Behavior During Therapy [facility] for tasks assessed/performed      Past Medical History:  Diagnosis Date  . AKI (acute kidney injury) ([place]) 12/2015  . Allergy    takes Singulair and Zyrtec daily  . Asthma    uses Adair daily  . Cataracts, bilateral   . Colitis, ulcerative ([place])   . DJD (degenerative joint disease)   . Glaucoma    uses eye drops daily  . History of blood transfusion    no abnormal reaction noted. 40+ yrs ago  . History of bronchitis    yrs ago  . History of gout    not on any meds  . Hypertension    takes Amlodipine daily  . Joint pain   . Joint swelling   . Psoriasis   . Urinary frequency    takes Ditropan daily  . Vertigo    doesn't take any meds  . Vitamin D deficiency     Past Surgical History:  Procedure Laterality Date  . BACK SURGERY    . COLONOSCOPY    . ILEOSTOMY    . KNEE ARTHROPLASTY Left 12/14/2015   Procedure: COMPUTER ASSISTED LEFT TOTAL KNEE ARTHROPLASTY;  Surgeon: Jessy Oto, MD;  Location: [place];  Service: Orthopedics;  Laterality: Left;  . TOTAL KNEE ARTHROPLASTY Left 12/14/2015  . TOTAL KNEE ARTHROPLASTY Right 06/27/2016  . TOTAL KNEE ARTHROPLASTY Right 06/27/2016   Procedure: RIGHT TOTAL KNEE ARTHROPLASTY;  Surgeon: Jessy Oto, MD;  Location: [place];  Service: Orthopedics;  Laterality: Right;  . TUBAL LIGATION      There were no vitals filed for this  visit.      Subjective Assessment - 07/27/16 1508    Subjective I don't have any pain.  I have been able to sleep a little better. I have been doing my exercises.   Currently in Pain? No/denies   Pain Orientation Right;Anterior  shoots down shin deep to the bone   Pain Frequency Intermittent            [facility] PT Assessment - 07/27/16 0001      PROM   Right Knee Extension --  2 fingers off mat   Right Knee Flexion 116                     [facility] Adult PT Treatment/Exercise - 07/27/16 0001      Knee/Hip Exercises: Stretches   Passive Hamstring Stretch Limitations 2 X 30 seconds     Knee/Hip Exercises: Aerobic   Nustep 5 minutes L5, legs only     Knee/Hip Exercises: Standing   Other Standing Knee Exercises facing wall weight shift to right leg 5 X with arm slide up wall     Knee/Hip Exercises: Seated   Long Arc Quad 10 reps  2 sets, 1 set with 5 ankle pumps  Knee/Hip Exercises: Supine   Short Arc Quad Sets 3 sets;10 reps   Short Arc Quad Sets Limitations 0,3,5 Lbs each   Terminal Knee Extension Limitations 10 x 5 seconds ball press at wall   Straight Leg Raises 10 reps;2 sets  from bolster   Other Supine Knee/Hip Exercises ankle pumps with leg elevated.   Other Supine Knee/Hip Exercises resisted hamstring curl over bloster and foam roller 10 X manually      Vasopneumatic   Number Minutes Vasopneumatic  15 minutes   Vasopnuematic Location  Knee   Vasopneumatic Pressure Low   Vasopneumatic Temperature  lowest     Manual Therapy   Manual therapy comments massage for edema and venus stasis                  PT Short Term Goals - 07/25/16 1156      PT SHORT TERM GOAL #1   Title Pt will demo 0-120 deg knee ROM, indicating necessary ROM for functional daily activities by 12/15   Baseline see flowsheet   Time 4   Period Weeks   Status New     PT SHORT TERM GOAL #2   Title Gross MMT to 5/5 to indicate proper support to knee joint during  daily activities   Baseline see flowsheet   Time 4   Period Weeks   Status New     PT SHORT TERM GOAL #3   Title FOTO to 62% to indicate significant functional improvement.    Baseline  53% at eval   Time 4   Period Weeks   Status New     PT SHORT TERM GOAL #4   Title Pt will demo SLS for at least 5 sec without UE support to indicate proper balance for safety following TKA   Baseline unable at eval   Time 4   Period Weeks   Status New     PT SHORT TERM GOAL #5   Title Pt will demo ability to climb stairs step over step with minimal UE assist to improve community ambulation ability   Baseline heavy use of UE at eval   Time 4   Period Weeks   Status New                  Plan - 07/27/16 1551    Clinical Impression Statement Patient able to get 106 degrees AA knee flexion with extension within 2 fingers width from mat.  No pain at the end of session.  Many of exercises were supine with leg elevated to assist with venus stasis. .  [place] toward ROM goals.   PT Next Visit Plan bike, extension ROM, DF stretch, exercises with leg elevated ,  More weight shifting    PT Home Exercise Plan gastroc stretch, hamstring stretch, LAQ with ankle pumps, elevate leg, heel toe gait.    Consulted and Agree with Plan of Care Patient      Patient will benefit from skilled therapeutic intervention in order to improve the following deficits and impairments:  Abnormal gait, Decreased range of motion, Difficulty walking, Decreased activity tolerance, Pain, Decreased strength, Increased edema  Visit Diagnosis: Right knee pain, unspecified chronicity  Difficulty in walking, not elsewhere classified  Stiffness of right knee, not elsewhere classified     Problem List Patient Active Problem List   Diagnosis Date Noted  . Leukocytosis 06/29/2016  . Anemia 06/29/2016  . Fever   . Acute blood loss anemia   . AKI (  acute kidney injury) ([place])   . Primary localized osteoarthritis of  right knee 06/27/2016  . Osteoarthritis of left knee 12/14/2015    Class: End Stage  . Osteoarthritis of right knee 12/14/2015  . Small bowel obstruction, partial 08/30/2013  . Acute renal failure ([place]) 08/30/2013  . 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 N&V (nausea and vomiting) 08/30/2013  . Abdominal pain, acute 08/30/2013  . SBO (small bowel obstruction) 08/30/2013  . Hypokalemia 08/30/2013  . Partial small bowel obstruction 08/30/2013  . Postmenopausal bleeding 03/18/2013  . OSTEOARTHRITIS, KNEE, LEFT 03/20/2010  . TRICUSPID REGURGITATION, MILD 03/03/2010  . RENAL INSUFFICIENCY 03/02/2010  . MORBID OBESITY 02/18/2010  . PREMATURE ATRIAL CONTRACTIONS 02/18/2010  . PREMATURE VENTRICULAR CONTRACTIONS 02/18/2010  . CARDIAC MURMUR 02/18/2010  . EDEMA 10/20/2009  . HYPOKALEMIA 07/30/2009  . RIB PAIN, RIGHT SIDED 05/26/2008  . ABDOMINAL PAIN, LEFT LOWER QUADRANT 12/31/2007  . ALLERGIC RHINITIS 06/25/2007  . ABNORMAL BLOOD CHEMISTRY , OTHER 06/25/2007  . UNSPECIFIED VITAMIN D DEFICIENCY 04/30/2007  . Hypocalcemia 04/19/2007  . GLAUCOMA NEC 04/19/2007  . HYPERTENSION, ESSENTIAL NOS 04/19/2007  . ASTHMA 04/19/2007  . COLITIS, ULCERATIVE NOS 04/19/2007  . DISORDER, MENSTRUAL NEC 04/19/2007  . PSORIASIS 04/19/2007  . 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 GOUT NOS 02/21/2006    Dorethea Strubel PTA 07/27/2016, 3:56 PM  [facility] [address] [place], [place], [address] Phone: [phone]   Fax:  [phone]  Name: Miyana Mordecai MRN: [id number] Date of Birth: 08/26/1941

## 2016-07-27 NOTE — Telephone Encounter (Signed)
Called patient and left vm advised rx for pain meds ready to be picked up at our office, Xarelto discontinue

## 2016-07-27 NOTE — Telephone Encounter (Signed)
Please advise on refill for oxycodone?

## 2016-08-01 ENCOUNTER — Ambulatory Visit: Payer: Medicare Other | Admitting: Physical Therapy

## 2016-08-01 DIAGNOSIS — R262 Difficulty in walking, not elsewhere classified: Secondary | ICD-10-CM

## 2016-08-01 DIAGNOSIS — M25561 Pain in right knee: Secondary | ICD-10-CM

## 2016-08-01 DIAGNOSIS — M25661 Stiffness of right knee, not elsewhere classified: Secondary | ICD-10-CM

## 2016-08-01 NOTE — Therapy (Signed)
[place], [place], [address] Phone: [phone]   Fax:  [phone]  Physical Therapy Treatment  Patient Details  Name: Jackie Parker MRN: [id number] Date of Birth: 1941/03/02 Referring Provider: Jessy Oto, MD  Encounter Date: 08/01/2016      PT End of Session - 08/01/16 1044    Visit Number 3   Number of Visits 9   Date for PT Re-Evaluation 08/22/16   Authorization Type UHC MCR-KX beginning first visit, therapy earlier in the year   PT Start Time 1017   PT Stop Time 1100   PT Time Calculation (min) 43 min      Past Medical History:  Diagnosis Date  . AKI (acute kidney injury) ([place]) 12/2015  . Allergy    takes Singulair and Zyrtec daily  . Asthma    uses Adair daily  . Cataracts, bilateral   . Colitis, ulcerative ([place])   . DJD (degenerative joint disease)   . Glaucoma    uses eye drops daily  . History of blood transfusion    no abnormal reaction noted. 40+ yrs ago  . History of bronchitis    yrs ago  . History of gout    not on any meds  . Hypertension    takes Amlodipine daily  . Joint pain   . Joint swelling   . Psoriasis   . Urinary frequency    takes Ditropan daily  . Vertigo    doesn't take any meds  . Vitamin D deficiency     Past Surgical History:  Procedure Laterality Date  . BACK SURGERY    . COLONOSCOPY    . ILEOSTOMY    . KNEE ARTHROPLASTY Left 12/14/2015   Procedure: COMPUTER ASSISTED LEFT TOTAL KNEE ARTHROPLASTY;  Surgeon: Jessy Oto, MD;  Location: [facility];  Service: Orthopedics;  Laterality: Left;  . TOTAL KNEE ARTHROPLASTY Left 12/14/2015  . TOTAL KNEE ARTHROPLASTY Right 06/27/2016  . TOTAL KNEE ARTHROPLASTY Right 06/27/2016   Procedure: RIGHT TOTAL KNEE ARTHROPLASTY;  Surgeon: Jessy Oto, MD;  Location: [facility];  Service: Orthopedics;  Laterality: Right;  . TUBAL LIGATION      There were no vitals filed for this visit.      Subjective Assessment  - 08/01/16 1119    Subjective Still only pain in lower leg. No knee pain                         [facility] Adult PT Treatment/Exercise - 08/01/16 0001      Knee/Hip Exercises: Stretches   Gastroc Stretch Limitations at wall 3 x 30      Knee/Hip Exercises: Aerobic   Nustep 5 minutes L5, legs only     Knee/Hip Exercises: Standing   Other Standing Knee Exercises facing wall weight shift to right leg 10 X with arm slide up wall     Knee/Hip Exercises: Seated   Long Arc Quad 10 reps;2 sets   [employer] Weight 5 lbs.     Knee/Hip Exercises: Supine   Short Arc Quad Sets 3 sets;10 reps   Short Arc Quad Sets Limitations 5   Heel Slides 10 reps   Straight Leg Raises 10 reps;2 sets   Straight Leg Raises Limitations cues for terminal knee extension     Knee/Hip Exercises: Sidelying   Hip ABduction 2 sets;10 reps     Manual Therapy   Manual Therapy Joint mobilization  Joint Mobilization ankle mobs supine A/P followed by PROM DF                PT Education - 08/01/16 1119    Education provided Yes   Education Details HEP   Person(s) Educated Patient   Methods Explanation   Comprehension Verbalized understanding          PT Short Term Goals - 07/25/16 1156      PT SHORT TERM GOAL #1   Title Pt will demo 0-120 deg knee ROM, indicating necessary ROM for functional daily activities by 12/15   Baseline see flowsheet   Time 4   Period Weeks   Status New     PT SHORT TERM GOAL #2   Title Gross MMT to 5/5 to indicate proper support to knee joint during daily activities   Baseline see flowsheet   Time 4   Period Weeks   Status New     PT SHORT TERM GOAL #3   Title FOTO to 62% to indicate significant functional improvement.    Baseline  53% at eval   Time 4   Period Weeks   Status New     PT SHORT TERM GOAL #4   Title Pt will demo SLS for at least 5 sec without UE support to indicate proper balance for safety following TKA   Baseline unable at  eval   Time 4   Period Weeks   Status New     PT SHORT TERM GOAL #5   Title Pt will demo ability to climb stairs step over step with minimal UE assist to improve community ambulation ability   Baseline heavy use of UE at eval   Time 4   Period Weeks   Status New                  Plan - 08/01/16 1115    Clinical Impression Statement Pt reports edema massage may have helped a little. Right ankle lacks DF, during calf stretch pt feels anterior ankle pain. Ankle mobs performed to loosen. Continued quad strength and began lateral hip strength. Added hip abduction to HEP. Pt declined Ice at end of treatment.    PT Next Visit Plan bike, extension ROM, DF stretch-try more right ankle mobs to increase DF, exercises with leg elevated ,  More weight shifting    PT Home Exercise Plan gastroc stretch, hamstring stretch, LAQ with ankle pumps, elevate leg, heel toe gait. hip abduction sidelying      Patient will benefit from skilled therapeutic intervention in order to improve the following deficits and impairments:  Abnormal gait, Decreased range of motion, Difficulty walking, Decreased activity tolerance, Pain, Decreased strength, Increased edema  Visit Diagnosis: Right knee pain, unspecified chronicity  Difficulty in walking, not elsewhere classified  Stiffness of right knee, not elsewhere classified     Problem List Patient Active Problem List   Diagnosis Date Noted  . Leukocytosis 06/29/2016  . Anemia 06/29/2016  . Fever   . Acute blood loss anemia   . AKI (acute kidney injury) ([facility])   . Primary localized osteoarthritis of right knee 06/27/2016  . Osteoarthritis of left knee 12/14/2015    Class: End Stage  . Osteoarthritis of right knee 12/14/2015  . Small bowel obstruction, partial 08/30/2013  . Acute renal failure ([facility]) 08/30/2013  . 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 N&V (nausea and vomiting) 08/30/2013  . Abdominal pain, acute 08/30/2013  . SBO (small  bowel  obstruction) 08/30/2013  . Hypokalemia 08/30/2013  . Partial small bowel obstruction 08/30/2013  . Postmenopausal bleeding 03/18/2013  . OSTEOARTHRITIS, KNEE, LEFT 03/20/2010  . TRICUSPID REGURGITATION, MILD 03/03/2010  . RENAL INSUFFICIENCY 03/02/2010  . MORBID OBESITY 02/18/2010  . PREMATURE ATRIAL CONTRACTIONS 02/18/2010  . PREMATURE VENTRICULAR CONTRACTIONS 02/18/2010  . CARDIAC MURMUR 02/18/2010  . EDEMA 10/20/2009  . HYPOKALEMIA 07/30/2009  . RIB PAIN, RIGHT SIDED 05/26/2008  . ABDOMINAL PAIN, LEFT LOWER QUADRANT 12/31/2007  . ALLERGIC RHINITIS 06/25/2007  . ABNORMAL BLOOD CHEMISTRY , OTHER 06/25/2007  . UNSPECIFIED VITAMIN D DEFICIENCY 04/30/2007  . Hypocalcemia 04/19/2007  . GLAUCOMA NEC 04/19/2007  . HYPERTENSION, ESSENTIAL NOS 04/19/2007  . ASTHMA 04/19/2007  . COLITIS, ULCERATIVE NOS 04/19/2007  . DISORDER, MENSTRUAL NEC 04/19/2007  . PSORIASIS 04/19/2007  . 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 GOUT NOS 02/21/2006    Dorene Ar, PTA 08/01/2016, 11:27 AM  [place], [place], [address] Phone: [phone]   Fax:  [phone]  Name: Karn Derk MRN: [id number] Date of Birth: 1940-12-03

## 2016-08-01 NOTE — Patient Instructions (Signed)
Lie on side with bottom leg bent and top leg straight. Raise top leg toward ceiling. Perform 10 repetitions, 2 sets, 2 times per day.

## 2016-08-03 ENCOUNTER — Ambulatory Visit: Payer: Medicare Other | Admitting: Physical Therapy

## 2016-08-03 DIAGNOSIS — M25561 Pain in right knee: Secondary | ICD-10-CM | POA: Diagnosis not present

## 2016-08-03 DIAGNOSIS — M6281 Muscle weakness (generalized): Secondary | ICD-10-CM

## 2016-08-03 DIAGNOSIS — M25661 Stiffness of right knee, not elsewhere classified: Secondary | ICD-10-CM

## 2016-08-03 DIAGNOSIS — R262 Difficulty in walking, not elsewhere classified: Secondary | ICD-10-CM

## 2016-08-03 NOTE — Therapy (Signed)
[place], [place], [address] Phone: [phone]   Fax:  [phone]  Physical Therapy Treatment  Patient Details  Name: Jackie Parker MRN: [id number] Date of Birth: 1941/02/23 Referring Provider: Jessy Oto, MD  Encounter Date: 08/03/2016      PT End of Session - 08/03/16 0935    Visit Number 4   Number of Visits 9   Date for PT Re-Evaluation 08/22/16   Authorization Type UHC MCR-KX beginning first visit, therapy earlier in the year   PT Start Time 0932   PT Stop Time 1015   PT Time Calculation (min) 43 min      Past Medical History:  Diagnosis Date  . AKI (acute kidney injury) ([facility]) 12/2015  . Allergy    takes Singulair and Zyrtec daily  . Asthma    uses Adair daily  . Cataracts, bilateral   . Colitis, ulcerative ([place] [facility])   . DJD (degenerative joint disease)   . Glaucoma    uses eye drops daily  . History of blood transfusion    no abnormal reaction noted. 40+ yrs ago  . History of bronchitis    yrs ago  . History of gout    not on any meds  . Hypertension    takes Amlodipine daily  . Joint pain   . Joint swelling   . Psoriasis   . Urinary frequency    takes Ditropan daily  . Vertigo    doesn't take any meds  . Vitamin D deficiency     Past Surgical History:  Procedure Laterality Date  . BACK SURGERY    . COLONOSCOPY    . ILEOSTOMY    . KNEE ARTHROPLASTY Left 12/14/2015   Procedure: COMPUTER ASSISTED LEFT TOTAL KNEE ARTHROPLASTY;  Surgeon: Jessy Oto, MD;  Location: [facility];  Service: Orthopedics;  Laterality: Left;  . TOTAL KNEE ARTHROPLASTY Left 12/14/2015  . TOTAL KNEE ARTHROPLASTY Right 06/27/2016  . TOTAL KNEE ARTHROPLASTY Right 06/27/2016   Procedure: RIGHT TOTAL KNEE ARTHROPLASTY;  Surgeon: Jessy Oto, MD;  Location: [facility];  Service: Orthopedics;  Laterality: Right;  . TUBAL LIGATION      There were no vitals filed for this visit.      Subjective Assessment  - 08/03/16 0935    Subjective Doing a lot better today. Had a sharp pain run down leg this morning whil in bed.    Currently in Pain? Yes   Pain Score 4    Pain Descriptors / Indicators Sore   Pain Onset 1 to 4 weeks ago   Aggravating Factors  come and goes for no reason   Pain Relieving Factors pain meds,                          [facility] Adult PT Treatment/Exercise - 08/03/16 0001      Knee/Hip Exercises: Stretches   Other Knee/Hip Stretches slant board stretch 2 x 30 seconds      Knee/Hip Exercises: Aerobic   Nustep L4 x 7 minutes LE only      Knee/Hip Exercises: Standing   Heel Raises 10 reps   Heel Raises Limitations toe raises x 10    Forward Step Up 10 reps;Hand Hold: 1;Step Height: 6"   Wall Squat 10 reps   Stairs ascend/ descend alternating patter 6 inch, 8 step one hard rail , no increased pain    Gait Training staggered stand roll through  focusing heel toe, retro stepping for quad activation,   Other Standing Knee Exercises facing wall weight shift to right leg 10 X with arm slide up wall     Knee/Hip Exercises: Seated   Long Arc Quad 20 reps;Weights   Long Arc Quad Weight 5 lbs.   Marching Limitations x 10 5#       Knee/Hip Exercises: Supine   Short Arc Quad Sets 3 sets;10 reps   Short Arc Quad Sets Limitations 5   Terminal Knee Extension 10 reps;2 sets   Terminal Knee Extension Limitations supine with heel prop and towel roll    Straight Leg Raises 10 reps;2 sets   Straight Leg Raises Limitations cues for terminal knee extension     Knee/Hip Exercises: Sidelying   Hip ABduction 2 sets;10 reps  both     Manual Therapy   Manual Therapy Joint mobilization   Joint Mobilization ankle mobs supine A/P followed by PROM DF, lateral mobs to increased inversion and eversion                   PT Short Term Goals - 08/03/16 1042      PT SHORT TERM GOAL #1   Title Pt will demo 0-120 deg knee ROM, indicating necessary ROM for functional daily  activities by 12/15   Time 4   Period Weeks   Status Unable to assess     PT SHORT TERM GOAL #2   Title Gross MMT to 5/5 to indicate proper support to knee joint during daily activities   Period Weeks   Status Unable to assess     PT SHORT TERM GOAL #3   Title FOTO to 62% to indicate significant functional improvement.    Time 4   Period Weeks   Status Unable to assess     PT SHORT TERM GOAL #4   Title Pt will demo SLS for at least 5 sec without UE support to indicate proper balance for safety following TKA   Time 4   Period Weeks   Status Unable to assess     PT SHORT TERM GOAL #5   Title Pt will demo ability to climb stairs step over step with minimal UE assist to improve community ambulation ability   Baseline 1 HR    Time 4   Period Weeks   Status Achieved                  Plan - 08/03/16 1002    Clinical Impression Statement Pt reports lower leg is feeling better today. It ia not as sore and achey as it was last visit. She deomstrates increased DF ROM compared to last visit. Perfromed lateral mobs to improve eversion and inversion Focused strengtening today with good tolerance. Able to climb negotiate reciprocal stairs with 1 HR safely. LTG# 5 Met.    PT Next Visit Plan bike, extension ROM, DF stretch-try more right ankle mobs to increase DF, exercises with leg elevated ,  More weight shifting    PT Home Exercise Plan gastroc stretch, hamstring stretch, LAQ with ankle pumps, elevate leg, heel toe gait. hip abduction sidelying; add more step exercises, add  SLS; Check goals     Consulted and Agree with Plan of Care Patient      Patient will benefit from skilled therapeutic intervention in order to improve the following deficits and impairments:  Abnormal gait, Decreased range of motion, Difficulty walking, Decreased activity tolerance, Pain, Decreased strength, Increased edema  Visit Diagnosis: Right knee pain, unspecified chronicity  Difficulty in walking,  not elsewhere classified  Stiffness of right knee, not elsewhere classified  Muscle weakness (generalized)     Problem List Patient Active Problem List   Diagnosis Date Noted  . Leukocytosis 06/29/2016  . Anemia 06/29/2016  . Fever   . Acute blood loss anemia   . AKI (acute kidney injury) ([facility])   . Primary localized osteoarthritis of right knee 06/27/2016  . Osteoarthritis of left knee 12/14/2015    Class: End Stage  . Osteoarthritis of right knee 12/14/2015  . Small bowel obstruction, partial 08/30/2013  . Acute renal failure ([facility]) 08/30/2013  . 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 N&V (nausea and vomiting) 08/30/2013  . Abdominal pain, acute 08/30/2013  . SBO (small bowel obstruction) 08/30/2013  . Hypokalemia 08/30/2013  . Partial small bowel obstruction 08/30/2013  . Postmenopausal bleeding 03/18/2013  . OSTEOARTHRITIS, KNEE, LEFT 03/20/2010  . TRICUSPID REGURGITATION, MILD 03/03/2010  . RENAL INSUFFICIENCY 03/02/2010  . MORBID OBESITY 02/18/2010  . PREMATURE ATRIAL CONTRACTIONS 02/18/2010  . PREMATURE VENTRICULAR CONTRACTIONS 02/18/2010  . CARDIAC MURMUR 02/18/2010  . EDEMA 10/20/2009  . HYPOKALEMIA 07/30/2009  . RIB PAIN, RIGHT SIDED 05/26/2008  . ABDOMINAL PAIN, LEFT LOWER QUADRANT 12/31/2007  . ALLERGIC RHINITIS 06/25/2007  . ABNORMAL BLOOD CHEMISTRY , OTHER 06/25/2007  . UNSPECIFIED VITAMIN D DEFICIENCY 04/30/2007  . Hypocalcemia 04/19/2007  . GLAUCOMA NEC 04/19/2007  . HYPERTENSION, ESSENTIAL NOS 04/19/2007  . ASTHMA 04/19/2007  . COLITIS, ULCERATIVE NOS 04/19/2007  . DISORDER, MENSTRUAL NEC 04/19/2007  . PSORIASIS 04/19/2007  . 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 GOUT NOS 02/21/2006    Dorene Ar, PTA 08/03/2016, 10:46 AM  [facility] [address] [place], [place], [address] Phone: [phone]   Fax:  [phone]  Name: Amaura Authier MRN: [id number] Date of Birth:  June 26, 1941

## 2016-08-10 ENCOUNTER — Other Ambulatory Visit (INDEPENDENT_AMBULATORY_CARE_PROVIDER_SITE_OTHER): Payer: Self-pay | Admitting: Specialist

## 2016-08-10 ENCOUNTER — Ambulatory Visit: Payer: Medicare Other | Admitting: Physical Therapy

## 2016-08-10 NOTE — Telephone Encounter (Signed)
Please advise on rx.

## 2016-08-12 ENCOUNTER — Ambulatory Visit: Payer: Medicare Other | Admitting: Physical Therapy

## 2016-08-12 ENCOUNTER — Telehealth (INDEPENDENT_AMBULATORY_CARE_PROVIDER_SITE_OTHER): Payer: Self-pay | Admitting: Specialist

## 2016-08-12 NOTE — Telephone Encounter (Signed)
Pt called and said she had a right knee replacement and has been going to outpatient therapy. She had to miss one day of therapy because now when she stands up her knee will not move. Please give pt a call.

## 2016-08-12 NOTE — Telephone Encounter (Signed)
See message below. I called patient and spoke with her she states she is still able to move her knee just having a lot of pain standing. Did not go to her last two physical therapy visits due to the pain. I advised I would send you a message and see what you suggest. Patient is also requesting a refill on her pain medication.  Thanks. Nira Conn

## 2016-08-12 NOTE — Telephone Encounter (Signed)
Message left on her answering machine advising her to be sure to go to therapy as much as she can to giver her the best chance of having a good result, she had the other TKR earlier this year so she knows that therapy is as important and takes longer than surgery, it takes nearly 4 months to get over the replacement but therapy important to ensure a good result with very good ROM. Jackie Parker

## 2016-08-15 ENCOUNTER — Telehealth (INDEPENDENT_AMBULATORY_CARE_PROVIDER_SITE_OTHER): Payer: Self-pay | Admitting: Specialist

## 2016-08-15 NOTE — Telephone Encounter (Signed)
Called the pharmacy to see if they received the 08/10/16 rx and they did not. So gave over phone verbally. Patient aware Thanks.

## 2016-08-15 NOTE — Telephone Encounter (Signed)
Pt stated she  hasn't heard from [place] and that she needs something for pain and for her leg. Please call her at [phone]

## 2016-08-17 ENCOUNTER — Encounter: Payer: Self-pay | Admitting: Physical Therapy

## 2016-08-17 ENCOUNTER — Ambulatory Visit: Payer: Medicare Other | Attending: Specialist | Admitting: Physical Therapy

## 2016-08-17 DIAGNOSIS — M6281 Muscle weakness (generalized): Secondary | ICD-10-CM | POA: Diagnosis present

## 2016-08-17 DIAGNOSIS — M25661 Stiffness of right knee, not elsewhere classified: Secondary | ICD-10-CM | POA: Diagnosis present

## 2016-08-17 DIAGNOSIS — M25561 Pain in right knee: Secondary | ICD-10-CM | POA: Insufficient documentation

## 2016-08-17 DIAGNOSIS — R262 Difficulty in walking, not elsewhere classified: Secondary | ICD-10-CM | POA: Insufficient documentation

## 2016-08-17 NOTE — Therapy (Signed)
[place], [place], [address] Phone: [phone]   Fax:  [phone]  Physical Therapy Treatment  Patient Details  Name: Jackie Parker MRN: [id number] Date of Birth: [birth year] Referring Provider: Jessy Oto, MD  Encounter Date: 08/17/2016      PT End of Session - 08/17/16 1017    Visit Number 5   Number of Visits 9   Date for PT Re-Evaluation 08/22/16   Authorization Type UHC MCR-KX beginning first visit, therapy earlier in the year   PT Start Time 1017   PT Stop Time 1100   PT Time Calculation (min) 43 min   Activity Tolerance Patient tolerated treatment well   Behavior During Therapy [facility] for tasks assessed/performed      Past Medical History:  Diagnosis Date  . AKI (acute kidney injury) ([facility]) 12/2015  . Allergy    takes Singulair and Zyrtec daily  . Asthma    uses Adair daily  . Cataracts, bilateral   . Colitis, ulcerative ([facility])   . DJD (degenerative joint disease)   . Glaucoma    uses eye drops daily  . History of blood transfusion    no abnormal reaction noted. 40+ yrs ago  . History of bronchitis    yrs ago  . History of gout    not on any meds  . Hypertension    takes Amlodipine daily  . Joint pain   . Joint swelling   . Psoriasis   . Urinary frequency    takes Ditropan daily  . Vertigo    doesn't take any meds  . Vitamin D deficiency     Past Surgical History:  Procedure Laterality Date  . BACK SURGERY    . COLONOSCOPY    . ILEOSTOMY    . KNEE ARTHROPLASTY Left 12/14/2015   Procedure: COMPUTER ASSISTED LEFT TOTAL KNEE ARTHROPLASTY;  Surgeon: Jessy Oto, MD;  Location: [facility];  Service: Orthopedics;  Laterality: Left;  . TOTAL KNEE ARTHROPLASTY Left 12/14/2015  . TOTAL KNEE ARTHROPLASTY Right 06/27/2016  . TOTAL KNEE ARTHROPLASTY Right 06/27/2016   Procedure: RIGHT TOTAL KNEE ARTHROPLASTY;  Surgeon: Jessy Oto, MD;  Location: [facility];  Service: Orthopedics;   Laterality: Right;  . TUBAL LIGATION      There were no vitals filed for this visit.      Subjective Assessment - 08/17/16 1017    Subjective Pt reports not coming to PT last week bc her leg would not let her move, like there was a knot that would not let go. Reports she was able to rub it to make it feel a little better. When rubbing area of pain she was rubbing proximal portion of anterior tibialis.    Patient Stated Goals walk, shop, sleep   Currently in Pain? Yes   Pain Score 5    Pain Location Knee   Pain Orientation Right;Anterior   Pain Descriptors / Indicators Aching;Throbbing   Aggravating Factors  unknown   Pain Relieving Factors medication, massage            [facility] PT Assessment - 08/17/16 0001      Precautions   Precaution Comments opening noted in R incision beg 3cm from inferior point of incision; 1/2cm 12-6, 1/4 cm 3-9; no tunneling or oozing evident     PROM   Right Knee Extension -5  [facility] Adult PT Treatment/Exercise - 08/17/16 0001      Knee/Hip Exercises: Stretches   Passive Hamstring Stretch Limitations seated EOB green strap     Knee/Hip Exercises: Aerobic   Nustep L4 5 min     Knee/Hip Exercises: Standing   Other Standing Knee Exercises in parallel bars: retro shift with quad set to fwd shift; airex mini squats; heel/toe raises     Knee/Hip Exercises: Supine   Straight Leg Raises 15 reps   Straight Leg Raises Limitations cues to avoid quad lag   Other Supine Knee/Hip Exercises ankle pumps for ant tib and post knee stretch     Knee/Hip Exercises: Sidelying   Clams 20     Manual Therapy   Manual Therapy Soft tissue mobilization   Manual therapy comments cleaned area around wound    Soft tissue mobilization R anterior tibialis                  PT Short Term Goals - 08/03/16 1042      PT SHORT TERM GOAL #1   Title Pt will demo 0-120 deg knee ROM, indicating necessary ROM for functional daily  activities by 12/15   Time 4   Period Weeks   Status Unable to assess     PT SHORT TERM GOAL #2   Title Gross MMT to 5/5 to indicate proper support to knee joint during daily activities   Period Weeks   Status Unable to assess     PT SHORT TERM GOAL #3   Title FOTO to 62% to indicate significant functional improvement.    Time 4   Period Weeks   Status Unable to assess     PT SHORT TERM GOAL #4   Title Pt will demo SLS for at least 5 sec without UE support to indicate proper balance for safety following TKA   Time 4   Period Weeks   Status Unable to assess     PT SHORT TERM GOAL #5   Title Pt will demo ability to climb stairs step over step with minimal UE assist to improve community ambulation ability   Baseline 1 HR    Time 4   Period Weeks   Status Achieved                  Plan - 08/17/16 1100    Clinical Impression Statement Placed bandaid over wound after cleaning area surrounding. Leg pain indicitive of shin splints from DF work with limited knee extension. Did not need to rub/massage to stand. Provided with tennis ball for self soft tissue mobilization at home.    PT Next Visit Plan bike, extension ROM, DF stretch-try more right ankle mobs to increase DF, exercises with leg elevated ,  More weight shifting , check wound   PT Home Exercise Plan gastroc stretch, hamstring stretch, LAQ with ankle pumps, elevate leg, heel toe gait. hip abduction sidelying; add more step exercises, add  SLS; Check goals     Consulted and Agree with Plan of Care Patient      Patient will benefit from skilled therapeutic intervention in order to improve the following deficits and impairments:     Visit Diagnosis: Right knee pain, unspecified chronicity  Difficulty in walking, not elsewhere classified  Stiffness of right knee, not elsewhere classified  Muscle weakness (generalized)     Problem List Patient Active Problem List   Diagnosis Date Noted  . Leukocytosis  06/29/2016  . Anemia 06/29/2016  .  Fever   . Acute blood loss anemia   . AKI (acute kidney injury) ([place])   . Primary localized osteoarthritis of right knee 06/27/2016  . Osteoarthritis of left knee 12/14/2015    Class: End Stage  . Osteoarthritis of right knee 12/14/2015  . Small bowel obstruction, partial 08/30/2013  . Acute renal failure ([place]) 08/30/2013  . 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 N&V (nausea and vomiting) 08/30/2013  . Abdominal pain, acute 08/30/2013  . SBO (small bowel obstruction) 08/30/2013  . Hypokalemia 08/30/2013  . Partial small bowel obstruction 08/30/2013  . Postmenopausal bleeding 03/18/2013  . OSTEOARTHRITIS, KNEE, LEFT 03/20/2010  . TRICUSPID REGURGITATION, MILD 03/03/2010  . RENAL INSUFFICIENCY 03/02/2010  . MORBID OBESITY 02/18/2010  . PREMATURE ATRIAL CONTRACTIONS 02/18/2010  . PREMATURE VENTRICULAR CONTRACTIONS 02/18/2010  . CARDIAC MURMUR 02/18/2010  . EDEMA 10/20/2009  . HYPOKALEMIA 07/30/2009  . RIB PAIN, RIGHT SIDED 05/26/2008  . ABDOMINAL PAIN, LEFT LOWER QUADRANT 12/31/2007  . ALLERGIC RHINITIS 06/25/2007  . ABNORMAL BLOOD CHEMISTRY , OTHER 06/25/2007  . UNSPECIFIED VITAMIN D DEFICIENCY 04/30/2007  . Hypocalcemia 04/19/2007  . GLAUCOMA NEC 04/19/2007  . HYPERTENSION, ESSENTIAL NOS 04/19/2007  . ASTHMA 04/19/2007  . COLITIS, ULCERATIVE NOS 04/19/2007  . DISORDER, MENSTRUAL NEC 04/19/2007  . PSORIASIS 04/19/2007  . 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 GOUT NOS 02/21/2006    Sameria Morss C. Makylie Rivere PT, DPT 08/17/16 11:08 AM   [place], [place], [address] Phone: [phone]   Fax:  [phone]  Name: Jackie Parker MRN: [id number] Date of Birth: Feb 13, 1941

## 2016-08-19 ENCOUNTER — Encounter: Payer: Self-pay | Admitting: Physical Therapy

## 2016-08-19 ENCOUNTER — Ambulatory Visit: Payer: Medicare Other | Admitting: Physical Therapy

## 2016-08-19 DIAGNOSIS — M6281 Muscle weakness (generalized): Secondary | ICD-10-CM

## 2016-08-19 DIAGNOSIS — M25561 Pain in right knee: Secondary | ICD-10-CM

## 2016-08-19 DIAGNOSIS — R262 Difficulty in walking, not elsewhere classified: Secondary | ICD-10-CM

## 2016-08-19 DIAGNOSIS — M25661 Stiffness of right knee, not elsewhere classified: Secondary | ICD-10-CM

## 2016-08-19 NOTE — Therapy (Signed)
[place], [place], [address] Phone: [phone]   Fax:  [phone]  Physical Therapy Treatment/Discharge Summary  Patient Details  Name: Jackie Parker MRN: [id number] Date of Birth: 16-Dec-1940 Referring Provider: Jessy Oto, MD  Encounter Date: 08/19/2016      PT End of Session - 08/19/16 1026    Visit Number 6   Number of Visits 9   Date for PT Re-Evaluation 08/22/16   Authorization Type [employer] MCR-KX beginning first visit, therapy earlier in the year   PT Start Time 1021   PT Stop Time 1100   PT Time Calculation (min) 39 min   Activity Tolerance Patient tolerated treatment well   Behavior During Therapy [facility] for tasks assessed/performed      Past Medical History:  Diagnosis Date  . AKI (acute kidney injury) ([place]) 12/2015  . Allergy    takes Singulair and Zyrtec daily  . Asthma    uses Adair daily  . Cataracts, bilateral   . Colitis, ulcerative ([place])   . DJD (degenerative joint disease)   . Glaucoma    uses eye drops daily  . History of blood transfusion    no abnormal reaction noted. 40+ yrs ago  . History of bronchitis    yrs ago  . History of gout    not on any meds  . Hypertension    takes Amlodipine daily  . Joint pain   . Joint swelling   . Psoriasis   . Urinary frequency    takes Ditropan daily  . Vertigo    doesn't take any meds  . Vitamin D deficiency     Past Surgical History:  Procedure Laterality Date  . BACK SURGERY    . COLONOSCOPY    . ILEOSTOMY    . KNEE ARTHROPLASTY Left 12/14/2015   Procedure: COMPUTER ASSISTED LEFT TOTAL KNEE ARTHROPLASTY;  Surgeon: Jessy Oto, MD;  Location: [place];  Service: Orthopedics;  Laterality: Left;  . TOTAL KNEE ARTHROPLASTY Left 12/14/2015  . TOTAL KNEE ARTHROPLASTY Right 06/27/2016  . TOTAL KNEE ARTHROPLASTY Right 06/27/2016   Procedure: RIGHT TOTAL KNEE ARTHROPLASTY;  Surgeon: Jessy Oto, MD;  Location: [place];  Service:  Orthopedics;  Laterality: Right;  . TUBAL LIGATION      There were no vitals filed for this visit.      Subjective Assessment - 08/19/16 1027    Subjective Biggest problem is her lower leg, rather than her knee. Knee 0/10. wears compression stocking on leg.    Currently in Pain? Yes   Pain Score 5    Pain Location Leg   Pain Orientation Lower            [facility] PT Assessment - 08/19/16 0001      PROM   Right Knee Extension -3   Right Knee Flexion 117     Strength   Right Hip Flexion 4/5   Right Hip Extension 4-/5   Right Hip ABduction 5/5   Right Knee Flexion 5/5   Right Knee Extension 5/5                     [facility] Adult PT Treatment/Exercise - 08/19/16 0001      Knee/Hip Exercises: Stretches   Passive Hamstring Stretch Limitations seated EOB     Knee/Hip Exercises: Seated   Sit to Sand 5 reps     Knee/Hip Exercises: Supine   Straight Leg Raises 10 reps  Knee/Hip Exercises: Sidelying   Hip ABduction 15 reps                PT Education - 2016-09-18 1153    Education provided Yes   Education Details HEP, vascular care   Person(s) Educated Patient   Methods Explanation;Demonstration;Tactile cues;Verbal cues   Comprehension Verbalized understanding;Returned demonstration          PT Short Term Goals - 2016-09-18 1155      PT SHORT TERM GOAL #1   Title Pt will demo 0-120 deg knee ROM, indicating necessary ROM for functional daily activities by 12/15   Baseline -3-117   Status Not Met     PT SHORT TERM GOAL #2   Title Gross MMT to 5/5 to indicate proper support to knee joint during daily activities   Status Achieved     PT SHORT TERM GOAL #3   Title FOTO to 62% to indicate significant functional improvement.    Baseline 53% at d/c   Status Not Met     PT SHORT TERM GOAL #4   Title Pt will demo SLS for at least 5 sec without UE support to indicate proper balance for safety following TKA   Status Achieved     PT SHORT TERM  GOAL #5   Title Pt will demo ability to climb stairs step over step with minimal UE assist to improve community ambulation ability   Status Achieved                  Plan - 09-18-16 1154    Clinical Impression Statement Pt verbalized readiness for d/c at this time and feels comfortable with HEP. Denies pain in knee, only discomfort in leg. We discussed exploring the option of vascular care for leg. Was instructed to contact us with any questions or needs.    Consulted and Agree with Plan of Care Patient      Patient will benefit from skilled therapeutic intervention in order to improve the following deficits and impairments:     Visit Diagnosis: Right knee pain, unspecified chronicity  Difficulty in walking, not elsewhere classified  Stiffness of right knee, not elsewhere classified  Muscle weakness (generalized)       G-Codes - 09-18-2016 1156    Functional Assessment Tool Used FOTO 53% ability (goal 62% ability), clinical judgement   Functional Limitation Mobility: Walking and moving around   Mobility: Walking and Moving Around Current Status [phone]) At least 40 percent but less than 60 percent impaired, limited or restricted   Mobility: Walking and Moving Around Goal Status [phone]) At least 20 percent but less than 40 percent impaired, limited or restricted   Mobility: Walking and Moving Around Discharge Status [phone]) At least 20 percent but less than 40 percent impaired, limited or restricted      Problem List Patient Active Problem List   Diagnosis Date Noted  . Leukocytosis 06/29/2016  . Anemia 06/29/2016  . Fever   . Acute blood loss anemia   . AKI (acute kidney injury) ([place])   . Primary localized osteoarthritis of right knee 06/27/2016  . Osteoarthritis of left knee 12/14/2015    Class: End Stage  . Osteoarthritis of right knee 12/14/2015  . Small bowel obstruction, partial 08/30/2013  . Acute renal failure ([place]) 08/30/2013  . S/p total colectomy in  1977 08/30/2013  . N&V (nausea and vomiting) 08/30/2013  . Abdominal pain, acute 08/30/2013  . SBO (small bowel obstruction) 08/30/2013  . Hypokalemia 08/30/2013  .  Partial small bowel obstruction 08/30/2013  . Postmenopausal bleeding 03/18/2013  . OSTEOARTHRITIS, KNEE, LEFT 03/20/2010  . TRICUSPID REGURGITATION, MILD 03/03/2010  . RENAL INSUFFICIENCY 03/02/2010  . MORBID OBESITY 02/18/2010  . PREMATURE ATRIAL CONTRACTIONS 02/18/2010  . PREMATURE VENTRICULAR CONTRACTIONS 02/18/2010  . CARDIAC MURMUR 02/18/2010  . EDEMA 10/20/2009  . HYPOKALEMIA 07/30/2009  . RIB PAIN, RIGHT SIDED 05/26/2008  . ABDOMINAL PAIN, LEFT LOWER QUADRANT 12/31/2007  . ALLERGIC RHINITIS 06/25/2007  . ABNORMAL BLOOD CHEMISTRY , OTHER 06/25/2007  . UNSPECIFIED VITAMIN D DEFICIENCY 04/30/2007  . Hypocalcemia 04/19/2007  . GLAUCOMA NEC 04/19/2007  . HYPERTENSION, ESSENTIAL NOS 04/19/2007  . ASTHMA 04/19/2007  . COLITIS, ULCERATIVE NOS 04/19/2007  . DISORDER, MENSTRUAL NEC 04/19/2007  . PSORIASIS 04/19/2007  . 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 GOUT NOS 02/21/2006   PHYSICAL THERAPY DISCHARGE SUMMARY  Visits from Start of Care: 6  Current functional level related to goals / functional outcomes: See above   Remaining deficits: See above   Education / Equipment: Anatomy of condition, POC, HEP, exercise form/rationale  Plan: Patient agrees to discharge.  Patient goals were partially met. Patient is being discharged due to being pleased with the current functional level.  ?????    Katisha Shimizu C. Garyn Arlotta PT, DPT 08/19/16 11:58 AM   [facility] [address] [place], [place], [address] Phone: [phone]   Fax:  [phone]  Name: Jackie Parker MRN: [id number] Date of Birth: 1941/02/14

## 2016-08-29 ENCOUNTER — Encounter (INDEPENDENT_AMBULATORY_CARE_PROVIDER_SITE_OTHER): Payer: Self-pay | Admitting: Specialist

## 2016-08-29 ENCOUNTER — Ambulatory Visit (INDEPENDENT_AMBULATORY_CARE_PROVIDER_SITE_OTHER): Payer: Medicare Other | Admitting: Specialist

## 2016-08-29 VITALS — BP 116/71 | HR 66 | Ht 62.0 in | Wt 183.0 lb

## 2016-08-29 DIAGNOSIS — I878 Other specified disorders of veins: Secondary | ICD-10-CM

## 2016-08-29 DIAGNOSIS — Z96651 Presence of right artificial knee joint: Secondary | ICD-10-CM

## 2016-08-29 NOTE — Patient Instructions (Signed)
Will schedule for you to see a vascular surgeon for right leg venous stasis evaluation. Weight bear as tolerated on the right leg. May bath normally.  Use cane for ambulation as time goes by  You may be able to stop using the cane. Keep working on range of motion of the knees even though therapy has stopped.  Call if you need renewal of pain medication.

## 2016-08-29 NOTE — Progress Notes (Signed)
Office Visit Note   Patient: Jackie Parker           Date of Birth: Jan 03, 1941           MRN: [id number] Visit Date: 08/29/2016              Requested by: Nolene Ebbs, MD [place], [place] [address] PCP: Philis Fendt, MD   Assessment & Plan: Visit Diagnoses: No diagnosis found.  Plan: Will schedule for you to see a vascular surgeon for right leg venous stasis evaluation. Weight bear as tolerated on the right leg. May bath normally.  Use cane for ambulation as time goes by  You may be able to stop using the cane. Keep working on range of motion of the knees even though therapy has stopped.  Call if you need renewal of pain medication.     Follow-Up Instructions: No Follow-up on file.   Orders:  No orders of the defined types were placed in this encounter.  No orders of the defined types were placed in this encounter.     Procedures: No procedures performed   Clinical Data: No additional findings.   Subjective: Chief Complaint  Patient presents with  . Right Knee - Follow-up    Patient returns for follow up right knee pain. She is status post right total knee arthroplasty on 06/27/2016.  She states that she is doing much better. The pain is almost gone.  She still has a little swelling, but it has greatly improved. She takes Tramadol for pain with relief. She has finished physical therapy.  She ambulates with a cane.    Review of Systems  HENT: Negative.   Eyes: Negative.   Respiratory: Negative.   Cardiovascular: Positive for leg swelling.  Gastrointestinal: Negative.   Endocrine: Negative.   Genitourinary: Negative.   Musculoskeletal: Negative.   Skin: Negative.   Neurological: Negative for tremors, syncope, weakness, light-headedness and numbness.  Hematological: Negative.   Psychiatric/Behavioral: Negative.      Objective: Vital Signs: There were no vitals taken for this visit.  Physical Exam  Constitutional: She is  oriented to person, place, and time. She appears well-developed and well-nourished.  HENT:  Head: Normocephalic and atraumatic.  Eyes: EOM are normal. Pupils are equal, round, and reactive to light.  Neck: Normal range of motion. Neck supple.  Pulmonary/Chest: Effort normal and breath sounds normal.  Abdominal: Soft. Bowel sounds are normal.  Musculoskeletal: She exhibits edema.       Right knee: She exhibits no effusion.  Neurological: She is alert and oriented to person, place, and time.  Skin: Skin is warm and dry.  Psychiatric: She has a normal mood and affect. Her behavior is normal. Judgment and thought content normal.    Right Knee Exam   Tenderness  The patient is experiencing no tenderness.     Range of Motion  Extension: normal  Flexion: 120   Tests  McMurray:  Medial - negative Lateral - negative Lachman:  Anterior - negative    Posterior - negative Drawer:       Anterior - negative    Posterior - negative Varus: negative Valgus: negative  Other  Erythema: absent Scars: absent Sensation: normal Pulse: present Swelling: mild Other tests: no effusion present      Specialty Comments:  No specialty comments available.  Imaging: No results found.   PMFS History: Patient Active Problem List   Diagnosis Date Noted  . Leukocytosis 06/29/2016  . Anemia 06/29/2016  .  Fever   . Acute blood loss anemia   . AKI (acute kidney injury) ([place])   . Primary localized osteoarthritis of right knee 06/27/2016  . Osteoarthritis of left knee 12/14/2015    Class: End Stage  . Osteoarthritis of right knee 12/14/2015  . Small bowel obstruction, partial 08/30/2013  . Acute renal failure ([place]) 08/30/2013  . 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 N&V (nausea and vomiting) 08/30/2013  . Abdominal pain, acute 08/30/2013  . SBO (small bowel obstruction) 08/30/2013  . Hypokalemia 08/30/2013  . Partial small bowel obstruction 08/30/2013  . Postmenopausal bleeding  03/18/2013  . OSTEOARTHRITIS, KNEE, LEFT 03/20/2010  . TRICUSPID REGURGITATION, MILD 03/03/2010  . RENAL INSUFFICIENCY 03/02/2010  . MORBID OBESITY 02/18/2010  . PREMATURE ATRIAL CONTRACTIONS 02/18/2010  . PREMATURE VENTRICULAR CONTRACTIONS 02/18/2010  . CARDIAC MURMUR 02/18/2010  . EDEMA 10/20/2009  . HYPOKALEMIA 07/30/2009  . RIB PAIN, RIGHT SIDED 05/26/2008  . ABDOMINAL PAIN, LEFT LOWER QUADRANT 12/31/2007  . ALLERGIC RHINITIS 06/25/2007  . ABNORMAL BLOOD CHEMISTRY , OTHER 06/25/2007  . UNSPECIFIED VITAMIN D DEFICIENCY 04/30/2007  . Hypocalcemia 04/19/2007  . GLAUCOMA NEC 04/19/2007  . HYPERTENSION, ESSENTIAL NOS 04/19/2007  . ASTHMA 04/19/2007  . COLITIS, ULCERATIVE NOS 04/19/2007  . DISORDER, MENSTRUAL NEC 04/19/2007  . PSORIASIS 04/19/2007  . 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 GOUT NOS 02/21/2006   Past Medical History:  Diagnosis Date  . AKI (acute kidney injury) ([place]) 12/2015  . Allergy    takes Singulair and Zyrtec daily  . Asthma    uses Adair daily  . Cataracts, bilateral   . Colitis, ulcerative ([place])   . DJD (degenerative joint disease)   . Glaucoma    uses eye drops daily  . History of blood transfusion    no abnormal reaction noted. 40+ yrs ago  . History of bronchitis    yrs ago  . History of gout    not on any meds  . Hypertension    takes Amlodipine daily  . Joint pain   . Joint swelling   . Psoriasis   . Urinary frequency    takes Ditropan daily  . Vertigo    doesn't take any meds  . Vitamin D deficiency     Family History  Problem Relation Age of Onset  . Hypertension Sister   . Glaucoma Sister     Past Surgical History:  Procedure Laterality Date  . BACK SURGERY    . COLONOSCOPY    . ILEOSTOMY    . KNEE ARTHROPLASTY Left 12/14/2015   Procedure: COMPUTER ASSISTED LEFT TOTAL KNEE ARTHROPLASTY;  Surgeon: Jessy Oto, MD;  Location: [place];  Service: Orthopedics;  Laterality: Left;  . TOTAL KNEE ARTHROPLASTY Left 12/14/2015  .  TOTAL KNEE ARTHROPLASTY Right 06/27/2016  . TOTAL KNEE ARTHROPLASTY Right 06/27/2016   Procedure: RIGHT TOTAL KNEE ARTHROPLASTY;  Surgeon: Jessy Oto, MD;  Location: [place];  Service: Orthopedics;  Laterality: Right;  . TUBAL LIGATION     Social History   Occupational History  . Not on file.   Social History Main Topics  . Smoking status: Never Smoker  . Smokeless tobacco: Never Used  . Alcohol use No  . Drug use: No  . Sexual activity: No

## 2016-09-02 ENCOUNTER — Other Ambulatory Visit: Payer: Self-pay | Admitting: *Deleted

## 2016-09-02 DIAGNOSIS — I872 Venous insufficiency (chronic) (peripheral): Secondary | ICD-10-CM

## 2016-09-20 ENCOUNTER — Other Ambulatory Visit (INDEPENDENT_AMBULATORY_CARE_PROVIDER_SITE_OTHER): Payer: Self-pay | Admitting: Specialist

## 2016-09-20 NOTE — Telephone Encounter (Signed)
Patient called needing Rx refilled (tramadol) The number to contact patient is [phone]

## 2016-09-21 MED ORDER — TRAMADOL HCL 50 MG PO TABS: ORAL_TABLET | ORAL | 0 refills | Status: DC

## 2016-09-21 NOTE — Telephone Encounter (Signed)
Patient is aware rx is ready for pick up at the front desk

## 2016-09-21 NOTE — Telephone Encounter (Signed)
Rx approved, printed and signed, may be called into pharmacy but [employer] will not allow fax. jen

## 2016-10-13 ENCOUNTER — Ambulatory Visit (INDEPENDENT_AMBULATORY_CARE_PROVIDER_SITE_OTHER): Payer: Medicare Other

## 2016-10-13 ENCOUNTER — Ambulatory Visit (INDEPENDENT_AMBULATORY_CARE_PROVIDER_SITE_OTHER): Payer: Medicare Other | Admitting: Specialist

## 2016-10-13 ENCOUNTER — Encounter (INDEPENDENT_AMBULATORY_CARE_PROVIDER_SITE_OTHER): Payer: Self-pay | Admitting: Specialist

## 2016-10-13 VITALS — BP 145/80 | HR 73 | Ht 62.0 in | Wt 190.0 lb

## 2016-10-13 DIAGNOSIS — Z96651 Presence of right artificial knee joint: Secondary | ICD-10-CM | POA: Diagnosis not present

## 2016-10-13 NOTE — Progress Notes (Signed)
Office Visit Note   Patient: Jackie Parker           Date of Birth: [birth year]           MRN: [id number] Visit Date: 10/13/2016              Requested by: Nolene Ebbs, MD [address] [place], [place] [address] PCP: Philis Fendt, MD   Assessment & Plan: Visit Diagnoses:  1. Status post total right knee replacement     Plan: Patient doing well at this point and is pleased with surgical result. Follow-up in 6 weeks for recheck. We'll repeat x-ray at next appointment to make sure that the area of heterotopic bone is not progressing.  Follow-Up Instructions: Return in about 6 weeks (around 11/24/2016).   Orders:  Orders Placed This Encounter  Procedures  . XR Knee 1-2 Views Right   No orders of the defined types were placed in this encounter.     Procedures: No procedures performed   Clinical Data: No additional findings.   Subjective: Chief Complaint  Patient presents with  . Right Knee - Follow-up    Jackie Parker is here to follow up on Right total knee arthroplasty.  She is 15 weeks and 3 days out from surgery.  She says that she is doing good, no problems.      Review of Systems  Constitutional: Negative.   HENT: Negative.   Respiratory: Negative.   Cardiovascular: Negative.   Gastrointestinal: Negative.   Genitourinary: Negative.   Musculoskeletal: Negative.   Psychiatric/Behavioral: Negative.      Objective: Vital Signs: BP (!) 145/80 (BP Location: Left Arm, Patient Position: Sitting)   Pulse 73   Ht 5' 2"  (1.575 m)   Wt 190 lb (86.2 kg)   BMI 34.75 kg/m   Physical Exam  Constitutional: She is oriented to person, place, and time. No distress.  HENT:  Head: Normocephalic.  Eyes: EOM are normal. Pupils are equal, round, and reactive to light.  Neck: Normal range of motion.  Pulmonary/Chest: No respiratory distress.  Musculoskeletal:  Right knee range of motion about 0-110. Mild swelling. Calf Nontender and she is neurovascularly  intact.  Neurological: She is alert and oriented to person, place, and time.    Ortho Exam  Specialty Comments:  No specialty comments available.  Imaging: Xr Knee 1-2 Views Right  Result Date: 10/13/2016 X-rays right knee show good alignment and seating of the components. 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    PMFS History: Patient Active Problem List   Diagnosis Date Noted  . Leukocytosis 06/29/2016  . Anemia 06/29/2016  . Fever   . Acute blood loss anemia   . AKI (acute kidney injury) ([place])   . Primary localized osteoarthritis of right knee 06/27/2016  . Osteoarthritis of left knee 12/14/2015    Class: End Stage  . Osteoarthritis of right knee 12/14/2015  . Small bowel obstruction, partial 08/30/2013  . Acute renal failure ([place]) 08/30/2013  . 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 N&V (nausea and vomiting) 08/30/2013  . Abdominal pain, acute 08/30/2013  . SBO (small bowel obstruction) 08/30/2013  . Hypokalemia 08/30/2013  . Partial small bowel obstruction 08/30/2013  . Postmenopausal bleeding 03/18/2013  . OSTEOARTHRITIS, KNEE, LEFT 03/20/2010  . TRICUSPID REGURGITATION, MILD 03/03/2010  . RENAL INSUFFICIENCY 03/02/2010  . MORBID OBESITY 02/18/2010  . PREMATURE ATRIAL CONTRACTIONS 02/18/2010  . PREMATURE VENTRICULAR CONTRACTIONS 02/18/2010  . CARDIAC MURMUR 02/18/2010  . EDEMA  10/20/2009  . HYPOKALEMIA 07/30/2009  . RIB PAIN, RIGHT SIDED 05/26/2008  . ABDOMINAL PAIN, LEFT LOWER QUADRANT 12/31/2007  . ALLERGIC RHINITIS 06/25/2007  . ABNORMAL BLOOD CHEMISTRY , OTHER 06/25/2007  . UNSPECIFIED VITAMIN D DEFICIENCY 04/30/2007  . Hypocalcemia 04/19/2007  . GLAUCOMA NEC 04/19/2007  . HYPERTENSION, ESSENTIAL NOS 04/19/2007  . ASTHMA 04/19/2007  . COLITIS, ULCERATIVE NOS 04/19/2007  . DISORDER, MENSTRUAL NEC 04/19/2007  . PSORIASIS 04/19/2007  . 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 GOUT NOS 02/21/2006   Past Medical History:    Diagnosis Date  . AKI (acute kidney injury) ([place]) 12/2015  . Allergy    takes Singulair and Zyrtec daily  . Asthma    uses Adair daily  . Cataracts, bilateral   . Colitis, ulcerative ([place])   . DJD (degenerative joint disease)   . Glaucoma    uses eye drops daily  . History of blood transfusion    no abnormal reaction noted. 40+ yrs ago  . History of bronchitis    yrs ago  . History of gout    not on any meds  . Hypertension    takes Amlodipine daily  . Joint pain   . Joint swelling   . Psoriasis   . Urinary frequency    takes Ditropan daily  . Vertigo    doesn't take any meds  . Vitamin D deficiency     Family History  Problem Relation Age of Onset  . Hypertension Sister   . Glaucoma Sister     Past Surgical History:  Procedure Laterality Date  . BACK SURGERY    . COLONOSCOPY    . ILEOSTOMY    . KNEE ARTHROPLASTY Left 12/14/2015   Procedure: COMPUTER ASSISTED LEFT TOTAL KNEE ARTHROPLASTY;  Surgeon: Jessy Oto, MD;  Location: [place];  Service: Orthopedics;  Laterality: Left;  . TOTAL KNEE ARTHROPLASTY Left 12/14/2015  . TOTAL KNEE ARTHROPLASTY Right 06/27/2016  . TOTAL KNEE ARTHROPLASTY Right 06/27/2016   Procedure: RIGHT TOTAL KNEE ARTHROPLASTY;  Surgeon: Jessy Oto, MD;  Location: [place];  Service: Orthopedics;  Laterality: Right;  . TUBAL LIGATION     Social History   Occupational History  . Not on file.   Social History Main Topics  . Smoking status: Never Smoker  . Smokeless tobacco: Never Used  . Alcohol use No  . Drug use: No  . Sexual activity: No

## 2016-10-24 ENCOUNTER — Encounter: Payer: Self-pay | Admitting: Vascular Surgery

## 2016-11-01 ENCOUNTER — Ambulatory Visit (HOSPITAL_COMMUNITY)
Admission: RE | Admit: 2016-11-01 | Discharge: 2016-11-01 | Disposition: A | Discharge status: Home / Self Care | Payer: Medicare Other | Source: Ambulatory Visit | Attending: Vascular Surgery | Admitting: Vascular Surgery

## 2016-11-01 ENCOUNTER — Encounter: Payer: Self-pay | Admitting: Vascular Surgery

## 2016-11-01 ENCOUNTER — Ambulatory Visit (INDEPENDENT_AMBULATORY_CARE_PROVIDER_SITE_OTHER): Payer: Medicare Other | Admitting: Vascular Surgery

## 2016-11-01 VITALS — BP 137/84 | HR 76 | Temp 97.2°F | Resp 18 | Ht 62.0 in | Wt 169.0 lb

## 2016-11-01 DIAGNOSIS — I872 Venous insufficiency (chronic) (peripheral): Secondary | ICD-10-CM | POA: Diagnosis present

## 2016-11-01 DIAGNOSIS — I87303 Chronic venous hypertension (idiopathic) without complications of bilateral lower extremity: Secondary | ICD-10-CM | POA: Diagnosis not present

## 2016-11-01 NOTE — Progress Notes (Signed)
[facility] [place]  Patient name: Jackie Parker MRN: [id number] DOB: 1941/04/25 Sex: female  REASON FOR CONSULT: Evaluation of bilateral lower from the swelling right greater than left  HPI: Jackie Parker is a 76 y.o. female, who is here today for evaluation of lower extremity swelling. She has had staged bilateral lower extremity placements. Most recently right knee. She has had significant swelling following this. He does not have any history of DVT and has no known history of venous insufficiency or varicosities. She does have some swelling in her left leg but this is markedly less in her right. She reports that she does not notice any increase of swelling throughout the day. Does have skin changes associated with this with brawny edema bilaterally.  Past Medical History:  Diagnosis Date  . AKI (acute kidney injury) ([place]) 12/2015  . Allergy    takes Singulair and Zyrtec daily  . Asthma    uses Adair daily  . Cataracts, bilateral   . Colitis, ulcerative ([place])   . DJD (degenerative joint disease)   . Glaucoma    uses eye drops daily  . History of blood transfusion    no abnormal reaction noted. 40+ yrs ago  . History of bronchitis    yrs ago  . History of gout    not on any meds  . Hypertension    takes Amlodipine daily  . Joint pain   . Joint swelling   . Psoriasis   . Urinary frequency    takes Ditropan daily  . Vertigo    doesn't take any meds  . Vitamin D deficiency     Family History  Problem Relation Age of Onset  . Hypertension Sister   . Glaucoma Sister     SOCIAL HISTORY: Social History   Social History  . Marital status: Single    Spouse name: N/A  . Number of children: N/A  . Years of education: N/A   Occupational History  . Not on file.   Social History Main Topics  . Smoking status: Never Smoker  . Smokeless tobacco: Never Used  . Alcohol use No  . Drug use: No  . Sexual  activity: No   Other Topics Concern  . Not on file   Social History Narrative  . No narrative on file    Allergies  Allergen Reactions  . Sulfonamide Derivatives Rash    Current Outpatient Prescriptions  Medication Sig Dispense Refill  . amLODipine (NORVASC) 5 MG tablet Take 5 mg by mouth daily.    . brinzolamide (AZOPT) 1 % ophthalmic suspension Place 1 drop into the right eye 2 (two) times daily.    . cetirizine (ZYRTEC) 10 MG tablet Take 10 mg by mouth daily.  5  . cholecalciferol (VITAMIN D) 1000 units tablet Take 1,000 Units by mouth daily.    . Fluticasone-Salmeterol (ADVAIR) 250-50 MCG/DOSE AEPB Inhale 1 puff into the lungs every 12 (twelve) hours.    . gabapentin (NEURONTIN) 100 MG capsule Take 100 mg by mouth daily as needed. For back pain.    Marland Kitchen LUMIGAN 0.01 % SOLN Place 1 drop into both eyes at bedtime.    . montelukast (SINGULAIR) 10 MG tablet Take 10 mg by mouth daily.     Marland Kitchen oxybutynin (DITROPAN) 5 MG tablet Take 5 mg by mouth 2 (two) times daily.    . prochlorperazine (COMPAZINE) 10 MG tablet Take 10 mg by mouth 2 (two) times daily as needed. For nausea/vomiting.    Marland Kitchen  traMADol (ULTRAM) 50 MG tablet TAKE 1/2-1 TABLET BY MOUTH THREE TIMES DAILY DURING THE DAY AS NEEDED FOR PAIN 90 tablet 0  . cephALEXin (KEFLEX) 500 MG capsule Take 1 capsule (500 mg total) by mouth 2 (two) times daily. (Patient not taking: Reported on 11/01/2016) 6 capsule 0  . methocarbamol (ROBAXIN) 500 MG tablet Take 1 tablet (500 mg total) by mouth every 8 (eight) hours as needed for muscle spasms. (Patient not taking: Reported on 11/01/2016) 50 tablet 0  . oxyCODONE-acetaminophen (ROXICET) 5-325 MG tablet Take 1-2 tablets by mouth every 4 (four) hours as needed for severe pain. (Patient not taking: Reported on 11/01/2016) 60 tablet 0   No current facility-administered medications for this visit.     REVIEW OF SYSTEMS:  [X]  denotes positive finding, [ ]  denotes negative finding Cardiac  Comments:    Chest pain or chest pressure:    Shortness of breath upon exertion:    Short of breath when lying flat:    Irregular heart rhythm:        Vascular    Pain in calf, thigh, or hip brought on by ambulation:    Pain in feet at night that wakes you up from your sleep:     Blood clot in your veins:    Leg swelling:         Pulmonary    Oxygen at home:    Productive cough:     Wheezing:         Neurologic    Sudden weakness in arms or legs:     Sudden numbness in arms or legs:     Sudden onset of difficulty speaking or slurred speech:    Temporary loss of vision in one eye:     Problems with dizziness:         Gastrointestinal    Blood in stool:     Vomited blood:         Genitourinary    Burning when urinating:     Blood in urine:        Psychiatric    Major depression:         Hematologic    Bleeding problems:    Problems with blood clotting too easily:        Skin    Rashes or ulcers:        Constitutional    Fever or chills:      PHYSICAL EXAM: Vitals:   11/01/16 1352  BP: 137/84  Pulse: 76  Resp: 18  Temp: 97.2 F (36.2 C)  TempSrc: Oral  SpO2: 96%  Weight: 169 lb (76.7 kg)  Height: 5' 2"  (1.575 m)    GENERAL: The patient is a well-nourished female, in no acute distress. The vital signs are documented above. CARDIOVASCULAR: Palpable dorsalis pedis pulses bilaterally. 2+ radial pulses bilaterally PULMONARY: There is good air exchange  ABDOMEN: Soft and non-tender  MUSCULOSKELETAL: There are no major deformities or cyanosis. NEUROLOGIC: No focal weakness or paresthesias are detected. SKIN: Bilateral [place] edema greater on the right than on the left. Thickening of the skin circumferentially on both lower extremities without ulceration PSYCHIATRIC: The patient has a normal affect.  DATA:  Right leg venous duplex shows no evidence of DVT. She does have reflux at her common femoral vein but no other reflux in her deep or superficial system  MEDICAL  ISSUES: I discussed this at length with the patient. No evidence of severe venous or arterial pathology. Recommend continued observation  only. Suspect this is related to postop changes after her knee surgery. Did explain the possible use of compression garments. She has tried these in the past and had a very difficult time tolerating these. She will see [place] again on an as-needed basis   Rosetta Posner, MD [facility] Tel [phone] Pager [phone]

## 2016-11-02 ENCOUNTER — Encounter: Payer: Self-pay | Admitting: Family Medicine

## 2016-11-02 ENCOUNTER — Ambulatory Visit: Payer: Medicare Other | Attending: Family Medicine | Admitting: Family Medicine

## 2016-11-02 VITALS — BP 130/77 | HR 92 | Temp 98.2°F | Ht 62.0 in | Wt 171.0 lb

## 2016-11-02 DIAGNOSIS — H4089 Other specified glaucoma: Secondary | ICD-10-CM

## 2016-11-02 DIAGNOSIS — Z882 Allergy status to sulfonamides status: Secondary | ICD-10-CM | POA: Insufficient documentation

## 2016-11-02 DIAGNOSIS — Z9049 Acquired absence of other specified parts of digestive tract: Secondary | ICD-10-CM

## 2016-11-02 DIAGNOSIS — Z8709 Personal history of other diseases of the respiratory system: Secondary | ICD-10-CM

## 2016-11-02 DIAGNOSIS — Z96653 Presence of artificial knee joint, bilateral: Secondary | ICD-10-CM | POA: Insufficient documentation

## 2016-11-02 DIAGNOSIS — K51918 Ulcerative colitis, unspecified with other complication: Secondary | ICD-10-CM | POA: Diagnosis not present

## 2016-11-02 DIAGNOSIS — Z79899 Other long term (current) drug therapy: Secondary | ICD-10-CM | POA: Insufficient documentation

## 2016-11-02 DIAGNOSIS — M170 Bilateral primary osteoarthritis of knee: Secondary | ICD-10-CM | POA: Diagnosis not present

## 2016-11-02 DIAGNOSIS — I10 Essential (primary) hypertension: Secondary | ICD-10-CM | POA: Diagnosis not present

## 2016-11-02 DIAGNOSIS — M5136 Other intervertebral disc degeneration, lumbar region: Secondary | ICD-10-CM | POA: Insufficient documentation

## 2016-11-02 DIAGNOSIS — H409 Unspecified glaucoma: Secondary | ICD-10-CM | POA: Diagnosis not present

## 2016-11-02 DIAGNOSIS — J449 Chronic obstructive pulmonary disease, unspecified: Secondary | ICD-10-CM | POA: Insufficient documentation

## 2016-11-02 MED ORDER — ALBUTEROL SULFATE HFA 108 (90 BASE) MCG/ACT IN AERS: 2.0000 | INHALATION_SPRAY | Freq: Four times a day (QID) | RESPIRATORY_TRACT | 1 refills | Status: DC | PRN

## 2016-11-02 MED ORDER — AMLODIPINE BESYLATE 5 MG PO TABS: 5.0000 mg | ORAL_TABLET | Freq: Every day | ORAL | 5 refills | Status: DC

## 2016-11-02 NOTE — Progress Notes (Signed)
Subjective:  Patient ID: Jackie Parker, female    DOB: Jan 24, 1941  Age: 76 y.o. MRN: [id number]  CC: Hypertension and Asthma   HPI Anieya Helman presents To establish care. 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 Both knees are doing well. She has degenerative disc disease of the lumbar spine and has intermittent back pain which is absent at this time. Previously followed by Dr Nolene Ebbs.  She is unsure if she has a history of asthma or COPD but informs me she was given Proventil inhaler and Advair for Bronchitis". She has not used these inhalers in a while. She has no concerns at this time.  Past Medical History:  Diagnosis Date  . AKI (acute kidney injury) ([place]) 12/2015  . Allergy    takes Singulair and Zyrtec daily  . Asthma    uses Adair daily  . Cataracts, bilateral   . Colitis, ulcerative ([place])   . DJD (degenerative joint disease)   . Glaucoma    uses eye drops daily  . History of blood transfusion    no abnormal reaction noted. 40+ yrs ago  . History of bronchitis    yrs ago  . History of gout    not on any meds  . Hypertension    takes Amlodipine daily  . Joint pain   . Joint swelling   . Psoriasis   . Urinary frequency    takes Ditropan daily  . Vertigo    doesn't take any meds  . Vitamin D deficiency     Past Surgical History:  Procedure Laterality Date  . BACK SURGERY    . COLONOSCOPY    . ILEOSTOMY    . KNEE ARTHROPLASTY Left 12/14/2015   Procedure: COMPUTER ASSISTED LEFT TOTAL KNEE ARTHROPLASTY;  Surgeon: Jessy Oto, MD;  Location: [facility];  Service: Orthopedics;  Laterality: Left;  . TOTAL KNEE ARTHROPLASTY Left 12/14/2015  . TOTAL KNEE ARTHROPLASTY Right 06/27/2016  . TOTAL KNEE ARTHROPLASTY Right 06/27/2016   Procedure: RIGHT TOTAL KNEE ARTHROPLASTY;   Surgeon: Jessy Oto, MD;  Location: [facility];  Service: Orthopedics;  Laterality: Right;  . TUBAL LIGATION      Allergies  Allergen Reactions  . Sulfonamide Derivatives Rash     Outpatient Medications Prior to Visit  Medication Sig Dispense Refill  . brinzolamide (AZOPT) 1 % ophthalmic suspension Place 1 drop into the right eye 2 (two) times daily.    . cetirizine (ZYRTEC) 10 MG tablet Take 10 mg by mouth daily.  5  . cholecalciferol (VITAMIN D) 1000 units tablet Take 1,000 Units by mouth daily.    Marland Kitchen LUMIGAN 0.01 % SOLN Place 1 drop into both eyes at bedtime.    . prochlorperazine (COMPAZINE) 10 MG tablet Take 10 mg by mouth 2 (two) times daily as needed. For nausea/vomiting.    Marland Kitchen amLODipine (NORVASC) 5 MG tablet Take 5 mg by mouth daily.    . Fluticasone-Salmeterol (ADVAIR) 250-50 MCG/DOSE AEPB Inhale 1 puff into the lungs every 12 (twelve) hours.    . montelukast (SINGULAIR) 10 MG tablet Take 10 mg by mouth daily.     Marland Kitchen gabapentin (NEURONTIN) 100 MG capsule Take 100 mg by mouth daily as needed. For back pain.    . methocarbamol (ROBAXIN) 500 MG tablet Take 1 tablet (500 mg total) by mouth every 8 (eight) hours as needed for  muscle spasms. (Patient not taking: Reported on 11/01/2016) 50 tablet 0  . cephALEXin (KEFLEX) 500 MG capsule Take 1 capsule (500 mg total) by mouth 2 (two) times daily. (Patient not taking: Reported on 11/01/2016) 6 capsule 0  . oxybutynin (DITROPAN) 5 MG tablet Take 5 mg by mouth 2 (two) times daily.    Marland Kitchen oxyCODONE-acetaminophen (ROXICET) 5-325 MG tablet Take 1-2 tablets by mouth every 4 (four) hours as needed for severe pain. (Patient not taking: Reported on 11/01/2016) 60 tablet 0  . traMADol (ULTRAM) 50 MG tablet TAKE 1/2-1 TABLET BY MOUTH THREE TIMES DAILY DURING THE DAY AS NEEDED FOR PAIN (Patient not taking: Reported on 11/02/2016) 90 tablet 0   No facility-administered medications prior to visit.     ROS Review of Systems  Constitutional: Negative for  activity change, appetite change and fatigue.  HENT: Negative for congestion, sinus pressure and sore throat.   Eyes: Negative for visual disturbance.  Respiratory: Negative for cough, chest tightness, shortness of breath and wheezing.   Cardiovascular: Negative for chest pain and palpitations.  Gastrointestinal: Negative for abdominal distention, abdominal pain and constipation.  Endocrine: Negative for polydipsia.  Genitourinary: Negative for dysuria and frequency.  Musculoskeletal: Negative for arthralgias and back pain.  Skin: Negative for rash.  Neurological: Negative for tremors, light-headedness and numbness.  Hematological: Does not bruise/bleed easily.  Psychiatric/Behavioral: Negative for agitation and behavioral problems.    Objective:  BP 130/77 (BP Location: Right Arm, Patient Position: Sitting, Cuff Size: Small)   Pulse 92   Temp 98.2 F (36.8 C) (Oral)   Ht 5' 2"  (1.575 m)   Wt 171 lb (77.6 kg)   SpO2 97%   BMI 31.28 kg/m   BP/Weight 11/02/2016 7/37/1062 02/18/4853  Systolic BP 627 035 009  Diastolic BP 77 84 80  Wt. (Lbs) 171 169 190  BMI 31.28 30.91 34.75     Physical Exam  Constitutional: She is oriented to person, place, and time. She appears well-developed and well-nourished.  Cardiovascular: Normal rate, normal heart sounds and intact distal pulses.   No murmur heard. Pulmonary/Chest: Effort normal and breath sounds normal. She has no wheezes. She has no rales. She exhibits no tenderness.  Abdominal: Soft. Bowel sounds are normal. She exhibits no distension and no mass. There is no tenderness.  Colostomy bag in place  Musculoskeletal: Normal range of motion.  Neurological: She is alert and oriented to person, place, and time.     Assessment & Plan:   1. S/p total colectomy in 1977 No evidence of infection at site of oral steroids  2. Essential hypertension Controlled Low-sodium diet - amLODipine (NORVASC) 5 MG tablet; Take 1 tablet (5 mg total)  by mouth daily.  Dispense: 30 tablet; Refill: 5 - COMPLETE METABOLIC PANEL WITH GFR; Future - Lipid panel; Future  3. Ulcerative colitis with other complication, unspecified location ([place]) No acute flare  4. Other glaucoma of both eyes Followed by ophthalmology at [facility]  5. H/O chronic bronchitis ? H/o Asthma vs COPD Patient is asymptomatic DC Advair Advised to use Proventil as needed.   Meds ordered this encounter  Medications  . amLODipine (NORVASC) 5 MG tablet    Sig: Take 1 tablet (5 mg total) by mouth daily.    Dispense:  30 tablet    Refill:  5  . albuterol (PROVENTIL HFA;VENTOLIN HFA) 108 (90 Base) MCG/ACT inhaler    Sig: Inhale 2 puffs into the lungs every 6 (six) hours as needed for wheezing or shortness  of breath.    Dispense:  1 Inhaler    Refill:  1    Follow-up: Return in about 3 months (around 01/30/2017) for Follow-up on hypertension.   Arnoldo Morale MD

## 2016-11-02 NOTE — Patient Instructions (Signed)
Hypertension Hypertension, commonly called high blood pressure, is when the force of blood pumping through your arteries is too strong. Your arteries are the blood vessels that carry blood from your heart throughout your body. A blood pressure reading consists of a higher number over a lower number, such as 110/72. The higher number (systolic) is the pressure inside your arteries when your heart pumps. The lower number (diastolic) is the pressure inside your arteries when your heart relaxes. Ideally you want your blood pressure below 120/80. Hypertension forces your heart to work harder to pump blood. Your arteries may become narrow or stiff. Having untreated or uncontrolled hypertension can cause heart attack, stroke, kidney disease, and other problems. What increases the risk? Some risk factors for high blood pressure are controllable. Others are not. Risk factors you cannot control include:  Race. You may be at higher risk if you are African American.  Age. Risk increases with age.  Gender. Men are at higher risk than women before age 45 years. After age 65, women are at higher risk than men. Risk factors you can control include:  Not getting enough exercise or physical activity.  Being overweight.  Getting too much fat, sugar, calories, or salt in your diet.  Drinking too much alcohol. What are the signs or symptoms? Hypertension does not usually cause signs or symptoms. Extremely high blood pressure (hypertensive crisis) may cause headache, anxiety, shortness of breath, and nosebleed. How is this diagnosed? To check if you have hypertension, your health care provider will measure your blood pressure while you are seated, with your arm held at the level of your heart. It should be measured at least twice using the same arm. Certain conditions can cause a difference in blood pressure between your right and left arms. A blood pressure reading that is higher than normal on one occasion does  not mean that you need treatment. If it is not clear whether you have high blood pressure, you may be asked to return on a different day to have your blood pressure checked again. Or, you may be asked to monitor your blood pressure at home for 1 or more weeks. How is this treated? Treating high blood pressure includes making lifestyle changes and possibly taking medicine. Living a healthy lifestyle can help lower high blood pressure. You may need to change some of your habits. Lifestyle changes may include:  Following the DASH diet. This diet is high in fruits, vegetables, and whole grains. It is low in salt, red meat, and added sugars.  Keep your sodium intake below 2,300 mg per day.  Getting at least 30-45 minutes of aerobic exercise at least 4 times per week.  Losing weight if necessary.  Not smoking.  Limiting alcoholic beverages.  Learning ways to reduce stress. Your health care provider may prescribe medicine if lifestyle changes are not enough to get your blood pressure under control, and if one of the following is true:  You are 18-59 years of age and your systolic blood pressure is above 140.  You are 60 years of age or older, and your systolic blood pressure is above 150.  Your diastolic blood pressure is above 90.  You have diabetes, and your systolic blood pressure is over 140 or your diastolic blood pressure is over 90.  You have kidney disease and your blood pressure is above 140/90.  You have heart disease and your blood pressure is above 140/90. Your personal target blood pressure may vary depending on your medical   conditions, your age, and other factors. Follow these instructions at home:  Have your blood pressure rechecked as directed by your health care provider.  Take medicines only as directed by your health care provider. Follow the directions carefully. Blood pressure medicines must be taken as prescribed. The medicine does not work as well when you skip  doses. Skipping doses also puts you at risk for problems.  Do not smoke.  Monitor your blood pressure at home as directed by your health care provider. Contact a health care provider if:  You think you are having a reaction to medicines taken.  You have recurrent headaches or feel dizzy.  You have swelling in your ankles.  You have trouble with your vision. Get help right away if:  You develop a severe headache or confusion.  You have unusual weakness, numbness, or feel faint.  You have severe chest or abdominal pain.  You vomit repeatedly.  You have trouble breathing. This information is not intended to replace advice given to you by your health care provider. Make sure you discuss any questions you have with your health care provider. Document Released: 08/29/2005 Document Revised: 02/04/2016 Document Reviewed: 06/21/2013 Elsevier Interactive Patient Education  2017 [employer]  

## 2016-11-08 ENCOUNTER — Ambulatory Visit: Payer: Medicare Other | Admitting: Obstetrics

## 2016-11-08 ENCOUNTER — Encounter: Payer: Self-pay | Admitting: Obstetrics and Gynecology

## 2016-11-08 ENCOUNTER — Ambulatory Visit (INDEPENDENT_AMBULATORY_CARE_PROVIDER_SITE_OTHER): Payer: Medicare Other | Admitting: Obstetrics and Gynecology

## 2016-11-08 VITALS — BP 136/81 | HR 96 | Temp 98.6°F | Ht 62.0 in | Wt 168.0 lb

## 2016-11-08 DIAGNOSIS — Z01419 Encounter for gynecological examination (general) (routine) without abnormal findings: Secondary | ICD-10-CM | POA: Diagnosis not present

## 2016-11-08 DIAGNOSIS — N3941 Urge incontinence: Secondary | ICD-10-CM

## 2016-11-08 DIAGNOSIS — Z0000 Encounter for general adult medical examination without abnormal findings: Secondary | ICD-10-CM

## 2016-11-08 DIAGNOSIS — R32 Unspecified urinary incontinence: Secondary | ICD-10-CM | POA: Insufficient documentation

## 2016-11-08 DIAGNOSIS — N950 Postmenopausal bleeding: Secondary | ICD-10-CM

## 2016-11-08 NOTE — Progress Notes (Signed)
Jackie Parker is a 76 y.o. G88P0 female here for a routine annual gynecologic exam. She reports problems with urge urinary incont. Has used Oxybutrin in the past but had to discontinue d/t side effects. Voids 1-2 times during the day but 5-6 times at night. Does not wear a pad. Denies accidents and any stress urinary incont Sx. Denies any excessive water or caffeine intake as well.  She has a Mirena IUD for PMD, it was placed 7/14. She reports no bleeding since placement. Mammogram last year normal. She is not sexual active.  Medical problems as listed and are stable. Managed by PCP.     Denies abnormal vaginal bleeding, discharge, or pelvic pain   Gynecologic History No LMP recorded. Patient is postmenopausal. Contraception: post menopausal status Last Pap: 2014. Results were: normal Last mammogram: 2017. Results were: normal  Obstetric History OB History  Gravida Para Term Preterm AB Living  5            SAB TAB Ectopic Multiple Live Births          5    # Outcome Date GA Lbr Len/2nd Weight Sex Delivery Anes PTL Lv  5 Gravida      Vag-Spont     4 Gravida           3 Gravida           2 Gravida           1 [place]               Past Medical History:  Diagnosis Date  . AKI (acute kidney injury) ([facility]) 12/2015  . Allergy    takes Singulair and Zyrtec daily  . Asthma    uses Adair daily  . Cataracts, bilateral   . Colitis, ulcerative ([place])   . DJD (degenerative joint disease)   . Glaucoma    uses eye drops daily  . History of blood transfusion    no abnormal reaction noted. 40+ yrs ago  . History of bronchitis    yrs ago  . History of gout    not on any meds  . Hypertension    takes Amlodipine daily  . Joint pain   . Joint swelling   . Psoriasis   . Urinary frequency    takes Ditropan daily  . Vertigo    doesn't take any meds  . Vitamin D deficiency     Past Surgical History:  Procedure Laterality Date  . BACK SURGERY    . COLONOSCOPY    . ILEOSTOMY     . KNEE ARTHROPLASTY Left 12/14/2015   Procedure: COMPUTER ASSISTED LEFT TOTAL KNEE ARTHROPLASTY;  Surgeon: Jessy Oto, MD;  Location: [facility];  Service: Orthopedics;  Laterality: Left;  . TOTAL KNEE ARTHROPLASTY Left 12/14/2015  . TOTAL KNEE ARTHROPLASTY Right 06/27/2016  . TOTAL KNEE ARTHROPLASTY Right 06/27/2016   Procedure: RIGHT TOTAL KNEE ARTHROPLASTY;  Surgeon: Jessy Oto, MD;  Location: [facility];  Service: Orthopedics;  Laterality: Right;  . TUBAL LIGATION      Current Outpatient Prescriptions on File Prior to Visit  Medication Sig Dispense Refill  . albuterol (PROVENTIL HFA;VENTOLIN HFA) 108 (90 Base) MCG/ACT inhaler Inhale 2 puffs into the lungs every 6 (six) hours as needed for wheezing or shortness of breath. 1 Inhaler 1  . amLODipine (NORVASC) 5 MG tablet Take 1 tablet (5 mg total) by mouth daily. 30 tablet 5  . cetirizine (ZYRTEC) 10 MG tablet Take 10 mg by  mouth daily.  5  . cholecalciferol (VITAMIN D) 1000 units tablet Take 1,000 Units by mouth daily.    Marland Kitchen gabapentin (NEURONTIN) 100 MG capsule Take 100 mg by mouth daily as needed. For back pain.    Marland Kitchen LUMIGAN 0.01 % SOLN Place 1 drop into both eyes at bedtime.    . methocarbamol (ROBAXIN) 500 MG tablet Take 1 tablet (500 mg total) by mouth every 8 (eight) hours as needed for muscle spasms. 50 tablet 0  . Pumpkin Seed-Soy Germ (AZO BLADDER CONTROL/GO-LESS PO) Take by mouth.    . brinzolamide (AZOPT) 1 % ophthalmic suspension Place 1 drop into the right eye 2 (two) times daily.    . prochlorperazine (COMPAZINE) 10 MG tablet Take 10 mg by mouth 2 (two) times daily as needed. For nausea/vomiting.     No current facility-administered medications on file prior to visit.     Allergies  Allergen Reactions  . Sulfonamide Derivatives Rash    Social History   Social History  . Marital status: Single    Spouse name: N/A  . Number of children: N/A  . Years of education: N/A   Occupational History  . Not on file.   Social  History Main Topics  . Smoking status: Never Smoker  . Smokeless tobacco: Never Used  . Alcohol use No  . Drug use: No  . Sexual activity: No   Other Topics Concern  . Not on file   Social History Narrative  . No narrative on file    Family History  Problem Relation Age of Onset  . Hypertension Sister   . Glaucoma Sister     The following portions of the patient's history were reviewed and updated as appropriate: allergies, current medications, past family history, past medical history, past social history, past surgical history and problem list.  Review of Systems Pertinent items noted in HPI and remainder of comprehensive ROS otherwise negative.   Objective:  BP 136/81 (BP Location: Left Arm, Patient Position: Sitting)   Pulse 96   Temp 98.6 F (37 C) (Oral)   Ht 5' 2"  (1.575 m)   Wt 168 lb (76.2 kg)   BMI 30.73 kg/m  CONSTITUTIONAL: Well-developed, well-nourished female in no acute distress.  HENT:  Normocephalic, atraumatic, External right and left ear normal. Oropharynx is clear and moist EYES: Conjunctivae and EOM are normal. Pupils are equal, round, and reactive to light. No scleral icterus.  NECK: Normal range of motion, supple, no masses.  Normal thyroid.  SKIN: Skin is warm and dry. No rash noted. Not diaphoretic. No erythema. No pallor. [place]: Alert and oriented to person, place, and time. Normal reflexes, muscle tone coordination. No cranial nerve deficit noted. PSYCHIATRIC: Normal mood and affect. Normal behavior. Normal judgment and thought content. CARDIOVASCULAR: Normal heart rate noted, regular rhythm RESPIRATORY: Clear to auscultation bilaterally. Effort and breath sounds normal, no problems with respiration noted. BREASTS: Pt declined. ABDOMEN: Soft, normal bowel sounds, no distention noted.  No tenderness, rebound or guarding.  PELVIC: Normal appearing external genitalia; normal appearing vaginal mucosa and cervix.  No abnormal discharge noted.   Pap smear declined and not indicated. IUD string noted  Normal uterine size, no other palpable masses, no uterine or adnexal tenderness. MUSCULOSKELETAL: Normal range of motion. No tenderness.  No cyanosis, clubbing, or edema.  2+ distal pulses.   Assessment:  Annual gynecologic examination with pap smear  PMB Urinary incont Plan:  Continue with Mirena IUD, Due for removal 03/2018. Mammogram scheduled Routine  preventative health maintenance measures emphasized. Discussed treatment options for urinary incont. Discussed Vesicare and Myrbetriq. To discuss with Opth d/t glaucoma. Pessary and referral to [place] also discussed. Will check UA and Uc.  She will call back with information form Opth. Will start medication if able and F/U in 2 months after starting. She o/w will f/u in 1 yr or PRN Please refer to After Visit Summary for other counseling recommendations.    Chancy Milroy, MD, [place] Attending [place] for [facility], [place]

## 2016-11-08 NOTE — Patient Instructions (Signed)
Urinary Incontinence Urinary incontinence is the involuntary loss of urine from your bladder. What are the causes? There are many causes of urinary incontinence. They include:  Medicines.  Infections.  Prostatic enlargement, leading to overflow of urine from your bladder.  Surgery.  Neurological diseases.  Emotional factors. What are the signs or symptoms? Urinary Incontinence can be divided into four types: 1. Urge incontinence. Urge incontinence is the involuntary loss of urine before you have the opportunity to go to the bathroom. There is a sudden urge to void but not enough time to reach a bathroom. 2. Stress incontinence. Stress incontinence is the sudden loss of urine with any activity that forces urine to pass. It is commonly caused by anatomical changes to the pelvis and sphincter areas of your body. 3. Overflow incontinence. Overflow incontinence is the loss of urine from an obstructed opening to your bladder. This results in a backup of urine and a resultant buildup of pressure within the bladder. When the pressure within the bladder exceeds the closing pressure of the sphincter, the urine overflows, which causes incontinence, similar to water overflowing a dam. 4. Total incontinence. Total incontinence is the loss of urine as a result of the inability to store urine within your bladder. How is this diagnosed? Evaluating the cause of incontinence may require:  A thorough and complete medical and obstetric history.  A complete physical exam.  Laboratory tests such as a urine culture and sensitivities. When additional tests are indicated, they can include:  An ultrasound exam.  Kidney and bladder X-rays.  Cystoscopy. This is an exam of the bladder using a narrow scope.  Urodynamic testing to test the nerve function to the bladder and sphincter areas. How is this treated? Treatment for urinary incontinence depends on the cause:  For urge incontinence caused by a  bacterial infection, antibiotics will be prescribed. If the urge incontinence is related to medicines you take, your health care provider may have you change the medicine.  For stress incontinence, surgery to re-establish anatomical support to the bladder or sphincter, or both, will often correct the condition.  For overflow incontinence caused by an enlarged prostate, an operation to open the channel through the enlarged prostate will allow the flow of urine out of the bladder. In women with fibroids, a hysterectomy may be recommended.  For total incontinence, surgery on your urinary sphincter may help. An artificial urinary sphincter (an inflatable cuff placed around the urethra) may be required. In women who have developed a hole-like passage between their bladder and vagina (vesicovaginal fistula), surgery to close the fistula often is required. Follow these instructions at home:  Normal daily hygiene and the use of pads or adult diapers that are changed regularly will help prevent odors and skin damage.  Avoid caffeine. It can overstimulate your bladder.  Use the bathroom regularly. Try about every 2-3 hours to go to the bathroom, even if you do not feel the need to do so. Take time to empty your bladder completely. After urinating, wait a minute. Then try to urinate again.  For causes involving nerve dysfunction, keep a log of the medicines you take and a journal of the times you go to the bathroom. Contact a health care provider if:  You experience worsening of pain instead of improvement in pain after your procedure.  Your incontinence becomes worse instead of better. Get help right away if:  You experience fever or shaking chills.  You are unable to pass your urine.  You  have redness spreading into your groin or down into your thighs. This information is not intended to replace advice given to you by your health care provider. Make sure you discuss any questions you have with  your health care provider. Document Released: 10/06/2004 Document Revised: 04/08/2016 Document Reviewed: 02/05/2013 Elsevier Interactive Patient Education  2017 [employer].

## 2016-11-09 ENCOUNTER — Ambulatory Visit: Payer: Medicare Other | Attending: Family Medicine

## 2016-11-09 DIAGNOSIS — I10 Essential (primary) hypertension: Secondary | ICD-10-CM | POA: Insufficient documentation

## 2016-11-09 LAB — COMPLETE METABOLIC PANEL WITH GFR
ALT: 14 U/L (ref 6–29)
AST: 30 U/L (ref 10–35)
Albumin: 3 g/dL — ABNORMAL LOW (ref 3.6–5.1)
Alkaline Phosphatase: 95 U/L (ref 33–130)
BUN: 14 mg/dL (ref 7–25)
CO2: 23 mmol/L (ref 20–31)
Calcium: 8.7 mg/dL (ref 8.6–10.4)
Chloride: 109 mmol/L (ref 98–110)
Creat: 1.16 mg/dL — ABNORMAL HIGH (ref 0.60–0.93)
GFR, Est African American: 53 mL/min — ABNORMAL LOW (ref >=60)
GFR, Est Non African American: 46 mL/min — ABNORMAL LOW (ref >=60)
Glucose, Bld: 80 mg/dL (ref 65–99)
Potassium: 3.5 mmol/L (ref 3.5–5.3)
Sodium: 143 mmol/L (ref 135–146)
Total Bilirubin: 0.9 mg/dL (ref 0.2–1.2)
Total Protein: 7.2 g/dL (ref 6.1–8.1)

## 2016-11-09 LAB — LIPID PANEL
Cholesterol: 145 mg/dL (ref <200)
HDL: 55 mg/dL (ref >50)
LDL Cholesterol: 75 mg/dL (ref <100)
Total CHOL/HDL Ratio: 2.6 Ratio (ref <5.0)
Triglycerides: 76 mg/dL (ref <150)
VLDL: 15 mg/dL (ref <30)

## 2016-11-09 LAB — URINALYSIS
Bilirubin, UA: NEGATIVE
Glucose, UA: NEGATIVE
Ketones, UA: NEGATIVE
Nitrite, UA: NEGATIVE
Protein, UA: NEGATIVE
RBC, UA: NEGATIVE
Specific Gravity, UA: 1.019 (ref 1.005–1.030)
Urobilinogen, Ur: 0.2 mg/dL (ref 0.2–1.0)
pH, UA: 7.5 (ref 5.0–7.5)

## 2016-11-09 NOTE — Progress Notes (Signed)
Patient here for lab visit only 

## 2016-11-10 ENCOUNTER — Telehealth: Payer: Self-pay

## 2016-11-10 LAB — URINE CULTURE

## 2016-11-10 NOTE — Telephone Encounter (Signed)
Writer called patient and discussed lab results.  Patient stated understanding.

## 2016-11-10 NOTE — Telephone Encounter (Signed)
-----   Message from Arnoldo Morale, MD sent at 11/10/2016 11:11 AM EST ----- Labs are stable

## 2016-11-15 ENCOUNTER — Telehealth: Payer: Self-pay

## 2016-11-15 DIAGNOSIS — R32 Unspecified urinary incontinence: Secondary | ICD-10-CM

## 2016-11-15 MED ORDER — SOLIFENACIN SUCCINATE 5 MG PO TABS: 5.0000 mg | ORAL_TABLET | Freq: Every day | ORAL | 1 refills | Status: DC

## 2016-11-15 NOTE — Telephone Encounter (Signed)
Returned call, patient stated that per provider request she checked with her opthamologist and was told that the meds they discussed were safe to take, sent rx to pharmacy per provider order and advised patient to set up follow appt, patient stated that she will have to call back.

## 2016-11-24 ENCOUNTER — Ambulatory Visit (INDEPENDENT_AMBULATORY_CARE_PROVIDER_SITE_OTHER): Payer: Self-pay

## 2016-11-24 ENCOUNTER — Encounter (INDEPENDENT_AMBULATORY_CARE_PROVIDER_SITE_OTHER): Payer: Self-pay | Admitting: Specialist

## 2016-11-24 ENCOUNTER — Ambulatory Visit (INDEPENDENT_AMBULATORY_CARE_PROVIDER_SITE_OTHER): Payer: Medicare Other | Admitting: Specialist

## 2016-11-24 VITALS — BP 124/73 | HR 81 | Ht 62.0 in | Wt 169.0 lb

## 2016-11-24 DIAGNOSIS — M25562 Pain in left knee: Secondary | ICD-10-CM | POA: Diagnosis not present

## 2016-11-24 DIAGNOSIS — M25561 Pain in right knee: Secondary | ICD-10-CM | POA: Diagnosis not present

## 2016-11-24 LAB — CBC WITH DIFFERENTIAL/PLATELET
Basophils Absolute: 77 cells/uL (ref 0–200)
Basophils Relative: 1 %
Eosinophils Absolute: 308 cells/uL (ref 15–500)
Eosinophils Relative: 4 %
HCT: 43 % (ref 35.0–45.0)
Hemoglobin: 14.2 g/dL (ref 11.7–15.5)
Lymphocytes Relative: 52 %
Lymphs Abs: 4004 cells/uL — ABNORMAL HIGH (ref 850–3900)
MCH: 30.7 pg (ref 27.0–33.0)
MCHC: 33 g/dL (ref 32.0–36.0)
MCV: 92.9 fL (ref 80.0–100.0)
MPV: 9.2 fL (ref 7.5–12.5)
Monocytes Absolute: 693 cells/uL (ref 200–950)
Monocytes Relative: 9 %
Neutro Abs: 2618 cells/uL (ref 1500–7800)
Neutrophils Relative %: 34 %
Platelets: 215 10*3/uL (ref 140–400)
RBC: 4.63 MIL/uL (ref 3.80–5.10)
RDW: 15.2 % — ABNORMAL HIGH (ref 11.0–15.0)
WBC: 7.7 10*3/uL (ref 3.8–10.8)

## 2016-11-24 NOTE — Progress Notes (Signed)
Office Visit Note   Patient: Jackie Parker           Date of Birth: May 30, 1941           MRN: [id number] Visit Date: 11/24/2016              Requested by: Nolene Ebbs, MD [address] [place],  [address] PCP: Arnoldo Morale, MD   Assessment & Plan: Visit Diagnoses:  1. Left knee pain, unspecified chronicity   2. Right knee pain, unspecified chronicity     Plan:ROM exercises of the knees, tylenol or advil to help with pain or swelling or discomfort. May use a walker or cane for ambulation to take stress off the knee that is most painful. Problem is knee inflamation wiith swelling.  Need to rule out infection but also consider possible rheumatologic condition as a cause for recurring swelling in her knees.  May use ice intermittently to both knee for 15-0 minute three or four times a days. Return visit in follow up.     Follow-Up Instructions: No Follow-up on file.   Orders:  Orders Placed This Encounter  Procedures  . XR Knee 1-2 Views Right  . XR Knee 1-2 Views Left   No orders of the defined types were placed in this encounter.     Procedures: No procedures performed   Clinical Data: No additional findings.   Subjective: Chief Complaint  Patient presents with  . Right Knee - Pain  . Left Knee - Pain    Jackie Parker is here for bilateral knee pain. She is post Right TKA on 06/27/2016 and Left TKA 12/14/2015.  She states that the left knee is bothering her today and doesn't know why.    Review of Systems   Objective: Vital Signs: BP 124/73 (BP Location: Left Arm, Patient Position: Sitting)   Pulse 81   Ht 5' 2"  (1.575 m)   Wt 169 lb (76.7 kg)   BMI 30.91 kg/m   Physical Exam  Ortho Exam  Specialty Comments:  No specialty comments available.  Imaging: No results found.   PMFS History: Patient Active Problem List   Diagnosis Date Noted  . Gynecologic exam normal 11/08/2016  . Urinary incontinence 11/08/2016  . Leukocytosis  06/29/2016  . Anemia 06/29/2016  . AKI (acute kidney injury) ([facility])   . Primary localized osteoarthritis of right knee 06/27/2016  . Osteoarthritis of left knee 12/14/2015    Class: End Stage  . Osteoarthritis of right knee 12/14/2015  . Acute renal failure ([place]) 08/30/2013  . 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 N&V (nausea and vomiting) 08/30/2013  . Hypokalemia 08/30/2013  . Postmenopausal bleeding 03/18/2013  . OSTEOARTHRITIS, KNEE, LEFT 03/20/2010  . TRICUSPID REGURGITATION, MILD 03/03/2010  . RENAL INSUFFICIENCY 03/02/2010  . MORBID OBESITY 02/18/2010  . PREMATURE ATRIAL CONTRACTIONS 02/18/2010  . PREMATURE VENTRICULAR CONTRACTIONS 02/18/2010  . CARDIAC MURMUR 02/18/2010  . EDEMA 10/20/2009  . HYPOKALEMIA 07/30/2009  . RIB PAIN, RIGHT SIDED 05/26/2008  . ABDOMINAL PAIN, LEFT LOWER QUADRANT 12/31/2007  . ALLERGIC RHINITIS 06/25/2007  . ABNORMAL BLOOD CHEMISTRY , OTHER 06/25/2007  . UNSPECIFIED VITAMIN D DEFICIENCY 04/30/2007  . Hypocalcemia 04/19/2007  . GLAUCOMA NEC 04/19/2007  . Essential hypertension 04/19/2007  . ASTHMA 04/19/2007  . Ulcerative colitis ([facility]) 04/19/2007  . DISORDER, MENSTRUAL NEC 04/19/2007  . PSORIASIS 04/19/2007  . 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 GOUT NOS 02/21/2006   Past Medical History:  Diagnosis Date  . AKI (acute kidney  injury) ([place] [facility]) 12/2015  . Allergy    takes Singulair and Zyrtec daily  . Asthma    uses Adair daily  . Cataracts, bilateral   . Colitis, ulcerative ([facility])   . DJD (degenerative joint disease)   . Glaucoma    uses eye drops daily  . History of blood transfusion    no abnormal reaction noted. 40+ yrs ago  . History of bronchitis    yrs ago  . History of gout    not on any meds  . Hypertension    takes Amlodipine daily  . Joint pain   . Joint swelling   . Psoriasis   . Urinary frequency    takes Ditropan daily  . Vertigo    doesn't take any meds  . Vitamin D deficiency     Family History    Problem Relation Age of Onset  . Hypertension Sister   . Glaucoma Sister     Past Surgical History:  Procedure Laterality Date  . BACK SURGERY    . COLONOSCOPY    . ILEOSTOMY    . KNEE ARTHROPLASTY Left 12/14/2015   Procedure: COMPUTER ASSISTED LEFT TOTAL KNEE ARTHROPLASTY;  Surgeon: Jessy Oto, MD;  Location: [facility];  Service: Orthopedics;  Laterality: Left;  . TOTAL KNEE ARTHROPLASTY Left 12/14/2015  . TOTAL KNEE ARTHROPLASTY Right 06/27/2016  . TOTAL KNEE ARTHROPLASTY Right 06/27/2016   Procedure: RIGHT TOTAL KNEE ARTHROPLASTY;  Surgeon: Jessy Oto, MD;  Location: [facility];  Service: Orthopedics;  Laterality: Right;  . TUBAL LIGATION     Social History   Occupational History  . Not on file.   Social History Main Topics  . Smoking status: Never Smoker  . Smokeless tobacco: Never Used  . Alcohol use No  . Drug use: No  . Sexual activity: No

## 2016-11-25 LAB — ANTI-NUCLEAR AB-TITER (ANA TITER): ANA Titer 1: 1:80 {titer} — ABNORMAL HIGH

## 2016-11-25 LAB — URIC ACID: Uric Acid, Serum: 7.3 mg/dL — ABNORMAL HIGH (ref 2.5–7.0)

## 2016-11-25 LAB — ANA: Anti Nuclear Antibody(ANA): POSITIVE — AB

## 2016-11-25 LAB — SEDIMENTATION RATE: Sed Rate: 34 mm/hr — ABNORMAL HIGH (ref 0–30)

## 2016-11-25 LAB — RHEUMATOID FACTOR: Rheumatoid fact SerPl-aCnc: <14 IU/mL (ref <14)

## 2016-11-25 LAB — HIGH SENSITIVITY CRP: CRP, High Sensitivity: 3.5 mg/L — ABNORMAL HIGH

## 2016-12-01 ENCOUNTER — Telehealth (INDEPENDENT_AMBULATORY_CARE_PROVIDER_SITE_OTHER): Payer: Self-pay | Admitting: *Deleted

## 2016-12-01 NOTE — Telephone Encounter (Signed)
Pt calling requesting call back about lab work. CB: [phone]

## 2016-12-01 NOTE — Telephone Encounter (Signed)
Pt calling requesting call back about lab work results.

## 2016-12-07 ENCOUNTER — Telehealth (INDEPENDENT_AMBULATORY_CARE_PROVIDER_SITE_OTHER): Payer: Self-pay | Admitting: Specialist

## 2016-12-07 NOTE — Telephone Encounter (Signed)
Patient called wanting to talk to you about her lab work.  She stated that she called last week but no one called her back.  CB#[phone]

## 2016-12-08 NOTE — Telephone Encounter (Signed)
Patient is calling about the status of her labs results

## 2016-12-13 ENCOUNTER — Other Ambulatory Visit (INDEPENDENT_AMBULATORY_CARE_PROVIDER_SITE_OTHER): Payer: Self-pay | Admitting: Specialist

## 2016-12-14 ENCOUNTER — Other Ambulatory Visit (INDEPENDENT_AMBULATORY_CARE_PROVIDER_SITE_OTHER): Payer: Self-pay | Admitting: Specialist

## 2016-12-14 ENCOUNTER — Ambulatory Visit: Payer: Medicare Other | Attending: Family Medicine | Admitting: Physician Assistant

## 2016-12-14 VITALS — BP 144/80 | HR 95 | Temp 98.3°F | Ht 62.0 in | Wt 175.8 lb

## 2016-12-14 DIAGNOSIS — Z882 Allergy status to sulfonamides status: Secondary | ICD-10-CM | POA: Insufficient documentation

## 2016-12-14 DIAGNOSIS — Z79899 Other long term (current) drug therapy: Secondary | ICD-10-CM | POA: Insufficient documentation

## 2016-12-14 DIAGNOSIS — R221 Localized swelling, mass and lump, neck: Secondary | ICD-10-CM

## 2016-12-14 DIAGNOSIS — I10 Essential (primary) hypertension: Secondary | ICD-10-CM

## 2016-12-14 MED ORDER — ALLOPURINOL 100 MG PO TABS: 100.0000 mg | ORAL_TABLET | Freq: Two times a day (BID) | ORAL | 11 refills | Status: DC

## 2016-12-14 MED ORDER — COLCHICINE 0.6 MG PO TABS: 0.6000 mg | ORAL_TABLET | Freq: Two times a day (BID) | ORAL | 1 refills | Status: DC

## 2016-12-14 NOTE — Telephone Encounter (Signed)
I spoke with this patient and provided the results of her lab tests, CRP was 3.5 and ANA elevated positive with 1:80 titier which is borderline positive. RF negative.Sed rate was 34 decreased for 07/2016 86. She needs a follow up evaluation and we may need to consider Rheumatology evaluation. Colcrys and allopurinol Rx sent to her pharmacy for history of gout and a mild elevation of uric acid 7.9.

## 2016-12-14 NOTE — Telephone Encounter (Signed)
Tramadol Refill Request

## 2016-12-14 NOTE — Progress Notes (Signed)
Jackie Parker, is a 76 y.o. female  PZW:[id number]  [id number]  DOB - [birth year]  Subjective:  Chief Complaint and HPI: Jackie Parker is a 76 y.o. female here today for 1 week h/o lump at the base of her L neck.  No cough, hemoptysis, f/c.  No dysphagia, no unexplained weight loss, no change in appetite or BMs.  She has not had f/up with rheumatology since her bloodwork.  ROS:   Constitutional:  No f/c, No night sweats, No unexplained weight loss. EENT:  No vision changes, No blurry vision, No hearing changes. No mouth, throat, or ear problems.  Respiratory: No cough, No SOB Cardiac: No CP, no palpitations GI:  No abd pain, No N/V/D. GU: No Urinary s/sx Musculoskeletal: No joint pain Neuro: No headache, no dizziness, no motor weakness.  Skin: No rash Endocrine:  No polydipsia. No polyuria.  Psych: Denies SI/HI  No problems updated.  ALLERGIES: Allergies  Allergen Reactions  . Sulfonamide Derivatives Rash    PAST MEDICAL HISTORY: Past Medical History:  Diagnosis Date  . AKI (acute kidney injury) ([place]) 12/2015  . Allergy    takes Singulair and Zyrtec daily  . Asthma    uses Adair daily  . Cataracts, bilateral   . Colitis, ulcerative ([place])   . DJD (degenerative joint disease)   . Glaucoma    uses eye drops daily  . History of blood transfusion    no abnormal reaction noted. 40+ yrs ago  . History of bronchitis    yrs ago  . History of gout    not on any meds  . Hypertension    takes Amlodipine daily  . Joint pain   . Joint swelling   . Psoriasis   . Urinary frequency    takes Ditropan daily  . Vertigo    doesn't take any meds  . Vitamin D deficiency     MEDICATIONS AT HOME: Prior to Admission medications   Medication Sig Start Date End Date Taking? Authorizing Provider  albuterol (PROVENTIL HFA;VENTOLIN HFA) 108 (90 Base) MCG/ACT inhaler Inhale 2 puffs into the lungs every 6 (six) hours as needed for wheezing or shortness of breath. 11/02/16   Yes Arnoldo Morale, MD  amLODipine (NORVASC) 5 MG tablet Take 1 tablet (5 mg total) by mouth daily. 11/02/16  Yes Arnoldo Morale, MD  brinzolamide (AZOPT) 1 % ophthalmic suspension Place 1 drop into the right eye 2 (two) times daily.   Yes Historical Provider, MD  cetirizine (ZYRTEC) 10 MG tablet Take 10 mg by mouth daily. 05/26/16  Yes Historical Provider, MD  cholecalciferol (VITAMIN D) 1000 units tablet Take 1,000 Units by mouth daily.   Yes Historical Provider, MD  gabapentin (NEURONTIN) 100 MG capsule Take 100 mg by mouth daily as needed. For back pain. 05/19/16  Yes Historical Provider, MD  LUMIGAN 0.01 % SOLN Place 1 drop into both eyes at bedtime. 05/27/16  Yes Historical Provider, MD  prochlorperazine (COMPAZINE) 10 MG tablet Take 10 mg by mouth 2 (two) times daily as needed. For nausea/vomiting. 04/11/16  Yes Historical Provider, MD  Pumpkin Seed-Soy Germ (AZO BLADDER CONTROL/GO-LESS PO) Take by mouth.   Yes Historical Provider, MD  solifenacin (VESICARE) 5 MG tablet Take 1 tablet (5 mg total) by mouth daily. 11/15/16  Yes Chancy Milroy, MD     Objective:  EXAM:   Vitals:   12/14/16 1339  BP: (!) 144/80  Pulse: 95  Temp: 98.3 F (36.8 C)  TempSrc: Oral  SpO2: 97%  Weight: 175 lb 12.8 oz (79.7 kg)  Height: 5' 2"  (1.575 m)    General appearance : A&OX3. NAD. Non-toxic-appearing HEENT: Atraumatic and Normocephalic.  PERRLA. EOM intact.  TM clear B. Mouth-MMM, post pharynx WNL w/o erythema, No PND. Neck: supple, no JVD. No cervical lymphadenopathy. No thyromegaly.  No bruit.  Base of L neck/medial clavicle-there is a firm area ~2X4cm that seems fixed to the medial clavicle and extending into the neck base.  Unable to appreciate borders.  Not well-circumscribed. Chest/Lungs:  Breathing-non-labored, Good air entry bilaterally, breath sounds normal without rales, rhonchi, or wheezing  CVS: S1 S2 regular, no murmurs, gallops, rubs  Extremities: Bilateral Lower Ext shows no edema, both legs  are warm to touch with = pulse throughout Neurology:  CN II-XII grossly intact, Non focal.   Psych:  TP linear. J/I WNL. Normal speech. Appropriate eye contact and affect.  Skin:  No Rash  Data Review Lab Results  Component Value Date   HGBA1C 5.4 01/17/2005     Assessment & Plan   1. Mass of left side of neck - CT SOFT TISSUE NECK WO CONTRAST; Future - TSH  2. Essential hypertension-suboptimal control Continue current regimen; check BP OOO and record and bring to next visit  Patient have been counseled extensively about nutrition and exercise  Return in about 4 weeks (around 01/11/2017) for Dr. Jarold Song for 3 month f/up and f/up lump on neck.  The patient was given clear instructions to go to ER or return to medical center if symptoms don't improve, worsen or new problems develop. The patient verbalized understanding. The patient was told to call to get lab results if they haven't heard anything in the next week.     Freeman Caldron, PA-C [facility] [place], [place]   12/14/2016, 2:08 PM

## 2016-12-15 LAB — TSH: TSH: 0.791 u[IU]/mL (ref 0.450–4.500)

## 2016-12-15 NOTE — Patient Instructions (Addendum)
ROM exercises of the knees, tylenol or advil to help with pain or swelling or discomfort. May use a walker or cane for ambulation to take stress off the knee that is most painful. Problem is knee inflamation wiith swelling.  Need to rule out infection but also consider possible rheumatologic condition as a cause for recurring swelling in her knees.  May use ice intermittently to both knee for 15-20 minutes three or four times a day. Return visit in follow up.

## 2016-12-16 ENCOUNTER — Telehealth: Payer: Self-pay

## 2016-12-16 NOTE — Telephone Encounter (Signed)
Contacted pt to go over lab results pt is aware of results and doesn't have any questions or concerns 

## 2016-12-19 ENCOUNTER — Ambulatory Visit (HOSPITAL_COMMUNITY)
Admission: RE | Admit: 2016-12-19 | Discharge: 2016-12-19 | Disposition: A | Discharge status: Home / Self Care | Payer: Medicare Other | Source: Ambulatory Visit | Attending: Physician Assistant | Admitting: Physician Assistant

## 2016-12-19 DIAGNOSIS — R221 Localized swelling, mass and lump, neck: Secondary | ICD-10-CM | POA: Diagnosis present

## 2016-12-19 NOTE — Telephone Encounter (Signed)
Called rx to [facility]

## 2016-12-21 ENCOUNTER — Telehealth: Payer: Self-pay

## 2016-12-21 NOTE — Telephone Encounter (Signed)
Contacted pt to go over CT results pt is aware of results and doesn't have any questions or concerns 

## 2017-01-15 ENCOUNTER — Ambulatory Visit (HOSPITAL_COMMUNITY)
Admission: EM | Admit: 2017-01-15 | Discharge: 2017-01-15 | Disposition: A | Discharge status: Home / Self Care | Payer: Medicare Other | Attending: Internal Medicine | Admitting: Internal Medicine

## 2017-01-15 ENCOUNTER — Encounter (HOSPITAL_COMMUNITY): Payer: Self-pay

## 2017-01-15 DIAGNOSIS — J4541 Moderate persistent asthma with (acute) exacerbation: Secondary | ICD-10-CM

## 2017-01-15 DIAGNOSIS — J0110 Acute frontal sinusitis, unspecified: Secondary | ICD-10-CM

## 2017-01-15 DIAGNOSIS — J40 Bronchitis, not specified as acute or chronic: Secondary | ICD-10-CM | POA: Diagnosis not present

## 2017-01-15 DIAGNOSIS — R062 Wheezing: Secondary | ICD-10-CM | POA: Diagnosis not present

## 2017-01-15 DIAGNOSIS — R05 Cough: Secondary | ICD-10-CM | POA: Diagnosis not present

## 2017-01-15 MED ORDER — AMOXICILLIN-POT CLAVULANATE 875-125 MG PO TABS: 1.0000 | ORAL_TABLET | Freq: Two times a day (BID) | ORAL | 0 refills | Status: AC

## 2017-01-15 MED ORDER — IPRATROPIUM-ALBUTEROL 0.5-2.5 (3) MG/3ML IN SOLN
RESPIRATORY_TRACT | Status: AC
  Filled 2017-01-15: qty 3

## 2017-01-15 MED ORDER — HYDROCOD POLST-CPM POLST ER 10-8 MG/5ML PO SUER
5.0000 mL | Freq: Every evening | ORAL | 0 refills | Status: DC | PRN
Start: 2017-01-15 — End: 2017-05-08

## 2017-01-15 MED ORDER — IPRATROPIUM-ALBUTEROL 0.5-2.5 (3) MG/3ML IN SOLN
3.0000 mL | Freq: Once | RESPIRATORY_TRACT | Status: AC
  Administered 2017-01-15: 3 mL via RESPIRATORY_TRACT

## 2017-01-15 MED ORDER — PREDNISONE 20 MG PO TABS: 40.0000 mg | ORAL_TABLET | Freq: Every day | ORAL | 0 refills | Status: AC

## 2017-01-15 NOTE — ED Triage Notes (Signed)
[facility] and they sent her over here for a neb treatment since they didn't have one in [facility]. Has been having cough and wheezing for 1 week.

## 2017-01-15 NOTE — Discharge Instructions (Signed)
Recommend start Augmentin 861m twice a day as directed. May use Prednisone 440mdaily for 5 days. Continue to monitor blood pressure- steroid (prednisone) can increase blood pressure. May take Tussionex 1 teaspoon at night to help with cough. Follow-up with your primary care provider in 2 days if not improving or go to ER if symptoms worsen.

## 2017-01-15 NOTE — ED Provider Notes (Signed)
CSN: [id number]     Arrival date & time 01/15/17  1317 History   First MD Initiated Contact with Patient 01/15/17 1455     Chief Complaint  Patient presents with  . Wheezing   (Consider location/radiation/quality/duration/timing/severity/associated sxs/prior Treatment) 76 year old female presents with wheezing, cough, chest congestion for over 1 week. Wheezing has gotten worse in the past few days. Also experiencing some nasal congestion but no fever or GI symptoms. Unable to sleep at night due to cough. Has history of asthma with seasonal allergies. Has been using Albuterol, Zyrtec and Singulair with minimal relief. Went to [facility] today and they sent her over to Urgent Care for further evaluation. Has history of multiple chronic health conditions, including HTN, arthritis, gout, and urinary incontinence.    The history is provided by the patient.    Past Medical History:  Diagnosis Date  . AKI (acute kidney injury) ([place]) 12/2015  . Allergy    takes Singulair and Zyrtec daily  . Asthma    uses Adair daily  . Cataracts, bilateral   . Colitis, ulcerative ([place])   . DJD (degenerative joint disease)   . Glaucoma    uses eye drops daily  . History of blood transfusion    no abnormal reaction noted. 40+ yrs ago  . History of bronchitis    yrs ago  . History of gout    not on any meds  . Hypertension    takes Amlodipine daily  . Joint pain   . Joint swelling   . Psoriasis   . Urinary frequency    takes Ditropan daily  . Vertigo    doesn't take any meds  . Vitamin D deficiency    Past Surgical History:  Procedure Laterality Date  . BACK SURGERY    . COLONOSCOPY    . ILEOSTOMY    . KNEE ARTHROPLASTY Left 12/14/2015   Procedure: COMPUTER ASSISTED LEFT TOTAL KNEE ARTHROPLASTY;  Surgeon: Jessy Oto, MD;  Location: [place];  Service: Orthopedics;  Laterality: Left;  . TOTAL KNEE ARTHROPLASTY Left 12/14/2015  . TOTAL KNEE ARTHROPLASTY Right 06/27/2016  . TOTAL KNEE  ARTHROPLASTY Right 06/27/2016   Procedure: RIGHT TOTAL KNEE ARTHROPLASTY;  Surgeon: Jessy Oto, MD;  Location: [place];  Service: Orthopedics;  Laterality: Right;  . TUBAL LIGATION     Family History  Problem Relation Age of Onset  . Hypertension Sister   . Glaucoma Sister    Social History  Substance Use Topics  . Smoking status: Never Smoker  . Smokeless tobacco: Never Used  . Alcohol use No   OB History    Gravida Para Term Preterm AB Living   5             SAB TAB Ectopic Multiple Live Births           5     Review of Systems  Constitutional: Positive for fatigue. Negative for appetite change, chills, diaphoresis and fever.  HENT: Positive for congestion, postnasal drip, rhinorrhea and sinus pressure. Negative for ear discharge, ear pain, nosebleeds and sore throat.   Eyes: Negative for discharge.  Respiratory: Positive for cough, chest tightness and wheezing. Negative for shortness of breath.   Cardiovascular: Negative for chest pain.  Gastrointestinal: Negative for abdominal pain, diarrhea, nausea and vomiting.  Musculoskeletal: Negative for neck pain and neck stiffness.  Skin: Negative for rash and wound.  Neurological: Positive for headaches. Negative for dizziness, tremors, syncope, weakness, light-headedness and numbness.  Hematological:  Negative for adenopathy.    Allergies  Sulfonamide derivatives  Home Medications   Prior to Admission medications   Medication Sig Start Date End Date Taking? Authorizing Provider  albuterol (PROVENTIL HFA;VENTOLIN HFA) 108 (90 Base) MCG/ACT inhaler Inhale 2 puffs into the lungs every 6 (six) hours as needed for wheezing or shortness of breath. 11/02/16  Yes Arnoldo Morale, MD  allopurinol (ZYLOPRIM) 100 MG tablet Take 1 tablet (100 mg total) by mouth 2 (two) times daily. 12/14/16  Yes Jessy Oto, MD  amLODipine (NORVASC) 5 MG tablet Take 1 tablet (5 mg total) by mouth daily. 11/02/16  Yes Arnoldo Morale, MD  brinzolamide (AZOPT)  1 % ophthalmic suspension Place 1 drop into the right eye 2 (two) times daily.   Yes [provider]  cetirizine (ZYRTEC) 10 MG tablet Take 10 mg by mouth daily. 05/26/16  Yes [provider]  cholecalciferol (VITAMIN D) 1000 units tablet Take 1,000 Units by mouth daily.   Yes [provider]  colchicine 0.6 MG tablet Take 1 tablet (0.6 mg total) by mouth 2 (two) times daily. 12/14/16  Yes Jessy Oto, MD  gabapentin (NEURONTIN) 100 MG capsule Take 100 mg by mouth daily as needed. For back pain. 05/19/16  Yes [provider]  LUMIGAN 0.01 % SOLN Place 1 drop into both eyes at bedtime. 05/27/16  Yes [provider]  prochlorperazine (COMPAZINE) 10 MG tablet Take 10 mg by mouth 2 (two) times daily as needed. For nausea/vomiting. 04/11/16  Yes [provider]  Pumpkin Seed-Soy Germ (AZO BLADDER CONTROL/GO-LESS PO) Take by mouth.   Yes [provider]  solifenacin (VESICARE) 5 MG tablet Take 1 tablet (5 mg total) by mouth daily. 11/15/16  Yes Chancy Milroy, MD  traMADol (ULTRAM) 50 MG tablet TAKE ONE-HALF TO 1 TABLET BY MOUTH THREE TIMES DAILY DURING THE DAY AS NEEDED FOR PAIN 12/19/16  Yes Jessy Oto, MD  amoxicillin-clavulanate (AUGMENTIN) 875-125 MG tablet Take 1 tablet by mouth every 12 (twelve) hours. 01/15/17 01/22/17  Katy Apo, NP  chlorpheniramine-HYDROcodone (TUSSIONEX PENNKINETIC ER) 10-8 MG/5ML SUER Take 5 mLs by mouth at bedtime as needed for cough. 01/15/17   Katy Apo, NP  predniSONE (DELTASONE) 20 MG tablet Take 2 tablets (40 mg total) by mouth daily. 01/15/17 01/20/17  Katy Apo, NP   Meds Ordered and Administered this Visit   Medications  ipratropium-albuterol (DUONEB) 0.5-2.5 (3) MG/3ML nebulizer solution 3 mL (3 mLs Nebulization Given 01/15/17 1507)    BP (!) 183/93   Pulse (!) 104   Temp 98.2 F (36.8 C) (Oral)   Resp 20   SpO2 96%  No data found.   Physical Exam  Constitutional: She is oriented to  person, place, and time. She appears well-developed and well-nourished. She is cooperative. No distress.  HENT:  Head: Normocephalic and atraumatic.  Right Ear: Hearing, tympanic membrane, external ear and ear canal normal.  Left Ear: Hearing, tympanic membrane, external ear and ear canal normal.  Nose: Mucosal edema present. Right sinus exhibits frontal sinus tenderness. Right sinus exhibits no maxillary sinus tenderness. Left sinus exhibits frontal sinus tenderness. Left sinus exhibits no maxillary sinus tenderness.  Mouth/Throat: Uvula is midline and mucous membranes are normal. Posterior oropharyngeal erythema present.  Neck: Normal range of motion. Neck supple.  Cardiovascular: Normal rate and regular rhythm.   Rechecked pulse rate = 88.   Pulmonary/Chest: Effort normal. No accessory muscle usage. No respiratory distress. She has no decreased breath  sounds. She has wheezes in the right upper field, the right middle field, the right lower field, the left upper field and the left lower field. She has rhonchi in the right upper field, the right lower field, the left upper field and the left lower field. She has no rales.  Musculoskeletal: Normal range of motion.  Lymphadenopathy:    She has no cervical adenopathy.  Neurological: She is alert and oriented to person, place, and time.  Skin: Skin is warm and dry. Capillary refill takes less than 2 seconds.  Psychiatric: She has a normal mood and affect. Her behavior is normal. Judgment and thought content normal.    Urgent Care Course     Procedures (including critical care time)  Labs Review Labs Reviewed - No data to display  Imaging Review No results found.   Visual Acuity Review  Right Eye Distance:   Left Eye Distance:   Bilateral Distance:    Right Eye Near:   Left Eye Near:    Bilateral Near:         MDM   1. Moderate persistent asthma with exacerbation   2. Acute non-recurrent frontal sinusitis   3. Bronchitis     Discussed various treatment options- will trial DuoNeb today. Patient tolerated nebulizer treatment well. Indicated that she could breath better- less wheezing and chest tightness. On exam, less wheezing heard in all lung fields but still present- mostly expiratory. Still rhonchi heard. Discussed possibility of chest x-ray but patient wants to trial medication first for symptoms. Recommend start Augmentin 843m twice a day as directed. She has had success in the past on Prednisone for her asthma exacerbations. Will use Prednisone 450mdaily for 5 days- discussed that this can raise blood pressure which is already elevated. Need to monitor. Patient has had cough medication with codeine in the past without side effects. Recommend Tussionex 1 teaspoon at night- use sparingly. [place] Controlled Substance Registry reviewed- only current Rx is Tramadol which was last filled in January 2018 and currently listed on her medication list. Recommend continue Albuterol 2 puffs every 6 hours as needed. Continue other allergy medication as needed. Follow-up with her primary care provider in 2 days if not improving or go to ER if symptoms worsen.     AmKaty ApoNP 01/16/17 1212

## 2017-01-25 ENCOUNTER — Ambulatory Visit: Payer: Medicare Other | Admitting: Obstetrics and Gynecology

## 2017-01-30 ENCOUNTER — Ambulatory Visit: Payer: Medicare Other | Attending: Family Medicine | Admitting: Family Medicine

## 2017-01-30 ENCOUNTER — Encounter: Payer: Self-pay | Admitting: Family Medicine

## 2017-01-30 VITALS — BP 133/74 | HR 92 | Temp 97.9°F | Resp 18 | Ht 62.0 in | Wt 179.0 lb

## 2017-01-30 DIAGNOSIS — R6884 Jaw pain: Secondary | ICD-10-CM | POA: Diagnosis not present

## 2017-01-30 DIAGNOSIS — M7989 Other specified soft tissue disorders: Secondary | ICD-10-CM | POA: Diagnosis not present

## 2017-01-30 DIAGNOSIS — Z9049 Acquired absence of other specified parts of digestive tract: Secondary | ICD-10-CM | POA: Insufficient documentation

## 2017-01-30 DIAGNOSIS — R42 Dizziness and giddiness: Secondary | ICD-10-CM | POA: Diagnosis not present

## 2017-01-30 DIAGNOSIS — M109 Gout, unspecified: Secondary | ICD-10-CM | POA: Diagnosis not present

## 2017-01-30 DIAGNOSIS — H4089 Other specified glaucoma: Secondary | ICD-10-CM | POA: Diagnosis not present

## 2017-01-30 DIAGNOSIS — E559 Vitamin D deficiency, unspecified: Secondary | ICD-10-CM | POA: Insufficient documentation

## 2017-01-30 DIAGNOSIS — H9202 Otalgia, left ear: Secondary | ICD-10-CM | POA: Diagnosis not present

## 2017-01-30 DIAGNOSIS — J4520 Mild intermittent asthma, uncomplicated: Secondary | ICD-10-CM | POA: Insufficient documentation

## 2017-01-30 DIAGNOSIS — R05 Cough: Secondary | ICD-10-CM | POA: Diagnosis not present

## 2017-01-30 DIAGNOSIS — Z96653 Presence of artificial knee joint, bilateral: Secondary | ICD-10-CM | POA: Diagnosis not present

## 2017-01-30 DIAGNOSIS — M170 Bilateral primary osteoarthritis of knee: Secondary | ICD-10-CM | POA: Diagnosis not present

## 2017-01-30 DIAGNOSIS — H409 Unspecified glaucoma: Secondary | ICD-10-CM | POA: Diagnosis not present

## 2017-01-30 DIAGNOSIS — I10 Essential (primary) hypertension: Secondary | ICD-10-CM

## 2017-01-30 DIAGNOSIS — Z882 Allergy status to sulfonamides status: Secondary | ICD-10-CM | POA: Insufficient documentation

## 2017-01-30 DIAGNOSIS — K5190 Ulcerative colitis, unspecified, without complications: Secondary | ICD-10-CM | POA: Insufficient documentation

## 2017-01-30 DIAGNOSIS — R059 Cough, unspecified: Secondary | ICD-10-CM

## 2017-01-30 MED ORDER — ALBUTEROL SULFATE HFA 108 (90 BASE) MCG/ACT IN AERS: 2.0000 | INHALATION_SPRAY | Freq: Four times a day (QID) | RESPIRATORY_TRACT | 1 refills | Status: DC | PRN

## 2017-01-30 MED ORDER — CETIRIZINE HCL 10 MG PO TABS: 10.0000 mg | ORAL_TABLET | Freq: Every day | ORAL | 1 refills | Status: DC

## 2017-01-30 MED ORDER — BENZONATATE 100 MG PO CAPS: 100.0000 mg | ORAL_CAPSULE | Freq: Three times a day (TID) | ORAL | 0 refills | Status: DC | PRN

## 2017-01-30 MED ORDER — AMLODIPINE BESYLATE 5 MG PO TABS: 5.0000 mg | ORAL_TABLET | Freq: Every day | ORAL | 5 refills | Status: DC

## 2017-01-30 MED ORDER — IBUPROFEN 600 MG PO TABS: 600.0000 mg | ORAL_TABLET | Freq: Two times a day (BID) | ORAL | 1 refills | Status: DC | PRN

## 2017-01-30 NOTE — Progress Notes (Signed)
Patient is here for HTN  Patient complains of left ear pain presenting two days ago. Pain is described as an ache.  Patient has not taken medication today. Patient has eaten today.

## 2017-01-30 NOTE — Patient Instructions (Signed)

## 2017-01-30 NOTE — Progress Notes (Signed)
Subjective:  Patient ID: Jackie Parker, female    DOB: 10/23/1940  Age: 76 y.o. 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  She has been compliant with all her medications and denies side effects from them. Sees ophthalmology regularly for her glaucoma and is closely followed by Dr Louanne Skye her orthopedic surgeon.  She complains of left ear pain for the last 3 days but points to the junction between her inferior ear and jaw. Pain is worse when she chews and she has not used any over-the-counter medications for this. Denies hearing loss or ear discharge and does not have any sinus symptoms or facial pain. Has also noticed a raspy cough with mucus stuck at the back of her throat. Denies shortness of breath or chest pain.  Past Medical History:  Diagnosis Date  . AKI (acute kidney injury) ([place]) 12/2015  . Allergy    takes Singulair and Zyrtec daily  . Asthma    uses Adair daily  . Cataracts, bilateral   . Colitis, ulcerative ([place])   . DJD (degenerative joint disease)   . Glaucoma    uses eye drops daily  . History of blood transfusion    no abnormal reaction noted. 40+ yrs ago  . History of bronchitis    yrs ago  . History of gout    not on any meds  . Hypertension    takes Amlodipine daily  . Joint pain   . Joint swelling   . Psoriasis   . Urinary frequency    takes Ditropan daily  . Vertigo    doesn't take any meds  . Vitamin D deficiency     Past Surgical History:  Procedure Laterality Date  . BACK SURGERY    . COLONOSCOPY    . ILEOSTOMY    . KNEE ARTHROPLASTY Left 12/14/2015   Procedure: COMPUTER ASSISTED LEFT TOTAL KNEE ARTHROPLASTY;  Surgeon: Jessy Oto, MD;  Location: [facility];   Service: Orthopedics;  Laterality: Left;  . TOTAL KNEE ARTHROPLASTY Left 12/14/2015  . TOTAL KNEE ARTHROPLASTY Right 06/27/2016  . TOTAL KNEE ARTHROPLASTY Right 06/27/2016   Procedure: RIGHT TOTAL KNEE ARTHROPLASTY;  Surgeon: Jessy Oto, MD;  Location: [facility];  Service: Orthopedics;  Laterality: Right;  . TUBAL LIGATION      Allergies  Allergen Reactions  . Sulfonamide Derivatives Rash     Outpatient Medications Prior to Visit  Medication Sig Dispense Refill  . allopurinol (ZYLOPRIM) 100 MG tablet Take 1 tablet (100 mg total) by mouth 2 (two) times daily. 60 tablet 11  . brinzolamide (AZOPT) 1 % ophthalmic suspension Place 1 drop into the right eye 2 (two) times daily.    . chlorpheniramine-HYDROcodone (TUSSIONEX PENNKINETIC ER) 10-8 MG/5ML SUER Take 5 mLs by mouth at bedtime as needed for cough. 60 mL 0  . cholecalciferol (VITAMIN D) 1000 units tablet Take 1,000 Units by mouth daily.    . colchicine 0.6 MG tablet Take 1 tablet (0.6 mg total) by mouth 2 (two) times daily. 5 tablet 1  . gabapentin (NEURONTIN) 100 MG capsule Take 100 mg by mouth daily as needed. For back pain.    Marland Kitchen LUMIGAN 0.01 % SOLN Place 1 drop into both eyes at bedtime.    . prochlorperazine (COMPAZINE)  10 MG tablet Take 10 mg by mouth 2 (two) times daily as needed. For nausea/vomiting.    . traMADol (ULTRAM) 50 MG tablet TAKE ONE-HALF TO 1 TABLET BY MOUTH THREE TIMES DAILY DURING THE DAY AS NEEDED FOR PAIN 90 tablet 0  . albuterol (PROVENTIL HFA;VENTOLIN HFA) 108 (90 Base) MCG/ACT inhaler Inhale 2 puffs into the lungs every 6 (six) hours as needed for wheezing or shortness of breath. 1 Inhaler 1  . amLODipine (NORVASC) 5 MG tablet Take 1 tablet (5 mg total) by mouth daily. 30 tablet 5  . cetirizine (ZYRTEC) 10 MG tablet Take 10 mg by mouth daily.  5  . Pumpkin Seed-Soy Germ (AZO BLADDER CONTROL/GO-LESS PO) Take by mouth.    . solifenacin (VESICARE) 5 MG tablet Take 1 tablet (5 mg total) by mouth daily. (Patient not  taking: Reported on 01/30/2017) 30 tablet 1   No facility-administered medications prior to visit.     ROS Review of Systems  Constitutional: Negative for activity change, appetite change and fatigue.  HENT: Positive for ear pain. Negative for congestion, sinus pressure and sore throat.   Eyes: Negative for visual disturbance.  Respiratory: Positive for cough. Negative for chest tightness, shortness of breath and wheezing.   Cardiovascular: Negative for chest pain and palpitations.  Gastrointestinal: Negative for abdominal distention, abdominal pain and constipation.  Endocrine: Negative for polydipsia.  Genitourinary: Negative for dysuria and frequency.  Musculoskeletal: Negative for arthralgias and back pain.  Skin: Negative for rash.  Neurological: Negative for tremors, light-headedness and numbness.  Hematological: Does not bruise/bleed easily.  Psychiatric/Behavioral: Negative for agitation and behavioral problems.    Objective:  BP 133/74 (BP Location: Left Arm, Patient Position: Sitting, Cuff Size: Normal)   Pulse 92   Temp 97.9 F (36.6 C) (Oral)   Resp 18   Ht 5' 2"  (1.575 m)   Wt 179 lb (81.2 kg)   SpO2 99%   BMI 32.74 kg/m   BP/Weight 01/30/2017 0/10/4095 11/14/3297  Systolic BP 242 683 419  Diastolic BP 74 93 80  Wt. (Lbs) 179 - 175.8  BMI 32.74 - 32.15      Physical Exam Constitutional: She is oriented to person, place, and time. She appears well-developed and well-nourished.  HEENT: Cerumen obscuring right tympanic membrane, left tympanic membrane is normal. Tenderness on palpation of inferior aspect submandibular joint; normal appearance of the jaw. No TMJ tenderness or clicking Cardiovascular: Normal rate, normal heart sounds and intact distal pulses.   No murmur heard. Pulmonary/Chest: Effort normal and breath sounds normal. She has no wheezes. She has no rales. She exhibits no tenderness.  Abdominal: Soft. Bowel sounds are normal. She exhibits no  distension and no mass. There is no tenderness.  Colostomy bag in place  Musculoskeletal: Normal range of motion.  Neurological: She is alert and oriented to person, place, and time.  Psych: Normal  Assessment & Plan:   1. Essential hypertension Controlled - amLODipine (NORVASC) 5 MG tablet; Take 1 tablet (5 mg total) by mouth daily.  Dispense: 30 tablet; Refill: 5  2. Jaw pain, non-TMJ Possibly underlying inflammation; ear exam is normal - ibuprofen (ADVIL,MOTRIN) 600 MG tablet; Take 1 tablet (600 mg total) by mouth every 12 (twelve) hours as needed.  Dispense: 30 tablet; Refill: 1  3. Cough We'll treat for seasonal allergies - benzonatate (TESSALON) 100 MG capsule; Take 1 capsule (100 mg total) by mouth 3 (three) times daily as needed for cough.  Dispense: 21 capsule; Refill: 0 - cetirizine (  ZYRTEC) 10 MG tablet; Take 1 tablet (10 mg total) by mouth daily.  Dispense: 30 tablet; Refill: 1  4. Mild intermittent asthma without complication Stable, no acute exacerbation - albuterol (PROVENTIL HFA;VENTOLIN HFA) 108 (90 Base) MCG/ACT inhaler; Inhale 2 puffs into the lungs every 6 (six) hours as needed for wheezing or shortness of breath.  Dispense: 1 Inhaler; Refill: 1  5. Other glaucoma of both eyes Keep appointment with ophthalmology   Meds ordered this encounter  Medications  . albuterol (PROVENTIL HFA;VENTOLIN HFA) 108 (90 Base) MCG/ACT inhaler    Sig: Inhale 2 puffs into the lungs every 6 (six) hours as needed for wheezing or shortness of breath.    Dispense:  1 Inhaler    Refill:  1  . amLODipine (NORVASC) 5 MG tablet    Sig: Take 1 tablet (5 mg total) by mouth daily.    Dispense:  30 tablet    Refill:  5  . ibuprofen (ADVIL,MOTRIN) 600 MG tablet    Sig: Take 1 tablet (600 mg total) by mouth every 12 (twelve) hours as needed.    Dispense:  30 tablet    Refill:  1  . benzonatate (TESSALON) 100 MG capsule    Sig: Take 1 capsule (100 mg total) by mouth 3 (three) times daily  as needed for cough.    Dispense:  21 capsule    Refill:  0  . cetirizine (ZYRTEC) 10 MG tablet    Sig: Take 1 tablet (10 mg total) by mouth daily.    Dispense:  30 tablet    Refill:  1    Follow-up: Return in about 3 months (around 05/02/2017) for Follow-up: Hypertension.   Arnoldo Morale MD

## 2017-02-04 ENCOUNTER — Emergency Department (HOSPITAL_COMMUNITY)
Admission: EM | Admit: 2017-02-04 | Discharge: 2017-02-04 | Disposition: A | Discharge status: Home / Self Care | Payer: Medicare Other | Attending: Emergency Medicine | Admitting: Emergency Medicine

## 2017-02-04 ENCOUNTER — Emergency Department (HOSPITAL_COMMUNITY): Payer: Medicare Other

## 2017-02-04 ENCOUNTER — Encounter (HOSPITAL_COMMUNITY): Payer: Self-pay

## 2017-02-04 DIAGNOSIS — R1013 Epigastric pain: Secondary | ICD-10-CM | POA: Insufficient documentation

## 2017-02-04 DIAGNOSIS — E876 Hypokalemia: Secondary | ICD-10-CM | POA: Diagnosis not present

## 2017-02-04 DIAGNOSIS — I10 Essential (primary) hypertension: Secondary | ICD-10-CM | POA: Diagnosis not present

## 2017-02-04 DIAGNOSIS — R112 Nausea with vomiting, unspecified: Secondary | ICD-10-CM

## 2017-02-04 DIAGNOSIS — Z79899 Other long term (current) drug therapy: Secondary | ICD-10-CM | POA: Insufficient documentation

## 2017-02-04 DIAGNOSIS — Z96653 Presence of artificial knee joint, bilateral: Secondary | ICD-10-CM | POA: Insufficient documentation

## 2017-02-04 DIAGNOSIS — J45909 Unspecified asthma, uncomplicated: Secondary | ICD-10-CM | POA: Diagnosis not present

## 2017-02-04 LAB — URINALYSIS, ROUTINE W REFLEX MICROSCOPIC
Bacteria, UA: NONE SEEN
Bilirubin Urine: NEGATIVE
Glucose, UA: NEGATIVE mg/dL
Ketones, ur: NEGATIVE mg/dL
Leukocytes, UA: NEGATIVE
Nitrite: NEGATIVE
Protein, ur: NEGATIVE mg/dL
Specific Gravity, Urine: 1.011 (ref 1.005–1.030)
pH: 6 (ref 5.0–8.0)

## 2017-02-04 LAB — COMPREHENSIVE METABOLIC PANEL
ALT: 17 U/L (ref 14–54)
AST: 41 U/L (ref 15–41)
Albumin: 3.2 g/dL — ABNORMAL LOW (ref 3.5–5.0)
Alkaline Phosphatase: 115 U/L (ref 38–126)
Anion gap: 13 (ref 5–15)
BUN: 11 mg/dL (ref 6–20)
CO2: 20 mmol/L — ABNORMAL LOW (ref 22–32)
Calcium: 8.7 mg/dL — ABNORMAL LOW (ref 8.9–10.3)
Chloride: 107 mmol/L (ref 101–111)
Creatinine, Ser: 1.28 mg/dL — ABNORMAL HIGH (ref 0.44–1.00)
GFR calc Af Amer: 46 mL/min — ABNORMAL LOW (ref >60)
GFR calc non Af Amer: 40 mL/min — ABNORMAL LOW (ref >60)
Glucose, Bld: 166 mg/dL — ABNORMAL HIGH (ref 65–99)
Potassium: 2.7 mmol/L — CL (ref 3.5–5.1)
Sodium: 140 mmol/L (ref 135–145)
Total Bilirubin: 1.7 mg/dL — ABNORMAL HIGH (ref 0.3–1.2)
Total Protein: 7.9 g/dL (ref 6.5–8.1)

## 2017-02-04 LAB — CBC
HCT: 42.4 % (ref 36.0–46.0)
Hemoglobin: 14.2 g/dL (ref 12.0–15.0)
MCH: 31.2 pg (ref 26.0–34.0)
MCHC: 33.5 g/dL (ref 30.0–36.0)
MCV: 93.2 fL (ref 78.0–100.0)
Platelets: 173 10*3/uL (ref 150–400)
RBC: 4.55 MIL/uL (ref 3.87–5.11)
RDW: 13.7 % (ref 11.5–15.5)
WBC: 8.1 10*3/uL (ref 4.0–10.5)

## 2017-02-04 LAB — I-STAT TROPONIN, ED
Troponin i, poc: 0 ng/mL (ref 0.00–0.08)
Troponin i, poc: 0.01 ng/mL (ref 0.00–0.08)

## 2017-02-04 LAB — LIPASE, BLOOD: Lipase: 31 U/L (ref 11–51)

## 2017-02-04 MED ORDER — POTASSIUM CHLORIDE 20 MEQ PO PACK
80.0000 meq | PACK | Freq: Once | ORAL | Status: DC
  Filled 2017-02-04: qty 4

## 2017-02-04 MED ORDER — ONDANSETRON 4 MG PO TBDP
4.0000 mg | ORAL_TABLET | Freq: Once | ORAL | Status: AC | PRN
  Administered 2017-02-04: 4 mg via ORAL

## 2017-02-04 MED ORDER — LACTATED RINGERS IV BOLUS (SEPSIS)
1000.0000 mL | Freq: Once | INTRAVENOUS | Status: AC
  Administered 2017-02-04: 1000 mL via INTRAVENOUS

## 2017-02-04 MED ORDER — ONDANSETRON 4 MG PO TBDP: 4.0000 mg | ORAL_TABLET | Freq: Three times a day (TID) | ORAL | 0 refills | Status: DC | PRN

## 2017-02-04 MED ORDER — POTASSIUM CHLORIDE CRYS ER 20 MEQ PO TBCR: 40.0000 meq | EXTENDED_RELEASE_TABLET | Freq: Once | ORAL | Status: DC

## 2017-02-04 MED ORDER — POTASSIUM CHLORIDE CRYS ER 20 MEQ PO TBCR
60.0000 meq | EXTENDED_RELEASE_TABLET | Freq: Once | ORAL | Status: DC
  Filled 2017-02-04: qty 3

## 2017-02-04 MED ORDER — MORPHINE SULFATE (PF) 4 MG/ML IV SOLN
4.0000 mg | Freq: Once | INTRAVENOUS | Status: AC
  Administered 2017-02-04: 4 mg via INTRAVENOUS
  Filled 2017-02-04: qty 1

## 2017-02-04 MED ORDER — POTASSIUM CHLORIDE 20 MEQ PO PACK
40.0000 meq | PACK | ORAL | Status: AC
  Administered 2017-02-04 (×2): 40 meq via ORAL
  Filled 2017-02-04 (×2): qty 2

## 2017-02-04 MED ORDER — MAGNESIUM OXIDE 400 (241.3 MG) MG PO TABS
800.0000 mg | ORAL_TABLET | Freq: Once | ORAL | Status: AC
  Administered 2017-02-04: 800 mg via ORAL
  Filled 2017-02-04: qty 2

## 2017-02-04 MED ORDER — ONDANSETRON HCL 4 MG/2ML IJ SOLN
4.0000 mg | Freq: Once | INTRAMUSCULAR | Status: AC
  Administered 2017-02-04: 4 mg via INTRAVENOUS
  Filled 2017-02-04: qty 2

## 2017-02-04 MED ORDER — ONDANSETRON 4 MG PO TBDP
ORAL_TABLET | ORAL | Status: AC
  Filled 2017-02-04: qty 1

## 2017-02-04 MED ORDER — GI COCKTAIL ~~LOC~~
30.0000 mL | Freq: Once | ORAL | Status: AC
  Administered 2017-02-04: 30 mL via ORAL
  Filled 2017-02-04: qty 30

## 2017-02-04 NOTE — Discharge Instructions (Signed)
We saw you in the ER for the nausea, vomiting, abdominal discomfort. All the results in the ER are normal, labs and imaging. The potassium was low -and it was replaced. We are not sure what is causing your symptoms. It could be food poisoning, or infection. Please take clear liquid diet for the next 2 days, and then advance the diet. Please return to the ER if your symptoms worsen; you have increased pain, fevers, chills, inability to keep any medications down, confusion.

## 2017-02-04 NOTE — ED Notes (Signed)
Pt states she understands instructions home stable with steady gait. With daughter.

## 2017-02-04 NOTE — ED Provider Notes (Signed)
[facility] DEPT Provider Note   CSN: [id number] Arrival date & time: 02/04/17  0208     History   Chief Complaint Chief Complaint  Patient presents with  . Abdominal Pain  . Chest Pain    HPI Jackie Parker is a 76 y.o. female.  HPI Pt comes in with cc of emesis. Pt has hx of HTN, ulcerative colitis. Pt reports that she had fish at 2:30 pm, it didn't taste right, and around 9:00 pm she started having emesis. Pt has had > 5 episodes of emesis. Vomit contained food. No blood, no bile. Pt also had some gas type feeling and bloating. PT also had generalized abd pain. Pt has no diarrhea. She has had BM in her ostomy bag, and it looked normal. Pt's daughter had the same fish and she had diarrhea.  Pt also reports chest pain, but the chest pain is there only when she is retching. Chest pain is substernal. No heart burn type pain.  Past Medical History:  Diagnosis Date  . AKI (acute kidney injury) ([place]) 12/2015  . Allergy    takes Singulair and Zyrtec daily  . Asthma    uses Adair daily  . Cataracts, bilateral   . Colitis, ulcerative ([place])   . DJD (degenerative joint disease)   . Glaucoma    uses eye drops daily  . History of blood transfusion    no abnormal reaction noted. 40+ yrs ago  . History of bronchitis    yrs ago  . History of gout    not on any meds  . Hypertension    takes Amlodipine daily  . Joint pain   . Joint swelling   . Psoriasis   . Urinary frequency    takes Ditropan daily  . Vertigo    doesn't take any meds  . Vitamin D deficiency     Patient Active Problem List   Diagnosis Date Noted  . Asthma 01/30/2017  . Gynecologic exam normal 11/08/2016  . Urinary incontinence 11/08/2016  . Leukocytosis 06/29/2016  . Anemia 06/29/2016  . AKI (acute kidney injury) ([place])   . Primary localized osteoarthritis of right knee 06/27/2016  . Osteoarthritis of left knee 12/14/2015    Class: End Stage  . Osteoarthritis of right knee 12/14/2015  . Acute  renal failure ([place]) 08/30/2013  . 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 N&V (nausea and vomiting) 08/30/2013  . Hypokalemia 08/30/2013  . Postmenopausal bleeding 03/18/2013  . OSTEOARTHRITIS, KNEE, LEFT 03/20/2010  . TRICUSPID REGURGITATION, MILD 03/03/2010  . RENAL INSUFFICIENCY 03/02/2010  . MORBID OBESITY 02/18/2010  . PREMATURE ATRIAL CONTRACTIONS 02/18/2010  . PREMATURE VENTRICULAR CONTRACTIONS 02/18/2010  . CARDIAC MURMUR 02/18/2010  . EDEMA 10/20/2009  . HYPOKALEMIA 07/30/2009  . RIB PAIN, RIGHT SIDED 05/26/2008  . ABDOMINAL PAIN, LEFT LOWER QUADRANT 12/31/2007  . ALLERGIC RHINITIS 06/25/2007  . ABNORMAL BLOOD CHEMISTRY , OTHER 06/25/2007  . UNSPECIFIED VITAMIN D DEFICIENCY 04/30/2007  . Hypocalcemia 04/19/2007  . GLAUCOMA NEC 04/19/2007  . Essential hypertension 04/19/2007  . Asthma 04/19/2007  . Ulcerative colitis ([place]) 04/19/2007  . DISORDER, MENSTRUAL NEC 04/19/2007  . PSORIASIS 04/19/2007  . 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 GOUT NOS 02/21/2006    Past Surgical History:  Procedure Laterality Date  . BACK SURGERY    . COLONOSCOPY    . ILEOSTOMY    . KNEE ARTHROPLASTY Left 12/14/2015   Procedure: COMPUTER ASSISTED LEFT TOTAL KNEE ARTHROPLASTY;  Surgeon: Jessy Oto, MD;  Location[facility]  OR;  Service: Orthopedics;  Laterality: Left;  . TOTAL KNEE ARTHROPLASTY Left 12/14/2015  . TOTAL KNEE ARTHROPLASTY Right 06/27/2016  . TOTAL KNEE ARTHROPLASTY Right 06/27/2016   Procedure: RIGHT TOTAL KNEE ARTHROPLASTY;  Surgeon: Jessy Oto, MD;  Location: [facility];  Service: Orthopedics;  Laterality: Right;  . TUBAL LIGATION      OB History    Gravida Para Term Preterm AB Living   5             SAB TAB Ectopic Multiple Live Births           5       Home Medications    Prior to Admission medications   Medication Sig Start Date End Date Taking? Authorizing Provider  albuterol (PROVENTIL HFA;VENTOLIN HFA) 108 (90 Base) MCG/ACT inhaler Inhale 2 puffs into the  lungs every 6 (six) hours as needed for wheezing or shortness of breath. 01/30/17  Yes Arnoldo Morale, MD  allopurinol (ZYLOPRIM) 100 MG tablet Take 1 tablet (100 mg total) by mouth 2 (two) times daily. Patient taking differently: Take 100 mg by mouth 2 (two) times daily as needed (gout).  12/14/16  Yes Jessy Oto, MD  amLODipine (NORVASC) 5 MG tablet Take 1 tablet (5 mg total) by mouth daily. 01/30/17  Yes Arnoldo Morale, MD  benzonatate (TESSALON) 100 MG capsule Take 1 capsule (100 mg total) by mouth 3 (three) times daily as needed for cough. 01/30/17  Yes Arnoldo Morale, MD  brinzolamide (AZOPT) 1 % ophthalmic suspension Place 1 drop into the right eye 2 (two) times daily.   Yes [provider]  cetirizine (ZYRTEC) 10 MG tablet Take 1 tablet (10 mg total) by mouth daily. 01/30/17  Yes Arnoldo Morale, MD  chlorpheniramine-HYDROcodone (TUSSIONEX PENNKINETIC ER) 10-8 MG/5ML SUER Take 5 mLs by mouth at bedtime as needed for cough. 01/15/17  Yes Amyot, Nicholes Stairs, NP  cholecalciferol (VITAMIN D) 1000 units tablet Take 1,000 Units by mouth daily.   Yes [provider]  colchicine 0.6 MG tablet Take 1 tablet (0.6 mg total) by mouth 2 (two) times daily. Patient taking differently: Take 0.6 mg by mouth 2 (two) times daily as needed (gout).  12/14/16  Yes Jessy Oto, MD  gabapentin (NEURONTIN) 100 MG capsule Take 100 mg by mouth daily as needed. For back pain. 05/19/16  Yes [provider]  ibuprofen (ADVIL,MOTRIN) 600 MG tablet Take 1 tablet (600 mg total) by mouth every 12 (twelve) hours as needed. Patient taking differently: Take 600 mg by mouth every 12 (twelve) hours as needed for moderate pain.  01/30/17  Yes Amao, Enobong, MD  LUMIGAN 0.01 % SOLN Place 1 drop into both eyes at bedtime. 05/27/16  Yes [provider]  prochlorperazine (COMPAZINE) 10 MG tablet Take 10 mg by mouth 2 (two) times daily as needed. For nausea/vomiting. 04/11/16  Yes [provider]  Pumpkin  Seed-Soy Germ (AZO BLADDER CONTROL/GO-LESS PO) Take 1-2 tablets by mouth daily as needed (bladder control).    Yes [provider]  traMADol (ULTRAM) 50 MG tablet TAKE ONE-HALF TO 1 TABLET BY MOUTH THREE TIMES DAILY DURING THE DAY AS NEEDED FOR PAIN 12/19/16  Yes Jessy Oto, MD  ondansetron (ZOFRAN ODT) 4 MG disintegrating tablet Take 1 tablet (4 mg total) by mouth every 8 (eight) hours as needed for nausea or vomiting. 02/04/17   Varney Biles, MD    Family History Family History  Problem Relation Age of Onset  . Hypertension Sister   .  Glaucoma Sister     Social History Social History  Substance Use Topics  . Smoking status: Never Smoker  . Smokeless tobacco: Never Used  . Alcohol use No     Allergies   Sulfonamide derivatives   Review of Systems Review of Systems  Gastrointestinal: Positive for abdominal pain, nausea and vomiting.  All other systems reviewed and are negative.    Physical Exam Updated Vital Signs BP (!) 157/80   Pulse 79   Temp 98.2 F (36.8 C) (Oral)   Resp (!) 22   SpO2 97%   Physical Exam  Constitutional: She is oriented to person, place, and time. She appears well-developed and well-nourished.  HENT:  Head: Normocephalic and atraumatic.  Eyes: EOM are normal. Pupils are equal, round, and reactive to light.  Neck: Neck supple.  Cardiovascular: Normal rate, regular rhythm and normal heart sounds.   No murmur heard. Pulmonary/Chest: Effort normal. No respiratory distress.  Abdominal: Soft. She exhibits no distension. There is tenderness. There is no rebound and no guarding.  Epigastric, mild  Neurological: She is alert and oriented to person, place, and time.  Skin: Skin is warm and dry.  Nursing note and vitals reviewed.    ED Treatments / Results  Labs (all labs ordered are listed, but only abnormal results are displayed) Labs Reviewed  COMPREHENSIVE METABOLIC PANEL - Abnormal; Notable for the following:       Result  Value   Potassium 2.7 (*)    CO2 20 (*)    Glucose, Bld 166 (*)    Creatinine, Ser 1.28 (*)    Calcium 8.7 (*)    Albumin 3.2 (*)    Total Bilirubin 1.7 (*)    GFR calc non Af Amer 40 (*)    GFR calc Af Amer 46 (*)    All other components within normal limits  URINALYSIS, ROUTINE W REFLEX MICROSCOPIC - Abnormal; Notable for the following:    Hgb urine dipstick SMALL (*)    Squamous Epithelial / LPF 0-5 (*)    All other components within normal limits  LIPASE, BLOOD  CBC  I-STAT TROPOININ, ED  I-STAT TROPOININ, ED    EKG  EKG Interpretation  Date/Time:  Saturday Feb 04 2017 02:20:31 EDT Ventricular Rate:  98 PR Interval:  160 QRS Duration: 88 QT Interval:  342 QTC Calculation: 436 R Axis:   -13 Text Interpretation:  Sinus rhythm with Premature atrial complexes with Abberant conduction Left ventricular hypertrophy with repolarization abnormality Abnormal ECG No significant change since last tracing Confirmed by Varney Biles [phone]) on 02/04/2017 5:51:20 AM       Radiology Dg Chest 2 View  Result Date: 02/04/2017 CLINICAL DATA:  Acute onset of nausea and vomiting. Central chest pain. Initial encounter. EXAM: CHEST  2 VIEW COMPARISON:  Chest radiograph performed 06/29/2016 FINDINGS: The lungs are well-aerated. Peribronchial thickening is noted. Increased interstitial markings are nonspecific. Mild left basilar airspace opacity likely reflects atelectasis. There is no evidence of pleural effusion or pneumothorax. The heart is borderline normal in size. No acute osseous abnormalities are seen. IMPRESSION: Peribronchial thickening. Increased interstitial markings noted. Mild left basilar airspace opacity likely reflects atelectasis. Electronically Signed   By: Garald Balding M.D.   On: 02/04/2017 02:42    Procedures Procedures (including critical care time)  Medications Ordered in ED Medications  potassium chloride (KLOR-CON) packet 40 mEq (40 mEq Oral Given 02/04/17 0738)    ondansetron (ZOFRAN-ODT) disintegrating tablet 4 mg (4 mg Oral Given 02/04/17 0216)  lactated ringers bolus 1,000 mL (1,000 mLs Intravenous New Bag/Given 02/04/17 0612)  magnesium oxide (MAG-OX) tablet 800 mg (800 mg Oral Given 02/04/17 0634)  morphine 4 MG/ML injection 4 mg (4 mg Intravenous Given 02/04/17 0613)  gi cocktail (Maalox,Lidocaine,Donnatal) (30 mLs Oral Given 02/04/17 0614)  ondansetron (ZOFRAN) injection 4 mg (4 mg Intravenous Given 02/04/17 7125)     Initial Impression / Assessment and Plan / ED Course  I have reviewed the triage vital signs and the nursing notes.  Pertinent labs & imaging results that were available during my care of the patient were reviewed by me and considered in my medical decision making (see chart for details).  Clinical Course as of Feb 05 848  Sat Feb 04, 2017  2712 Pt reassessed. Pt's VSS and WNL. Pt's cap refill < 3 seconds. Pt has been hydrated in the ER and now passed po challenge. We will discharge with antiemetic. Strict ER return precautions have been discussed and pt will return if she is unable to tolerate fluids and symptoms are getting worse.   [AN]    Clinical Course User Index [AN] Varney Biles, MD    Pt comes in with cc of abd pain, nausea and emesis. Her symptoms appear to be related to the fish she ate, particularly because her daughter also got sick after eating the same meal. Still, we will get badic labs sent out. Pt's abd exam is not peritoneal, which is reassuring. Po challenge started.  Final Clinical Impressions(s) / ED Diagnoses   Final diagnoses:  Non-intractable vomiting with nausea, unspecified vomiting type  Acute epigastric pain  Acute hypokalemia    New Prescriptions New Prescriptions   ONDANSETRON (ZOFRAN ODT) 4 MG DISINTEGRATING TABLET    Take 1 tablet (4 mg total) by mouth every 8 (eight) hours as needed for nausea or vomiting.     Varney Biles, MD 02/04/17 [phone]

## 2017-02-04 NOTE — ED Triage Notes (Signed)
Pt complaining of N/V x 1 day. Pt states 8 episodes of emesis today. Pt states chest pain began this evening. States central chest pain, does not radiate. Pt denies any light headedness or dizziness. Pt denies any diarrhea.

## 2017-03-02 ENCOUNTER — Other Ambulatory Visit: Payer: Self-pay | Admitting: Family Medicine

## 2017-03-02 DIAGNOSIS — R05 Cough: Secondary | ICD-10-CM

## 2017-03-02 DIAGNOSIS — R059 Cough, unspecified: Secondary | ICD-10-CM

## 2017-03-07 IMAGING — CR DG CHEST 2V
2 series · 2 of 2 positions shown · non-contrast
Comparison: Radiograph 05/19/2008

CLINICAL DATA: [id number] ([id number]) - Preoperative testing Pre-op
chest Hypertension/meds Hx of bronchitis

EXAM:
CHEST  2 VIEW

[w chest pa]
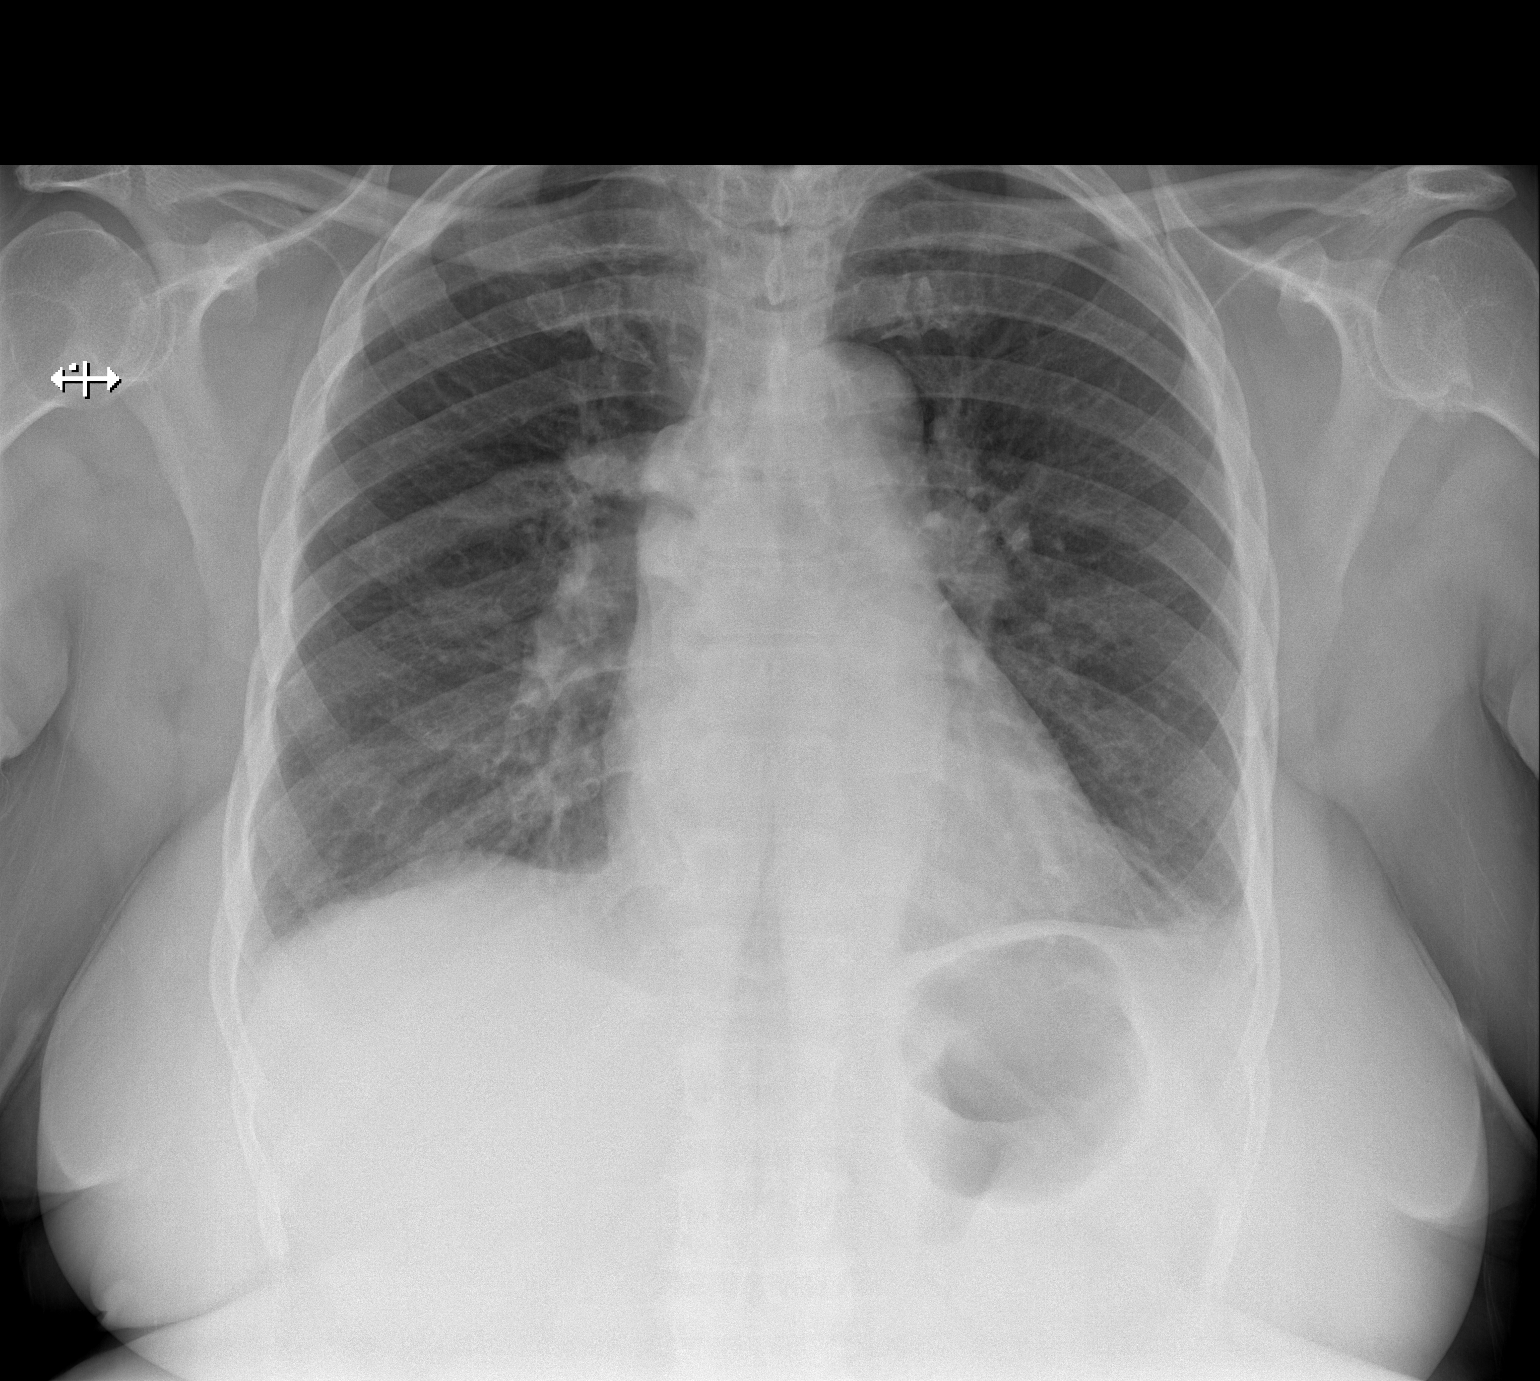

[w chest lat]
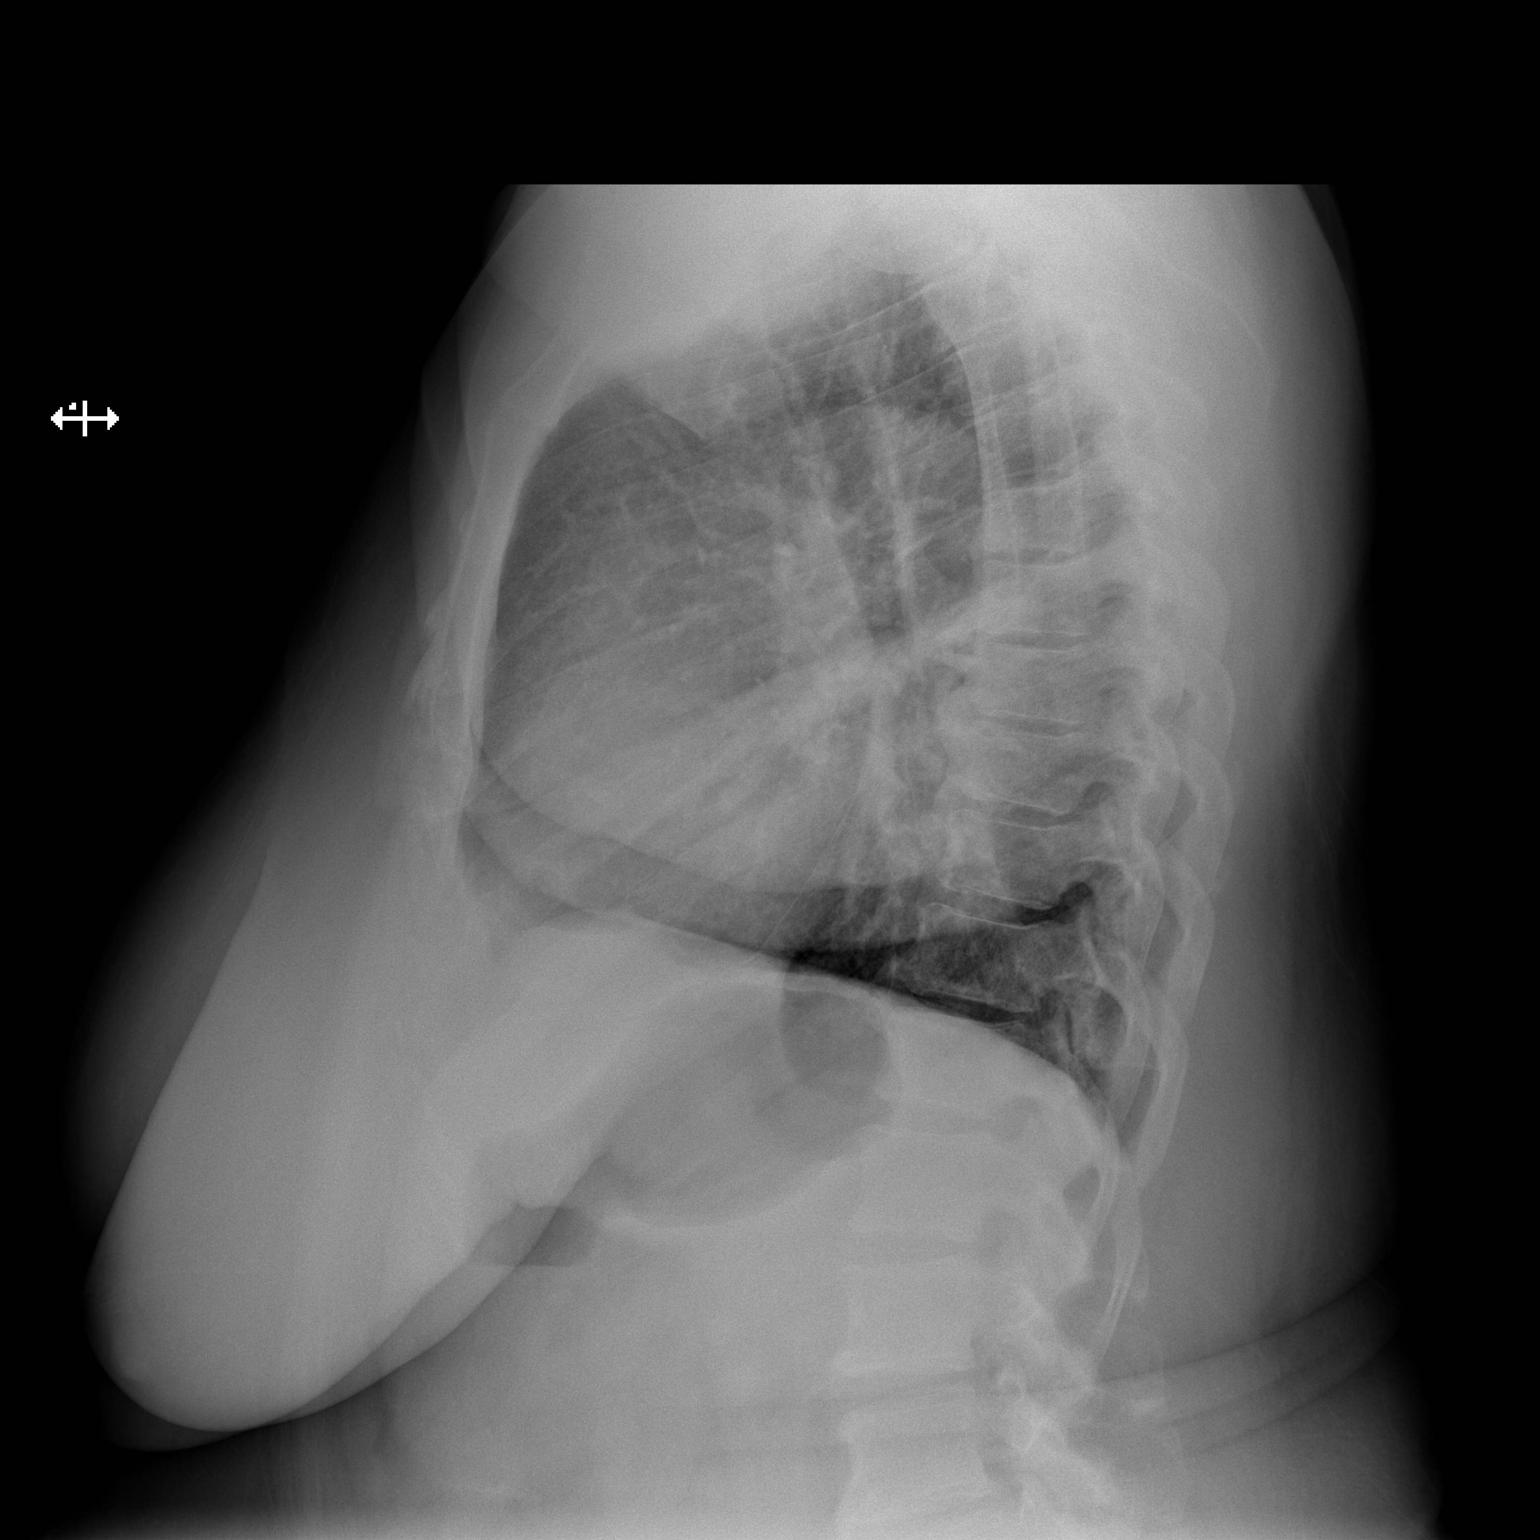

[2 of 2 positions shown; findings below may reference images not displayed]

FINDINGS: Normal mediastinum and cardiac silhouette. Normal pulmonary
vasculature. No evidence of effusion, infiltrate, or pneumothorax.
Trace blunting of the LEFT costophrenic angle. No acute bony
abnormality. Anterior cervical fusion
IMPRESSION: No acute cardiopulmonary process.  Minimal LEFT basilar atelectasis.

Anterior cervical fusion.

## 2017-03-08 ENCOUNTER — Telehealth: Payer: Self-pay | Admitting: Family Medicine

## 2017-03-08 DIAGNOSIS — R059 Cough, unspecified: Secondary | ICD-10-CM

## 2017-03-08 DIAGNOSIS — R05 Cough: Secondary | ICD-10-CM

## 2017-03-08 MED ORDER — BENZONATATE 100 MG PO CAPS: ORAL_CAPSULE | ORAL | 0 refills | Status: DC

## 2017-03-08 NOTE — Telephone Encounter (Signed)
Pt was called and informed of refill.

## 2017-03-08 NOTE — Telephone Encounter (Signed)
Will route to PCP 

## 2017-03-08 NOTE — Telephone Encounter (Signed)
Refilled

## 2017-03-08 NOTE — Telephone Encounter (Signed)
PT called to request a refill for benzonatate (TESSALON) 100 MG capsule  please follow up

## 2017-03-13 ENCOUNTER — Telehealth: Payer: Self-pay | Admitting: Family Medicine

## 2017-03-13 MED ORDER — BECLOMETHASONE DIPROPIONATE 80 MCG/ACT IN AERS: 2.0000 | INHALATION_SPRAY | Freq: Two times a day (BID) | RESPIRATORY_TRACT | 3 refills | Status: DC

## 2017-03-13 NOTE — Telephone Encounter (Signed)
QVAR sent to her pharmacy.

## 2017-03-13 NOTE — Telephone Encounter (Signed)
Patient requesting an alternative for inhaler that provides more relief.

## 2017-03-13 NOTE — Telephone Encounter (Signed)
Good Morning  Dr Jarold Song patient is requesting a stronger inhaler for her asthma Thank you

## 2017-03-16 MED ORDER — FLUTICASONE PROPIONATE HFA 110 MCG/ACT IN AERO: 2.0000 | INHALATION_SPRAY | Freq: Two times a day (BID) | RESPIRATORY_TRACT | 3 refills | Status: DC

## 2017-03-16 NOTE — Telephone Encounter (Signed)
Switched to [employer].

## 2017-03-16 NOTE — Telephone Encounter (Signed)
The inhaler sent to patient's pharmacy is not covered by her insurance, patient would like to know if prescription could be change to something covered under her insurance...  Please advise patient

## 2017-03-16 NOTE — Telephone Encounter (Signed)
Patient with [employer] - looks like Flovent would be preferred over Qvar. Will forward to PCP

## 2017-03-16 NOTE — Addendum Note (Signed)
Addended by: Arnoldo Morale on: 03/16/2017 02:02 PM   Modules accepted: Orders

## 2017-03-17 MED ORDER — FLUTICASONE PROPIONATE HFA 110 MCG/ACT IN AERO: 2.0000 | INHALATION_SPRAY | Freq: Two times a day (BID) | RESPIRATORY_TRACT | 3 refills | Status: DC

## 2017-03-17 NOTE — Telephone Encounter (Signed)
I think Soocent is supposed to be Flovent. [employer] prefers Flovent or Arnuity. Flovent was sent to [facility], not [facility] but I have sent it to [facility].

## 2017-03-17 NOTE — Telephone Encounter (Signed)
Pt. Called stating that there are tow inhalers that her insurance will. Pt. States she has one inhaler but it is not strong enough. Her PCP had sent her anohter inhaler but it is not covered by her insurance. Arnuity and Soocent are two inhalers that are covered by pt. Insurance. Pt. Uses [facility] on [address] .

## 2017-03-30 ENCOUNTER — Encounter: Payer: Self-pay | Admitting: Pharmacist

## 2017-04-04 ENCOUNTER — Other Ambulatory Visit: Payer: Self-pay | Admitting: Family Medicine

## 2017-04-04 DIAGNOSIS — J4520 Mild intermittent asthma, uncomplicated: Secondary | ICD-10-CM

## 2017-05-08 ENCOUNTER — Encounter: Payer: Self-pay | Admitting: Family Medicine

## 2017-05-08 ENCOUNTER — Ambulatory Visit: Payer: Medicare Other | Attending: Family Medicine | Admitting: Family Medicine

## 2017-05-08 VITALS — BP 143/85 | HR 90 | Temp 98.2°F | Ht 62.0 in | Wt 181.6 lb

## 2017-05-08 DIAGNOSIS — J4520 Mild intermittent asthma, uncomplicated: Secondary | ICD-10-CM | POA: Diagnosis not present

## 2017-05-08 DIAGNOSIS — M25561 Pain in right knee: Secondary | ICD-10-CM | POA: Diagnosis not present

## 2017-05-08 DIAGNOSIS — L409 Psoriasis, unspecified: Secondary | ICD-10-CM | POA: Diagnosis not present

## 2017-05-08 DIAGNOSIS — H409 Unspecified glaucoma: Secondary | ICD-10-CM | POA: Insufficient documentation

## 2017-05-08 DIAGNOSIS — M170 Bilateral primary osteoarthritis of knee: Secondary | ICD-10-CM | POA: Diagnosis not present

## 2017-05-08 DIAGNOSIS — Z9851 Tubal ligation status: Secondary | ICD-10-CM | POA: Diagnosis not present

## 2017-05-08 DIAGNOSIS — Z9049 Acquired absence of other specified parts of digestive tract: Secondary | ICD-10-CM | POA: Diagnosis not present

## 2017-05-08 DIAGNOSIS — G8929 Other chronic pain: Secondary | ICD-10-CM | POA: Diagnosis not present

## 2017-05-08 DIAGNOSIS — E876 Hypokalemia: Secondary | ICD-10-CM | POA: Diagnosis not present

## 2017-05-08 DIAGNOSIS — Z882 Allergy status to sulfonamides status: Secondary | ICD-10-CM | POA: Diagnosis not present

## 2017-05-08 DIAGNOSIS — I10 Essential (primary) hypertension: Secondary | ICD-10-CM | POA: Insufficient documentation

## 2017-05-08 DIAGNOSIS — Z932 Ileostomy status: Secondary | ICD-10-CM | POA: Diagnosis present

## 2017-05-08 DIAGNOSIS — Z96653 Presence of artificial knee joint, bilateral: Secondary | ICD-10-CM | POA: Diagnosis not present

## 2017-05-08 DIAGNOSIS — E559 Vitamin D deficiency, unspecified: Secondary | ICD-10-CM | POA: Diagnosis not present

## 2017-05-08 DIAGNOSIS — Z9889 Other specified postprocedural states: Secondary | ICD-10-CM | POA: Insufficient documentation

## 2017-05-08 MED ORDER — ALBUTEROL SULFATE HFA 108 (90 BASE) MCG/ACT IN AERS: INHALATION_SPRAY | RESPIRATORY_TRACT | 1 refills | Status: DC

## 2017-05-08 MED ORDER — AMLODIPINE BESYLATE 5 MG PO TABS: 5.0000 mg | ORAL_TABLET | Freq: Every day | ORAL | 5 refills | Status: DC

## 2017-05-08 NOTE — Progress Notes (Signed)
Subjective:  Patient ID: Jackie Parker, female    DOB: 12-30-1940  Age: 76 y.o. MRN: [id number]  CC: Hypertension   HPI Jackie Parker s a 76 year old female with Medical history significant for hypertension, ulcerative colitis status post total colectomy in 1977, glaucoma bilateral knee osteoarthritis status post bilateral total knee replacement (left in 12/2015, right in 06/2016) here for follow-up visit.  Her blood pressure is elevated today and she endorses compliance with amlodipine, low-sodium diet and states her blood pressures at home have been in the 384 systolic.  She complains of right knee pain uncontrolled on NSAIDs. She has had chronic pain ever since her replacement surgery and is scheduled to see her orthopedics-Dr Louanne Skye in 3 days.  Denies asthma flares. Glaucoma is stable and she is followed closely by [facility] ophthalmology.  Past Medical History:  Diagnosis Date  . AKI (acute kidney injury) ([place]) 12/2015  . Allergy    takes Singulair and Zyrtec daily  . Asthma    uses Adair daily  . Cataracts, bilateral   . Colitis, ulcerative ([place])   . DJD (degenerative joint disease)   . Glaucoma    uses eye drops daily  . History of blood transfusion    no abnormal reaction noted. 40+ yrs ago  . History of bronchitis    yrs ago  . History of gout    not on any meds  . Hypertension    takes Amlodipine daily  . Joint pain   . Joint swelling   . Psoriasis   . Urinary frequency    takes Ditropan daily  . Vertigo    doesn't take any meds  . Vitamin D deficiency     Past Surgical History:  Procedure Laterality Date  . BACK SURGERY    . COLONOSCOPY    . ILEOSTOMY    . KNEE ARTHROPLASTY Left 12/14/2015   Procedure: COMPUTER ASSISTED LEFT TOTAL KNEE ARTHROPLASTY;  Surgeon: Jessy Oto, MD;  Location: [facility];  Service: Orthopedics;  Laterality: Left;  . TOTAL KNEE ARTHROPLASTY Left 12/14/2015  . TOTAL KNEE ARTHROPLASTY Right 06/27/2016  .  TOTAL KNEE ARTHROPLASTY Right 06/27/2016   Procedure: RIGHT TOTAL KNEE ARTHROPLASTY;  Surgeon: Jessy Oto, MD;  Location: [facility];  Service: Orthopedics;  Laterality: Right;  . TUBAL LIGATION      Allergies  Allergen Reactions  . Sulfonamide Derivatives Rash     Outpatient Medications Prior to Visit  Medication Sig Dispense Refill  . benzonatate (TESSALON) 100 MG capsule TAKE 1 CAPSULE(100 MG) BY MOUTH THREE TIMES DAILY AS NEEDED FOR COUGH 21 capsule 0  . brinzolamide (AZOPT) 1 % ophthalmic suspension Place 1 drop into the right eye 2 (two) times daily.    . cholecalciferol (VITAMIN D) 1000 units tablet Take 1,000 Units by mouth daily.    . fluticasone (FLOVENT HFA) 110 MCG/ACT inhaler Inhale 2 puffs into the lungs 2 (two) times daily. 1 Inhaler 3  . ibuprofen (ADVIL,MOTRIN) 600 MG tablet Take 1 tablet (600 mg total) by mouth every 12 (twelve) hours as needed. (Patient taking differently: Take 600 mg by mouth every 12 (twelve) hours as needed for moderate pain. ) 30 tablet 1  . LUMIGAN 0.01 % SOLN Place 1 drop into both eyes at bedtime.    . prochlorperazine (COMPAZINE) 10 MG tablet Take 10 mg by mouth 2 (two) times daily as needed. For nausea/vomiting.    Marland Kitchen amLODipine (NORVASC) 5 MG tablet Take 1 tablet (5 mg total)  by mouth daily. 30 tablet 5  . PROAIR HFA 108 (90 Base) MCG/ACT inhaler INHALE 2 PUFFS INTO THE LUNGS EVERY 6 HOURS AS NEEDED FOR WHEEZING OR SHORTNESS OF BREATH 8.5 g 0  . allopurinol (ZYLOPRIM) 100 MG tablet Take 1 tablet (100 mg total) by mouth 2 (two) times daily. (Patient not taking: Reported on 05/08/2017) 60 tablet 11  . cetirizine (ZYRTEC) 10 MG tablet Take 1 tablet (10 mg total) by mouth daily. (Patient not taking: Reported on 05/08/2017) 30 tablet 1  . colchicine 0.6 MG tablet Take 1 tablet (0.6 mg total) by mouth 2 (two) times daily. (Patient not taking: Reported on 05/08/2017) 5 tablet 1  . gabapentin (NEURONTIN) 100 MG capsule Take 100 mg by mouth daily as needed. For  back pain.    Marland Kitchen ondansetron (ZOFRAN ODT) 4 MG disintegrating tablet Take 1 tablet (4 mg total) by mouth every 8 (eight) hours as needed for nausea or vomiting. (Patient not taking: Reported on 05/08/2017) 20 tablet 0  . Pumpkin Seed-Soy Germ (AZO BLADDER CONTROL/GO-LESS PO) Take 1-2 tablets by mouth daily as needed (bladder control).     . traMADol (ULTRAM) 50 MG tablet TAKE ONE-HALF TO 1 TABLET BY MOUTH THREE TIMES DAILY DURING THE DAY AS NEEDED FOR PAIN (Patient not taking: Reported on 05/08/2017) 90 tablet 0  . chlorpheniramine-HYDROcodone (TUSSIONEX PENNKINETIC ER) 10-8 MG/5ML SUER Take 5 mLs by mouth at bedtime as needed for cough. (Patient not taking: Reported on 05/08/2017) 60 mL 0   No facility-administered medications prior to visit.     ROS Review of Systems  Constitutional: Negative for activity change, appetite change and fatigue.  HENT: Negative for congestion, sinus pressure and sore throat.   Eyes: Negative for visual disturbance.  Respiratory: Negative for cough, chest tightness, shortness of breath and wheezing.   Cardiovascular: Negative for chest pain and palpitations.  Gastrointestinal: Negative for abdominal distention, abdominal pain and constipation.  Endocrine: Negative for polydipsia.  Genitourinary: Negative for dysuria and frequency.  Musculoskeletal:       See hpi  Skin: Negative for rash.  Neurological: Negative for tremors, light-headedness and numbness.  Hematological: Does not bruise/bleed easily.  Psychiatric/Behavioral: Negative for agitation and behavioral problems.    Objective:  BP (!) 143/85   Pulse 90   Temp 98.2 F (36.8 C) (Oral)   Ht 5' 2"  (1.575 m)   Wt 181 lb 9.6 oz (82.4 kg)   SpO2 98%   BMI 33.22 kg/m   BP/Weight 05/08/2017 02/04/2017 3/55/7322  Systolic BP 025 427 062  Diastolic BP 85 80 74  Wt. (Lbs) 181.6 - 179  BMI 33.22 - 32.74      Physical Exam  Constitutional: She is oriented to person, place, and time. She appears  well-developed and well-nourished.  Cardiovascular: Normal rate, normal heart sounds and intact distal pulses.   No murmur heard. Pulmonary/Chest: Effort normal and breath sounds normal. She has no wheezes. She has no rales. She exhibits no tenderness.  Abdominal: Soft. Bowel sounds are normal. She exhibits no distension and no mass. There is no tenderness.  Musculoskeletal:  Healed vertical surgical scars bilaterally And crepitus on range of motion with associated tenderness in right knee  Neurological: She is alert and oriented to person, place, and time.  Skin: Skin is warm and dry.  Psychiatric: She has a normal mood and affect.     Assessment & Plan:   1. Mild intermittent asthma without complication Stable No acute flares - albuterol (PROAIR HFA)  108 (90 Base) MCG/ACT inhaler; INHALE 2 PUFFS INTO THE LUNGS EVERY 6 HOURS AS NEEDED FOR WHEEZING OR SHORTNESS OF BREATH  Dispense: 8.5 g; Refill: 1  2. Essential hypertension Uncontrolled She has been compliant with amlodipine We will make no regimen changes as she reports blood pressures at home have been controlled Low-sodium diet Increase amlodipine dose at next visit if still elevated - amLODipine (NORVASC) 5 MG tablet; Take 1 tablet (5 mg total) by mouth daily.  Dispense: 30 tablet; Refill: 5  3. Hypokalemia Last potassium was 2.7 Repeat today - Basic Metabolic Panel  4. Chronic pain of right knee Currently taking NSAIDs Scheduled to see orthopedic in 3 days.   Meds ordered this encounter  Medications  . albuterol (PROAIR HFA) 108 (90 Base) MCG/ACT inhaler    Sig: INHALE 2 PUFFS INTO THE LUNGS EVERY 6 HOURS AS NEEDED FOR WHEEZING OR SHORTNESS OF BREATH    Dispense:  8.5 g    Refill:  1  . amLODipine (NORVASC) 5 MG tablet    Sig: Take 1 tablet (5 mg total) by mouth daily.    Dispense:  30 tablet    Refill:  5    Follow-up: Return in about 6 months (around 11/08/2017) for Follow-up of hypertension.   Arnoldo Morale  MD

## 2017-05-08 NOTE — Patient Instructions (Signed)

## 2017-05-09 LAB — BASIC METABOLIC PANEL
BUN/Creatinine Ratio: 11 — ABNORMAL LOW (ref 12–28)
BUN: 13 mg/dL (ref 8–27)
CO2: 16 mmol/L — ABNORMAL LOW (ref 20–29)
Calcium: 8.9 mg/dL (ref 8.7–10.3)
Chloride: 109 mmol/L — ABNORMAL HIGH (ref 96–106)
Creatinine, Ser: 1.15 mg/dL — ABNORMAL HIGH (ref 0.57–1.00)
GFR calc Af Amer: 54 mL/min/{1.73_m2} — ABNORMAL LOW (ref >59)
GFR calc non Af Amer: 47 mL/min/{1.73_m2} — ABNORMAL LOW (ref >59)
Glucose: 76 mg/dL (ref 65–99)
Potassium: 4.8 mmol/L (ref 3.5–5.2)
Sodium: 144 mmol/L (ref 134–144)

## 2017-05-10 ENCOUNTER — Telehealth: Payer: Self-pay

## 2017-05-10 NOTE — Telephone Encounter (Signed)
Pt was called and informed of lab results. 

## 2017-05-10 NOTE — Telephone Encounter (Signed)
Patient called back to review results, pt was informed of normal results and had no questions

## 2017-05-11 ENCOUNTER — Encounter (INDEPENDENT_AMBULATORY_CARE_PROVIDER_SITE_OTHER): Payer: Self-pay | Admitting: Specialist

## 2017-05-11 ENCOUNTER — Ambulatory Visit (INDEPENDENT_AMBULATORY_CARE_PROVIDER_SITE_OTHER): Payer: Medicare Other | Admitting: Specialist

## 2017-05-11 VITALS — BP 137/77 | HR 72 | Ht 62.0 in | Wt 169.0 lb

## 2017-05-11 DIAGNOSIS — M25562 Pain in left knee: Secondary | ICD-10-CM | POA: Diagnosis not present

## 2017-05-11 DIAGNOSIS — Z8739 Personal history of other diseases of the musculoskeletal system and connective tissue: Secondary | ICD-10-CM | POA: Diagnosis not present

## 2017-05-11 DIAGNOSIS — G8929 Other chronic pain: Secondary | ICD-10-CM | POA: Diagnosis not present

## 2017-05-11 DIAGNOSIS — Z96653 Presence of artificial knee joint, bilateral: Secondary | ICD-10-CM

## 2017-05-11 DIAGNOSIS — M25561 Pain in right knee: Secondary | ICD-10-CM | POA: Diagnosis not present

## 2017-05-11 MED ORDER — INDOMETHACIN 25 MG PO CAPS: 25.0000 mg | ORAL_CAPSULE | Freq: Two times a day (BID) | ORAL | Status: DC

## 2017-05-11 NOTE — Patient Instructions (Addendum)
  Knee is suffering from patellofemoral stress and muscle weakness only real proven treatments are Weight loss, NSIADs like indocin and exercise. Well padded shoes help. Ice the knee 2-3 times a day 15-20 mins at a time. Stop motrin, start indocin 42m twice a day with meal or snack.

## 2017-05-11 NOTE — Progress Notes (Signed)
Office Visit Note   Patient: Jackie Parker           Date of Birth: July 20, 1941           MRN: [id number] Visit Date: 05/11/2017              Requested by: Arnoldo Morale, MD [place], [place] [address] PCP: Arnoldo Morale, MD   Assessment & Plan: Visit Diagnoses:  1. 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 Bilateral anterior knee pain   3. History of acute gouty arthritis    Plan: Knee is suffering from patellofemoral stress and muscle weakness only real proven treatments are Weight loss, NSIADs like indocin and exercise. Well padded shoes help. Ice the knee 2-3 times a day 15-20 mins at a time. Stop motrin, start indocin 67m twice a day with meal or snack.  Follow-Up Instructions: Return in about 6 weeks (around 06/22/2017).   Orders:  No orders of the defined types were placed in this encounter.  No orders of the defined types were placed in this encounter.     Procedures: No procedures performed   Clinical Data: No additional findings.   Subjective: Chief Complaint  Patient presents with  . Right Knee - Pain  . Left Knee - Pain    HPI  Review of Systems   Objective: Vital Signs: BP 137/77 (BP Location: Left Arm, Patient Position: Sitting)   Pulse 72   Ht 5' 2"  (1.575 m)   Wt 169 lb (76.7 kg)   BMI 30.91 kg/m   Physical Exam  Ortho Exam  Specialty Comments:  No specialty comments available.  Imaging: No results found.   PMFS History: Patient Active Problem List   Diagnosis Date Noted  . Osteoarthritis of left knee 12/14/2015    Priority: High    Class: End Stage  . Asthma 01/30/2017  . Gynecologic exam normal 11/08/2016  . Urinary incontinence 11/08/2016  . Leukocytosis 06/29/2016  . Anemia 06/29/2016  . AKI (acute kidney injury) ([place]   . Primary localized osteoarthritis of right knee 06/27/2016  . Osteoarthritis of right knee 12/14/2015  . Acute renal failure ([place] 08/30/2013  .  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 N&V (nausea and vomiting) 08/30/2013  . Hypokalemia 08/30/2013  . Postmenopausal bleeding 03/18/2013  . OSTEOARTHRITIS, KNEE, LEFT 03/20/2010  . TRICUSPID REGURGITATION, MILD 03/03/2010  . RENAL INSUFFICIENCY 03/02/2010  . MORBID OBESITY 02/18/2010  . PREMATURE ATRIAL CONTRACTIONS 02/18/2010  . PREMATURE VENTRICULAR CONTRACTIONS 02/18/2010  . CARDIAC MURMUR 02/18/2010  . EDEMA 10/20/2009  . HYPOKALEMIA 07/30/2009  . RIB PAIN, RIGHT SIDED 05/26/2008  . ABDOMINAL PAIN, LEFT LOWER QUADRANT 12/31/2007  . ALLERGIC RHINITIS 06/25/2007  . ABNORMAL BLOOD CHEMISTRY , OTHER 06/25/2007  . UNSPECIFIED VITAMIN D DEFICIENCY 04/30/2007  . Hypocalcemia 04/19/2007  . GLAUCOMA NEC 04/19/2007  . Essential hypertension 04/19/2007  . Asthma 04/19/2007  . Ulcerative colitis ([place] 04/19/2007  . DISORDER, MENSTRUAL NEC 04/19/2007  . PSORIASIS 04/19/2007  . 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 GOUT NOS 02/21/2006   Past Medical History:  Diagnosis Date  . AKI (acute kidney injury) ([place] 12/2015  . Allergy    takes Singulair and Zyrtec daily  . Asthma    uses Adair daily  . Cataracts, bilateral   . Colitis, ulcerative ([place]   . DJD (degenerative joint disease)   . Glaucoma    uses eye drops daily  . History of blood transfusion  no abnormal reaction noted. 40+ yrs ago  . History of bronchitis    yrs ago  . History of gout    not on any meds  . Hypertension    takes Amlodipine daily  . Joint pain   . Joint swelling   . Psoriasis   . Urinary frequency    takes Ditropan daily  . Vertigo    doesn't take any meds  . Vitamin D deficiency     Family History  Problem Relation Age of Onset  . Hypertension Sister   . Glaucoma Sister     Past Surgical History:  Procedure Laterality Date  . BACK SURGERY    . COLONOSCOPY    . ILEOSTOMY    . KNEE ARTHROPLASTY Left 12/14/2015   Procedure: COMPUTER ASSISTED LEFT TOTAL KNEE ARTHROPLASTY;   Surgeon: Jessy Oto, MD;  Location: [place];  Service: Orthopedics;  Laterality: Left;  . TOTAL KNEE ARTHROPLASTY Left 12/14/2015  . TOTAL KNEE ARTHROPLASTY Right 06/27/2016  . TOTAL KNEE ARTHROPLASTY Right 06/27/2016   Procedure: RIGHT TOTAL KNEE ARTHROPLASTY;  Surgeon: Jessy Oto, MD;  Location: [facility];  Service: Orthopedics;  Laterality: Right;  . TUBAL LIGATION     Social History   Occupational History  . Not on file.   Social History Main Topics  . Smoking status: Never Smoker  . Smokeless tobacco: Never Used  . Alcohol use No  . Drug use: No  . Sexual activity: No

## 2017-05-17 ENCOUNTER — Telehealth (INDEPENDENT_AMBULATORY_CARE_PROVIDER_SITE_OTHER): Payer: Self-pay | Admitting: Specialist

## 2017-05-17 NOTE — Telephone Encounter (Signed)
Patient called asking for a refill on indomethacin. CB# [phone]

## 2017-05-17 NOTE — Telephone Encounter (Signed)
Patient called asking for a refill on indomethacin.

## 2017-05-22 ENCOUNTER — Other Ambulatory Visit (INDEPENDENT_AMBULATORY_CARE_PROVIDER_SITE_OTHER): Payer: Self-pay | Admitting: Specialist

## 2017-05-22 MED ORDER — TRAMADOL HCL 50 MG PO TABS
ORAL_TABLET | ORAL | 0 refills | Status: DC
Start: 2017-05-22 — End: 2018-08-13

## 2017-05-22 NOTE — Telephone Encounter (Signed)
Her most recent Creatinine was slightly elevated suggesting early kidney irritation and the indocin should be discontinued. I wrote for tramadol 1/2 - 1 up to 3 times per day. She should avoid arthritis meds as they potentially may  Injure her kidneys. jen

## 2017-05-23 ENCOUNTER — Ambulatory Visit: Payer: Medicare Other | Attending: Specialist | Admitting: Physical Therapy

## 2017-05-23 DIAGNOSIS — M25662 Stiffness of left knee, not elsewhere classified: Secondary | ICD-10-CM | POA: Insufficient documentation

## 2017-05-23 DIAGNOSIS — M25562 Pain in left knee: Secondary | ICD-10-CM | POA: Diagnosis present

## 2017-05-23 DIAGNOSIS — M25661 Stiffness of right knee, not elsewhere classified: Secondary | ICD-10-CM | POA: Diagnosis present

## 2017-05-23 DIAGNOSIS — M25561 Pain in right knee: Secondary | ICD-10-CM

## 2017-05-23 DIAGNOSIS — G8929 Other chronic pain: Secondary | ICD-10-CM | POA: Insufficient documentation

## 2017-05-23 DIAGNOSIS — M6281 Muscle weakness (generalized): Secondary | ICD-10-CM | POA: Insufficient documentation

## 2017-05-23 DIAGNOSIS — R262 Difficulty in walking, not elsewhere classified: Secondary | ICD-10-CM | POA: Insufficient documentation

## 2017-05-23 NOTE — Telephone Encounter (Signed)
I called and lmom advising of the rx change

## 2017-05-23 NOTE — Therapy (Signed)
[place], [place], [address] Phone: [phone]   Fax:  [phone]  Physical Therapy Evaluation  Patient Details  Name: Jackie Parker MRN: [id number] Date of Birth: 1941/04/04 Referring Provider: Dr. Louanne Skye   Encounter Date: 05/23/2017      PT End of Session - 05/23/17 1602    Visit Number 1   Number of Visits 8   Date for PT Re-Evaluation 06/23/17   PT Start Time 5462   PT Stop Time 1500   PT Time Calculation (min) 45 min   Activity Tolerance Patient tolerated treatment well   Behavior During Therapy [facility] for tasks assessed/performed      Past Medical History:  Diagnosis Date  . AKI (acute kidney injury) ([place]) 12/2015  . Allergy    takes Singulair and Zyrtec daily  . Asthma    uses Adair daily  . Cataracts, bilateral   . Colitis, ulcerative ([place])   . DJD (degenerative joint disease)   . Glaucoma    uses eye drops daily  . History of blood transfusion    no abnormal reaction noted. 40+ yrs ago  . History of bronchitis    yrs ago  . History of gout    not on any meds  . Hypertension    takes Amlodipine daily  . Joint pain   . Joint swelling   . Psoriasis   . Urinary frequency    takes Ditropan daily  . Vertigo    doesn't take any meds  . Vitamin D deficiency     Past Surgical History:  Procedure Laterality Date  . BACK SURGERY    . COLONOSCOPY    . ILEOSTOMY    . KNEE ARTHROPLASTY Left 12/14/2015   Procedure: COMPUTER ASSISTED LEFT TOTAL KNEE ARTHROPLASTY;  Surgeon: Jessy Oto, MD;  Location: [place];  Service: Orthopedics;  Laterality: Left;  . TOTAL KNEE ARTHROPLASTY Left 12/14/2015  . TOTAL KNEE ARTHROPLASTY Right 06/27/2016  . TOTAL KNEE ARTHROPLASTY Right 06/27/2016   Procedure: RIGHT TOTAL KNEE ARTHROPLASTY;  Surgeon: Jessy Oto, MD;  Location: [place];  Service: Orthopedics;  Laterality: Right;  . TUBAL LIGATION      There were no vitals filed for this visit.        Subjective Assessment - 05/23/17 1429    Subjective Pt has pain which interferes with comfort during mobility and at rest.  She is uncomfortable mostly at night, pain wakes her up.  She has min difficulty walking, rides her stationary bike and walks daily >30 min.  She has bene to PT before and requests a few visits to see if she can improve mobility and flexibility.    Pertinent History Rt. TKA 06/2015, Lt TKA 12/13/16, glaucoma and HTN   Limitations Sitting;Standing;Walking;House hold activities;Other (comment)  sleep    How long can you sit comfortably? 30 min    How long can you stand comfortably? 30 min is difficult, also has back pain   How long can you walk comfortably? 30 min    Diagnostic tests XR bilaterally no report available   Patient Stated Goals Pt would like less knee pain at night    Currently in Pain? No/denies            [facility] PT Assessment - 05/23/17 0001      Assessment   Medical Diagnosis bilateral knee pain, anterolateral PF pain    Referring Provider Dr. 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   Leisure travel      Cognition   Overall Cognitive Status Within Functional Limits for tasks assessed     Observation/Other Assessments   Focus on Therapeutic Outcomes (FOTO)  66%     Circumferential Edema   Circumferential - Right 17 5/8 inch    Circumferential - Left  17 inch      Sensation   Light Touch Appears Intact     Functional Tests   Functional  tests Squat;Single leg stance     Squat   Comments WFL small ROM      Single Leg Stance   Comments <5 sec      Posture/Postural Control   Posture/Postural Control No significant limitations     AROM   Right Knee Extension -10   Right Knee Flexion 106   Left Knee Extension -6   Left Knee Flexion 116     Strength   Right Hip ABduction 4/5   Left Hip ABduction 4/5   Right/Left Knee --  5/5 bilateral     Flexibility   Hamstrings tight      Palpation   Patella mobility hypomobile, L patella more superior      Transfers   Five time sit to stand comments  18 sec mild ache      Ambulation/Gait   Ambulation Distance (Feet) 300 Feet   Assistive device None   Gait Pattern Right flexed knee in stance;Left flexed knee in stance;Antalgic   Ambulation Surface Level;Indoor   Stairs Yes   Stairs Assistance 6: Modified independent (Device/Increase time)   Stair Management Technique No rails;One rail Right;Alternating pattern   Number of Stairs 12   Height of Stairs --  4, 8 inch            Objective measurements completed on examination: See above findings.          [facility] Adult PT Treatment/Exercise - 05/23/17 0001      Knee/Hip Exercises: Stretches   Active Hamstring Stretch Both;2 reps     Knee/Hip Exercises: Supine   Straight Leg Raises Strengthening;Both;1 set;10 reps   Patellar Mobs gentle                 PT Education - 05/23/17 1602    Education provided Yes   Education Details PT/POC, HEP, review, gait, stairs, hip strength    Person(s) Educated Patient   Methods Explanation;Handout   Comprehension Verbalized understanding;Returned demonstration;Need further instruction             PT Long Term Goals - 05/23/17 1610      PT LONG TERM GOAL #1   Title Pt will be able to report 25% less pain at night when she trying to rest, sleep    Time 4   Period Weeks   Status New   [place] Date 06/23/17  PT LONG TERM GOAL #2   Title Pt will  be able to walk for 30 min in the store without increased knee pain    Time 4   Period Weeks   Status New   Target Date 06/23/17     PT LONG TERM GOAL #3   Title Pt will demonstrate no mroe than 8 degrees of bilateral knee extension for proper gait , posture.    Time 4   Period Weeks   Status New   Target Date 06/23/17     PT LONG TERM GOAL #4   Title pt will increase her FOTO score to <50% limited to dmeo improvement in function.    Time 4   Period Weeks   Status New   Target Date 06/23/17                Plan - 06-10-17 1603    Clinical Impression Statement Pt with low complexity eval of chronic ongoing knee pain.  She reports she is not limited in what she can do physically but deals with intermittent pain, mostly at night.  She is active but has gotten away from targeted strengthening for her knees and hips.  She would like to come for 1 month, will work towards improving knee ROM and maximizing functional movement patterns.    Clinical Presentation Stable   Clinical Decision Making Low   Rehab Potential Excellent   PT Frequency 2x / week   PT Duration 4 weeks   PT Treatment/Interventions ADLs/Self Care Home Management;Cryotherapy;Electrical Stimulation;Moist Heat;Gait training;Functional mobility training;Therapeutic activities;Therapeutic exercise;Manual techniques;Passive range of motion;Vasopneumatic Device;Taping;Patient/family education   PT Next Visit Plan check HEP, manual to patella amd lateral knee, bike, NuStep, ROM    PT Home Exercise Plan SLR, hamstring stretch and abd in sidelying   Consulted and Agree with Plan of Care Patient      Patient will benefit from skilled therapeutic intervention in order to improve the following deficits and impairments:  Abnormal gait, Increased fascial restricitons, Pain, Decreased mobility, Obesity, Increased edema, Impaired flexibility, Difficulty walking, Decreased strength, Decreased range of motion, Hypomobility  Visit  Diagnosis: Right knee pain, unspecified chronicity  Difficulty in walking, not elsewhere classified  Stiffness of right knee, not elsewhere classified      G-Codes - 06/10/2017 1608    Functional Assessment Tool Used (Outpatient Only) FOTO   Functional Limitation Mobility: Walking and moving around   Mobility: Walking and Moving Around Current Status [phone]) At least 60 percent but less than 80 percent impaired, limited or restricted   Mobility: Walking and Moving Around Goal Status [phone]) At least 40 percent but less than 60 percent impaired, limited or restricted       Problem List Patient Active Problem List   Diagnosis Date Noted  . Asthma 01/30/2017  . Gynecologic exam normal 11/08/2016  . Urinary incontinence 11/08/2016  . Leukocytosis 06/29/2016  . Anemia 06/29/2016  . AKI (acute kidney injury) ([place])   . Primary localized osteoarthritis of right knee 06/27/2016  . Osteoarthritis of left knee 12/14/2015    Class: End Stage  . Osteoarthritis of right knee 12/14/2015  . Acute renal failure ([place]) 08/30/2013  . 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 N&V (nausea and vomiting) 08/30/2013  . Hypokalemia 08/30/2013  . Postmenopausal bleeding 03/18/2013  . OSTEOARTHRITIS, KNEE, LEFT 03/20/2010  . TRICUSPID REGURGITATION, MILD 03/03/2010  . RENAL INSUFFICIENCY 03/02/2010  . MORBID OBESITY 02/18/2010  . PREMATURE ATRIAL CONTRACTIONS 02/18/2010  . PREMATURE VENTRICULAR  CONTRACTIONS 02/18/2010  . CARDIAC MURMUR 02/18/2010  . EDEMA 10/20/2009  . HYPOKALEMIA 07/30/2009  . RIB PAIN, RIGHT SIDED 05/26/2008  . ABDOMINAL PAIN, LEFT LOWER QUADRANT 12/31/2007  . ALLERGIC RHINITIS 06/25/2007  . ABNORMAL BLOOD CHEMISTRY , OTHER 06/25/2007  . UNSPECIFIED VITAMIN D DEFICIENCY 04/30/2007  . Hypocalcemia 04/19/2007  . GLAUCOMA NEC 04/19/2007  . Essential hypertension 04/19/2007  . Asthma 04/19/2007  . Ulcerative colitis ([place]) 04/19/2007  . DISORDER, MENSTRUAL NEC 04/19/2007  .  PSORIASIS 04/19/2007  . 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 GOUT NOS 02/21/2006    Sande Pickert 05/23/2017, 4:15 PM  [facility] [address] [place], [place], [address] Phone: [phone]   Fax:  [phone]  Name: Jackie Parker MRN: [id number] Date of Birth: 1941/03/14  Raeford Razor, PT 05/23/17 4:15 PM Phone: [phone] Fax: [phone]

## 2017-06-06 ENCOUNTER — Ambulatory Visit: Payer: Medicare Other | Admitting: Physical Therapy

## 2017-06-06 DIAGNOSIS — M6281 Muscle weakness (generalized): Secondary | ICD-10-CM

## 2017-06-06 DIAGNOSIS — M25662 Stiffness of left knee, not elsewhere classified: Secondary | ICD-10-CM

## 2017-06-06 DIAGNOSIS — M25561 Pain in right knee: Secondary | ICD-10-CM

## 2017-06-06 DIAGNOSIS — R262 Difficulty in walking, not elsewhere classified: Secondary | ICD-10-CM

## 2017-06-06 DIAGNOSIS — M25661 Stiffness of right knee, not elsewhere classified: Secondary | ICD-10-CM

## 2017-06-07 NOTE — Therapy (Signed)
[place], [place], [address] Phone: [phone]   Fax:  [phone]  Physical Therapy Treatment  Patient Details  Name: Jackie Parker MRN: [id number] Date of Birth: [birth year] Referring Provider: Dr. Louanne Skye   Encounter Date: 06/06/2017      PT End of Session - 06/06/17 1508    Visit Number 2   Number of Visits 8   Date for PT Re-Evaluation 06/23/17   PT Start Time 0300   PT Stop Time 0345   PT Time Calculation (min) 45 min      Past Medical History:  Diagnosis Date  . AKI (acute kidney injury) ([place]) 12/2015  . Allergy    takes Singulair and Zyrtec daily  . Asthma    uses Adair daily  . Cataracts, bilateral   . Colitis, ulcerative ([place])   . DJD (degenerative joint disease)   . Glaucoma    uses eye drops daily  . History of blood transfusion    no abnormal reaction noted. 40+ yrs ago  . History of bronchitis    yrs ago  . History of gout    not on any meds  . Hypertension    takes Amlodipine daily  . Joint pain   . Joint swelling   . Psoriasis   . Urinary frequency    takes Ditropan daily  . Vertigo    doesn't take any meds  . Vitamin D deficiency     Past Surgical History:  Procedure Laterality Date  . BACK SURGERY    . COLONOSCOPY    . ILEOSTOMY    . KNEE ARTHROPLASTY Left 12/14/2015   Procedure: COMPUTER ASSISTED LEFT TOTAL KNEE ARTHROPLASTY;  Surgeon: Jessy Oto, MD;  Location: [place];  Service: Orthopedics;  Laterality: Left;  . TOTAL KNEE ARTHROPLASTY Left 12/14/2015  . TOTAL KNEE ARTHROPLASTY Right 06/27/2016  . TOTAL KNEE ARTHROPLASTY Right 06/27/2016   Procedure: RIGHT TOTAL KNEE ARTHROPLASTY;  Surgeon: Jessy Oto, MD;  Location: [place];  Service: Orthopedics;  Laterality: Right;  . TUBAL LIGATION      There were no vitals filed for this visit.      Subjective Assessment - 06/06/17 1505    Subjective I am about the same. I walk 3 x per week.    Currently in  Pain? Yes   Pain Score 5    Pain Location Knee   Pain Orientation Right   Pain Descriptors / Indicators Aching;Sore;Tightness   Aggravating Factors  sleep positons, first get up from sitting,    Pain Relieving Factors walking, keeping moving.    Multiple Pain Sites Yes   Pain Score 5   Pain Location Knee   Pain Orientation Left   Pain Descriptors / Indicators Aching;Sore   Aggravating Factors  sleep positions, first get up from sitting    Pain Relieving Factors walking, keep moving                          [facility] Adult PT Treatment/Exercise - 06/07/17 0001      Knee/Hip Exercises: Stretches   Active Hamstring Stretch Limitations seated with foot on stool x 1 minute each, doing quad sets   bilateral     Knee/Hip Exercises: Aerobic   Nustep L3 x 5 minutes      Knee/Hip Exercises: Standing   Knee Flexion 2 sets;10 reps   Hip Abduction 2 sets;10 reps   Forward Step Up 10 reps;Step  Height: 6";Hand Hold: 1     Knee/Hip Exercises: Seated   Sit to Sand 10 reps;without UE support     Knee/Hip Exercises: Supine   Straight Leg Raises Strengthening;Both;1 set;10 reps                PT Education - 06/07/17 0714    Education provided Yes   Education Details HEP verbal   Person(s) Educated Patient   Methods Explanation   Comprehension Verbalized understanding             PT Long Term Goals - 05/23/17 1610      PT LONG TERM GOAL #1   Title Pt will be able to report 25% less pain at night when she trying to rest, sleep    Time 4   Period Weeks   Status New   [place] Date 06/23/17     PT LONG TERM GOAL #2   Title Pt will be able to walk for 30 min in the store without increased knee pain    Time 4   Period Weeks   Status New   [place] Date 06/23/17     PT LONG TERM GOAL #3   Title Pt will demonstrate no mroe than 8 degrees of bilateral knee extension for proper gait , posture.    Time 4   Period Weeks   Status New   Target Date 06/23/17      PT LONG TERM GOAL #4   Title pt will increase her FOTO score to <50% limited to dmeo improvement in function.    Time 4   Period Weeks   Status New   Target Date 06/23/17               Plan - 06/06/17 0713    Clinical Impression Statement Added closed chain today with good tolerance. Less pain with sit to stand transfers at end of session.    PT Next Visit Plan check HEP, manual to patella amd lateral knee, bike, NuStep, ROM    PT Home Exercise Plan SLR, hamstring stretch and abd in sidelying, sit -stands    Consulted and Agree with Plan of Care Patient      Patient will benefit from skilled therapeutic intervention in order to improve the following deficits and impairments:  Abnormal gait, Increased fascial restricitons, Pain, Decreased mobility, Obesity, Increased edema, Impaired flexibility, Difficulty walking, Decreased strength, Decreased range of motion, Hypomobility  Visit Diagnosis: Right knee pain, unspecified chronicity  Difficulty in walking, not elsewhere classified  Stiffness of right knee, not elsewhere classified  Muscle weakness (generalized)  Stiffness of left knee, not elsewhere classified     Problem List Patient Active Problem List   Diagnosis Date Noted  . Asthma 01/30/2017  . Gynecologic exam normal 11/08/2016  . Urinary incontinence 11/08/2016  . Leukocytosis 06/29/2016  . Anemia 06/29/2016  . AKI (acute kidney injury) ([place])   . Primary localized osteoarthritis of right knee 06/27/2016  . Osteoarthritis of left knee 12/14/2015    Class: End Stage  . Osteoarthritis of right knee 12/14/2015  . Acute renal failure ([place]) 08/30/2013  . 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 N&V (nausea and vomiting) 08/30/2013  . Hypokalemia 08/30/2013  . Postmenopausal bleeding 03/18/2013  . OSTEOARTHRITIS, KNEE, LEFT 03/20/2010  . TRICUSPID REGURGITATION, MILD 03/03/2010  . RENAL INSUFFICIENCY 03/02/2010  . MORBID OBESITY 02/18/2010  . PREMATURE  ATRIAL CONTRACTIONS 02/18/2010  . PREMATURE VENTRICULAR CONTRACTIONS 02/18/2010  . CARDIAC MURMUR 02/18/2010  .  EDEMA 10/20/2009  . HYPOKALEMIA 07/30/2009  . RIB PAIN, RIGHT SIDED 05/26/2008  . ABDOMINAL PAIN, LEFT LOWER QUADRANT 12/31/2007  . ALLERGIC RHINITIS 06/25/2007  . ABNORMAL BLOOD CHEMISTRY , OTHER 06/25/2007  . UNSPECIFIED VITAMIN D DEFICIENCY 04/30/2007  . Hypocalcemia 04/19/2007  . GLAUCOMA NEC 04/19/2007  . Essential hypertension 04/19/2007  . Asthma 04/19/2007  . Ulcerative colitis ([place]) 04/19/2007  . DISORDER, MENSTRUAL NEC 04/19/2007  . PSORIASIS 04/19/2007  . 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 GOUT NOS 02/21/2006    Dorene Ar, PTA 06/07/2017, 7:17 AM  [place], [place], [address] Phone: [phone]   Fax:  [phone]  Name: Yuette Putnam MRN: [id number] Date of Birth: May 04, 1941

## 2017-06-08 ENCOUNTER — Ambulatory Visit: Payer: Medicare Other | Admitting: Physical Therapy

## 2017-06-08 DIAGNOSIS — M25561 Pain in right knee: Secondary | ICD-10-CM

## 2017-06-08 DIAGNOSIS — M25662 Stiffness of left knee, not elsewhere classified: Secondary | ICD-10-CM

## 2017-06-08 DIAGNOSIS — M6281 Muscle weakness (generalized): Secondary | ICD-10-CM

## 2017-06-08 DIAGNOSIS — M25562 Pain in left knee: Secondary | ICD-10-CM

## 2017-06-08 DIAGNOSIS — R262 Difficulty in walking, not elsewhere classified: Secondary | ICD-10-CM

## 2017-06-08 DIAGNOSIS — M25661 Stiffness of right knee, not elsewhere classified: Secondary | ICD-10-CM

## 2017-06-08 DIAGNOSIS — G8929 Other chronic pain: Secondary | ICD-10-CM

## 2017-06-08 NOTE — Therapy (Signed)
[place], [place], [address] Phone: [phone]   Fax:  [phone]  Physical Therapy Treatment  Patient Details  Name: Jackie Parker MRN: [id number] Date of Birth: 05/26/1941 Referring Provider: Dr. Louanne Skye   Encounter Date: 06/08/2017      PT End of Session - 06/08/17 1549    Visit Number 3   Number of Visits 8   Date for PT Re-Evaluation 06/23/17   PT Start Time 1503   PT Stop Time 1546   PT Time Calculation (min) 43 min   Activity Tolerance Patient tolerated treatment well   Behavior During Therapy [facility] for tasks assessed/performed      Past Medical History:  Diagnosis Date  . AKI (acute kidney injury) ([place]) 12/2015  . Allergy    takes Singulair and Zyrtec daily  . Asthma    uses Adair daily  . Cataracts, bilateral   . Colitis, ulcerative ([place])   . DJD (degenerative joint disease)   . Glaucoma    uses eye drops daily  . History of blood transfusion    no abnormal reaction noted. 40+ yrs ago  . History of bronchitis    yrs ago  . History of gout    not on any meds  . Hypertension    takes Amlodipine daily  . Joint pain   . Joint swelling   . Psoriasis   . Urinary frequency    takes Ditropan daily  . Vertigo    doesn't take any meds  . Vitamin D deficiency     Past Surgical History:  Procedure Laterality Date  . BACK SURGERY    . COLONOSCOPY    . ILEOSTOMY    . KNEE ARTHROPLASTY Left 12/14/2015   Procedure: COMPUTER ASSISTED LEFT TOTAL KNEE ARTHROPLASTY;  Surgeon: Jessy Oto, MD;  Location: [facility];  Service: Orthopedics;  Laterality: Left;  . TOTAL KNEE ARTHROPLASTY Left 12/14/2015  . TOTAL KNEE ARTHROPLASTY Right 06/27/2016  . TOTAL KNEE ARTHROPLASTY Right 06/27/2016   Procedure: RIGHT TOTAL KNEE ARTHROPLASTY;  Surgeon: Jessy Oto, MD;  Location: [facility];  Service: Orthopedics;  Laterality: Right;  . TUBAL LIGATION      There were no vitals filed for this visit.       Subjective Assessment - 06/08/17 1513    Subjective Was really sore after last session.  I prefer to exercise in standing. Has been doing her exercises.    Currently in Pain? Yes   Pain Score 6    Pain Location Knee   Pain Orientation Right;Left   Pain Descriptors / Indicators Aching   Pain Type Chronic pain   Pain Onset More than a month ago   Pain Frequency Intermittent            [facility] PT Assessment - 06/08/17 0001      AROM   Right Knee Extension -9   Right Knee Flexion 110   Left Knee Extension -7   Left Knee Flexion 114           [facility] Adult PT Treatment/Exercise - 06/08/17 0001      Knee/Hip Exercises: Stretches   Active Hamstring Stretch 2 reps;30 seconds   Gastroc Stretch Both;2 reps   Gastroc Stretch Limitations wall and slantboard    Soleus Stretch Both;2 reps   Soleus Stretch Limitations wall and slantboard      Knee/Hip Exercises: Aerobic   Nustep L7 x 6 minutes      Knee/Hip Exercises:  Standing   Hip Abduction Stengthening;Both;2 sets;10 reps   Hip Extension Stengthening;Both;2 sets;10 reps   Forward Step Up Both;1 set;20 reps;Hand Hold: 2;Step Height: 6"   Forward Step Up Limitations UE assist    Functional Squat 1 set;15 reps   Functional Squat Limitations wide at countertop      Knee/Hip Exercises: Seated   Long Arc Quad Strengthening;Both;1 set;20 reps;Weights   Long Arc Quad Weight 3 lbs.     Manual Therapy   Manual Therapy Joint mobilization   Manual therapy comments patellar mobs inf and superior, lateral  bilaterally   Joint Mobilization GR I gentle extension mobs                      PT Long Term Goals - 06/08/17 1553      PT LONG TERM GOAL #1   Title Pt will be able to report 25% less pain at night when she trying to rest, sleep    Status On-going     PT LONG TERM GOAL #2   Title Pt will be able to walk for 30 min in the store without increased knee pain    Status On-going     PT LONG TERM GOAL #3   Title Pt  will demonstrate no mroe than 8 degrees of bilateral knee extension for proper gait , posture.    Baseline lacks 6-9 deg bilaterally.    Status On-going     PT LONG TERM GOAL #4   Title pt will increase her FOTO score to <50% limited to dmeo improvement in function.    Status Unable to assess               Plan - 06/08/17 1550    Clinical Impression Statement Continued to exercise in standing without increased pain.  AROM about the same.  She continues to work out at [employer], use the pool.  Therapy should focus on knee AROM and hip strength.  Has done minimal hamstrgin stretching since visit.  Patellar mobs reduces stiffness upon standing to exit clinic.    PT Next Visit Plan calf, hamstring stretching, manual to patella amd lateral knee, bike, NuStep, ROM    PT Home Exercise Plan SLR, hamstring stretch and abd in sidelying, sit -stands    Consulted and Agree with Plan of Care Patient      Patient will benefit from skilled therapeutic intervention in order to improve the following deficits and impairments:  Abnormal gait, Increased fascial restricitons, Pain, Decreased mobility, Obesity, Increased edema, Impaired flexibility, Difficulty walking, Decreased strength, Decreased range of motion, Hypomobility  Visit Diagnosis: Right knee pain, unspecified chronicity  Difficulty in walking, not elsewhere classified  Stiffness of right knee, not elsewhere classified  Muscle weakness (generalized)  Stiffness of left knee, not elsewhere classified  Chronic pain of left knee     Problem List Patient Active Problem List   Diagnosis Date Noted  . Asthma 01/30/2017  . Gynecologic exam normal 11/08/2016  . Urinary incontinence 11/08/2016  . Leukocytosis 06/29/2016  . Anemia 06/29/2016  . AKI (acute kidney injury) ([place])   . Primary localized osteoarthritis of right knee 06/27/2016  . Osteoarthritis of left knee 12/14/2015    Class: End Stage  . Osteoarthritis of right knee  12/14/2015  . Acute renal failure ([place]) 08/30/2013  . 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 N&V (nausea and vomiting) 08/30/2013  . Hypokalemia 08/30/2013  . Postmenopausal bleeding 03/18/2013  . OSTEOARTHRITIS, KNEE,  LEFT 03/20/2010  . TRICUSPID REGURGITATION, MILD 03/03/2010  . RENAL INSUFFICIENCY 03/02/2010  . MORBID OBESITY 02/18/2010  . PREMATURE ATRIAL CONTRACTIONS 02/18/2010  . PREMATURE VENTRICULAR CONTRACTIONS 02/18/2010  . CARDIAC MURMUR 02/18/2010  . EDEMA 10/20/2009  . HYPOKALEMIA 07/30/2009  . RIB PAIN, RIGHT SIDED 05/26/2008  . ABDOMINAL PAIN, LEFT LOWER QUADRANT 12/31/2007  . ALLERGIC RHINITIS 06/25/2007  . ABNORMAL BLOOD CHEMISTRY , OTHER 06/25/2007  . UNSPECIFIED VITAMIN D DEFICIENCY 04/30/2007  . Hypocalcemia 04/19/2007  . GLAUCOMA NEC 04/19/2007  . Essential hypertension 04/19/2007  . Asthma 04/19/2007  . Ulcerative colitis ([place]) 04/19/2007  . DISORDER, MENSTRUAL NEC 04/19/2007  . PSORIASIS 04/19/2007  . 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 GOUT NOS 02/21/2006    Jezabella Schriever 06/08/2017, 3:55 PM  [facility] [address] [place], [place], [address] Phone: [phone]   Fax:  [phone]  Name: Jackie Parker MRN: [id number] Date of Birth: 1941-08-22  Raeford Razor, PT 06/08/17 3:55 PM Phone: [phone] Fax: [phone]

## 2017-06-13 ENCOUNTER — Ambulatory Visit: Payer: Medicare Other | Attending: Specialist | Admitting: Physical Therapy

## 2017-06-13 DIAGNOSIS — M6281 Muscle weakness (generalized): Secondary | ICD-10-CM | POA: Diagnosis present

## 2017-06-13 DIAGNOSIS — G8929 Other chronic pain: Secondary | ICD-10-CM | POA: Insufficient documentation

## 2017-06-13 DIAGNOSIS — R262 Difficulty in walking, not elsewhere classified: Secondary | ICD-10-CM | POA: Insufficient documentation

## 2017-06-13 DIAGNOSIS — M25561 Pain in right knee: Secondary | ICD-10-CM | POA: Diagnosis present

## 2017-06-13 DIAGNOSIS — M25661 Stiffness of right knee, not elsewhere classified: Secondary | ICD-10-CM | POA: Insufficient documentation

## 2017-06-13 DIAGNOSIS — M25562 Pain in left knee: Secondary | ICD-10-CM | POA: Diagnosis present

## 2017-06-13 DIAGNOSIS — M25662 Stiffness of left knee, not elsewhere classified: Secondary | ICD-10-CM | POA: Diagnosis present

## 2017-06-13 NOTE — Therapy (Signed)
[place], [place], [address] Phone: [phone]   Fax:  [phone]  Physical Therapy Treatment  Patient Details  Name: Jackie Parker MRN: [id number] Date of Birth: 1941/08/06 Referring Provider: Dr. Louanne Skye   Encounter Date: 06/13/2017      PT End of Session - 06/13/17 1511    Visit Number 4   Number of Visits 8   Date for PT Re-Evaluation 06/23/17   PT Start Time 0304   PT Stop Time 0345   PT Time Calculation (min) 41 min      Past Medical History:  Diagnosis Date  . AKI (acute kidney injury) ([place]) 12/2015  . Allergy    takes Singulair and Zyrtec daily  . Asthma    uses Adair daily  . Cataracts, bilateral   . Colitis, ulcerative ([place])   . DJD (degenerative joint disease)   . Glaucoma    uses eye drops daily  . History of blood transfusion    no abnormal reaction noted. 40+ yrs ago  . History of bronchitis    yrs ago  . History of gout    not on any meds  . Hypertension    takes Amlodipine daily  . Joint pain   . Joint swelling   . Psoriasis   . Urinary frequency    takes Ditropan daily  . Vertigo    doesn't take any meds  . Vitamin D deficiency     Past Surgical History:  Procedure Laterality Date  . BACK SURGERY    . COLONOSCOPY    . ILEOSTOMY    . KNEE ARTHROPLASTY Left 12/14/2015   Procedure: COMPUTER ASSISTED LEFT TOTAL KNEE ARTHROPLASTY;  Surgeon: Jessy Oto, MD;  Location: [facility];  Service: Orthopedics;  Laterality: Left;  . TOTAL KNEE ARTHROPLASTY Left 12/14/2015  . TOTAL KNEE ARTHROPLASTY Right 06/27/2016  . TOTAL KNEE ARTHROPLASTY Right 06/27/2016   Procedure: RIGHT TOTAL KNEE ARTHROPLASTY;  Surgeon: Jessy Oto, MD;  Location: [facility];  Service: Orthopedics;  Laterality: Right;  . TUBAL LIGATION      There were no vitals filed for this visit.      Subjective Assessment - 06/13/17 1531    Subjective Knees are not hurting today.   Currently in Pain? No/denies                          [facility] Adult PT Treatment/Exercise - 06/13/17 0001      Knee/Hip Exercises: Stretches   Active Hamstring Stretch Limitations seated with foot on stool x 1 minute each, doing quad sets   bilateral   Press photographer Both;2 reps   Gastroc Stretch Limitations wall and slantboard    Soleus Stretch Both;2 reps   Soleus Stretch Limitations wall and slantboard      Knee/Hip Exercises: Aerobic   Nustep L7 x 6 minutes      Knee/Hip Exercises: Standing   Knee Flexion 2 sets;10 reps   Hip Abduction Stengthening;Both;2 sets;10 reps   Hip Extension Stengthening;Both;2 sets;10 reps   Forward Step Up Both;1 set;20 reps;Hand Hold: 2;Step Height: 6"   Forward Step Up Limitations UE assist    Functional Squat 1 set;15 reps   Functional Squat Limitations wide at countertop      Knee/Hip Exercises: Seated   Long Arc Quad Strengthening;Left;1 set;20 reps;Weights   Long Arc Quad Weight 4 lbs.   Sit to Sand 10 reps;without UE support  Knee/Hip Exercises: Supine   Straight Leg Raises Strengthening;Both;1 set;10 reps     Manual Therapy   Manual Therapy Taping   Manual therapy comments patellar mobs inf and superior, lateral  bilaterally   Kinesiotex Create Space     Kinesiotix   Create Space Fat pad release                      PT Long Term Goals - 06/08/17 1553      PT LONG TERM GOAL #1   Title Pt will be able to report 25% less pain at night when she trying to rest, sleep    Status On-going     PT LONG TERM GOAL #2   Title Pt will be able to walk for 30 min in the store without increased knee pain    Status On-going     PT LONG TERM GOAL #3   Title Pt will demonstrate no mroe than 8 degrees of bilateral knee extension for proper gait , posture.    Baseline lacks 6-9 deg bilaterally.    Status On-going     PT LONG TERM GOAL #4   Title pt will increase her FOTO score to <50% limited to dmeo improvement in function.    Status  Unable to assess               Plan - 06/13/17 1532    Clinical Impression Statement Pt reports no pain at beginning of treatment. She had increased left lateral knee pain with step ups. Performed ice massage and use KT tape for fat pad release. Pt reports no pain at end of session.    PT Next Visit Plan calf, hamstring stretching, manual to patella amd lateral knee, bike, NuStep, ROM    PT Home Exercise Plan SLR, hamstring stretch and abd in sidelying, sit -stands    Consulted and Agree with Plan of Care Patient      Patient will benefit from skilled therapeutic intervention in order to improve the following deficits and impairments:  Abnormal gait, Increased fascial restricitons, Pain, Decreased mobility, Obesity, Increased edema, Impaired flexibility, Difficulty walking, Decreased strength, Decreased range of motion, Hypomobility  Visit Diagnosis: Right knee pain, unspecified chronicity  Difficulty in walking, not elsewhere classified  Stiffness of right knee, not elsewhere classified  Muscle weakness (generalized)  Stiffness of left knee, not elsewhere classified     Problem List Patient Active Problem List   Diagnosis Date Noted  . Asthma 01/30/2017  . Gynecologic exam normal 11/08/2016  . Urinary incontinence 11/08/2016  . Leukocytosis 06/29/2016  . Anemia 06/29/2016  . AKI (acute kidney injury) ([place])   . Primary localized osteoarthritis of right knee 06/27/2016  . Osteoarthritis of left knee 12/14/2015    Class: End Stage  . Osteoarthritis of right knee 12/14/2015  . Acute renal failure ([place]) 08/30/2013  . 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 N&V (nausea and vomiting) 08/30/2013  . Hypokalemia 08/30/2013  . Postmenopausal bleeding 03/18/2013  . OSTEOARTHRITIS, KNEE, LEFT 03/20/2010  . TRICUSPID REGURGITATION, MILD 03/03/2010  . RENAL INSUFFICIENCY 03/02/2010  . MORBID OBESITY 02/18/2010  . PREMATURE ATRIAL CONTRACTIONS 02/18/2010  . PREMATURE  VENTRICULAR CONTRACTIONS 02/18/2010  . CARDIAC MURMUR 02/18/2010  . EDEMA 10/20/2009  . HYPOKALEMIA 07/30/2009  . RIB PAIN, RIGHT SIDED 05/26/2008  . ABDOMINAL PAIN, LEFT LOWER QUADRANT 12/31/2007  . ALLERGIC RHINITIS 06/25/2007  . ABNORMAL BLOOD CHEMISTRY , OTHER 06/25/2007  . UNSPECIFIED VITAMIN D DEFICIENCY 04/30/2007  .  Hypocalcemia 04/19/2007  . GLAUCOMA NEC 04/19/2007  . Essential hypertension 04/19/2007  . Asthma 04/19/2007  . Ulcerative colitis ([place]) 04/19/2007  . DISORDER, MENSTRUAL NEC 04/19/2007  . PSORIASIS 04/19/2007  . 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 GOUT NOS 02/21/2006    Dorene Ar , PTA 06/13/2017, 3:55 PM  [facility] [address] [place], [place], [address] Phone: [phone]   Fax:  [phone]  Name: Jackie Parker MRN: [id number] Date of Birth: 12-Apr-1941

## 2017-06-15 ENCOUNTER — Ambulatory Visit: Payer: Medicare Other | Admitting: Physical Therapy

## 2017-06-15 DIAGNOSIS — M25562 Pain in left knee: Secondary | ICD-10-CM

## 2017-06-15 DIAGNOSIS — M6281 Muscle weakness (generalized): Secondary | ICD-10-CM

## 2017-06-15 DIAGNOSIS — M25561 Pain in right knee: Secondary | ICD-10-CM

## 2017-06-15 DIAGNOSIS — G8929 Other chronic pain: Secondary | ICD-10-CM

## 2017-06-15 DIAGNOSIS — R262 Difficulty in walking, not elsewhere classified: Secondary | ICD-10-CM

## 2017-06-15 DIAGNOSIS — M25661 Stiffness of right knee, not elsewhere classified: Secondary | ICD-10-CM

## 2017-06-15 DIAGNOSIS — M25662 Stiffness of left knee, not elsewhere classified: Secondary | ICD-10-CM

## 2017-06-15 NOTE — Therapy (Addendum)
[place], [place], [address] Phone: [phone]   Fax:  [phone]  Physical Therapy Treatment and Addended DIscharge   Patient Details  Name: Jackie Parker MRN: [id number] Date of Birth: Apr 02, 1941 Referring Provider: Dr. Louanne Skye   Encounter Date: 06/15/2017      PT End of Session - 06/15/17 1559    Visit Number 5   Number of Visits 8   Date for PT Re-Evaluation 06/23/17   PT Start Time 1501   PT Stop Time 1545   PT Time Calculation (min) 44 min   Activity Tolerance Patient tolerated treatment well   Behavior During Therapy [facility] for tasks assessed/performed      Past Medical History:  Diagnosis Date  . AKI (acute kidney injury) ([place]) 12/2015  . Allergy    takes Singulair and Zyrtec daily  . Asthma    uses Adair daily  . Cataracts, bilateral   . Colitis, ulcerative ([place])   . DJD (degenerative joint disease)   . Glaucoma    uses eye drops daily  . History of blood transfusion    no abnormal reaction noted. 40+ yrs ago  . History of bronchitis    yrs ago  . History of gout    not on any meds  . Hypertension    takes Amlodipine daily  . Joint pain   . Joint swelling   . Psoriasis   . Urinary frequency    takes Ditropan daily  . Vertigo    doesn't take any meds  . Vitamin D deficiency     Past Surgical History:  Procedure Laterality Date  . BACK SURGERY    . COLONOSCOPY    . ILEOSTOMY    . KNEE ARTHROPLASTY Left 12/14/2015   Procedure: COMPUTER ASSISTED LEFT TOTAL KNEE ARTHROPLASTY;  Surgeon: Jessy Oto, MD;  Location: [facility];  Service: Orthopedics;  Laterality: Left;  . TOTAL KNEE ARTHROPLASTY Left 12/14/2015  . TOTAL KNEE ARTHROPLASTY Right 06/27/2016  . TOTAL KNEE ARTHROPLASTY Right 06/27/2016   Procedure: RIGHT TOTAL KNEE ARTHROPLASTY;  Surgeon: Jessy Oto, MD;  Location: [facility];  Service: Orthopedics;  Laterality: Right;  . TUBAL LIGATION      There were no vitals filed  for this visit.      Subjective Assessment - 06/15/17 1509    Subjective Patient has not had pain at night in t afew days.  The tape seemd to help, so did the ice.    Currently in Pain? No/denies                         [facility] Adult PT Treatment/Exercise - 06/15/17 0001      Knee/Hip Exercises: Stretches   Gastroc Stretch Both;1 rep   Soleus Stretch Both;1 rep     Knee/Hip Exercises: Aerobic   Nustep L7 x 6 minutes      Knee/Hip Exercises: Machines for Strengthening   Cybex Knee Extension 20 lbs 2 x 10   Cybex Knee Flexion 25 lbs 2x 10    Cybex Leg Press 1 plate , 3 x 10 reps parallel and turned out      Knee/Hip Exercises: Standing   Heel Raises Both;1 set;15 reps   Heel Raises Limitations needed UE support close by due to balance    Hip Abduction Stengthening;Both;2 sets;10 reps;Knee straight   Abduction Limitations cues for toes pointing forward    Wall Squat 2 sets   Wall  Squat Limitations hold with toe lifts, and then heel raises, unable to do without moving hips away from wall      Manual Therapy   Manual Therapy Taping   McConnell used McConnell for stronger hold, but then went back to Kinesio for more flexibility.  Tape did not hold well.      Kinesiotix   Create Space Fat pad release                 PT Education - 06/15/17 1558    Education provided Yes   Education Details HEP technique , gym exercises for weighttraining    Person(s) Educated Patient   Methods Explanation;Demonstration;Verbal cues   Comprehension Verbalized understanding;Returned demonstration             PT Long Term Goals - 06/15/17 1601      PT LONG TERM GOAL #1   Title Pt will be able to report 25% less pain at night when she trying to rest, sleep    Baseline but only recently    Status Achieved     PT LONG TERM GOAL #2   Title Pt will be able to walk for 30 min in the store without increased knee pain    Status Achieved     PT LONG TERM GOAL #3    Title Pt will demonstrate no mroe than 8 degrees of bilateral knee extension for proper gait , posture.    Status Unable to assess     PT LONG TERM GOAL #4   Title pt will increase her FOTO score to <50% limited to dmeo improvement in function.    Status Unable to assess               Plan - 06/15/17 1601    Clinical Impression Statement Patient will need to R/S next appt.  Will see if MD thinks she should continue vs DC after POC.  No pain with exercises today.  Re-taped (with increased time) for security in knee per her request.  unable to effectively perform heel raises without UE assist, weakness.    PT Next Visit Plan calf, hamstring stretching, manual to patella amd lateral knee, bike, NuStep, ROM    PT Home Exercise Plan SLR, hamstring stretch and abd in sidelying, sit -stands    Consulted and Agree with Plan of Care Patient      Patient will benefit from skilled therapeutic intervention in order to improve the following deficits and impairments:  Abnormal gait, Increased fascial restricitons, Pain, Decreased mobility, Obesity, Increased edema, Impaired flexibility, Difficulty walking, Decreased strength, Decreased range of motion, Hypomobility  Visit Diagnosis: Right knee pain, unspecified chronicity  Difficulty in walking, not elsewhere classified  Stiffness of right knee, not elsewhere classified  Muscle weakness (generalized)  Stiffness of left knee, not elsewhere classified  Chronic pain of left knee     Problem List Patient Active Problem List   Diagnosis Date Noted  . Asthma 01/30/2017  . Gynecologic exam normal 11/08/2016  . Urinary incontinence 11/08/2016  . Leukocytosis 06/29/2016  . Anemia 06/29/2016  . AKI (acute kidney injury) ([place])   . Primary localized osteoarthritis of right knee 06/27/2016  . Osteoarthritis of left knee 12/14/2015    Class: End Stage  . Osteoarthritis of right knee 12/14/2015  . Acute renal failure ([place]) 08/30/2013  .  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 N&V (nausea and vomiting) 08/30/2013  . Hypokalemia 08/30/2013  . Postmenopausal bleeding 03/18/2013  . OSTEOARTHRITIS,  KNEE, LEFT 03/20/2010  . TRICUSPID REGURGITATION, MILD 03/03/2010  . RENAL INSUFFICIENCY 03/02/2010  . MORBID OBESITY 02/18/2010  . PREMATURE ATRIAL CONTRACTIONS 02/18/2010  . PREMATURE VENTRICULAR CONTRACTIONS 02/18/2010  . CARDIAC MURMUR 02/18/2010  . EDEMA 10/20/2009  . HYPOKALEMIA 07/30/2009  . RIB PAIN, RIGHT SIDED 05/26/2008  . ABDOMINAL PAIN, LEFT LOWER QUADRANT 12/31/2007  . ALLERGIC RHINITIS 06/25/2007  . ABNORMAL BLOOD CHEMISTRY , OTHER 06/25/2007  . UNSPECIFIED VITAMIN D DEFICIENCY 04/30/2007  . Hypocalcemia 04/19/2007  . GLAUCOMA NEC 04/19/2007  . Essential hypertension 04/19/2007  . Asthma 04/19/2007  . Ulcerative colitis ([place]) 04/19/2007  . DISORDER, MENSTRUAL NEC 04/19/2007  . PSORIASIS 04/19/2007  . 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 GOUT NOS 02/21/2006    Constancia Geeting 06/15/2017, 4:04 PM  [facility] [address] [place], [place], [address] Phone: [phone]   Fax:  [phone]  Name: Jackie Parker MRN: [id number] Date of Birth: 05/05/1941  Raeford Razor, PT 06/15/17 4:05 PM Phone: [phone] Fax: [phone]   PHYSICAL THERAPY DISCHARGE SUMMARY  Visits from Start of Care: 5  Current functional level related to goals / functional outcomes: See above for most recent info    Remaining deficits: See above for most recent, saw MD and decided not to comeback to PT    Education / Equipment: HEP, RICE, gait  Plan: Patient agrees to discharge.  Patient goals were partially met. Patient is being discharged due to not returning since the last visit.  ?????     Raeford Razor, PT 07/19/17 9:41 AM Phone: [phone] Fax: [phone]

## 2017-06-20 ENCOUNTER — Encounter: Payer: Medicare Other | Admitting: Physical Therapy

## 2017-06-22 ENCOUNTER — Encounter: Payer: Medicare Other | Admitting: Physical Therapy

## 2017-06-26 ENCOUNTER — Encounter (INDEPENDENT_AMBULATORY_CARE_PROVIDER_SITE_OTHER): Payer: Self-pay | Admitting: Specialist

## 2017-06-26 ENCOUNTER — Ambulatory Visit (INDEPENDENT_AMBULATORY_CARE_PROVIDER_SITE_OTHER): Payer: Medicare Other | Admitting: Specialist

## 2017-06-26 VITALS — BP 145/81 | HR 93 | Ht 62.0 in | Wt 169.0 lb

## 2017-06-26 DIAGNOSIS — M66262 Spontaneous rupture of extensor tendons, left lower leg: Secondary | ICD-10-CM | POA: Diagnosis not present

## 2017-06-26 DIAGNOSIS — Z96653 Presence of artificial knee joint, bilateral: Secondary | ICD-10-CM

## 2017-06-26 NOTE — Patient Instructions (Signed)
Be careful with stairs, slopes and squatting. Working on strengthening of the thigh, straight leg raises and lat few degrees of bending and straightening. Ice or heat in form moist heat.

## 2017-06-26 NOTE — Progress Notes (Signed)
Office Visit Note   Patient: Jackie Parker           Date of Birth: [birth year]           MRN: [id number] Visit Date: 06/26/2017              Requested by: Arnoldo Morale, MD [place], [place] [address] PCP: Arnoldo Morale, MD   Assessment & Plan: Visit Diagnoses:  1. Tendon rupture, nontraumatic, patella, left   2. Status post total knee replacement using cement, bilateral     Plan: Be careful with stairs, slopes and squatting. Working on strengthening of the thigh, straight leg raises and lat few degrees of bending and straightening. Ice or heat in form moist heat.  Follow-Up Instructions: Return in about 6 months (around 12/25/2017).   Orders:  No orders of the defined types were placed in this encounter.  No orders of the defined types were placed in this encounter.     Procedures: No procedures performed   Clinical Data: No additional findings.   Subjective: Chief Complaint  Patient presents with  . Right Knee - Follow-up  . Left Knee - Follow-up    76 year old female s/p left TKR 12/2015 and right TKR 06/2016. She was seen last and evaluated, plain radiographs fro 11/2016 showed some elevation of the left patella to an alta position.    Review of Systems  Constitutional: Negative.   HENT: Negative.   Eyes: Negative.   Respiratory: Negative.   Cardiovascular: Negative.   Gastrointestinal: Negative.   Endocrine: Negative.   Genitourinary: Negative.   Musculoskeletal: Negative.   Skin: Negative.   Allergic/Immunologic: Negative.   Neurological: Negative.   Hematological: Negative.   Psychiatric/Behavioral: Negative.      Objective: Vital Signs: BP (!) 145/81 (BP Location: Left Arm, Patient Position: Sitting)   Pulse 93   Ht 5' 2"  (1.575 m)   Wt 169 lb (76.7 kg)   BMI 30.91 kg/m   Physical Exam  Constitutional: She is oriented to person, place, and time. She appears well-developed and well-nourished.  HENT:  Head:  Normocephalic and atraumatic.  Eyes: Pupils are equal, round, and reactive to light. EOM are normal.  Neck: Normal range of motion. Neck supple.  Pulmonary/Chest: Effort normal and breath sounds normal.  Abdominal: Soft. Bowel sounds are normal.  Musculoskeletal: Normal range of motion.  Neurological: She is alert and oriented to person, place, and time.  Skin: Skin is warm and dry.  Psychiatric: She has a normal mood and affect. Her behavior is normal. Judgment and thought content normal.    Right Knee Exam   Range of Motion  Extension:  -5 normal  Flexion: 130   Muscle Strength   The patient has normal right knee strength.  Other  Scars: present Swelling: mild   Left Knee Exam   Range of Motion  Extension: normal  Flexion: 130   Muscle Strength   The patient has normal left knee strength.  Other  Scars: present Swelling: mild      Specialty Comments:  No specialty comments available.  Imaging: No results found.   PMFS History: Patient Active Problem List   Diagnosis Date Noted  . Osteoarthritis of left knee 12/14/2015    Priority: High    Class: End Stage  . Asthma 01/30/2017  . Gynecologic exam normal 11/08/2016  . Urinary incontinence 11/08/2016  . Leukocytosis 06/29/2016  . Anemia 06/29/2016  . AKI (acute kidney injury) ([facility])   .  Primary localized osteoarthritis of right knee 06/27/2016  . Osteoarthritis of right knee 12/14/2015  . Acute renal failure ([facility]) 08/30/2013  . 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 N&V (nausea and vomiting) 08/30/2013  . Hypokalemia 08/30/2013  . Postmenopausal bleeding 03/18/2013  . OSTEOARTHRITIS, KNEE, LEFT 03/20/2010  . TRICUSPID REGURGITATION, MILD 03/03/2010  . RENAL INSUFFICIENCY 03/02/2010  . MORBID OBESITY 02/18/2010  . PREMATURE ATRIAL CONTRACTIONS 02/18/2010  . PREMATURE VENTRICULAR CONTRACTIONS 02/18/2010  . CARDIAC MURMUR 02/18/2010  . EDEMA 10/20/2009  . HYPOKALEMIA 07/30/2009  . RIB PAIN,  RIGHT SIDED 05/26/2008  . ABDOMINAL PAIN, LEFT LOWER QUADRANT 12/31/2007  . ALLERGIC RHINITIS 06/25/2007  . ABNORMAL BLOOD CHEMISTRY , OTHER 06/25/2007  . UNSPECIFIED VITAMIN D DEFICIENCY 04/30/2007  . Hypocalcemia 04/19/2007  . GLAUCOMA NEC 04/19/2007  . Essential hypertension 04/19/2007  . Asthma 04/19/2007  . Ulcerative colitis ([facility]) 04/19/2007  . DISORDER, MENSTRUAL NEC 04/19/2007  . PSORIASIS 04/19/2007  . 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 GOUT NOS 02/21/2006   Past Medical History:  Diagnosis Date  . AKI (acute kidney injury) ([facility]) 12/2015  . Allergy    takes Singulair and Zyrtec daily  . Asthma    uses Adair daily  . Cataracts, bilateral   . Colitis, ulcerative ([facility])   . DJD (degenerative joint disease)   . Glaucoma    uses eye drops daily  . History of blood transfusion    no abnormal reaction noted. 40+ yrs ago  . History of bronchitis    yrs ago  . History of gout    not on any meds  . Hypertension    takes Amlodipine daily  . Joint pain   . Joint swelling   . Psoriasis   . Urinary frequency    takes Ditropan daily  . Vertigo    doesn't take any meds  . Vitamin D deficiency     Family History  Problem Relation Age of Onset  . Hypertension Sister   . Glaucoma Sister     Past Surgical History:  Procedure Laterality Date  . BACK SURGERY    . COLONOSCOPY    . ILEOSTOMY    . KNEE ARTHROPLASTY Left 12/14/2015   Procedure: COMPUTER ASSISTED LEFT TOTAL KNEE ARTHROPLASTY;  Surgeon: Jessy Oto, MD;  Location: [facility];  Service: Orthopedics;  Laterality: Left;  . TOTAL KNEE ARTHROPLASTY Left 12/14/2015  . TOTAL KNEE ARTHROPLASTY Right 06/27/2016  . TOTAL KNEE ARTHROPLASTY Right 06/27/2016   Procedure: RIGHT TOTAL KNEE ARTHROPLASTY;  Surgeon: Jessy Oto, MD;  Location: [facility];  Service: Orthopedics;  Laterality: Right;  . TUBAL LIGATION     Social History   Occupational History  . Not on file.   Social History Main Topics  . Smoking status:  Never Smoker  . Smokeless tobacco: Never Used  . Alcohol use No  . Drug use: No  . Sexual activity: No

## 2017-07-28 ENCOUNTER — Telehealth: Payer: Self-pay | Admitting: Family Medicine

## 2017-07-28 DIAGNOSIS — R05 Cough: Secondary | ICD-10-CM

## 2017-07-28 DIAGNOSIS — R059 Cough, unspecified: Secondary | ICD-10-CM

## 2017-07-28 MED ORDER — BENZONATATE 100 MG PO CAPS: ORAL_CAPSULE | ORAL | 0 refills | Status: DC

## 2017-07-28 NOTE — Telephone Encounter (Signed)
Spoke to patient she c/o dry cough since Wednesday.  Denies nasal drip, SOB, fever, dizziness  She has taken Advil OTC to see if this will help.  She does not have anymore Tessalon.   If able to call in medication, she request Rx sent to [facility] on [address].

## 2017-07-28 NOTE — Telephone Encounter (Signed)
Done

## 2017-07-28 NOTE — Telephone Encounter (Signed)
Pt. Called stating that she has a cold and would like to be prescribed an Rx. Informed pt. That she would have to be seen and that her PCP was booked for today. Offered another appointment with another PCP and she stated that she preferred to see her PCP. Pt. Stated to just put a note in the system for her nurse. Please f/u

## 2017-08-01 ENCOUNTER — Ambulatory Visit: Payer: Medicare Other | Admitting: Obstetrics and Gynecology

## 2017-08-09 DIAGNOSIS — H401122 Primary open-angle glaucoma, left eye, moderate stage: Secondary | ICD-10-CM | POA: Diagnosis not present

## 2017-08-09 DIAGNOSIS — H401412 Capsular glaucoma with pseudoexfoliation of lens, right eye, moderate stage: Secondary | ICD-10-CM | POA: Diagnosis not present

## 2017-08-16 ENCOUNTER — Ambulatory Visit (INDEPENDENT_AMBULATORY_CARE_PROVIDER_SITE_OTHER): Payer: Medicare Other | Admitting: Obstetrics and Gynecology

## 2017-08-16 ENCOUNTER — Encounter: Payer: Self-pay | Admitting: Obstetrics and Gynecology

## 2017-08-16 DIAGNOSIS — Z30431 Encounter for routine checking of intrauterine contraceptive device: Secondary | ICD-10-CM | POA: Diagnosis not present

## 2017-08-16 DIAGNOSIS — Z933 Colostomy status: Secondary | ICD-10-CM | POA: Diagnosis not present

## 2017-08-16 NOTE — Progress Notes (Signed)
Pt presents for IUD removal. Unsure if she wants another IUD.   As noted. Pt had IUD placed 03/18/13 for PMB. No problems since insertion  PE AF VSS Lungs clear Heart RRR Abd soft + BS GU: IUD strings noted  A/P IUD check  Discussed with pt that IUD did not have to be removed until July 2019 if she desired. She desires to leave the IUD in place until next July F/U PRN

## 2017-09-12 DIAGNOSIS — Z933 Colostomy status: Secondary | ICD-10-CM | POA: Diagnosis not present

## 2017-10-11 ENCOUNTER — Encounter: Payer: Self-pay | Admitting: Family Medicine

## 2017-10-11 ENCOUNTER — Ambulatory Visit: Payer: Medicare Other | Attending: Family Medicine | Admitting: Family Medicine

## 2017-10-11 VITALS — BP 118/71 | HR 88 | Temp 97.7°F | Wt 192.0 lb

## 2017-10-11 DIAGNOSIS — Z96653 Presence of artificial knee joint, bilateral: Secondary | ICD-10-CM | POA: Diagnosis not present

## 2017-10-11 DIAGNOSIS — H269 Unspecified cataract: Secondary | ICD-10-CM | POA: Diagnosis not present

## 2017-10-11 DIAGNOSIS — H409 Unspecified glaucoma: Secondary | ICD-10-CM | POA: Diagnosis not present

## 2017-10-11 DIAGNOSIS — E559 Vitamin D deficiency, unspecified: Secondary | ICD-10-CM | POA: Diagnosis not present

## 2017-10-11 DIAGNOSIS — R350 Frequency of micturition: Secondary | ICD-10-CM | POA: Insufficient documentation

## 2017-10-11 DIAGNOSIS — M1711 Unilateral primary osteoarthritis, right knee: Secondary | ICD-10-CM | POA: Diagnosis not present

## 2017-10-11 DIAGNOSIS — I10 Essential (primary) hypertension: Secondary | ICD-10-CM | POA: Insufficient documentation

## 2017-10-11 DIAGNOSIS — Z882 Allergy status to sulfonamides status: Secondary | ICD-10-CM | POA: Diagnosis not present

## 2017-10-11 DIAGNOSIS — M1A00X0 Idiopathic chronic gout, unspecified site, without tophus (tophi): Secondary | ICD-10-CM | POA: Insufficient documentation

## 2017-10-11 DIAGNOSIS — L409 Psoriasis, unspecified: Secondary | ICD-10-CM | POA: Insufficient documentation

## 2017-10-11 DIAGNOSIS — J4520 Mild intermittent asthma, uncomplicated: Secondary | ICD-10-CM | POA: Diagnosis not present

## 2017-10-11 DIAGNOSIS — K5190 Ulcerative colitis, unspecified, without complications: Secondary | ICD-10-CM | POA: Insufficient documentation

## 2017-10-11 DIAGNOSIS — Z9049 Acquired absence of other specified parts of digestive tract: Secondary | ICD-10-CM | POA: Diagnosis not present

## 2017-10-11 DIAGNOSIS — Z79899 Other long term (current) drug therapy: Secondary | ICD-10-CM | POA: Insufficient documentation

## 2017-10-11 MED ORDER — AMLODIPINE BESYLATE 5 MG PO TABS: 5.0000 mg | ORAL_TABLET | Freq: Every day | ORAL | 6 refills | Status: DC

## 2017-10-11 MED ORDER — ALBUTEROL SULFATE HFA 108 (90 BASE) MCG/ACT IN AERS: INHALATION_SPRAY | RESPIRATORY_TRACT | 3 refills | Status: DC

## 2017-10-11 NOTE — Patient Instructions (Signed)

## 2017-10-11 NOTE — Progress Notes (Signed)
Subjective:  Patient ID: Jackie Parker, female    DOB: 07/16/1941  Age: 77 y.o. MRN: [id number]  CC: Hypertension   HPI Jackie Parker is a 77 year old female with Medical history significant for hypertension, ulcerative colitis status post total colectomy in 1977, Glaucoma bilateral knee osteoarthritis status post bilateral total knee replacement (left in 12/2015, right in 06/2016) here for follow-up visit.  She has been compliant with her antihypertensive and denies adverse effects from her medication. Her asthma has been stable with no acute exacerbations and she is requesting a refill of her MDI. With regards to her gout she has not had a flare in over 5 years and has not been taking allopurinol or colchicine.  Her knees sometimes hurt especially the right and she does not take medications for pain as she states pain is bearable; it is rated as a 2/10 at this time.  Denies recent falls and does not ambulate with the aid of an assistive device. [facility]mology at [facility] are responsible for management of her glaucoma; denies visual concerns today.   Past Medical History:  Diagnosis Date  . AKI (acute kidney injury) ([facility]) 12/2015  . Allergy    takes Singulair and Zyrtec daily  . Asthma    uses Adair daily  . Cataracts, bilateral   . Colitis, ulcerative ([facility])   . DJD (degenerative joint disease)   . Glaucoma    uses eye drops daily  . History of blood transfusion    no abnormal reaction noted. 40+ yrs ago  . History of bronchitis    yrs ago  . History of gout    not on any meds  . Hypertension    takes Amlodipine daily  . Joint pain   . Joint swelling   . Psoriasis   . Urinary frequency    takes Ditropan daily  . Vertigo    doesn't take any meds  . Vitamin D deficiency     Past Surgical History:  Procedure Laterality Date  . BACK SURGERY    . COLONOSCOPY    . ILEOSTOMY    . KNEE ARTHROPLASTY Left 12/14/2015   Procedure: COMPUTER ASSISTED LEFT TOTAL  KNEE ARTHROPLASTY;  Surgeon: Jessy Oto, MD;  Location: [facility];  Service: Orthopedics;  Laterality: Left;  . TOTAL KNEE ARTHROPLASTY Left 12/14/2015  . TOTAL KNEE ARTHROPLASTY Right 06/27/2016  . TOTAL KNEE ARTHROPLASTY Right 06/27/2016   Procedure: RIGHT TOTAL KNEE ARTHROPLASTY;  Surgeon: Jessy Oto, MD;  Location: [facility];  Service: Orthopedics;  Laterality: Right;  . TUBAL LIGATION       Allergies  Allergen Reactions  . Sulfonamide Derivatives Rash     Outpatient Medications Prior to Visit  Medication Sig Dispense Refill  . brinzolamide (AZOPT) 1 % ophthalmic suspension Place 1 drop into the right eye 2 (two) times daily.    . cholecalciferol (VITAMIN D) 1000 units tablet Take 1,000 Units by mouth daily.    Marland Kitchen LUMIGAN 0.01 % SOLN Place 1 drop into both eyes at bedtime.    . prochlorperazine (COMPAZINE) 10 MG tablet Take 10 mg by mouth 2 (two) times daily as needed. For nausea/vomiting.    Marland Kitchen albuterol (PROAIR HFA) 108 (90 Base) MCG/ACT inhaler INHALE 2 PUFFS INTO THE LUNGS EVERY 6 HOURS AS NEEDED FOR WHEEZING OR SHORTNESS OF BREATH 8.5 g 1  . amLODipine (NORVASC) 5 MG tablet Take 1 tablet (5 mg total) by mouth daily. 30 tablet 5  . benzonatate (TESSALON) 100 MG capsule  TAKE 1 CAPSULE(100 MG) BY MOUTH THREE TIMES DAILY AS NEEDED FOR COUGH 21 capsule 0  . colchicine 0.6 MG tablet Take 1 tablet (0.6 mg total) by mouth 2 (two) times daily. (Patient not taking: Reported on 08/16/2017) 5 tablet 1  . Pumpkin Seed-Soy Germ (AZO BLADDER CONTROL/GO-LESS PO) Take 1-2 tablets by mouth daily as needed (bladder control).     . traMADol (ULTRAM) 50 MG tablet TAKE ONE-HALF TO 1 TABLET BY MOUTH THREE TIMES DAILY DURING THE DAY AS NEEDED FOR PAIN (Patient not taking: Reported on 08/16/2017) 30 tablet 0  . allopurinol (ZYLOPRIM) 100 MG tablet Take 1 tablet (100 mg total) by mouth 2 (two) times daily. (Patient not taking: Reported on 10/11/2017) 60 tablet 11  . cetirizine (ZYRTEC) 10 MG tablet Take 1 tablet  (10 mg total) by mouth daily. (Patient not taking: Reported on 10/11/2017) 30 tablet 1  . fluticasone (FLOVENT HFA) 110 MCG/ACT inhaler Inhale 2 puffs into the lungs 2 (two) times daily. (Patient not taking: Reported on 08/16/2017) 1 Inhaler 3  . gabapentin (NEURONTIN) 100 MG capsule Take 100 mg by mouth daily as needed. For back pain.    Marland Kitchen ibuprofen (ADVIL,MOTRIN) 600 MG tablet Take 1 tablet (600 mg total) by mouth every 12 (twelve) hours as needed. (Patient not taking: Reported on 08/16/2017) 30 tablet 1  . ondansetron (ZOFRAN ODT) 4 MG disintegrating tablet Take 1 tablet (4 mg total) by mouth every 8 (eight) hours as needed for nausea or vomiting. (Patient not taking: Reported on 08/16/2017) 20 tablet 0   Facility-Administered Medications Prior to Visit  Medication Dose Route Frequency Provider Last Rate Last Dose  . indomethacin (INDOCIN) capsule 25 mg  25 mg Oral BID WC Jessy Oto, MD        ROS Review of Systems  Constitutional: Negative for activity change, appetite change and fatigue.  HENT: Negative for congestion, sinus pressure and sore throat.   Eyes: Negative for visual disturbance.  Respiratory: Negative for cough, chest tightness, shortness of breath and wheezing.   Cardiovascular: Negative for chest pain and palpitations.  Gastrointestinal: Negative for abdominal distention, abdominal pain and constipation.  Endocrine: Negative for polydipsia.  Genitourinary: Negative for dysuria and frequency.  Musculoskeletal: Negative for arthralgias and back pain.       See hpi  Skin: Negative for rash.  Neurological: Negative for tremors, light-headedness and numbness.  Hematological: Does not bruise/bleed easily.  Psychiatric/Behavioral: Negative for agitation and behavioral problems.    Objective:  BP 118/71   Pulse 88   Temp 97.7 F (36.5 C) (Oral)   Wt 192 lb (87.1 kg)   SpO2 98%   BMI 35.12 kg/m   BP/Weight 10/11/2017 08/16/2017 03/40/3524  Systolic BP 818 590 931    Diastolic BP 71 90 81  Wt. (Lbs) 192 190 169  BMI 35.12 34.75 30.91      Physical Exam  Constitutional: She is oriented to person, place, and time. She appears well-developed and well-nourished.  Cardiovascular: Normal rate, normal heart sounds and intact distal pulses.  No murmur heard. Pulmonary/Chest: Effort normal and breath sounds normal. She has no wheezes. She has no rales. She exhibits no tenderness.  Abdominal: Soft. Bowel sounds are normal. She exhibits mass (colostomy bag in place). She exhibits no distension. There is no tenderness.  Musculoskeletal: She exhibits no edema or tenderness.  Crepitus in both knees on range of motion  Neurological: She is alert and oriented to person, place, and time.  Skin: Skin is  warm and dry.  Psychiatric: She has a normal mood and affect.    CMP Latest Ref Rng & Units 05/08/2017 02/04/2017 11/09/2016  Glucose 65 - 99 mg/dL 76 166(H) 80  BUN 8 - 27 mg/dL 13 11 14   Creatinine 0.57 - 1.00 mg/dL 1.15(H) 1.28(H) 1.16(H)  Sodium 134 - 144 mmol/L 144 140 143  Potassium 3.5 - 5.2 mmol/L 4.8 2.7(LL) 3.5  Chloride 96 - 106 mmol/L 109(H) 107 109  CO2 20 - 29 mmol/L 16(L) 20(L) 23  Calcium 8.7 - 10.3 mg/dL 8.9 8.7(L) 8.7  Total Protein 6.5 - 8.1 g/dL - 7.9 7.2  Total Bilirubin 0.3 - 1.2 mg/dL - 1.7(H) 0.9  Alkaline Phos 38 - 126 U/L - 115 95  AST 15 - 41 U/L - 41 30  ALT 14 - 54 U/L - 17 14    Lipid Panel     Component Value Date/Time   CHOL 145 11/09/2016 1054   TRIG 76 11/09/2016 1054   HDL 55 11/09/2016 1054   CHOLHDL 2.6 11/09/2016 1054   VLDL 15 11/09/2016 1054   LDLCALC 75 11/09/2016 1054     Assessment & Plan:   1. Mild intermittent asthma without complication Controlled, no acute exacerbation - albuterol (PROAIR HFA) 108 (90 Base) MCG/ACT inhaler; INHALE 2 PUFFS INTO THE LUNGS EVERY 6 HOURS AS NEEDED FOR WHEEZING OR SHORTNESS OF BREATH  Dispense: 8.5 g; Refill: 3  2. Essential hypertension Controlled Counseled on blood  pressure goal of less than 130/80, low-sodium, DASH diet, medication compliance, 150 minutes of moderate intensity exercise per week. Discussed medication compliance, adverse effects. - CMP14+EGFR; Future - Lipid panel; Future - amLODipine (NORVASC) 5 MG tablet; Take 1 tablet (5 mg total) by mouth daily.  Dispense: 30 tablet; Refill: 6  3. Primary osteoarthritis of right knee Pain is minimal at this time She is doing well without an analgesic Follow-up with orthopedics  4. Idiopathic chronic gout without tophus, unspecified site No recent flares Discontinue allopurinol and use colchicine only as needed Follow low purine eating plan - Uric Acid; Future   Meds ordered this encounter  Medications  . albuterol (PROAIR HFA) 108 (90 Base) MCG/ACT inhaler    Sig: INHALE 2 PUFFS INTO THE LUNGS EVERY 6 HOURS AS NEEDED FOR WHEEZING OR SHORTNESS OF BREATH    Dispense:  8.5 g    Refill:  3  . amLODipine (NORVASC) 5 MG tablet    Sig: Take 1 tablet (5 mg total) by mouth daily.    Dispense:  30 tablet    Refill:  6    Follow-up: Return in about 6 months (around 04/10/2018) for follow up of Hypertension.   [name] MD

## 2017-10-16 ENCOUNTER — Ambulatory Visit: Payer: Medicare Other | Attending: Family Medicine

## 2017-10-16 DIAGNOSIS — M1A00X0 Idiopathic chronic gout, unspecified site, without tophus (tophi): Secondary | ICD-10-CM | POA: Insufficient documentation

## 2017-10-16 DIAGNOSIS — I10 Essential (primary) hypertension: Secondary | ICD-10-CM | POA: Insufficient documentation

## 2017-10-16 NOTE — Progress Notes (Signed)
Patient here for lab visit only 

## 2017-10-17 LAB — CMP14+EGFR
ALT: 25 IU/L (ref 0–32)
AST: 40 IU/L (ref 0–40)
Albumin/Globulin Ratio: 0.8 — ABNORMAL LOW (ref 1.2–2.2)
Albumin: 3.4 g/dL — ABNORMAL LOW (ref 3.5–4.8)
Alkaline Phosphatase: 130 IU/L — ABNORMAL HIGH (ref 39–117)
BUN/Creatinine Ratio: 12 (ref 12–28)
BUN: 13 mg/dL (ref 8–27)
Bilirubin Total: 0.7 mg/dL (ref 0.0–1.2)
CO2: 21 mmol/L (ref 20–29)
Calcium: 8.9 mg/dL (ref 8.7–10.3)
Chloride: 107 mmol/L — ABNORMAL HIGH (ref 96–106)
Creatinine, Ser: 1.1 mg/dL — ABNORMAL HIGH (ref 0.57–1.00)
GFR calc Af Amer: 56 mL/min/{1.73_m2} — ABNORMAL LOW (ref >59)
GFR calc non Af Amer: 49 mL/min/{1.73_m2} — ABNORMAL LOW (ref >59)
Globulin, Total: 4.4 g/dL (ref 1.5–4.5)
Glucose: 82 mg/dL (ref 65–99)
Potassium: 4 mmol/L (ref 3.5–5.2)
Sodium: 143 mmol/L (ref 134–144)
Total Protein: 7.8 g/dL (ref 6.0–8.5)

## 2017-10-17 LAB — LIPID PANEL
Chol/HDL Ratio: 2.2 ratio (ref 0.0–4.4)
Cholesterol, Total: 167 mg/dL (ref 100–199)
HDL: 77 mg/dL (ref >39)
LDL Calculated: 75 mg/dL (ref 0–99)
Triglycerides: 74 mg/dL (ref 0–149)
VLDL Cholesterol Cal: 15 mg/dL (ref 5–40)

## 2017-10-17 LAB — URIC ACID: Uric Acid: 6.7 mg/dL (ref 2.5–7.1)

## 2017-10-23 ENCOUNTER — Telehealth (INDEPENDENT_AMBULATORY_CARE_PROVIDER_SITE_OTHER): Payer: Self-pay | Admitting: Specialist

## 2017-10-23 NOTE — Telephone Encounter (Signed)
Patient called asked for a call back concerning her right knee. Patient said it is hurting more and getting worse. The number to contact patient is [phone]

## 2017-10-23 NOTE — Telephone Encounter (Signed)
Worked patient in on 10/24/17 @ 915

## 2017-10-24 ENCOUNTER — Encounter (INDEPENDENT_AMBULATORY_CARE_PROVIDER_SITE_OTHER): Payer: Self-pay | Admitting: Specialist

## 2017-10-24 ENCOUNTER — Ambulatory Visit (INDEPENDENT_AMBULATORY_CARE_PROVIDER_SITE_OTHER): Payer: Medicare Other

## 2017-10-24 ENCOUNTER — Ambulatory Visit (INDEPENDENT_AMBULATORY_CARE_PROVIDER_SITE_OTHER): Payer: Medicare Other | Admitting: Specialist

## 2017-10-24 VITALS — BP 150/87 | HR 96 | Ht 62.0 in | Wt 192.0 lb

## 2017-10-24 DIAGNOSIS — M25561 Pain in right knee: Secondary | ICD-10-CM

## 2017-10-24 DIAGNOSIS — Z96651 Presence of right artificial knee joint: Secondary | ICD-10-CM | POA: Diagnosis not present

## 2017-10-24 MED ORDER — TRAMADOL HCL 50 MG PO TABS: 50.0000 mg | ORAL_TABLET | Freq: Two times a day (BID) | ORAL | 0 refills | Status: DC | PRN

## 2017-10-24 NOTE — Progress Notes (Signed)
Office Visit Note   Patient: Jackie Parker           Date of Birth: 1940-10-28           MRN: [id number] Visit Date: 10/24/2017              Requested by: [name], MD [place], [place] [address] PCP: [name], MD   Assessment & Plan: Visit Diagnoses:  1. Right knee pain, unspecified chronicity   2. Status post total right knee replacement     Plan: Advised patient that the acute pain that she was having was likely related to acute gout attack.  This was managed conservatively with ice, colchicine, allopurinol and did resolve.  She has had some chronic intermittent pain since knee replacement centered around her patella.  If this continues to be problematic I would order a bone scan.  She will call let me know if this becomes more of an issue over the next several weeks.  All questions answered.  Given prescription for Ultram for acute pain.  Follow-Up Instructions: Return in about 6 months (around 04/23/2018).   Orders:  Orders Placed This Encounter  Procedures  . XR Knee 1-2 Views Right   Meds ordered this encounter  Medications  . traMADol (ULTRAM) 50 MG tablet    Sig: Take 1 tablet (50 mg total) by mouth every 12 (twelve) hours as needed.    Dispense:  50 tablet    Refill:  0      Procedures: No procedures performed   Clinical Data: No additional findings.   Subjective: Chief Complaint  Patient presents with  . Right Knee - Pain    HPI Patient comes in today with complaints of right anterior knee pain.  Status post right total knee replacement June 27, 2016.  States that recently she had acute on chronic right anterior knee pain after going to her OB/GYN and her legs were in stirrups.  She describes having prepatellar bursal swelling.  Increased knee pain with ambulation.  She use ice, colchicine, allopurinol and rubbed her knee with alcohol and states that it eventually improved.  Patient does have a  history of gout.  No  complaints of fever chills.  Currently she is feeling better.  Since her surgery she has had some chronic intermittent pain centered around the patella mostly around the quadriceps tendon and patellar tendon insertions. Review of Systems No current cardiac pulmonary GI GU issues  Objective: Vital Signs: BP (!) 150/87 (BP Location: Left Arm, Patient Position: Sitting)   Pulse 96   Ht 5' 2"  (1.575 m)   Wt 192 lb (87.1 kg)   BMI 35.12 kg/m   Physical Exam  Constitutional: She is oriented to person, place, and time. No distress.  HENT:  Head: Normocephalic and atraumatic.  Eyes: Pupils are equal, round, and reactive to light.  Neck: Normal range of motion.  Pulmonary/Chest: No respiratory distress.  Abdominal: She exhibits no distension.  Musculoskeletal:  Gait is normal.  Right knee range of motion about 0-100 and plus degrees.  No prepatellar bursal swelling.  No palpable effusion.  She is mildly tender at the quadriceps tendon and patella tendon insertions.  No obvious tendon defects.  Extensor mechanism intact.  No pain with quadriceps resistance.  Neurological: She is alert and oriented to person, place, and time.  Skin: Skin is warm and dry.  Psychiatric: She has a normal mood and affect.    Ortho Exam  Specialty Comments:  No specialty comments available.  Imaging: No results found.   PMFS History: Patient Active Problem List   Diagnosis Date Noted  . IUD check up 08/16/2017  . Asthma 01/30/2017  . Gynecologic exam normal 11/08/2016  . Urinary incontinence 11/08/2016  . Leukocytosis 06/29/2016  . Anemia 06/29/2016  . AKI (acute kidney injury) ([facility])   . Primary localized osteoarthritis of right knee 06/27/2016  . Osteoarthritis of left knee 12/14/2015    Class: End Stage  . Osteoarthritis of right knee 12/14/2015  . Acute renal failure ([facility]) 08/30/2013  . 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 N&V (nausea and vomiting) 08/30/2013  . Hypokalemia 08/30/2013  .  Postmenopausal bleeding 03/18/2013  . OSTEOARTHRITIS, KNEE, LEFT 03/20/2010  . TRICUSPID REGURGITATION, MILD 03/03/2010  . RENAL INSUFFICIENCY 03/02/2010  . MORBID OBESITY 02/18/2010  . PREMATURE ATRIAL CONTRACTIONS 02/18/2010  . PREMATURE VENTRICULAR CONTRACTIONS 02/18/2010  . CARDIAC MURMUR 02/18/2010  . EDEMA 10/20/2009  . HYPOKALEMIA 07/30/2009  . RIB PAIN, RIGHT SIDED 05/26/2008  . ALLERGIC RHINITIS 06/25/2007  . ABNORMAL BLOOD CHEMISTRY , OTHER 06/25/2007  . UNSPECIFIED VITAMIN D DEFICIENCY 04/30/2007  . Hypocalcemia 04/19/2007  . GLAUCOMA NEC 04/19/2007  . Essential hypertension 04/19/2007  . Asthma 04/19/2007  . Ulcerative colitis ([facility]) 04/19/2007  . DISORDER, MENSTRUAL NEC 04/19/2007  . PSORIASIS 04/19/2007  . 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 GOUT NOS 02/21/2006   Past Medical History:  Diagnosis Date  . AKI (acute kidney injury) ([facility]) 12/2015  . Allergy    takes Singulair and Zyrtec daily  . Asthma    uses Adair daily  . Cataracts, bilateral   . Colitis, ulcerative ([facility])   . DJD (degenerative joint disease)   . Glaucoma    uses eye drops daily  . History of blood transfusion    no abnormal reaction noted. 40+ yrs ago  . History of bronchitis    yrs ago  . History of gout    not on any meds  . Hypertension    takes Amlodipine daily  . Joint pain   . Joint swelling   . Psoriasis   . Urinary frequency    takes Ditropan daily  . Vertigo    doesn't take any meds  . Vitamin D deficiency     Family History  Problem Relation Age of Onset  . Hypertension Sister   . Glaucoma Sister     Past Surgical History:  Procedure Laterality Date  . BACK SURGERY    . COLONOSCOPY    . ILEOSTOMY    . KNEE ARTHROPLASTY Left 12/14/2015   Procedure: COMPUTER ASSISTED LEFT TOTAL KNEE ARTHROPLASTY;  Surgeon: Jessy Oto, MD;  Location: [facility];  Service: Orthopedics;  Laterality: Left;  . TOTAL KNEE ARTHROPLASTY Left 12/14/2015  . TOTAL KNEE ARTHROPLASTY Right  06/27/2016  . TOTAL KNEE ARTHROPLASTY Right 06/27/2016   Procedure: RIGHT TOTAL KNEE ARTHROPLASTY;  Surgeon: Jessy Oto, MD;  Location: [facility];  Service: Orthopedics;  Laterality: Right;  . TUBAL LIGATION     Social History   Occupational History  . Not on file  Tobacco Use  . Smoking status: Never Smoker  . Smokeless tobacco: Never Used  Substance and Sexual Activity  . Alcohol use: No    Alcohol/week: 0.0 oz  . Drug use: No  . Sexual activity: No

## 2017-11-09 DIAGNOSIS — H401412 Capsular glaucoma with pseudoexfoliation of lens, right eye, moderate stage: Secondary | ICD-10-CM | POA: Diagnosis not present

## 2017-11-09 DIAGNOSIS — H401122 Primary open-angle glaucoma, left eye, moderate stage: Secondary | ICD-10-CM | POA: Diagnosis not present

## 2017-11-15 ENCOUNTER — Telehealth: Payer: Self-pay | Admitting: Family Medicine

## 2017-11-15 NOTE — Telephone Encounter (Signed)
Amlodipine is on recall per patient. Patient was calling to see if the medication she is currently taking if it is included. Please fu.

## 2017-11-15 NOTE — Telephone Encounter (Signed)
Patient was called and informed that Amlodipine is not on recall.

## 2017-12-07 ENCOUNTER — Ambulatory Visit (INDEPENDENT_AMBULATORY_CARE_PROVIDER_SITE_OTHER): Payer: Medicare Other | Admitting: Specialist

## 2017-12-19 ENCOUNTER — Other Ambulatory Visit: Payer: Self-pay | Admitting: Family Medicine

## 2018-02-08 ENCOUNTER — Encounter: Payer: Self-pay | Admitting: Family Medicine

## 2018-02-08 ENCOUNTER — Ambulatory Visit: Payer: Medicare Other | Attending: Family Medicine | Admitting: Family Medicine

## 2018-02-08 DIAGNOSIS — E559 Vitamin D deficiency, unspecified: Secondary | ICD-10-CM | POA: Insufficient documentation

## 2018-02-08 DIAGNOSIS — Z9049 Acquired absence of other specified parts of digestive tract: Secondary | ICD-10-CM | POA: Diagnosis not present

## 2018-02-08 DIAGNOSIS — I10 Essential (primary) hypertension: Secondary | ICD-10-CM | POA: Insufficient documentation

## 2018-02-08 DIAGNOSIS — Z96653 Presence of artificial knee joint, bilateral: Secondary | ICD-10-CM | POA: Insufficient documentation

## 2018-02-08 DIAGNOSIS — Z882 Allergy status to sulfonamides status: Secondary | ICD-10-CM | POA: Insufficient documentation

## 2018-02-08 DIAGNOSIS — M170 Bilateral primary osteoarthritis of knee: Secondary | ICD-10-CM | POA: Insufficient documentation

## 2018-02-08 DIAGNOSIS — K5190 Ulcerative colitis, unspecified, without complications: Secondary | ICD-10-CM | POA: Insufficient documentation

## 2018-02-08 DIAGNOSIS — Z79899 Other long term (current) drug therapy: Secondary | ICD-10-CM | POA: Insufficient documentation

## 2018-02-08 DIAGNOSIS — J4541 Moderate persistent asthma with (acute) exacerbation: Secondary | ICD-10-CM | POA: Insufficient documentation

## 2018-02-08 DIAGNOSIS — H409 Unspecified glaucoma: Secondary | ICD-10-CM | POA: Diagnosis not present

## 2018-02-08 MED ORDER — IPRATROPIUM-ALBUTEROL 0.5-2.5 (3) MG/3ML IN SOLN
3.0000 mL | Freq: Once | RESPIRATORY_TRACT | Status: AC
  Administered 2018-02-08: 3 mL via RESPIRATORY_TRACT

## 2018-02-08 MED ORDER — ALBUTEROL SULFATE HFA 108 (90 BASE) MCG/ACT IN AERS
INHALATION_SPRAY | RESPIRATORY_TRACT | 3 refills | Status: DC
Start: 2018-02-08 — End: 2018-08-21

## 2018-02-08 MED ORDER — FLUTICASONE PROPIONATE HFA 110 MCG/ACT IN AERO: 2.0000 | INHALATION_SPRAY | Freq: Two times a day (BID) | RESPIRATORY_TRACT | 6 refills | Status: DC

## 2018-02-08 MED ORDER — METHYLPREDNISOLONE SODIUM SUCC 125 MG IJ SOLR
125.0000 mg | Freq: Once | INTRAMUSCULAR | Status: AC
  Administered 2018-02-08: 125 mg via INTRAMUSCULAR

## 2018-02-08 NOTE — Progress Notes (Signed)
Subjective:  Patient ID: Jackie Parker, female    DOB: July 25, 1941  Age: 77 y.o. MRN: [id number]  CC: Asthma   HPI Jackie Parker is a 77 year old female with Medical history significant for hypertension, ulcerative colitis status post total colectomy in 1977, Glaucoma bilateral knee osteoarthritis status post bilateral total knee replacement (left in 12/2015, right in 06/2016) here for an acute visit. She has been wheezing for the last one week, has had shortness of breath and is having to sleep sitting up in a chair. She has used her Proair with no relief and endorses being out of Flovent. Denies chest pain, cough, fever.   Past Medical History:  Diagnosis Date  . AKI (acute kidney injury) ([facility]) 12/2015  . Allergy    takes Singulair and Zyrtec daily  . Asthma    uses Adair daily  . Cataracts, bilateral   . Colitis, ulcerative ([place])   . DJD (degenerative joint disease)   . Glaucoma    uses eye drops daily  . History of blood transfusion    no abnormal reaction noted. 40+ yrs ago  . History of bronchitis    yrs ago  . History of gout    not on any meds  . Hypertension    takes Amlodipine daily  . Joint pain   . Joint swelling   . Psoriasis   . Urinary frequency    takes Ditropan daily  . Vertigo    doesn't take any meds  . Vitamin D deficiency     Past Surgical History:  Procedure Laterality Date  . BACK SURGERY    . COLONOSCOPY    . ILEOSTOMY    . KNEE ARTHROPLASTY Left 12/14/2015   Procedure: COMPUTER ASSISTED LEFT TOTAL KNEE ARTHROPLASTY;  Surgeon: Jessy Oto, MD;  Location: [facility];  Service: Orthopedics;  Laterality: Left;  . TOTAL KNEE ARTHROPLASTY Left 12/14/2015  . TOTAL KNEE ARTHROPLASTY Right 06/27/2016  . TOTAL KNEE ARTHROPLASTY Right 06/27/2016   Procedure: RIGHT TOTAL KNEE ARTHROPLASTY;  Surgeon: Jessy Oto, MD;  Location: [facility];  Service: Orthopedics;  Laterality: Right;  . TUBAL LIGATION      Allergies  Allergen Reactions  . Sulfonamide  Derivatives Rash     Outpatient Medications Prior to Visit  Medication Sig Dispense Refill  . amLODipine (NORVASC) 5 MG tablet Take 1 tablet (5 mg total) by mouth daily. 30 tablet 6  . brinzolamide (AZOPT) 1 % ophthalmic suspension Place 1 drop into the right eye 2 (two) times daily.    . cholecalciferol (VITAMIN D) 1000 units tablet Take 1,000 Units by mouth daily.    Marland Kitchen albuterol (PROAIR HFA) 108 (90 Base) MCG/ACT inhaler INHALE 2 PUFFS INTO THE LUNGS EVERY 6 HOURS AS NEEDED FOR WHEEZING OR SHORTNESS OF BREATH 8.5 g 3  . colchicine 0.6 MG tablet Take 1 tablet (0.6 mg total) by mouth 2 (two) times daily. (Patient not taking: Reported on 02/08/2018) 5 tablet 1  . LUMIGAN 0.01 % SOLN Place 1 drop into both eyes at bedtime.    . prochlorperazine (COMPAZINE) 10 MG tablet Take 10 mg by mouth 2 (two) times daily as needed. For nausea/vomiting.    Marland Kitchen Pumpkin Seed-Soy Germ (AZO BLADDER CONTROL/GO-LESS PO) Take 1-2 tablets by mouth daily as needed (bladder control).     . traMADol (ULTRAM) 50 MG tablet TAKE ONE-HALF TO 1 TABLET BY MOUTH THREE TIMES DAILY DURING THE DAY AS NEEDED FOR PAIN (Patient not taking: Reported on 02/08/2018) 30 tablet 0  .  traMADol (ULTRAM) 50 MG tablet Take 1 tablet (50 mg total) by mouth every 12 (twelve) hours as needed. (Patient not taking: Reported on 02/08/2018) 50 tablet 0   Facility-Administered Medications Prior to Visit  Medication Dose Route Frequency Provider Last Rate Last Dose  . indomethacin (INDOCIN) capsule 25 mg  25 mg Oral BID WC Jessy Oto, MD        ROS Review of Systems  Constitutional: Negative for activity change, appetite change and fatigue.  HENT: Negative for congestion, sinus pressure and sore throat.   Eyes: Negative for visual disturbance.  Respiratory: Positive for shortness of breath and wheezing. Negative for cough and chest tightness.   Cardiovascular: Negative for chest pain and palpitations.  Gastrointestinal: Negative for abdominal  distention, abdominal pain and constipation.  Endocrine: Negative for polydipsia.  Genitourinary: Negative for dysuria and frequency.  Musculoskeletal: Negative for arthralgias and back pain.  Skin: Negative for rash.  Neurological: Negative for tremors, light-headedness and numbness.  Hematological: Does not bruise/bleed easily.  Psychiatric/Behavioral: Negative for agitation and behavioral problems.    Objective:  BP (!) 147/82   Pulse 96   Temp 98.1 F (36.7 C) (Oral)   Ht 5' 2"  (1.575 m)   Wt 198 lb (89.8 kg)   SpO2 96%   BMI 36.21 kg/m   BP/Weight 02/08/2018 10/24/2017 03/13/6377  Systolic BP 588 502 774  Diastolic BP 82 87 71  Wt. (Lbs) 198 192 192  BMI 36.21 35.12 35.12      Physical Exam  Constitutional: She is oriented to person, place, and time. She appears well-developed and well-nourished.  Cardiovascular: Normal rate, normal heart sounds and intact distal pulses.  No murmur heard. Pulmonary/Chest: Effort normal. She has wheezes. She has no rales. She exhibits no tenderness.  Abdominal: Soft. Bowel sounds are normal. She exhibits no distension and no mass. There is no tenderness.  Musculoskeletal: Normal range of motion.  Neurological: She is alert and oriented to person, place, and time.     Assessment & Plan:   1. Moderate persistent asthma with exacerbation Triggered by running out of Flovent Nebulizer treatment administered and IM Solumedrol - fluticasone (FLOVENT HFA) 110 MCG/ACT inhaler; Inhale 2 puffs into the lungs 2 (two) times daily.  Dispense: 1 Inhaler; Refill: 6 - albuterol (PROAIR HFA) 108 (90 Base) MCG/ACT inhaler; INHALE 2 PUFFS INTO THE LUNGS EVERY 6 HOURS AS NEEDED FOR WHEEZING OR SHORTNESS OF BREATH  Dispense: 8.5 g; Refill: 3 - methylPREDNISolone sodium succinate (SOLU-MEDROL) 125 mg/2 mL injection 125 mg - ipratropium-albuterol (DUONEB) 0.5-2.5 (3) MG/3ML nebulizer solution 3 mL   Meds ordered this encounter  Medications  .  fluticasone (FLOVENT HFA) 110 MCG/ACT inhaler    Sig: Inhale 2 puffs into the lungs 2 (two) times daily.    Dispense:  1 Inhaler    Refill:  6  . albuterol (PROAIR HFA) 108 (90 Base) MCG/ACT inhaler    Sig: INHALE 2 PUFFS INTO THE LUNGS EVERY 6 HOURS AS NEEDED FOR WHEEZING OR SHORTNESS OF BREATH    Dispense:  8.5 g    Refill:  3  . methylPREDNISolone sodium succinate (SOLU-MEDROL) 125 mg/2 mL injection 125 mg  . ipratropium-albuterol (DUONEB) 0.5-2.5 (3) MG/3ML nebulizer solution 3 mL    Follow-up: Return for Follow-up of chronic medical conditions, keep previously scheduled appointment.   [name] MD

## 2018-02-08 NOTE — Patient Instructions (Signed)
Asthma Attack Prevention, Adult Although you may not be able to control the fact that you have asthma, you can take actions to prevent episodes of asthma (asthma attacks). These actions include:  Creating a written plan for managing and treating your asthma attacks (asthma action plan).  Monitoring your asthma.  Avoiding things that can irritate your airways or make your asthma symptoms worse (asthma triggers).  Taking your medicines as directed.  Acting quickly if you have signs or symptoms of an asthma attack.  What are some ways to prevent an asthma attack? Create a plan Work with your health care provider to create an asthma action plan. This plan should include:  A list of your asthma triggers and how to avoid them.  A list of symptoms that you experience during an asthma attack.  Information about when to take medicine and how much medicine to take.  Information to help you understand your peak flow measurements.  Contact information for your health care providers.  Daily actions that you can take to control asthma.  Monitor your asthma  To monitor your asthma:  Use your peak flow meter every morning and every evening for 2-3 weeks. Record the results in a journal. A drop in your peak flow numbers on one or more days may mean that you are starting to have an asthma attack, even if you are not having symptoms.  When you have asthma symptoms, write them down in a journal.  Avoid asthma triggers  Work with your health care provider to find out what your asthma triggers are. This can be done by:  Being tested for allergies.  Keeping a journal that notes when asthma attacks occur and what may have contributed to them.  Asking your health care provider whether other medical conditions make your asthma worse.  Common asthma triggers include:  Dust.  Smoke. This includes campfire smoke and secondhand smoke from tobacco products.  Pet dander.  Trees, grasses or  pollens.  Very cold, dry, or humid air.  Mold.  Foods that contain high amounts of sulfites.  Strong smells.  Engine exhaust and air pollution.  Aerosol sprays and fumes from household cleaners.  Household pests and their droppings, including dust mites and cockroaches.  Certain medicines, including NSAIDs.  Once you have determined your asthma triggers, take steps to avoid them. Depending on your triggers, you may be able to reduce the chance of an asthma attack by:  Keeping your home clean. Have someone dust and vacuum your home for you 1 or 2 times a week. If possible, have them use a high-efficiency particulate arrestance (HEPA) vacuum.  Washing your sheets weekly in hot water.  Using allergy-proof mattress covers and casings on your bed.  Keeping pets out of your home.  Taking care of mold and water problems in your home.  Avoiding areas where people smoke.  Avoiding using strong perfumes or odor sprays.  Avoid spending a lot of time outdoors when pollen counts are high and on very windy days.  Talking with your health care provider before stopping or starting any new medicines.  Medicines Take over-the-counter and prescription medicines only as told by your health care provider. Many asthma attacks can be prevented by carefully following your medicine schedule. Taking your medicines correctly is especially important when you cannot avoid certain asthma triggers. Even if you are doing well, do not stop taking your medicine and do not take less medicine. Act quickly If an asthma attack happens, acting quickly  can decrease how severe it is and how long it lasts. Take these actions:  Pay attention to your symptoms. If you are coughing, wheezing, or having difficulty breathing, do not wait to see if your symptoms go away on their own. Follow your asthma action plan.  If you have followed your asthma action plan and your symptoms are not improving, call your health care  provider or seek immediate medical care at the nearest hospital.  It is important to write down how often you need to use your fast-acting rescue inhaler. You can track how often you use an inhaler in your journal. If you are using your rescue inhaler more often, it may mean that your asthma is not under control. Adjusting your asthma treatment plan may help you to prevent future asthma attacks and help you to gain better control of your condition. How can I prevent an asthma attack when I exercise?  Exercise is a common asthma trigger. To prevent asthma attacks during exercise:  Follow advice from your health care provider about whether you should use your fast-acting inhaler before exercising. Many people with asthma experience exercise-induced bronchoconstriction (EIB). This condition often worsens during vigorous exercise in cold, humid, or dry environments. Usually, people with EIB can stay very active by using a fast-acting inhaler before exercising.  Avoid exercising outdoors in very cold or humid weather.  Avoid exercising outdoors when pollen counts are high.  Warm up and cool down when exercising.  Stop exercising right away if asthma symptoms start.  Consider taking part in exercises that are less likely to cause asthma symptoms such as:  Indoor swimming.  Biking.  Walking.  Hiking.  Playing football.  This information is not intended to replace advice given to you by your health care provider. Make sure you discuss any questions you have with your health care provider. Document Released: 08/17/2009 Document Revised: 04/29/2016 Document Reviewed: 02/13/2016 Elsevier Interactive Patient Education  [employer].

## 2018-03-07 DIAGNOSIS — H401122 Primary open-angle glaucoma, left eye, moderate stage: Secondary | ICD-10-CM | POA: Diagnosis not present

## 2018-03-07 DIAGNOSIS — H401412 Capsular glaucoma with pseudoexfoliation of lens, right eye, moderate stage: Secondary | ICD-10-CM | POA: Diagnosis not present

## 2018-03-22 ENCOUNTER — Emergency Department (HOSPITAL_COMMUNITY)
Admission: EM | Admit: 2018-03-22 | Discharge: 2018-03-22 | Disposition: A | Discharge status: Home / Self Care | Payer: Medicare Other | Attending: Emergency Medicine | Admitting: Emergency Medicine

## 2018-03-22 ENCOUNTER — Other Ambulatory Visit: Payer: Self-pay

## 2018-03-22 ENCOUNTER — Encounter (HOSPITAL_COMMUNITY): Payer: Self-pay

## 2018-03-22 ENCOUNTER — Emergency Department (HOSPITAL_COMMUNITY): Payer: Medicare Other

## 2018-03-22 DIAGNOSIS — R079 Chest pain, unspecified: Secondary | ICD-10-CM | POA: Diagnosis not present

## 2018-03-22 DIAGNOSIS — R0789 Other chest pain: Secondary | ICD-10-CM | POA: Insufficient documentation

## 2018-03-22 DIAGNOSIS — J45909 Unspecified asthma, uncomplicated: Secondary | ICD-10-CM | POA: Diagnosis not present

## 2018-03-22 DIAGNOSIS — Z79899 Other long term (current) drug therapy: Secondary | ICD-10-CM | POA: Insufficient documentation

## 2018-03-22 LAB — BASIC METABOLIC PANEL
Anion gap: 8 (ref 5–15)
BUN: 15 mg/dL (ref 8–23)
CO2: 23 mmol/L (ref 22–32)
Calcium: 8.6 mg/dL — ABNORMAL LOW (ref 8.9–10.3)
Chloride: 109 mmol/L (ref 98–111)
Creatinine, Ser: 1.42 mg/dL — ABNORMAL HIGH (ref 0.44–1.00)
GFR calc Af Amer: 40 mL/min — ABNORMAL LOW (ref >60)
GFR calc non Af Amer: 35 mL/min — ABNORMAL LOW (ref >60)
Glucose, Bld: 94 mg/dL (ref 70–99)
Potassium: 4 mmol/L (ref 3.5–5.1)
Sodium: 140 mmol/L (ref 135–145)

## 2018-03-22 LAB — CBC
HCT: 47.5 % — ABNORMAL HIGH (ref 36.0–46.0)
Hemoglobin: 15.4 g/dL — ABNORMAL HIGH (ref 12.0–15.0)
MCH: 30.7 pg (ref 26.0–34.0)
MCHC: 32.4 g/dL (ref 30.0–36.0)
MCV: 94.6 fL (ref 78.0–100.0)
Platelets: 160 10*3/uL (ref 150–400)
RBC: 5.02 MIL/uL (ref 3.87–5.11)
RDW: 13.1 % (ref 11.5–15.5)
WBC: 8.1 10*3/uL (ref 4.0–10.5)

## 2018-03-22 LAB — I-STAT TROPONIN, ED
Troponin i, poc: 0 ng/mL (ref 0.00–0.08)
Troponin i, poc: 0 ng/mL (ref 0.00–0.08)

## 2018-03-22 NOTE — ED Notes (Signed)
Patient transported to X-ray 

## 2018-03-22 NOTE — ED Notes (Signed)
Patient verbalizes understanding of discharge instructions. Opportunity for questioning and answers were provided. Armband removed by staff, pt discharged from ED.  

## 2018-03-22 NOTE — Discharge Instructions (Addendum)
Return for any problem.  Follow-up with your regular doctor as instructed.

## 2018-03-22 NOTE — ED Provider Notes (Signed)
[facility] EMERGENCY DEPARTMENT Provider Note   CSN: [id number] Arrival date & time: 03/22/18  9323     History   Chief Complaint Chief Complaint  Patient presents with  . Chest Pain    HPI Jackie Parker is a 77 y.o. female.  77 year old female with prior history as detailed below presents with complaint of anterior chest discomfort.  Patient reports that when she lifts something heavy or moves her arms her lateral chest hurts.  She describes an achy pain.  This is been ongoing for the last several days.  She denies any discomfort at time of the evaluation.  She denies any prior history of cardiac disease.  She denies any exertional component to her symptoms.  She denies associated shortness of breath, nausea, vomiting, diaphoresis, or other acute complaint.  The history is provided by the patient.  Chest Pain   This is a new problem. The current episode started more than 2 days ago. The problem occurs rarely. The problem has been resolved. The pain is associated with movement and raising an arm. The pain is present in the lateral region. The pain is mild. The quality of the pain is described as brief. The pain does not radiate. The symptoms are aggravated by certain positions. Pertinent negatives include no exertional chest pressure and no shortness of breath.    Past Medical History:  Diagnosis Date  . AKI (acute kidney injury) ([facility]) 12/2015  . Allergy    takes Singulair and Zyrtec daily  . Asthma    uses Adair daily  . Cataracts, bilateral   . Colitis, ulcerative ([facility])   . DJD (degenerative joint disease)   . Glaucoma    uses eye drops daily  . History of blood transfusion    no abnormal reaction noted. 40+ yrs ago  . History of bronchitis    yrs ago  . History of gout    not on any meds  . Hypertension    takes Amlodipine daily  . Joint pain   . Joint swelling   . Psoriasis   . Urinary frequency    takes Ditropan daily  . Vertigo    doesn't take any meds  . Vitamin D deficiency     Patient Active Problem List   Diagnosis Date Noted  . IUD check up 08/16/2017  . Asthma 01/30/2017  . Gynecologic exam normal 11/08/2016  . Urinary incontinence 11/08/2016  . Leukocytosis 06/29/2016  . Anemia 06/29/2016  . AKI (acute kidney injury) ([facility])   . Primary localized osteoarthritis of right knee 06/27/2016  . Osteoarthritis of left knee 12/14/2015    Class: End Stage  . Osteoarthritis of right knee 12/14/2015  . Acute renal failure ([facility]) 08/30/2013  . 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 N&V (nausea and vomiting) 08/30/2013  . Hypokalemia 08/30/2013  . Postmenopausal bleeding 03/18/2013  . OSTEOARTHRITIS, KNEE, LEFT 03/20/2010  . TRICUSPID REGURGITATION, MILD 03/03/2010  . RENAL INSUFFICIENCY 03/02/2010  . MORBID OBESITY 02/18/2010  . PREMATURE ATRIAL CONTRACTIONS 02/18/2010  . PREMATURE VENTRICULAR CONTRACTIONS 02/18/2010  . CARDIAC MURMUR 02/18/2010  . EDEMA 10/20/2009  . HYPOKALEMIA 07/30/2009  . RIB PAIN, RIGHT SIDED 05/26/2008  . ALLERGIC RHINITIS 06/25/2007  . ABNORMAL BLOOD CHEMISTRY , OTHER 06/25/2007  . UNSPECIFIED VITAMIN D DEFICIENCY 04/30/2007  . Hypocalcemia 04/19/2007  . GLAUCOMA NEC 04/19/2007  . Essential hypertension 04/19/2007  . Asthma 04/19/2007  . Ulcerative colitis ([facility]) 04/19/2007  . DISORDER, MENSTRUAL NEC 04/19/2007  . PSORIASIS 04/19/2007  .  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 GOUT NOS 02/21/2006    Past Surgical History:  Procedure Laterality Date  . BACK SURGERY    . COLONOSCOPY    . ILEOSTOMY    . KNEE ARTHROPLASTY Left 12/14/2015   Procedure: COMPUTER ASSISTED LEFT TOTAL KNEE ARTHROPLASTY;  Surgeon: Jessy Oto, MD;  Location: [facility];  Service: Orthopedics;  Laterality: Left;  . TOTAL KNEE ARTHROPLASTY Left 12/14/2015  . TOTAL KNEE ARTHROPLASTY Right 06/27/2016  . TOTAL KNEE ARTHROPLASTY Right 06/27/2016   Procedure: RIGHT TOTAL KNEE ARTHROPLASTY;  Surgeon:  Jessy Oto, MD;  Location: [facility];  Service: Orthopedics;  Laterality: Right;  . TUBAL LIGATION       OB History    Gravida  5   Para      Term      Preterm      AB      Living        SAB      TAB      Ectopic      Multiple      Live Births  5            Home Medications    Prior to Admission medications   Medication Sig Start Date End Date Taking? Authorizing Provider  albuterol (PROAIR HFA) 108 (90 Base) MCG/ACT inhaler INHALE 2 PUFFS INTO THE LUNGS EVERY 6 HOURS AS NEEDED FOR WHEEZING OR SHORTNESS OF BREATH 02/08/18   [name], MD  amLODipine (NORVASC) 5 MG tablet Take 1 tablet (5 mg total) by mouth daily. 10/11/17   [name], MD  brinzolamide (AZOPT) 1 % ophthalmic suspension Place 1 drop into the right eye 2 (two) times daily.    [provider]  cholecalciferol (VITAMIN D) 1000 units tablet Take 1,000 Units by mouth daily.    [provider]  colchicine 0.6 MG tablet Take 1 tablet (0.6 mg total) by mouth 2 (two) times daily. Patient not taking: Reported on 02/08/2018 12/14/16   Jessy Oto, MD  fluticasone (FLOVENT HFA) 110 MCG/ACT inhaler Inhale 2 puffs into the lungs 2 (two) times daily. 02/08/18   Newlin, Enobong, MD  LUMIGAN 0.01 % SOLN Place 1 drop into both eyes at bedtime. 05/27/16   [provider]  prochlorperazine (COMPAZINE) 10 MG tablet Take 10 mg by mouth 2 (two) times daily as needed. For nausea/vomiting. 04/11/16   [provider]  Pumpkin Seed-Soy Germ (AZO BLADDER CONTROL/GO-LESS PO) Take 1-2 tablets by mouth daily as needed (bladder control).     [provider]  traMADol (ULTRAM) 50 MG tablet TAKE ONE-HALF TO 1 TABLET BY MOUTH THREE TIMES DAILY DURING THE DAY AS NEEDED FOR PAIN Patient not taking: Reported on 02/08/2018 05/22/17   Jessy Oto, MD  traMADol (ULTRAM) 50 MG tablet Take 1 tablet (50 mg total) by mouth every 12 (twelve) hours as needed. Patient not taking: Reported on  02/08/2018 10/24/17   Lanae Crumbly, PA-C    Family History Family History  Problem Relation Age of Onset  . Hypertension Sister   . Glaucoma Sister     Social History Social History   Tobacco Use  . Smoking status: Never Smoker  . Smokeless tobacco: Never Used  Substance Use Topics  . Alcohol use: No    Alcohol/week: 0.0 oz  . Drug use: No     Allergies   Sulfonamide derivatives   Review of Systems Review of Systems  Respiratory: Negative for shortness of  breath.   Cardiovascular: Positive for chest pain.  All other systems reviewed and are negative.    Physical Exam Updated Vital Signs BP (!) 132/57   Pulse (!) 104   Temp 98.1 F (36.7 C) (Oral)   Resp (!) 24   Ht 5' 2"  (1.575 m)   Wt 89.4 kg (197 lb)   SpO2 97%   BMI 36.03 kg/m   Physical Exam  Constitutional: She is oriented to person, place, and time. She appears well-developed and well-nourished. No distress.  HENT:  Head: Normocephalic and atraumatic.  Mouth/Throat: Oropharynx is clear and moist.  Eyes: Pupils are equal, round, and reactive to light. Conjunctivae and EOM are normal.  Neck: Normal range of motion. Neck supple.  Cardiovascular: Normal rate, regular rhythm and normal heart sounds.  Anterior chest wall is tender with palpation.  This appears to reproduce the majority of the patient's reported symptoms.  Pulmonary/Chest: Effort normal and breath sounds normal. No respiratory distress.  Abdominal: Soft. She exhibits no distension. There is no tenderness.  Musculoskeletal: Normal range of motion. She exhibits no edema or deformity.  Neurological: She is alert and oriented to person, place, and time.  Skin: Skin is warm and dry.  Psychiatric: She has a normal mood and affect.  Nursing note and vitals reviewed.    ED Treatments / Results  Labs (all labs ordered are listed, but only abnormal results are displayed) Labs Reviewed  CBC - Abnormal; Notable for the following components:        Result Value   Hemoglobin 15.4 (*)    HCT 47.5 (*)    All other components within normal limits  BASIC METABOLIC PANEL - Abnormal; Notable for the following components:   Creatinine, Ser 1.42 (*)    Calcium 8.6 (*)    GFR calc non Af Amer 35 (*)    GFR calc Af Amer 40 (*)    All other components within normal limits  I-STAT TROPONIN, ED  I-STAT TROPONIN, ED    EKG EKG Interpretation  Date/Time:  Thursday March 22 2018 09:48:50 EDT Ventricular Rate:  114 PR Interval:  142 QRS Duration: 80 QT Interval:  342 QTC Calculation: 471 R Axis:   -9 Text Interpretation:  Sinus tachycardia with Premature atrial complexes Minimal voltage criteria for LVH, may be normal variant Borderline ECG Confirmed by Dene Gentry [phone]) on 03/22/2018 10:15:53 AM Also confirmed by Dene Gentry [phone]), editor Hattie Perch [phone])  on 03/22/2018 10:23:43 AM   Radiology Dg Chest 2 View  Result Date: 03/22/2018 CLINICAL DATA:  Left arm and chest pain. EXAM: CHEST - 2 VIEW COMPARISON:  Chest x-ray dated Feb 04, 2017. FINDINGS: The heart remains borderline enlarged. Normal pulmonary vascularity. No focal consolidation, pleural effusion, or pneumothorax. No acute osseous abnormality. IMPRESSION: No active cardiopulmonary disease. Electronically Signed   By: Titus Dubin M.D.   On: 03/22/2018 10:19    Procedures Procedures (including critical care time)  Medications Ordered in ED Medications - No data to display   Initial Impression / Assessment and Plan / ED Course  I have reviewed the triage vital signs and the nursing notes.  Pertinent labs & imaging results that were available during my care of the patient were reviewed by me and considered in my medical decision making (see chart for details).     MDM  Screen complete  Patient is presenting for evaluation of atypical chest pain.  EKG is not suggestive of acute ischemia.  Troponins x2 is negative.  Heart score is 3.  Other  screening labs that are obtained do not suggest other acute pathology.  Patient feels improved following her ED work-up.  She desires discharge home.  She declines further observation in the ED and/or admission for further work-up.  She understands need for close follow-up.  Strict return precautions are given and understood.  Final Clinical Impressions(s) / ED Diagnoses   Final diagnoses:  Atypical chest pain    ED Discharge Orders    None       Valarie Merino, MD 03/22/18 1416

## 2018-03-22 NOTE — ED Triage Notes (Signed)
Pt presents for evaluation of chest pain generalized across chest, worse with movement. States bilateral arms are hurting. States has been intermittent x 2 days. Denies recent heavy lifting or pushing.

## 2018-04-25 ENCOUNTER — Ambulatory Visit (INDEPENDENT_AMBULATORY_CARE_PROVIDER_SITE_OTHER): Payer: Medicare Other | Admitting: Surgery

## 2018-04-30 DIAGNOSIS — H401122 Primary open-angle glaucoma, left eye, moderate stage: Secondary | ICD-10-CM | POA: Diagnosis not present

## 2018-04-30 DIAGNOSIS — H401412 Capsular glaucoma with pseudoexfoliation of lens, right eye, moderate stage: Secondary | ICD-10-CM | POA: Diagnosis not present

## 2018-05-09 IMAGING — CR DG CHEST 2V
2 series · 2 of 2 positions shown · non-contrast
Comparison: Chest radiograph performed 06/29/2016

CLINICAL DATA: Acute onset of nausea and vomiting. Central chest
pain. Initial encounter.

EXAM:
CHEST  2 VIEW

[chest pa]
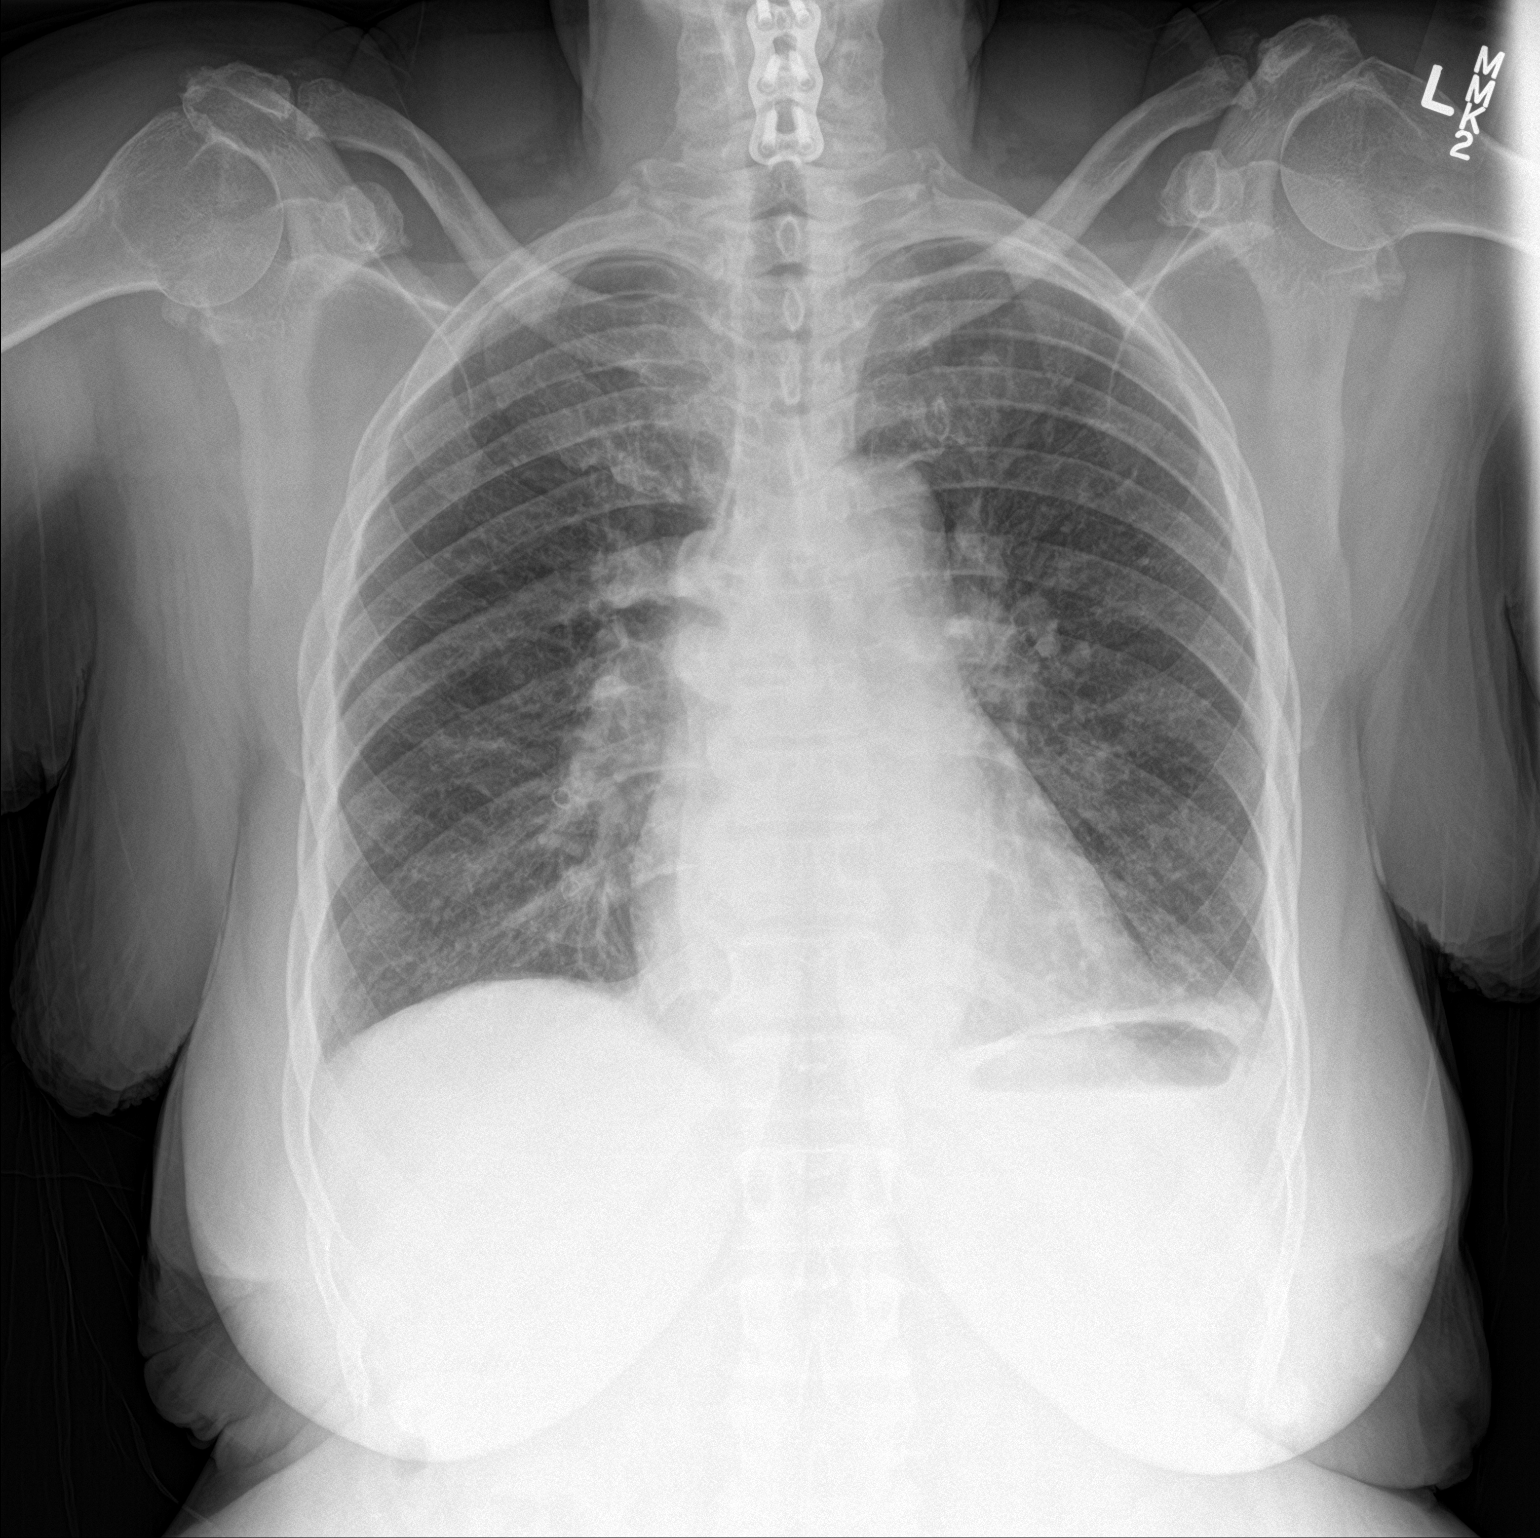

[chest lat]
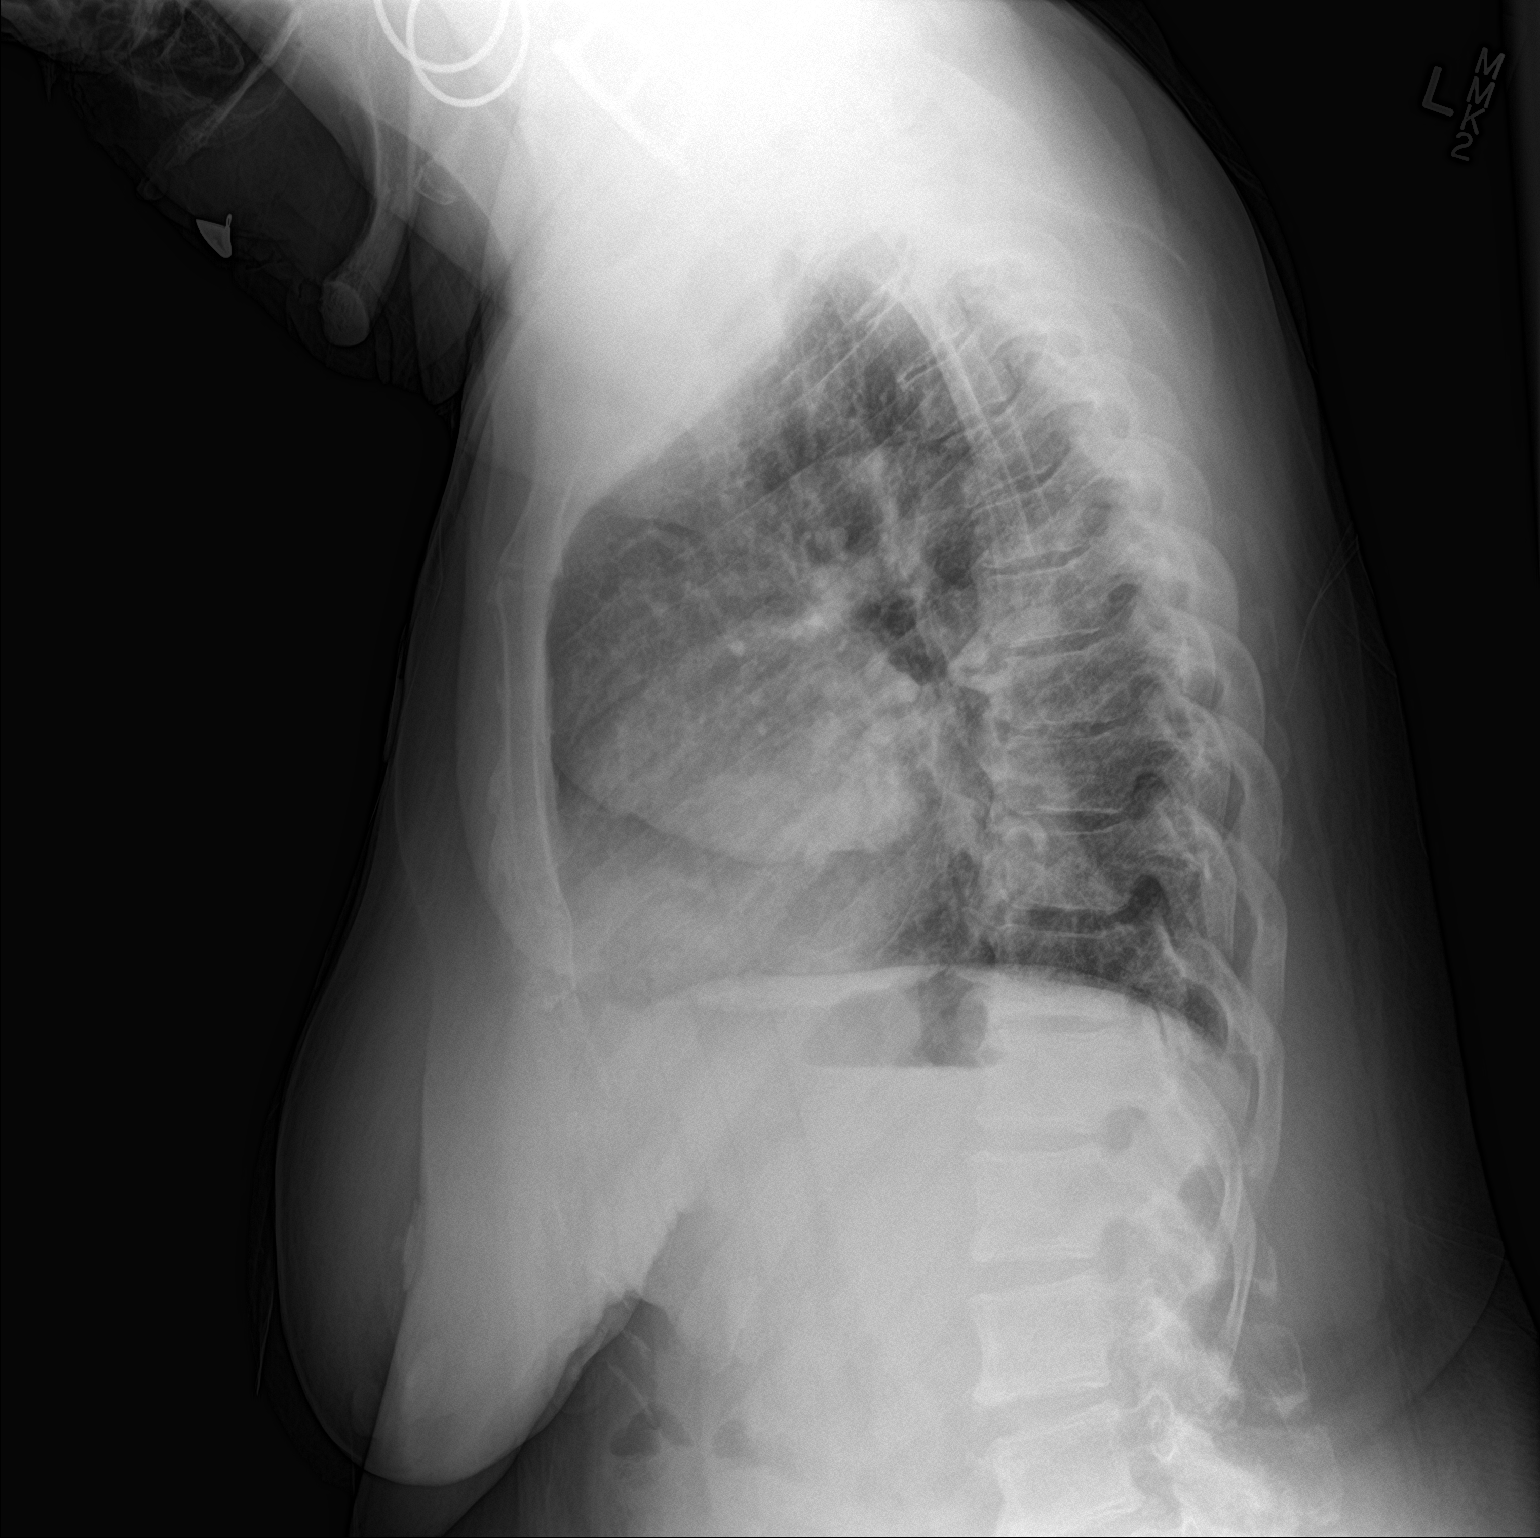

[2 of 2 positions shown; findings below may reference images not displayed]

FINDINGS: The lungs are well-aerated. Peribronchial thickening is noted.
Increased interstitial markings are nonspecific. Mild left basilar
airspace opacity likely reflects atelectasis. There is no evidence
of pleural effusion or pneumothorax.

The heart is borderline normal in size. No acute osseous
abnormalities are seen.
IMPRESSION: Peribronchial thickening. Increased interstitial markings noted.
Mild left basilar airspace opacity likely reflects atelectasis.

## 2018-05-22 ENCOUNTER — Encounter: Payer: Self-pay | Admitting: Family Medicine

## 2018-05-22 ENCOUNTER — Ambulatory Visit: Payer: Medicare Other | Attending: Family Medicine | Admitting: Family Medicine

## 2018-05-22 VITALS — BP 112/77 | HR 85 | Temp 98.3°F | Ht 62.0 in | Wt 207.4 lb

## 2018-05-22 DIAGNOSIS — I10 Essential (primary) hypertension: Secondary | ICD-10-CM | POA: Diagnosis not present

## 2018-05-22 DIAGNOSIS — R252 Cramp and spasm: Secondary | ICD-10-CM | POA: Diagnosis not present

## 2018-05-22 DIAGNOSIS — R600 Localized edema: Secondary | ICD-10-CM | POA: Diagnosis not present

## 2018-05-22 DIAGNOSIS — Z96653 Presence of artificial knee joint, bilateral: Secondary | ICD-10-CM | POA: Diagnosis not present

## 2018-05-22 DIAGNOSIS — Z79891 Long term (current) use of opiate analgesic: Secondary | ICD-10-CM | POA: Diagnosis not present

## 2018-05-22 DIAGNOSIS — Z882 Allergy status to sulfonamides status: Secondary | ICD-10-CM | POA: Diagnosis not present

## 2018-05-22 DIAGNOSIS — N3946 Mixed incontinence: Secondary | ICD-10-CM | POA: Diagnosis not present

## 2018-05-22 DIAGNOSIS — Z79899 Other long term (current) drug therapy: Secondary | ICD-10-CM | POA: Insufficient documentation

## 2018-05-22 DIAGNOSIS — Z23 Encounter for immunization: Secondary | ICD-10-CM | POA: Insufficient documentation

## 2018-05-22 MED ORDER — HYDROCHLOROTHIAZIDE 25 MG PO TABS: 25.0000 mg | ORAL_TABLET | Freq: Every day | ORAL | 6 refills | Status: DC

## 2018-05-22 MED ORDER — OXYBUTYNIN CHLORIDE 5 MG PO TABS: 5.0000 mg | ORAL_TABLET | Freq: Two times a day (BID) | ORAL | 6 refills | Status: DC

## 2018-05-22 MED ORDER — TIZANIDINE HCL 4 MG PO TABS: 4.0000 mg | ORAL_TABLET | Freq: Every day | ORAL | 1 refills | Status: DC

## 2018-05-22 MED ORDER — PROCHLORPERAZINE MALEATE 10 MG PO TABS: 10.0000 mg | ORAL_TABLET | Freq: Two times a day (BID) | ORAL | 0 refills | Status: DC | PRN

## 2018-05-22 NOTE — Progress Notes (Signed)
Both feet are swelling.

## 2018-05-22 NOTE — Patient Instructions (Signed)
Edema Edema is when you have too much fluid in your body or under your skin. Edema may make your legs, feet, and ankles swell up. Swelling is also common in looser tissues, like around your eyes. This is a common condition. It gets more common as you get older. There are many possible causes of edema. Eating too much salt (sodium) and being on your feet or sitting for a long time can cause edema in your legs, feet, and ankles. Hot weather may make edema worse. Edema is usually painless. Your skin may look swollen or shiny. Follow these instructions at home:  Keep the swollen body part raised (elevated) above the level of your heart when you are sitting or lying down.  Do not sit still or stand for a long time.  Do not wear tight clothes. Do not wear garters on your upper legs.  Exercise your legs. This can help the swelling go down.  Wear elastic bandages or support stockings as told by your doctor.  Eat a low-salt (low-sodium) diet to reduce fluid as told by your doctor.  Depending on the cause of your swelling, you may need to limit how much fluid you drink (fluid restriction).  Take over-the-counter and prescription medicines only as told by your doctor. Contact a doctor if:  Treatment is not working.  You have heart, liver, or kidney disease and have symptoms of edema.  You have sudden and unexplained weight gain. Get help right away if:  You have shortness of breath or chest pain.  You cannot breathe when you lie down.  You have pain, redness, or warmth in the swollen areas.  You have heart, liver, or kidney disease and get edema all of a sudden.  You have a fever and your symptoms get worse all of a sudden. Summary  Edema is when you have too much fluid in your body or under your skin.  Edema may make your legs, feet, and ankles swell up. Swelling is also common in looser tissues, like around your eyes.  Raise (elevate) the swollen body part above the level of your  heart when you are sitting or lying down.  Follow your doctor's instructions about diet and how much fluid you can drink (fluid restriction). This information is not intended to replace advice given to you by your health care provider. Make sure you discuss any questions you have with your health care provider. Document Released: 02/15/2008 Document Revised: 09/16/2016 Document Reviewed: 09/16/2016 Elsevier Interactive Patient Education  2017 [employer].

## 2018-05-24 NOTE — Progress Notes (Signed)
Subjective:  Patient ID: Jackie Parker, female    DOB: February 13, 1941  Age: 77 y.o. 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 She complains of bilateral pedal edema which is worse in the left leg and has been noticed to improve slightly on elevation of the last two and a half weeks.  She denies presence of pain in her legs or increased sodium intake. She has sharp pains when she attempts to cross her arms to the contralateral side and pain occurs in her upper arm and neck but is absent at rest.  Has also noticed cramping in her feet which occur at night and is noted to decrease when she uses a vinegar. Denies history of trauma. Her ulcerative colitis has been stable and so has had glaucoma. Doing well on her antihypertensive with no complaints of adverse effects.  Past Medical History:  Diagnosis Date  . AKI (acute kidney injury) ([place]) 12/2015  . Allergy    takes Singulair and Zyrtec daily  . Asthma    uses Adair daily  . Cataracts, bilateral   . Colitis, ulcerative ([place])   . DJD (degenerative joint disease)   . Glaucoma    uses eye drops daily  . History of blood transfusion    no abnormal reaction noted. 40+ yrs ago  . History of bronchitis    yrs ago  . History of gout    not on any meds  . Hypertension    takes Amlodipine daily  . Joint pain   . Joint swelling   . Psoriasis   . Urinary frequency    takes Ditropan daily  . Vertigo    doesn't take any meds  . Vitamin D deficiency     Past Surgical History:  Procedure Laterality Date  . BACK SURGERY    . COLONOSCOPY    . ILEOSTOMY    . KNEE ARTHROPLASTY Left 12/14/2015   Procedure: COMPUTER ASSISTED LEFT TOTAL KNEE ARTHROPLASTY;   Surgeon: Jessy Oto, MD;  Location: [facility];  Service: Orthopedics;  Laterality: Left;  . TOTAL KNEE ARTHROPLASTY Left 12/14/2015  . TOTAL KNEE ARTHROPLASTY Right 06/27/2016  . TOTAL KNEE ARTHROPLASTY Right 06/27/2016   Procedure: RIGHT TOTAL KNEE ARTHROPLASTY;  Surgeon: Jessy Oto, MD;  Location: [facility];  Service: Orthopedics;  Laterality: Right;  . TUBAL LIGATION      Allergies  Allergen Reactions  . Sulfonamide Derivatives Rash     Outpatient Medications Prior to Visit  Medication Sig Dispense Refill  . albuterol (PROAIR HFA) 108 (90 Base) MCG/ACT inhaler INHALE 2 PUFFS INTO THE LUNGS EVERY 6 HOURS AS NEEDED FOR WHEEZING OR SHORTNESS OF BREATH 8.5 g 3  . brinzolamide (AZOPT) 1 % ophthalmic suspension Place 1 drop into the right eye 2 (two) times daily.    . cholecalciferol (VITAMIN D) 1000 units tablet Take 1,000 Units by mouth daily.    . fluticasone (FLOVENT HFA) 110 MCG/ACT inhaler Inhale 2 puffs into the lungs 2 (two) times daily. 1 Inhaler 6  . LUMIGAN 0.01 % SOLN Place 1 drop into both eyes at bedtime.    Marland Kitchen amLODipine (NORVASC) 5 MG tablet Take 1 tablet (5 mg total) by mouth daily. 30 tablet 6  . prochlorperazine (COMPAZINE) 10 MG tablet  Take 10 mg by mouth 2 (two) times daily as needed. For nausea/vomiting.    . colchicine 0.6 MG tablet Take 1 tablet (0.6 mg total) by mouth 2 (two) times daily. (Patient not taking: Reported on 02/08/2018) 5 tablet 1  . Pumpkin Seed-Soy Germ (AZO BLADDER CONTROL/GO-LESS PO) Take 1-2 tablets by mouth daily as needed (bladder control).     . traMADol (ULTRAM) 50 MG tablet TAKE ONE-HALF TO 1 TABLET BY MOUTH THREE TIMES DAILY DURING THE DAY AS NEEDED FOR PAIN (Patient not taking: Reported on 02/08/2018) 30 tablet 0  . traMADol (ULTRAM) 50 MG tablet Take 1 tablet (50 mg total) by mouth every 12 (twelve) hours as needed. (Patient not taking: Reported on 02/08/2018) 50 tablet 0   Facility-Administered Medications Prior to Visit  Medication Dose Route  Frequency Provider Last Rate Last Dose  . indomethacin (INDOCIN) capsule 25 mg  25 mg Oral BID WC Jessy Oto, MD        ROS Review of Systems  Constitutional: Negative for activity change, appetite change and fatigue.  HENT: Negative for congestion, sinus pressure and sore throat.   Eyes: Negative for visual disturbance.  Respiratory: Negative for cough, chest tightness, shortness of breath and wheezing.   Cardiovascular: Negative for chest pain and palpitations.  Gastrointestinal: Negative for abdominal distention, abdominal pain and constipation.  Endocrine: Negative for polydipsia.  Genitourinary: Negative for dysuria and frequency.  Musculoskeletal:       See hpi  Skin: Negative for rash.  Neurological: Negative for tremors, light-headedness and numbness.  Hematological: Does not bruise/bleed easily.  Psychiatric/Behavioral: Negative for agitation and behavioral problems.    Objective:  BP 112/77   Pulse 85   Temp 98.3 F (36.8 C) (Oral)   Ht 5' 2"  (1.575 m)   Wt 207 lb 6.4 oz (94.1 kg)   SpO2 94%   BMI 37.93 kg/m   BP/Weight 05/22/2018 03/22/2018 0/03/1218  Systolic BP 758 832 549  Diastolic BP 77 57 82  Wt. (Lbs) 207.4 197 198  BMI 37.93 36.03 36.21      Physical Exam  Constitutional: She is oriented to person, place, and time. She appears well-developed and well-nourished.  HENT:  Right Ear: External ear normal.  Left Ear: External ear normal.  Neck: Normal range of motion.  Cardiovascular: Normal rate, normal heart sounds and intact distal pulses.  No murmur heard. Pulmonary/Chest: Effort normal and breath sounds normal. She has no wheezes. She has no rales. She exhibits no tenderness.  Abdominal: Soft. Bowel sounds are normal. She exhibits no distension and no mass. There is no tenderness.  colostomy bag in place  Musculoskeletal: Normal range of motion. She exhibits edema (1+ b/l non pitting pedal edema).  Normal range of motion of both upper  extremities and no pain on palpation of both shoulder joints. Negative Homans sign bilaterally; no tenderness elicited in the ankle joints or knee joint  Neurological: She is alert and oriented to person, place, and time.  Skin: Skin is warm and dry.  Psychiatric: She has a normal mood and affect.     Assessment & Plan:   1. Essential hypertension Controlled Continue amlodipine due to pedal edema and commence hydrochlorothiazide Counseled on blood pressure goal of less than 130/80, low-sodium, DASH diet, medication compliance, 150 minutes of moderate intensity exercise per week. Discussed medication compliance, adverse effects. - CMP14+EGFR; Future  2. Mixed stress and urge urinary incontinence Placed on oxybutynin  3. Pedal edema Dependent edema Elevate feet, low-sodium  diet, use compression stockings Discontinue amlodipine and substitute with hydrochlorothiazide  4. Muscle cramps Could explain shoulder pains and leg cramps Advised to use muscle relaxants at night  5. Need for immunization against influenza - Flu Vaccine QUAD 36+ mos IM   Meds ordered this encounter  Medications  . hydrochlorothiazide (HYDRODIURIL) 25 MG tablet    Sig: Take 1 tablet (25 mg total) by mouth daily.    Dispense:  30 tablet    Refill:  6    Discontinue Amlodipine  . oxybutynin (DITROPAN) 5 MG tablet    Sig: Take 1 tablet (5 mg total) by mouth 2 (two) times daily.    Dispense:  60 tablet    Refill:  6  . tiZANidine (ZANAFLEX) 4 MG tablet    Sig: Take 1 tablet (4 mg total) by mouth at bedtime.    Dispense:  30 tablet    Refill:  1  . prochlorperazine (COMPAZINE) 10 MG tablet    Sig: Take 1 tablet (10 mg total) by mouth 2 (two) times daily as needed. For nausea/vomiting.    Dispense:  30 tablet    Refill:  0    Follow-up: Return in about 3 months (around 08/21/2018) for Follow-up of chronic medical conditions.   [name] MD

## 2018-05-28 ENCOUNTER — Telehealth: Payer: Self-pay | Admitting: Family Medicine

## 2018-05-28 NOTE — Telephone Encounter (Signed)
Pt called for a medication change on -oxybutynin (DITROPAN) 5 MG tablet  She states she had this medication in the past and it did not work for her. Please follow up with patient with other options Pt's pharmacy is-[facility] [phone] - [place], [address] AT [address]

## 2018-05-28 NOTE — Telephone Encounter (Signed)
Will route to PCP for review. 

## 2018-05-29 MED ORDER — MIRABEGRON ER 25 MG PO TB24: 25.0000 mg | ORAL_TABLET | Freq: Every day | ORAL | 3 refills | Status: DC

## 2018-05-29 NOTE — Telephone Encounter (Signed)
Patient was called and informed of new medication being sent to pharmacy.

## 2018-05-29 NOTE — Telephone Encounter (Signed)
I have changed her medication

## 2018-06-08 ENCOUNTER — Ambulatory Visit: Payer: Medicare Other | Attending: Family Medicine

## 2018-06-08 DIAGNOSIS — I10 Essential (primary) hypertension: Secondary | ICD-10-CM | POA: Diagnosis not present

## 2018-06-08 NOTE — Progress Notes (Signed)
Patient here for lab visit only 

## 2018-06-09 LAB — CMP14+EGFR
ALT: 24 IU/L (ref 0–32)
AST: 44 IU/L — ABNORMAL HIGH (ref 0–40)
Albumin/Globulin Ratio: 0.8 — ABNORMAL LOW (ref 1.2–2.2)
Albumin: 3.2 g/dL — ABNORMAL LOW (ref 3.5–4.8)
Alkaline Phosphatase: 116 IU/L (ref 39–117)
BUN/Creatinine Ratio: 12 (ref 12–28)
BUN: 17 mg/dL (ref 8–27)
Bilirubin Total: 0.5 mg/dL (ref 0.0–1.2)
CO2: 23 mmol/L (ref 20–29)
Calcium: 8.6 mg/dL — ABNORMAL LOW (ref 8.7–10.3)
Chloride: 103 mmol/L (ref 96–106)
Creatinine, Ser: 1.4 mg/dL — ABNORMAL HIGH (ref 0.57–1.00)
GFR calc Af Amer: 42 mL/min/{1.73_m2} — ABNORMAL LOW (ref >59)
GFR calc non Af Amer: 37 mL/min/{1.73_m2} — ABNORMAL LOW (ref >59)
Globulin, Total: 3.9 g/dL (ref 1.5–4.5)
Glucose: 129 mg/dL — ABNORMAL HIGH (ref 65–99)
Potassium: 3.3 mmol/L — ABNORMAL LOW (ref 3.5–5.2)
Sodium: 142 mmol/L (ref 134–144)
Total Protein: 7.1 g/dL (ref 6.0–8.5)

## 2018-06-11 ENCOUNTER — Telehealth: Payer: Self-pay

## 2018-06-11 ENCOUNTER — Other Ambulatory Visit: Payer: Self-pay | Admitting: Family Medicine

## 2018-06-11 MED ORDER — POTASSIUM CHLORIDE ER 10 MEQ PO TBCR: 10.0000 meq | EXTENDED_RELEASE_TABLET | Freq: Every day | ORAL | 3 refills | Status: DC

## 2018-06-11 NOTE — Telephone Encounter (Addendum)
Call made to patient to inform her of her lab results and new prescription for potassium as per Dr Smitty Pluck note below:     Notes recorded by [name], MD on 06/11/2018 at 7:36 AM EDT Potassium is low, I have sent a Rx for replacement to her pharmacy   Called back with prescription information name, dose , route, schedule, and advised pt take with food to avoid stomach upset.  She could repeat information and she said she would get the medicine today and start it.  Jackie Parker had no questions.

## 2018-07-05 DIAGNOSIS — Z1231 Encounter for screening mammogram for malignant neoplasm of breast: Secondary | ICD-10-CM | POA: Diagnosis not present

## 2018-07-21 DIAGNOSIS — Z933 Colostomy status: Secondary | ICD-10-CM | POA: Diagnosis not present

## 2018-08-13 ENCOUNTER — Encounter (HOSPITAL_COMMUNITY): Payer: Self-pay

## 2018-08-13 ENCOUNTER — Other Ambulatory Visit: Payer: Self-pay

## 2018-08-13 ENCOUNTER — Inpatient Hospital Stay (HOSPITAL_COMMUNITY)
Admission: AD | Admit: 2018-08-13 | Discharge: 2018-08-13 | Disposition: A | Discharge status: Home / Self Care | Payer: Medicare Other | Source: Ambulatory Visit | Attending: Obstetrics and Gynecology | Admitting: Obstetrics and Gynecology

## 2018-08-13 DIAGNOSIS — N950 Postmenopausal bleeding: Secondary | ICD-10-CM | POA: Diagnosis not present

## 2018-08-13 DIAGNOSIS — Z30432 Encounter for removal of intrauterine contraceptive device: Secondary | ICD-10-CM

## 2018-08-13 NOTE — MAU Provider Note (Signed)
Chief Complaint: Vaginal Bleeding   First Provider Initiated Contact with Patient 08/13/18 0840     SUBJECTIVE HPI: Jackie Parker is a 77 y.o. non pregnant female who presents to [facility] reporting vaginal bleeding.  Has had IUD in place since 2014; was placed for post menopausal bleeding. Was seen at [facility] earlier this year for IUD check. Is scheduled to see Dr. Rip Harbour later this month to have her IUD removed & was told that it wouldn't be replaced. Hasn't had any bleeding episodes since IUD was placed in 2014.  Current bleeding started last Tuesday. Bleeding has been intermittently heavy. Today feels like the bleeding has slowed down. Passed several half dollar sized clots over the weekend. Had abdominal cramping the first few days of bleeding but denies pain since then.  States she just wants the IUD out today.    Past Medical History:  Diagnosis Date  . AKI (acute kidney injury) ([place]) 12/2015  . Allergy    takes Singulair and Zyrtec daily  . Asthma    uses Adair daily  . Cataracts, bilateral   . Colitis, ulcerative ([place])   . DJD (degenerative joint disease)   . Glaucoma    uses eye drops daily  . History of blood transfusion    no abnormal reaction noted. 40+ yrs ago  . History of bronchitis    yrs ago  . History of gout    not on any meds  . Hypertension    takes Amlodipine daily  . Joint pain   . Joint swelling   . Psoriasis   . Urinary frequency    takes Ditropan daily  . Vertigo    doesn't take any meds  . Vitamin D deficiency    OB History  Gravida Para Term Preterm AB Living  5 5 5     5   SAB TAB Ectopic Multiple Live Births          5    # Outcome Date GA Lbr Len/2nd Weight Sex Delivery Anes PTL Lv  5 Term           4 Term           3 Term           2 Term           1 Term            Past Surgical History:  Procedure Laterality Date  . BACK SURGERY    . COLONOSCOPY    . ILEOSTOMY    . KNEE ARTHROPLASTY Left 12/14/2015   Procedure:  COMPUTER ASSISTED LEFT TOTAL KNEE ARTHROPLASTY;  Surgeon: Jessy Oto, MD;  Location: [facility];  Service: Orthopedics;  Laterality: Left;  . TOTAL KNEE ARTHROPLASTY Left 12/14/2015  . TOTAL KNEE ARTHROPLASTY Right 06/27/2016  . TOTAL KNEE ARTHROPLASTY Right 06/27/2016   Procedure: RIGHT TOTAL KNEE ARTHROPLASTY;  Surgeon: Jessy Oto, MD;  Location: [facility];  Service: Orthopedics;  Laterality: Right;  . TUBAL LIGATION     Social History   Socioeconomic History  . Marital status: Single    Spouse name: Not on file  . Number of children: Not on file  . Years of education: Not on file  . Highest education level: Not on file  Occupational History  . Not on file  Social Needs  . Financial resource strain: Not on file  . Food insecurity:    Worry: Not on file    Inability: Not on file  .  Transportation needs:    Medical: Not on file    Non-medical: Not on file  Tobacco Use  . Smoking status: Never Smoker  . Smokeless tobacco: Never Used  Substance and Sexual Activity  . Alcohol use: No    Alcohol/week: 0.0 standard drinks  . Drug use: No  . Sexual activity: Never    Birth control/protection: IUD  Lifestyle  . Physical activity:    Days per week: Not on file    Minutes per session: Not on file  . Stress: Not on file  Relationships  . Social connections:    Talks on phone: Not on file    Gets together: Not on file    Attends religious service: Not on file    Active member of club or organization: Not on file    Attends meetings of clubs or organizations: Not on file    Relationship status: Not on file  . Intimate partner violence:    Fear of current or ex partner: Not on file    Emotionally abused: Not on file    Physically abused: Not on file    Forced sexual activity: Not on file  Other Topics Concern  . Not on file  Social History Narrative  . Not on file   Family History  Problem Relation Age of Onset  . Hypertension Sister   . Glaucoma Sister    Current  Facility-Administered Medications on File Prior to Encounter  Medication Dose Route Frequency Provider Last Rate Last Dose  . indomethacin (INDOCIN) capsule 25 mg  25 mg Oral BID WC Jessy Oto, MD       Current Outpatient Medications on File Prior to Encounter  Medication Sig Dispense Refill  . albuterol (PROAIR HFA) 108 (90 Base) MCG/ACT inhaler INHALE 2 PUFFS INTO THE LUNGS EVERY 6 HOURS AS NEEDED FOR WHEEZING OR SHORTNESS OF BREATH 8.5 g 3  . brinzolamide (AZOPT) 1 % ophthalmic suspension Place 1 drop into the right eye 2 (two) times daily.    . cholecalciferol (VITAMIN D) 1000 units tablet Take 1,000 Units by mouth daily.    . fluticasone (FLOVENT HFA) 110 MCG/ACT inhaler Inhale 2 puffs into the lungs 2 (two) times daily. 1 Inhaler 6  . hydrochlorothiazide (HYDRODIURIL) 25 MG tablet Take 1 tablet (25 mg total) by mouth daily. 30 tablet 6  . LUMIGAN 0.01 % SOLN Place 1 drop into both eyes at bedtime.    . mirabegron ER (MYRBETRIQ) 25 MG TB24 tablet Take 1 tablet (25 mg total) by mouth daily. 30 tablet 3  . potassium chloride (KLOR-CON 10) 10 MEQ tablet Take 1 tablet (10 mEq total) by mouth daily. 30 tablet 3  . prochlorperazine (COMPAZINE) 10 MG tablet Take 1 tablet (10 mg total) by mouth 2 (two) times daily as needed. For nausea/vomiting. 30 tablet 0  . Pumpkin Seed-Soy Germ (AZO BLADDER CONTROL/GO-LESS PO) Take 1-2 tablets by mouth daily as needed (bladder control).     Marland Kitchen tiZANidine (ZANAFLEX) 4 MG tablet Take 1 tablet (4 mg total) by mouth at bedtime. 30 tablet 1   Allergies  Allergen Reactions  . Sulfonamide Derivatives Rash    I have reviewed patient's Past Medical Hx, Surgical Hx, Family Hx, Social Hx, medications and allergies.   Review of Systems  Constitutional: Negative.   Gastrointestinal: Negative.   Genitourinary: Positive for vaginal bleeding.    OBJECTIVE Patient Vitals for the past 24 hrs:  BP Temp Temp src Pulse Resp Weight  08/13/18 0837 139/73  99.1 F (37.3  C) Oral 94 18 -  08/13/18 0826 - - - - - 95 kg   Constitutional: Well-developed, well-nourished female in no acute distress.  Cardiovascular: normal rate & rhythm, no murmur Respiratory: normal rate and effort. Lung sounds clear throughout GI: Abd soft, non-tender, Pos BS x 4. No guarding or rebound tenderness. Colostomy in place MS: Extremities nontender, no edema, normal ROM Neurologic: Alert and oriented x 4.  GU:     SPECULUM EXAM: NEFG, small amount of dark red blood cleared away with fox swab; no further active bleeding.   BIMANUAL: No CMT. Uterus enlarged.    LAB RESULTS No results found for this or any previous visit (from the past 24 hour(s)).  IMAGING No results found.  MAU COURSE Orders Placed This Encounter  Procedures  . Discharge patient   No orders of the defined types were placed in this encounter.   MDM VSS, NAD Minimal bleeding on exam today. IUD removed intact per patient's request Pt has f/u with Dr. Rip Harbour in 2 weeks. Stressed importance of keeping this appointment to evaluate for PMB. Discussed reasons to return to MAU or ED  ASSESSMENT 1. Postmenopausal bleeding   2. Encounter for IUD removal     PLAN Discharge home in stable condition. Bleeding precautions  Allergies as of 08/13/2018      Reactions   Sulfonamide Derivatives Rash      Medication List    STOP taking these medications   colchicine 0.6 MG tablet   traMADol 50 MG tablet Commonly known as:  ULTRAM     TAKE these medications   albuterol 108 (90 Base) MCG/ACT inhaler Commonly known as:  PROVENTIL HFA;VENTOLIN HFA INHALE 2 PUFFS INTO THE LUNGS EVERY 6 HOURS AS NEEDED FOR WHEEZING OR SHORTNESS OF BREATH   AZO BLADDER CONTROL/GO-LESS PO Take 1-2 tablets by mouth daily as needed (bladder control).   brinzolamide 1 % ophthalmic suspension Commonly known as:  AZOPT Place 1 drop into the right eye 2 (two) times daily.   cholecalciferol 1000 units tablet Commonly known as:   VITAMIN D Take 1,000 Units by mouth daily.   fluticasone 110 MCG/ACT inhaler Commonly known as:  FLOVENT HFA Inhale 2 puffs into the lungs 2 (two) times daily.   hydrochlorothiazide 25 MG tablet Commonly known as:  HYDRODIURIL Take 1 tablet (25 mg total) by mouth daily.   LUMIGAN 0.01 % Soln Generic drug:  bimatoprost Place 1 drop into both eyes at bedtime.   mirabegron ER 25 MG Tb24 tablet Commonly known as:  MYRBETRIQ Take 1 tablet (25 mg total) by mouth daily.   potassium chloride 10 MEQ tablet Commonly known as:  K-DUR Take 1 tablet (10 mEq total) by mouth daily.   prochlorperazine 10 MG tablet Commonly known as:  COMPAZINE Take 1 tablet (10 mg total) by mouth 2 (two) times daily as needed. For nausea/vomiting.   tiZANidine 4 MG tablet Commonly known as:  ZANAFLEX Take 1 tablet (4 mg total) by mouth at bedtime.        Jorje Guild, NP 08/13/2018  3:20 PM

## 2018-08-13 NOTE — MAU Note (Signed)
Urine in lab 

## 2018-08-13 NOTE — Discharge Instructions (Signed)
Postmenopausal Bleeding Postmenopausal bleeding is any bleeding a woman has after she has entered into menopause. Menopause is the end of a womans fertile years. After menopause, a woman no longer ovulates or has menstrual periods. Postmenopausal bleeding can be caused by various things. Any type of postmenopausal bleeding, even if it appears to be a typical menstrual period, is concerning. This should be evaluated by your health care provider. Any treatment will depend on the cause of the bleeding. Follow these instructions at home: Monitor your condition for any changes. The following actions may help to alleviate any discomfort you are experiencing:  Avoid the use of tampons and douches as directed by your health care provider.  Change your pads frequently.  Get regular pelvic exams and Pap tests.  Keep all follow-up appointments for diagnostic tests as directed by your health care provider.  Contact a health care provider if:  Your bleeding lasts more than 1 week.  You have abdominal pain.  You have bleeding with sexual intercourse. Get help right away if:  You have a fever, chills, headache, dizziness, muscle aches, and bleeding.  You have severe pain with bleeding.  You are passing blood clots.  You have bleeding and need more than 1 pad an hour.  You feel faint. This information is not intended to replace advice given to you by your health care provider. Make sure you discuss any questions you have with your health care provider. Document Released: 12/07/2005 Document Revised: 02/04/2016 Document Reviewed: 03/28/2013 Elsevier Interactive Patient Education  [employer].

## 2018-08-13 NOTE — MAU Note (Signed)
Started bleeding on Wed.  Has been heavy at times.  No pain at this time

## 2018-08-21 ENCOUNTER — Ambulatory Visit: Payer: Medicare Other | Attending: Family Medicine | Admitting: Family Medicine

## 2018-08-21 ENCOUNTER — Encounter: Payer: Self-pay | Admitting: Family Medicine

## 2018-08-21 VITALS — BP 125/77 | HR 83 | Temp 98.1°F | Ht 62.0 in | Wt 208.0 lb

## 2018-08-21 DIAGNOSIS — R0982 Postnasal drip: Secondary | ICD-10-CM

## 2018-08-21 DIAGNOSIS — J4541 Moderate persistent asthma with (acute) exacerbation: Secondary | ICD-10-CM | POA: Diagnosis not present

## 2018-08-21 DIAGNOSIS — Z79899 Other long term (current) drug therapy: Secondary | ICD-10-CM | POA: Insufficient documentation

## 2018-08-21 DIAGNOSIS — I10 Essential (primary) hypertension: Secondary | ICD-10-CM

## 2018-08-21 DIAGNOSIS — K5190 Ulcerative colitis, unspecified, without complications: Secondary | ICD-10-CM | POA: Diagnosis not present

## 2018-08-21 DIAGNOSIS — Z9049 Acquired absence of other specified parts of digestive tract: Secondary | ICD-10-CM

## 2018-08-21 DIAGNOSIS — H409 Unspecified glaucoma: Secondary | ICD-10-CM | POA: Insufficient documentation

## 2018-08-21 DIAGNOSIS — Z96653 Presence of artificial knee joint, bilateral: Secondary | ICD-10-CM | POA: Insufficient documentation

## 2018-08-21 DIAGNOSIS — M170 Bilateral primary osteoarthritis of knee: Secondary | ICD-10-CM | POA: Insufficient documentation

## 2018-08-21 DIAGNOSIS — E876 Hypokalemia: Secondary | ICD-10-CM

## 2018-08-21 DIAGNOSIS — R490 Dysphonia: Secondary | ICD-10-CM | POA: Insufficient documentation

## 2018-08-21 DIAGNOSIS — N3946 Mixed incontinence: Secondary | ICD-10-CM

## 2018-08-21 DIAGNOSIS — Z882 Allergy status to sulfonamides status: Secondary | ICD-10-CM | POA: Insufficient documentation

## 2018-08-21 MED ORDER — POTASSIUM CHLORIDE ER 10 MEQ PO TBCR: 10.0000 meq | EXTENDED_RELEASE_TABLET | Freq: Every day | ORAL | 6 refills | Status: DC

## 2018-08-21 MED ORDER — HYDROCHLOROTHIAZIDE 25 MG PO TABS: 25.0000 mg | ORAL_TABLET | Freq: Every day | ORAL | 6 refills | Status: DC

## 2018-08-21 MED ORDER — ALBUTEROL SULFATE HFA 108 (90 BASE) MCG/ACT IN AERS: INHALATION_SPRAY | RESPIRATORY_TRACT | 6 refills | Status: DC

## 2018-08-21 MED ORDER — MIRABEGRON ER 25 MG PO TB24: 25.0000 mg | ORAL_TABLET | Freq: Every day | ORAL | 6 refills | Status: DC

## 2018-08-21 MED ORDER — FLUTICASONE PROPIONATE HFA 110 MCG/ACT IN AERO: 2.0000 | INHALATION_SPRAY | Freq: Two times a day (BID) | RESPIRATORY_TRACT | 6 refills | Status: DC

## 2018-08-21 MED ORDER — CETIRIZINE HCL 10 MG PO TABS
10.0000 mg | ORAL_TABLET | Freq: Every day | ORAL | 1 refills | Status: DC
Start: 2018-08-21 — End: 2018-11-01

## 2018-08-21 NOTE — Progress Notes (Signed)
Subjective:  Patient ID: Jackie Parker, female    DOB: [birth year]  Age: 77 y.o. 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 I had prescribed tizanidine at her last visit for muscle cramps which she was unable to tolerate due to hallucinations.  For now she reports doing well and would like to hold off with regards to medications for muscle spasm. She was recently seen by GYN at the [facility] hospital last week due to postmenopausal bleed and had her IUD removed. Today she complains of " like something is blocking her vocal cords" symptoms are worse at night and associated with having to clear her throat frequently.  This is associated with voice hoarseness but no pain, no weight loss and she endorses postnasal drip. She is compliant with her antihypertensive and tolerates medications.  Denies recent ulcerative colitis flare.  Glaucoma and asthma stable.  Past Medical History:  Diagnosis Date  . AKI (acute kidney injury) ([place]) 12/2015  . Allergy    takes Singulair and Zyrtec daily  . Asthma    uses Adair daily  . Cataracts, bilateral   . Colitis, ulcerative ([place])   . DJD (degenerative joint disease)   . Glaucoma    uses eye drops daily  . History of blood transfusion    no abnormal reaction noted. 40+ yrs ago  . History of bronchitis    yrs ago  . History of gout    not on any meds  . Hypertension    takes Amlodipine daily  . Joint pain   . Joint swelling   . Psoriasis   . Urinary frequency    takes Ditropan daily  . Vertigo    doesn't take any meds  . Vitamin D deficiency     Past Surgical History:  Procedure Laterality Date  . BACK SURGERY    . COLONOSCOPY    . ILEOSTOMY    . KNEE  ARTHROPLASTY Left 12/14/2015   Procedure: COMPUTER ASSISTED LEFT TOTAL KNEE ARTHROPLASTY;  Surgeon: Jessy Oto, MD;  Location: [facility];  Service: Orthopedics;  Laterality: Left;  . TOTAL KNEE ARTHROPLASTY Left 12/14/2015  . TOTAL KNEE ARTHROPLASTY Right 06/27/2016  . TOTAL KNEE ARTHROPLASTY Right 06/27/2016   Procedure: RIGHT TOTAL KNEE ARTHROPLASTY;  Surgeon: Jessy Oto, MD;  Location: [facility];  Service: Orthopedics;  Laterality: Right;  . TUBAL LIGATION      Allergies  Allergen Reactions  . Sulfonamide Derivatives Rash     Outpatient Medications Prior to Visit  Medication Sig Dispense Refill  . brinzolamide (AZOPT) 1 % ophthalmic suspension Place 1 drop into the right eye 2 (two) times daily.    . cholecalciferol (VITAMIN D) 1000 units tablet Take 1,000 Units by mouth daily.    Marland Kitchen LUMIGAN 0.01 % SOLN Place 1 drop into both eyes at bedtime.    . prochlorperazine (COMPAZINE) 10 MG tablet Take 1 tablet (10 mg total) by mouth 2 (two) times daily as needed. For nausea/vomiting. 30 tablet 0  . albuterol (PROAIR HFA) 108 (90 Base) MCG/ACT inhaler INHALE 2 PUFFS INTO THE LUNGS EVERY 6 HOURS AS NEEDED FOR WHEEZING OR SHORTNESS OF BREATH 8.5 g 3  . fluticasone (FLOVENT HFA) 110 MCG/ACT inhaler Inhale 2 puffs into the lungs 2 (two)  times daily. 1 Inhaler 6  . hydrochlorothiazide (HYDRODIURIL) 25 MG tablet Take 1 tablet (25 mg total) by mouth daily. 30 tablet 6  . mirabegron ER (MYRBETRIQ) 25 MG TB24 tablet Take 1 tablet (25 mg total) by mouth daily. 30 tablet 3  . potassium chloride (KLOR-CON 10) 10 MEQ tablet Take 1 tablet (10 mEq total) by mouth daily. 30 tablet 3  . Pumpkin Seed-Soy Germ (AZO BLADDER CONTROL/GO-LESS PO) Take 1-2 tablets by mouth daily as needed (bladder control).     Marland Kitchen tiZANidine (ZANAFLEX) 4 MG tablet Take 1 tablet (4 mg total) by mouth at bedtime. (Patient not taking: Reported on 08/21/2018) 30 tablet 1   Facility-Administered Medications Prior to Visit  Medication Dose  Route Frequency Provider Last Rate Last Dose  . indomethacin (INDOCIN) capsule 25 mg  25 mg Oral BID WC Jessy Oto, MD        ROS Review of Systems  Constitutional: Negative for activity change, appetite change and fatigue.  HENT: Negative for congestion, sinus pressure and sore throat.   Eyes: Negative for visual disturbance.  Respiratory: Negative for cough, chest tightness, shortness of breath and wheezing.   Cardiovascular: Negative for chest pain and palpitations.  Gastrointestinal: Negative for abdominal distention, abdominal pain and constipation.  Endocrine: Negative for polydipsia.  Genitourinary: Negative for dysuria and frequency.  Musculoskeletal: Negative for arthralgias and back pain.  Skin: Negative for rash.  Neurological: Negative for tremors, light-headedness and numbness.  Hematological: Does not bruise/bleed easily.  Psychiatric/Behavioral: Negative for agitation and behavioral problems.    Objective:  BP 125/77   Pulse 83   Temp 98.1 F (36.7 C) (Oral)   Ht 5' 2"  (1.575 m)   Wt 208 lb (94.3 kg)   SpO2 96%   BMI 38.04 kg/m   BP/Weight 08/21/2018 08/13/2018 9/56/2130  Systolic BP 865 784 696  Diastolic BP 77 73 77  Wt. (Lbs) 208 209.5 207.4  BMI 38.04 38.32 37.93      Physical Exam  Constitutional: She is oriented to person, place, and time. She appears well-developed and well-nourished.  Cardiovascular: Normal rate, normal heart sounds and intact distal pulses.  No murmur heard. Pulmonary/Chest: Effort normal and breath sounds normal. She has no wheezes. She has no rales. She exhibits no tenderness.  Abdominal: Soft. Bowel sounds are normal. She exhibits no distension and no mass. There is no tenderness.  Colostomy bag in place  Musculoskeletal: Normal range of motion.  Neurological: She is alert and oriented to person, place, and time.     Assessment & Plan:   1. Moderate persistent asthma with exacerbation Controlled - albuterol  (PROAIR HFA) 108 (90 Base) MCG/ACT inhaler; INHALE 2 PUFFS INTO THE LUNGS EVERY 6 HOURS AS NEEDED FOR WHEEZING OR SHORTNESS OF BREATH  Dispense: 8.5 g; Refill: 6 - fluticasone (FLOVENT HFA) 110 MCG/ACT inhaler; Inhale 2 puffs into the lungs 2 (two) times daily.  Dispense: 1 Inhaler; Refill: 6  2. Essential hypertension Controlled Low-sodium diet - Basic Metabolic Panel - hydrochlorothiazide (HYDRODIURIL) 25 MG tablet; Take 1 tablet (25 mg total) by mouth daily.  Dispense: 30 tablet; Refill: 6  3. Ulcerative colitis without complications, unspecified location ([place]) No recent complications Status post colectomy  4. S/p total colectomy in 1977 See #3 above  5. Post-nasal drip Could explain throat symptoms If symptoms persist despite use of antihistamine I will refer to ENT for laryngoscopy - cetirizine (ZYRTEC) 10 MG tablet; Take 1 tablet (10 mg total) by mouth daily.  Dispense: 30 tablet; Refill: 1  6. Hypokalemia Secondary to HCTZ We will check potassium today as last potassium was 3.3 - potassium chloride (KLOR-CON 10) 10 MEQ tablet; Take 1 tablet (10 mEq total) by mouth daily.  Dispense: 30 tablet; Refill: 6  7. Mixed stress and urge urinary incontinence Controlled - mirabegron ER (MYRBETRIQ) 25 MG TB24 tablet; Take 1 tablet (25 mg total) by mouth daily.  Dispense: 30 tablet; Refill: 6   Meds ordered this encounter  Medications  . cetirizine (ZYRTEC) 10 MG tablet    Sig: Take 1 tablet (10 mg total) by mouth daily.    Dispense:  30 tablet    Refill:  1  . albuterol (PROAIR HFA) 108 (90 Base) MCG/ACT inhaler    Sig: INHALE 2 PUFFS INTO THE LUNGS EVERY 6 HOURS AS NEEDED FOR WHEEZING OR SHORTNESS OF BREATH    Dispense:  8.5 g    Refill:  6  . fluticasone (FLOVENT HFA) 110 MCG/ACT inhaler    Sig: Inhale 2 puffs into the lungs 2 (two) times daily.    Dispense:  1 Inhaler    Refill:  6  . hydrochlorothiazide (HYDRODIURIL) 25 MG tablet    Sig: Take 1 tablet (25 mg total) by  mouth daily.    Dispense:  30 tablet    Refill:  6  . mirabegron ER (MYRBETRIQ) 25 MG TB24 tablet    Sig: Take 1 tablet (25 mg total) by mouth daily.    Dispense:  30 tablet    Refill:  6  . potassium chloride (KLOR-CON 10) 10 MEQ tablet    Sig: Take 1 tablet (10 mEq total) by mouth daily.    Dispense:  30 tablet    Refill:  6    Follow-up: Return in about 6 months (around 02/20/2019) for follow up of chronic medical conditions.   [name] MD

## 2018-08-22 ENCOUNTER — Other Ambulatory Visit: Payer: Self-pay | Admitting: Family Medicine

## 2018-08-22 DIAGNOSIS — E876 Hypokalemia: Secondary | ICD-10-CM

## 2018-08-22 LAB — BASIC METABOLIC PANEL
BUN/Creatinine Ratio: 11 — ABNORMAL LOW (ref 12–28)
BUN: 17 mg/dL (ref 8–27)
CO2: 22 mmol/L (ref 20–29)
Calcium: 8.6 mg/dL — ABNORMAL LOW (ref 8.7–10.3)
Chloride: 104 mmol/L (ref 96–106)
Creatinine, Ser: 1.58 mg/dL — ABNORMAL HIGH (ref 0.57–1.00)
GFR calc Af Amer: 36 mL/min/{1.73_m2} — ABNORMAL LOW (ref >59)
GFR calc non Af Amer: 31 mL/min/{1.73_m2} — ABNORMAL LOW (ref >59)
Glucose: 75 mg/dL (ref 65–99)
Potassium: 3.1 mmol/L — ABNORMAL LOW (ref 3.5–5.2)
Sodium: 140 mmol/L (ref 134–144)

## 2018-08-22 MED ORDER — POTASSIUM CHLORIDE ER 20 MEQ PO TBCR: 20.0000 meq | EXTENDED_RELEASE_TABLET | Freq: Every day | ORAL | 6 refills | Status: DC

## 2018-08-24 ENCOUNTER — Telehealth: Payer: Self-pay

## 2018-08-24 NOTE — Telephone Encounter (Signed)
-----   Message from [name], MD sent at 08/22/2018  1:42 PM EST ----- Potassium is decreased at 3.1.  Please advise to increase potassium to 20 mEq daily

## 2018-08-24 NOTE — Telephone Encounter (Signed)
Patient was called and informed of lab results and medication change.

## 2018-08-27 DIAGNOSIS — H401412 Capsular glaucoma with pseudoexfoliation of lens, right eye, moderate stage: Secondary | ICD-10-CM | POA: Diagnosis not present

## 2018-08-27 DIAGNOSIS — H401122 Primary open-angle glaucoma, left eye, moderate stage: Secondary | ICD-10-CM | POA: Diagnosis not present

## 2018-08-29 ENCOUNTER — Encounter: Payer: Self-pay | Admitting: Obstetrics and Gynecology

## 2018-08-29 ENCOUNTER — Ambulatory Visit (INDEPENDENT_AMBULATORY_CARE_PROVIDER_SITE_OTHER): Payer: Medicare Other | Admitting: Obstetrics and Gynecology

## 2018-08-29 VITALS — BP 136/77 | HR 76 | Wt 209.8 lb

## 2018-08-29 DIAGNOSIS — N950 Postmenopausal bleeding: Secondary | ICD-10-CM

## 2018-08-29 NOTE — Progress Notes (Signed)
Patient is in the office for follow up after IUD removal at the hospital 08/13/18.

## 2018-08-29 NOTE — Progress Notes (Signed)
Patient ID: Jackie Parker, female   DOB: May 16, 1941, 77 y.o.   MRN: [id number] Appt originally scheduled for IUD removal. Pt however had IUD removed in the hospital. She has no complaints today. No bleeding since removal.  Pt initially had IUD placed d/t PMB. W/U at that time was negative.  She desires not to have a new IUD placed presently.  PE AF VSS Lungs clear Heart RRR  A/P H/O PMB  Pt instructed to monitor for PMB. To call office if she has any PMB.

## 2018-10-22 ENCOUNTER — Telehealth: Payer: Self-pay | Admitting: Family Medicine

## 2018-10-22 ENCOUNTER — Other Ambulatory Visit: Payer: Self-pay | Admitting: Family Medicine

## 2018-10-22 MED ORDER — ALLOPURINOL 100 MG PO TABS: 100.0000 mg | ORAL_TABLET | Freq: Every day | ORAL | 1 refills | Status: DC

## 2018-10-22 MED ORDER — COLCHICINE 0.6 MG PO TABS: ORAL_TABLET | ORAL | 1 refills | Status: DC

## 2018-10-22 NOTE — Telephone Encounter (Signed)
Will route to PCP for review. 

## 2018-10-22 NOTE — Telephone Encounter (Signed)
Patient called to see if she could get a prescription for gout in her left toe and for allopourinl. Please follow up.

## 2018-10-22 NOTE — Telephone Encounter (Signed)
I have sent a prescription for colchicine which should be used for acute gout flare and allopurinol which should be used for gout prevention to her pharmacy.  If she is beginning to have recurrent gout flares she will need an office visit to discuss substituting hydrochlorothiazide which could be contributing to her gout flare.

## 2018-10-23 NOTE — Telephone Encounter (Signed)
Patient was called and informed of medication being sent to pharmacy.

## 2018-10-30 ENCOUNTER — Other Ambulatory Visit: Payer: Self-pay | Admitting: Family Medicine

## 2018-10-30 DIAGNOSIS — R0982 Postnasal drip: Secondary | ICD-10-CM

## 2018-12-10 ENCOUNTER — Telehealth: Payer: Self-pay | Admitting: Family Medicine

## 2018-12-10 NOTE — Telephone Encounter (Signed)
Unfortunately most insurance companies don't cover OTC allergy meds. She can purchase cetirizine or any comparable product OTC, [facility] tends to have good prices on generic meds like that.

## 2018-12-10 NOTE — Telephone Encounter (Signed)
1) Medication(s) Requested (by name): cetirizine (ZYRTEC) 10 MG tablet   2) Pharmacy of Choice: [facility], [address] AT [address] 3) Special Requests: Pt would like to be prescribed something that her inusurance covers in place of cetirizine (ZYRTEC) 10 MG tablet   please follow up   Approved medications will be sent to the pharmacy, we will reach out if there is an issue.  Requests made after 3pm may not be addressed until the following business day!  If a patient is unsure of the name of the medication(s) please note and ask patient to call back when they are able to provide all info, do not send to responsible party until all information is available!

## 2019-02-13 DIAGNOSIS — Z933 Colostomy status: Secondary | ICD-10-CM | POA: Diagnosis not present

## 2019-03-07 DIAGNOSIS — H401122 Primary open-angle glaucoma, left eye, moderate stage: Secondary | ICD-10-CM | POA: Diagnosis not present

## 2019-03-07 DIAGNOSIS — H401412 Capsular glaucoma with pseudoexfoliation of lens, right eye, moderate stage: Secondary | ICD-10-CM | POA: Diagnosis not present

## 2019-03-31 ENCOUNTER — Other Ambulatory Visit: Payer: Self-pay | Admitting: Family Medicine

## 2019-04-03 DIAGNOSIS — H25092 Other age-related incipient cataract, left eye: Secondary | ICD-10-CM | POA: Diagnosis not present

## 2019-04-03 DIAGNOSIS — H25811 Combined forms of age-related cataract, right eye: Secondary | ICD-10-CM | POA: Diagnosis not present

## 2019-04-04 ENCOUNTER — Encounter: Payer: Self-pay | Admitting: Obstetrics and Gynecology

## 2019-04-04 ENCOUNTER — Other Ambulatory Visit: Payer: Self-pay

## 2019-04-04 ENCOUNTER — Ambulatory Visit (INDEPENDENT_AMBULATORY_CARE_PROVIDER_SITE_OTHER): Payer: Medicare Other | Admitting: Obstetrics and Gynecology

## 2019-04-04 VITALS — BP 134/79 | HR 50 | Wt 216.2 lb

## 2019-04-04 DIAGNOSIS — N950 Postmenopausal bleeding: Secondary | ICD-10-CM | POA: Diagnosis not present

## 2019-04-04 MED ORDER — MEGESTROL ACETATE 40 MG PO TABS: 40.0000 mg | ORAL_TABLET | Freq: Two times a day (BID) | ORAL | 5 refills | Status: DC

## 2019-04-04 NOTE — Progress Notes (Signed)
Ms Millspaugh is seen today for PMB. Pt with H/O PBM in the past @ 2014 W/U was negative. Had IUD placed. IUD removed 08/2018. No bleeding until this past weekend. Has had some degree of bleeding since.  PE AF VSS Lungs clear Heart RRR Abd soft + BS  A/P PMB  Will check GYN U/S. Megace 40 mg bid x 7 days and then daily. F/U in 4 weeks

## 2019-04-04 NOTE — Progress Notes (Signed)
Pt had IUD removed in 08/2018. Pt reports she has been spotting since Saturday. Pt reports she did have some mild cramping as well.

## 2019-04-17 ENCOUNTER — Telehealth: Payer: Self-pay

## 2019-04-17 ENCOUNTER — Ambulatory Visit (HOSPITAL_COMMUNITY)
Admission: RE | Admit: 2019-04-17 | Discharge: 2019-04-17 | Disposition: A | Discharge status: Home / Self Care | Payer: Medicare Other | Source: Ambulatory Visit | Attending: Obstetrics and Gynecology | Admitting: Obstetrics and Gynecology

## 2019-04-17 ENCOUNTER — Other Ambulatory Visit: Payer: Self-pay

## 2019-04-17 DIAGNOSIS — N950 Postmenopausal bleeding: Secondary | ICD-10-CM | POA: Insufficient documentation

## 2019-04-17 DIAGNOSIS — R9389 Abnormal findings on diagnostic imaging of other specified body structures: Secondary | ICD-10-CM | POA: Diagnosis not present

## 2019-04-17 NOTE — Telephone Encounter (Signed)
[facility] radiology called to confirm if we received [place] report. US transvaginal complete results are in epic for review.

## 2019-04-17 NOTE — Telephone Encounter (Signed)
Pt needs EMBX d/t thicken endometrium noted on GYN U/S She does not have an appt with me until September She can wait until then if she desires or if not please schedule with any faculty partner. Thanks Legrand Como

## 2019-04-18 NOTE — Telephone Encounter (Signed)
Pt informed of results, and that endobx needed is needed. Pt states that she will think about getting this procedure done, and call us back with her decision.

## 2019-04-23 ENCOUNTER — Encounter: Payer: Self-pay | Admitting: Family Medicine

## 2019-04-23 ENCOUNTER — Ambulatory Visit: Payer: Medicare Other | Attending: Family Medicine | Admitting: Family Medicine

## 2019-04-23 DIAGNOSIS — N3946 Mixed incontinence: Secondary | ICD-10-CM

## 2019-04-23 DIAGNOSIS — I10 Essential (primary) hypertension: Secondary | ICD-10-CM

## 2019-04-23 DIAGNOSIS — E876 Hypokalemia: Secondary | ICD-10-CM

## 2019-04-23 MED ORDER — HYDROCHLOROTHIAZIDE 25 MG PO TABS: 25.0000 mg | ORAL_TABLET | Freq: Every day | ORAL | 6 refills | Status: DC

## 2019-04-23 MED ORDER — MIRABEGRON ER 25 MG PO TB24: 25.0000 mg | ORAL_TABLET | Freq: Every day | ORAL | 6 refills | Status: DC

## 2019-04-23 MED ORDER — POTASSIUM CHLORIDE ER 10 MEQ PO TBCR: 20.0000 meq | EXTENDED_RELEASE_TABLET | Freq: Every day | ORAL | 6 refills | Status: DC

## 2019-04-23 NOTE — Progress Notes (Signed)
Virtual Visit via Telephone Note  I connected with Jackie Parker, on 04/23/2019 at 1:35 PM by telephone due to the COVID-19 pandemic and verified that I am speaking with the correct person using two identifiers.   Consent: I discussed the limitations, risks, security and privacy concerns of performing an evaluation and management service by telephone and the availability of in person appointments. I also discussed with the patient that there may be a patient responsible charge related to this service. The patient expressed understanding and agreed to proceed.   Location of Patient: Home  Location of Provider: Clinic   Persons participating in Telemedicine visit: Ceniya Fowers [place]-CMA Dr. 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  She has chronic hypokalemia and has been on potassium 20 mEq tablets but complains this is too big and would rather have a smaller pill which she took previously (of note she was previously on 10 mEq which was increased to 20 mEq tablet due to a potassium of 3.1). She is compliant with her antihypertensive and checks her blood pressures but is unable to recall any of her readings today. Her Gout has been stable and she has not needed to take her medications lately. She does have intermittent low back pain and is wondering if Voltaren gel is safe to use. She has no ulcerative colitis flares and her urinary incontinence has been stable.  Past Medical History:  Diagnosis Date  . AKI (acute kidney injury) ([place]) 12/2015  . Allergy    takes Singulair and Zyrtec daily  . Asthma    uses Adair daily  . Cataracts, bilateral   . Colitis, ulcerative ([place])    . DJD (degenerative joint disease)   . Glaucoma    uses eye drops daily  . History of blood transfusion    no abnormal reaction noted. 40+ yrs ago  . History of bronchitis    yrs ago  . History of gout    not on any meds  . Hypertension    takes Amlodipine daily  . Joint pain   . Joint swelling   . Psoriasis   . Urinary frequency    takes Ditropan daily  . Vertigo    doesn't take any meds  . Vitamin D deficiency    Allergies  Allergen Reactions  . Sulfonamide Derivatives Rash    Current Outpatient Medications on File Prior to Visit  Medication Sig Dispense Refill  . albuterol (PROAIR HFA) 108 (90 Base) MCG/ACT inhaler INHALE 2 PUFFS INTO THE LUNGS EVERY 6 HOURS AS NEEDED FOR WHEEZING OR SHORTNESS OF BREATH 8.5 g 6  . allopurinol (ZYLOPRIM) 100 MG tablet TAKE 1 TABLET(100 MG) BY MOUTH DAILY 30 tablet 1  . brinzolamide (AZOPT) 1 % ophthalmic suspension Place 1 drop into the right eye 2 (two) times daily.    . cetirizine (ZYRTEC) 10 MG tablet TAKE 1 TABLET(10 MG) BY MOUTH DAILY 30 tablet 2  . cholecalciferol (VITAMIN D) 1000 units tablet Take 1,000 Units by mouth daily.    . colchicine 0.6 MG tablet Take 2 tabs (1.2 mg) at the onset of a gout attack, may repeat 1 tab (0.6 mg) 2 hours later if symptoms persist. 30 tablet 1  . fluticasone (FLOVENT HFA) 110 MCG/ACT inhaler  Inhale 2 puffs into the lungs 2 (two) times daily. 1 Inhaler 6  . hydrochlorothiazide (HYDRODIURIL) 25 MG tablet Take 1 tablet (25 mg total) by mouth daily. 30 tablet 6  . LUMIGAN 0.01 % SOLN Place 1 drop into both eyes at bedtime.    . megestrol (MEGACE) 40 MG tablet Take 1 tablet (40 mg total) by mouth 2 (two) times daily. Take 1 tablet twice daily x 7 days and then 1 tablet daily 60 tablet 5  . mirabegron ER (MYRBETRIQ) 25 MG TB24 tablet Take 1 tablet (25 mg total) by mouth daily. 30 tablet 6  . prochlorperazine (COMPAZINE) 10 MG tablet TAKE 1 TABLET(10 MG) BY MOUTH TWICE DAILY AS NEEDED FOR NAUSEA OR VOMITING  30 tablet 0  . Pumpkin Seed-Soy Germ (AZO BLADDER CONTROL/GO-LESS PO) Take 1-2 tablets by mouth daily as needed (bladder control).     . Potassium Chloride ER 20 MEQ TBCR Take 20 mEq by mouth daily. (Patient not taking: Reported on 04/04/2019) 30 tablet 6   Current Facility-Administered Medications on File Prior to Visit  Medication Dose Route Frequency Provider Last Rate Last Dose  . indomethacin (INDOCIN) capsule 25 mg  25 mg Oral BID WC Jessy Oto, MD        Observations/Objective: Awake, alert, oriented x3 Not in acute distress  CMP Latest Ref Rng & Units 08/21/2018 06/08/2018 03/22/2018  Glucose 65 - 99 mg/dL 75 129(H) 94  BUN 8 - 27 mg/dL 17 17 15   Creatinine 0.57 - 1.00 mg/dL 1.58(H) 1.40(H) 1.42(H)  Sodium 134 - 144 mmol/L 140 142 140  Potassium 3.5 - 5.2 mmol/L 3.1(L) 3.3(L) 4.0  Chloride 96 - 106 mmol/L 104 103 109  CO2 20 - 29 mmol/L 22 23 23   Calcium 8.7 - 10.3 mg/dL 8.6(L) 8.6(L) 8.6(L)  Total Protein 6.0 - 8.5 g/dL - 7.1 -  Total Bilirubin 0.0 - 1.2 mg/dL - 0.5 -  Alkaline Phos 39 - 117 IU/L - 116 -  AST 0 - 40 IU/L - 44(H) -  ALT 0 - 32 IU/L - 24 -    Lipid Panel     Component Value Date/Time   CHOL 167 10/16/2017 0844   TRIG 74 10/16/2017 0844   HDL 77 10/16/2017 0844   CHOLHDL 2.2 10/16/2017 0844   CHOLHDL 2.6 11/09/2016 1054   VLDL 15 11/09/2016 1054   LDLCALC 75 10/16/2017 0844      Assessment and Plan: 1. Hypokalemia Uncontrolled with last potassium of 3.1 We will change from the larger 20 mEq tablets to two 10 mEq tablet - potassium chloride (K-DUR) 10 MEQ tablet; Take 2 tablets (20 mEq total) by mouth daily.  Dispense: 60 tablet; Refill: 6 - CMP14+EGFR; Future  2. Essential hypertension Stable Counseled on blood pressure goal of less than 130/80, low-sodium, DASH diet, medication compliance, 150 minutes of moderate intensity exercise per week. Discussed medication compliance, adverse effects. - hydrochlorothiazide (HYDRODIURIL) 25 MG  tablet; Take 1 tablet (25 mg total) by mouth daily.  Dispense: 30 tablet; Refill: 6 - Lipid panel; Future  3. Mixed stress and urge urinary incontinence Stable - mirabegron ER (MYRBETRIQ) 25 MG TB24 tablet; Take 1 tablet (25 mg total) by mouth daily.  Dispense: 30 tablet; Refill: 6   Follow Up Instructions: 6 months   I discussed the assessment and treatment plan with the patient. The patient was provided an opportunity to ask questions and all were answered. The patient agreed with the plan and demonstrated an understanding of the  instructions.   The patient was advised to call back or seek an in-person evaluation if the symptoms worsen or if the condition fails to improve as anticipated.     I provided 15 minutes total of non-face-to-face time during this encounter including median intraservice time, reviewing previous notes, labs, imaging, medications, management and patient verbalized understanding.     [name], MD, FAAFP. [facility] and [place] [facility]en[facility] Saenz, [place]   04/23/2019, 1:35 PM

## 2019-04-23 NOTE — Progress Notes (Signed)
Patient has been called and DOB has been verified. Patient has been screened and transferred to PCP to start phone visit.    Patient would like to discuss potassium medication.

## 2019-04-26 ENCOUNTER — Other Ambulatory Visit: Payer: Self-pay

## 2019-04-26 ENCOUNTER — Ambulatory Visit: Payer: Medicare Other | Attending: Family Medicine

## 2019-04-26 DIAGNOSIS — I10 Essential (primary) hypertension: Secondary | ICD-10-CM | POA: Diagnosis not present

## 2019-04-26 DIAGNOSIS — E876 Hypokalemia: Secondary | ICD-10-CM | POA: Diagnosis not present

## 2019-04-27 LAB — CMP14+EGFR
ALT: 18 IU/L (ref 0–32)
AST: 45 IU/L — ABNORMAL HIGH (ref 0–40)
Albumin/Globulin Ratio: 0.6 — ABNORMAL LOW (ref 1.2–2.2)
Albumin: 3.1 g/dL — ABNORMAL LOW (ref 3.7–4.7)
Alkaline Phosphatase: 90 IU/L (ref 39–117)
BUN/Creatinine Ratio: 8 — ABNORMAL LOW (ref 12–28)
BUN: 13 mg/dL (ref 8–27)
Bilirubin Total: 1.1 mg/dL (ref 0.0–1.2)
CO2: 20 mmol/L (ref 20–29)
Calcium: 8.3 mg/dL — ABNORMAL LOW (ref 8.7–10.3)
Chloride: 103 mmol/L (ref 96–106)
Creatinine, Ser: 1.63 mg/dL — ABNORMAL HIGH (ref 0.57–1.00)
GFR calc Af Amer: 35 mL/min/{1.73_m2} — ABNORMAL LOW (ref >59)
GFR calc non Af Amer: 30 mL/min/{1.73_m2} — ABNORMAL LOW (ref >59)
Globulin, Total: 4.9 g/dL — ABNORMAL HIGH (ref 1.5–4.5)
Glucose: 79 mg/dL (ref 65–99)
Potassium: 3.2 mmol/L — ABNORMAL LOW (ref 3.5–5.2)
Sodium: 140 mmol/L (ref 134–144)
Total Protein: 8 g/dL (ref 6.0–8.5)

## 2019-04-27 LAB — LIPID PANEL
Chol/HDL Ratio: 2.4 ratio (ref 0.0–4.4)
Cholesterol, Total: 144 mg/dL (ref 100–199)
HDL: 61 mg/dL (ref >39)
LDL Calculated: 63 mg/dL (ref 0–99)
Triglycerides: 101 mg/dL (ref 0–149)
VLDL Cholesterol Cal: 20 mg/dL (ref 5–40)

## 2019-04-30 ENCOUNTER — Other Ambulatory Visit: Payer: Self-pay | Admitting: Family Medicine

## 2019-04-30 DIAGNOSIS — E876 Hypokalemia: Secondary | ICD-10-CM

## 2019-04-30 MED ORDER — POTASSIUM CHLORIDE ER 10 MEQ PO TBCR: 30.0000 meq | EXTENDED_RELEASE_TABLET | Freq: Every day | ORAL | 6 refills | Status: DC

## 2019-05-01 ENCOUNTER — Telehealth: Payer: Self-pay

## 2019-05-01 NOTE — Telephone Encounter (Signed)
-----   Message from [name], MD sent at 04/30/2019  1:33 PM EDT ----- Total cholesterol is normal but potassium is low.  I have increased her potassium to 30 mEq/day

## 2019-05-01 NOTE — Telephone Encounter (Signed)
Patient name and DOB has been verified Patient was informed of lab results. Patient had no questions.  

## 2019-05-09 DIAGNOSIS — H401122 Primary open-angle glaucoma, left eye, moderate stage: Secondary | ICD-10-CM | POA: Diagnosis not present

## 2019-05-09 DIAGNOSIS — H401412 Capsular glaucoma with pseudoexfoliation of lens, right eye, moderate stage: Secondary | ICD-10-CM | POA: Diagnosis not present

## 2019-05-10 DIAGNOSIS — Z933 Colostomy status: Secondary | ICD-10-CM | POA: Diagnosis not present

## 2019-05-15 ENCOUNTER — Ambulatory Visit: Payer: Medicare Other | Admitting: Obstetrics and Gynecology

## 2019-05-15 ENCOUNTER — Encounter: Payer: Self-pay | Admitting: Obstetrics and Gynecology

## 2019-05-15 ENCOUNTER — Other Ambulatory Visit: Payer: Self-pay

## 2019-05-15 VITALS — BP 156/87 | HR 123 | Wt 221.0 lb

## 2019-05-15 DIAGNOSIS — N950 Postmenopausal bleeding: Secondary | ICD-10-CM

## 2019-05-15 NOTE — Progress Notes (Signed)
Patient ID: Jackie Parker, female   DOB: Feb 05, 1941, 78 y.o.   MRN: [id number] Pt here for [facility] but having SOB d/t to her asthma Used MDI prior to coming to the office No PMB since July U/S thicken endometrium  PE O2 Sat 98 %  A/P PMB        Asthma  Will defer procedure today  Discussed with pt. Pt declined going to hospital. Pt instructed to go home, MDI's as directed, to hospital if no improvement or worsen of Sx in next 12-24 hrs.  Will reschedule EMBX

## 2019-05-15 NOTE — Progress Notes (Signed)
Pt states she is not feeling well today, asthma is acting up. Pt is having SOB today.  Pt states she wants to complete visit to "get it over with".

## 2019-07-02 ENCOUNTER — Telehealth: Payer: Self-pay | Admitting: Family Medicine

## 2019-07-02 NOTE — Telephone Encounter (Signed)
Spoke to Jackie Parker, verified name and DOB.  Patient states  bleeding onset over the weekend, for a course of 4 days now. Changes pad  twice daily or if she has a large clot.  She no longer wants to see her, GYN, Dr. Rip Harbour due to a disagreement nor anyone in the practice.   Denies SOB, dizziness, chest pain and was  instructed to go to ED if sx's onset or had increase bleeding. She does not take Megestrol. She had an IUD in place until 08/2018. Scheduled first available appointment on [facility] schedule- Thursday October 22 at 4pm. Pt verbalized understanding to instructions given.   Please advise otherwise.

## 2019-07-02 NOTE — Telephone Encounter (Signed)
Unfortunately I am unable to prescribe Megace as GYN notes revealed she was scheduled for endometrial biopsy due to thinckened endometrium.  This will be addressed at her visit in 2 days and if she develops lightheadedness will need to go to the emergency room.

## 2019-07-02 NOTE — Telephone Encounter (Signed)
Patient called wanting to speak to someone about vaginal bleeding. Will forward to triage pool for review. Please follow up.

## 2019-07-04 ENCOUNTER — Ambulatory Visit: Payer: Medicare Other | Attending: Family Medicine | Admitting: Physician Assistant

## 2019-07-04 ENCOUNTER — Ambulatory Visit: Payer: Medicare Other

## 2019-07-04 ENCOUNTER — Other Ambulatory Visit: Payer: Self-pay

## 2019-07-04 DIAGNOSIS — N950 Postmenopausal bleeding: Secondary | ICD-10-CM | POA: Diagnosis not present

## 2019-07-04 NOTE — Progress Notes (Signed)
Patient ID: Jackie Parker, female   DOB: [birth year], 78 y.o.   MRN: [id number] Virtual Visit via Telephone Note  I connected with [name] on 07/04/19 at  1:50 PM EDT by telephone and verified that I am speaking with the correct person using two identifiers.   I discussed the limitations, risks, security and privacy concerns of performing an evaluation and management service by telephone and the availability of in person appointments. I also discussed with the patient that there may be a patient responsible charge related to this service. The patient expressed understanding and agreed to proceed.  Patient location:  home My Location:  home office Persons on the call:  Me and the patient  History of Present Illness: Patient c/o postmenopausal bleeding.  Using about 4 pads a day since Saturday.  Apparently patient was scheduled for endometrial biopsy in September and refused the procedure due to wheezing.  She says she doesn't want to go back to "that doctor-Dr Rip Harbour."  She is also questioning why she needs to be "poked and prodded on."  She denies weakness or dizziness.  Says "this is no different for me" explaining that she has had an IUD and OCP at times since menopause to stop bleeding.     Observations/Objective:  NAD.  A&Ox3   Assessment and Plan: 1. Post-menopause bleeding Patient education and lengthy discussion about the need for endometrial biopsy.  I explained that post menopausal bleeding is not normal and warrants further workup. - Ambulatory referral to Gynecology -check CBC   Follow Up Instructions: ~10/2019 per PCP last note   I discussed the assessment and treatment plan with the patient. The patient was provided an opportunity to ask questions and all were answered. The patient agreed with the plan and demonstrated an understanding of the instructions.   The patient was advised to call back or seek an in-person evaluation if the symptoms worsen or if the condition  fails to improve as anticipated.  I provided 15 minutes of non-face-to-face time during this encounter.   Freeman Caldron, PA-C

## 2019-07-04 NOTE — Progress Notes (Signed)
Vaginal Bleeding since 06-29-19,   Referral to GYN doctor

## 2019-07-12 ENCOUNTER — Other Ambulatory Visit: Payer: Self-pay | Admitting: Obstetrics and Gynecology

## 2019-07-12 DIAGNOSIS — N950 Postmenopausal bleeding: Secondary | ICD-10-CM | POA: Diagnosis not present

## 2019-07-12 DIAGNOSIS — R9389 Abnormal findings on diagnostic imaging of other specified body structures: Secondary | ICD-10-CM | POA: Diagnosis not present

## 2019-07-19 DIAGNOSIS — N950 Postmenopausal bleeding: Secondary | ICD-10-CM | POA: Diagnosis not present

## 2019-07-19 DIAGNOSIS — R9389 Abnormal findings on diagnostic imaging of other specified body structures: Secondary | ICD-10-CM | POA: Diagnosis not present

## 2019-07-24 DIAGNOSIS — L400 Psoriasis vulgaris: Secondary | ICD-10-CM | POA: Diagnosis not present

## 2019-07-24 DIAGNOSIS — Z1231 Encounter for screening mammogram for malignant neoplasm of breast: Secondary | ICD-10-CM | POA: Diagnosis not present

## 2019-07-24 DIAGNOSIS — L819 Disorder of pigmentation, unspecified: Secondary | ICD-10-CM | POA: Diagnosis not present

## 2019-07-25 ENCOUNTER — Ambulatory Visit (INDEPENDENT_AMBULATORY_CARE_PROVIDER_SITE_OTHER): Payer: Medicare Other | Admitting: Orthopaedic Surgery

## 2019-07-25 ENCOUNTER — Ambulatory Visit: Payer: Self-pay

## 2019-07-25 ENCOUNTER — Encounter: Payer: Self-pay | Admitting: Orthopaedic Surgery

## 2019-07-25 ENCOUNTER — Other Ambulatory Visit: Payer: Self-pay

## 2019-07-25 ENCOUNTER — Telehealth: Payer: Self-pay | Admitting: Family Medicine

## 2019-07-25 DIAGNOSIS — M25561 Pain in right knee: Secondary | ICD-10-CM

## 2019-07-25 DIAGNOSIS — G8929 Other chronic pain: Secondary | ICD-10-CM

## 2019-07-25 MED ORDER — METHYLPREDNISOLONE ACETATE 40 MG/ML IJ SUSP
40.0000 mg | INTRAMUSCULAR | Status: AC | PRN
  Administered 2019-07-25: 40 mg via INTRA_ARTICULAR

## 2019-07-25 MED ORDER — BUPIVACAINE HCL 0.25 % IJ SOLN
2.0000 mL | INTRAMUSCULAR | Status: AC | PRN
  Administered 2019-07-25: 2 mL via INTRA_ARTICULAR

## 2019-07-25 MED ORDER — LIDOCAINE HCL 1 % IJ SOLN
2.0000 mL | INTRAMUSCULAR | Status: AC | PRN
  Administered 2019-07-25: 2 mL

## 2019-07-25 NOTE — Progress Notes (Signed)
Office Visit Note   Patient: Jackie Parker           Date of Birth: [birth year]           MRN: [id number] Visit Date: 07/25/2019              Requested by: [name], MD [place],  [place] [address] PCP: [name], MD   Assessment & Plan: Visit Diagnoses:  1. Chronic pain of right knee     Plan: Impression is status post right total knee replacement with subsequent pes bursitis.  We will inject the right knee pes bursa with cortisone today.  She will follow-up as needed.  Call with concerns or questions.  Follow-Up Instructions: Return if symptoms worsen or fail to improve.   Orders:  Orders Placed This Encounter  Procedures  . Large Joint Inj: R knee  . XR KNEE 3 VIEW RIGHT   No orders of the defined types were placed in this encounter.     Procedures: Large Joint Inj: R knee on 07/25/2019 9:35 AM Indications: pain Details: 22 G needle, anterolateral approach Medications: 2 mL bupivacaine 0.25 %; 2 mL lidocaine 1 %; 40 mg methylPREDNISolone acetate 40 MG/ML      Clinical Data: No additional findings.   Subjective: Chief Complaint  Patient presents with  . Right Knee - Pain    HPI patient is a pleasant 78 year old female who presents to our clinic today with continued right knee pain.  She is 3 years status post right total knee replacement by Dr. Louanne Skye, date of surgery 06/27/2016.  She has had intermittent pain to the right knee since.  Majority of her pain is to the medial aspect worse going up and down stairs.  She does have some pain at night when she is sleeping on her right side.  She has not been taking any over-the-counter pain medications for this.  Review of Systems as detailed in HPI.  All others reviewed and are negative.   Objective: Vital Signs: There were no vitals taken for this visit.  Physical Exam well-developed well-nourished female in no acute distress.  Alert and oriented x3.  Ortho Exam examination of  her right knee reveals a fully healed surgical scar without complication.  Range of motion 0 to 95 degrees.  Stable to valgus varus stress.  She does have marked tenderness over the pes bursa.  She has mild lateral joint line tenderness.  No patellofemoral crepitus.  She is neurovascularly intact distally.  Specialty Comments:  No specialty comments available.  Imaging: Xr Knee 3 View Right  Result Date: 07/25/2019 X-rays demonstrate a well-seated prosthesis without complication    PMFS History: Patient Active Problem List   Diagnosis Date Noted  . IUD check up 08/16/2017  . Asthma 01/30/2017  . Gynecologic exam normal 11/08/2016  . Urinary incontinence 11/08/2016  . Leukocytosis 06/29/2016  . Anemia 06/29/2016  . AKI (acute kidney injury) ([place])   . Primary localized osteoarthritis of right knee 06/27/2016  . Osteoarthritis of left knee 12/14/2015    Class: End Stage  . Osteoarthritis of right knee 12/14/2015  . Acute renal failure ([place]) 08/30/2013  . 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 N&V (nausea and vomiting) 08/30/2013  . Hypokalemia 08/30/2013  . Postmenopausal bleeding 03/18/2013  . OSTEOARTHRITIS, KNEE, LEFT 03/20/2010  . TRICUSPID REGURGITATION, MILD 03/03/2010  . RENAL INSUFFICIENCY 03/02/2010  . MORBID OBESITY 02/18/2010  . PREMATURE ATRIAL CONTRACTIONS 02/18/2010  . PREMATURE VENTRICULAR  CONTRACTIONS 02/18/2010  . CARDIAC MURMUR 02/18/2010  . EDEMA 10/20/2009  . HYPOKALEMIA 07/30/2009  . RIB PAIN, RIGHT SIDED 05/26/2008  . ALLERGIC RHINITIS 06/25/2007  . ABNORMAL BLOOD CHEMISTRY , OTHER 06/25/2007  . UNSPECIFIED VITAMIN D DEFICIENCY 04/30/2007  . Hypocalcemia 04/19/2007  . GLAUCOMA NEC 04/19/2007  . Essential hypertension 04/19/2007  . Asthma 04/19/2007  . Ulcerative colitis ([place]) 04/19/2007  . DISORDER, MENSTRUAL NEC 04/19/2007  . PSORIASIS 04/19/2007  . 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 GOUT NOS 02/21/2006   Past Medical History:   Diagnosis Date  . AKI (acute kidney injury) ([place]) 12/2015  . Allergy    takes Singulair and Zyrtec daily  . Asthma    uses Adair daily  . Cataracts, bilateral   . Colitis, ulcerative ([place])   . DJD (degenerative joint disease)   . Glaucoma    uses eye drops daily  . History of blood transfusion    no abnormal reaction noted. 40+ yrs ago  . History of bronchitis    yrs ago  . History of gout    not on any meds  . Hypertension    takes Amlodipine daily  . Joint pain   . Joint swelling   . Psoriasis   . Urinary frequency    takes Ditropan daily  . Vertigo    doesn't take any meds  . Vitamin D deficiency     Family History  Problem Relation Age of Onset  . Hypertension Sister   . Glaucoma Sister     Past Surgical History:  Procedure Laterality Date  . BACK SURGERY    . COLONOSCOPY    . ILEOSTOMY    . KNEE ARTHROPLASTY Left 12/14/2015   Procedure: COMPUTER ASSISTED LEFT TOTAL KNEE ARTHROPLASTY;  Surgeon: Jessy Oto, MD;  Location: [facility];  Service: Orthopedics;  Laterality: Left;  . TOTAL KNEE ARTHROPLASTY Left 12/14/2015  . TOTAL KNEE ARTHROPLASTY Right 06/27/2016  . TOTAL KNEE ARTHROPLASTY Right 06/27/2016   Procedure: RIGHT TOTAL KNEE ARTHROPLASTY;  Surgeon: Jessy Oto, MD;  Location: [facility];  Service: Orthopedics;  Laterality: Right;  . TUBAL LIGATION     Social History   Occupational History  . Not on file  Tobacco Use  . Smoking status: Never Smoker  . Smokeless tobacco: Never Used  Substance and Sexual Activity  . Alcohol use: No    Alcohol/week: 0.0 standard drinks  . Drug use: No  . Sexual activity: Never    Birth control/protection: I.U.D.

## 2019-07-29 ENCOUNTER — Other Ambulatory Visit: Payer: Self-pay

## 2019-07-29 ENCOUNTER — Encounter (HOSPITAL_BASED_OUTPATIENT_CLINIC_OR_DEPARTMENT_OTHER): Payer: Self-pay | Admitting: *Deleted

## 2019-08-01 ENCOUNTER — Other Ambulatory Visit: Payer: Self-pay

## 2019-08-01 ENCOUNTER — Inpatient Hospital Stay (HOSPITAL_COMMUNITY)
Admission: EM | Admit: 2019-08-01 | Discharge: 2019-08-06 | DRG: 988 | Disposition: A | Discharge status: Home / Self Care | Payer: Medicare Other | Source: Ambulatory Visit | Attending: Internal Medicine | Admitting: Internal Medicine

## 2019-08-01 ENCOUNTER — Encounter (HOSPITAL_COMMUNITY): Payer: Self-pay | Admitting: Internal Medicine

## 2019-08-01 ENCOUNTER — Emergency Department (HOSPITAL_COMMUNITY): Payer: Medicare Other

## 2019-08-01 ENCOUNTER — Other Ambulatory Visit (HOSPITAL_COMMUNITY): Admission: RE | Admit: 2019-08-01 | Payer: Medicare Other | Source: Ambulatory Visit

## 2019-08-01 ENCOUNTER — Encounter (HOSPITAL_BASED_OUTPATIENT_CLINIC_OR_DEPARTMENT_OTHER)
Admission: RE | Admit: 2019-08-01 | Discharge: 2019-08-01 | Disposition: A | Discharge status: Home / Self Care | Payer: Medicare Other | Source: Ambulatory Visit | Attending: Obstetrics and Gynecology | Admitting: Obstetrics and Gynecology

## 2019-08-01 DIAGNOSIS — Z96653 Presence of artificial knee joint, bilateral: Secondary | ICD-10-CM | POA: Diagnosis present

## 2019-08-01 DIAGNOSIS — L409 Psoriasis, unspecified: Secondary | ICD-10-CM | POA: Diagnosis present

## 2019-08-01 DIAGNOSIS — I071 Rheumatic tricuspid insufficiency: Secondary | ICD-10-CM | POA: Diagnosis not present

## 2019-08-01 DIAGNOSIS — M109 Gout, unspecified: Secondary | ICD-10-CM | POA: Diagnosis not present

## 2019-08-01 DIAGNOSIS — I10 Essential (primary) hypertension: Secondary | ICD-10-CM | POA: Diagnosis present

## 2019-08-01 DIAGNOSIS — R0609 Other forms of dyspnea: Secondary | ICD-10-CM

## 2019-08-01 DIAGNOSIS — Z8249 Family history of ischemic heart disease and other diseases of the circulatory system: Secondary | ICD-10-CM

## 2019-08-01 DIAGNOSIS — Z20828 Contact with and (suspected) exposure to other viral communicable diseases: Secondary | ICD-10-CM | POA: Diagnosis present

## 2019-08-01 DIAGNOSIS — H4089 Other specified glaucoma: Secondary | ICD-10-CM

## 2019-08-01 DIAGNOSIS — Z9851 Tubal ligation status: Secondary | ICD-10-CM

## 2019-08-01 DIAGNOSIS — K5190 Ulcerative colitis, unspecified, without complications: Secondary | ICD-10-CM | POA: Diagnosis not present

## 2019-08-01 DIAGNOSIS — Z83511 Family history of glaucoma: Secondary | ICD-10-CM

## 2019-08-01 DIAGNOSIS — H409 Unspecified glaucoma: Secondary | ICD-10-CM | POA: Diagnosis not present

## 2019-08-01 DIAGNOSIS — Z01818 Encounter for other preprocedural examination: Secondary | ICD-10-CM | POA: Insufficient documentation

## 2019-08-01 DIAGNOSIS — Z9049 Acquired absence of other specified parts of digestive tract: Secondary | ICD-10-CM

## 2019-08-01 DIAGNOSIS — J45909 Unspecified asthma, uncomplicated: Secondary | ICD-10-CM | POA: Diagnosis not present

## 2019-08-01 DIAGNOSIS — Z882 Allergy status to sulfonamides status: Secondary | ICD-10-CM | POA: Diagnosis not present

## 2019-08-01 DIAGNOSIS — N950 Postmenopausal bleeding: Secondary | ICD-10-CM | POA: Diagnosis present

## 2019-08-01 DIAGNOSIS — M170 Bilateral primary osteoarthritis of knee: Secondary | ICD-10-CM | POA: Diagnosis not present

## 2019-08-01 DIAGNOSIS — R0600 Dyspnea, unspecified: Secondary | ICD-10-CM | POA: Diagnosis not present

## 2019-08-01 DIAGNOSIS — Z79899 Other long term (current) drug therapy: Secondary | ICD-10-CM | POA: Diagnosis not present

## 2019-08-01 DIAGNOSIS — Z79890 Hormone replacement therapy (postmenopausal): Secondary | ICD-10-CM

## 2019-08-01 DIAGNOSIS — Z6841 Body Mass Index (BMI) 40.0 and over, adult: Secondary | ICD-10-CM

## 2019-08-01 DIAGNOSIS — I4891 Unspecified atrial fibrillation: Principal | ICD-10-CM | POA: Diagnosis present

## 2019-08-01 DIAGNOSIS — N939 Abnormal uterine and vaginal bleeding, unspecified: Secondary | ICD-10-CM | POA: Diagnosis not present

## 2019-08-01 DIAGNOSIS — I248 Other forms of acute ischemic heart disease: Secondary | ICD-10-CM | POA: Diagnosis present

## 2019-08-01 DIAGNOSIS — D62 Acute posthemorrhagic anemia: Secondary | ICD-10-CM | POA: Diagnosis not present

## 2019-08-01 DIAGNOSIS — E876 Hypokalemia: Secondary | ICD-10-CM | POA: Diagnosis present

## 2019-08-01 DIAGNOSIS — I480 Paroxysmal atrial fibrillation: Secondary | ICD-10-CM | POA: Diagnosis present

## 2019-08-01 DIAGNOSIS — R0602 Shortness of breath: Secondary | ICD-10-CM | POA: Diagnosis not present

## 2019-08-01 LAB — URINALYSIS, MICROSCOPIC (REFLEX): RBC / HPF: >50 RBC/hpf (ref 0–5)

## 2019-08-01 LAB — BASIC METABOLIC PANEL
Anion gap: 11 (ref 5–15)
Anion gap: 12 (ref 5–15)
BUN: 10 mg/dL (ref 8–23)
BUN: 11 mg/dL (ref 8–23)
CO2: 22 mmol/L (ref 22–32)
CO2: 22 mmol/L (ref 22–32)
Calcium: 8.3 mg/dL — ABNORMAL LOW (ref 8.9–10.3)
Calcium: 8.5 mg/dL — ABNORMAL LOW (ref 8.9–10.3)
Chloride: 104 mmol/L (ref 98–111)
Chloride: 104 mmol/L (ref 98–111)
Creatinine, Ser: 1.68 mg/dL — ABNORMAL HIGH (ref 0.44–1.00)
Creatinine, Ser: 1.73 mg/dL — ABNORMAL HIGH (ref 0.44–1.00)
GFR calc Af Amer: 32 mL/min — ABNORMAL LOW (ref >60)
GFR calc Af Amer: 33 mL/min — ABNORMAL LOW (ref >60)
GFR calc non Af Amer: 28 mL/min — ABNORMAL LOW (ref >60)
GFR calc non Af Amer: 29 mL/min — ABNORMAL LOW (ref >60)
Glucose, Bld: 134 mg/dL — ABNORMAL HIGH (ref 70–99)
Glucose, Bld: 95 mg/dL (ref 70–99)
Potassium: 2.4 mmol/L — CL (ref 3.5–5.1)
Potassium: 2.8 mmol/L — ABNORMAL LOW (ref 3.5–5.1)
Sodium: 137 mmol/L (ref 135–145)
Sodium: 138 mmol/L (ref 135–145)

## 2019-08-01 LAB — CBC
HCT: 33.5 % — ABNORMAL LOW (ref 36.0–46.0)
Hemoglobin: 11.2 g/dL — ABNORMAL LOW (ref 12.0–15.0)
MCH: 33.3 pg (ref 26.0–34.0)
MCHC: 33.4 g/dL (ref 30.0–36.0)
MCV: 99.7 fL (ref 80.0–100.0)
Platelets: 158 10*3/uL (ref 150–400)
RBC: 3.36 MIL/uL — ABNORMAL LOW (ref 3.87–5.11)
RDW: 13.2 % (ref 11.5–15.5)
WBC: 8 10*3/uL (ref 4.0–10.5)
nRBC: 0 % (ref 0.0–0.2)

## 2019-08-01 LAB — HEPATIC FUNCTION PANEL
ALT: 17 U/L (ref 0–44)
AST: 36 U/L (ref 15–41)
Albumin: 2.6 g/dL — ABNORMAL LOW (ref 3.5–5.0)
Alkaline Phosphatase: 69 U/L (ref 38–126)
Bilirubin, Direct: 0.3 mg/dL — ABNORMAL HIGH (ref 0.0–0.2)
Indirect Bilirubin: 0.7 mg/dL (ref 0.3–0.9)
Total Bilirubin: 1 mg/dL (ref 0.3–1.2)
Total Protein: 7.4 g/dL (ref 6.5–8.1)

## 2019-08-01 LAB — TROPONIN I (HIGH SENSITIVITY)
Troponin I (High Sensitivity): 93 ng/L — ABNORMAL HIGH (ref <18)
Troponin I (High Sensitivity): 80 ng/L — ABNORMAL HIGH (ref <18)

## 2019-08-01 LAB — MAGNESIUM: Magnesium: 1.4 mg/dL — ABNORMAL LOW (ref 1.7–2.4)

## 2019-08-01 LAB — SARS CORONAVIRUS 2 (TAT 6-24 HRS): SARS Coronavirus 2: NEGATIVE

## 2019-08-01 LAB — TSH: TSH: 2.775 u[IU]/mL (ref 0.350–4.500)

## 2019-08-01 LAB — BRAIN NATRIURETIC PEPTIDE: B Natriuretic Peptide: 209.8 pg/mL — ABNORMAL HIGH (ref 0.0–100.0)

## 2019-08-01 LAB — URINALYSIS, ROUTINE W REFLEX MICROSCOPIC
Bilirubin Urine: NEGATIVE
Glucose, UA: NEGATIVE mg/dL
Ketones, ur: NEGATIVE mg/dL
Nitrite: NEGATIVE
Protein, ur: NEGATIVE mg/dL
Specific Gravity, Urine: 1.01 (ref 1.005–1.030)
pH: 7 (ref 5.0–8.0)

## 2019-08-01 LAB — IRON AND TIBC
Iron: 82 ug/dL (ref 28–170)
Saturation Ratios: 26 % (ref 10.4–31.8)
TIBC: 316 ug/dL (ref 250–450)
UIBC: 234 ug/dL

## 2019-08-01 LAB — T4, FREE: Free T4: 1.08 ng/dL (ref 0.61–1.12)

## 2019-08-01 LAB — FERRITIN: Ferritin: 207 ng/mL (ref 11–307)

## 2019-08-01 MED ORDER — POTASSIUM CHLORIDE CRYS ER 20 MEQ PO TBCR
40.0000 meq | EXTENDED_RELEASE_TABLET | Freq: Once | ORAL | Status: AC
  Administered 2019-08-01: 40 meq via ORAL
  Filled 2019-08-01: qty 2

## 2019-08-01 MED ORDER — BRINZOLAMIDE 1 % OP SUSP
1.0000 [drp] | Freq: Two times a day (BID) | OPHTHALMIC | Status: DC
  Administered 2019-08-02 – 2019-08-06 (×5): 1 [drp] via OPHTHALMIC
  Filled 2019-08-01: qty 10

## 2019-08-01 MED ORDER — ALBUTEROL SULFATE HFA 108 (90 BASE) MCG/ACT IN AERS
1.0000 | INHALATION_SPRAY | Freq: Four times a day (QID) | RESPIRATORY_TRACT | Status: DC | PRN
  Administered 2019-08-03 – 2019-08-04 (×3): 1 via RESPIRATORY_TRACT
  Administered 2019-08-05: 8 via RESPIRATORY_TRACT
  Administered 2019-08-06: 1 via RESPIRATORY_TRACT
  Filled 2019-08-01: qty 6.7

## 2019-08-01 MED ORDER — MEDROXYPROGESTERONE ACETATE 10 MG PO TABS
10.0000 mg | ORAL_TABLET | Freq: Every day | ORAL | Status: DC
  Administered 2019-08-01 – 2019-08-02 (×2): 10 mg via ORAL
  Filled 2019-08-01 (×2): qty 1

## 2019-08-01 MED ORDER — MAGNESIUM SULFATE 2 GM/50ML IV SOLN
2.0000 g | Freq: Once | INTRAVENOUS | Status: AC
  Administered 2019-08-01: 2 g via INTRAVENOUS
  Filled 2019-08-01: qty 50

## 2019-08-01 MED ORDER — POTASSIUM CHLORIDE 10 MEQ/100ML IV SOLN
10.0000 meq | INTRAVENOUS | Status: AC
  Administered 2019-08-01 (×2): 10 meq via INTRAVENOUS
  Filled 2019-08-01 (×2): qty 100

## 2019-08-01 MED ORDER — FLUOCINONIDE 0.05 % EX OINT
1.0000 "application " | TOPICAL_OINTMENT | Freq: Two times a day (BID) | CUTANEOUS | Status: DC | PRN
  Administered 2019-08-02: 1 via TOPICAL
  Filled 2019-08-01 (×3): qty 15

## 2019-08-01 MED ORDER — DILTIAZEM LOAD VIA INFUSION
20.0000 mg | Freq: Once | INTRAVENOUS | Status: AC
  Administered 2019-08-01: 20 mg via INTRAVENOUS
  Filled 2019-08-01: qty 20

## 2019-08-01 MED ORDER — DILTIAZEM HCL-DEXTROSE 125-5 MG/125ML-% IV SOLN (PREMIX)
5.0000 mg/h | INTRAVENOUS | Status: DC
  Administered 2019-08-01 – 2019-08-02 (×2): 5 mg/h via INTRAVENOUS
  Filled 2019-08-01 (×2): qty 125

## 2019-08-01 MED ORDER — BRIMONIDINE TARTRATE 0.2 % OP SOLN
1.0000 [drp] | Freq: Three times a day (TID) | OPHTHALMIC | Status: DC
  Administered 2019-08-02 – 2019-08-06 (×8): 1 [drp] via OPHTHALMIC
  Filled 2019-08-01: qty 5

## 2019-08-01 MED ORDER — ENOXAPARIN SODIUM 30 MG/0.3ML ~~LOC~~ SOLN
30.0000 mg | SUBCUTANEOUS | Status: DC
  Administered 2019-08-01 – 2019-08-04 (×4): 30 mg via SUBCUTANEOUS
  Filled 2019-08-01 (×5): qty 0.3

## 2019-08-01 MED ORDER — ALLOPURINOL 100 MG PO TABS
100.0000 mg | ORAL_TABLET | Freq: Every day | ORAL | Status: DC
  Administered 2019-08-02 – 2019-08-04 (×3): 100 mg via ORAL
  Filled 2019-08-01 (×6): qty 1

## 2019-08-01 MED ORDER — POTASSIUM CHLORIDE 20 MEQ PO PACK
40.0000 meq | PACK | Freq: Once | ORAL | Status: AC
  Administered 2019-08-01: 40 meq via ORAL
  Filled 2019-08-01: qty 2

## 2019-08-01 MED ORDER — LATANOPROST 0.005 % OP SOLN
1.0000 [drp] | Freq: Every day | OPHTHALMIC | Status: DC
  Administered 2019-08-02 – 2019-08-05 (×4): 1 [drp] via OPHTHALMIC
  Filled 2019-08-01 (×2): qty 2.5

## 2019-08-01 MED ORDER — SODIUM CHLORIDE 0.9% FLUSH
3.0000 mL | Freq: Once | INTRAVENOUS | Status: AC
  Administered 2019-08-05: 3 mL via INTRAVENOUS

## 2019-08-01 NOTE — H&P (Addendum)
Date: 08/01/2019               Patient Name:  Jackie Parker MRN: [id number]  DOB: June 16, 1941 Age / Sex: 78 y.o., female   PCP: [name], MD              Medical Service: Internal Medicine Teaching Service              Attending Physician: Dr. Lucious Groves, DO    First Contact: Jackie Parker, [place] 3 Pager: [phone]  Second Contact: Dr. Madilyn Fireman Pager: [phone]  Third Contact Dr. Koleen Distance Pager: [phone]       After Hours (After 5p/  First Contact Pager: [phone]  weekends / holidays): Second Contact Pager: [phone]   Chief Complaint: shortness of breath  History of Present Illness: Jackie Parker is a 78 y.o. female with PMHx of hypertension, ulcerative colitis s/p total colectomy in 1977, glaucoma, bilateral knee osteoarthritis s/p bilateral total knee replacement (left in 12/2015, right in 06/2016), chronic hypokalemia, postmenopausal bleeding, psoriasis, and asthma who was brought to the ED due to atrial fibrillation with rapid ventricular response.  Patient was at a preop appointment earlier today for an D&C to treat abnormal uterine bleeding, and at that visit she was noted to have tachycardia with HR in the 170s so was sent to the ED.  EKG on arrival showed atrial fibrillation with RVR, with HR in the 180s.  Patient reports one month of progressively worsening dyspnea on exertion.  This occurs particularly on walking longer distances and is relieved by resting.  She denies chest pain, orthopnea or PND.  She initially attributed SOB to her asthma and has been using her inhalers without much relief.  She has chronic swelling in bilateral lower legs which she has been told is likely related to postop changes after her knee surgeries, and wears compression stockings.  Recently she had some increased swelling behind her right knee which improved following a corticosteroid injection from her orthopedist for pes bursitis.  She denies asymmetric swelling to the calf itself and  denies redness or pain to either calf.  Denies cough, fevers, or chills.  Denies headaches, focal weakness, or trouble speaking.  She denies prior history of similar symptoms.  No known history of Afib, CHF or CAD.  She has had vaginal bleeding for many years following menopause and has been diagnosed with endometrial polyps and simple hyperplasia in the past. She has been treated with progesterone OCP and IUD at varying times.  In 08/2018 she had her IUD taken out.  Bleeding started up again in 03/2019 so her prior GYN Dr. Rip Harbour started her on megestrol.  Initially her bleeding resolved but then started up again about a month ago at which time she was no longer taking the megestrol.  She was then referred to another GYN who started her on Provera 20m daily several weeks ago.  Bleeding has slowed down considerably but not completely stopped since starting the Provera.  She had an endometrial biopsy recently which was reportedly benign, and was schedule for a D&C on 11/23.  Meds:  Current Facility-Administered Medications [facility]19 [facility]  Medication  . indomethacin (INDOCIN) capsule 25 mg   Current Meds  Medication Sig  . albuterol (PROAIR HFA) 108 (90 Base) MCG/ACT inhaler INHALE 2 PUFFS INTO THE LUNGS EVERY 6 HOURS AS NEEDED FOR WHEEZING OR SHORTNESS OF BREATH  . allopurinol (ZYLOPRIM) 100 MG tablet TAKE 1 TABLET(100 MG) BY MOUTH DAILY (Patient taking  differently: Take 100 mg by mouth daily. )  . brimonidine (ALPHAGAN) 0.2 % ophthalmic solution Place 1 drop into the right eye 3 (three) times daily.  . brinzolamide (AZOPT) 1 % ophthalmic suspension Place 1 drop into the right eye 2 (two) times daily.  . cetirizine (ZYRTEC) 10 MG tablet TAKE 1 TABLET(10 MG) BY MOUTH DAILY (Patient taking differently: Take 10 mg by mouth daily. )  . cholecalciferol (VITAMIN D) 1000 units tablet Take 1,000 Units by mouth daily.  . fluocinonide ointment (LIDEX) 2.44 % Apply 1 application  topically 2 (two) times daily as needed for itching.  . fluticasone (FLOVENT HFA) 110 MCG/ACT inhaler Inhale 2 puffs into the lungs 2 (two) times daily.  . hydrochlorothiazide (HYDRODIURIL) 25 MG tablet Take 1 tablet (25 mg total) by mouth daily.  Marland Kitchen ibuprofen (ADVIL) 200 MG tablet Take 400 mg by mouth every 6 (six) hours as needed for headache or moderate pain.  Marland Kitchen LUMIGAN 0.01 % SOLN Place 1 drop into both eyes at bedtime.  . medroxyPROGESTERone (PROVERA) 10 MG tablet Take 10 mg by mouth daily.  . potassium chloride (K-DUR) 10 MEQ tablet Take 3 tablets (30 mEq total) by mouth daily.  . prochlorperazine (COMPAZINE) 10 MG tablet TAKE 1 TABLET(10 MG) BY MOUTH TWICE DAILY AS NEEDED FOR NAUSEA OR VOMITING (Patient taking differently: Take 10 mg by mouth 2 (two) times daily as needed for nausea or vomiting. )     Allergies: Allergies as of 08/01/2019 - Review Complete 08/01/2019  Allergen Reaction Noted  . Sulfonamide derivatives Rash 04/19/2007   Past Medical History:  Diagnosis Date  . AKI (acute kidney injury) ([place]) 12/2015  . Allergy    takes Singulair and Zyrtec daily  . Asthma    uses Adair daily  . Cataracts, bilateral   . Colitis, ulcerative ([place])   . DJD (degenerative joint disease)   . Glaucoma    uses eye drops daily  . History of blood transfusion    no abnormal reaction noted. 40+ yrs ago  . History of bronchitis    yrs ago  . History of gout    not on any meds  . Hypertension    takes Amlodipine daily  . Joint pain   . Joint swelling   . PMB (postmenopausal bleeding)   . Psoriasis   . Urinary frequency    takes Ditropan daily  . Vertigo    doesn't take any meds  . Vitamin D deficiency     Family History: Denies family history of heart disease.    Social History: Lives with daughter in [place].  Tries to stay fairly active but has been less so over the past month.  Denies alcohol, tobacco or illicit drug use.  Review of Systems: A complete ROS was negative  except as per HPI.   Physical Exam: Blood pressure 109/69, pulse (!) 113, temperature 98.6 F (37 C), temperature source Oral, resp. rate (!) 24, height 5' 2"  (1.575 m), weight 95.3 kg, SpO2 98 %. General: awake, alert, lying comfortably in bed in NAD Pulm: lungs with end-expiratory wheezing bilaterally, tachypnea, normal work of breathing on room air CV: tachycardic rate and irregularly irregular rhythm GI: abdomen soft, nontender, nondistended MSK: both calves appeared symmetrically swollen, dressed in compression stockings; right stocking pulled down to reveal 2+ pretibial pitting edema in right lower extremity; left stocking left in place Neuro: A&Ox3; no focal deficits Skin: warm and dry; venous stasis changes on right calf Psych: normal mood and affect  EKG: personally reviewed my interpretation is rapid rate, irregularly irregular rhythm, no visible P waves, nonspecific ST changes  CXR: personally reviewed my interpretation is slightly enlarged cardiac silhouette but stable from 03/22/2018, no pulmonary infiltrates or consolidation, no effusions  Assessment & Plan by Problem: Active Problems:   Atrial fibrillation with RVR (HCC)  Atrial fibrillation with RVR:  No prior diagnosis of Afib or heart disease. Unclear trigger but may be related to anemia, with Hgb of 11.2 (down from last known Hgb of 15.4 in 03/2018) likely due to AUB.  No signs of recent infection.  UA with large Hgb but only trace leukocytes, few bacteria (likely due to bleeding rather than UTI).  No drug or alcohol use.  TSH/FT4 normal.  She was given IV diltiazem in the ED and HR dropped from 176 on arrival to around the 80s-100s several hours later.  She has continued to be tachypneic with RR ranging from 18-26.  BP was initially slightly soft, as low as 109/69, but this resolved following rate control.  Otherwise hemodynamically stable, no hypoxia on room air.  No signs of CVA.  Cardioversion contraindicated given  duration of dyspnea which could reflect several weeks of Afib.  CHADS-VASC score is 4 and HAS-BLED is 2-3, suggesting anticoagulation is likely indicated. - Continue IV diltiazem - Repeat EKG tomorrow morning - Transition to PO metoprolol tomorrow - Hold anticoagulation for now (other than Lovenox for DVT ppx) pending discussion with patient about CVA risk vs bleeding risk  Exertional dyspnea: May be due to Afib but differential also includes new-onset heart failure (potentially due to Afib) vs ACS vs PE vs symptomatic anemia vs asthma.  Slightly elevated BNP of 210 raises some concern for CHF, although CXR with no signs of pulmonary edema is reassuring.  Even without anginal chest pain, exertional dyspnea in female patient could represent ACS, although she has no known h/o CAD and minimal risk factors.  PE could cause new-onset Afib and this should be considered given her dyspnea, tachycardia and tachypnea; however she has no signs or symptoms of DVT, low Well's score (1.5) which makes this less likely.  Also no strong risk factors for PE (OCPs used for AUB recently have been progesterone-only).  She has had a drop in her Hgb as above.  While relatively mild, this could lead to dyspnea depending on rate of change.  She does have a history of asthma and on exam she has end-expiratory wheezing consistent with airway obstruction.  However she states her dyspnea has not been relieved by her inhalers. - Continue to monitor respiratory status and consider further workup for CHF if she remains dyspneic following rate control - Troponin level pending - Consider CTA if RVR is refractory or she becomes hypoxic  Hypokalemia: Has chronic hypokalemia, potentially due to hydrochlorothiazide vs total colectomy, and takes 30 mEq PO supplementation daily.  K 2.4 on arrival, down from baseline of around 3-4.  Received oral and IV repletion in the ED. - Continue to trend K and replete prn  Hypomagnesemia: Mg 1.4 on  arrival.  Received IV repletion in the ED. - Continue to trend Mg and replete prn  Hypertension: BP currently well-controlled - Hold hydrochlorothiazide today given slightly soft BP earlier during episode of RVR; likely resume tomorrow  Asthma: Wheezing on exam suggests asthma may be suboptimally controlled, although no signs of asthma exacerbation.  May need modification of her inhaler regimen if she continues to be dyspneic in spite of rate control.   -  Albuterol 1 puff q6hrs prn  Glaucoma:  - Continue home eye drops  Abnormal uterine bleeding: Needs outpatient follow-up with GYN to treat AUB - Continue Provera  Gout: No flares recently - Continue Allopurinol daily  CODE STATUS: FULL  Dispo: Admit patient to Inpatient with expected length of stay greater than 2 midnights.  Signed: Tonia Ghent, Medical Student 08/01/2019, 3:09 PM  Pager: [phone]

## 2019-08-01 NOTE — ED Triage Notes (Signed)
Pt reports several weeks of SOB, weakness. HR fast in triage. A&)x4.

## 2019-08-01 NOTE — Progress Notes (Signed)
EKG revealed new onset Afib, Dr. Doroteo Glassman instructed pt to go to ED to be evaluated, pt transported to car via wheelchair and communicated plan of care with pt's daughter, family verbalized understanding. Notified Dr. Sundra Aland office, spoke with [facility].

## 2019-08-01 NOTE — ED Notes (Signed)
Report given to Amanda RN

## 2019-08-01 NOTE — Progress Notes (Signed)
Pt seen today in preoperative care prior to GYN surgery on 10/23. C/o new onset (about 2 weeks) SOB at rest as well as exertion and new onset B/L LE swelling. EKG revealed Afib w/ RVR. Has never seen a cardiologist before. Advised patient to go to ED for evaluation, concern for acute decompensated heart failure 2/2 new onset Afib.

## 2019-08-01 NOTE — ED Provider Notes (Signed)
[facility] EMERGENCY DEPARTMENT Provider Note   CSN: [id number] Arrival date & time: 08/01/19  1044     History   Chief Complaint Chief Complaint  Patient presents with  . Tachycardia  . Shortness of Breath    HPI Jackie Parker is a 78 y.o. female.     78 y.o female with a PMH of AKI, Asthma presents to the ED sent in by anesthesia from her pre-op appointment. Patient was schedule for an endometrial biopsy when she was found to have a heart rate in the 170's this morning. She reports she has been feeling shortness of breath for a few weeks. She reports the shortness of breath is not exacerbated or alleviated by any factors. According to patient's chart she had vaginal bleeding which is the reason for the procedure, she reports the bleeding has improved. She is currently not on any anticoagulation medication, she not have a prior history of A. fib.  No prior history of blood clots, no chest pain, fevers, coughs.  The history is provided by the patient and medical records.  Shortness of Breath Associated symptoms: no abdominal pain, no chest pain, no cough, no ear pain, no fever, no rash, no sore throat and no vomiting     Past Medical History:  Diagnosis Date  . AKI (acute kidney injury) ([place]) 12/2015  . Allergy    takes Singulair and Zyrtec daily  . Asthma    uses Adair daily  . Cataracts, bilateral   . Colitis, ulcerative ([place])   . DJD (degenerative joint disease)   . Glaucoma    uses eye drops daily  . History of blood transfusion    no abnormal reaction noted. 40+ yrs ago  . History of bronchitis    yrs ago  . History of gout    not on any meds  . Hypertension    takes Amlodipine daily  . Joint pain   . Joint swelling   . PMB (postmenopausal bleeding)   . Psoriasis   . Urinary frequency    takes Ditropan daily  . Vertigo    doesn't take any meds  . Vitamin D deficiency     Patient Active Problem List   Diagnosis Date Noted  . IUD  check up 08/16/2017  . Asthma 01/30/2017  . Gynecologic exam normal 11/08/2016  . Urinary incontinence 11/08/2016  . Leukocytosis 06/29/2016  . Anemia 06/29/2016  . AKI (acute kidney injury) ([place])   . Primary localized osteoarthritis of right knee 06/27/2016  . Osteoarthritis of left knee 12/14/2015    Class: End Stage  . Osteoarthritis of right knee 12/14/2015  . Acute renal failure ([place]) 08/30/2013  . 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 N&V (nausea and vomiting) 08/30/2013  . Hypokalemia 08/30/2013  . Postmenopausal bleeding 03/18/2013  . OSTEOARTHRITIS, KNEE, LEFT 03/20/2010  . TRICUSPID REGURGITATION, MILD 03/03/2010  . RENAL INSUFFICIENCY 03/02/2010  . MORBID OBESITY 02/18/2010  . PREMATURE ATRIAL CONTRACTIONS 02/18/2010  . PREMATURE VENTRICULAR CONTRACTIONS 02/18/2010  . CARDIAC MURMUR 02/18/2010  . EDEMA 10/20/2009  . HYPOKALEMIA 07/30/2009  . RIB PAIN, RIGHT SIDED 05/26/2008  . ALLERGIC RHINITIS 06/25/2007  . ABNORMAL BLOOD CHEMISTRY , OTHER 06/25/2007  . UNSPECIFIED VITAMIN D DEFICIENCY 04/30/2007  . Hypocalcemia 04/19/2007  . GLAUCOMA NEC 04/19/2007  . Essential hypertension 04/19/2007  . Asthma 04/19/2007  . Ulcerative colitis ([place]) 04/19/2007  . DISORDER, MENSTRUAL NEC 04/19/2007  . PSORIASIS 04/19/2007  . 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  GOUT NOS 02/21/2006    Past Surgical History:  Procedure Laterality Date  . BACK SURGERY    . COLONOSCOPY    . ILEOSTOMY    . KNEE ARTHROPLASTY Left 12/14/2015   Procedure: COMPUTER ASSISTED LEFT TOTAL KNEE ARTHROPLASTY;  Surgeon: Jessy Oto, MD;  Location: [facility];  Service: Orthopedics;  Laterality: Left;  . TOTAL KNEE ARTHROPLASTY Left 12/14/2015  . TOTAL KNEE ARTHROPLASTY Right 06/27/2016  . TOTAL KNEE ARTHROPLASTY Right 06/27/2016   Procedure: RIGHT TOTAL KNEE ARTHROPLASTY;  Surgeon: Jessy Oto, MD;  Location: [facility];  Service: Orthopedics;  Laterality: Right;  . TUBAL LIGATION       OB History     Gravida  5   Para  5   Term  5   Preterm      AB      Living  5     SAB      TAB      Ectopic      Multiple      Live Births  5            Home Medications    Prior to Admission medications   Medication Sig Start Date End Date Taking? Authorizing Provider  albuterol (PROAIR HFA) 108 (90 Base) MCG/ACT inhaler INHALE 2 PUFFS INTO THE LUNGS EVERY 6 HOURS AS NEEDED FOR WHEEZING OR SHORTNESS OF BREATH 08/21/18  Yes Newlin, Enobong, MD  allopurinol (ZYLOPRIM) 100 MG tablet TAKE 1 TABLET(100 MG) BY MOUTH DAILY Patient taking differently: Take 100 mg by mouth daily.  04/01/19  Yes [name], MD  brimonidine (ALPHAGAN) 0.2 % ophthalmic solution Place 1 drop into the right eye 3 (three) times daily. 06/18/19  Yes [provider]  brinzolamide (AZOPT) 1 % ophthalmic suspension Place 1 drop into the right eye 2 (two) times daily.   Yes [provider]  cetirizine (ZYRTEC) 10 MG tablet TAKE 1 TABLET(10 MG) BY MOUTH DAILY Patient taking differently: Take 10 mg by mouth daily.  11/01/18  Yes [name], MD  cholecalciferol (VITAMIN D) 1000 units tablet Take 1,000 Units by mouth daily.   Yes [provider]  fluocinonide ointment (LIDEX) 0.37 % Apply 1 application topically 2 (two) times daily as needed for itching. 07/24/19  Yes [provider]  fluticasone (FLOVENT HFA) 110 MCG/ACT inhaler Inhale 2 puffs into the lungs 2 (two) times daily. 08/21/18  Yes [name], MD  hydrochlorothiazide (HYDRODIURIL) 25 MG tablet Take 1 tablet (25 mg total) by mouth daily. 04/23/19  Yes [name], MD  ibuprofen (ADVIL) 200 MG tablet Take 400 mg by mouth every 6 (six) hours as needed for headache or moderate pain.   Yes [provider]  LUMIGAN 0.01 % SOLN Place 1 drop into both eyes at bedtime. 05/27/16  Yes [provider]  medroxyPROGESTERone (PROVERA) 10 MG tablet Take 10 mg by mouth daily.   Yes [provider]   potassium chloride (K-DUR) 10 MEQ tablet Take 3 tablets (30 mEq total) by mouth daily. 04/30/19  Yes [name], MD  prochlorperazine (COMPAZINE) 10 MG tablet TAKE 1 TABLET(10 MG) BY MOUTH TWICE DAILY AS NEEDED FOR NAUSEA OR VOMITING Patient taking differently: Take 10 mg by mouth 2 (two) times daily as needed for nausea or vomiting.  11/01/18  Yes [name], MD  colchicine 0.6 MG tablet Take 2 tabs (1.2 mg) at the onset of a gout attack, may repeat 1 tab (0.6 mg) 2 hours later if symptoms persist.  10/22/18   [name], MD  mirabegron ER (MYRBETRIQ) 25 MG TB24 tablet Take 1 tablet (25 mg total) by mouth daily. Patient not taking: Reported on 08/01/2019 04/23/19   [name], MD    Family History Family History  Problem Relation Age of Onset  . Hypertension Sister   . Glaucoma Sister     Social History Social History   Tobacco Use  . Smoking status: Never Smoker  . Smokeless tobacco: Never Used  Substance Use Topics  . Alcohol use: No    Alcohol/week: 0.0 standard drinks  . Drug use: No     Allergies   Sulfonamide derivatives   Review of Systems Review of Systems  Constitutional: Negative for chills and fever.  HENT: Negative for ear pain and sore throat.   Eyes: Negative for pain and visual disturbance.  Respiratory: Positive for shortness of breath. Negative for cough.   Cardiovascular: Negative for chest pain and palpitations.  Gastrointestinal: Negative for abdominal pain, blood in stool, nausea and vomiting.  Genitourinary: Negative for dysuria and hematuria.  Musculoskeletal: Negative for arthralgias and back pain.  Skin: Negative for color change and rash.  Neurological: Negative for seizures and syncope.  All other systems reviewed and are negative.    Physical Exam Updated Vital Signs BP 109/69   Pulse (!) 113   Temp 98.6 F (37 C) (Oral)   Resp (!) 24   Ht 5' 2"  (1.575 m)   Wt 95.3 kg   SpO2 98%   BMI 38.41 kg/m   Physical  Exam Vitals signs and nursing note reviewed.  Constitutional:      Appearance: She is well-developed. She is diaphoretic.  HENT:     Head: Normocephalic and atraumatic.  Neck:     Musculoskeletal: Normal range of motion and neck supple.  Cardiovascular:     Rate and Rhythm: Tachycardia present. Rhythm irregular.  Pulmonary:     Effort: Tachypnea present.     Breath sounds: No decreased breath sounds or wheezing.  Chest:     Chest wall: No tenderness.  Abdominal:     General: Bowel sounds are normal.     Palpations: Abdomen is soft. There is no mass.     Tenderness: There is no abdominal tenderness.  Musculoskeletal:     Comments: No bilateral pitting edema, no calf tenderness.  Skin:    General: Skin is warm.  Neurological:     Mental Status: She is alert and oriented to person, place, and time.      ED Treatments / Results  Labs (all labs ordered are listed, but only abnormal results are displayed) Labs Reviewed  BASIC METABOLIC PANEL - Abnormal; Notable for the following components:      Result Value   Potassium 2.4 (*)    Creatinine, Ser 1.68 (*)    Calcium 8.5 (*)    GFR calc non Af Amer 29 (*)    GFR calc Af Amer 33 (*)    All other components within normal limits  CBC - Abnormal; Notable for the following components:   RBC 3.36 (*)    Hemoglobin 11.2 (*)    HCT 33.5 (*)    All other components within normal limits  BRAIN NATRIURETIC PEPTIDE - Abnormal; Notable for the following components:   B Natriuretic Peptide 209.8 (*)    All other components within normal limits  HEPATIC FUNCTION PANEL - Abnormal; Notable for the following components:   Albumin 2.6 (*)    Bilirubin, Direct  0.3 (*)    All other components within normal limits  MAGNESIUM - Abnormal; Notable for the following components:   Magnesium 1.4 (*)    All other components within normal limits  URINE CULTURE  TSH  T4, FREE  URINALYSIS, ROUTINE W REFLEX MICROSCOPIC    EKG EKG  Interpretation  Date/Time:  Thursday August 01 2019 10:57:30 EST Ventricular Rate:  185 PR Interval:    QRS Duration: 78 QT Interval:  250 QTC Calculation: 438 R Axis:   19 Text Interpretation: afib with rvr Undetermined rhythm Marked ST abnormality, possible inferior subendocardial injury Marked ST abnormality, possible septal subendocardial injury Abnormal ECG Otherwise no significant change Confirmed by Deno Etienne [phone]) on 08/01/2019 11:02:31 AM   Radiology Dg Chest Port 1 View  Result Date: 08/01/2019 CLINICAL DATA:  Shortness of breath. EXAM: PORTABLE CHEST 1 VIEW COMPARISON:  July 11th 2019. FINDINGS: Stable cardiomediastinal silhouette. No pneumothorax or pleural effusion is noted. Lungs are clear. Bony thorax is unremarkable. IMPRESSION: No active disease. Electronically Signed   By: Marijo Conception M.D.   On: 08/01/2019 12:01    Procedures .Critical Care Performed by: Janeece Fitting, PA-C Authorized by: Janeece Fitting, PA-C   Critical care provider statement:    Critical care time (minutes):  35   Critical care start time:  08/01/2019 11:30 AM   Critical care end time:  08/01/2019 12:05 PM   Critical care time was exclusive of:  Separately billable procedures and treating other patients   Critical care was necessary to treat or prevent imminent or life-threatening deterioration of the following conditions:  Cardiac failure   Critical care was time spent personally by me on the following activities:  Blood draw for specimens, development of treatment plan with patient or surrogate, discussions with consultants, evaluation of patient's response to treatment, examination of patient, obtaining history from patient or surrogate, ordering and performing treatments and interventions, ordering and review of laboratory studies, ordering and review of radiographic studies, pulse oximetry, re-evaluation of patient's condition and review of old charts   (including critical care  time)  Medications Ordered in ED Medications  sodium chloride flush (NS) 0.9 % injection 3 mL (3 mLs Intravenous Not Given 08/01/19 1156)  diltiazem (CARDIZEM) 1 mg/mL load via infusion 20 mg (20 mg Intravenous Bolus from Bag 08/01/19 1132)    And  diltiazem (CARDIZEM) 125 mg in dextrose 5% 125 mL (1 mg/mL) infusion (7.5 mg/hr Intravenous Rate/Dose Change 08/01/19 1329)  potassium chloride 10 mEq in 100 mL IVPB (10 mEq Intravenous New Bag/Given 08/01/19 1356)  potassium chloride (KLOR-CON) packet 40 mEq (has no administration in time range)  magnesium sulfate IVPB 2 g 50 mL (2 g Intravenous New Bag/Given 08/01/19 1352)     Initial Impression / Assessment and Plan / ED Course  I have reviewed the triage vital signs and the nursing notes.  Pertinent labs & imaging results that were available during my care of the patient were reviewed by me and considered in my medical decision making (see chart for details).       Patient with extensive past medical history presents to the ED sent in by anesthesia, patient was at her preop appointment when they suddenly found her to be in new onset A. fib, heart rate was around the 170s, she reports has been feeling short of breath for the past week, unknown timeline of the A. fib onset.  She is currently not on any anticoagulation medication.  Arrived in the ED with  a heart rate in the 170s, EKG showed A. fib with RVR.  With a heart rate of 85.  She was placed on Cardizem drip 20 mg for rate control.  1:34 PM laboratory results were remarkable for hypokalemia at 2.4, she has now been provided with IV potassium x2 along with oral potassium x2 as she tolerates this.  Her hepatic function showed no abnormalities in her LFTs.  Magnesium is slightly decreased however this has been the case in the past.  BNP is slightly elevated at 209, denies any prior history of heart failure.  T4 is within normal limits with TSH.  CBC without any leukocytosis.  Her chest  x-ray showed:  No consolidation, pneumothorax, pleural effusion.  This patients CHA2DS2-VASc Score and unadjusted Ischemic Stroke Rate (% per year) is equal to 4.8 % stroke rate/year from a score of 4  Above score calculated as 1 point each if present [CHF, HTN, DM, Vascular=MI/PAD/Aortic Plaque, Age if 65-74, or Female] Above score calculated as 2 points each if present [Age > 75, or Stroke/TIA/TE]  2:02 PM spoke to teaching service who will admit patient for further management of her A. fib, hypokalemia, further inpatient treatment.  She is still pending UA, has been formed on plan and management and is agreeable of further mission.   Portions of this note were generated with Lobbyist. Dictation errors may occur despite best attempts at proofreading.  Final Clinical Impressions(s) / ED Diagnoses   Final diagnoses:  Atrial fibrillation with RVR ([place])  Hypokalemia  Hypomagnesemia    ED Discharge Orders    None       Janeece Fitting, PA-C 08/01/19 [place], DO 08/01/19 1422

## 2019-08-01 NOTE — ED Notes (Signed)
Daughter updated on phone

## 2019-08-02 ENCOUNTER — Observation Stay (HOSPITAL_BASED_OUTPATIENT_CLINIC_OR_DEPARTMENT_OTHER): Payer: Medicare Other

## 2019-08-02 DIAGNOSIS — Z79890 Hormone replacement therapy (postmenopausal): Secondary | ICD-10-CM | POA: Diagnosis not present

## 2019-08-02 DIAGNOSIS — Z79899 Other long term (current) drug therapy: Secondary | ICD-10-CM | POA: Diagnosis not present

## 2019-08-02 DIAGNOSIS — Z9049 Acquired absence of other specified parts of digestive tract: Secondary | ICD-10-CM | POA: Diagnosis not present

## 2019-08-02 DIAGNOSIS — I248 Other forms of acute ischemic heart disease: Secondary | ICD-10-CM | POA: Diagnosis not present

## 2019-08-02 DIAGNOSIS — Z8249 Family history of ischemic heart disease and other diseases of the circulatory system: Secondary | ICD-10-CM | POA: Diagnosis not present

## 2019-08-02 DIAGNOSIS — I10 Essential (primary) hypertension: Secondary | ICD-10-CM | POA: Diagnosis not present

## 2019-08-02 DIAGNOSIS — I071 Rheumatic tricuspid insufficiency: Secondary | ICD-10-CM | POA: Diagnosis not present

## 2019-08-02 DIAGNOSIS — N939 Abnormal uterine and vaginal bleeding, unspecified: Secondary | ICD-10-CM | POA: Diagnosis not present

## 2019-08-02 DIAGNOSIS — D62 Acute posthemorrhagic anemia: Secondary | ICD-10-CM

## 2019-08-02 DIAGNOSIS — M109 Gout, unspecified: Secondary | ICD-10-CM | POA: Diagnosis not present

## 2019-08-02 DIAGNOSIS — I361 Nonrheumatic tricuspid (valve) insufficiency: Secondary | ICD-10-CM | POA: Diagnosis not present

## 2019-08-02 DIAGNOSIS — Z83511 Family history of glaucoma: Secondary | ICD-10-CM | POA: Diagnosis not present

## 2019-08-02 DIAGNOSIS — R0600 Dyspnea, unspecified: Secondary | ICD-10-CM | POA: Diagnosis not present

## 2019-08-02 DIAGNOSIS — E876 Hypokalemia: Secondary | ICD-10-CM | POA: Diagnosis not present

## 2019-08-02 DIAGNOSIS — Z882 Allergy status to sulfonamides status: Secondary | ICD-10-CM | POA: Diagnosis not present

## 2019-08-02 DIAGNOSIS — Z9851 Tubal ligation status: Secondary | ICD-10-CM | POA: Diagnosis not present

## 2019-08-02 DIAGNOSIS — N950 Postmenopausal bleeding: Secondary | ICD-10-CM | POA: Diagnosis not present

## 2019-08-02 DIAGNOSIS — J45909 Unspecified asthma, uncomplicated: Secondary | ICD-10-CM | POA: Diagnosis not present

## 2019-08-02 DIAGNOSIS — R0609 Other forms of dyspnea: Secondary | ICD-10-CM

## 2019-08-02 DIAGNOSIS — H409 Unspecified glaucoma: Secondary | ICD-10-CM | POA: Diagnosis present

## 2019-08-02 DIAGNOSIS — Z96653 Presence of artificial knee joint, bilateral: Secondary | ICD-10-CM | POA: Diagnosis present

## 2019-08-02 DIAGNOSIS — R0602 Shortness of breath: Secondary | ICD-10-CM | POA: Diagnosis present

## 2019-08-02 DIAGNOSIS — L409 Psoriasis, unspecified: Secondary | ICD-10-CM | POA: Diagnosis present

## 2019-08-02 DIAGNOSIS — I4891 Unspecified atrial fibrillation: Secondary | ICD-10-CM | POA: Diagnosis not present

## 2019-08-02 DIAGNOSIS — Z6841 Body Mass Index (BMI) 40.0 and over, adult: Secondary | ICD-10-CM | POA: Diagnosis not present

## 2019-08-02 DIAGNOSIS — Z20828 Contact with and (suspected) exposure to other viral communicable diseases: Secondary | ICD-10-CM | POA: Diagnosis not present

## 2019-08-02 LAB — CBC
HCT: 26.3 % — ABNORMAL LOW (ref 36.0–46.0)
Hemoglobin: 9.1 g/dL — ABNORMAL LOW (ref 12.0–15.0)
MCH: 33.8 pg (ref 26.0–34.0)
MCHC: 34.6 g/dL (ref 30.0–36.0)
MCV: 97.8 fL (ref 80.0–100.0)
Platelets: 152 10*3/uL (ref 150–400)
RBC: 2.69 MIL/uL — ABNORMAL LOW (ref 3.87–5.11)
RDW: 13.3 % (ref 11.5–15.5)
WBC: 8.4 10*3/uL (ref 4.0–10.5)
nRBC: 0 % (ref 0.0–0.2)

## 2019-08-02 LAB — BASIC METABOLIC PANEL
Anion gap: 9 (ref 5–15)
BUN: 10 mg/dL (ref 8–23)
CO2: 24 mmol/L (ref 22–32)
Calcium: 8.1 mg/dL — ABNORMAL LOW (ref 8.9–10.3)
Chloride: 107 mmol/L (ref 98–111)
Creatinine, Ser: 1.68 mg/dL — ABNORMAL HIGH (ref 0.44–1.00)
GFR calc Af Amer: 33 mL/min — ABNORMAL LOW (ref >60)
GFR calc non Af Amer: 29 mL/min — ABNORMAL LOW (ref >60)
Glucose, Bld: 84 mg/dL (ref 70–99)
Potassium: 3.2 mmol/L — ABNORMAL LOW (ref 3.5–5.1)
Sodium: 140 mmol/L (ref 135–145)

## 2019-08-02 LAB — ECHOCARDIOGRAM COMPLETE
Height: 62 in
Weight: 3548.8 oz

## 2019-08-02 LAB — MAGNESIUM: Magnesium: 1.7 mg/dL (ref 1.7–2.4)

## 2019-08-02 MED ORDER — METOPROLOL TARTRATE 25 MG PO TABS
25.0000 mg | ORAL_TABLET | Freq: Two times a day (BID) | ORAL | Status: DC
  Administered 2019-08-02 – 2019-08-06 (×9): 25 mg via ORAL
  Filled 2019-08-02 (×9): qty 1

## 2019-08-02 MED ORDER — MEDROXYPROGESTERONE ACETATE 10 MG PO TABS
20.0000 mg | ORAL_TABLET | Freq: Every day | ORAL | Status: DC
  Administered 2019-08-03 – 2019-08-06 (×3): 20 mg via ORAL
  Filled 2019-08-02 (×4): qty 2

## 2019-08-02 MED ORDER — POTASSIUM CHLORIDE CRYS ER 20 MEQ PO TBCR
40.0000 meq | EXTENDED_RELEASE_TABLET | Freq: Three times a day (TID) | ORAL | Status: DC
  Administered 2019-08-02 – 2019-08-04 (×7): 40 meq via ORAL
  Filled 2019-08-02 (×7): qty 2

## 2019-08-02 MED ORDER — MAGNESIUM SULFATE 2 GM/50ML IV SOLN
2.0000 g | Freq: Once | INTRAVENOUS | Status: AC
  Administered 2019-08-02: 2 g via INTRAVENOUS
  Filled 2019-08-02: qty 50

## 2019-08-02 MED ORDER — METOPROLOL TARTRATE 25 MG PO TABS: 25.0000 mg | ORAL_TABLET | Freq: Two times a day (BID) | ORAL | Status: DC

## 2019-08-02 NOTE — Progress Notes (Signed)
Spoke with Dr Roanna Banning about patient's current inpatient status due to Rapid a-fib diagnosis found yesterday at her PAT appointment. He states that her surgery needs to be done at [facility] due to her new a-fib diagnosis. Dr Sundra Aland office notified.

## 2019-08-02 NOTE — Progress Notes (Signed)
  Echocardiogram 2D Echocardiogram has been performed.  Jackie Parker 08/02/2019, 2:18 PM

## 2019-08-02 NOTE — Care Management Obs Status (Signed)
[place] NOTIFICATION   Patient Details  Name: Jackie Parker MRN: [id number] Date of Birth: [birth year]   Medicare Observation Status Notification Given:  Yes    Benard Halsted, LCSW 08/02/2019, 3:51 PM

## 2019-08-02 NOTE — Progress Notes (Signed)
Subjective:  No acute events overnight.  BP continued to be slightly soft overnight, 923R-007M systolic, but HR stable at 80-90, vitals otherwise stable.  She is feeling well this morning.  Denies any dyspnea currently although she has not ambulated yet.  No chest pain.  Objective:  Vital signs in last 24 hours: Vitals:   08/02/19 0500 08/02/19 0639 08/02/19 0811 08/02/19 0949  BP:   140/73 126/66  Pulse:   79 90  Resp:      Temp:  99 F (37.2 C) 98.3 F (36.8 C)   TempSrc:  Oral Oral   SpO2:   98%   Weight: 100.6 kg     Height:       Weight change:   Intake/Output Summary (Last 24 hours) at 08/02/2019 1414 Last data filed at 08/02/2019 1300 Gross per 24 hour  Intake 764 ml  Output 1650 ml  Net -886 ml    General: awake, alert, lying comfortably in bed in NAD Pulm: very mild end-expiratory wheezes in lower lung fields, improved from yesterday, normal work of breathing on room air CV: irregular rhythm, no murmurs, rubs or gallups Neuro: A&Ox3; no focal deficits Skin: warm and dry Psych: normal mood and affect     Assessment/Plan:  Principal Problem:   Atrial fibrillation with RVR ([facility]) Active Problems:   GOUT NOS   HYPOKALEMIA   GLAUCOMA NEC   Essential hypertension   Asthma   Postmenopausal bleeding   Dyspnea on exertion   Hypomagnesemia   78 y.o. 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  Atrial fibrillation with RVR: Still unclear trigger but potentially related to anemia or idiopathic/age-related.  Now rate-controlled on IV diltiazem.  Continues to be hemodynamically stable, no hypoxia.  Will transition from diltiazem to metoprolol. - Start po metoprolol  tartrate 64m BID this morning - Discontinue IV diltiazem 1 hour after starting metoprolol - Order echocardiogram today to evaluate for associated heart disease - CHADS-VASC score is 4 but given active bleeding we are holding anticoagulation until after D&C which is scheduled for 11/23.  Per ambulatory surgery nurse's note[facility] her new a-fib diagnosis, and GYN office is aware  Exertional dyspnea: May be due to Afib, anemia or asthma.  CHF has not been ruled out.  Less concern for ACS given troponins only mildly elevated and down-trending last night (93 -> 80) along with absence of chest pain or risk factors.  PE is also less likely given hemodynamic stability, low Well's score and now successful rate control on diltiazem. - Continue to monitor respiratory status - Echo will help evaluate for CHF  Chronic hypokalemia: Baseline K ~3-4.  Up from 2.3 to 3.2 after repletion yesterday - Oral KCl 40 mEq 3x/day for repletion - As below, consider restarting a different antihypertensive prior to discharge to avoid hypokalemia  Hypomagnesemia: Mg up from 1.4 to 1.7 after repletion yesterday - Replete Mg again today - Continue to trend and replete prn  Hypertension: BP has been relatively well-controlled and sometimes as low as the 1226Jsystolic on diltiazem and now metoprolol - Holding home hydrochlorothiazide for now, consider restarting a different antihypertensive prior to discharge   Asthma: Wheezing on exam was improved today.  May  need modification of her inhaler regimen if she continues to be dyspneic in spite of rate control and workup is unrevealing for other causes of dyspnea.   - Continue albuterol 1 puff q6hrs prn  Glaucoma:  - Continue home eye drops  Abnormal uterine bleeding: Hgb down from 11.2 -> 9.1 overnight.  Spoke with patient's GYN Dr Landry Mellow who recommended the following: - Increase Provera from 60m/day to 270mday -  Continue to monitor Hgb - If Hgb remains stable overnight, [facility] - If Hgb continues to drop will hold in hospital and make NPO on night of 11/22 in preparation for procedure  Gout: No flares recently - Continue Allopurinol daily   Dispo: Anticipated discharge in 1-2 days   LOS: 0 days   UnTonia GhentMedical Student 08/02/2019, 2:14 PM

## 2019-08-03 LAB — CBC
HCT: 30.4 % — ABNORMAL LOW (ref 36.0–46.0)
Hemoglobin: 9.9 g/dL — ABNORMAL LOW (ref 12.0–15.0)
MCH: 32.8 pg (ref 26.0–34.0)
MCHC: 32.6 g/dL (ref 30.0–36.0)
MCV: 100.7 fL — ABNORMAL HIGH (ref 80.0–100.0)
Platelets: 172 10*3/uL (ref 150–400)
RBC: 3.02 MIL/uL — ABNORMAL LOW (ref 3.87–5.11)
RDW: 13.3 % (ref 11.5–15.5)
WBC: 8.7 10*3/uL (ref 4.0–10.5)
nRBC: 0 % (ref 0.0–0.2)

## 2019-08-03 LAB — BASIC METABOLIC PANEL
Anion gap: 7 (ref 5–15)
BUN: 13 mg/dL (ref 8–23)
CO2: 23 mmol/L (ref 22–32)
Calcium: 8.3 mg/dL — ABNORMAL LOW (ref 8.9–10.3)
Chloride: 108 mmol/L (ref 98–111)
Creatinine, Ser: 1.9 mg/dL — ABNORMAL HIGH (ref 0.44–1.00)
GFR calc Af Amer: 29 mL/min — ABNORMAL LOW (ref >60)
GFR calc non Af Amer: 25 mL/min — ABNORMAL LOW (ref >60)
Glucose, Bld: 96 mg/dL (ref 70–99)
Potassium: 3.9 mmol/L (ref 3.5–5.1)
Sodium: 138 mmol/L (ref 135–145)

## 2019-08-03 LAB — URINE CULTURE: Culture: 70000 — AB

## 2019-08-03 LAB — MAGNESIUM: Magnesium: 2 mg/dL (ref 1.7–2.4)

## 2019-08-03 NOTE — Progress Notes (Signed)
OT Cancellation Note  Patient Details Name: Jackie Parker MRN: [id number] DOB: 1941-02-03   Cancelled Treatment:    Reason Eval/Treat Not Completed: Patient declined, no reason specified(Pt feels as though she does not require OT services.)  Pt adamantly refusing OT skilled services after education. Pt continued to refuse and get upset saying "I would not have come if I knew I would have to go through this much therapy again." Pt had just finished with PT and did not appear to be upset after working with them. Pt stating "I do not need your services. I am able to take care of myself and walk by myself." OT orders discontinued. Pt A/O x4.   Ebony Hail Harold Hedge) Marsa Aris OTR/L Acute Rehabilitation Services Pager: [phone] Office: [place] 08/03/2019, 4:20 PM

## 2019-08-03 NOTE — Progress Notes (Signed)
   Subjective: Reports bleeding has slowed down overnight. Discussed possible discharge and patient says she believes it would be hard for her to go home and come right back Monday morning. Denies SOB and chest pain.   Objective:  Vital signs in last 24 hours: Vitals:   08/02/19 1734 08/02/19 2015 08/03/19 0011 08/03/19 0516  BP: 110/77 (!) 107/53 110/63 108/71  Pulse: 71 66 (!) 58 61  Resp:  16 17 16   Temp: 98.7 F (37.1 C) 98.7 F (37.1 C)  98.6 F (37 C)  TempSrc: Oral Oral  Oral  SpO2: 98% 96%  96%  Weight:    101.4 kg  Height:       Weight change: 6.169 kg  Intake/Output Summary (Last 24 hours) at 08/03/2019 0865 Last data filed at 08/02/2019 1800 Gross per 24 hour  Intake 1751.93 ml  Output 1200 ml  Net 551.93 ml   Physical Exam  HQI:[id number] female , resting in bed Cardiovascular: Irregular rhythm, rate controlled. No murmurs , rubs, or gallops Abdomen: Normal bowel sound, non- tender  Assessment/Plan:  Principal Problem:   Atrial fibrillation with RVR ([place]) Active Problems:   GOUT NOS   HYPOKALEMIA   GLAUCOMA NEC   Essential hypertension   Asthma   Postmenopausal bleeding   Dyspnea on exertion   Hypomagnesemia   78 y.o. 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  Atrial fibrillation with RVR: Still unclear trigger but potentially related to anemia or idiopathic/age-related. Echo showed F 55-60% with left ventricular hypertrophy. ModerateMild calification and thickening of aortic valve and moderately elevated pulmonary artery systolic pressure note. Left atrial size note to be normal. Changes likely age related and due to longstanding hypertension. - CHADS-VASC  score is 4 but given active bleeding we are holding anticoagulation until after D&C which is scheduled for 11/23.  Discussed with Dr.Cole, OB/GYN,  patient scheduled at [facility] rather than outpatient due to her new a-fib diagnosis.  - Continue Metoprolol 77m BID  Abnormal uterine bleeding: Hgb intially trended down on admission 11.2 -> 9.1 overnight. Hgb stable overnight , 9.9.  Increased Provera 10 mg to 20 mg yesterday.  - Hgb stable, will continue to monitor Hgb - Plan for D&C on 11/23  - NPO at midnight Sunday  Exertional dyspnea:  - Likely due to Afib or overall deconditioning.  - PT/OT  Chronic hypokalemia: Baseline K ~3-4.  Up from 2.3 to 3.2 after repletion yesterday. 3.9 today - Oral KCl 40 mEq 3x/day for repletion - stop hydrochlorothiazide   Hypertension: BP has been relatively well-controlled on metoprolol - Holding home hydrochlorothiazide , in setting of controlled BP and chronic hypokalemia   Asthma:  - Continue albuterol 1 puff q6hrs prn  Glaucoma:  - Continue home eye drops  Gout: No flares recently - Continue Allopurinol daily   Dispo: Anticipated discharge with continued clinical improvement.   JTamsen Snider MD PGY1  [phone]

## 2019-08-03 NOTE — Evaluation (Signed)
Physical Therapy Evaluation Patient Details Name: Jackie Parker MRN: [id number] DOB: [birth year] Today's Date: 08/03/2019   History of Present Illness  78 yo female with onset of a-fib and SOB had procedure planned for uterine bleeding, sent by MD to hosp.  Replenished K+, reduced troponin value now, is Covid (-).  Now is referred to PT for mobility ck.  PMHx:  gout, glaucoma, HTN, asthma, B TKA's  Clinical Impression  Pt was up to walk with PT and note her comfort level of gait, but was not interested in going out of the room today.  Follow up with her to assess stairs if time allows, and encourage her to walk with staff as mutual time allows.  Follow acutely only for stair follow-up and then dc if pt has no further issues.      Follow Up Recommendations No PT follow up    Equipment Recommendations  None recommended by PT    Recommendations for Other Services       Precautions / Restrictions Precautions Precautions: Fall Precaution Comments: monitor telemetry Restrictions Weight Bearing Restrictions: No      Mobility  Bed Mobility Overal bed mobility: Modified Independent                Transfers Overall transfer level: Modified independent Equipment used: None                Ambulation/Gait Ambulation/Gait assistance: Supervision Gait Distance (Feet): 100 Feet Assistive device: None Gait Pattern/deviations: Step-through pattern;Narrow base of support Gait velocity: baseline possibly Gait velocity interpretation: <1.31 ft/sec, indicative of household ambulator General Gait Details: maneuvering around all obstacles in room  Stairs Stairs: (deferred as pt did not want to leave room)          Wheelchair Mobility    Modified Rankin (Stroke Patients Only)       Balance Overall balance assessment: Needs assistance Sitting-balance support: Feet supported Sitting balance-Leahy Scale: Good     Standing balance support: No upper extremity  supported Standing balance-Leahy Scale: Good Standing balance comment: fair with gait                             Pertinent Vitals/Pain Pain Assessment: No/denies pain    Home Living Family/patient expects to be discharged to:: Private residence Living Arrangements: Children Available Help at Discharge: Family Type of Home: House Home Access: Stairs to enter Entrance Stairs-Rails: Left Entrance Stairs-Number of Steps: 2 Home Layout: One level Home Equipment: None Additional Comments: was independent for all gait    Prior Function Level of Independence: Independent         Comments: lives with daughter but could do some housework and all her self care     Hand Dominance   Dominant Hand: Right    Extremity/Trunk Assessment   Upper Extremity Assessment Upper Extremity Assessment: Overall WFL for tasks assessed    Lower Extremity Assessment Lower Extremity Assessment: Overall WFL for tasks assessed    Cervical / Trunk Assessment Cervical / Trunk Assessment: Normal  Communication   Communication: No difficulties  Cognition Arousal/Alertness: Awake/alert Behavior During Therapy: WFL for tasks assessed/performed Overall Cognitive Status: Within Functional Limits for tasks assessed                                        General Comments      Exercises  Assessment/Plan    PT Assessment Patient needs continued PT services  PT Problem List Decreased range of motion;Decreased mobility;Cardiopulmonary status limiting activity       PT Treatment Interventions DME instruction;Gait training;Stair training;Functional mobility training;Therapeutic activities;Therapeutic exercise;Balance training;Neuromuscular re-education;Patient/family education    PT Goals (Current goals can be found in the Care Plan section)  Acute Rehab PT Goals Patient Stated Goal: to go home with daughter PT Goal Formulation: With patient Time For Goal  Achievement: 08/10/19 Potential to Achieve Goals: Good    Frequency Min 3X/week   Barriers to discharge Inaccessible home environment has two steps to enter house    Co-evaluation               AM-PAC PT "6 Clicks" Mobility  Outcome Measure Help needed turning from your back to your side while in a flat bed without using bedrails?: None Help needed moving from lying on your back to sitting on the side of a flat bed without using bedrails?: None Help needed moving to and from a bed to a chair (including a wheelchair)?: None Help needed standing up from a chair using your arms (e.g., wheelchair or bedside chair)?: A Little Help needed to walk in hospital room?: A Little Help needed climbing 3-5 steps with a railing? : A Little 6 Click Score: 21    End of Session Equipment Utilized During Treatment: Gait belt Activity Tolerance: Patient tolerated treatment well Patient left: in bed;with call bell/phone within reach;with nursing/sitter in room Nurse Communication: Mobility status PT Visit Diagnosis: Other abnormalities of gait and mobility (R26.89)    Time: 5789-7847 PT Time Calculation (min) (ACUTE ONLY): 28 min   Charges:   PT Evaluation $PT Eval Moderate Complexity: 1 Mod PT Treatments $Gait Training: 8-22 mins       Ramond Dial 08/03/2019, 3:17 PM   Mee Hives, PT MS Acute Rehab Dept. Number: [place] [facility] [place]

## 2019-08-04 ENCOUNTER — Other Ambulatory Visit: Payer: Self-pay | Admitting: Obstetrics and Gynecology

## 2019-08-04 DIAGNOSIS — M109 Gout, unspecified: Secondary | ICD-10-CM

## 2019-08-04 DIAGNOSIS — Z96653 Presence of artificial knee joint, bilateral: Secondary | ICD-10-CM

## 2019-08-04 DIAGNOSIS — I10 Essential (primary) hypertension: Secondary | ICD-10-CM

## 2019-08-04 DIAGNOSIS — Z9049 Acquired absence of other specified parts of digestive tract: Secondary | ICD-10-CM

## 2019-08-04 DIAGNOSIS — H409 Unspecified glaucoma: Secondary | ICD-10-CM

## 2019-08-04 DIAGNOSIS — M170 Bilateral primary osteoarthritis of knee: Secondary | ICD-10-CM

## 2019-08-04 DIAGNOSIS — K5190 Ulcerative colitis, unspecified, without complications: Secondary | ICD-10-CM

## 2019-08-04 DIAGNOSIS — J45909 Unspecified asthma, uncomplicated: Secondary | ICD-10-CM

## 2019-08-04 DIAGNOSIS — R0600 Dyspnea, unspecified: Secondary | ICD-10-CM

## 2019-08-04 DIAGNOSIS — Z79899 Other long term (current) drug therapy: Secondary | ICD-10-CM

## 2019-08-04 DIAGNOSIS — E876 Hypokalemia: Secondary | ICD-10-CM

## 2019-08-04 DIAGNOSIS — I4891 Unspecified atrial fibrillation: Principal | ICD-10-CM

## 2019-08-04 DIAGNOSIS — L409 Psoriasis, unspecified: Secondary | ICD-10-CM

## 2019-08-04 DIAGNOSIS — N939 Abnormal uterine and vaginal bleeding, unspecified: Secondary | ICD-10-CM

## 2019-08-04 LAB — CBC
HCT: 27.9 % — ABNORMAL LOW (ref 36.0–46.0)
HCT: 29.4 % — ABNORMAL LOW (ref 36.0–46.0)
Hemoglobin: 9.3 g/dL — ABNORMAL LOW (ref 12.0–15.0)
Hemoglobin: 9.6 g/dL — ABNORMAL LOW (ref 12.0–15.0)
MCH: 33.6 pg (ref 26.0–34.0)
MCH: 33.6 pg (ref 26.0–34.0)
MCHC: 33.3 g/dL (ref 30.0–36.0)
MCHC: 32.7 g/dL (ref 30.0–36.0)
MCV: 100.7 fL — ABNORMAL HIGH (ref 80.0–100.0)
MCV: 102.8 fL — ABNORMAL HIGH (ref 80.0–100.0)
Platelets: 166 10*3/uL (ref 150–400)
Platelets: 171 10*3/uL (ref 150–400)
RBC: 2.77 MIL/uL — ABNORMAL LOW (ref 3.87–5.11)
RBC: 2.86 MIL/uL — ABNORMAL LOW (ref 3.87–5.11)
RDW: 13.4 % (ref 11.5–15.5)
RDW: 13.4 % (ref 11.5–15.5)
WBC: 8.2 10*3/uL (ref 4.0–10.5)
WBC: 8.6 10*3/uL (ref 4.0–10.5)
nRBC: 0 % (ref 0.0–0.2)
nRBC: 0 % (ref 0.0–0.2)

## 2019-08-04 LAB — BASIC METABOLIC PANEL
Anion gap: 6 (ref 5–15)
Anion gap: 7 (ref 5–15)
BUN: 17 mg/dL (ref 8–23)
BUN: 18 mg/dL (ref 8–23)
CO2: 23 mmol/L (ref 22–32)
CO2: 20 mmol/L — ABNORMAL LOW (ref 22–32)
Calcium: 8.3 mg/dL — ABNORMAL LOW (ref 8.9–10.3)
Calcium: 8.4 mg/dL — ABNORMAL LOW (ref 8.9–10.3)
Chloride: 110 mmol/L (ref 98–111)
Chloride: 112 mmol/L — ABNORMAL HIGH (ref 98–111)
Creatinine, Ser: 1.97 mg/dL — ABNORMAL HIGH (ref 0.44–1.00)
Creatinine, Ser: 1.96 mg/dL — ABNORMAL HIGH (ref 0.44–1.00)
GFR calc Af Amer: 28 mL/min — ABNORMAL LOW (ref >60)
GFR calc Af Amer: 28 mL/min — ABNORMAL LOW (ref >60)
GFR calc non Af Amer: 24 mL/min — ABNORMAL LOW (ref >60)
GFR calc non Af Amer: 24 mL/min — ABNORMAL LOW (ref >60)
Glucose, Bld: 87 mg/dL (ref 70–99)
Glucose, Bld: 77 mg/dL (ref 70–99)
Potassium: 5.7 mmol/L — ABNORMAL HIGH (ref 3.5–5.1)
Potassium: 4.6 mmol/L (ref 3.5–5.1)
Sodium: 139 mmol/L (ref 135–145)
Sodium: 139 mmol/L (ref 135–145)

## 2019-08-04 LAB — MAGNESIUM: Magnesium: 1.9 mg/dL (ref 1.7–2.4)

## 2019-08-04 MED ORDER — FENTANYL CITRATE (PF) 100 MCG/2ML IJ SOLN: 50.0000 ug | INTRAMUSCULAR | Status: DC | PRN

## 2019-08-04 MED ORDER — MIDAZOLAM HCL 2 MG/2ML IJ SOLN: 1.0000 mg | INTRAMUSCULAR | Status: DC | PRN

## 2019-08-04 MED ORDER — LACTATED RINGERS IV SOLN: INTRAVENOUS | Status: DC

## 2019-08-04 MED ORDER — ACETAMINOPHEN 500 MG PO TABS
1000.0000 mg | ORAL_TABLET | ORAL | Status: AC
  Administered 2019-08-05: 1000 mg via ORAL
  Filled 2019-08-04: qty 2

## 2019-08-04 NOTE — Progress Notes (Signed)
   Subjective:  No acute events overnight.  Reports bleeding has been about the same overnight.  However she is still passing clots.  Denies SOB or chest pain.  Was able to ambulate yesterday without becoming dyspneic.   Objective:  Vital signs in last 24 hours: Vitals:   08/03/19 1942 08/03/19 2028 08/04/19 0458 08/04/19 0849  BP: (!) 112/59  125/75 100/79  Pulse: 89  89   Resp:   20   Temp:  98.1 F (36.7 C) 98.5 F (36.9 C)   TempSrc:  Oral Oral   SpO2:  100% 100%   Weight:   101.5 kg   Height:       Weight change: 0.091 kg  Intake/Output Summary (Last 24 hours) at 08/04/2019 1120 Last data filed at 08/03/2019 1946 Gross per 24 hour  Intake -  Output 350 ml  Net -350 ml    General: awake, alert, lying comfortably in bed in NAD Pulm: lungs clear to auscultation bilaterally, normal work of breathing on room air CV: irregular rhythm, rate-controlled, no murmurs, rubs or gallups GI: abdomen soft, nontender, nondistended Neuro: A&Ox3; no focal deficits Skin: warm and dry  Psych: normal mood and affect     Assessment/Plan:  Principal Problem:   Atrial fibrillation with RVR (HCC) Active Problems:   GOUT NOS   HYPOKALEMIA   GLAUCOMA NEC   Essential hypertension   Asthma   Postmenopausal bleeding   Dyspnea on exertion   Hypomagnesemia   78 y.o. 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  Atrial fibrillation with MQK:MMNOTR related to age/longstanding hypertension as other workup has been unrevealing including eho with normal EF, no significant valvular disease. - CHADS-VASC score is 4 but given active bleeding we are holding anticoagulation until after D&C which is  scheduled for tomorrow at [place].  - Continue Metoprolol 85m BID - Continue to monitor HR  Abnormal uterine bleeding:Hgb intially trended down on admission 11.2 -> 9.1 overnight but has since been stable at 9.9 -> 9.3.  Increased Provera 10 mg to 20 mg yesterday. Bleeding has stabilized but she continues to have active bleeding and is still passing clots. - Continue to monitor Hgb [facility]  She is staying here overnight and [facility] than [facility] and having as an outpatient, because of her new Afib and active bleeding with recent 2-point drop in Hgb - NPO at midnight tonight in preparation for procedure  Exertional dyspnea:  - Likely due to Afib or overall deconditioning.  - PT/OT  Chronic hypokalemia: K up from 3.9 to 4.6 this morning after further repletion (initial value of 5.7 this morning but sample was hemolyzed, normal K on recheck) - Hold oral KCl today given normal K this morning - Continue to hold hydrochlorothiazide   Hypertension:BP has been relatively well-controlled on metoprolol - Holding home hydrochlorothiazide in setting of controlled BP and chronic hypokalemia   Asthma: - Continue albuterol 1 puff q6hrs prn  Glaucoma: -Continue home eye drops  Gout:No flares recently -ContinueAllopurinoldaily   Dispo: Anticipated discharge tomorrow   LOS: 2 days   UTonia Ghent Medical Student 08/04/2019, 11:20 AM

## 2019-08-04 NOTE — Progress Notes (Signed)
2 IV nurses attempted for 18g piv as ordered.  Unable to cannulate suitable with ultrasound usage.  Pt. With current iv 22g started earlier today.

## 2019-08-04 NOTE — H&P (Signed)
Reason for Appointment  1. Postmenopausal bleeding    History of Present Illness  Isolation Precautions:  Respiratory Illness Screening 1. Is fever present / reported? No, 2. Are respiratory illness symptom(s) present / reported? No, 3. Are other symptom(s) present / reported? No, 5. Has there been reported travel to a High Risk respiratory illness region? No, 6. Has close* contact with person(s) known to have communicable illness been reported? No, 7. Did travel or close contact (if applicable) occur within 14 days of symptom onset? No.  General:  78 y/o presents to discuss management options for PMB Pt. states that she has been experiencing bleeding 2.5 weeks ago. She describes it as a normal menses. She mentions bleeding started 08/2018. She was previously being evaluated for her bleeding from previous physician.  She has a long history of postmenopausal bleeding which was treated with Mirena IUD x2 as well as Megetrol Acetate which caused SOB.  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 Hysterectomy was never discussed as an option for management for her bleeding.  On 05/15/2019 she was seen by Dr. Rip Harbour at faculty practice at [facility] where she was scheduled to have endometrial biopsy performed but she was experiencing SOB and planned on rescheduling appointment to another date. she was prescribed Megestrol Acetate twice a day which caused SOB, so patient discontinued medication . She denies trying anything like Provera or Northindrone in the past for management.  [facility] x 6.9 x 8.6 uterus. posterior submucosal fibroid was noted. The endometrium was thickened at 64m. Her ovaries appeared normal bilaterally. 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 She had an endometril biopsy performed on 07/12/2019, pathology was benign however this was a low cellularity specimen with abundant blood.  Pt  has been taking provera 10 mg since her last visit and states that the bleeding has slowed down significantly . she changes a pantiliner once a day.    Current Medications  Taking   Provera(medroxyPROGESTERone) 10 MG Tablet 1 tablet with food Orally Once a day   Hydrochlorothiazide-25 mg 25 MG Tablet 1 tablet in the morning Orally Once a day   Vitamin D 25 MCG (1000 UT) Tablet 1 tablet Orally Once a day   Potassium 1 tablet Orally Once a day   Medication List reviewed and reconciled with the patient    Past Medical History  Hypertension.   Colostomy.    Surgical History  back surgery x2   right knee replacement   left knee replacement    Family History  Father: deceased  Mother: deceased  3 son(s) , 2 daughter(s) .   denies any GYN family cancer hx.   Social History  General:  no Alcohol. Children: 3 boys, 2 girls. Tobacco use cigarettes: Never smoked, Tobacco history last updated 07/19/2019. Marital Status: single. no Recreational drug use. OCCUPATION: retired.    Gyn History  Sexual activity not currently sexually active.  Denies H/O Periods : postmenopausal.  LMP spotting started 2 weeks ago.  Denies H/O Birth control.  Last pap smear date not sure.  Last mammogram date 07/05/2018.  Denies H/O Abnormal pap smear.  Denies H/O STD.    OB History  Number of pregnancies 5.  Pregnancy # 1 boy, vaginal delivery, live birth.  Pregnancy # 2 girl, vaginal delivery, live birth.  Pregnancy # 3 girl, live birth, vaginal delivery.  Pregnancy # 4: boy ,  live birth, vaginal delivery.  Pregnancy # 5: Boy, Live Birth, vaginal delivery.    Allergies  Megestrol Acetate: SOB - Allergy   Hospitalization/Major Diagnostic Procedure  not in the past yr 07/2019   Review of Systems  Negative except as stated in HPI.     Vital Signs  Wt 223, Wt change 1 lb, Ht 62, BMI 40.78, Temp 97.5, Pulse sitting 135, BP sitting 138/74.   Examination  General Examination: CONSTITUTIONAL:  alert, oriented, NAD. SKIN: moist, warm. EYES: Conjunctiva clear. LUNGS: good I:E efffort noted. ABDOMEN: no guarding. EXTREMITIES: normal range of motion. NEUROLOGIC EXAM: alert and oriented x 3. PSYCH: affect normal.     Physical Examination  Pt aware of scribe services today.   Assessments   1. Postmenopausal bleeding - N95.0 (Primary)   2. Thickened endometrium - R93.89   Treatment  1. Postmenopausal bleeding  Notes: Although endometrial biopsy was benign there was low cellularity noted. Given her age and heavy bleeding I am concerned there could be an underlying malignancy. Recommend hysteroscopy D&C wit myosure. R/b/a of surgery discussed with patient including but not limited to infection bleedng possible perforation of the uterus with the need for further surgery.  She was referred to gyn oncology however per DR. Denman George they would not be able to take pt to surgery [facility]. Therefore plan for hysteroscopy D&C and myosure then referral to gyn oncology    2. Thickened endometrium  Notes: Endometrial biopsy was benign but low cellularity was noted. Plan hysteroscopy D&C for further evaluation.   3. Recent admission for atrial Fibrillation- followed by cardiology .

## 2019-08-05 ENCOUNTER — Encounter (HOSPITAL_COMMUNITY): Payer: Self-pay

## 2019-08-05 ENCOUNTER — Inpatient Hospital Stay (HOSPITAL_COMMUNITY): Payer: Medicare Other | Admitting: Anesthesiology

## 2019-08-05 ENCOUNTER — Encounter (HOSPITAL_COMMUNITY)
Admission: EM | Disposition: A | Discharge status: Home / Self Care | Payer: Self-pay | Source: Home / Self Care | Attending: Internal Medicine

## 2019-08-05 ENCOUNTER — Ambulatory Visit (HOSPITAL_BASED_OUTPATIENT_CLINIC_OR_DEPARTMENT_OTHER)
Admission: RE | Admit: 2019-08-05 | Payer: Medicare Other | Source: Home / Self Care | Admitting: Obstetrics and Gynecology

## 2019-08-05 ENCOUNTER — Other Ambulatory Visit: Payer: Self-pay | Admitting: Obstetrics and Gynecology

## 2019-08-05 DIAGNOSIS — N950 Postmenopausal bleeding: Secondary | ICD-10-CM | POA: Diagnosis not present

## 2019-08-05 HISTORY — PX: DILATATION & CURETTAGE/HYSTEROSCOPY WITH MYOSURE: SHX6511

## 2019-08-05 HISTORY — DX: Postmenopausal bleeding: N95.0

## 2019-08-05 LAB — CBC
HCT: 28.9 % — ABNORMAL LOW (ref 36.0–46.0)
HCT: 31.5 % — ABNORMAL LOW (ref 36.0–46.0)
Hemoglobin: 9.6 g/dL — ABNORMAL LOW (ref 12.0–15.0)
Hemoglobin: 10.4 g/dL — ABNORMAL LOW (ref 12.0–15.0)
MCH: 33.6 pg (ref 26.0–34.0)
MCH: 33.9 pg (ref 26.0–34.0)
MCHC: 33.2 g/dL (ref 30.0–36.0)
MCHC: 33 g/dL (ref 30.0–36.0)
MCV: 101 fL — ABNORMAL HIGH (ref 80.0–100.0)
MCV: 102.6 fL — ABNORMAL HIGH (ref 80.0–100.0)
Platelets: 167 10*3/uL (ref 150–400)
Platelets: 162 10*3/uL (ref 150–400)
RBC: 2.86 MIL/uL — ABNORMAL LOW (ref 3.87–5.11)
RBC: 3.07 MIL/uL — ABNORMAL LOW (ref 3.87–5.11)
RDW: 13.3 % (ref 11.5–15.5)
RDW: 13.4 % (ref 11.5–15.5)
WBC: 8.5 10*3/uL (ref 4.0–10.5)
WBC: 8.6 10*3/uL (ref 4.0–10.5)
nRBC: 0 % (ref 0.0–0.2)
nRBC: 0 % (ref 0.0–0.2)

## 2019-08-05 LAB — BASIC METABOLIC PANEL
Anion gap: 7 (ref 5–15)
BUN: 19 mg/dL (ref 8–23)
CO2: 20 mmol/L — ABNORMAL LOW (ref 22–32)
Calcium: 8.4 mg/dL — ABNORMAL LOW (ref 8.9–10.3)
Chloride: 111 mmol/L (ref 98–111)
Creatinine, Ser: 2.03 mg/dL — ABNORMAL HIGH (ref 0.44–1.00)
GFR calc Af Amer: 27 mL/min — ABNORMAL LOW (ref >60)
GFR calc non Af Amer: 23 mL/min — ABNORMAL LOW (ref >60)
Glucose, Bld: 79 mg/dL (ref 70–99)
Potassium: 5.2 mmol/L — ABNORMAL HIGH (ref 3.5–5.1)
Sodium: 138 mmol/L (ref 135–145)

## 2019-08-05 LAB — TYPE AND SCREEN
ABO/RH(D): A POS
Antibody Screen: NEGATIVE

## 2019-08-05 SURGERY — DILATATION & CURETTAGE/HYSTEROSCOPY WITH MYOSURE
Anesthesia: General

## 2019-08-05 MED ORDER — TRANEXAMIC ACID-NACL 1000-0.7 MG/100ML-% IV SOLN
INTRAVENOUS | Status: AC
  Filled 2019-08-05: qty 100

## 2019-08-05 MED ORDER — OXYCODONE HCL 5 MG PO TABS
5.0000 mg | ORAL_TABLET | ORAL | Status: DC | PRN
  Administered 2019-08-05 – 2019-08-06 (×3): 5 mg via ORAL
  Filled 2019-08-05 (×3): qty 1

## 2019-08-05 MED ORDER — PROPOFOL 10 MG/ML IV BOLUS
INTRAVENOUS | Status: DC | PRN
  Administered 2019-08-05: 150 mg via INTRAVENOUS
  Administered 2019-08-05: 30 mg via INTRAVENOUS

## 2019-08-05 MED ORDER — ONDANSETRON HCL 4 MG/2ML IJ SOLN: 4.0000 mg | Freq: Once | INTRAMUSCULAR | Status: DC | PRN

## 2019-08-05 MED ORDER — ROCURONIUM BROMIDE 10 MG/ML (PF) SYRINGE
PREFILLED_SYRINGE | INTRAVENOUS | Status: DC | PRN
  Administered 2019-08-05: 50 mg via INTRAVENOUS

## 2019-08-05 MED ORDER — ACETAMINOPHEN 10 MG/ML IV SOLN
INTRAVENOUS | Status: AC
  Filled 2019-08-05: qty 100

## 2019-08-05 MED ORDER — BUPIVACAINE HCL (PF) 0.25 % IJ SOLN
INTRAMUSCULAR | Status: AC
  Filled 2019-08-05: qty 30

## 2019-08-05 MED ORDER — ALBUMIN HUMAN 5 % IV SOLN
INTRAVENOUS | Status: DC | PRN
  Administered 2019-08-05 (×2): via INTRAVENOUS

## 2019-08-05 MED ORDER — PHENYLEPHRINE HCL-NACL 10-0.9 MG/250ML-% IV SOLN
INTRAVENOUS | Status: DC | PRN
  Administered 2019-08-05: 60 ug/min via INTRAVENOUS

## 2019-08-05 MED ORDER — ACETAMINOPHEN 10 MG/ML IV SOLN
1000.0000 mg | Freq: Once | INTRAVENOUS | Status: DC | PRN
  Administered 2019-08-05: 1000 mg via INTRAVENOUS

## 2019-08-05 MED ORDER — DEXAMETHASONE SODIUM PHOSPHATE 10 MG/ML IJ SOLN
INTRAMUSCULAR | Status: DC | PRN
  Administered 2019-08-05: 4 mg via INTRAVENOUS

## 2019-08-05 MED ORDER — TRANEXAMIC ACID 1000 MG/10ML IV SOLN
INTRAVENOUS | Status: DC | PRN
  Administered 2019-08-05: 1000 mg via INTRAVENOUS

## 2019-08-05 MED ORDER — SODIUM CHLORIDE 0.9 % IV SOLN
INTRAVENOUS | Status: DC
  Administered 2019-08-05: 12:00:00 via INTRAVENOUS

## 2019-08-05 MED ORDER — LIDOCAINE 2% (20 MG/ML) 5 ML SYRINGE
INTRAMUSCULAR | Status: DC | PRN
  Administered 2019-08-05: 100 mg via INTRAVENOUS

## 2019-08-05 MED ORDER — PHENYLEPHRINE 40 MCG/ML (10ML) SYRINGE FOR IV PUSH (FOR BLOOD PRESSURE SUPPORT)
PREFILLED_SYRINGE | INTRAVENOUS | Status: DC | PRN
  Administered 2019-08-05: 80 ug via INTRAVENOUS
  Administered 2019-08-05: 40 ug via INTRAVENOUS

## 2019-08-05 MED ORDER — ONDANSETRON HCL 4 MG/2ML IJ SOLN
INTRAMUSCULAR | Status: DC | PRN
  Administered 2019-08-05: 4 mg via INTRAVENOUS

## 2019-08-05 MED ORDER — FENTANYL CITRATE (PF) 100 MCG/2ML IJ SOLN
INTRAMUSCULAR | Status: DC | PRN
  Administered 2019-08-05: 50 ug via INTRAVENOUS

## 2019-08-05 MED ORDER — SODIUM CHLORIDE 0.9 % IR SOLN
Status: DC | PRN
  Administered 2019-08-05: 3000 mL

## 2019-08-05 MED ORDER — FENTANYL CITRATE (PF) 100 MCG/2ML IJ SOLN
INTRAMUSCULAR | Status: AC
  Filled 2019-08-05: qty 2

## 2019-08-05 MED ORDER — FENTANYL CITRATE (PF) 100 MCG/2ML IJ SOLN
25.0000 ug | INTRAMUSCULAR | Status: DC | PRN
  Administered 2019-08-05 (×2): 25 ug via INTRAVENOUS

## 2019-08-05 MED ORDER — FENTANYL CITRATE (PF) 250 MCG/5ML IJ SOLN
INTRAMUSCULAR | Status: AC
  Filled 2019-08-05: qty 5

## 2019-08-05 MED ORDER — PROPOFOL 10 MG/ML IV BOLUS
INTRAVENOUS | Status: AC
  Filled 2019-08-05: qty 20

## 2019-08-05 MED ORDER — SUGAMMADEX SODIUM 200 MG/2ML IV SOLN
INTRAVENOUS | Status: DC | PRN
  Administered 2019-08-05: 200 mg via INTRAVENOUS

## 2019-08-05 MED ORDER — BUPIVACAINE HCL (PF) 0.25 % IJ SOLN
INTRAMUSCULAR | Status: DC | PRN
  Administered 2019-08-05: 20 mL

## 2019-08-05 SURGICAL SUPPLY — 24 items
BAG URINE LEG 500ML (DRAIN) ×2 IMPLANT
CANISTER SUCT 3000ML PPV (MISCELLANEOUS) ×3 IMPLANT
CATH FOLEY 2WAY SLVR 18FR 30CC (CATHETERS) ×2 IMPLANT
CATH ROBINSON RED A/P 16FR (CATHETERS) ×3 IMPLANT
DEVICE MYOSURE LITE (MISCELLANEOUS) ×2 IMPLANT
DEVICE MYOSURE REACH (MISCELLANEOUS) IMPLANT
DILATOR CANAL MILEX (MISCELLANEOUS) IMPLANT
DRAPE UNDERBUTTOCKS STRL (DISPOSABLE) ×2 IMPLANT
GLOVE BIO SURGEON STRL SZ 6.5 (GLOVE) ×1 IMPLANT
GLOVE BIO SURGEONS STRL SZ 6.5 (GLOVE) ×1
GLOVE BIOGEL M 6.5 STRL (GLOVE) ×3 IMPLANT
GLOVE BIOGEL PI IND STRL 6.5 (GLOVE) ×1 IMPLANT
GLOVE BIOGEL PI IND STRL 7.0 (GLOVE) ×1 IMPLANT
GLOVE BIOGEL PI INDICATOR 6.5 (GLOVE) ×2
GLOVE BIOGEL PI INDICATOR 7.0 (GLOVE) ×4
GOWN STRL REUS W/ TWL LRG LVL3 (GOWN DISPOSABLE) ×2 IMPLANT
GOWN STRL REUS W/TWL LRG LVL3 (GOWN DISPOSABLE) ×6
KIT PROCEDURE FLUENT (KITS) ×3 IMPLANT
KIT TURNOVER KIT B (KITS) ×3 IMPLANT
PACK VAGINAL MINOR WOMEN LF (CUSTOM PROCEDURE TRAY) ×3 IMPLANT
PAD OB MATERNITY 4.3X12.25 (PERSONAL CARE ITEMS) ×3 IMPLANT
SEAL ROD LENS SCOPE MYOSURE (ABLATOR) ×3 IMPLANT
TOWEL GREEN STERILE FF (TOWEL DISPOSABLE) ×6 IMPLANT
UNDERPAD 30X36 HEAVY ABSORB (UNDERPADS AND DIAPERS) ×3 IMPLANT

## 2019-08-05 NOTE — Progress Notes (Signed)
Subjective:  No acute events overnight.  Vital signs stable.  She states she is still bleeding at the same pace.  Denies CP or SOB.  Has still been up and walking and states her DOE has improved significantly.  Objective:  Vital signs in last 24 hours: Vitals:   08/04/19 2010 08/04/19 2036 08/04/19 2042 08/05/19 0542  BP: 94/66 106/67 106/67 (!) 142/88  Pulse: 71 73  82  Resp: 16   16  Temp: 98.5 F (36.9 C)   98.7 F (37.1 C)  TempSrc: Oral   Oral  SpO2: 100%   100%  Weight:    102.2 kg  Height:       Weight change: 0.635 kg  Intake/Output Summary (Last 24 hours) at 08/05/2019 1037 Last data filed at 08/05/2019 9390 Gross per 24 hour  Intake -  Output 600 ml  Net -600 ml    General: awake, alert, lying comfortably in bed in NAD Pulm: lungs clear to auscultation bilaterally, normal work of breathing on room air CV: irregular rhythm, rate-controlled, no murmurs, rubs or gallups Neuro: A&Ox3; no focal deficits Skin: warm and dry  Psych: normal mood and affect    Assessment/Plan:  Principal Problem:   Atrial fibrillation with RVR (HCC) Active Problems:   GOUT NOS   HYPOKALEMIA   GLAUCOMA NEC   Essential hypertension   Asthma   Postmenopausal bleeding   Dyspnea on exertion   Hypomagnesemia   78 y.o.femalewith PMHx of hypertension, ulcerative colitis s/p total colectomy in 1977, 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  Atrial fibrillation with ZES:PQZRAQ related to age/longstanding hypertension as other workup has been unrevealing including eho with normal EF, no significant valvular disease. - CHADS-VASC score is 4 but given active bleeding we are holding anticoagulation until after D&C which is scheduled for today at [place].  - Continue  Metoprolol 31m BID - Continue to monitor HR - May discharge home on DOAC pending outcome of D&C; alternatively will follow-up outpatient to initiate this if more time is needed for hemostasis  Abnormal uterine bleeding:Hgb intially trended down on admission 11.2 ->9.1 overnight but has since been stable at 9.9 -> 9.3 -> 9.6 -> 9.6. Increased Provera 10 mg to 20 mg on 11/21. Bleeding has stabilized but she continues to have active bleeding. - [facility]- Further management per GYN and pending outcome of D&C today  Exertional dyspnea:  - Likely due to Afib as she states it has improved significantly since arrival - Has declined inpatient PT/OT services  Chronic hypokalemia: K up from 4.6 to 5.2 this morning in the setting of holding HCTZ and receiving TID repletion from 11/20-11/21, last received repletion yesterday morning - Hold further potassium repletion - Continue to hold hydrochlorothiazide as this was likely causing or contributing to her hypokalemia; consider discharging home on other antihypertensive such as amlodipine to avoid potassium derangements  Hypertension:BP has been relatively well-controlled on metoprolol alone but higher this morning at 142/88 - Consider discharging home on amlodipine as above  Asthma: - Continue albuterol 1 puff q6hrs prn  Glaucoma: -Continue home eye drops  Gout:No flares recently -ContinueAllopurinoldaily  Dispo: Anticipated discharge today or tomorrow pending outcome of D&C and GYN recommendations   LOS: 3 days   UTonia Ghent Medical Student 08/05/2019, 10:37 AM

## 2019-08-05 NOTE — Op Note (Signed)
08/05/2019  5:22 PM  PATIENT:  Jackie Parker  78 y.o. female  PRE-OPERATIVE DIAGNOSIS:  N95.0 postmenopausal bleeding  POST-OPERATIVE DIAGNOSIS:  N95.0 postmenopausal bleeding  PROCEDURE:  Procedure(s) with comments: DILATATION & CURETTAGE/HYSTEROSCOPY WITH MYOSURE (N/A) - Myosure rep will be here confirmed on 08/01/19 CS  SURGEON:  Surgeon(s) and Role:    Christophe Louis, MD - Primary  PHYSICIAN ASSISTANT: None  ASSISTANTS: none   ANESTHESIA: LMA  EBL:  500 mL   BLOOD ADMINISTERED:none  DRAINS: Uterine catheter placed    LOCAL MEDICATIONS USED:  MARCAINE     SPECIMEN:  Source of Specimen:  endometrial currettings   DISPOSITION OF SPECIMEN:  PATHOLOGY  COUNTS:  YES  TOURNIQUET:  * No tourniquets in log *  DICTATION: .Dragon Dictation  PLAN OF CARE: Admit to inpatient   PATIENT DISPOSITION:  PACU - hemodynamically stable.   Delay start of Pharmacological VTE agent (>24hrs) due to surgical blood loss or risk of bleeding: not applicable  Findings: normal external genitalia..polypoid proliferative appearing  endometrium with sychechia noted.  Procedure: Patient was taken to the operating room where she was placed under general anesthesia. She was placed in the dorsal lithotomy position. She was prepped and draped in the usual sterile fashion. A speculum was placed into the vaginal vault. The anterior lip of the cervix was grasped with a single-tooth tenaculum. Quarter percent Marcaine was injected at the 4 and 8:00 positions of the cervix. The cervix was then sounded to 8 cm. The cervix was dilated to approximately 6 mm. Diagnostic hysteroscope was inserted. The findings noted above.  Endometrial currettings were obtained with the myosure lite blade. The hysteroscope was removed. I intended to perform a sharp currettage however pt was noted to have copious amounts of bleeding from the cervix. A foley balloon was inserted into the cervical os and filled with 30 cc of  saline. The bleeding decreased significantly. Pt was also given 1 gram of TXA IV by anesthesia due to bleeding. A vaginal packing was placed.The single-tooth tenaculum was removed from the anterior lip of the cervix. She was awakened from anesthesia and  Taken to the recovery  room awake and in stable condition. Sponge lap and needle counts were correct x2.

## 2019-08-05 NOTE — Transfer of Care (Signed)
Immediate Anesthesia Transfer of Care Note  Patient: Jackie Parker  Procedure(s) Performed: DILATATION & CURETTAGE/HYSTEROSCOPY WITH MYOSURE (N/A )  Patient Location: PACU  Anesthesia Type:General  Level of Consciousness: awake, alert  and oriented  Airway & Oxygen Therapy: Patient Spontanous Breathing and Patient connected to face mask oxygen  Post-op Assessment: Report given to RN and Post -op Vital signs reviewed and stable  Post vital signs: Reviewed and stable  Last Vitals:  Vitals Value Taken Time  BP 111/57 08/05/19 1423  Temp    Pulse 76 08/05/19 1427  Resp 25 08/05/19 1427  SpO2 100 % 08/05/19 1427  Vitals shown include unvalidated device data.  Last Pain:  Vitals:   08/05/19 0542  TempSrc: Oral  PainSc:       Patients Stated Pain Goal: 0 (94/58/59 2924)  Complications: No apparent anesthesia complications

## 2019-08-05 NOTE — OR Nursing (Signed)
Vaginal Packing 5 yds  Foley with 30 ML bulb in place.

## 2019-08-05 NOTE — Anesthesia Procedure Notes (Addendum)
Procedure Name: Intubation Date/Time: 08/05/2019 1:13 PM Performed by: Marsa Aris, CRNA Pre-anesthesia Checklist: Patient identified, Emergency Drugs available, Suction available and Patient being monitored Patient Re-evaluated:Patient Re-evaluated prior to induction Oxygen Delivery Method: Circle System Utilized Preoxygenation: Pre-oxygenation with 100% oxygen Induction Type: IV induction Ventilation: Mask ventilation without difficulty Laryngoscope Size: Miller and 2 Grade View: Grade I Tube type: Oral Number of attempts: 1 Airway Equipment and Method: Stylet Placement Confirmation: ETT inserted through vocal cords under direct vision,  positive ETCO2 and breath sounds checked- equal and bilateral Secured at: 21 cm Tube secured with: Tape Dental Injury: Teeth and Oropharynx as per pre-operative assessment  Comments: Atraumatic ETT placement

## 2019-08-05 NOTE — Progress Notes (Signed)
PT Cancellation Note  Patient Details Name: Jackie Parker MRN: [id number] DOB: 12-25-1940   Cancelled Treatment:      Patient states "I've been doing PT for the past few years, I'm not doing it with you today and you'll never get me to do it while I'm here. I move around the room by myself just fine. Nothing was explained to me and if I knew you'd keep coming back I would never have done it in the first place". Removing patient from caseload per her expressive request. PT signing off.    Ann Lions PT, DPT, PN1   Supplemental Physical Therapist [place]    Pager [phone] Acute Rehab Office [phone]

## 2019-08-05 NOTE — Care Management Important Message (Signed)
Important Message  Patient Details  Name: Jackie Parker MRN: [id number] Date of Birth: Apr 02, 1941   Medicare Important Message Given:  Yes     Shelda Altes 08/05/2019, 12:21 PM

## 2019-08-05 NOTE — Anesthesia Postprocedure Evaluation (Signed)
Anesthesia Post Note  Patient: Jackie Parker  Procedure(s) Performed: DILATATION & CURETTAGE/HYSTEROSCOPY WITH MYOSURE (N/A )     Patient location during evaluation: PACU Anesthesia Type: General Level of consciousness: awake and alert Pain management: pain level controlled Vital Signs Assessment: post-procedure vital signs reviewed and stable Respiratory status: spontaneous breathing, nonlabored ventilation and respiratory function stable Cardiovascular status: blood pressure returned to baseline and stable Postop Assessment: no apparent nausea or vomiting Anesthetic complications: no    Last Vitals:  Vitals:   08/05/19 1507 08/05/19 1512  BP: 108/63 (!) 123/59  Pulse: 61 (!) 58  Resp: 18 17  Temp:    SpO2: 100% 100%    Last Pain:  Vitals:   08/05/19 0542  TempSrc: Oral  PainSc:                  Vergil Burby,W. EDMOND

## 2019-08-05 NOTE — H&P (Signed)
Date of Initial H&P:11/22/202 History reviewed, patient examined, no change in status, stable for surgery.

## 2019-08-05 NOTE — Anesthesia Preprocedure Evaluation (Addendum)
Anesthesia Evaluation  Patient identified by MRN, date of birth, ID band Patient awake    Reviewed: Allergy & Precautions, NPO status , Patient's Chart, lab work & pertinent test results  Airway Mallampati: III  TM Distance: >3 FB Neck ROM: Full    Dental  (+) Edentulous Upper, Edentulous Lower   Pulmonary asthma ,    Pulmonary exam normal breath sounds clear to auscultation       Cardiovascular hypertension, Pt. on medications Normal cardiovascular exam+ dysrhythmias Atrial Fibrillation  Rhythm:Regular Rate:Normal  ECHO: 1. Left ventricular ejection fraction, by visual estimation, is 55 to 60%. The left ventricle has normal function. There is mildly increased left ventricular hypertrophy. 2. The left ventricle has no regional wall motion abnormalities. 3. Global right ventricle has normal systolic function.The right ventricular size is normal. No increase in right ventricular wall thickness. 4. Left atrial size was normal. 5. Right atrial size was normal. 6. The mitral valve is normal in structure. Trace mitral valve regurgitation. No evidence of mitral stenosis. 7. The tricuspid valve is normal in structure. Tricuspid valve regurgitation is mild. 8. The aortic valve is normal in structure. Aortic valve regurgitation is not visualized. Mild aortic valve sclerosis without stenosis. 9. There is Mild calcification of the aortic valve. 10. There is Mild thickening of the aortic valve. 11. The pulmonic valve was grossly normal. Pulmonic valve regurgitation is not visualized. 12. Moderately elevated pulmonary artery systolic pressure. 13. The inferior vena cava is dilated in size with <50% respiratory variability, suggesting right atrial pressure of 15 mmHg.   Neuro/Psych Vertigo negative psych ROS   GI/Hepatic Neg liver ROS, PUD,   Endo/Other  Morbid obesity  Renal/GU Renal disease     Musculoskeletal Gout    Abdominal (+) + obese,   Peds  Hematology  (+) anemia ,   Anesthesia Other Findings postmenopausal bleeding  Reproductive/Obstetrics                           Anesthesia Physical Anesthesia Plan  ASA: III  Anesthesia Plan: General   Post-op Pain Management:    Induction:   PONV Risk Score and Plan: 3 and Ondansetron, Dexamethasone and Treatment may vary due to age or medical condition  Airway Management Planned: LMA  Additional Equipment:   Intra-op Plan:   Post-operative Plan: Extubation in OR  Informed Consent: I have reviewed the patients History and Physical, chart, labs and discussed the procedure including the risks, benefits and alternatives for the proposed anesthesia with the patient or authorized representative who has indicated his/her understanding and acceptance.     Dental advisory given  Plan Discussed with: CRNA  Anesthesia Plan Comments:        Anesthesia Quick Evaluation

## 2019-08-05 NOTE — Progress Notes (Addendum)
Spoke with Dr Landry Mellow. Patient had bleeding during hysteroscopy estimated blood loss of 360cc, she was able to tamponade bleeding with packing and foley catherter and treatmetn with TXA.  She recommends overnight monitoring.  Her full op note is to follow.  Will have her return to cardiac monitored bed. Repeat CBC at 4pm and again tomorrow AM.  Type and Screen with 4pm blood draw.  Lucious Groves, DO   ADDENDUM: Also uterine foley/packing is not to be removed until Dr Landry Mellow reevaluates tomorrow.

## 2019-08-06 ENCOUNTER — Other Ambulatory Visit: Payer: Self-pay | Admitting: Internal Medicine

## 2019-08-06 ENCOUNTER — Encounter (HOSPITAL_COMMUNITY): Payer: Self-pay | Admitting: Obstetrics and Gynecology

## 2019-08-06 LAB — COMPREHENSIVE METABOLIC PANEL
ALT: 16 U/L (ref 0–44)
AST: 34 U/L (ref 15–41)
Albumin: 2.6 g/dL — ABNORMAL LOW (ref 3.5–5.0)
Alkaline Phosphatase: 53 U/L (ref 38–126)
Anion gap: 8 (ref 5–15)
BUN: 22 mg/dL (ref 8–23)
CO2: 19 mmol/L — ABNORMAL LOW (ref 22–32)
Calcium: 8.5 mg/dL — ABNORMAL LOW (ref 8.9–10.3)
Chloride: 110 mmol/L (ref 98–111)
Creatinine, Ser: 2.07 mg/dL — ABNORMAL HIGH (ref 0.44–1.00)
GFR calc Af Amer: 26 mL/min — ABNORMAL LOW (ref >60)
GFR calc non Af Amer: 22 mL/min — ABNORMAL LOW (ref >60)
Glucose, Bld: 110 mg/dL — ABNORMAL HIGH (ref 70–99)
Potassium: 5.2 mmol/L — ABNORMAL HIGH (ref 3.5–5.1)
Sodium: 137 mmol/L (ref 135–145)
Total Bilirubin: 1.2 mg/dL (ref 0.3–1.2)
Total Protein: 6.8 g/dL (ref 6.5–8.1)

## 2019-08-06 LAB — SURGICAL PATHOLOGY

## 2019-08-06 LAB — CBC
HCT: 26.4 % — ABNORMAL LOW (ref 36.0–46.0)
HCT: 26.6 % — ABNORMAL LOW (ref 36.0–46.0)
Hemoglobin: 8.7 g/dL — ABNORMAL LOW (ref 12.0–15.0)
Hemoglobin: 8.6 g/dL — ABNORMAL LOW (ref 12.0–15.0)
MCH: 33.7 pg (ref 26.0–34.0)
MCH: 34.1 pg — ABNORMAL HIGH (ref 26.0–34.0)
MCHC: 33 g/dL (ref 30.0–36.0)
MCHC: 32.3 g/dL (ref 30.0–36.0)
MCV: 102.3 fL — ABNORMAL HIGH (ref 80.0–100.0)
MCV: 105.6 fL — ABNORMAL HIGH (ref 80.0–100.0)
Platelets: 163 10*3/uL (ref 150–400)
Platelets: 171 10*3/uL (ref 150–400)
RBC: 2.58 MIL/uL — ABNORMAL LOW (ref 3.87–5.11)
RBC: 2.52 MIL/uL — ABNORMAL LOW (ref 3.87–5.11)
RDW: 13.4 % (ref 11.5–15.5)
RDW: 13.6 % (ref 11.5–15.5)
WBC: 12.3 10*3/uL — ABNORMAL HIGH (ref 4.0–10.5)
WBC: 18.8 10*3/uL — ABNORMAL HIGH (ref 4.0–10.5)
nRBC: 0 % (ref 0.0–0.2)
nRBC: 0 % (ref 0.0–0.2)

## 2019-08-06 MED ORDER — MEDROXYPROGESTERONE ACETATE 10 MG PO TABS: 20.0000 mg | ORAL_TABLET | Freq: Every day | ORAL | 0 refills | Status: DC

## 2019-08-06 MED ORDER — METOPROLOL TARTRATE 25 MG PO TABS: 25.0000 mg | ORAL_TABLET | Freq: Two times a day (BID) | ORAL | 0 refills | Status: DC

## 2019-08-06 NOTE — Discharge Summary (Signed)
Name: Jackie Parker MRN: [id number] DOB: 03-15-1941 78 y.o. PCP: [name], MD  Date of Admission: 08/01/2019 10:47 AM Date of Discharge: 08/06/2019 Attending Physician: Lucious Groves, DO  Discharge Diagnosis: 1. Atrial Fibrillation with RVR 2.  Abnormal uterine bleeding  Discharge Medications: Allergies as of 08/06/2019      Reactions   Sulfonamide Derivatives Rash      Medication List    STOP taking these medications   hydrochlorothiazide 25 MG tablet Commonly known as: HYDRODIURIL   potassium chloride 10 MEQ tablet Commonly known as: KLOR-CON     TAKE these medications   albuterol 108 (90 Base) MCG/ACT inhaler Commonly known as: ProAir HFA INHALE 2 PUFFS INTO THE LUNGS EVERY 6 HOURS AS NEEDED FOR WHEEZING OR SHORTNESS OF BREATH   allopurinol 100 MG tablet Commonly known as: ZYLOPRIM TAKE 1 TABLET(100 MG) BY MOUTH DAILY What changed: See the new instructions.   brimonidine 0.2 % ophthalmic solution Commonly known as: ALPHAGAN Place 1 drop into the right eye 3 (three) times daily.   brinzolamide 1 % ophthalmic suspension Commonly known as: AZOPT Place 1 drop into the right eye 2 (two) times daily.   cetirizine 10 MG tablet Commonly known as: ZYRTEC TAKE 1 TABLET(10 MG) BY MOUTH DAILY What changed: See the new instructions.   cholecalciferol 1000 units tablet Commonly known as: VITAMIN D Take 1,000 Units by mouth daily.   colchicine 0.6 MG tablet Take 2 tabs (1.2 mg) at the onset of a gout attack, may repeat 1 tab (0.6 mg) 2 hours later if symptoms persist.   fluocinonide ointment 0.05 % Commonly known as: LIDEX Apply 1 application topically 2 (two) times daily as needed for itching.   fluticasone 110 MCG/ACT inhaler Commonly known as: Flovent HFA Inhale 2 puffs into the lungs 2 (two) times daily.   ibuprofen 200 MG tablet Commonly known as: ADVIL Take 400 mg by mouth every 6 (six) hours as needed for headache or moderate pain.    Lumigan 0.01 % Soln Generic drug: bimatoprost Place 1 drop into both eyes at bedtime.   medroxyPROGESTERone 10 MG tablet Commonly known as: PROVERA Take 2 tablets (20 mg total) by mouth daily. Start taking on: August 07, 2019 What changed: how much to take   metoprolol tartrate 25 MG tablet Commonly known as: LOPRESSOR Take 1 tablet (25 mg total) by mouth 2 (two) times daily.   mirabegron ER 25 MG Tb24 tablet Commonly known as: Myrbetriq Take 1 tablet (25 mg total) by mouth daily.   prochlorperazine 10 MG tablet Commonly known as: COMPAZINE TAKE 1 TABLET(10 MG) BY MOUTH TWICE DAILY AS NEEDED FOR NAUSEA OR VOMITING What changed: See the new instructions.       Disposition and follow-up:   Jackie Parker was discharged from [facility] in Stable condition.  At the hospital follow up visit please address:  1.    Atrial fibrillation with RVR CHA2DS2-VASc score is 4 but given active bleeding we held anticoagulation -Consider starting anticoagulation , may need further discussion with Dr. Christophe Louis, OB/GYN, she is managing abnormal uterine bleeding, see hospital course for more details. -Continue metoprolol tartrate 25 mg twice daily -Consider referring to Dr.Allred's Afib Clinic  Abnormal uterine bleeding D&C on 11/23.  Plans for surgery with Gyn Onc in December.  -Continue Provera 20 mg - CBC  Chronic hypokalemia Held HCTZ and repleted potassium. -BMP  Hypertension Stopped HCTZ.  Started metoprolol tartrate as above. -Continue to be titrated in  outpatient as needed  2.  Labs / imaging needed at time of follow-up: CBC, BMP  3.  Pending labs/ test needing follow-up:  Follow-up Appointments: Follow-up Information    [name], MD Follow up in 1 week(s).   Specialty: Family Medicine Contact information: [place] [address] [phone]        Christophe Louis, MD. Call.   Specialty: Obstetrics and  Gynecology Why: Call to schedule appointment for 1-2 weeks with Dr.Cole.  Contact information: [address] [place] [address] [phone]        Thompson Grayer, MD Follow up.   Specialty: Cardiology Why: Call to make an appointment as a new patient with recent diagnosis of Atrial Fibrillation.  Contact information: [place] [address] [facility] Course by problem list: 1.   #Atrial fibrillation with HWT:UUEKCM related to age/longstanding hypertension as workup was unrevealing including echowith normal EF, no significant valvular disease. CHADS-VASC score is 4 but given active bleeding we held anticoagulation. Rate controlled on Metoprolol 25 mg BID.   #Abnormal uterine bleeding:Hgb intially trended down on admission 11.2  -->9.1 , stable after increasing Provera 10 mg to 20 mg on 11/21.D&C on 11/23 with 360 cc blood loss, able to tamponade with foley catheter , packing and TXA. Hgb up to 10.4 after surgery, expect this was an error. Next day, OB/Gyn removed packing and will have follow up with patient. Hgb stable, 8.7 on morning of discharge and 8.6 in the afternoon.   #Chronic hypokalemia Hypokalemic on admission. Improved with stopping HCTZ and repletion of potassium.   #HTN Relatively well controlled after starting metoprolol.  #Glaucoma #Gout #Asthma - Continued home medications.  Discharge Vitals:   BP (!) 101/56 (BP Location: Left Arm)   Pulse 75   Temp 98 F (36.7 C) (Oral)   Resp 16   Ht 5' 2"  (1.575 m)   Wt 103.2 kg   SpO2 100%   BMI 41.63 kg/m   Pertinent Labs, Studies, and Procedures:  CBC Latest Ref Rng & Units 08/06/2019 08/06/2019 08/05/2019  WBC 4.0 - 10.5 K/uL 18.8(H) 12.3(H) 8.6  Hemoglobin 12.0 - 15.0 g/dL 8.6(L) 8.7(L) 10.4(L)  Hematocrit 36.0 - 46.0 % 26.6(L) 26.4(L) 31.5(L)  Platelets 150 - 400 K/uL 171 163 162   BMP Latest Ref Rng & Units 08/06/2019 08/05/2019  08/04/2019  Glucose 70 - 99 mg/dL 110(H) 79 77  BUN 8 - 23 mg/dL 22 19 18   Creatinine 0.44 - 1.00 mg/dL 2.07(H) 2.03(H) 1.96(H)  BUN/Creat Ratio 12 - 28 - - -  Sodium 135 - 145 mmol/L 137 138 139  Potassium 3.5 - 5.1 mmol/L 5.2(H) 5.2(H) 4.6  Chloride 98 - 111 mmol/L 110 111 112(H)  CO2 22 - 32 mmol/L 19(L) 20(L) 20(L)  Calcium 8.9 - 10.3 mg/dL 8.5(L) 8.4(L) 8.4(L)   Echo on 11/20  Sonographer Comments: Technically difficult study due to poor echo windows and patient is morbidly obese. Image acquisition challenging due to patient body habitus. IMPRESSIONS    1. Left ventricular ejection fraction, by visual estimation, is 55 to 60%. The left ventricle has normal function. There is mildly increased left ventricular hypertrophy.  2. The left ventricle has no regional wall motion abnormalities.  3. Global right ventricle has normal systolic function.The right ventricular size is normal. No increase in right ventricular wall thickness.  4. Left atrial size was normal.  5.  Right atrial size was normal.  6. The mitral valve is normal in structure. Trace mitral valve regurgitation. No evidence of mitral stenosis.  7. The tricuspid valve is normal in structure. Tricuspid valve regurgitation is mild.  8. The aortic valve is normal in structure. Aortic valve regurgitation is not visualized. Mild aortic valve sclerosis without stenosis.  9. There is Mild calcification of the aortic valve. 10. There is Mild thickening of the aortic valve. 11. The pulmonic valve was grossly normal. Pulmonic valve regurgitation is not visualized. 12. Moderately elevated pulmonary artery systolic pressure. 13. The inferior vena cava is dilated in size with <50% respiratory variability, suggesting right atrial pressure of 15 mmHg.  FINDINGS  Left Ventricle: Left ventricular ejection fraction, by visual estimation, is 55 to 60%. The left ventricle has normal function. The left ventricle has no regional wall motion  abnormalities. There is mildly increased left ventricular hypertrophy.  Concentric left ventricular hypertrophy. Left ventricular diastolic parameters were normal. Normal left atrial pressure.  Right Ventricle: The right ventricular size is normal. No increase in right ventricular wall thickness. Global RV systolic function is has normal systolic function. The tricuspid regurgitant velocity is 2.80 m/s, and with an assumed right atrial pressure  of 15 mmHg, the estimated right ventricular systolic pressure is moderately elevated at 46.4 mmHg.  Left Atrium: Left atrial size was normal in size.  Right Atrium: Right atrial size was normal in size  Pericardium: There is no evidence of pericardial effusion.  Mitral Valve: The mitral valve is normal in structure. No evidence of mitral valve stenosis by observation. Trace mitral valve regurgitation.  Tricuspid Valve: The tricuspid valve is normal in structure. Tricuspid valve regurgitation is mild.  Aortic Valve: The aortic valve is normal in structure.. There is Mild thickening and Mild calcification of the aortic valve. Aortic valve regurgitation is not visualized. Mild aortic valve sclerosis is present, with no evidence of aortic valve stenosis.  There is Mild thickening of the aortic valve. Mild calcification.  Pulmonic Valve: The pulmonic valve was grossly normal. Pulmonic valve regurgitation is not visualized.  Aorta: The aortic root and ascending aorta are structurally normal, with no evidence of dilitation.  Pulmonary Artery: The pulmonary artery is not well seen.  Venous: The inferior vena cava is dilated in size with less than 50% respiratory variability, suggesting right atrial pressure of 15 mmHg.  IAS/Shunts: No atrial level shunt detected by color flow Doppler. No ventricular septal defect is seen or detected. There is no evidence of an atrial septal defect.     LEFT VENTRICLE PLAX 2D LVIDd:         4.38 cm        Diastology LVIDs:         2.78 cm       LV e' lateral:   8.59 cm/s LV PW:         1.29 cm       LV E/e' lateral: 11.3 LV IVS:        1.35 cm       LV e' medial:    7.62 cm/s LVOT diam:     1.90 cm       LV E/e' medial:  12.7 LV SV:         58 ml LV SV Index:   26.81 LVOT Area:     2.84 cm   LV Volumes (MOD) LV area d, A2C:    24.80 cm LV area d, A4C:    25.90  cm LV area s, A2C:    13.80 cm LV area s, A4C:    15.20 cm LV major d, A2C:   6.57 cm LV major d, A4C:   6.87 cm LV major s, A2C:   5.26 cm LV major s, A4C:   5.39 cm LV vol d, MOD A2C: 74.1 ml LV vol d, MOD A4C: 80.1 ml LV vol s, MOD A2C: 31.1 ml LV vol s, MOD A4C: 34.7 ml LV SV MOD A2C:     43.0 ml LV SV MOD A4C:     80.1 ml LV SV MOD BP:      45.9 ml  RIGHT VENTRICLE             IVC RV S prime:     11.60 cm/s  IVC diam: 2.66 cm TAPSE (M-mode): 2.6 cm  LEFT ATRIUM             Index       RIGHT ATRIUM           Index LA diam:        4.20 cm 2.10 cm/m  RA Area:     14.40 cm LA Vol (A2C):   52.7 ml 26.38 ml/m RA Volume:   34.10 ml  17.07 ml/m LA Vol (A4C):   40.3 ml 20.17 ml/m LA Biplane Vol: 46.4 ml 23.23 ml/m  AORTIC VALVE LVOT Vmax:   139.00 cm/s LVOT Vmean:  94.600 cm/s LVOT VTI:    0.273 m   AORTA Ao Root diam: 2.70 cm Ao Asc diam:  3.00 cm  MITRAL VALVE                        TRICUSPID VALVE MV Area (PHT): 2.78 cm             TR Peak grad:   31.4 mmHg MV PHT:        79.17 msec           TR Vmax:        280.00 cm/s MV Decel Time: 273 msec MV E velocity: 96.75 cm/s 103 cm/s  SHUNTS MV A velocity: 52.00 cm/s 70.3 cm/s Systemic VTI:  0.27 m MV E/A ratio:  1.86       1.5       Systemic Diam: 1.90 cm   Discharge Instructions: Discharge Instructions    Call MD for:   Complete by: As directed    Diet - low sodium heart healthy   Complete by: As directed    Increase activity slowly   Complete by: As directed       Signed:   Tamsen Snider, MD PGY1  [phone]

## 2019-08-06 NOTE — Progress Notes (Signed)
1 Day Post-Op Procedure(s) (LRB): DILATATION & CURETTAGE/HYSTEROSCOPY WITH MYOSURE (N/A)  Subjective: Patient reports tolerating PO and no problems voiding.   Pt seen this morning at 830 am.. states " I feel Great" ... Vaginal packing was removed and was saturated. Foley bulb deflated 15 cc but left in placed. I returned at 1230 pm and removed the additional 15 cc. Pad from this morning with minimal bleeding noted. Called to speak with [facility] patients nurse at 245 pm and pad with saturation are of 50 cent piece over 2 hours. No heavy bleeding at this time.  Objective: I have reviewed patient's vital signs, intake and output, medications and labs.  General: alert, cooperative and no distress GI: soft nontender ostomy noted  Vaginal Bleeding: minimal  Assessment: s/p Procedure(s) with comments: DILATATION & CURETTAGE/HYSTEROSCOPY WITH MYOSURE (N/A) - Myosure rep will be here confirmed on 08/01/19 CS: stable  Plan: PMB- ok to d/c home from gyn perspective. Pt to continue provera 20 mg daily.... given active bleeding I do not recommend starting anticoagulation for Afib.  Appt in my office in 1-2 wks. For postoperative visit.   Afib- followed by internal medicine    LOS: 4 days    Jackie Parker 08/06/2019, 2:52 PM  [facility]  Office [phone] Cell [phone]

## 2019-08-06 NOTE — Progress Notes (Signed)
Pt has about a 50 cent piece area of blood on sanitary pad, slightly saturated in the pad. Notified Dr. Landry Mellow. Stated ok to d/c. Stated she will let Dr. Court Joy know. Carroll Kinds RN

## 2019-08-06 NOTE — Discharge Instructions (Addendum)
Jackie Parker,  Thank you you for trusting [place] with your care.  You were treated for the following:  Uterine bleeding - Please take Provera 20 mg daily -Call Dr. Sundra Aland office and make an appointment to be seen in 1 to 2 weeks per her instructions  Atrial fibrillation - We started you on a medication called Metoprolol Tartrate 25 mg BID , which slows her heart rate down. This is part of the treatment for atrial fibrillation.  -Another part of the treatment of A. fib is being on anticoagulation. Your risk of bleeding and having adverse event  Is greater than your risk of having a clot today. Soon your doctor may want to start you on another medication when we are sure your risk of bleeding is less.  -I have included the number to Dr.Allred's At[facility]ibrillation [facility]. He is with [facility].   Low potassium - You came in with low potassium and we stopped your medication hydrochlorothiazide (HCTZ). This medication was for high blood pressure, but can cause this side effect. After stopping it your potassium stayed normal , and you should not need your potassium supplement. I will recommend your PCP check this at your visit  In 1-2 weeks. Metoprolol should control your blood pressure and if not your PCP can choose to add another medication.   My best,  Tamsen Snider, MD

## 2019-08-06 NOTE — Progress Notes (Signed)
    Subjective: Vital signs stable overnight and patient reports she feels good. Dr.Cole to evaluate her today. Patient would like referral to cardiology as an outpatient. Told patient we will give her the number to Afib clinic and also put suggestion in for referral.   Objective:  Vital signs in last 24 hours: Vitals:   08/05/19 2111 08/06/19 0434 08/06/19 0900 08/06/19 1200  BP: 140/80 (!) 154/81 (!) 111/57 115/63  Pulse: 73 86    Resp:      Temp:  98.1 F (36.7 C)    TempSrc:  Oral    SpO2:  100%    Weight:  103.2 kg    Height:       Weight change: 1.089 kg  Intake/Output Summary (Last 24 hours) at 08/06/2019 1253 Last data filed at 08/06/2019 1200 Gross per 24 hour  Intake 1120 ml  Output 1100 ml  Net 20 ml    General: awake, alert, lying comfortably in bed in NAD Pulm: lungs clear to auscultation bilaterally, normal work of breathing on room air CV: irregular rhythm, rate-controlled, no murmurs, rubs or gallups Neuro: A&Ox3; no focal deficits Skin: warm and dry  Psych: normal mood and affect    Assessment/Plan:  Principal Problem:   Atrial fibrillation with RVR (HCC) Active Problems:   GOUT NOS   HYPOKALEMIA   GLAUCOMA NEC   Essential hypertension   Asthma   Postmenopausal bleeding   Dyspnea on exertion   Hypomagnesemia   78 y.o.femalewith PMHx of hypertension, ulcerative colitis s/p total colectomy in 1977, 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  Atrial fibrillation with ZOX:WRUEAV related to age/longstanding hypertension as other workup has been unrevealing including eho with normal EF, no significant valvular disease. - CHADS-VASC score is 4 but given active bleeding we are holding anticoagulation . Significant bleeding with D&C and  being managed by Ob/Gyn.  - Continue Metoprolol 39m BID - Continue to monitor HR  Abnormal uterine bleeding:Hgb intially trended down on admission 11.2 ->9.1 , stable after increasing Provera 10 mg to 20 mg on 11/21. D&C on 11/23. Hgb up to 10.4 after surgery, expect this was an error. Hgb 8.7 this morning.  - OB/GYN following, plan to remove packing today - CBC this afternoon  Exertional dyspnea:  - Likely due to Afib as she states it has improved significantly since arrival - Has declined inpatient PT/OT services  Chronic hypokalemia: : Hyperkalemia has resolved with holding HCTZ and repletion of potassium.  Mildly elevated to 5.2 - Hold further potassium repletion - Continue to hold hydrochlorothiazide as this was likely causing or contributing to her hypokalemia  Hypertension:BP has been relatively well-controlled on metoprolol.  We will continue to monitor  Asthma: - Continue albuterol 1 puff q6hrs prn  Glaucoma: -Continue home eye drops  Gout:No flares recently -ContinueAllopurinoldaily  Dispo: Anticipated discharge in 1-2 days.   JTamsen Snider MD PGY1  [phone]

## 2019-08-13 ENCOUNTER — Other Ambulatory Visit: Payer: Self-pay | Admitting: *Deleted

## 2019-08-13 DIAGNOSIS — Z933 Colostomy status: Secondary | ICD-10-CM | POA: Diagnosis not present

## 2019-08-13 NOTE — Patient Outreach (Signed)
[facility]) Care Management  08/13/2019  Jackie Parker 09-17-1940 [id number]   Subjective: Telephone call to patient's home  / mobile number, no answer, left HIPAA compliant voicemail message, and requested call back.    Objective: Per KPN (Knowledge Performance Now, point of care tool) and chart review, patient hospitalized 08/01/2019 - 08/06/2019 for Atrial fibrillation with rapid ventricular response,  Abnormal uterine bleeding, status post DILATATION & CURETTAGE/HYSTEROSCOPY WITH MYOSURE  On 08/05/2019.   Patient also has a history of hypertension, ulcerative colitis status post total colectomy in 1977, glaucoma, bilateral knee osteoarthritis status post bilateral total knee replacement (left in 12/2015, right in 06/2016, postmenopausal bleeding, psoriasis, asthma, Vitamin D deficiency, and gout.     Assessment: Received [facility] EMMI General Discharge Red Flag Alert follow up referral on 08/12/2019 and 08/13/2019.  Red Flag Alert Trigger, Day #1, Day #4, patient answered no to the following question: Scheduled follow-up?   [facility] EMMI follow up pending patient contact.      Plan: RNCM will send unsuccessful outreach letter, [facility] pamphlet, handout: Know Before You Go, will call patient for 2nd telephone outreach attempt within 4 business days, [facility] EMMI follow up, and proceed with case closure, within 10 business days if no return call.      Noralee Dutko H. Annia Friendly, BSN, [place] Management [facility] Telephonic CM Phone: [phone] Fax: [phone]

## 2019-08-14 ENCOUNTER — Other Ambulatory Visit: Payer: Self-pay | Admitting: *Deleted

## 2019-08-14 ENCOUNTER — Encounter: Payer: Self-pay | Admitting: *Deleted

## 2019-08-14 NOTE — Patient Outreach (Addendum)
[facility]) Care Management  08/14/2019  Jackie Parker 03-May-1941 [id number]   Subjective: Telephone call to patient's home / mobile number, spoke with patient, and HIPAA verified.  Discussed [place] Medicare  EMMI General Discharge Red Flag Alert follow up, patient voiced understanding, and is in agreement to follow up.  Patient states she is doing okay, remembers receiving EMMI automated calls, and system captured the wrong answer.   States she has scheduled follow up appointment on 09/11/2019 with cardiologist and on 08/19/2019 with OB/GYN MD.   Discussed importance of hospital follow up with primary MD, patient voices understanding, she will call primary MD to notify of hospitalization, and states she will follow up as appropriate.  Patient states she is able to manage self care and has assistance as needed.  Patient voices understanding of medical diagnosis and treatment plan. States she is accessing her [employer] benefits as needed via member services number on back of card.  Discussed Advanced Directives, advised of [facility] Social Worker  Advanced Directives document completion benefit, patient voices understanding, and declined to access benefit at this time.   Patient states she does not have any education material, EMMI follow up, care coordination, care management, disease monitoring, transportation, community resource, or pharmacy needs at this time.  States she is very appreciative of the follow up and is in agreement to receive [facility] EMMI follow up calls as needed.   Objective: Per KPN (Knowledge Performance Now, point of care tool) and chart review, patient hospitalized 08/01/2019 - 08/06/2019 for Atrial fibrillation with rapid ventricular response,  Abnormal uterine bleeding, status post DILATATION & CURETTAGE/HYSTEROSCOPY WITH MYOSURE  On 08/05/2019.   Patient also has a history of hypertension,  ulcerative colitis status post total colectomy in 1977, glaucoma, bilateral knee osteoarthritis status post bilateral total knee replacement (left in 12/2015, right in 06/2016, postmenopausal bleeding, psoriasis, asthma, Vitamin D deficiency, and gout.     Assessment: Received [employer] EMMI General Discharge Red Flag Alert follow up referral on 08/12/2019 and 08/13/2019.  Red Flag Alert Trigger, Day #1, Day #4, patient answered no to the following question: Scheduled follow-up?   EMMI follow up completed and no further care management needs.      Plan: RNCM will complete case closure due to follow up completed / no care management needs.       Zared Knoth H. Annia Friendly, BSN, [place] Telephonic CM Phone: [phone]

## 2019-08-15 ENCOUNTER — Other Ambulatory Visit: Payer: Self-pay

## 2019-08-15 ENCOUNTER — Ambulatory Visit (HOSPITAL_COMMUNITY)
Admission: RE | Admit: 2019-08-15 | Discharge: 2019-08-15 | Disposition: A | Discharge status: Home / Self Care | Payer: Medicare Other | Source: Ambulatory Visit | Attending: Physician Assistant | Admitting: Physician Assistant

## 2019-08-15 ENCOUNTER — Other Ambulatory Visit (HOSPITAL_COMMUNITY): Payer: Self-pay | Admitting: Physician Assistant

## 2019-08-15 ENCOUNTER — Encounter (HOSPITAL_COMMUNITY): Payer: Self-pay | Admitting: Physician Assistant

## 2019-08-15 VITALS — BP 150/70 | HR 87 | Ht 62.0 in | Wt 236.6 lb

## 2019-08-15 DIAGNOSIS — R400 Somnolence: Secondary | ICD-10-CM | POA: Insufficient documentation

## 2019-08-15 DIAGNOSIS — Z7951 Long term (current) use of inhaled steroids: Secondary | ICD-10-CM | POA: Insufficient documentation

## 2019-08-15 DIAGNOSIS — E559 Vitamin D deficiency, unspecified: Secondary | ICD-10-CM | POA: Insufficient documentation

## 2019-08-15 DIAGNOSIS — J45909 Unspecified asthma, uncomplicated: Secondary | ICD-10-CM | POA: Diagnosis not present

## 2019-08-15 DIAGNOSIS — M109 Gout, unspecified: Secondary | ICD-10-CM | POA: Insufficient documentation

## 2019-08-15 DIAGNOSIS — I4819 Other persistent atrial fibrillation: Secondary | ICD-10-CM | POA: Insufficient documentation

## 2019-08-15 DIAGNOSIS — I4891 Unspecified atrial fibrillation: Secondary | ICD-10-CM | POA: Diagnosis present

## 2019-08-15 DIAGNOSIS — R0683 Snoring: Secondary | ICD-10-CM | POA: Diagnosis not present

## 2019-08-15 DIAGNOSIS — L409 Psoriasis, unspecified: Secondary | ICD-10-CM | POA: Diagnosis not present

## 2019-08-15 DIAGNOSIS — Z96653 Presence of artificial knee joint, bilateral: Secondary | ICD-10-CM | POA: Diagnosis not present

## 2019-08-15 DIAGNOSIS — Z6841 Body Mass Index (BMI) 40.0 and over, adult: Secondary | ICD-10-CM | POA: Diagnosis not present

## 2019-08-15 DIAGNOSIS — I10 Essential (primary) hypertension: Secondary | ICD-10-CM | POA: Insufficient documentation

## 2019-08-15 DIAGNOSIS — Z882 Allergy status to sulfonamides status: Secondary | ICD-10-CM | POA: Insufficient documentation

## 2019-08-15 DIAGNOSIS — Z79899 Other long term (current) drug therapy: Secondary | ICD-10-CM | POA: Insufficient documentation

## 2019-08-15 DIAGNOSIS — E669 Obesity, unspecified: Secondary | ICD-10-CM | POA: Insufficient documentation

## 2019-08-15 DIAGNOSIS — K5190 Ulcerative colitis, unspecified, without complications: Secondary | ICD-10-CM | POA: Insufficient documentation

## 2019-08-15 DIAGNOSIS — Z932 Ileostomy status: Secondary | ICD-10-CM | POA: Diagnosis not present

## 2019-08-15 DIAGNOSIS — H409 Unspecified glaucoma: Secondary | ICD-10-CM | POA: Diagnosis not present

## 2019-08-15 DIAGNOSIS — Z9049 Acquired absence of other specified parts of digestive tract: Secondary | ICD-10-CM | POA: Diagnosis not present

## 2019-08-15 DIAGNOSIS — D6869 Other thrombophilia: Secondary | ICD-10-CM

## 2019-08-15 MED ORDER — POTASSIUM CHLORIDE ER 10 MEQ PO TBCR: EXTENDED_RELEASE_TABLET | ORAL | 1 refills | Status: DC

## 2019-08-15 MED ORDER — FUROSEMIDE 20 MG PO TABS: ORAL_TABLET | ORAL | 1 refills | Status: DC

## 2019-08-15 NOTE — Patient Instructions (Signed)
Start Lasix 51m daily for 3 days and then as needed for swelling or weight gain Start Potassium 120m once daily for 3 days when taking Lasix Weigh daily first thing in the morning and then record if notices 3 pound weight gain which would be the reason to take the Lasix

## 2019-08-15 NOTE — Progress Notes (Signed)
Primary Care Physician: [name], MD Primary Cardiologist: none Primary Electrophysiologist: none Referring Physician: [name] is a 78 y.o. female with a history of hypertension, ulcerative colitis s/p total colectomy in 1977, glaucoma, bilateral knee osteoarthritis s/p bilateral total knee replacement (left in 12/2015, right in 06/2016), chronic hypokalemia, postmenopausal bleeding, psoriasis, and asthma who presents for consultation in the [facility]. Patient was brought to the ED due to atrial fibrillation with rapid ventricular response from a preop appointment for an D&C to treat abnormal uterine bleeding. EKG on arrival showed atrial fibrillation with RVR, with HR in the 180s. Patient was rate controlled with diltiazem. She underwent D&C during her admission. Per GYN recommendations, she was not started on anticoagulation for a CHADS2VASC score of 4. Patient reports that overall she feels much better but does admit to some SOB this AM. Her weight is up and she has increased lower extremity edema. She does admit to snoring, witnessed apnea, and daytime somnolence. She denies significant alcohol use.   Today, she denies symptoms of palpitations, chest pain, shortness of breath, orthopnea, PND, lower extremity edema, dizziness, presyncope, syncope, or neurologic sequela. The patient is tolerating medications without difficulties and is otherwise without complaint today.    Atrial Fibrillation Risk Factors:  she does have symptoms or diagnosis of sleep apnea. she does not have a history of rheumatic fever. she does not have a history of alcohol use. The patient does not have a history of early familial atrial fibrillation or other arrhythmias.  she has a BMI of Body mass index is 43.27 kg/m.Marland Kitchen Filed Weights   08/15/19 0957  Weight: 107.3 kg    Family History  Problem Relation Age of Onset  . Hypertension Sister   . Glaucoma  Sister      Atrial Fibrillation Management history:  Previous antiarrhythmic drugs: none Previous cardioversions: none Previous ablations: none CHADS2VASC score: 4 Anticoagulation history: none   Past Medical History:  Diagnosis Date  . AKI (acute kidney injury) ([place]) 12/2015  . Allergy    takes Singulair and Zyrtec daily  . Asthma    uses Adair daily  . Cataracts, bilateral   . Colitis, ulcerative ([place])   . DJD (degenerative joint disease)   . Glaucoma    uses eye drops daily  . History of blood transfusion    no abnormal reaction noted. 40+ yrs ago  . History of bronchitis    yrs ago  . History of gout    not on any meds  . Hypertension    takes Amlodipine daily  . Joint pain   . Joint swelling   . PMB (postmenopausal bleeding)   . Psoriasis   . Urinary frequency    takes Ditropan daily  . Vertigo    doesn't take any meds  . Vitamin D deficiency    Past Surgical History:  Procedure Laterality Date  . BACK SURGERY    . COLONOSCOPY    . DILATATION & CURETTAGE/HYSTEROSCOPY WITH MYOSURE N/A 08/05/2019   Procedure: DILATATION & CURETTAGE/HYSTEROSCOPY WITH MYOSURE;  Surgeon: Christophe Louis, MD;  Location: [facility];  Service: Gynecology;  Laterality: N/A;  Myosure rep will be here confirmed on 08/01/19 CS  . ILEOSTOMY    . KNEE ARTHROPLASTY Left 12/14/2015   Procedure: COMPUTER ASSISTED LEFT TOTAL KNEE ARTHROPLASTY;  Surgeon: Jessy Oto, MD;  Location: [facility];  Service: Orthopedics;  Laterality: Left;  . TOTAL KNEE ARTHROPLASTY Left 12/14/2015  .  TOTAL KNEE ARTHROPLASTY Right 06/27/2016  . TOTAL KNEE ARTHROPLASTY Right 06/27/2016   Procedure: RIGHT TOTAL KNEE ARTHROPLASTY;  Surgeon: Jessy Oto, MD;  Location: [facility];  Service: Orthopedics;  Laterality: Right;  . TUBAL LIGATION      Current Outpatient Medications  Medication Sig Dispense Refill  . albuterol (PROAIR HFA) 108 (90 Base) MCG/ACT inhaler INHALE 2 PUFFS INTO THE LUNGS EVERY 6 HOURS AS NEEDED FOR WHEEZING OR  SHORTNESS OF BREATH 8.5 g 6  . allopurinol (ZYLOPRIM) 100 MG tablet TAKE 1 TABLET(100 MG) BY MOUTH DAILY (Patient taking differently: Take 100 mg by mouth daily. ) 30 tablet 1  . brimonidine (ALPHAGAN) 0.2 % ophthalmic solution Place 1 drop into the right eye 3 (three) times daily.    . brinzolamide (AZOPT) 1 % ophthalmic suspension Place 1 drop into the right eye 2 (two) times daily.    . cetirizine (ZYRTEC) 10 MG tablet TAKE 1 TABLET(10 MG) BY MOUTH DAILY (Patient taking differently: Take 10 mg by mouth daily. ) 30 tablet 2  . cholecalciferol (VITAMIN D) 1000 units tablet Take 1,000 Units by mouth daily.    . colchicine 0.6 MG tablet Take 2 tabs (1.2 mg) at the onset of a gout attack, may repeat 1 tab (0.6 mg) 2 hours later if symptoms persist. 30 tablet 1  . fluocinonide ointment (LIDEX) 6.20 % Apply 1 application topically 2 (two) times daily as needed for itching.    . fluticasone (FLOVENT HFA) 110 MCG/ACT inhaler Inhale 2 puffs into the lungs 2 (two) times daily. 1 Inhaler 6  . ibuprofen (ADVIL) 200 MG tablet Take 400 mg by mouth every 6 (six) hours as needed for headache or moderate pain.    Marland Kitchen LUMIGAN 0.01 % SOLN Place 1 drop into both eyes at bedtime.    . medroxyPROGESTERone (PROVERA) 10 MG tablet Take 2 tablets (20 mg total) by mouth daily. 60 tablet 0  . metoprolol tartrate (LOPRESSOR) 25 MG tablet Take 1 tablet (25 mg total) by mouth 2 (two) times daily. 60 tablet 0  . prochlorperazine (COMPAZINE) 10 MG tablet TAKE 1 TABLET(10 MG) BY MOUTH TWICE DAILY AS NEEDED FOR NAUSEA OR VOMITING (Patient taking differently: Take 10 mg by mouth 2 (two) times daily as needed for nausea or vomiting. ) 30 tablet 0  . furosemide (LASIX) 20 MG tablet TAKE 1 TABLET BY MOUTH DAILY FOR 3 DAYS THEN AS NEEDED FOR SWELLING OR WEIGHT GAIN 30 tablet 1  . potassium chloride (KLOR-CON) 10 MEQ tablet TAKE 1 TABLET BY MOUTH DAILY FOR 3 DAYS THEN AS NEEDED WHEN LASIX TAKEN 90 tablet 0   Current Facility-Administered  Medications  Medication Dose Route Frequency Provider Last Rate Last Dose  . indomethacin (INDOCIN) capsule 25 mg  25 mg Oral BID WC Jessy Oto, MD        Allergies  Allergen Reactions  . Sulfonamide Derivatives Rash    Social History   Socioeconomic History  . Marital status: Single    Spouse name: Not on file  . Number of children: Not on file  . Years of education: Not on file  . Highest education level: Not on file  Occupational History  . Not on file  Social Needs  . Financial resource strain: Not on file  . Food insecurity    Worry: Not on file    Inability: Not on file  . Transportation needs    Medical: Not on file    Non-medical: Not on file  Tobacco Use  . Smoking status: Never Smoker  . Smokeless tobacco: Never Used  Substance and Sexual Activity  . Alcohol use: No    Alcohol/week: 0.0 standard drinks  . Drug use: No  . Sexual activity: Not Currently    Birth control/protection: Post-menopausal  Lifestyle  . Physical activity    Days per week: Not on file    Minutes per session: Not on file  . Stress: Not on file  Relationships  . Social Herbalist on phone: Not on file    Gets together: Not on file    Attends religious service: Not on file    Active member of club or organization: Not on file    Attends meetings of clubs or organizations: Not on file    Relationship status: Not on file  . Intimate partner violence    Fear of current or ex partner: Not on file    Emotionally abused: Not on file    Physically abused: Not on file    Forced sexual activity: Not on file  Other Topics Concern  . Not on file  Social History Narrative  . Not on file     ROS- All systems are reviewed and negative except as per the HPI above.  Physical Exam: Vitals:   08/15/19 0957  BP: (!) 150/70  Pulse: 87  SpO2: 100%  Weight: 107.3 kg  Height: 5' 2"  (1.575 m)    GEN- The patient is well appearing obese elderly female, alert and oriented x 3  today.   Head- normocephalic, atraumatic Eyes-  Sclera clear, conjunctiva pink Ears- hearing intact Oropharynx- clear Neck- supple  Lungs- Clear to ausculation bilaterally, normal work of breathing Heart- Regular rate and rhythm, no murmurs, rubs or gallops  GI- soft, NT, ND, + BS Extremities- no clubbing, cyanosis. 1+ bilateral edema MS- no significant deformity or atrophy Skin- no rash or lesion Psych- euthymic mood, full affect Neuro- strength and sensation are intact  Wt Readings from Last 3 Encounters:  08/15/19 107.3 kg  08/06/19 103.2 kg  05/15/19 100.2 kg    EKG today demonstrates SR HR 87, PR 132, QRS 78, QTc 440  Echo 08/02/19 demonstrated   1. Left ventricular ejection fraction, by visual estimation, is 55 to 60%. The left ventricle has normal function. There is mildly increased left ventricular hypertrophy.  2. The left ventricle has no regional wall motion abnormalities.  3. Global right ventricle has normal systolic function.The right ventricular size is normal. No increase in right ventricular wall thickness.  4. Left atrial size was normal.  5. Right atrial size was normal.  6. The mitral valve is normal in structure. Trace mitral valve regurgitation. No evidence of mitral stenosis.  7. The tricuspid valve is normal in structure. Tricuspid valve regurgitation is mild.  8. The aortic valve is normal in structure. Aortic valve regurgitation is not visualized. Mild aortic valve sclerosis without stenosis.  9. There is Mild calcification of the aortic valve. 10. There is Mild thickening of the aortic valve. 11. The pulmonic valve was grossly normal. Pulmonic valve regurgitation is not visualized. 12. Moderately elevated pulmonary artery systolic pressure. 13. The inferior vena cava is dilated in size with <50% respiratory variability, suggesting right atrial pressure of 15 mmHg.  Epic records are reviewed at length today  Assessment and Plan:  1. Persistent  atrial fibrillation Unclear duration. ? Related to anemia with abnormal bleeding. General education about afib provided and questions answered.  We  also discussed her stroke risk and the risks and benefits of anticoagulation. Per GYN recommendations, will not start anticoagulation at this time. Will defer timing of anticoagulation start to her GYN. She has close follow up scheduled. Continue Lopressor 25 mg BID Lifestyle changes as below.  This patients CHA2DS2-VASc Score and unadjusted Ischemic Stroke Rate (% per year) is equal to 4.8 % stroke rate/year from a score of 4  Above score calculated as 1 point each if present [CHF, HTN, DM, Vascular=MI/PAD/Aortic Plaque, Age if 65-74, or Female] Above score calculated as 2 points each if present [Age > 75, or Stroke/TIA/TE]   2. Obesity Body mass index is 43.27 kg/m. Lifestyle modification was discussed at length including regular exercise and weight reduction.  3. Snoring/witness apnea/daytime somnolence The importance of adequate treatment of sleep apnea was discussed today in order to improve our ability to maintain sinus rhythm long term. Will refer for sleep study.  4. HTN Stable, no changes today.  5. Lower extremity edema Will start Lasix 20 mg daily x3 days then PRN thereafter. Will also start KCl 10 meq to take with Lasix to avoid hypokalemia.    Follow up in the AF clinic in two weeks.    [facility] [address] [place], [place] [address] [phone] 08/15/2019 1:17 PM

## 2019-08-19 DIAGNOSIS — N950 Postmenopausal bleeding: Secondary | ICD-10-CM | POA: Diagnosis not present

## 2019-08-20 ENCOUNTER — Telehealth: Payer: Self-pay | Admitting: Family Medicine

## 2019-08-20 NOTE — Telephone Encounter (Signed)
Patient called stating that she would like to speak with Dr. Margarita Rana or her nurse. She said she was having difficulty breathing but had been to the ER two weeks ago. I directed the call to the triage nurse, Carilyn Goodpasture.

## 2019-08-20 NOTE — Telephone Encounter (Signed)
C/o [facility] and feels like heart is beating so fast when she moves around. More short of breath than usual. Denies adding salts or drinking lots of fluid. Taking Lasix and  Inhalers are not working. Does not take iron supplement.  BP is 150/70. Patient sounds like she is short of breath by having to pause in words in her sentences. Denies chest pain. She was advised to go to [facility] or ED. Patient verbalized understanding.

## 2019-08-24 DIAGNOSIS — Z933 Colostomy status: Secondary | ICD-10-CM | POA: Diagnosis not present

## 2019-08-26 ENCOUNTER — Telehealth: Payer: Self-pay | Admitting: *Deleted

## 2019-08-26 NOTE — Telephone Encounter (Signed)
Called and left the patient a message to call the office back. Need to schedule the patient for a new patient appt

## 2019-08-27 ENCOUNTER — Telehealth: Payer: Self-pay | Admitting: *Deleted

## 2019-08-27 NOTE — Telephone Encounter (Signed)
Left the patient a message to call the office back. Patient needs to be scheduled for a new patient appt

## 2019-08-28 ENCOUNTER — Telehealth: Payer: Self-pay | Admitting: *Deleted

## 2019-08-28 NOTE — Telephone Encounter (Signed)
Called and left the patient a message to call the office back. Need to schedule the patient for a new patient appt

## 2019-08-29 ENCOUNTER — Ambulatory Visit (HOSPITAL_COMMUNITY)
Admission: RE | Admit: 2019-08-29 | Discharge: 2019-08-29 | Disposition: A | Discharge status: Home / Self Care | Payer: Medicare Other | Source: Ambulatory Visit | Attending: Physician Assistant | Admitting: Physician Assistant

## 2019-08-29 ENCOUNTER — Other Ambulatory Visit: Payer: Self-pay

## 2019-08-29 VITALS — BP 190/86 | HR 80 | Ht 62.0 in | Wt 238.8 lb

## 2019-08-29 DIAGNOSIS — R0683 Snoring: Secondary | ICD-10-CM | POA: Diagnosis not present

## 2019-08-29 DIAGNOSIS — Z8249 Family history of ischemic heart disease and other diseases of the circulatory system: Secondary | ICD-10-CM | POA: Diagnosis not present

## 2019-08-29 DIAGNOSIS — Z83511 Family history of glaucoma: Secondary | ICD-10-CM | POA: Insufficient documentation

## 2019-08-29 DIAGNOSIS — Z882 Allergy status to sulfonamides status: Secondary | ICD-10-CM | POA: Diagnosis not present

## 2019-08-29 DIAGNOSIS — I10 Essential (primary) hypertension: Secondary | ICD-10-CM | POA: Diagnosis not present

## 2019-08-29 DIAGNOSIS — Z96653 Presence of artificial knee joint, bilateral: Secondary | ICD-10-CM | POA: Insufficient documentation

## 2019-08-29 DIAGNOSIS — J45909 Unspecified asthma, uncomplicated: Secondary | ICD-10-CM | POA: Insufficient documentation

## 2019-08-29 DIAGNOSIS — D6869 Other thrombophilia: Secondary | ICD-10-CM | POA: Diagnosis not present

## 2019-08-29 DIAGNOSIS — R9431 Abnormal electrocardiogram [ECG] [EKG]: Secondary | ICD-10-CM | POA: Insufficient documentation

## 2019-08-29 DIAGNOSIS — R600 Localized edema: Secondary | ICD-10-CM | POA: Insufficient documentation

## 2019-08-29 DIAGNOSIS — E669 Obesity, unspecified: Secondary | ICD-10-CM | POA: Diagnosis not present

## 2019-08-29 DIAGNOSIS — R400 Somnolence: Secondary | ICD-10-CM | POA: Insufficient documentation

## 2019-08-29 DIAGNOSIS — H409 Unspecified glaucoma: Secondary | ICD-10-CM | POA: Insufficient documentation

## 2019-08-29 DIAGNOSIS — M109 Gout, unspecified: Secondary | ICD-10-CM | POA: Insufficient documentation

## 2019-08-29 DIAGNOSIS — Z79899 Other long term (current) drug therapy: Secondary | ICD-10-CM | POA: Insufficient documentation

## 2019-08-29 DIAGNOSIS — E559 Vitamin D deficiency, unspecified: Secondary | ICD-10-CM | POA: Insufficient documentation

## 2019-08-29 DIAGNOSIS — G4730 Sleep apnea, unspecified: Secondary | ICD-10-CM | POA: Insufficient documentation

## 2019-08-29 DIAGNOSIS — E876 Hypokalemia: Secondary | ICD-10-CM | POA: Diagnosis not present

## 2019-08-29 DIAGNOSIS — I4819 Other persistent atrial fibrillation: Secondary | ICD-10-CM | POA: Insufficient documentation

## 2019-08-29 DIAGNOSIS — M170 Bilateral primary osteoarthritis of knee: Secondary | ICD-10-CM | POA: Insufficient documentation

## 2019-08-29 DIAGNOSIS — Z6841 Body Mass Index (BMI) 40.0 and over, adult: Secondary | ICD-10-CM | POA: Insufficient documentation

## 2019-08-29 MED ORDER — METOPROLOL TARTRATE 25 MG PO TABS: 37.5000 mg | ORAL_TABLET | Freq: Two times a day (BID) | ORAL | 3 refills | Status: DC

## 2019-08-29 NOTE — Patient Instructions (Signed)
Increase metoprolol to 37.51m twice a day (1 and 1/2 tablets twice a day)

## 2019-08-29 NOTE — Progress Notes (Addendum)
Primary Care Physician: [name], MD Primary Cardiologist: none Primary Electrophysiologist: none Referring Physician: [name] is a 78 y.o. 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 Patient was brought to the ED due to atrial fibrillation with rapid ventricular response from a preop appointment for an D&C to treat abnormal uterine bleeding. EKG on arrival showed atrial fibrillation with RVR, with HR in the 180s. Patient was rate controlled with diltiazem. She underwent D&C during her admission. Per GYN recommendations, she was not started on anticoagulation for a CHADS2VASC score of 4. She does admit to snoring, witnessed apnea, and daytime somnolence. She denies significant alcohol use.   On follow up today, patient reports that she continues to have postmenopausal bleeding. She has followed up with her GYN with plans for oncology GYN referral. Patient reports that she is SOB at times and feels that she does not have the stamina she used to. She states "I don't really feel bad, I just have a lot going on." She did have an episode of tachy palpitations this morning while dressing. She denies any symptoms of fluid overload.  Today, she denies symptoms of chest pain, shortness of breath, orthopnea, PND, lower extremity edema, dizziness, presyncope, syncope, or neurologic sequela. The patient is tolerating medications without difficulties and is otherwise without complaint today.    Atrial Fibrillation Risk Factors:  she does have symptoms or diagnosis of sleep apnea. she does not have a history of rheumatic fever. she does not have a history of alcohol use. The patient  does not have a history of early familial atrial fibrillation or other arrhythmias.  she has a BMI of Body mass index is 43.68 kg/m.Marland Kitchen Filed Weights   08/29/19 1040  Weight: 108.3 kg    Family History  Problem Relation Age of Onset  . Hypertension Sister   . Glaucoma Sister      Atrial Fibrillation Management history:  Previous antiarrhythmic drugs: none Previous cardioversions: none Previous ablations: none CHADS2VASC score: 4 Anticoagulation history: none   Past Medical History:  Diagnosis Date  . AKI (acute kidney injury) ([place]) 12/2015  . Allergy    takes Singulair and Zyrtec daily  . Asthma    uses Adair daily  . Cataracts, bilateral   . Colitis, ulcerative ([place])   . DJD (degenerative joint disease)   . Glaucoma    uses eye drops daily  . History of blood transfusion    no abnormal reaction noted. 40+ yrs ago  . History of bronchitis    yrs ago  . History of gout    not on any meds  . Hypertension    takes Amlodipine daily  . Joint pain   . Joint swelling   . PMB (postmenopausal bleeding)   . Psoriasis   . Urinary frequency    takes Ditropan daily  . Vertigo    doesn't take any meds  . Vitamin D deficiency    Past Surgical History:  Procedure Laterality Date  . BACK SURGERY    . COLONOSCOPY    . DILATATION & CURETTAGE/HYSTEROSCOPY WITH MYOSURE N/A 08/05/2019   Procedure: DILATATION & CURETTAGE/HYSTEROSCOPY WITH MYOSURE;  Surgeon: Christophe Louis, MD;  Location: [place];  Service: Gynecology;  Laterality: N/A;  Myosure rep  will be here confirmed on 08/01/19 CS  . ILEOSTOMY    . KNEE ARTHROPLASTY Left 12/14/2015   Procedure: COMPUTER ASSISTED LEFT TOTAL KNEE ARTHROPLASTY;  Surgeon: Jessy Oto, MD;  Location: [place];  Service: Orthopedics;  Laterality: Left;  . TOTAL KNEE ARTHROPLASTY Left 12/14/2015  . TOTAL KNEE ARTHROPLASTY Right 06/27/2016  . TOTAL KNEE ARTHROPLASTY Right 06/27/2016   Procedure: RIGHT TOTAL KNEE ARTHROPLASTY;  Surgeon: Jessy Oto, MD;   Location: [place];  Service: Orthopedics;  Laterality: Right;  . TUBAL LIGATION      Current Outpatient Medications  Medication Sig Dispense Refill  . albuterol (PROAIR HFA) 108 (90 Base) MCG/ACT inhaler INHALE 2 PUFFS INTO THE LUNGS EVERY 6 HOURS AS NEEDED FOR WHEEZING OR SHORTNESS OF BREATH 8.5 g 6  . allopurinol (ZYLOPRIM) 100 MG tablet TAKE 1 TABLET(100 MG) BY MOUTH DAILY (Patient taking differently: Take 100 mg by mouth as needed. ) 30 tablet 1  . brimonidine (ALPHAGAN) 0.2 % ophthalmic solution Place 1 drop into the right eye 3 (three) times daily.    . brinzolamide (AZOPT) 1 % ophthalmic suspension Place 1 drop into the right eye 3 (three) times daily.     . cetirizine (ZYRTEC) 10 MG tablet TAKE 1 TABLET(10 MG) BY MOUTH DAILY (Patient taking differently: Take 10 mg by mouth daily. ) 30 tablet 2  . cholecalciferol (VITAMIN D) 1000 units tablet Take 1,000 Units by mouth daily.    . colchicine 0.6 MG tablet Take 2 tabs (1.2 mg) at the onset of a gout attack, may repeat 1 tab (0.6 mg) 2 hours later if symptoms persist. 30 tablet 1  . fluocinonide ointment (LIDEX) 5.73 % Apply 1 application topically 2 (two) times daily as needed for itching.    . fluticasone (FLOVENT HFA) 110 MCG/ACT inhaler Inhale 2 puffs into the lungs 2 (two) times daily. 1 Inhaler 6  . furosemide (LASIX) 20 MG tablet TAKE 1 TABLET BY MOUTH DAILY FOR 3 DAYS THEN AS NEEDED FOR SWELLING OR WEIGHT GAIN (Patient taking differently: as needed. TAKE 1 TABLET BY MOUTH DAILY FOR 3 DAYS THEN AS NEEDED FOR SWELLING OR WEIGHT GAIN) 30 tablet 1  . LUMIGAN 0.01 % SOLN Place 1 drop into both eyes at bedtime.    . medroxyPROGESTERone (PROVERA) 10 MG tablet Take 2 tablets (20 mg total) by mouth daily. 60 tablet 0  . metoprolol tartrate (LOPRESSOR) 25 MG tablet Take 1.5 tablets (37.5 mg total) by mouth 2 (two) times daily. 90 tablet 3  . potassium chloride (KLOR-CON) 10 MEQ tablet TAKE 1 TABLET BY MOUTH DAILY FOR 3 DAYS THEN AS NEEDED WHEN  LASIX TAKEN (Patient taking differently: as needed. TAKE 1 TABLET BY MOUTH DAILY FOR 3 DAYS THEN AS NEEDED WHEN LASIX TAKEN) 90 tablet 0  . prochlorperazine (COMPAZINE) 10 MG tablet TAKE 1 TABLET(10 MG) BY MOUTH TWICE DAILY AS NEEDED FOR NAUSEA OR VOMITING (Patient taking differently: Take 10 mg by mouth 2 (two) times daily as needed for nausea or vomiting. ) 30 tablet 0   Current Facility-Administered Medications  Medication Dose Route Frequency Provider Last Rate Last Admin  . indomethacin (INDOCIN) capsule 25 mg  25 mg Oral BID WC Jessy Oto, MD        Allergies  Allergen Reactions  . Sulfonamide Derivatives Rash    Social History   Socioeconomic History  . Marital status: Single    Spouse name: Not on file  . Number of children: Not on file  .  Years of education: Not on file  . Highest education level: Not on file  Occupational History  . Not on file  Tobacco Use  . Smoking status: Never Smoker  . Smokeless tobacco: Never Used  Substance and Sexual Activity  . Alcohol use: No    Alcohol/week: 0.0 standard drinks  . Drug use: No  . Sexual activity: Not Currently    Birth control/protection: Post-menopausal  Other Topics Concern  . Not on file  Social History Narrative  . Not on file   Social Determinants of Health   Financial Resource Strain:   . Difficulty of Paying Living Expenses: Not on file  Food Insecurity:   . Worried About Charity fundraiser in the Last Year: Not on file  . Ran Out of Food in the Last Year: Not on file  Transportation Needs:   . Lack of Transportation (Medical): Not on file  . Lack of Transportation (Non-Medical): Not on file  Physical Activity:   . Days of Exercise per Week: Not on file  . Minutes of Exercise per Session: Not on file  Stress:   . Feeling of Stress : Not on file  Social Connections:   . Frequency of Communication with Friends and Family: Not on file  . Frequency of Social Gatherings with Friends and Family: Not on  file  . Attends Religious Services: Not on file  . Active Member of Clubs or Organizations: Not on file  . Attends Archivist Meetings: Not on file  . Marital Status: Not on file  Intimate Partner Violence:   . Fear of Current or Ex-Partner: Not on file  . Emotionally Abused: Not on file  . Physically Abused: Not on file  . Sexually Abused: Not on file     ROS- All systems are reviewed and negative except as per the HPI above.  Physical Exam: Vitals:   08/29/19 1040  BP: (!) 190/86  Pulse: 80  SpO2: 97%  Weight: 108.3 kg  Height: 5' 2"  (1.575 m)    GEN- The patient is well appearing obese elderly female, alert and oriented x 3 today.   HEENT-head normocephalic, atraumatic, sclera clear, conjunctiva pink, hearing intact, trachea midline. Lungs- Clear to ausculation bilaterally, normal work of breathing Heart- irregular rate and rhythm, no murmurs, rubs or gallops  GI- soft, NT, ND, + BS Extremities- no clubbing, cyanosis. Trace bilateral edema MS- no significant deformity or atrophy Skin- no rash or lesion Psych- euthymic mood, full affect Neuro- strength and sensation are intact   Wt Readings from Last 3 Encounters:  08/29/19 108.3 kg  08/15/19 107.3 kg  08/06/19 103.2 kg    EKG today demonstrates afib HR 80, QRS 74, QTc 415 Addendum 10/02/19: Per review by Dr Caryl Comes, ECG shows SR.  Echo 08/02/19 demonstrated   1. Left ventricular ejection fraction, by visual estimation, is 55 to 60%. The left ventricle has normal function. There is mildly increased left ventricular hypertrophy.  2. The left ventricle has no regional wall motion abnormalities.  3. Global right ventricle has normal systolic function.The right ventricular size is normal. No increase in right ventricular wall thickness.  4. Left atrial size was normal.  5. Right atrial size was normal.  6. The mitral valve is normal in structure. Trace mitral valve regurgitation. No evidence of mitral  stenosis.  7. The tricuspid valve is normal in structure. Tricuspid valve regurgitation is mild.  8. The aortic valve is normal in structure. Aortic valve regurgitation is  not visualized. Mild aortic valve sclerosis without stenosis.  9. There is Mild calcification of the aortic valve. 10. There is Mild thickening of the aortic valve. 11. The pulmonic valve was grossly normal. Pulmonic valve regurgitation is not visualized. 12. Moderately elevated pulmonary artery systolic pressure. 13. The inferior vena cava is dilated in size with <50% respiratory variability, suggesting right atrial pressure of 15 mmHg.  Epic records are reviewed at length today  Assessment and Plan:  1. Persistent atrial fibrillation Unclear duration. Possibly related to anemia with postmenopausal bleeding. Both afib and anemia likely contributing to her symptoms.  Hgb on 08/19/19 was 10. Per Dr Sundra Aland office visit note 08/19/19, anticoagulation is not recommended until uterine bleeding resolved. Plans for GYN oncology referral noted. Patient is aware of and voices understanding of her stroke risk. We are limited to rate control for now.  Increase Lopressor to 37.5 mg BID Lifestyle changes as below.  This patients CHA2DS2-VASc Score and unadjusted Ischemic Stroke Rate (% per year) is equal to 4.8 % stroke rate/year from a score of 4  Above score calculated as 1 point each if present [CHF, HTN, DM, Vascular=MI/PAD/Aortic Plaque, Age if 65-74, or Female] Above score calculated as 2 points each if present [Age > 75, or Stroke/TIA/TE]   2. Obesity Body mass index is 43.68 kg/m. Lifestyle modification was discussed and encouraged including regular physical activity and weight reduction.  3. Snoring/witness apnea/daytime somnolence The importance of adequate treatment of sleep apnea was discussed today in order to improve our ability to maintain sinus rhythm long term. Sleep study pending.  4. HTN Elevated today, has  been well controlled in the past. Med changes as above. Return for BP check next week.  5. Lower extremity edema Improved with short course of Lasix. Patient has not needed any PRN doses.   Follow up in the AF clinic next week for BP check and office visit in one month.   [facility] [address] [place], [place] [address] [phone] 08/29/2019 11:56 AM

## 2019-09-02 ENCOUNTER — Other Ambulatory Visit: Payer: Self-pay

## 2019-09-02 ENCOUNTER — Encounter (HOSPITAL_COMMUNITY): Payer: Self-pay | Admitting: Physician Assistant

## 2019-09-02 ENCOUNTER — Telehealth: Payer: Self-pay | Admitting: *Deleted

## 2019-09-02 ENCOUNTER — Ambulatory Visit (HOSPITAL_COMMUNITY)
Admission: RE | Admit: 2019-09-02 | Discharge: 2019-09-02 | Disposition: A | Discharge status: Home / Self Care | Payer: Medicare Other | Source: Ambulatory Visit | Attending: Physician Assistant | Admitting: Physician Assistant

## 2019-09-02 DIAGNOSIS — I10 Essential (primary) hypertension: Secondary | ICD-10-CM | POA: Insufficient documentation

## 2019-09-02 NOTE — Telephone Encounter (Signed)
Called Dr [employer] office and spoke with [facility], explained that we have called and left the patient three messages with no return call

## 2019-09-09 ENCOUNTER — Other Ambulatory Visit: Payer: Self-pay | Admitting: Internal Medicine

## 2019-09-09 ENCOUNTER — Telehealth: Payer: Self-pay | Admitting: *Deleted

## 2019-09-09 DIAGNOSIS — L400 Psoriasis vulgaris: Secondary | ICD-10-CM | POA: Diagnosis not present

## 2019-09-09 NOTE — Telephone Encounter (Signed)
Patient called and scheduled her new patient appt

## 2019-09-13 DIAGNOSIS — Z933 Colostomy status: Secondary | ICD-10-CM | POA: Diagnosis not present

## 2019-09-26 ENCOUNTER — Telehealth (HOSPITAL_COMMUNITY): Payer: Self-pay | Admitting: *Deleted

## 2019-09-26 MED ORDER — DILTIAZEM HCL ER COATED BEADS 180 MG PO CP24: 180.0000 mg | ORAL_CAPSULE | Freq: Every day | ORAL | 3 refills | Status: DC

## 2019-09-26 NOTE — Telephone Encounter (Signed)
Patient called in with increased shortness of breath denies weight gain, swelling. HR 89 BP 150/75. Pt does have history of asthma - per Adline Peals PA - will change metoprolol to cardizem 168m once a day. Pt verbalized understanding of instructions has follow up already in place for 1/19. Will call if issues arise prior to appt.

## 2019-09-27 ENCOUNTER — Telehealth: Payer: Self-pay | Admitting: Family Medicine

## 2019-10-01 ENCOUNTER — Ambulatory Visit (HOSPITAL_COMMUNITY)
Admission: RE | Admit: 2019-10-01 | Discharge: 2019-10-01 | Disposition: A | Discharge status: Home / Self Care | Payer: Medicare Other | Source: Ambulatory Visit | Attending: Physician Assistant | Admitting: Physician Assistant

## 2019-10-01 ENCOUNTER — Other Ambulatory Visit: Payer: Self-pay

## 2019-10-01 VITALS — BP 186/72 | HR 142 | Ht 62.0 in | Wt 233.0 lb

## 2019-10-01 DIAGNOSIS — I10 Essential (primary) hypertension: Secondary | ICD-10-CM | POA: Diagnosis not present

## 2019-10-01 DIAGNOSIS — N950 Postmenopausal bleeding: Secondary | ICD-10-CM | POA: Diagnosis not present

## 2019-10-01 DIAGNOSIS — E669 Obesity, unspecified: Secondary | ICD-10-CM | POA: Diagnosis not present

## 2019-10-01 DIAGNOSIS — Z7951 Long term (current) use of inhaled steroids: Secondary | ICD-10-CM | POA: Insufficient documentation

## 2019-10-01 DIAGNOSIS — I4891 Unspecified atrial fibrillation: Secondary | ICD-10-CM | POA: Diagnosis present

## 2019-10-01 DIAGNOSIS — R400 Somnolence: Secondary | ICD-10-CM | POA: Insufficient documentation

## 2019-10-01 DIAGNOSIS — R0683 Snoring: Secondary | ICD-10-CM | POA: Insufficient documentation

## 2019-10-01 DIAGNOSIS — I4819 Other persistent atrial fibrillation: Secondary | ICD-10-CM | POA: Diagnosis not present

## 2019-10-01 DIAGNOSIS — H409 Unspecified glaucoma: Secondary | ICD-10-CM | POA: Diagnosis not present

## 2019-10-01 DIAGNOSIS — Z882 Allergy status to sulfonamides status: Secondary | ICD-10-CM | POA: Diagnosis not present

## 2019-10-01 DIAGNOSIS — D6869 Other thrombophilia: Secondary | ICD-10-CM | POA: Diagnosis not present

## 2019-10-01 DIAGNOSIS — L409 Psoriasis, unspecified: Secondary | ICD-10-CM | POA: Insufficient documentation

## 2019-10-01 DIAGNOSIS — Z6841 Body Mass Index (BMI) 40.0 and over, adult: Secondary | ICD-10-CM | POA: Diagnosis not present

## 2019-10-01 DIAGNOSIS — K5190 Ulcerative colitis, unspecified, without complications: Secondary | ICD-10-CM | POA: Diagnosis not present

## 2019-10-01 DIAGNOSIS — Z8249 Family history of ischemic heart disease and other diseases of the circulatory system: Secondary | ICD-10-CM | POA: Diagnosis not present

## 2019-10-01 DIAGNOSIS — J45909 Unspecified asthma, uncomplicated: Secondary | ICD-10-CM | POA: Diagnosis not present

## 2019-10-01 DIAGNOSIS — Z888 Allergy status to other drugs, medicaments and biological substances status: Secondary | ICD-10-CM | POA: Diagnosis not present

## 2019-10-01 DIAGNOSIS — Z96653 Presence of artificial knee joint, bilateral: Secondary | ICD-10-CM | POA: Diagnosis not present

## 2019-10-01 DIAGNOSIS — Z79899 Other long term (current) drug therapy: Secondary | ICD-10-CM | POA: Insufficient documentation

## 2019-10-01 DIAGNOSIS — R600 Localized edema: Secondary | ICD-10-CM | POA: Diagnosis not present

## 2019-10-01 DIAGNOSIS — I471 Supraventricular tachycardia: Secondary | ICD-10-CM | POA: Diagnosis not present

## 2019-10-01 DIAGNOSIS — Z9049 Acquired absence of other specified parts of digestive tract: Secondary | ICD-10-CM | POA: Diagnosis not present

## 2019-10-01 MED ORDER — DILTIAZEM HCL ER COATED BEADS 300 MG PO CP24: 300.0000 mg | ORAL_CAPSULE | Freq: Every day | ORAL | 3 refills | Status: DC

## 2019-10-01 NOTE — Progress Notes (Signed)
Primary Care Physician: [name], MD Primary Cardiologist: none Primary Electrophysiologist: none Referring Physician: [name] is a 79 y.o. 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 Patient was brought to the ED due to atrial fibrillation with rapid ventricular response from a preop appointment for an D&C to treat abnormal uterine bleeding. EKG on arrival showed atrial fibrillation with RVR, with HR in the 180s. Patient was rate controlled with diltiazem. She underwent D&C during her admission. Per GYN recommendations, she was not started on anticoagulation for a CHADS2VASC score of 4. She does admit to snoring, witnessed apnea, and daytime somnolence. She denies significant alcohol use.   On follow up today, patient reports that her SOB has improved since changing from metoprolol to diltiazem. Her weight is down since her last visit. She has an appointment with GYN oncology next week. She continues to fatigue easily with exertion.  Today, she denies symptoms of palpitations, chest pain, orthopnea, PND, dizziness, presyncope, syncope, or neurologic sequela. The patient is tolerating medications without difficulties and is otherwise without complaint today.    Atrial Fibrillation Risk Factors:  she does have symptoms or diagnosis of sleep apnea. she does not have a history of rheumatic fever. she does not have a history of alcohol use. The patient does not have a history of early familial atrial fibrillation or other arrhythmias.  she has a BMI of Body mass index is 42.62 kg/m.Marland Kitchen Filed Weights   10/01/19 0930  Weight: 105.7 kg    Family History  Problem  Relation Age of Onset  . Hypertension Sister   . Glaucoma Sister      Atrial Fibrillation Management history:  Previous antiarrhythmic drugs: none Previous cardioversions: none Previous ablations: none CHADS2VASC score: 4 Anticoagulation history: none   Past Medical History:  Diagnosis Date  . AKI (acute kidney injury) ([place]) 12/2015  . Allergy    takes Singulair and Zyrtec daily  . Asthma    uses Adair daily  . Cataracts, bilateral   . Colitis, ulcerative ([place])   . DJD (degenerative joint disease)   . Glaucoma    uses eye drops daily  . History of blood transfusion    no abnormal reaction noted. 40+ yrs ago  . History of bronchitis    yrs ago  . History of gout    not on any meds  . Hypertension    takes Amlodipine daily  . Joint pain   . Joint swelling   . PMB (postmenopausal bleeding)   . Psoriasis   . Urinary frequency    takes Ditropan daily  . Vertigo    doesn't take any meds  . Vitamin D deficiency    Past Surgical History:  Procedure Laterality Date  . BACK SURGERY    . COLONOSCOPY    . DILATATION & CURETTAGE/HYSTEROSCOPY WITH MYOSURE N/A 08/05/2019   Procedure: DILATATION & CURETTAGE/HYSTEROSCOPY WITH MYOSURE;  Surgeon: Christophe Louis, MD;  Location: [facility];  Service: Gynecology;  Laterality: N/A;  Myosure rep will be here confirmed on 08/01/19 CS  . ILEOSTOMY    . KNEE ARTHROPLASTY Left 12/14/2015   Procedure: COMPUTER ASSISTED LEFT TOTAL KNEE ARTHROPLASTY;  Surgeon: Jessy Oto, MD;  Location: [place];  Service: Orthopedics;  Laterality: Left;  .  TOTAL KNEE ARTHROPLASTY Left 12/14/2015  . TOTAL KNEE ARTHROPLASTY Right 06/27/2016  . TOTAL KNEE ARTHROPLASTY Right 06/27/2016   Procedure: RIGHT TOTAL KNEE ARTHROPLASTY;  Surgeon: Jessy Oto, MD;  Location: [place];  Service: Orthopedics;  Laterality: Right;  . TUBAL LIGATION      Current Outpatient Medications  Medication Sig Dispense Refill  . albuterol (PROAIR HFA) 108 (90 Base) MCG/ACT inhaler INHALE 2  PUFFS INTO THE LUNGS EVERY 6 HOURS AS NEEDED FOR WHEEZING OR SHORTNESS OF BREATH 8.5 g 6  . allopurinol (ZYLOPRIM) 100 MG tablet TAKE 1 TABLET(100 MG) BY MOUTH DAILY (Patient taking differently: Take 100 mg by mouth as needed. ) 30 tablet 1  . brimonidine (ALPHAGAN) 0.2 % ophthalmic solution Place 1 drop into the right eye 3 (three) times daily.    . brinzolamide (AZOPT) 1 % ophthalmic suspension Place 1 drop into the right eye 3 (three) times daily.     . cetirizine (ZYRTEC) 10 MG tablet TAKE 1 TABLET(10 MG) BY MOUTH DAILY (Patient taking differently: Take 10 mg by mouth daily. ) 30 tablet 2  . colchicine 0.6 MG tablet Take 2 tabs (1.2 mg) at the onset of a gout attack, may repeat 1 tab (0.6 mg) 2 hours later if symptoms persist. 30 tablet 1  . diltiazem (CARDIZEM CD) 300 MG 24 hr capsule Take 1 capsule (300 mg total) by mouth daily. 30 capsule 3  . fluocinonide ointment (LIDEX) 4.66 % Apply 1 application topically 2 (two) times daily as needed for itching.    . fluticasone (FLOVENT HFA) 110 MCG/ACT inhaler Inhale 2 puffs into the lungs 2 (two) times daily. 1 Inhaler 6  . furosemide (LASIX) 20 MG tablet TAKE 1 TABLET BY MOUTH DAILY FOR 3 DAYS THEN AS NEEDED FOR SWELLING OR WEIGHT GAIN (Patient taking differently: as needed. TAKE 1 TABLET BY MOUTH DAILY FOR 3 DAYS THEN AS NEEDED FOR SWELLING OR WEIGHT GAIN) 30 tablet 1  . LUMIGAN 0.01 % SOLN Place 1 drop into both eyes at bedtime.    . medroxyPROGESTERone (PROVERA) 10 MG tablet Take 2 tablets (20 mg total) by mouth daily. 60 tablet 0  . potassium chloride (KLOR-CON) 10 MEQ tablet TAKE 1 TABLET BY MOUTH DAILY FOR 3 DAYS THEN AS NEEDED WHEN LASIX TAKEN (Patient taking differently: as needed. TAKE 1 TABLET BY MOUTH DAILY FOR 3 DAYS THEN AS NEEDED WHEN LASIX TAKEN) 90 tablet 0  . prochlorperazine (COMPAZINE) 10 MG tablet TAKE 1 TABLET(10 MG) BY MOUTH TWICE DAILY AS NEEDED FOR NAUSEA OR VOMITING (Patient taking differently: Take 10 mg by mouth 2 (two) times  daily as needed for nausea or vomiting. ) 30 tablet 0   Current Facility-Administered Medications  Medication Dose Route Frequency Provider Last Rate Last Admin  . indomethacin (INDOCIN) capsule 25 mg  25 mg Oral BID WC Jessy Oto, MD        Allergies  Allergen Reactions  . Megace Es [Megestrol Acetate] Shortness Of Breath  . Sulfonamide Derivatives Rash    Social History   Socioeconomic History  . Marital status: Single    Spouse name: Not on file  . Number of children: Not on file  . Years of education: Not on file  . Highest education level: Not on file  Occupational History  . Not on file  Tobacco Use  . Smoking status: Never Smoker  . Smokeless tobacco: Never Used  Substance and Sexual Activity  . Alcohol use: No    Alcohol/week: 0.0  standard drinks  . Drug use: No  . Sexual activity: Not Currently    Birth control/protection: Post-menopausal  Other Topics Concern  . Not on file  Social History Narrative  . Not on file   Social Determinants of Health   Financial Resource Strain:   . Difficulty of Paying Living Expenses: Not on file  Food Insecurity:   . Worried About Charity fundraiser in the Last Year: Not on file  . Ran Out of Food in the Last Year: Not on file  Transportation Needs:   . Lack of Transportation (Medical): Not on file  . Lack of Transportation (Non-Medical): Not on file  Physical Activity:   . Days of Exercise per Week: Not on file  . Minutes of Exercise per Session: Not on file  Stress:   . Feeling of Stress : Not on file  Social Connections:   . Frequency of Communication with Friends and Family: Not on file  . Frequency of Social Gatherings with Friends and Family: Not on file  . Attends Religious Services: Not on file  . Active Member of Clubs or Organizations: Not on file  . Attends Archivist Meetings: Not on file  . Marital Status: Not on file  Intimate Partner Violence:   . Fear of Current or Ex-Partner: Not on  file  . Emotionally Abused: Not on file  . Physically Abused: Not on file  . Sexually Abused: Not on file     ROS- All systems are reviewed and negative except as per the HPI above.  Physical Exam: Vitals:   10/01/19 0930  BP: (!) 186/72  Pulse: (!) 142  Weight: 105.7 kg  Height: 5' 2"  (1.575 m)    GEN- The patient is well appearing obese elderly female, alert and oriented x 3 today.   HEENT-head normocephalic, atraumatic, sclera clear, conjunctiva pink, hearing intact, trachea midline. Lungs- Clear to ausculation bilaterally, normal work of breathing Heart- irregular rate and rhythm, no murmurs, rubs or gallops  GI- soft, NT, ND, + BS Extremities- no clubbing, cyanosis. 1+ bilateral edema MS- no significant deformity or atrophy Skin- no rash or lesion Psych- euthymic mood, full affect Neuro- strength and sensation are intact   Wt Readings from Last 3 Encounters:  10/01/19 105.7 kg  08/29/19 108.3 kg  08/15/19 107.3 kg    EKG today demonstrates atrial tachycardia with variable rates HR 142, PVCs, QRS 72, QTc 446  Echo 08/02/19 demonstrated   1. Left ventricular ejection fraction, by visual estimation, is 55 to 60%. The left ventricle has normal function. There is mildly increased left ventricular hypertrophy.  2. The left ventricle has no regional wall motion abnormalities.  3. Global right ventricle has normal systolic function.The right ventricular size is normal. No increase in right ventricular wall thickness.  4. Left atrial size was normal.  5. Right atrial size was normal.  6. The mitral valve is normal in structure. Trace mitral valve regurgitation. No evidence of mitral stenosis.  7. The tricuspid valve is normal in structure. Tricuspid valve regurgitation is mild.  8. The aortic valve is normal in structure. Aortic valve regurgitation is not visualized. Mild aortic valve sclerosis without stenosis.  9. There is Mild calcification of the aortic valve. 10.  There is Mild thickening of the aortic valve. 11. The pulmonic valve was grossly normal. Pulmonic valve regurgitation is not visualized. 12. Moderately elevated pulmonary artery systolic pressure. 13. The inferior vena cava is dilated in size with <50% respiratory  variability, suggesting right atrial pressure of 15 mmHg.  Epic records are reviewed at length today  Assessment and Plan:  1. Persistent atrial fibrillation/Atach ?Possibly related to anemia with postmenopausal bleeding.  Per Dr Sundra Aland office visit note 08/19/19, anticoagulation is not recommended until uterine bleeding resolved. Plans for GYN oncology appointment 1/25. We are limited to rate control for now. Patient is in atrial tachycardia today.  Increase diltiazem to 300 mg daily  Lifestyle changes as below.  This patients CHA2DS2-VASc Score and unadjusted Ischemic Stroke Rate (% per year) is equal to 4.8 % stroke rate/year from a score of 4  Above score calculated as 1 point each if present [CHF, HTN, DM, Vascular=MI/PAD/Aortic Plaque, Age if 65-74, or Female] Above score calculated as 2 points each if present [Age > 75, or Stroke/TIA/TE]   2. Obesity Body mass index is 42.62 kg/m. Lifestyle modification was discussed and encouraged including regular physical activity and weight reduction.  3. Snoring/witness apnea/daytime somnolence The importance of adequate treatment of sleep apnea was discussed today in order to improve our ability to maintain sinus rhythm long term. Sleep study ordered.  4. HTN Elevated today, med changes as above.  5. Lower extremity edema Improved, continue PRN lasix and K+.   Patient to call clinic with update on heart rate and BP at the end of the week. Follow up in the AF clinic in one month.   [facility] [address] [place], [place] [address] [phone] 10/02/2019 8:51 AM

## 2019-10-01 NOTE — Patient Instructions (Signed)
Increase cardizem to 326m once a day  Call on Friday with update of blood pressure/heart rate

## 2019-10-02 ENCOUNTER — Other Ambulatory Visit: Payer: Self-pay | Admitting: Family Medicine

## 2019-10-02 ENCOUNTER — Other Ambulatory Visit (HOSPITAL_COMMUNITY): Payer: Self-pay | Admitting: Physician Assistant

## 2019-10-02 DIAGNOSIS — J4541 Moderate persistent asthma with (acute) exacerbation: Secondary | ICD-10-CM

## 2019-10-02 DIAGNOSIS — I471 Supraventricular tachycardia: Secondary | ICD-10-CM | POA: Insufficient documentation

## 2019-10-02 NOTE — Addendum Note (Signed)
Encounter addended by: Oliver Barre, PA on: 10/02/2019 8:55 AM  Actions taken: Clinical Note Signed

## 2019-10-03 NOTE — Progress Notes (Signed)
Consult Note: Gyn-Onc  Consult was requested by Dr. Landry Mellow for the evaluation of Jackie Parker 79 y.o. female  CC:  Chief Complaint  Patient presents with  . PMB (postmenopausal bleeding)    Assessment/Plan:  Jackie Parker  is a 79 y.o.  year old with postmenopausal bleeding in the setting of atrial fibrillation and morbid obesity. Not on anticoagulation. Non-diagnostic workup with endometrial biopsy and hysteroscopy D&C.  Recommendation is for consideration of hysterectomy for diagnostic purposes. I would recommend robotic assisted total hysterectomy with BSO. We discussed risks of the procedure including  bleeding, infection, damage to internal organs (such as bladder,ureters, bowels), blood clot, reoperation and rehospitalization. I discussed that her morbid obesity (BMI 43) and her prior complex surgical history (including total colectomy and ileostomy formation) put her at especially high risk for complications including damage to internal organs, conversion to laparotomy, damage to/revision of ileostomy. I also explained that she was at a higher risk for VTE including stroke due to her atrial fibrillation.   Due to the particularly high risk for her needing hospitalization post-op and the lack of inpatient beds currently due to the [place], I am recommending considering surgery at the end of February, 2021 when the bed availability is likely greater.   HPI: Jackie Parker is a 79 year old P5 who was seen in consultation at the request of Dr Landry Mellow for evaluation of postmenopausal bleeding in the setting of obesity and a complex prior surgical history.  She has a history of postmenopausal bleeding at age 28 which was treated with an IUD (progestin releasing). It was removed in December,  2019. The patient reported that her post-menopausal bleeding began in June/July, 2020.   She underwent TVUS on 04/17/19 which showed a uterus measuring 10.9 x 6.8 x 8.6 cm. Heterogeneous  myometrium with scattered areas of shadowing, few tiny myometrial cysts, and minimally asymmetric anterior thickening highly suggestive of adenomyosis. Peripheral artery calcifications are noted. Posterior submucosal mass 2.2 x 2.2 x 2.8 cm at upper uterus consistent with leiomyoma. The endometrial thickness was 13 mm with a heterogeneous appearance. The right ovary measured 4.9 x 2.0 x 1.2 cm with normal morphology. The left ovary measured 4.1 x 1.3 x 1.6 cm with normal morphology  She underwent office biopsy on 07/12/19 which showed rare fragments of benign endometrium.  The patient continued to bleed and developed anemia to an Hb of 12m/dL. She was taken to the OR on 08/05/19 for an attempted hysteroscopy D&C. Dr CLandry Mellowplaced the hysteroscopy and performed myosure of the lining but could not complete sharp curette due to bleeding from the cervix. Pathology from this specimen revealed fibromuscular tissue devoid of overlying endometrium. Unremarkable endocervical glandular and squamous mucosa.  She was prescribed 235mprovera to take BID. After beginning the Provera, she stopped having vaginal bleeding however developed severe SOB on exertion.  Dr CoLandry Mellowid not feel comfortable proceding with a diagnostic hysterectomy due to the complexity of the patient's case.   The patient's past medical history is significant for morbid obesity with a BMI of 43 kg/m2.  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  She has had a tubal ligation prior to her colectomy and back and neck surgeries.  Her gynecologic history is significant for 5 vaginal deliveries and no history of abnormal pap smears.    She has been seen by cardiology, most recently in January, 2021. She was  noted to be in sinus rhythm, rate controlled, but not on anticoagulation due to her untreated postmenopausal bleeding.  Her last echocardiogram was on 08/02/19. It showed normal  EF of 55-60%, moderately elevated PA systolic pressure.   Current Meds:  Outpatient Encounter Medications as of 10/07/2019  Medication Sig  . allopurinol (ZYLOPRIM) 100 MG tablet TAKE 1 TABLET(100 MG) BY MOUTH DAILY (Patient taking differently: Take 100 mg by mouth as needed. )  . brimonidine (ALPHAGAN) 0.2 % ophthalmic solution Place 1 drop into the right eye 3 (three) times daily.  . brinzolamide (AZOPT) 1 % ophthalmic suspension Place 1 drop into the right eye 3 (three) times daily.   . cetirizine (ZYRTEC) 10 MG tablet TAKE 1 TABLET(10 MG) BY MOUTH DAILY (Patient taking differently: Take 10 mg by mouth daily. )  . colchicine 0.6 MG tablet Take 2 tabs (1.2 mg) at the onset of a gout attack, may repeat 1 tab (0.6 mg) 2 hours later if symptoms persist.  . diltiazem (CARDIZEM CD) 300 MG 24 hr capsule Take 1 capsule (300 mg total) by mouth daily.  Marland Kitchen FLOVENT HFA 110 MCG/ACT inhaler INHALE 2 PUFFS INTO THE LUNGS TWICE DAILY  . fluocinonide ointment (LIDEX) 9.62 % Apply 1 application topically 2 (two) times daily as needed for itching.  . furosemide (LASIX) 20 MG tablet TAKE 1 TABLET BY MOUTH DAILY FOR 3 DAYS THEN AS NEEDED FOR SWELLING OR WEIGHT GAIN  . LUMIGAN 0.01 % SOLN Place 1 drop into both eyes at bedtime.  . potassium chloride (KLOR-CON) 10 MEQ tablet TAKE 1 TABLET BY MOUTH DAILY FOR 3 DAYS THEN AS NEEDED WHEN LASIX TAKEN (Patient taking differently: as needed. TAKE 1 TABLET BY MOUTH DAILY FOR 3 DAYS THEN AS NEEDED WHEN LASIX TAKEN)  . PROAIR HFA 108 (90 Base) MCG/ACT inhaler INHALE 2 PUFFS INTO THE LUNGS EVERY 6 HOURS AS NEEDED FOR WHEEZING OR SHORTNESS OF BREATH  . prochlorperazine (COMPAZINE) 10 MG tablet TAKE 1 TABLET(10 MG) BY MOUTH TWICE DAILY AS NEEDED FOR NAUSEA OR VOMITING (Patient taking differently: Take 10 mg by mouth 2 (two) times daily as needed for nausea or vomiting. )  . megestrol (MEGACE) 40 MG tablet Take 1 tablet (40 mg total) by mouth 2 (two) times daily.  . [DISCONTINUED]  albuterol (PROAIR HFA) 108 (90 Base) MCG/ACT inhaler INHALE 2 PUFFS INTO THE LUNGS EVERY 6 HOURS AS NEEDED FOR WHEEZING OR SHORTNESS OF BREATH  . [DISCONTINUED] fluticasone (FLOVENT HFA) 110 MCG/ACT inhaler Inhale 2 puffs into the lungs 2 (two) times daily.  . [DISCONTINUED] medroxyPROGESTERone (PROVERA) 10 MG tablet Take 2 tablets (20 mg total) by mouth daily.   Facility-Administered Encounter Medications as of 10/07/2019  Medication  . indomethacin (INDOCIN) capsule 25 mg    Allergy:  Allergies  Allergen Reactions  . Megace Es [Megestrol Acetate] Shortness Of Breath  . Sulfonamide Derivatives Rash    Social Hx:   Social History   Socioeconomic History  . Marital status: Single    Spouse name: Not on file  . Number of children: Not on file  . Years of education: Not on file  . Highest education level: Not on file  Occupational History  . Not on file  Tobacco Use  . Smoking status: Never Smoker  . Smokeless tobacco: Never Used  Substance and Sexual Activity  . Alcohol use: No    Alcohol/week: 0.0 standard drinks  . Drug use: No  . Sexual activity: Not Currently    Birth control/protection: Post-menopausal  Other Topics Concern  . Not on file  Social History Narrative  . Not on file   Social Determinants of Health   Financial Resource Strain:   . Difficulty of Paying Living Expenses: Not on file  Food Insecurity:   . Worried About Charity fundraiser in the Last Year: Not on file  . Ran Out of Food in the Last Year: Not on file  Transportation Needs:   . Lack of Transportation (Medical): Not on file  . Lack of Transportation (Non-Medical): Not on file  Physical Activity:   . Days of Exercise per Week: Not on file  . Minutes of Exercise per Session: Not on file  Stress:   . Feeling of Stress : Not on file  Social Connections:   . Frequency of Communication with Friends and Family: Not on file  . Frequency of Social Gatherings with Friends and Family: Not on file   . Attends Religious Services: Not on file  . Active Member of Clubs or Organizations: Not on file  . Attends Archivist Meetings: Not on file  . Marital Status: Not on file  Intimate Partner Violence:   . Fear of Current or Ex-Partner: Not on file  . Emotionally Abused: Not on file  . Physically Abused: Not on file  . Sexually Abused: Not on file    Past Surgical Hx:  Past Surgical History:  Procedure Laterality Date  . BACK SURGERY    . COLONOSCOPY    . DILATATION & CURETTAGE/HYSTEROSCOPY WITH MYOSURE N/A 08/05/2019   Procedure: DILATATION & CURETTAGE/HYSTEROSCOPY WITH MYOSURE;  Surgeon: Christophe Louis, MD;  Location: [facility];  Service: Gynecology;  Laterality: N/A;  Myosure rep will be here confirmed on 08/01/19 CS  . ILEOSTOMY    . KNEE ARTHROPLASTY Left 12/14/2015   Procedure: COMPUTER ASSISTED LEFT TOTAL KNEE ARTHROPLASTY;  Surgeon: Jessy Oto, MD;  Location: [facility];  Service: Orthopedics;  Laterality: Left;  . TOTAL COLECTOMY      total colectomy in 1977  . TOTAL KNEE ARTHROPLASTY Left 12/14/2015  . TOTAL KNEE ARTHROPLASTY Right 06/27/2016  . TOTAL KNEE ARTHROPLASTY Right 06/27/2016   Procedure: RIGHT TOTAL KNEE ARTHROPLASTY;  Surgeon: Jessy Oto, MD;  Location: [facility];  Service: Orthopedics;  Laterality: Right;  . TUBAL LIGATION      Past Medical Hx:  Past Medical History:  Diagnosis Date  . AKI (acute kidney injury) ([place]) 12/2015  . Allergy    takes Singulair and Zyrtec daily  . Asthma    uses Adair daily  . Cataracts, bilateral   . Colitis, ulcerative ([place])   . DJD (degenerative joint disease)   . Glaucoma    uses eye drops daily  . History of blood transfusion    no abnormal reaction noted. 40+ yrs ago  . History of bronchitis    yrs ago  . History of gout    not on any meds  . Hypertension    takes Amlodipine daily  . Joint pain   . Joint swelling   . PMB (postmenopausal bleeding)   . Psoriasis   . Urinary frequency    takes Ditropan daily  .  Vertigo    doesn't take any meds  . Vitamin D deficiency     Past Gynecological History:  See HPI No LMP recorded. Patient is postmenopausal.  Family Hx:  Family History  Problem Relation Age of Onset  . Hypertension Sister   . Glaucoma Sister   . Endometrial  cancer Neg Hx   . Ovarian cancer Neg Hx     Review of Systems:  Constitutional  Feels fatigued, Increased work of breathing  ENT Normal appearing ears and nares bilaterally Skin/Breast  Psoriatic rash covering distal lower extremities.  Cardiovascular  + SOB Pulmonary  + wheeze Gastro Intestinal  No nausea, vomitting, or diarrhoea. No bright red blood per rectum, no abdominal pain, change in bowel movement, or constipation. Has ileostomy Genito Urinary  No frequency, urgency, dysuria, no bleeding. Musculo Skeletal  + back pain Neurologic  No weakness, numbness, change in gait,  Psychology  No depression, anxiety, insomnia.   Vitals:  Blood pressure (!) 148/70, pulse (!) 142, temperature 98.7 F (37.1 C), temperature source Temporal, height 5' 2"  (1.575 m), weight 235 lb 4.8 oz (106.7 kg), SpO2 99 %. BMI 43kg/m2.  Physical Exam: Appears uncomfortable with breathing at rest Neck  Supple NROM, without any enlargements.  Lymph Node Survey No cervical supraclavicular or inguinal adenopathy Cardiovascular  Irregularly irregular rhythm with rapid rate.  Lungs  + crackles and wheezes.  Skin  + rash on bilateral lower extremities (psoriatic) Psychiatry  Alert and oriented to person, place, and time  Abdomen  Normoactive bowel sounds, abdomen soft, non-tender and obese without evidence of hernia. Ileostomy functioning (right mid abdomen).  Back No CVA tenderness Genito Urinary  Vulva/vagina: Normal external female genitalia.   No lesions. No discharge or bleeding.  Bladder/urethra:  No lesions or masses, well supported bladder  Vagina: normal, small amount of blood  Cervix: Normal appearing, no  lesions.  Uterus: Bulky, firm, enlarged, minimally mobile, no parametrial involvement or nodularity.  Adnexa: unable to discretely palpate masses but exam limited due to body habitus.  Rectal  Good tone, no masses no cul de sac nodularity.  Extremities  Shiny skin on lower extremities consistent with vascular disease.    Thereasa Solo, MD  10/08/2019, 4:50 PM

## 2019-10-04 ENCOUNTER — Telehealth (HOSPITAL_COMMUNITY): Payer: Self-pay | Admitting: *Deleted

## 2019-10-04 NOTE — Telephone Encounter (Signed)
Patient called in with update of HR/BP after increased cardizem earlier this week. BP running 132/77 HR 89.

## 2019-10-07 ENCOUNTER — Other Ambulatory Visit (HOSPITAL_COMMUNITY)
Admission: RE | Admit: 2019-10-07 | Discharge: 2019-10-07 | Disposition: A | Discharge status: Home / Self Care | Payer: Medicare Other | Source: Ambulatory Visit | Attending: Gynecologic Oncology | Admitting: Gynecologic Oncology

## 2019-10-07 ENCOUNTER — Inpatient Hospital Stay: Payer: Medicare Other

## 2019-10-07 ENCOUNTER — Encounter: Payer: Self-pay | Admitting: Gynecologic Oncology

## 2019-10-07 ENCOUNTER — Other Ambulatory Visit: Payer: Self-pay

## 2019-10-07 ENCOUNTER — Inpatient Hospital Stay: Payer: Medicare Other | Attending: Gynecologic Oncology | Admitting: Gynecologic Oncology

## 2019-10-07 VITALS — BP 148/70 | HR 142 | Temp 98.7°F | Ht 62.0 in | Wt 235.3 lb

## 2019-10-07 DIAGNOSIS — N858 Other specified noninflammatory disorders of uterus: Secondary | ICD-10-CM

## 2019-10-07 DIAGNOSIS — N950 Postmenopausal bleeding: Secondary | ICD-10-CM | POA: Insufficient documentation

## 2019-10-07 DIAGNOSIS — Z1151 Encounter for screening for human papillomavirus (HPV): Secondary | ICD-10-CM | POA: Diagnosis not present

## 2019-10-07 LAB — BASIC METABOLIC PANEL
Anion gap: 8 (ref 5–15)
BUN: 11 mg/dL (ref 8–23)
CO2: 26 mmol/L (ref 22–32)
Calcium: 8 mg/dL — ABNORMAL LOW (ref 8.9–10.3)
Chloride: 108 mmol/L (ref 98–111)
Creatinine, Ser: 1.66 mg/dL — ABNORMAL HIGH (ref 0.44–1.00)
GFR calc Af Amer: 34 mL/min — ABNORMAL LOW (ref >60)
GFR calc non Af Amer: 29 mL/min — ABNORMAL LOW (ref >60)
Glucose, Bld: 115 mg/dL — ABNORMAL HIGH (ref 70–99)
Potassium: 3.1 mmol/L — ABNORMAL LOW (ref 3.5–5.1)
Sodium: 142 mmol/L (ref 135–145)

## 2019-10-07 MED ORDER — MEGESTROL ACETATE 40 MG PO TABS: 40.0000 mg | ORAL_TABLET | Freq: Two times a day (BID) | ORAL | 11 refills | Status: DC

## 2019-10-07 NOTE — Patient Instructions (Addendum)
Dr Denman George is recommending considering hysterectomy to stop your bleeding symptoms and to ensure there is no cancer in the uterus. However, it is not safe to proceed with a hysterectomy at present due to the potential need for an inpatient bed after surgery (none are available at he hospital at present).  Therefore, Dr Denman George will see you back at the [facility] in mid-February to discuss availability of the OR and plan a hysterectomy at that time.  She will obtain a CT scan of the abdomen and pelvis to evaluate for other organ abnormalities.   We will also contact you with the results of your pap smear from today.   We will also reach out to cardiology for you to be evaluated again given your symptoms.  Dr Denman George has changed your progesterone tablets. Please stop taking your provera and start taking the megace (1 tablet twice a day). If your bleeding becomes heavy on this or your breathing worse, notify Dr Denman George at [phone].

## 2019-10-08 ENCOUNTER — Encounter: Payer: Self-pay | Admitting: Gynecologic Oncology

## 2019-10-08 ENCOUNTER — Telehealth: Payer: Self-pay | Admitting: *Deleted

## 2019-10-08 NOTE — Telephone Encounter (Signed)
Pt notified of Creatinine and Potassium levels. Will notify PCP.

## 2019-10-09 ENCOUNTER — Telehealth (HOSPITAL_COMMUNITY): Payer: Self-pay | Admitting: Physician Assistant

## 2019-10-09 ENCOUNTER — Telehealth: Payer: Self-pay

## 2019-10-09 LAB — CYTOLOGY - PAP
Comment: NEGATIVE
Diagnosis: NEGATIVE
High risk HPV: NEGATIVE

## 2019-10-09 NOTE — Telephone Encounter (Deleted)
-----   Message from Everitt Amber, MD sent at 10/08/2019  5:00 PM EST ----- Dear Berton Mount, Ms Newstrom did not look good when we saw her. She is definitely not a surgical candidate. Even if she has a cancer (and we do not have evidence that she does), she would require treatment with medication (progesterone) and/or radiation. She was not rate controlled (heart rate 140) and very short of breath.I am concerned that she has heart failure from her atrial fibrillation. Are you able to see her to see if she needs intervention. As for anticoagulation, I understand this is important for stroke risk, however, I am concerned about it causing bad bleeding (which we cannot control with surgery due to her poor medical fitness for major surgery) and therefore I feel that the risk of bleeding right now overwhelms the risk of stroke.  One concern I have is that the progesterone she is taking is causing her shortness of breath (or adding to the effects of the uncontrolled atrial fibrillation). Progesterone can cause SOB. However, it is the only thing stopping her from bleeding. I am trying a different progestational agent for her to see if this improves her breathing.  Would you mind seeing her again to optimize her cardiovascular status? I am going to see her back in February, and if her A fib is rate controlled and her breathing appears better, we will contemplate her fitness for a hysterectomy. If she remains in as poor a state as she was in today, I won't be able to contemplate a surgery for her.  Thanks so much, Luna Fuse: [phone] ----- Message ----- From: Oliver Barre, PA Sent: 10/08/2019  10:55 AM EST To: Everitt Amber, MD  Hi Dr Denman George,  Samara Deist, one of the providers in the [facility] taking care of Ms Nordling. So far, we have not initiated anticoagulation for afib due to her postmenopausal bleeding per the recommendation from Dr Landry Mellow. Once cleared by you, I would like to start her on anticoagulation.   If  you have any questions or if I can be of any assistance, don't hesitate to reach out. Our number is [phone].  Thanks!  Adline Peals PA

## 2019-10-09 NOTE — Telephone Encounter (Signed)
Discussed with Dr Denman George and patient is not currently a surgical candidate for hysterectomy with current cardiac status. There was concern for heart failure given her rapid arrhythmias. Called and offered Afib clinic appointment to patient but she declined stating that her heart felt fine today. She is scheduled to come in on Monday 10/14/2119.

## 2019-10-09 NOTE — Telephone Encounter (Signed)
Told Jackie Parker that her pap smear was normal and HPV negative. Pt verbalized understanding.

## 2019-10-09 NOTE — Telephone Encounter (Signed)
Jackie Jackie Parker called stating that she wants to cancel the CT scan on 10-14-19.  Her body needs to rest. There are things going on at her home as well which she would not divulge. Reviewed with Melissa Cross,NP. Jackie Parker needs to call Dr. Serita Grit office when she feels up to r/s CT scan. Will also cancel f/u appointment with Dr. Denman George on 10-23-19 and will r/s appointment after CT scan. Pt verbalized understanding.

## 2019-10-11 ENCOUNTER — Ambulatory Visit (HOSPITAL_COMMUNITY): Payer: Medicare Other

## 2019-10-14 ENCOUNTER — Other Ambulatory Visit: Payer: Self-pay

## 2019-10-14 ENCOUNTER — Encounter (HOSPITAL_COMMUNITY): Payer: Self-pay | Admitting: Physician Assistant

## 2019-10-14 ENCOUNTER — Ambulatory Visit (HOSPITAL_COMMUNITY): Payer: Medicare Other

## 2019-10-14 ENCOUNTER — Other Ambulatory Visit (HOSPITAL_COMMUNITY): Payer: Self-pay | Admitting: Physician Assistant

## 2019-10-14 ENCOUNTER — Ambulatory Visit (HOSPITAL_COMMUNITY)
Admission: RE | Admit: 2019-10-14 | Discharge: 2019-10-14 | Disposition: A | Discharge status: Home / Self Care | Payer: Medicare Other | Source: Ambulatory Visit | Attending: Physician Assistant | Admitting: Physician Assistant

## 2019-10-14 VITALS — BP 144/70 | HR 115 | Ht 62.0 in | Wt 238.0 lb

## 2019-10-14 DIAGNOSIS — L409 Psoriasis, unspecified: Secondary | ICD-10-CM | POA: Insufficient documentation

## 2019-10-14 DIAGNOSIS — Z882 Allergy status to sulfonamides status: Secondary | ICD-10-CM | POA: Insufficient documentation

## 2019-10-14 DIAGNOSIS — R400 Somnolence: Secondary | ICD-10-CM | POA: Diagnosis not present

## 2019-10-14 DIAGNOSIS — R0683 Snoring: Secondary | ICD-10-CM | POA: Insufficient documentation

## 2019-10-14 DIAGNOSIS — I471 Supraventricular tachycardia: Secondary | ICD-10-CM | POA: Diagnosis not present

## 2019-10-14 DIAGNOSIS — Z9049 Acquired absence of other specified parts of digestive tract: Secondary | ICD-10-CM | POA: Insufficient documentation

## 2019-10-14 DIAGNOSIS — I10 Essential (primary) hypertension: Secondary | ICD-10-CM | POA: Diagnosis not present

## 2019-10-14 DIAGNOSIS — Z79899 Other long term (current) drug therapy: Secondary | ICD-10-CM | POA: Diagnosis not present

## 2019-10-14 DIAGNOSIS — J45909 Unspecified asthma, uncomplicated: Secondary | ICD-10-CM | POA: Diagnosis not present

## 2019-10-14 DIAGNOSIS — Z8249 Family history of ischemic heart disease and other diseases of the circulatory system: Secondary | ICD-10-CM | POA: Diagnosis not present

## 2019-10-14 DIAGNOSIS — R600 Localized edema: Secondary | ICD-10-CM | POA: Insufficient documentation

## 2019-10-14 DIAGNOSIS — Z6841 Body Mass Index (BMI) 40.0 and over, adult: Secondary | ICD-10-CM | POA: Insufficient documentation

## 2019-10-14 DIAGNOSIS — Z96653 Presence of artificial knee joint, bilateral: Secondary | ICD-10-CM | POA: Insufficient documentation

## 2019-10-14 DIAGNOSIS — R350 Frequency of micturition: Secondary | ICD-10-CM | POA: Insufficient documentation

## 2019-10-14 DIAGNOSIS — I4891 Unspecified atrial fibrillation: Secondary | ICD-10-CM | POA: Diagnosis present

## 2019-10-14 DIAGNOSIS — I4819 Other persistent atrial fibrillation: Secondary | ICD-10-CM | POA: Diagnosis not present

## 2019-10-14 DIAGNOSIS — E559 Vitamin D deficiency, unspecified: Secondary | ICD-10-CM | POA: Insufficient documentation

## 2019-10-14 DIAGNOSIS — E669 Obesity, unspecified: Secondary | ICD-10-CM | POA: Diagnosis not present

## 2019-10-14 DIAGNOSIS — D6869 Other thrombophilia: Secondary | ICD-10-CM

## 2019-10-14 DIAGNOSIS — Z888 Allergy status to other drugs, medicaments and biological substances status: Secondary | ICD-10-CM | POA: Insufficient documentation

## 2019-10-14 LAB — BASIC METABOLIC PANEL
Anion gap: 11 (ref 5–15)
BUN: 8 mg/dL (ref 8–23)
CO2: 21 mmol/L — ABNORMAL LOW (ref 22–32)
Calcium: 8.5 mg/dL — ABNORMAL LOW (ref 8.9–10.3)
Chloride: 110 mmol/L (ref 98–111)
Creatinine, Ser: 1.35 mg/dL — ABNORMAL HIGH (ref 0.44–1.00)
GFR calc Af Amer: 43 mL/min — ABNORMAL LOW (ref >60)
GFR calc non Af Amer: 38 mL/min — ABNORMAL LOW (ref >60)
Glucose, Bld: 85 mg/dL (ref 70–99)
Potassium: 3.6 mmol/L (ref 3.5–5.1)
Sodium: 142 mmol/L (ref 135–145)

## 2019-10-14 MED ORDER — FUROSEMIDE 20 MG PO TABS: ORAL_TABLET | ORAL | 1 refills | Status: DC

## 2019-10-14 MED ORDER — POTASSIUM CHLORIDE ER 10 MEQ PO TBCR: 20.0000 meq | EXTENDED_RELEASE_TABLET | Freq: Every day | ORAL | 2 refills | Status: DC

## 2019-10-14 MED ORDER — AMIODARONE HCL 200 MG PO TABS: ORAL_TABLET | ORAL | 0 refills | Status: DC

## 2019-10-14 NOTE — Progress Notes (Addendum)
Primary Care Physician: [name], MD Primary Cardiologist: none Primary Electrophysiologist: none Referring Physician: [name] is a 79 y.o. 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 Patient was brought to the ED due to atrial fibrillation with rapid ventricular response from a preop appointment for an D&C to treat abnormal uterine bleeding. EKG on arrival showed atrial fibrillation with RVR, with HR in the 180s. Patient was rate controlled with diltiazem. She underwent D&C during her admission. Per GYN recommendations, she was not started on anticoagulation for a CHADS2VASC score of 4. She does admit to snoring, witnessed apnea, and daytime somnolence. She denies significant alcohol use.   On follow up today, patient reports that she has had more heart racing symptoms. Her weight has increased as well although she denies increased edema, SOB, or orthopnea. She appears to be in atrial tach today.  Today, she denies symptoms of chest pain, orthopnea, PND, dizziness, presyncope, syncope, or neurologic sequela. The patient is tolerating medications without difficulties and is otherwise without complaint today.    Atrial Fibrillation Risk Factors:  she does have symptoms or diagnosis of sleep apnea. she does not have a history of rheumatic fever. she does not have a history of alcohol use. The patient does not have a history of early familial atrial fibrillation or other arrhythmias.  she has a BMI of Body mass index is 43.53 kg/m.Marland Kitchen Filed Weights   10/14/19 1510  Weight: 108 kg    Family History  Problem Relation Age of Onset  . Hypertension Sister   . Glaucoma  Sister   . Endometrial cancer Neg Hx   . Ovarian cancer Neg Hx      Atrial Fibrillation Management history:  Previous antiarrhythmic drugs: none Previous cardioversions: none Previous ablations: none CHADS2VASC score: 4 Anticoagulation history: none   Past Medical History:  Diagnosis Date  . AKI (acute kidney injury) ([place]) 12/2015  . Allergy    takes Singulair and Zyrtec daily  . Asthma    uses Adair daily  . Cataracts, bilateral   . Colitis, ulcerative ([place])   . DJD (degenerative joint disease)   . Glaucoma    uses eye drops daily  . History of blood transfusion    no abnormal reaction noted. 40+ yrs ago  . History of bronchitis    yrs ago  . History of gout    not on any meds  . Hypertension    takes Amlodipine daily  . Joint pain   . Joint swelling   . PMB (postmenopausal bleeding)   . Psoriasis   . Urinary frequency    takes Ditropan daily  . Vertigo    doesn't take any meds  . Vitamin D deficiency    Past Surgical History:  Procedure Laterality Date  . BACK SURGERY    . COLONOSCOPY    . DILATATION & CURETTAGE/HYSTEROSCOPY WITH MYOSURE N/A 08/05/2019   Procedure: DILATATION & CURETTAGE/HYSTEROSCOPY WITH MYOSURE;  Surgeon: Christophe Louis, MD;  Location: [facility];  Service: Gynecology;  Laterality: N/A;  Myosure rep will be here confirmed on 08/01/19 CS  . ILEOSTOMY    . KNEE ARTHROPLASTY Left 12/14/2015   Procedure: COMPUTER ASSISTED LEFT TOTAL KNEE ARTHROPLASTY;  Surgeon: Jessy Oto, MD;  Location: [place];  Service: Orthopedics;  Laterality: Left;  . TOTAL COLECTOMY      total colectomy in 1977  . TOTAL KNEE ARTHROPLASTY Left 12/14/2015  . TOTAL KNEE ARTHROPLASTY Right 06/27/2016  . TOTAL KNEE ARTHROPLASTY Right 06/27/2016   Procedure: RIGHT TOTAL KNEE ARTHROPLASTY;  Surgeon: Jessy Oto, MD;  Location: [facility];  Service: Orthopedics;  Laterality: Right;  . TUBAL LIGATION      Current Outpatient Medications  Medication Sig Dispense Refill  . allopurinol  (ZYLOPRIM) 100 MG tablet TAKE 1 TABLET(100 MG) BY MOUTH DAILY (Patient taking differently: Take 100 mg by mouth as needed. ) 30 tablet 1  . brimonidine (ALPHAGAN) 0.2 % ophthalmic solution Place 1 drop into the right eye 3 (three) times daily.    . brinzolamide (AZOPT) 1 % ophthalmic suspension Place 1 drop into the right eye 3 (three) times daily.     . cetirizine (ZYRTEC) 10 MG tablet TAKE 1 TABLET(10 MG) BY MOUTH DAILY (Patient taking differently: Take 10 mg by mouth daily. ) 30 tablet 2  . colchicine 0.6 MG tablet Take 2 tabs (1.2 mg) at the onset of a gout attack, may repeat 1 tab (0.6 mg) 2 hours later if symptoms persist. 30 tablet 1  . diltiazem (CARDIZEM CD) 300 MG 24 hr capsule Take 1 capsule (300 mg total) by mouth daily. 30 capsule 3  . FLOVENT HFA 110 MCG/ACT inhaler INHALE 2 PUFFS INTO THE LUNGS TWICE DAILY 12 g 2  . fluocinonide ointment (LIDEX) 3.57 % Apply 1 application topically 2 (two) times daily as needed for itching.    . furosemide (LASIX) 20 MG tablet Take 2 tablets by mouth daily for 3 days then reduce to 1 tablet daily 90 tablet 1  . LUMIGAN 0.01 % SOLN Place 1 drop into both eyes at bedtime.    . megestrol (MEGACE) 40 MG tablet Take 1 tablet (40 mg total) by mouth 2 (two) times daily. 60 tablet 11  . potassium chloride (KLOR-CON) 10 MEQ tablet Take 2 tablets (20 mEq total) by mouth daily. 180 tablet 2  . PROAIR HFA 108 (90 Base) MCG/ACT inhaler INHALE 2 PUFFS INTO THE LUNGS EVERY 6 HOURS AS NEEDED FOR WHEEZING OR SHORTNESS OF BREATH 8.5 g 2  . prochlorperazine (COMPAZINE) 10 MG tablet TAKE 1 TABLET(10 MG) BY MOUTH TWICE DAILY AS NEEDED FOR NAUSEA OR VOMITING (Patient taking differently: Take 10 mg by mouth 2 (two) times daily as needed for nausea or vomiting. ) 30 tablet 0  . amiodarone (PACERONE) 200 MG tablet Take 1 tablet by mouth twice a day for 1 month then reduce to 1 tablet daily 60 tablet 0   Current Facility-Administered Medications  Medication Dose Route  Frequency Provider Last Rate Last Admin  . indomethacin (INDOCIN) capsule 25 mg  25 mg Oral BID WC Jessy Oto, MD        Allergies  Allergen Reactions  . Megace Es [Megestrol Acetate] Shortness Of Breath  . Sulfonamide Derivatives Rash    Social History   Socioeconomic History  . Marital status: Single    Spouse name: Not on file  . Number of children: Not on file  . Years of education: Not on file  . Highest education level: Not on file  Occupational History  . Not on file  Tobacco Use  . Smoking status: Never Smoker  . Smokeless tobacco: Never Used  Substance and Sexual Activity  . Alcohol use: No    Alcohol/week: 0.0  standard drinks  . Drug use: No  . Sexual activity: Not Currently    Birth control/protection: Post-menopausal  Other Topics Concern  . Not on file  Social History Narrative  . Not on file   Social Determinants of Health   Financial Resource Strain:   . Difficulty of Paying Living Expenses: Not on file  Food Insecurity:   . Worried About Charity fundraiser in the Last Year: Not on file  . Ran Out of Food in the Last Year: Not on file  Transportation Needs:   . Lack of Transportation (Medical): Not on file  . Lack of Transportation (Non-Medical): Not on file  Physical Activity:   . Days of Exercise per Week: Not on file  . Minutes of Exercise per Session: Not on file  Stress:   . Feeling of Stress : Not on file  Social Connections:   . Frequency of Communication with Friends and Family: Not on file  . Frequency of Social Gatherings with Friends and Family: Not on file  . Attends Religious Services: Not on file  . Active Member of Clubs or Organizations: Not on file  . Attends Archivist Meetings: Not on file  . Marital Status: Not on file  Intimate Partner Violence:   . Fear of Current or Ex-Partner: Not on file  . Emotionally Abused: Not on file  . Physically Abused: Not on file  . Sexually Abused: Not on file     ROS- All  systems are reviewed and negative except as per the HPI above.  Physical Exam: Vitals:   10/14/19 1510  BP: (!) 144/70  Pulse: (!) 115  Weight: 108 kg  Height: 5' 2"  (1.575 m)    GEN- The patient is well appearing obese elderly female, alert and oriented x 3 today.   HEENT-head normocephalic, atraumatic, sclera clear, conjunctiva pink, hearing intact, trachea midline. Lungs- Clear to ausculation bilaterally, normal work of breathing Heart- irregular rate and rhythm, no murmurs, rubs or gallops  GI- soft, NT, ND, + BS Extremities- no clubbing, cyanosis. 1+ bilateral edema MS- no significant deformity or atrophy Skin- no rash or lesion Psych- euthymic mood, full affect Neuro- strength and sensation are intact   Wt Readings from Last 3 Encounters:  10/14/19 108 kg  10/07/19 106.7 kg  10/01/19 105.7 kg    EKG today demonstrates atrial tachycardia HR 115, PVCs, QRS 74, QTc 473  Echo 08/02/19 demonstrated   1. Left ventricular ejection fraction, by visual estimation, is 55 to 60%. The left ventricle has normal function. There is mildly increased left ventricular hypertrophy.  2. The left ventricle has no regional wall motion abnormalities.  3. Global right ventricle has normal systolic function.The right ventricular size is normal. No increase in right ventricular wall thickness.  4. Left atrial size was normal.  5. Right atrial size was normal.  6. The mitral valve is normal in structure. Trace mitral valve regurgitation. No evidence of mitral stenosis.  7. The tricuspid valve is normal in structure. Tricuspid valve regurgitation is mild.  8. The aortic valve is normal in structure. Aortic valve regurgitation is not visualized. Mild aortic valve sclerosis without stenosis.  9. There is Mild calcification of the aortic valve. 10. There is Mild thickening of the aortic valve. 11. The pulmonic valve was grossly normal. Pulmonic valve regurgitation is not visualized. 12. Moderately  elevated pulmonary artery systolic pressure. 13. The inferior vena cava is dilated in size with <50% respiratory variability, suggesting right  atrial pressure of 15 mmHg.  Epic records are reviewed at length today  Assessment and Plan:  1. Persistent atrial fibrillation/Atach Per Dr Sundra Aland office visit note 08/19/19, anticoagulation is not recommended until uterine bleeding resolved. We are limited to rate control for now.  Will start amiodarone 200 mg BID for rate control. Recent lab work reviewed.  Continue diltiazem to 300 mg daily for now. May decrease this once she is loaded on amiodarone.   This patients CHA2DS2-VASc Score and unadjusted Ischemic Stroke Rate (% per year) is equal to 4.8 % stroke rate/year from a score of 4  Above score calculated as 1 point each if present [CHF, HTN, DM, Vascular=MI/PAD/Aortic Plaque, Age if 65-74, or Female] Above score calculated as 2 points each if present [Age > 75, or Stroke/TIA/TE]   2. Obesity Body mass index is 43.53 kg/m. Lifestyle modification was discussed and encouraged including regular physical activity and weight reduction. Limited by SOB.  3. Snoring/witness apnea/daytime somnolence The importance of adequate treatment of sleep apnea was discussed today in order to improve our ability to maintain sinus rhythm long term. Sleep study ordered.  4. HTN Stable, no changes today.  5. Lower extremity edema Increase Lasix to 40 mg daily x3 days with increased KCl. Bmet today. Hopefully this will improve with better rate control.    Follow up in the AF clinic in 1-2 weeks.    [facility] [address] [place], [place] [address] [phone] 10/14/2019 3:50 PM

## 2019-10-14 NOTE — Patient Instructions (Signed)
Take lasix 50m for the next 3 days then reduce to 267mevery morning  Take potassium 4044mfor the next 3 days then reduce to 74m46mvery morning  Start Amiodarone 200mg36mce a day (with food) for 1 month then reduce to 1 tablet daily

## 2019-10-16 ENCOUNTER — Ambulatory Visit: Payer: Medicare Other | Admitting: Family Medicine

## 2019-10-23 ENCOUNTER — Other Ambulatory Visit: Payer: Self-pay

## 2019-10-23 ENCOUNTER — Ambulatory Visit (HOSPITAL_COMMUNITY)
Admission: RE | Admit: 2019-10-23 | Discharge: 2019-10-23 | Disposition: A | Discharge status: Home / Self Care | Payer: Medicare Other | Source: Ambulatory Visit | Attending: Physician Assistant | Admitting: Physician Assistant

## 2019-10-23 ENCOUNTER — Encounter (HOSPITAL_COMMUNITY): Payer: Self-pay | Admitting: Physician Assistant

## 2019-10-23 ENCOUNTER — Ambulatory Visit: Payer: Medicare Other | Admitting: Gynecologic Oncology

## 2019-10-23 VITALS — BP 150/78 | HR 96 | Ht 62.0 in | Wt 239.2 lb

## 2019-10-23 DIAGNOSIS — R609 Edema, unspecified: Secondary | ICD-10-CM | POA: Diagnosis not present

## 2019-10-23 DIAGNOSIS — R0683 Snoring: Secondary | ICD-10-CM | POA: Insufficient documentation

## 2019-10-23 DIAGNOSIS — Z882 Allergy status to sulfonamides status: Secondary | ICD-10-CM | POA: Insufficient documentation

## 2019-10-23 DIAGNOSIS — E669 Obesity, unspecified: Secondary | ICD-10-CM | POA: Diagnosis not present

## 2019-10-23 DIAGNOSIS — Z8249 Family history of ischemic heart disease and other diseases of the circulatory system: Secondary | ICD-10-CM | POA: Insufficient documentation

## 2019-10-23 DIAGNOSIS — Z6841 Body Mass Index (BMI) 40.0 and over, adult: Secondary | ICD-10-CM | POA: Insufficient documentation

## 2019-10-23 DIAGNOSIS — D6869 Other thrombophilia: Secondary | ICD-10-CM | POA: Diagnosis not present

## 2019-10-23 DIAGNOSIS — I10 Essential (primary) hypertension: Secondary | ICD-10-CM | POA: Insufficient documentation

## 2019-10-23 DIAGNOSIS — I4819 Other persistent atrial fibrillation: Secondary | ICD-10-CM | POA: Insufficient documentation

## 2019-10-23 DIAGNOSIS — Z79899 Other long term (current) drug therapy: Secondary | ICD-10-CM | POA: Insufficient documentation

## 2019-10-23 DIAGNOSIS — Z888 Allergy status to other drugs, medicaments and biological substances status: Secondary | ICD-10-CM | POA: Insufficient documentation

## 2019-10-23 DIAGNOSIS — I471 Supraventricular tachycardia: Secondary | ICD-10-CM

## 2019-10-23 NOTE — Patient Instructions (Signed)
March 1st you will go down to 1 tablet a day of amiodarone   Scheduling will contact you for an appointment with Dr. Rayann Heman in 1 month.

## 2019-10-23 NOTE — Progress Notes (Signed)
Primary Care Physician: Jackie Rakes, MD Primary Cardiologist: none Primary Electrophysiologist: none Referring Physician: Dr Jackie Parker is a 79 y.o. 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 Patient was brought to the ED due to atrial fibrillation with rapid ventricular response from a preop appointment for an D&C to treat abnormal uterine bleeding. EKG on arrival showed atrial fibrillation with RVR, with HR in the 180s. Patient was rate controlled with diltiazem. She underwent D&C during her admission. Per GYN recommendations, she was not started on anticoagulation for a CHADS2VASC score of 4. She does admit to snoring, witnessed apnea, and daytime somnolence. She denies significant alcohol use.   On follow up today, patient reports that she has done reasonably well. She denies any heart racing since starting amiodarone. She also feels that her leg edema is back to baseline. She denies orthopnea. Patient cancelled her CT scan with GYN oncology.   Today, she denies symptoms of palpitations, chest pain, orthopnea, PND, dizziness, presyncope, syncope, or neurologic sequela. The patient is tolerating medications without difficulties and is otherwise without complaint today.    Atrial Fibrillation Risk Factors:  she does have symptoms or diagnosis of sleep apnea. she does not have a history of rheumatic fever. she does not have a history of alcohol use. The patient does not have a history of early familial atrial fibrillation or other arrhythmias.  she has a BMI of Body mass index is 43.75 kg/m.Marland Kitchen Filed Weights   10/23/19 1511  Weight: 108.5 kg    Family History  Problem  Relation Age of Onset  . Hypertension Sister   . Glaucoma Sister   . Endometrial cancer Neg Hx   . Ovarian cancer Neg Hx      Atrial Fibrillation Management history:  Previous antiarrhythmic drugs: amiodarone Previous cardioversions: none Previous ablations: none CHADS2VASC score: 4 Anticoagulation history: none   Past Medical History:  Diagnosis Date  . AKI (acute kidney injury) ([place]) 12/2015  . Allergy    takes Singulair and Zyrtec daily  . Asthma    uses Adair daily  . Cataracts, bilateral   . Colitis, ulcerative ([place])   . DJD (degenerative joint disease)   . Glaucoma    uses eye drops daily  . History of blood transfusion    no abnormal reaction noted. 40+ yrs ago  . History of bronchitis    yrs ago  . History of gout    not on any meds  . Hypertension    takes Amlodipine daily  . Joint pain   . Joint swelling   . PMB (postmenopausal bleeding)   . Psoriasis   . Urinary frequency    takes Ditropan daily  . Vertigo    doesn't take any meds  . Vitamin D deficiency    Past Surgical History:  Procedure Laterality Date  . BACK SURGERY    . COLONOSCOPY    . DILATATION & CURETTAGE/HYSTEROSCOPY WITH MYOSURE N/A 08/05/2019   Procedure: DILATATION & CURETTAGE/HYSTEROSCOPY WITH MYOSURE;  Surgeon: Jackie Louis, MD;  Location: [facility];  Service: Gynecology;  Laterality: N/A;  Myosure rep will be here confirmed on 08/01/19 CS  . ILEOSTOMY    . KNEE ARTHROPLASTY Left 12/14/2015   Procedure: COMPUTER ASSISTED LEFT TOTAL KNEE  ARTHROPLASTY;  Surgeon: Jackie Oto, MD;  Location: [facility];  Service: Orthopedics;  Laterality: Left;  . TOTAL COLECTOMY      total colectomy in 1977  . TOTAL KNEE ARTHROPLASTY Left 12/14/2015  . TOTAL KNEE ARTHROPLASTY Right 06/27/2016  . TOTAL KNEE ARTHROPLASTY Right 06/27/2016   Procedure: RIGHT TOTAL KNEE ARTHROPLASTY;  Surgeon: Jackie Oto, MD;  Location: [facility];  Service: Orthopedics;  Laterality: Right;  . TUBAL LIGATION      Current  Outpatient Medications  Medication Sig Dispense Refill  . allopurinol (ZYLOPRIM) 100 MG tablet TAKE 1 TABLET(100 MG) BY MOUTH DAILY (Patient taking differently: Take 100 mg by mouth as needed. ) 30 tablet 1  . amiodarone (PACERONE) 200 MG tablet TAKE 1 TABLET BY MOUTH TWICE DAILY FOR 1 MONTH THEN REDUCE TO 1 TABLET BY MOUTH DAILY 60 tablet 0  . brimonidine (ALPHAGAN) 0.2 % ophthalmic solution Place 1 drop into the right eye 3 (three) times daily.    . brinzolamide (AZOPT) 1 % ophthalmic suspension Place 1 drop into the right eye 3 (three) times daily.     . cetirizine (ZYRTEC) 10 MG tablet TAKE 1 TABLET(10 MG) BY MOUTH DAILY (Patient taking differently: Take 10 mg by mouth daily. ) 30 tablet 2  . colchicine 0.6 MG tablet Take 2 tabs (1.2 mg) at the onset of a gout attack, may repeat 1 tab (0.6 mg) 2 hours later if symptoms persist. 30 tablet 1  . diltiazem (CARDIZEM CD) 300 MG 24 hr capsule Take 1 capsule (300 mg total) by mouth daily. 30 capsule 3  . FLOVENT HFA 110 MCG/ACT inhaler INHALE 2 PUFFS INTO THE LUNGS TWICE DAILY 12 g 2  . fluocinonide ointment (LIDEX) 7.12 % Apply 1 application topically 2 (two) times daily as needed for itching.    . furosemide (LASIX) 20 MG tablet Take 2 tablets by mouth daily for 3 days then reduce to 1 tablet daily 90 tablet 1  . LUMIGAN 0.01 % SOLN Place 1 drop into both eyes at bedtime.    . megestrol (MEGACE) 40 MG tablet Take 1 tablet (40 mg total) by mouth 2 (two) times daily. 60 tablet 11  . potassium chloride (KLOR-CON) 10 MEQ tablet Take 2 tablets (20 mEq total) by mouth daily. 180 tablet 2  . PROAIR HFA 108 (90 Base) MCG/ACT inhaler INHALE 2 PUFFS INTO THE LUNGS EVERY 6 HOURS AS NEEDED FOR WHEEZING OR SHORTNESS OF BREATH 8.5 g 2  . prochlorperazine (COMPAZINE) 10 MG tablet TAKE 1 TABLET(10 MG) BY MOUTH TWICE DAILY AS NEEDED FOR NAUSEA OR VOMITING (Patient taking differently: Take 10 mg by mouth 2 (two) times daily as needed for nausea or vomiting. ) 30 tablet  0   Current Facility-Administered Medications  Medication Dose Route Frequency Provider Last Rate Last Admin  . indomethacin (INDOCIN) capsule 25 mg  25 mg Oral BID WC Jackie Oto, MD        Allergies  Allergen Reactions  . Megace Es [Megestrol Acetate] Shortness Of Breath  . Sulfonamide Derivatives Rash    Social History   Socioeconomic History  . Marital status: Single    Spouse name: Not on file  . Number of children: Not on file  . Years of education: Not on file  . Highest education level: Not on file  Occupational History  . Not on file  Tobacco Use  . Smoking status: Never Smoker  . Smokeless tobacco: Never Used  Substance and Sexual Activity  .  Alcohol use: No    Alcohol/week: 0.0 standard drinks  . Drug use: No  . Sexual activity: Not Currently    Birth control/protection: Post-menopausal  Other Topics Concern  . Not on file  Social History Narrative  . Not on file   Social Determinants of Health   Financial Resource Strain:   . Difficulty of Paying Living Expenses: Not on file  Food Insecurity:   . Worried About Charity fundraiser in the Last Year: Not on file  . Ran Out of Food in the Last Year: Not on file  Transportation Needs:   . Lack of Transportation (Medical): Not on file  . Lack of Transportation (Non-Medical): Not on file  Physical Activity:   . Days of Exercise per Week: Not on file  . Minutes of Exercise per Session: Not on file  Stress:   . Feeling of Stress : Not on file  Social Connections:   . Frequency of Communication with Friends and Family: Not on file  . Frequency of Social Gatherings with Friends and Family: Not on file  . Attends Religious Services: Not on file  . Active Member of Clubs or Organizations: Not on file  . Attends Archivist Meetings: Not on file  . Marital Status: Not on file  Intimate Partner Violence:   . Fear of Current or Ex-Partner: Not on file  . Emotionally Abused: Not on file  .  Physically Abused: Not on file  . Sexually Abused: Not on file     ROS- All systems are reviewed and negative except as per the HPI above.  Physical Exam: Vitals:   10/23/19 1511  BP: (!) 150/78  Pulse: 96  Weight: 108.5 kg  Height: 5' 2"  (1.575 m)    GEN- The patient is well appearing obese elderly female, alert and oriented x 3 today.   HEENT-head normocephalic, atraumatic, sclera clear, conjunctiva pink, hearing intact, trachea midline. Lungs- Clear to ausculation bilaterally, normal work of breathing Heart- Regular rate and rhythm, no murmurs, rubs or gallops  GI- soft, NT, ND, + BS Extremities- no clubbing, cyanosis. Trace bilateral edema. MS- no significant deformity or atrophy Skin- no rash or lesion Psych- euthymic mood, full affect Neuro- strength and sensation are intact   Wt Readings from Last 3 Encounters:  10/23/19 108.5 kg  10/14/19 108 kg  10/07/19 106.7 kg    EKG today demonstrates SR HR 96, PACs, PR 120, QRS 76, QTc 469  Echo 08/02/19 demonstrated   1. Left ventricular ejection fraction, by visual estimation, is 55 to 60%. The left ventricle has normal function. There is mildly increased left ventricular hypertrophy.  2. The left ventricle has no regional wall motion abnormalities.  3. Global right ventricle has normal systolic function.The right ventricular size is normal. No increase in right ventricular wall thickness.  4. Left atrial size was normal.  5. Right atrial size was normal.  6. The mitral valve is normal in structure. Trace mitral valve regurgitation. No evidence of mitral stenosis.  7. The tricuspid valve is normal in structure. Tricuspid valve regurgitation is mild.  8. The aortic valve is normal in structure. Aortic valve regurgitation is not visualized. Mild aortic valve sclerosis without stenosis.  9. There is Mild calcification of the aortic valve. 10. There is Mild thickening of the aortic valve. 11. The pulmonic valve was grossly  normal. Pulmonic valve regurgitation is not visualized. 12. Moderately elevated pulmonary artery systolic pressure. 13. The inferior vena cava is dilated  in size with <50% respiratory variability, suggesting right atrial pressure of 15 mmHg.  Epic records are reviewed at length today  Assessment and Plan:  1. Persistent atrial fibrillation/Atach Per Dr Sundra Aland office visit note 08/19/19, anticoagulation is not recommended until uterine bleeding resolved. We are limited to rate control for now. Patient has seen Dr Denman George at [facility] oncology.  Continue amiodarone 200 mg BID for 3 weeks then decrease to 200 mg daily. Currently using amiodarone for rate control.  Continue diltiazem to 300 mg daily.  This patients CHA2DS2-VASc Score and unadjusted Ischemic Stroke Rate (% per year) is equal to 4.8 % stroke rate/year from a score of 4  Above score calculated as 1 point each if present [CHF, HTN, DM, Vascular=MI/PAD/Aortic Plaque, Age if 65-74, or Female] Above score calculated as 2 points each if present [Age > 75, or Stroke/TIA/TE]   2. Obesity Body mass index is 43.75 kg/m. Lifestyle modification was discussed and encouraged including regular physical activity and weight reduction.  3. Snoring/witness apnea/daytime somnolence The importance of adequate treatment of sleep apnea was discussed today in order to improve our ability to maintain sinus rhythm long term. Sleep study ordered but not completed.  4. HTN Stable, no changes today.  5. Lower extremity edema Patient reports her edema is back to baseline.  2 g sodium diet. Patient educated about daily weights   Follow up to establish care with EP in one month. AF clinic in 3 months.    [facility] [address] [facility] [place], [place] [address] [phone] 10/23/2019 3:35 PM

## 2019-10-24 ENCOUNTER — Ambulatory Visit (HOSPITAL_COMMUNITY): Payer: Medicare Other | Admitting: Physician Assistant

## 2019-10-30 ENCOUNTER — Telehealth: Payer: Self-pay

## 2019-10-30 ENCOUNTER — Ambulatory Visit: Payer: Medicare Other | Attending: Family Medicine | Admitting: Family Medicine

## 2019-10-30 ENCOUNTER — Encounter: Payer: Self-pay | Admitting: Family Medicine

## 2019-10-30 DIAGNOSIS — J4541 Moderate persistent asthma with (acute) exacerbation: Secondary | ICD-10-CM

## 2019-10-30 DIAGNOSIS — N950 Postmenopausal bleeding: Secondary | ICD-10-CM | POA: Diagnosis not present

## 2019-10-30 DIAGNOSIS — I4891 Unspecified atrial fibrillation: Secondary | ICD-10-CM

## 2019-10-30 NOTE — Telephone Encounter (Signed)
Call received from patient.  Explained to her about the appointment/ call tomorrow.  She said that she would be using a standard phone.  Provided her with the phone number for [facility] and instructed her to call Doylene Canning to confirm the phone that she would be using for the appointment.

## 2019-10-30 NOTE — Progress Notes (Signed)
Virtual Visit via Telephone Note  I connected with Jackie Parker, on 10/30/2019 at 1:36 PM by telephone due to the COVID-19 pandemic and verified that I am speaking with the correct person using two identifiers.   Consent: I discussed the limitations, risks, security and privacy concerns of performing an evaluation and management service by telephone and the availability of in person appointments. I also discussed with the patient that there may be a patient responsible charge related to this service. The patient expressed understanding and agreed to proceed.   Location of Patient: Home  Location of Provider: Clinic   Persons participating in Telemedicine visit: Shellsea Borunda Farrington-CMA Dr. Margarita Rana     History of Present Illness: Jackie Parker  is a 79 year old female with Medical history significant for hypertension, ulcerative colitis status post total colectomy in 1977, Glaucoma, bilateral knee osteoarthritis status post bilateral total knee replacement (left in 12/2015, right in 06/2016) seen for follow-up visit. She was found to be in A. fib with RVR when she had presented for preop evaluation for an elective D&C due to postmenopausal bleed. She has been followed by the A. fib clinic since 08/2019 with her last visit on 10/23/2019 and plan was to continue amiodarone and diltiazem for rate control with plans to follow-up with Dr. Rayann Heman in 1 month; she is not on anticoagulation.   Today she complains she has had wheezing over the last 4 days with dyspnea. She now needs a wheelchair which she has never used before due to her dyspnea. At rest she feels fine but when she attempts to move symptoms occur and this includes palpitations, irregular heartbeat with associated dyspnea but no pedal edema.  She is concerned that no one has explained to her the etiology of her symptoms. Echo from 07/2019 revealed: EF 55 to 60%, LVH, moderately elevated pulmonary artery systolic  pressure  She is no longer having vaginal bleeding as she is on Megace.  Seen by Dr. Everitt Amber last month with recommendation for total abdominal hysterectomy with bilateral salpingo-oophorectomy probably by the end of February 2021.  Past Medical History:  Diagnosis Date  . AKI (acute kidney injury) ([place]) 12/2015  . Allergy    takes Singulair and Zyrtec daily  . Asthma    uses Adair daily  . Cataracts, bilateral   . Colitis, ulcerative ([place])   . DJD (degenerative joint disease)   . Glaucoma    uses eye drops daily  . History of blood transfusion    no abnormal reaction noted. 40+ yrs ago  . History of bronchitis    yrs ago  . History of gout    not on any meds  . Hypertension    takes Amlodipine daily  . Joint pain   . Joint swelling   . PMB (postmenopausal bleeding)   . Psoriasis   . Urinary frequency    takes Ditropan daily  . Vertigo    doesn't take any meds  . Vitamin D deficiency    Allergies  Allergen Reactions  . Megace Es [Megestrol Acetate] Shortness Of Breath  . Sulfonamide Derivatives Rash    Current Outpatient Medications on File Prior to Visit  Medication Sig Dispense Refill  . allopurinol (ZYLOPRIM) 100 MG tablet TAKE 1 TABLET(100 MG) BY MOUTH DAILY (Patient taking differently: Take 100 mg by mouth as needed. ) 30 tablet 1  . amiodarone (PACERONE) 200 MG tablet TAKE 1 TABLET BY MOUTH TWICE DAILY FOR 1 MONTH THEN REDUCE TO 1 TABLET BY  MOUTH DAILY 60 tablet 0  . brimonidine (ALPHAGAN) 0.2 % ophthalmic solution Place 1 drop into the right eye 3 (three) times daily.    . brinzolamide (AZOPT) 1 % ophthalmic suspension Place 1 drop into the right eye 3 (three) times daily.     . cetirizine (ZYRTEC) 10 MG tablet TAKE 1 TABLET(10 MG) BY MOUTH DAILY (Patient taking differently: Take 10 mg by mouth daily. ) 30 tablet 2  . colchicine 0.6 MG tablet Take 2 tabs (1.2 mg) at the onset of a gout attack, may repeat 1 tab (0.6 mg) 2 hours later if symptoms persist. 30  tablet 1  . diltiazem (CARDIZEM CD) 300 MG 24 hr capsule Take 1 capsule (300 mg total) by mouth daily. 30 capsule 3  . FLOVENT HFA 110 MCG/ACT inhaler INHALE 2 PUFFS INTO THE LUNGS TWICE DAILY 12 g 2  . fluocinonide ointment (LIDEX) 8.09 % Apply 1 application topically 2 (two) times daily as needed for itching.    . furosemide (LASIX) 20 MG tablet Take 2 tablets by mouth daily for 3 days then reduce to 1 tablet daily 90 tablet 1  . LUMIGAN 0.01 % SOLN Place 1 drop into both eyes at bedtime.    . megestrol (MEGACE) 40 MG tablet Take 1 tablet (40 mg total) by mouth 2 (two) times daily. 60 tablet 11  . potassium chloride (KLOR-CON) 10 MEQ tablet Take 2 tablets (20 mEq total) by mouth daily. 180 tablet 2  . PROAIR HFA 108 (90 Base) MCG/ACT inhaler INHALE 2 PUFFS INTO THE LUNGS EVERY 6 HOURS AS NEEDED FOR WHEEZING OR SHORTNESS OF BREATH 8.5 g 2  . prochlorperazine (COMPAZINE) 10 MG tablet TAKE 1 TABLET(10 MG) BY MOUTH TWICE DAILY AS NEEDED FOR NAUSEA OR VOMITING (Patient taking differently: Take 10 mg by mouth 2 (two) times daily as needed for nausea or vomiting. ) 30 tablet 0   Current Facility-Administered Medications on File Prior to Visit  Medication Dose Route Frequency Provider Last Rate Last Admin  . indomethacin (INDOCIN) capsule 25 mg  25 mg Oral BID WC Jessy Oto, MD        Observations/Objective: Alert, awake, oriented x3 She does sound slightly dyspneic while speaking on the phone but this improves when she stops talking  Assessment and Plan: 1. Moderate persistent asthma with exacerbation Her symptoms seem to be of cardiac etiology Continue Flovent and albuterol  2. Atrial fibrillation with RVR (HCC) CHA2DS2-VASc score of 4 Currently on rate control with amiodarone and diltiazem Not on anticoagulation My office has contacted Dr. Jackalyn Lombard office and obtained an appointment for tomorrow ED precautions have been discussed with her  3. Post-menopause  bleeding Resolved Currently on Megace   Follow Up Instructions: 1 month -ordination of care   I discussed the assessment and treatment plan with the patient. The patient was provided an opportunity to ask questions and all were answered. The patient agreed with the plan and demonstrated an understanding of the instructions.   The patient was advised to call back or seek an in-person evaluation if the symptoms worsen or if the condition fails to improve as anticipated.     I provided 23 minutes total of non-face-to-face time during this encounter including median intraservice time, reviewing previous notes, investigations, ordering medications, medical decision making, coordinating care and patient verbalized understanding at the end of the visit.     [name], MD, FAAFP. [facility] [place], [place]   10/30/2019,  1:36 PM

## 2019-10-30 NOTE — Telephone Encounter (Signed)
At request of Dr Margarita Rana, call placed to Dr Jackalyn Lombard office and scheduled patient for an appt tomorrow - 10/31/2019 @ 0930. The appointment will be virtual - MyChart, smartphone or telephone.   Call placed to patient [phone] in attempt to notify her of the above appointment.  Message left with call back requested to this CM. Patient needs to confirm what device she will be using for the call - smart phone or standard phone. She can call [facility] # [phone]

## 2019-10-30 NOTE — Progress Notes (Signed)
Patient has been called and DOB has been verified. Patient has been screened and transferred to PCP to start phone visit.  Patient is having wheezing in her chest.she gets out of breath very fast. Patient is requesting a back brace for her back pain.

## 2019-10-31 ENCOUNTER — Other Ambulatory Visit: Payer: Self-pay

## 2019-10-31 ENCOUNTER — Encounter: Payer: Self-pay | Admitting: Internal Medicine

## 2019-10-31 ENCOUNTER — Telehealth (INDEPENDENT_AMBULATORY_CARE_PROVIDER_SITE_OTHER): Payer: Medicare Other | Admitting: Internal Medicine

## 2019-10-31 ENCOUNTER — Telehealth: Payer: Self-pay

## 2019-10-31 VITALS — BP 190/97 | HR 90 | Ht 62.0 in | Wt 236.0 lb

## 2019-10-31 DIAGNOSIS — I4819 Other persistent atrial fibrillation: Secondary | ICD-10-CM | POA: Diagnosis not present

## 2019-10-31 DIAGNOSIS — R0602 Shortness of breath: Secondary | ICD-10-CM

## 2019-10-31 DIAGNOSIS — I10 Essential (primary) hypertension: Secondary | ICD-10-CM | POA: Diagnosis not present

## 2019-10-31 DIAGNOSIS — I471 Supraventricular tachycardia: Secondary | ICD-10-CM | POA: Diagnosis not present

## 2019-10-31 DIAGNOSIS — Z6841 Body Mass Index (BMI) 40.0 and over, adult: Secondary | ICD-10-CM

## 2019-10-31 NOTE — Telephone Encounter (Signed)
Thompson Grayer, MD sent to Damian Leavell, RN  Also order PA and lateral CXR to evaluate SOB

## 2019-10-31 NOTE — Telephone Encounter (Signed)
Call placed to Pt.  Advised this nurse had ordered PFT's and she will get a call from scheduler to set up time and give her directions.  Also advised chest xray ordered at [facility] imaging.  Pt states she knows where that is.  Advised she could go anytime for her xray.  Advised Pt would receive a call from scheduler to set up 6 week f/u.  Pt would like a Monday or WEdnesday appt if possible=-IN OFFICE.

## 2019-10-31 NOTE — Progress Notes (Signed)
Electrophysiology TeleHealth Note  Due to national recommendations of social distancing due to [place] 19, an audio telehealth visit is felt to be most appropriate for this patient at this time.  Verbal consent was obtained by me for the telehealth visit today.  The patient does not have capability for a virtual visit.  A phone visit is therefore required today.   Date:  10/31/2019   ID:  Jackie Parker, DOB June 27, 1941, MRN [id number]  Location: patient's home  Provider location:  [place]  Evaluation Performed: Follow-up visit  PCP:  [name], MD   Electrophysiologist:  Dr Rayann Heman  Chief Complaint:  palpitations  History of Present Illness:    Jackie Parker is a 79 y.o. female who presents via telehealth conferencing today.  Since last being seen in our clinic, the patient reports doing reasonably well.  She reports having "good days and bad days".  She has SOB with moderate activity. This has been persisting over the past year but has worsened.  She feels that her afib has improved. She does not think her SOB is due to amiodarone. Today, she denies symptoms of palpitations, chest pain,   dizziness, presyncope, or syncope.  The patient is otherwise without complaint today.  The patient denies symptoms of fevers, chills, cough, or new SOB worrisome for COVID 19.  Past Medical History:  Diagnosis Date  . AKI (acute kidney injury) ([place]) 12/2015  . Allergy    takes Singulair and Zyrtec daily  . Asthma    uses Adair daily  . Cataracts, bilateral   . Colitis, ulcerative ([place])   . DJD (degenerative joint disease)   . Glaucoma    uses eye drops daily  . History of blood transfusion    no abnormal reaction noted. 40+ yrs ago  . History of bronchitis    yrs ago  . History of gout    not on any meds  . Hypertension    takes Amlodipine daily  . Joint pain   . Joint swelling   . PMB (postmenopausal bleeding)   . Psoriasis   . Urinary frequency    takes Ditropan  daily  . Vertigo    doesn't take any meds  . Vitamin D deficiency     Past Surgical History:  Procedure Laterality Date  . BACK SURGERY    . COLONOSCOPY    . DILATATION & CURETTAGE/HYSTEROSCOPY WITH MYOSURE N/A 08/05/2019   Procedure: DILATATION & CURETTAGE/HYSTEROSCOPY WITH MYOSURE;  Surgeon: Christophe Louis, MD;  Location: [facility] [place];  Service: Gynecology;  Laterality: N/A;  Myosure rep will be here confirmed on 08/01/19 CS  . ILEOSTOMY    . KNEE ARTHROPLASTY Left 12/14/2015   Procedure: COMPUTER ASSISTED LEFT TOTAL KNEE ARTHROPLASTY;  Surgeon: Jessy Oto, MD;  Location: [place];  Service: Orthopedics;  Laterality: Left;  . TOTAL COLECTOMY      total colectomy in 1977  . TOTAL KNEE ARTHROPLASTY Left 12/14/2015  . TOTAL KNEE ARTHROPLASTY Right 06/27/2016  . TOTAL KNEE ARTHROPLASTY Right 06/27/2016   Procedure: RIGHT TOTAL KNEE ARTHROPLASTY;  Surgeon: Jessy Oto, MD;  Location: [place];  Service: Orthopedics;  Laterality: Right;  . TUBAL LIGATION      Current Outpatient Medications  Medication Sig Dispense Refill  . allopurinol (ZYLOPRIM) 100 MG tablet TAKE 1 TABLET(100 MG) BY MOUTH DAILY 30 tablet 1  . amiodarone (PACERONE) 200 MG tablet TAKE 1 TABLET BY MOUTH TWICE DAILY FOR 1 MONTH THEN REDUCE TO 1 TABLET BY  MOUTH DAILY 60 tablet 0  . brimonidine (ALPHAGAN) 0.2 % ophthalmic solution Place 1 drop into the right eye 3 (three) times daily.    . brinzolamide (AZOPT) 1 % ophthalmic suspension Place 1 drop into the right eye 3 (three) times daily.     . cetirizine (ZYRTEC) 10 MG tablet TAKE 1 TABLET(10 MG) BY MOUTH DAILY 30 tablet 2  . colchicine 0.6 MG tablet Take 2 tabs (1.2 mg) at the onset of a gout attack, may repeat 1 tab (0.6 mg) 2 hours later if symptoms persist. 30 tablet 1  . diltiazem (CARDIZEM CD) 300 MG 24 hr capsule Take 1 capsule (300 mg total) by mouth daily. 30 capsule 3  . FLOVENT HFA 110 MCG/ACT inhaler INHALE 2 PUFFS INTO THE LUNGS TWICE DAILY 12 g 2  . fluocinonide ointment  (LIDEX) 8.93 % Apply 1 application topically 2 (two) times daily as needed for itching.    . furosemide (LASIX) 20 MG tablet Take 2 tablets by mouth daily for 3 days then reduce to 1 tablet daily 90 tablet 1  . LUMIGAN 0.01 % SOLN Place 1 drop into both eyes at bedtime.    . megestrol (MEGACE) 40 MG tablet Take 1 tablet (40 mg total) by mouth 2 (two) times daily. 60 tablet 11  . potassium chloride (KLOR-CON) 10 MEQ tablet Take 2 tablets (20 mEq total) by mouth daily. 180 tablet 2  . PROAIR HFA 108 (90 Base) MCG/ACT inhaler INHALE 2 PUFFS INTO THE LUNGS EVERY 6 HOURS AS NEEDED FOR WHEEZING OR SHORTNESS OF BREATH 8.5 g 2  . prochlorperazine (COMPAZINE) 10 MG tablet TAKE 1 TABLET(10 MG) BY MOUTH TWICE DAILY AS NEEDED FOR NAUSEA OR VOMITING 30 tablet 0   Current Facility-Administered Medications  Medication Dose Route Frequency Provider Last Rate Last Admin  . indomethacin (INDOCIN) capsule 25 mg  25 mg Oral BID WC Jessy Oto, MD        Allergies:   Megace es [megestrol acetate] and Sulfonamide derivatives   Social History:  The patient  reports that she has never smoked. She has never used smokeless tobacco. She reports that she does not drink alcohol or use drugs.   Family History:  The patient's  family history includes Glaucoma in her sister; Hypertension in her sister.   ROS:  Please see the history of present illness.   All other systems are personally reviewed and negative.   AF clinic notes are reviewed, prior echo is reviewed,  Prior ekgs are reviewed  Exam:    Vital Signs:  BP (!) 190/97   Pulse 90   Ht 5' 2"  (1.575 m)   Wt 236 lb (107 kg)   BMI 43.16 kg/m   Well sounding, alert and conversant   Labs/Other Tests and Data Reviewed:    Recent Labs: 08/01/2019: B Natriuretic Peptide 209.8; TSH 2.775 08/04/2019: Magnesium 1.9 08/06/2019: ALT 16; Hemoglobin 8.6; Platelets 171 10/14/2019: BUN 8; Creatinine, Ser 1.35; Potassium 3.6; Sodium 142   Wt Readings from Last 3  Encounters:  10/31/19 236 lb (107 kg)  10/23/19 239 lb 3.2 oz (108.5 kg)  10/14/19 238 lb (108 kg)      ASSESSMENT & PLAN:    1.  SOB  Unclear etiology and likely multifactorial Her SOB was present prior to amiodarone. I will order PFTs and CXR at this time EF is preserved  2. Atrial tachycardia/ persistent afib Clinically improved with amiodarone She is now taking amiodarone 248m daily Continue current approach  The importance of close monitoring of amiodarone to avoid drug related toxicity is considered. PFTs and CXR as above Labs 08/06/19 also reviewed Will repeat TFTs and LFTS on return Given advanced age and comoribidites, would not advise ablation currently chads2vasc score is 4.  Anticoagulation is on hold due to uterine bleeding  3. Obesity Body mass index is 43.16 kg/m. Her albumen is 2.  She is quite ill  4. HTN Stable No change required today  Follow-up:  6 weeks with me in the office   Patient Risk:  after full review of this patients clinical status, I feel that they are at high risk at this time.  Today, I have spent 20 minutes with the patient with telehealth technology discussing arrhythmia management .    Army Fossa, MD  10/31/2019 9:35 AM     [facility] [address] [place] [address] [phone] (office) [phone] (fax)

## 2019-10-31 NOTE — Telephone Encounter (Signed)
-----   Message from Thompson Grayer, MD sent at 10/31/2019  9:40 AM EST ----- Order pulmonary function tests with spirometry Follow-up with me in the office in 6 weeks

## 2019-11-01 ENCOUNTER — Ambulatory Visit (HOSPITAL_COMMUNITY): Payer: Medicare Other | Admitting: Physician Assistant

## 2019-11-04 ENCOUNTER — Ambulatory Visit (HOSPITAL_COMMUNITY): Payer: Medicare Other | Admitting: Physician Assistant

## 2019-11-06 ENCOUNTER — Ambulatory Visit
Admission: RE | Admit: 2019-11-06 | Discharge: 2019-11-06 | Disposition: A | Discharge status: Home / Self Care | Payer: Medicare Other | Source: Ambulatory Visit | Attending: Internal Medicine | Admitting: Internal Medicine

## 2019-11-06 DIAGNOSIS — R0602 Shortness of breath: Secondary | ICD-10-CM

## 2019-11-13 ENCOUNTER — Telehealth: Payer: Self-pay | Admitting: Internal Medicine

## 2019-11-13 ENCOUNTER — Other Ambulatory Visit: Payer: Self-pay

## 2019-11-13 ENCOUNTER — Ambulatory Visit (HOSPITAL_COMMUNITY)
Admission: RE | Admit: 2019-11-13 | Discharge: 2019-11-13 | Disposition: A | Discharge status: Home / Self Care | Payer: Medicare Other | Source: Ambulatory Visit | Attending: Physician Assistant | Admitting: Physician Assistant

## 2019-11-13 VITALS — BP 180/70 | HR 106 | Ht 62.0 in | Wt 238.8 lb

## 2019-11-13 DIAGNOSIS — E669 Obesity, unspecified: Secondary | ICD-10-CM | POA: Diagnosis not present

## 2019-11-13 DIAGNOSIS — R0600 Dyspnea, unspecified: Secondary | ICD-10-CM | POA: Insufficient documentation

## 2019-11-13 DIAGNOSIS — Z96653 Presence of artificial knee joint, bilateral: Secondary | ICD-10-CM | POA: Diagnosis not present

## 2019-11-13 DIAGNOSIS — I4819 Other persistent atrial fibrillation: Secondary | ICD-10-CM | POA: Diagnosis not present

## 2019-11-13 DIAGNOSIS — I10 Essential (primary) hypertension: Secondary | ICD-10-CM | POA: Insufficient documentation

## 2019-11-13 DIAGNOSIS — I471 Supraventricular tachycardia: Secondary | ICD-10-CM | POA: Diagnosis not present

## 2019-11-13 DIAGNOSIS — Z8249 Family history of ischemic heart disease and other diseases of the circulatory system: Secondary | ICD-10-CM | POA: Diagnosis not present

## 2019-11-13 DIAGNOSIS — Z882 Allergy status to sulfonamides status: Secondary | ICD-10-CM | POA: Diagnosis not present

## 2019-11-13 DIAGNOSIS — Z6841 Body Mass Index (BMI) 40.0 and over, adult: Secondary | ICD-10-CM | POA: Diagnosis not present

## 2019-11-13 DIAGNOSIS — D6869 Other thrombophilia: Secondary | ICD-10-CM

## 2019-11-13 DIAGNOSIS — Z9049 Acquired absence of other specified parts of digestive tract: Secondary | ICD-10-CM | POA: Insufficient documentation

## 2019-11-13 DIAGNOSIS — Z888 Allergy status to other drugs, medicaments and biological substances status: Secondary | ICD-10-CM | POA: Diagnosis not present

## 2019-11-13 DIAGNOSIS — L409 Psoriasis, unspecified: Secondary | ICD-10-CM | POA: Diagnosis not present

## 2019-11-13 DIAGNOSIS — J45909 Unspecified asthma, uncomplicated: Secondary | ICD-10-CM | POA: Insufficient documentation

## 2019-11-13 DIAGNOSIS — Z79899 Other long term (current) drug therapy: Secondary | ICD-10-CM | POA: Diagnosis not present

## 2019-11-13 DIAGNOSIS — I4891 Unspecified atrial fibrillation: Secondary | ICD-10-CM | POA: Diagnosis present

## 2019-11-13 LAB — CBC
HCT: 38.7 % (ref 36.0–46.0)
Hemoglobin: 12.4 g/dL (ref 12.0–15.0)
MCH: 29.9 pg (ref 26.0–34.0)
MCHC: 32 g/dL (ref 30.0–36.0)
MCV: 93.3 fL (ref 80.0–100.0)
Platelets: 150 10*3/uL (ref 150–400)
RBC: 4.15 MIL/uL (ref 3.87–5.11)
RDW: 17 % — ABNORMAL HIGH (ref 11.5–15.5)
WBC: 8.2 10*3/uL (ref 4.0–10.5)
nRBC: 0 % (ref 0.0–0.2)

## 2019-11-13 MED ORDER — DILTIAZEM HCL ER COATED BEADS 360 MG PO CP24: 360.0000 mg | ORAL_CAPSULE | Freq: Every day | ORAL | 11 refills | Status: DC

## 2019-11-13 NOTE — Patient Instructions (Signed)
Increase Diltiazem 339m daily

## 2019-11-13 NOTE — Progress Notes (Signed)
Primary Care Physician: [name], MD Primary Cardiologist: none Primary Electrophysiologist: Dr Rayann Heman Referring Physician: Dr Heber [place]   Jackie Parker is a 79 y.o. 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 Patient was brought to the ED due to atrial fibrillation with rapid ventricular response from a preop appointment for an D&C to treat abnormal uterine bleeding. EKG on arrival showed atrial fibrillation with RVR, with HR in the 180s. Patient was rate controlled with diltiazem. She underwent D&C during her admission. Per GYN recommendations, she was not started on anticoagulation for a CHADS2VASC score of 4. She does admit to snoring, witnessed apnea, and daytime somnolence. She denies significant alcohol use.   On follow up today, patient reports that she had "vertigo" yesterday associated with high BP readings. Today, she states that she has had some heart racing and her BP has remained elevated. She also has more SOB today. Her weight is stable and she states her lower extremity edema continues to improve.   Today, she denies symptoms of chest pain, orthopnea, PND, dizziness, presyncope, syncope, or neurologic sequela. The patient is tolerating medications without difficulties and is otherwise without complaint today.    Atrial Fibrillation Risk Factors:  she does have symptoms or diagnosis of sleep apnea. she does not have a history of rheumatic fever. she does not have a history of alcohol use. The patient does not have a history of early familial atrial fibrillation or other arrhythmias.  she has a BMI of Body mass index is 43.68 kg/m.Marland Kitchen Filed Weights   11/13/19 1358  Weight:  108.3 kg    Family History  Problem Relation Age of Onset  . Hypertension Sister   . Glaucoma Sister   . Endometrial cancer Neg Hx   . Ovarian cancer Neg Hx      Atrial Fibrillation Management history:  Previous antiarrhythmic drugs: amiodarone Previous cardioversions: none Previous ablations: none CHADS2VASC score: 4 Anticoagulation history: none   Past Medical History:  Diagnosis Date  . AKI (acute kidney injury) ([place]) 12/2015  . Allergy    takes Singulair and Zyrtec daily  . Asthma    uses Adair daily  . Cataracts, bilateral   . Colitis, ulcerative ([place])   . DJD (degenerative joint disease)   . Glaucoma    uses eye drops daily  . History of blood transfusion    no abnormal reaction noted. 40+ yrs ago  . History of bronchitis    yrs ago  . History of gout    not on any meds  . Hypertension    takes Amlodipine daily  . Joint pain   . Joint swelling   . PMB (postmenopausal bleeding)   . Psoriasis   . Urinary frequency    takes Ditropan daily  . Vertigo    doesn't take any meds  . Vitamin D deficiency    Past Surgical History:  Procedure Laterality Date  . BACK SURGERY    . COLONOSCOPY    . DILATATION & CURETTAGE/HYSTEROSCOPY WITH MYOSURE N/A 08/05/2019   Procedure: DILATATION & CURETTAGE/HYSTEROSCOPY WITH MYOSURE;  Surgeon: Christophe Louis, MD;  Location: [place];  Service: Gynecology;  Laterality: N/A;  Myosure rep will be here confirmed on 08/01/19 CS  . ILEOSTOMY    . KNEE ARTHROPLASTY  Left 12/14/2015   Procedure: COMPUTER ASSISTED LEFT TOTAL KNEE ARTHROPLASTY;  Surgeon: Jessy Oto, MD;  Location: [place];  Service: Orthopedics;  Laterality: Left;  . TOTAL COLECTOMY      total colectomy in 1977  . TOTAL KNEE ARTHROPLASTY Left 12/14/2015  . TOTAL KNEE ARTHROPLASTY Right 06/27/2016  . TOTAL KNEE ARTHROPLASTY Right 06/27/2016   Procedure: RIGHT TOTAL KNEE ARTHROPLASTY;  Surgeon: Jessy Oto, MD;  Location: [place];  Service: Orthopedics;  Laterality: Right;  .  TUBAL LIGATION      Current Outpatient Medications  Medication Sig Dispense Refill  . allopurinol (ZYLOPRIM) 100 MG tablet TAKE 1 TABLET(100 MG) BY MOUTH DAILY 30 tablet 1  . amiodarone (PACERONE) 200 MG tablet TAKE 1 TABLET BY MOUTH TWICE DAILY FOR 1 MONTH THEN REDUCE TO 1 TABLET BY MOUTH DAILY 60 tablet 0  . brimonidine (ALPHAGAN) 0.2 % ophthalmic solution Place 1 drop into the right eye 3 (three) times daily.    . brinzolamide (AZOPT) 1 % ophthalmic suspension Place 1 drop into the right eye 3 (three) times daily.     . cetirizine (ZYRTEC) 10 MG tablet TAKE 1 TABLET(10 MG) BY MOUTH DAILY 30 tablet 2  . colchicine 0.6 MG tablet Take 2 tabs (1.2 mg) at the onset of a gout attack, may repeat 1 tab (0.6 mg) 2 hours later if symptoms persist. 30 tablet 1  . FLOVENT HFA 110 MCG/ACT inhaler INHALE 2 PUFFS INTO THE LUNGS TWICE DAILY 12 g 2  . fluocinonide ointment (LIDEX) 0.93 % Apply 1 application topically 2 (two) times daily as needed for itching.    . furosemide (LASIX) 20 MG tablet Take 2 tablets by mouth daily for 3 days then reduce to 1 tablet daily 90 tablet 1  . LUMIGAN 0.01 % SOLN Place 1 drop into both eyes at bedtime.    . megestrol (MEGACE) 40 MG tablet Take 1 tablet (40 mg total) by mouth 2 (two) times daily. 60 tablet 11  . potassium chloride (KLOR-CON) 10 MEQ tablet Take 2 tablets (20 mEq total) by mouth daily. 180 tablet 2  . PROAIR HFA 108 (90 Base) MCG/ACT inhaler INHALE 2 PUFFS INTO THE LUNGS EVERY 6 HOURS AS NEEDED FOR WHEEZING OR SHORTNESS OF BREATH 8.5 g 2  . prochlorperazine (COMPAZINE) 10 MG tablet TAKE 1 TABLET(10 MG) BY MOUTH TWICE DAILY AS NEEDED FOR NAUSEA OR VOMITING 30 tablet 0  . diltiazem (CARDIZEM CD) 360 MG 24 hr capsule Take 1 capsule (360 mg total) by mouth daily. 30 capsule 11   Current Facility-Administered Medications  Medication Dose Route Frequency Provider Last Rate Last Admin  . indomethacin (INDOCIN) capsule 25 mg  25 mg Oral BID WC Jessy Oto, MD         Allergies  Allergen Reactions  . Megace Es [Megestrol Acetate] Shortness Of Breath  . Sulfonamide Derivatives Rash    Social History   Socioeconomic History  . Marital status: Single    Spouse name: Not on file  . Number of children: Not on file  . Years of education: Not on file  . Highest education level: Not on file  Occupational History  . Not on file  Tobacco Use  . Smoking status: Never Smoker  . Smokeless tobacco: Never Used  Substance and Sexual Activity  . Alcohol use: No    Alcohol/week: 0.0 standard drinks  . Drug use: No  . Sexual activity: Not Currently    Birth control/protection: Post-menopausal  Other  Topics Concern  . Not on file  Social History Narrative  . Not on file   Social Determinants of Health   Financial Resource Strain:   . Difficulty of Paying Living Expenses: Not on file  Food Insecurity:   . Worried About Charity fundraiser in the Last Year: Not on file  . Ran Out of Food in the Last Year: Not on file  Transportation Needs:   . Lack of Transportation (Medical): Not on file  . Lack of Transportation (Non-Medical): Not on file  Physical Activity:   . Days of Exercise per Week: Not on file  . Minutes of Exercise per Session: Not on file  Stress:   . Feeling of Stress : Not on file  Social Connections:   . Frequency of Communication with Friends and Family: Not on file  . Frequency of Social Gatherings with Friends and Family: Not on file  . Attends Religious Services: Not on file  . Active Member of Clubs or Organizations: Not on file  . Attends Archivist Meetings: Not on file  . Marital Status: Not on file  Intimate Partner Violence:   . Fear of Current or Ex-Partner: Not on file  . Emotionally Abused: Not on file  . Physically Abused: Not on file  . Sexually Abused: Not on file     ROS- All systems are reviewed and negative except as per the HPI above.  Physical Exam: Vitals:   11/13/19 1358  BP: (!)  180/70  Pulse: (!) 106  SpO2: 96%  Weight: 108.3 kg  Height: 5' 2"  (1.575 m)    GEN- The patient is well appearing obese elderly female, alert and oriented x 3 today.   HEENT-head normocephalic, atraumatic, sclera clear, conjunctiva pink, hearing intact, trachea midline. Lungs- Clear to ausculation bilaterally, mildly labored work of breathing Heart- Regular rate and rhythm, occasional ectopic beat, no murmurs, rubs or gallops  GI- soft, NT, ND, + BS Extremities- no clubbing, cyanosis. 1+ bilateral edema MS- no significant deformity or atrophy Skin- no rash or lesion Psych- euthymic mood, full affect Neuro- strength and sensation are intact   Wt Readings from Last 3 Encounters:  11/13/19 108.3 kg  10/31/19 107 kg  10/23/19 108.5 kg    EKG today demonstrates sinus tach vs atrial tach HR 106, PACs, QRS 84, QTc 395  Echo 08/02/19 demonstrated   1. Left ventricular ejection fraction, by visual estimation, is 55 to 60%. The left ventricle has normal function. There is mildly increased left ventricular hypertrophy.  2. The left ventricle has no regional wall motion abnormalities.  3. Global right ventricle has normal systolic function.The right ventricular size is normal. No increase in right ventricular wall thickness.  4. Left atrial size was normal.  5. Right atrial size was normal.  6. The mitral valve is normal in structure. Trace mitral valve regurgitation. No evidence of mitral stenosis.  7. The tricuspid valve is normal in structure. Tricuspid valve regurgitation is mild.  8. The aortic valve is normal in structure. Aortic valve regurgitation is not visualized. Mild aortic valve sclerosis without stenosis.  9. There is Mild calcification of the aortic valve. 10. There is Mild thickening of the aortic valve. 11. The pulmonic valve was grossly normal. Pulmonic valve regurgitation is not visualized. 12. Moderately elevated pulmonary artery systolic pressure. 13. The inferior  vena cava is dilated in size with <50% respiratory variability, suggesting right atrial pressure of 15 mmHg.  Epic records are reviewed at  length today  Assessment and Plan:  1. Persistent atrial fibrillation/Atach Patient appears to be in sinus tach vs atrial tachycardia today. Continue amiodarone 200 daily. Increase diltiazem to 360 mg daily. Patient is not on anticoagulation 2/2 postmenopausal bleeding.  This patients CHA2DS2-VASc Score and unadjusted Ischemic Stroke Rate (% per year) is equal to 4.8 % stroke rate/year from a score of 4  Above score calculated as 1 point each if present [CHF, HTN, DM, Vascular=MI/PAD/Aortic Plaque, Age if 65-74, or Female] Above score calculated as 2 points each if present [Age > 75, or Stroke/TIA/TE]  2. Obesity Body mass index is 43.68 kg/m. Lifestyle modification was discussed and encouraged including regular physical activity and weight reduction.  3. HTN Elevated today and at home. Increase diltiazem as above.  4. Dyspnea PFTs pending. She does not appear fluid overloaded.  Will check CBC given h/o significant anemia.    Follow up in the AF clinic in one week. Dr Rayann Heman as scheduled.     [facility] [address] [place], [place] [address] [phone] 11/13/2019 2:44 PM

## 2019-11-13 NOTE — Telephone Encounter (Signed)
° °  Pt c/o Shortness Of Breath: STAT if SOB developed within the last 24 hours or pt is noticeably SOB on the phone  1. Are you currently SOB (can you hear that pt is SOB on the phone)? Yes  2. How long have you been experiencing SOB? Since this morning   3. Are you SOB when sitting or when up moving around? Moving around  4. Are you currently experiencing any other symptoms? Heart is beating fast BP 204/95 HR 99, pt said she took amiodarone (PACERONE) 200 MG tablet and   diltiazem (CARDIZEM CD) 300 MG 24 hr capsule  And it's not working she still feel SOB

## 2019-11-13 NOTE — Telephone Encounter (Signed)
Patient had "vertigo" yesterday when questioned her BP was 198/90 yesterday. Hasn't had the energy to weigh herself but doesn't feel like her legs are swollen like they were. Pt to come in for assessment this afternoon.

## 2019-11-13 NOTE — Telephone Encounter (Signed)
Spoke with the patient who states she has gotten increasingly SOB. She checked her BP while on the phone with me and it was 207/94 with HR of 97.  Pt has not eaten anything this AM and has been taking her medications regularly.   Sent to [employer] RN and Pecolia Ades PA for advisement.

## 2019-11-14 ENCOUNTER — Telehealth: Payer: Self-pay | Admitting: Family Medicine

## 2019-11-14 NOTE — Telephone Encounter (Signed)
error 

## 2019-11-14 NOTE — Telephone Encounter (Signed)
Pt would like a call back in regards to her specialist appt yesterday. Please follow up when possible

## 2019-11-15 NOTE — Telephone Encounter (Signed)
Patient was called and a voicemail was left informing patient to return phone call for more information.

## 2019-11-20 ENCOUNTER — Other Ambulatory Visit (HOSPITAL_COMMUNITY): Payer: Self-pay | Admitting: Physician Assistant

## 2019-11-20 ENCOUNTER — Ambulatory Visit (HOSPITAL_COMMUNITY): Payer: Medicare Other | Admitting: Physician Assistant

## 2019-11-25 ENCOUNTER — Ambulatory Visit (HOSPITAL_COMMUNITY)
Admission: RE | Admit: 2019-11-25 | Discharge: 2019-11-25 | Disposition: A | Discharge status: Home / Self Care | Payer: Medicare Other | Source: Ambulatory Visit | Attending: Physician Assistant | Admitting: Physician Assistant

## 2019-11-25 ENCOUNTER — Other Ambulatory Visit: Payer: Self-pay

## 2019-11-25 ENCOUNTER — Encounter (HOSPITAL_COMMUNITY): Payer: Self-pay | Admitting: Physician Assistant

## 2019-11-25 ENCOUNTER — Institutional Professional Consult (permissible substitution): Payer: Medicare Other | Admitting: Internal Medicine

## 2019-11-25 VITALS — BP 160/70 | HR 95 | Ht 62.0 in | Wt 254.2 lb

## 2019-11-25 DIAGNOSIS — R0600 Dyspnea, unspecified: Secondary | ICD-10-CM | POA: Diagnosis not present

## 2019-11-25 DIAGNOSIS — Z882 Allergy status to sulfonamides status: Secondary | ICD-10-CM | POA: Diagnosis not present

## 2019-11-25 DIAGNOSIS — I10 Essential (primary) hypertension: Secondary | ICD-10-CM | POA: Insufficient documentation

## 2019-11-25 DIAGNOSIS — E669 Obesity, unspecified: Secondary | ICD-10-CM | POA: Insufficient documentation

## 2019-11-25 DIAGNOSIS — I4819 Other persistent atrial fibrillation: Secondary | ICD-10-CM | POA: Insufficient documentation

## 2019-11-25 DIAGNOSIS — J45909 Unspecified asthma, uncomplicated: Secondary | ICD-10-CM | POA: Insufficient documentation

## 2019-11-25 DIAGNOSIS — L409 Psoriasis, unspecified: Secondary | ICD-10-CM | POA: Diagnosis not present

## 2019-11-25 DIAGNOSIS — Z96653 Presence of artificial knee joint, bilateral: Secondary | ICD-10-CM | POA: Diagnosis not present

## 2019-11-25 DIAGNOSIS — H409 Unspecified glaucoma: Secondary | ICD-10-CM | POA: Insufficient documentation

## 2019-11-25 DIAGNOSIS — D6869 Other thrombophilia: Secondary | ICD-10-CM | POA: Diagnosis not present

## 2019-11-25 DIAGNOSIS — M109 Gout, unspecified: Secondary | ICD-10-CM | POA: Diagnosis not present

## 2019-11-25 DIAGNOSIS — I4891 Unspecified atrial fibrillation: Secondary | ICD-10-CM | POA: Diagnosis present

## 2019-11-25 DIAGNOSIS — Z6841 Body Mass Index (BMI) 40.0 and over, adult: Secondary | ICD-10-CM | POA: Diagnosis not present

## 2019-11-25 DIAGNOSIS — Z9049 Acquired absence of other specified parts of digestive tract: Secondary | ICD-10-CM | POA: Diagnosis not present

## 2019-11-25 DIAGNOSIS — Z888 Allergy status to other drugs, medicaments and biological substances status: Secondary | ICD-10-CM | POA: Diagnosis not present

## 2019-11-25 DIAGNOSIS — Z79899 Other long term (current) drug therapy: Secondary | ICD-10-CM | POA: Diagnosis not present

## 2019-11-25 DIAGNOSIS — R600 Localized edema: Secondary | ICD-10-CM | POA: Diagnosis not present

## 2019-11-25 LAB — BASIC METABOLIC PANEL
Anion gap: 8 (ref 5–15)
BUN: 12 mg/dL (ref 8–23)
CO2: 24 mmol/L (ref 22–32)
Calcium: 8.5 mg/dL — ABNORMAL LOW (ref 8.9–10.3)
Chloride: 110 mmol/L (ref 98–111)
Creatinine, Ser: 2.14 mg/dL — ABNORMAL HIGH (ref 0.44–1.00)
GFR calc Af Amer: 25 mL/min — ABNORMAL LOW (ref >60)
GFR calc non Af Amer: 21 mL/min — ABNORMAL LOW (ref >60)
Glucose, Bld: 128 mg/dL — ABNORMAL HIGH (ref 70–99)
Potassium: 4.1 mmol/L (ref 3.5–5.1)
Sodium: 142 mmol/L (ref 135–145)

## 2019-11-25 NOTE — Patient Instructions (Addendum)
Increase lasix to 26m a day and 3 tablets of potassium until follow up with Dr. ARayann Hemanon 3/22.

## 2019-11-25 NOTE — Progress Notes (Signed)
Primary Care Physician: [name], MD Primary Cardiologist: none Primary Electrophysiologist: Dr Rayann Heman Referring Physician: Dr Heber [place]   Jackie Parker is a 79 y.o. 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 Patient was brought to the ED due to atrial fibrillation with rapid ventricular response from a preop appointment for an D&C to treat abnormal uterine bleeding. EKG on arrival showed atrial fibrillation with RVR, with HR in the 180s. Patient was rate controlled with diltiazem. She underwent D&C during her admission. Per GYN recommendations, she was not started on anticoagulation for a CHADS2VASC score of 4. She does admit to snoring, witnessed apnea, and daytime somnolence. She denies significant alcohol use.   On follow up today, patient reports that her BP and heart rate have been better controlled since increasing the diltiazem. Her BP readings have been in the 130s/80s. She does note weight gain and increased lower extremity edema. She has PFTs scheduled for this week.  Today, she denies symptoms of palpitations, chest pain, orthopnea, PND, dizziness, presyncope, syncope, or neurologic sequela. The patient is tolerating medications without difficulties and is otherwise without complaint today.    Atrial Fibrillation Risk Factors:  she does have symptoms or diagnosis of sleep apnea. she does not have a history of rheumatic fever. she does not have a history of alcohol use. The patient does not have a history of early familial atrial fibrillation or other arrhythmias.  she has a BMI of Body mass index is 46.49 kg/m.Marland Kitchen Filed Weights   11/25/19 1427  Weight: 115.3 kg    Family  History  Problem Relation Age of Onset  . Hypertension Sister   . Glaucoma Sister   . Endometrial cancer Neg Hx   . Ovarian cancer Neg Hx      Atrial Fibrillation Management history:  Previous antiarrhythmic drugs: amiodarone Previous cardioversions: none Previous ablations: none CHADS2VASC score: 4 Anticoagulation history: none   Past Medical History:  Diagnosis Date  . AKI (acute kidney injury) ([place]) 12/2015  . Allergy    takes Singulair and Zyrtec daily  . Asthma    uses Adair daily  . Cataracts, bilateral   . Colitis, ulcerative ([place])   . DJD (degenerative joint disease)   . Glaucoma    uses eye drops daily  . History of blood transfusion    no abnormal reaction noted. 40+ yrs ago  . History of bronchitis    yrs ago  . History of gout    not on any meds  . Hypertension    takes Amlodipine daily  . Joint pain   . Joint swelling   . PMB (postmenopausal bleeding)   . Psoriasis   . Urinary frequency    takes Ditropan daily  . Vertigo    doesn't take any meds  . Vitamin D deficiency    Past Surgical History:  Procedure Laterality Date  . BACK SURGERY    . COLONOSCOPY    . DILATATION & CURETTAGE/HYSTEROSCOPY WITH MYOSURE N/A 08/05/2019   Procedure: DILATATION & CURETTAGE/HYSTEROSCOPY WITH MYOSURE;  Surgeon: Christophe Louis, MD;  Location: [facility];  Service: Gynecology;  Laterality: N/A;  Myosure rep will be here confirmed on 08/01/19 CS  . ILEOSTOMY    . KNEE ARTHROPLASTY Left 12/14/2015   Procedure: COMPUTER ASSISTED  LEFT TOTAL KNEE ARTHROPLASTY;  Surgeon: Jessy Oto, MD;  Location: [place];  Service: Orthopedics;  Laterality: Left;  . TOTAL COLECTOMY      total colectomy in 1977  . TOTAL KNEE ARTHROPLASTY Left 12/14/2015  . TOTAL KNEE ARTHROPLASTY Right 06/27/2016  . TOTAL KNEE ARTHROPLASTY Right 06/27/2016   Procedure: RIGHT TOTAL KNEE ARTHROPLASTY;  Surgeon: Jessy Oto, MD;  Location: [facility];  Service: Orthopedics;  Laterality: Right;  . TUBAL LIGATION       Current Outpatient Medications  Medication Sig Dispense Refill  . allopurinol (ZYLOPRIM) 100 MG tablet TAKE 1 TABLET(100 MG) BY MOUTH DAILY 30 tablet 1  . amiodarone (PACERONE) 200 MG tablet Take 1 tablet (200 mg total) by mouth daily. 30 tablet 3  . brimonidine (ALPHAGAN) 0.2 % ophthalmic solution Place 1 drop into the right eye 3 (three) times daily.    . brinzolamide (AZOPT) 1 % ophthalmic suspension Place 1 drop into the right eye 3 (three) times daily.     . cetirizine (ZYRTEC) 10 MG tablet TAKE 1 TABLET(10 MG) BY MOUTH DAILY 30 tablet 2  . colchicine 0.6 MG tablet Take 2 tabs (1.2 mg) at the onset of a gout attack, may repeat 1 tab (0.6 mg) 2 hours later if symptoms persist. 30 tablet 1  . diltiazem (CARDIZEM CD) 360 MG 24 hr capsule Take 1 capsule (360 mg total) by mouth daily. 30 capsule 11  . FLOVENT HFA 110 MCG/ACT inhaler INHALE 2 PUFFS INTO THE LUNGS TWICE DAILY 12 g 2  . fluocinonide ointment (LIDEX) 2.33 % Apply 1 application topically 2 (two) times daily as needed for itching.    . furosemide (LASIX) 20 MG tablet Take 2 tablets by mouth daily for 3 days then reduce to 1 tablet daily 90 tablet 1  . LUMIGAN 0.01 % SOLN Place 1 drop into both eyes at bedtime.    . megestrol (MEGACE) 40 MG tablet Take 1 tablet (40 mg total) by mouth 2 (two) times daily. 60 tablet 11  . potassium chloride (KLOR-CON) 10 MEQ tablet Take 2 tablets (20 mEq total) by mouth daily. 180 tablet 2  . PROAIR HFA 108 (90 Base) MCG/ACT inhaler INHALE 2 PUFFS INTO THE LUNGS EVERY 6 HOURS AS NEEDED FOR WHEEZING OR SHORTNESS OF BREATH 8.5 g 2  . prochlorperazine (COMPAZINE) 10 MG tablet TAKE 1 TABLET(10 MG) BY MOUTH TWICE DAILY AS NEEDED FOR NAUSEA OR VOMITING 30 tablet 0   Current Facility-Administered Medications  Medication Dose Route Frequency Provider Last Rate Last Admin  . indomethacin (INDOCIN) capsule 25 mg  25 mg Oral BID WC Jessy Oto, MD        Allergies  Allergen Reactions  . Megace Es  [Megestrol Acetate] Shortness Of Breath  . Sulfonamide Derivatives Rash    Social History   Socioeconomic History  . Marital status: Single    Spouse name: Not on file  . Number of children: Not on file  . Years of education: Not on file  . Highest education level: Not on file  Occupational History  . Not on file  Tobacco Use  . Smoking status: Never Smoker  . Smokeless tobacco: Never Used  Substance and Sexual Activity  . Alcohol use: No    Alcohol/week: 0.0 standard drinks  . Drug use: No  . Sexual activity: Not Currently    Birth control/protection: Post-menopausal  Other Topics Concern  . Not on file  Social History Narrative  . Not on file  Social Determinants of Health   Financial Resource Strain:   . Difficulty of Paying Living Expenses:   Food Insecurity:   . Worried About Charity fundraiser in the Last Year:   . Arboriculturist in the Last Year:   Transportation Needs:   . Film/video editor (Medical):   Marland Kitchen Lack of Transportation (Non-Medical):   Physical Activity:   . Days of Exercise per Week:   . Minutes of Exercise per Session:   Stress:   . Feeling of Stress :   Social Connections:   . Frequency of Communication with Friends and Family:   . Frequency of Social Gatherings with Friends and Family:   . Attends Religious Services:   . Active Member of Clubs or Organizations:   . Attends Archivist Meetings:   Marland Kitchen Marital Status:   Intimate Partner Violence:   . Fear of Current or Ex-Partner:   . Emotionally Abused:   Marland Kitchen Physically Abused:   . Sexually Abused:      ROS- All systems are reviewed and negative except as per the HPI above.  Physical Exam: Vitals:   11/25/19 1427  BP: (!) 160/70  Pulse: 95  Weight: 115.3 kg  Height: 5' 2"  (1.575 m)    GEN- The patient is well appearing obese elderly female, alert and oriented x 3 today.   HEENT-head normocephalic, atraumatic, sclera clear, conjunctiva pink, hearing intact, trachea  midline. Lungs- Mild wheezing bilaterally, normal work of breathing Heart- Regular rate and rhythm, no murmurs, rubs or gallops  GI- soft, NT, ND, + BS Extremities- no clubbing, cyanosis. 1-2+ bilateral edema MS- no significant deformity or atrophy Skin- no rash or lesion Psych- euthymic mood, full affect Neuro- strength and sensation are intact   Wt Readings from Last 3 Encounters:  11/25/19 115.3 kg  11/13/19 108.3 kg  10/31/19 107 kg    EKG today demonstrates SR HR 95, PAC, PR 160, QRS 78, QTc 454  Echo 08/02/19 demonstrated   1. Left ventricular ejection fraction, by visual estimation, is 55 to 60%. The left ventricle has normal function. There is mildly increased left ventricular hypertrophy.  2. The left ventricle has no regional wall motion abnormalities.  3. Global right ventricle has normal systolic function.The right ventricular size is normal. No increase in right ventricular wall thickness.  4. Left atrial size was normal.  5. Right atrial size was normal.  6. The mitral valve is normal in structure. Trace mitral valve regurgitation. No evidence of mitral stenosis.  7. The tricuspid valve is normal in structure. Tricuspid valve regurgitation is mild.  8. The aortic valve is normal in structure. Aortic valve regurgitation is not visualized. Mild aortic valve sclerosis without stenosis.  9. There is Mild calcification of the aortic valve. 10. There is Mild thickening of the aortic valve. 11. The pulmonic valve was grossly normal. Pulmonic valve regurgitation is not visualized. 12. Moderately elevated pulmonary artery systolic pressure. 13. The inferior vena cava is dilated in size with <50% respiratory variability, suggesting right atrial pressure of 15 mmHg.  Epic records are reviewed at length today  Assessment and Plan:  1. Persistent atrial fibrillation/Atach Patient in [place] today. Heart rate has been within the normal range at home. Continue amiodarone 200 daily.  PFTs scheduled.  Continue diltiazem to 360 mg daily. Patient is not on anticoagulation 2/2 postmenopausal bleeding.  This patients CHA2DS2-VASc Score and unadjusted Ischemic Stroke Rate (% per year) is equal to 4.8 % stroke  rate/year from a score of 4  Above score calculated as 1 point each if present [CHF, HTN, DM, Vascular=MI/PAD/Aortic Plaque, Age if 65-74, or Female] Above score calculated as 2 points each if present [Age > 75, or Stroke/TIA/TE]  2. Obesity Body mass index is 46.49 kg/m. Lifestyle modification was discussed and encouraged including regular physical activity and weight reduction.  3. HTN Stable, no changes today.  4. Dyspnea/Lower extremity edema PFTs pending. Increase Lasix to 40 mg daily until follow up.  Check bmet Increase K+ to 30 meq daily with increased Lasix. 2 gram sodium diet.   Follow up with Dr Rayann Heman as scheduled.      [facility] [address] [place], [place] [address] [phone] 11/25/2019 2:55 PM

## 2019-11-27 ENCOUNTER — Encounter: Payer: Self-pay | Admitting: Pulmonary Disease

## 2019-11-27 ENCOUNTER — Other Ambulatory Visit: Payer: Self-pay

## 2019-11-27 ENCOUNTER — Ambulatory Visit (INDEPENDENT_AMBULATORY_CARE_PROVIDER_SITE_OTHER): Payer: Medicare Other | Admitting: Pulmonary Disease

## 2019-11-27 ENCOUNTER — Ambulatory Visit (INDEPENDENT_AMBULATORY_CARE_PROVIDER_SITE_OTHER): Payer: Medicare Other

## 2019-11-27 VITALS — BP 165/78 | HR 96 | Temp 97.2°F | Ht 62.0 in | Wt 251.0 lb

## 2019-11-27 DIAGNOSIS — G4733 Obstructive sleep apnea (adult) (pediatric): Secondary | ICD-10-CM

## 2019-11-27 DIAGNOSIS — R0602 Shortness of breath: Secondary | ICD-10-CM | POA: Diagnosis not present

## 2019-11-27 MED ORDER — FLUTICASONE-SALMETEROL 500-50 MCG/DOSE IN AEPB: 1.0000 | INHALATION_SPRAY | Freq: Two times a day (BID) | RESPIRATORY_TRACT | 2 refills | Status: AC

## 2019-11-27 MED ORDER — PREDNISONE 10 MG PO TABS: 10.0000 mg | ORAL_TABLET | Freq: Every day | ORAL | 0 refills | Status: DC

## 2019-11-27 MED ORDER — ALBUTEROL SULFATE (2.5 MG/3ML) 0.083% IN NEBU: 2.5000 mg | INHALATION_SOLUTION | Freq: Four times a day (QID) | RESPIRATORY_TRACT | 6 refills | Status: DC | PRN

## 2019-11-27 NOTE — Progress Notes (Signed)
Subjective:    Patient ID: Jackie Parker, female    DOB: [birth year], 79 y.o.   MRN: [id number]  Patient with shortness of breath worsening over the last few months  Only able to take a few steps Recently hospitalized for atrial fibrillation  She does have a past history of asthma  She is short of breath with minimal activity She does have associated wheezing Uses Flovent and albuterol  Never smoker  Admits to snoring, no witnessed apneas She is sleepy tired during the day  More concerned about her shortness of breath than anything else   Past Medical History:  Diagnosis Date  . AKI (acute kidney injury) ([place]) 12/2015  . Allergy    takes Singulair and Zyrtec daily  . Asthma    uses Adair daily  . Cataracts, bilateral   . Colitis, ulcerative ([place])   . DJD (degenerative joint disease)   . Glaucoma    uses eye drops daily  . History of blood transfusion    no abnormal reaction noted. 40+ yrs ago  . History of bronchitis    yrs ago  . History of gout    not on any meds  . Hypertension    takes Amlodipine daily  . Joint pain   . Joint swelling   . PMB (postmenopausal bleeding)   . Psoriasis   . Urinary frequency    takes Ditropan daily  . Vertigo    doesn't take any meds  . Vitamin D deficiency    Social History   Socioeconomic History  . Marital status: Single    Spouse name: Not on file  . Number of children: Not on file  . Years of education: Not on file  . Highest education level: Not on file  Occupational History  . Not on file  Tobacco Use  . Smoking status: Never Smoker  . Smokeless tobacco: Never Used  Substance and Sexual Activity  . Alcohol use: No    Alcohol/week: 0.0 standard drinks  . Drug use: No  . Sexual activity: Not Currently    Birth control/protection: Post-menopausal  Other Topics Concern  . Not on file  Social History Narrative  . Not on file   Social Determinants of Health   Financial Resource Strain:   . Difficulty  of Paying Living Expenses:   Food Insecurity:   . Worried About Charity fundraiser in the Last Year:   . Arboriculturist in the Last Year:   Transportation Needs:   . Film/video editor (Medical):   Marland Kitchen Lack of Transportation (Non-Medical):   Physical Activity:   . Days of Exercise per Week:   . Minutes of Exercise per Session:   Stress:   . Feeling of Stress :   Social Connections:   . Frequency of Communication with Friends and Family:   . Frequency of Social Gatherings with Friends and Family:   . Attends Religious Services:   . Active Member of Clubs or Organizations:   . Attends Archivist Meetings:   Marland Kitchen Marital Status:   Intimate Partner Violence:   . Fear of Current or Ex-Partner:   . Emotionally Abused:   Marland Kitchen Physically Abused:   . Sexually Abused:    Family History  Problem Relation Age of Onset  . Hypertension Sister   . Glaucoma Sister   . Endometrial cancer Neg Hx   . Ovarian cancer Neg Hx       Review of Systems  Constitutional:  Positive for unexpected weight change. Negative for fever.  HENT: Positive for sore throat and trouble swallowing. Negative for congestion, dental problem, ear pain, nosebleeds, postnasal drip, rhinorrhea, sinus pressure and sneezing.   Eyes: Negative for redness and itching.  Respiratory: Positive for shortness of breath. Negative for cough, chest tightness and wheezing.   Cardiovascular: Positive for palpitations. Negative for leg swelling.  Gastrointestinal: Negative for nausea and vomiting.  Genitourinary: Negative for dysuria.  Musculoskeletal: Negative for joint swelling.  Skin: Negative for rash.  Allergic/Immunologic: Negative.  Negative for environmental allergies, food allergies and immunocompromised state.  Neurological: Negative for headaches.  Hematological: Does not bruise/bleed easily.  Psychiatric/Behavioral: Negative for dysphoric mood. The patient is not nervous/anxious.        Objective:   Physical  Exam Constitutional:      Appearance: She is obese.  HENT:     Head: Normocephalic.     Nose: Nose normal. No congestion.     Mouth/Throat:     Mouth: Mucous membranes are moist.  Eyes:     Extraocular Movements: Extraocular movements intact.     Pupils: Pupils are equal, round, and reactive to light.  Cardiovascular:     Rate and Rhythm: Normal rate and regular rhythm.     Pulses: Normal pulses.     Heart sounds: No murmur.  Pulmonary:     Effort: Pulmonary effort is normal. No respiratory distress.     Breath sounds: Normal breath sounds. No stridor. No wheezing or rhonchi.  Musculoskeletal:     Cervical back: Normal range of motion and neck supple. No rigidity or tenderness.  Neurological:     Mental Status: She is alert.     Vitals:   11/27/19 1436  BP: (!) 165/78  Pulse: 96  Temp: (!) 97.2 F (36.2 C)  SpO2: 100%   Chest x-ray significant for pulmonary vascular congestion, cardiomegaly some atelectasis    Assessment & Plan:  .  Shortness of breath .  Possible exacerbation of asthma .  Atrial fibrillation .  Possible obstructive sleep apnea  -Prednisone 20 mg daily for 5 days then 10 mg daily for another 5 days -Repeat chest x-ray today shows no worsening of congestion, currently on diuretics -Switch from Flovent to Advair 501 puff twice daily -Nebulizer -Albuterol nebulization 4 times daily as needed -Schedule patient for PFT -Schedule for in lab split-night study  -I will see her back in the office in about 4 weeks -Encouraged to call with any significant concerns

## 2019-11-27 NOTE — Patient Instructions (Addendum)
Shortness of breath  -We will get a chest x-ray today -Prednisone 20 mg daily for 5 days then 10 mg daily for 5 days and stop  Switch from Flovent to Advair 500-1 puff twice a day  Nebulizer  Albuterol to going to nebulizer 4 times daily as needed  Schedule for PFT  Schedule for sleep study-in lab study  I will see you in about 4 weeks

## 2019-11-30 DIAGNOSIS — J4541 Moderate persistent asthma with (acute) exacerbation: Secondary | ICD-10-CM | POA: Diagnosis not present

## 2019-12-02 ENCOUNTER — Telehealth: Payer: Self-pay | Admitting: Internal Medicine

## 2019-12-02 ENCOUNTER — Other Ambulatory Visit: Payer: Self-pay

## 2019-12-02 ENCOUNTER — Ambulatory Visit (INDEPENDENT_AMBULATORY_CARE_PROVIDER_SITE_OTHER): Payer: Medicare Other | Admitting: Internal Medicine

## 2019-12-02 ENCOUNTER — Encounter: Payer: Self-pay | Admitting: Internal Medicine

## 2019-12-02 VITALS — BP 174/84 | HR 88 | Ht 62.0 in | Wt 243.2 lb

## 2019-12-02 DIAGNOSIS — Z79899 Other long term (current) drug therapy: Secondary | ICD-10-CM | POA: Diagnosis not present

## 2019-12-02 DIAGNOSIS — D6869 Other thrombophilia: Secondary | ICD-10-CM

## 2019-12-02 DIAGNOSIS — I4819 Other persistent atrial fibrillation: Secondary | ICD-10-CM

## 2019-12-02 DIAGNOSIS — R0602 Shortness of breath: Secondary | ICD-10-CM | POA: Diagnosis not present

## 2019-12-02 DIAGNOSIS — I119 Hypertensive heart disease without heart failure: Secondary | ICD-10-CM

## 2019-12-02 DIAGNOSIS — I5033 Acute on chronic diastolic (congestive) heart failure: Secondary | ICD-10-CM | POA: Diagnosis not present

## 2019-12-02 MED ORDER — FUROSEMIDE 40 MG PO TABS: 40.0000 mg | ORAL_TABLET | Freq: Every day | ORAL | 2 refills | Status: DC

## 2019-12-02 NOTE — Telephone Encounter (Signed)
Follow up:    Patient returning from early today. Please call patient back.

## 2019-12-02 NOTE — Progress Notes (Signed)
PCP: [name], MD   Primary EP: Dr Rayann Heman  Jackie Parker is a 79 y.o. female who presents today for routine electrophysiology followup.  Since last being seen in our clinic, the patient reports doing reasonably well.  She is not very active.  + SOB and edema chronically.  She has had marked wheezing recently and has been placed on prednisone and bronchodilators by Dr Ander Slade.  She is making very slow improvement.  Today, she denies symptoms of palpitations, chest pain,  dizziness, presyncope, or syncope.  The patient is otherwise without complaint today.   Past Medical History:  Diagnosis Date  . AKI (acute kidney injury) ([place]) 12/2015  . Allergy    takes Singulair and Zyrtec daily  . Asthma    uses Adair daily  . Cataracts, bilateral   . Colitis, ulcerative ([place])   . DJD (degenerative joint disease)   . Glaucoma    uses eye drops daily  . History of blood transfusion    no abnormal reaction noted. 40+ yrs ago  . History of bronchitis    yrs ago  . History of gout    not on any meds  . Hypertension    takes Amlodipine daily  . Joint pain   . Joint swelling   . PMB (postmenopausal bleeding)   . Psoriasis   . Urinary frequency    takes Ditropan daily  . Vertigo    doesn't take any meds  . Vitamin D deficiency    Past Surgical History:  Procedure Laterality Date  . BACK SURGERY    . COLONOSCOPY    . DILATATION & CURETTAGE/HYSTEROSCOPY WITH MYOSURE N/A 08/05/2019   Procedure: DILATATION & CURETTAGE/HYSTEROSCOPY WITH MYOSURE;  Surgeon: Christophe Louis, MD;  Location: [facility];  Service: Gynecology;  Laterality: N/A;  Myosure rep will be here confirmed on 08/01/19 CS  . ILEOSTOMY    . KNEE ARTHROPLASTY Left 12/14/2015   Procedure: COMPUTER ASSISTED LEFT TOTAL KNEE ARTHROPLASTY;  Surgeon: Jessy Oto, MD;  Location: [facility];  Service: Orthopedics;  Laterality: Left;  . TOTAL COLECTOMY      total colectomy in 1977  . TOTAL KNEE ARTHROPLASTY Left 12/14/2015  . TOTAL KNEE  ARTHROPLASTY Right 06/27/2016  . TOTAL KNEE ARTHROPLASTY Right 06/27/2016   Procedure: RIGHT TOTAL KNEE ARTHROPLASTY;  Surgeon: Jessy Oto, MD;  Location: [facility];  Service: Orthopedics;  Laterality: Right;  . TUBAL LIGATION      ROS- all systems are reviewed and negatives except as per HPI above  Current Outpatient Medications  Medication Sig Dispense Refill  . albuterol (PROVENTIL) (2.5 MG/3ML) 0.083% nebulizer solution Take 3 mLs (2.5 mg total) by nebulization every 6 (six) hours as needed for wheezing or shortness of breath. 75 mL 6  . allopurinol (ZYLOPRIM) 100 MG tablet TAKE 1 TABLET(100 MG) BY MOUTH DAILY 30 tablet 1  . amiodarone (PACERONE) 200 MG tablet Take 1 tablet (200 mg total) by mouth daily. 30 tablet 3  . brimonidine (ALPHAGAN) 0.2 % ophthalmic solution Place 1 drop into the right eye 3 (three) times daily.    . brinzolamide (AZOPT) 1 % ophthalmic suspension Place 1 drop into the right eye 3 (three) times daily.     . cetirizine (ZYRTEC) 10 MG tablet TAKE 1 TABLET(10 MG) BY MOUTH DAILY 30 tablet 2  . colchicine 0.6 MG tablet Take 2 tabs (1.2 mg) at the onset of a gout attack, may repeat 1 tab (0.6 mg) 2 hours later if symptoms persist.  30 tablet 1  . diltiazem (CARDIZEM CD) 360 MG 24 hr capsule Take 1 capsule (360 mg total) by mouth daily. 30 capsule 11  . fluocinonide ointment (LIDEX) 1.01 % Apply 1 application topically 2 (two) times daily as needed for itching.    . Fluticasone-Salmeterol (ADVAIR DISKUS) 500-50 MCG/DOSE AEPB Inhale 1 puff into the lungs in the morning and at bedtime. 60 each 2  . furosemide (LASIX) 20 MG tablet Take 2 tablets by mouth daily for 3 days then reduce to 1 tablet daily 90 tablet 1  . LUMIGAN 0.01 % SOLN Place 1 drop into both eyes at bedtime.    . megestrol (MEGACE) 40 MG tablet Take 1 tablet (40 mg total) by mouth 2 (two) times daily. 60 tablet 11  . potassium chloride (KLOR-CON) 10 MEQ tablet Take 2 tablets (20 mEq total) by mouth daily. 180  tablet 2  . predniSONE (DELTASONE) 10 MG tablet Take 1 tablet (10 mg total) by mouth daily with breakfast. 20 mg daily for 5 days then 10 mg daily for 5 days 30 tablet 0  . PROAIR HFA 108 (90 Base) MCG/ACT inhaler INHALE 2 PUFFS INTO THE LUNGS EVERY 6 HOURS AS NEEDED FOR WHEEZING OR SHORTNESS OF BREATH 8.5 g 2  . prochlorperazine (COMPAZINE) 10 MG tablet TAKE 1 TABLET(10 MG) BY MOUTH TWICE DAILY AS NEEDED FOR NAUSEA OR VOMITING 30 tablet 0   Current Facility-Administered Medications  Medication Dose Route Frequency Provider Last Rate Last Admin  . indomethacin (INDOCIN) capsule 25 mg  25 mg Oral BID WC Jessy Oto, MD        Physical Exam: Vitals:   12/02/19 1101  BP: (!) 174/84  Pulse: 88  SpO2: 92%  Weight: 243 lb 3.2 oz (110.3 kg)  Height: 5' 2"  (1.575 m)    GEN- The patient is morbidly obese and chronically ill appearing, alert and oriented x 3 today.   Head- normocephalic, atraumatic Eyes-  Sclera clear, conjunctiva pink Ears- hearing intact Oropharynx- clear Lungs- diffuse expiratory wheezes with poor air movement Heart- Regular rate and rhythm  GI- soft, NT, ND, + BS Extremities- no clubbing, cyanosis, + dependant edema  Wt Readings from Last 3 Encounters:  12/02/19 243 lb 3.2 oz (110.3 kg)  11/27/19 251 lb (113.9 kg)  11/25/19 254 lb 3.2 oz (115.3 kg)    EKG tracing 11/25/2019 reveal sinus rhythm with PACs  Assessment and Plan:  1. SOB/ reactive airway disease She has very poor air movement on exam and diffuse wheezing.  She has been evaluated recently by Dr Ander Slade and started on steroids. This appears to be primarily a respiratory issue.  Close follow-up with pulmonary team is necessary  2. Persistent afib Currently in sinus rhythm with amiodarone She will require regular follow-up to avoid toxicity with this medicine. lfts and tsh today.  Pulmonary to follow PFTs Consider reducing amiodarone to 163m daily if she remains in sinus on follow-up with general  cardiology team chads2vasc score is at least 4.  Not currently felt to be a candidate for anticoagulation  3. Obesity Body mass index is 44.48 kg/m. Lifestyle modification is encouraged  4. Hypertensive cardiovascular disease Elevated today Lifestyle modification is advised  5. Chronic diastolic dysfunction We will check pro BNP as a screen for diastolic dysfunction.  If abnormal, we could consider Alleviate HF study.   Return to see general cardiology APP in 2 months  I will see in 6 months    JThompson GrayerMD, [facility]  12/02/2019 11:50 AM

## 2019-12-02 NOTE — Telephone Encounter (Signed)
Pt calling to see if Dr. Rayann Heman would order a "purewick" catheter for her at home. Pt advised she will need to speak w/ her PCP about any DME equipment request and that we would not order from an EP standpoint. Pt agreeable and will discuss w/ her PCP.

## 2019-12-02 NOTE — Patient Instructions (Signed)
Medication Instructions:  Your physician has recommended you make the following change in your medication:  1. INCREASE Lasix to 40 mg daily  *If you need a refill on your cardiac medications before your next appointment, please call your pharmacy*   Lab Work: Today: LFTs, TSH, proBNP If you have labs (blood work) drawn today and your tests are completely normal, you will receive your results only by: Marland Kitchen MyChart Message (if you have MyChart) OR . A paper copy in the mail If you have any lab test that is abnormal or we need to change your treatment, we will call you to review the results.   Testing/Procedures: None ordered   Follow-Up: At [facility], you and your health needs are our priority.  As part of our continuing mission to provide you with exceptional heart care, we have created designated Provider Care Teams.  These Care Teams include your primary Cardiologist (physician) and Advanced Practice Providers (APPs -  Physician Assistants and Nurse Practitioners) who all work together to provide you with the care you need, when you need it.  We recommend signing up for the patient portal called "MyChart".  Sign up information is provided on this After Visit Summary.  MyChart is used to connect with patients for Virtual Visits (Telemedicine).  Patients are able to view lab/test results, encounter notes, upcoming appointments, etc.  Non-urgent messages can be sent to your provider as well.   To learn more about what you can do with MyChart, go to [email or web address].    Your next appointment:   2 month(s)  The format for your next appointment:   In Person  Provider:   You will see one of the following Advanced Practice Providers on your designated Care Team:    Richardson Dopp, [place]   Your physician recommends that you schedule a follow-up appointment in: 4 months with Dr. Rayann Heman.

## 2019-12-02 NOTE — Telephone Encounter (Signed)
lmtcb

## 2019-12-02 NOTE — Telephone Encounter (Signed)
Patient wants Dr. Rayann Heman to order her something. She would like the nurse to give her a call.

## 2019-12-03 LAB — HEPATIC FUNCTION PANEL
ALT: 13 IU/L (ref 0–32)
AST: 24 IU/L (ref 0–40)
Albumin: 3.2 g/dL — ABNORMAL LOW (ref 3.7–4.7)
Alkaline Phosphatase: 69 IU/L (ref 39–117)
Bilirubin Total: 0.8 mg/dL (ref 0.0–1.2)
Bilirubin, Direct: 0.4 mg/dL (ref 0.00–0.40)
Total Protein: 7.6 g/dL (ref 6.0–8.5)

## 2019-12-03 LAB — PRO B NATRIURETIC PEPTIDE: NT-Pro BNP: 779 pg/mL — ABNORMAL HIGH (ref 0–738)

## 2019-12-03 LAB — TSH: TSH: 1.61 u[IU]/mL (ref 0.450–4.500)

## 2019-12-06 ENCOUNTER — Telehealth: Payer: Self-pay

## 2019-12-06 DIAGNOSIS — E876 Hypokalemia: Secondary | ICD-10-CM

## 2019-12-06 MED ORDER — POTASSIUM CHLORIDE ER 10 MEQ PO TBCR: 40.0000 meq | EXTENDED_RELEASE_TABLET | Freq: Two times a day (BID) | ORAL | 0 refills | Status: DC

## 2019-12-06 NOTE — Telephone Encounter (Signed)
-----   Message from Deitra Mayo, RRT sent at 12/06/2019  9:22 AM EDT ----- Regarding: Alleviate Good morning, Her GFR <30 excludes her from [place].  ----- Message ----- From: Thompson Grayer, MD Sent: 12/05/2019   8:37 PM EDT To: Deitra Mayo, RRT, Jackqulyn Livings, RN, #  Results reviewed.  Sonia Baller, please inform pt of result. I will route to primary care also.  Increase Kdur to 40 meq BID x 5 days.  Repeat BMET in 7-10 days  Amy, please screen for Alleviate HF

## 2019-12-06 NOTE — Telephone Encounter (Signed)
Left detailed message for Pt.  Advised she needed to increase her potassium for 5 days.  Pt takes potassium 10 meq tablets.  Advised she needed to take 4 tablets by  Mouth twice a day for 5 days.  Advised I would need to speak with her to schedule repeat lab work.  Will send in prescription to pharmacy and continue to follow.  Requested call back to office.

## 2019-12-14 ENCOUNTER — Other Ambulatory Visit (HOSPITAL_COMMUNITY)
Admission: RE | Admit: 2019-12-14 | Discharge: 2019-12-14 | Disposition: A | Discharge status: Home / Self Care | Payer: Medicare Other | Source: Ambulatory Visit | Attending: Pulmonary Disease | Admitting: Pulmonary Disease

## 2019-12-14 DIAGNOSIS — Z01812 Encounter for preprocedural laboratory examination: Secondary | ICD-10-CM | POA: Diagnosis not present

## 2019-12-14 DIAGNOSIS — Z20822 Contact with and (suspected) exposure to covid-19: Secondary | ICD-10-CM | POA: Insufficient documentation

## 2019-12-14 LAB — SARS CORONAVIRUS 2 (TAT 6-24 HRS): SARS Coronavirus 2: NEGATIVE

## 2019-12-16 NOTE — Telephone Encounter (Signed)
Call placed to Pt.  Pt increased potassium as instructed.  Pt cannot come in for labs this week-agreeable to April 12.  Advised she can come in anytime to [facility] office for labs on April 12 and she does not need to be fasting.

## 2019-12-17 ENCOUNTER — Ambulatory Visit (HOSPITAL_BASED_OUTPATIENT_CLINIC_OR_DEPARTMENT_OTHER): Payer: Medicare Other | Attending: Pulmonary Disease | Admitting: Pulmonary Disease

## 2019-12-17 ENCOUNTER — Other Ambulatory Visit: Payer: Self-pay

## 2019-12-17 ENCOUNTER — Encounter (HOSPITAL_BASED_OUTPATIENT_CLINIC_OR_DEPARTMENT_OTHER): Payer: Self-pay | Admitting: Pulmonary Disease

## 2019-12-17 DIAGNOSIS — I493 Ventricular premature depolarization: Secondary | ICD-10-CM | POA: Diagnosis not present

## 2019-12-17 DIAGNOSIS — Z6841 Body Mass Index (BMI) 40.0 and over, adult: Secondary | ICD-10-CM | POA: Diagnosis not present

## 2019-12-17 DIAGNOSIS — G4736 Sleep related hypoventilation in conditions classified elsewhere: Secondary | ICD-10-CM | POA: Diagnosis not present

## 2019-12-17 DIAGNOSIS — R0902 Hypoxemia: Secondary | ICD-10-CM | POA: Insufficient documentation

## 2019-12-17 DIAGNOSIS — I10 Essential (primary) hypertension: Secondary | ICD-10-CM | POA: Insufficient documentation

## 2019-12-17 DIAGNOSIS — E669 Obesity, unspecified: Secondary | ICD-10-CM | POA: Insufficient documentation

## 2019-12-17 DIAGNOSIS — G4733 Obstructive sleep apnea (adult) (pediatric): Secondary | ICD-10-CM | POA: Diagnosis not present

## 2019-12-17 HISTORY — DX: Obstructive sleep apnea (adult) (pediatric): G47.33

## 2019-12-23 ENCOUNTER — Telehealth: Payer: Self-pay | Admitting: Internal Medicine

## 2019-12-23 ENCOUNTER — Other Ambulatory Visit: Payer: Self-pay

## 2019-12-23 ENCOUNTER — Other Ambulatory Visit: Payer: Medicare Other | Admitting: *Deleted

## 2019-12-23 ENCOUNTER — Telehealth: Payer: Self-pay

## 2019-12-23 DIAGNOSIS — E876 Hypokalemia: Secondary | ICD-10-CM

## 2019-12-23 LAB — BASIC METABOLIC PANEL
BUN/Creatinine Ratio: 7 — ABNORMAL LOW (ref 12–28)
BUN: 16 mg/dL (ref 8–27)
CO2: 22 mmol/L (ref 20–29)
Calcium: 8.1 mg/dL — ABNORMAL LOW (ref 8.7–10.3)
Chloride: 105 mmol/L (ref 96–106)
Creatinine, Ser: 2.41 mg/dL — ABNORMAL HIGH (ref 0.57–1.00)
GFR calc Af Amer: 22 mL/min/{1.73_m2} — ABNORMAL LOW (ref >59)
GFR calc non Af Amer: 19 mL/min/{1.73_m2} — ABNORMAL LOW (ref >59)
Glucose: 97 mg/dL (ref 65–99)
Potassium: 2.9 mmol/L — CL (ref 3.5–5.2)
Sodium: 142 mmol/L (ref 134–144)

## 2019-12-23 NOTE — Telephone Encounter (Signed)
Critical Potassium of 2.9 called in by [employer] patient back with Dr. Jackalyn Lombard advisement to increase potassium chloride to 27mq daily. She states she has not been feeling well and unable to swallow the pills but she will continue trying. I reiterated the importance of taking this medication as her Potassium is currently critically low. Pt verbalized understanding and will come in 01/01/20 for repeat lab work.  Let the patient know that she should will need a new appointment with a general cardiologist to manage in the future. I let her know that someone from our office would be in touch to schedule the appointment.

## 2019-12-23 NOTE — Telephone Encounter (Signed)
Jackie Parker from [facility] called with critical Potassium of 2.9.  Dr. Rayann Heman and his RN made aware.

## 2019-12-23 NOTE — Telephone Encounter (Signed)
New Message    Darrick Penna is calling with Critical lab results

## 2019-12-28 ENCOUNTER — Telehealth: Payer: Self-pay

## 2019-12-28 NOTE — Telephone Encounter (Signed)
Looks like patient had placed her CT on hold. Do you mind reaching out since it has been since Jan 2021? I know she was supposed to call us but not sure if she remembered etc

## 2019-12-29 ENCOUNTER — Telehealth: Payer: Self-pay | Admitting: Pulmonary Disease

## 2019-12-29 DIAGNOSIS — G4733 Obstructive sleep apnea (adult) (pediatric): Secondary | ICD-10-CM | POA: Diagnosis not present

## 2019-12-29 NOTE — Procedures (Signed)
  POLYSOMNOGRAPHY  Last, First: Jackie, Parker MRN: [id number] Gender: Female Age (years): 10 Weight (lbs): 243 DOB: April 25, 1941 BMI: 44 Primary Care: No PCP Epworth Score: 9 Referring: Laurin Coder MD Technician: Carolin Coy Interpreting: Laurin Coder MD Study Type: NPSG Ordered Study Type: Split Night CPAP Study date: 12/17/2019 Location: [facility] CLINICAL INFORMATION Jackie Parker is a 79 year old Female and was referred to the sleep center for evaluation of G47.33 OSA: Adult and Pediatric (327.23). Indications include Fatigue, Hypertension, Obesity, Snoring.  MEDICATIONS Patient self administered medications include: LUMIGAN. Medications administered during study include No sleep medicine administered.  SLEEP STUDY TECHNIQUE A multi-channel overnight Polysomnography study was performed. The channels recorded and monitored were central and occipital EEG, electrooculogram (EOG), submentalis EMG (chin), nasal and oral airflow, thoracic and abdominal wall motion, anterior tibialis EMG, snore microphone, electrocardiogram, and a pulse oximetry. TECHNICIAN COMMENTS Comments added by Technician: PATIENT WAS ORDERED AS A SPLIT NIGHT STUDY. Comments added by Scorer: N/A SLEEP ARCHITECTURE The study was initiated at 10:46:35 PM and terminated at 4:57:37 AM. The total recorded time was 371 minutes. EEG confirmed total sleep time was 69.5 minutes yielding a sleep efficiency of 18.7%%. Sleep onset after lights out was 10.6 minutes with a REM latency of 91.5 minutes. The patient spent 64.7%% of the night in stage N1 sleep, 34.5%% in stage N2 sleep, 0.0%% in stage N3 and 0.7% in REM. Wake after sleep onset (WASO) was 291.0 minutes. The Arousal Index was 84.6/hour. RESPIRATORY PARAMETERS There were a total of 68 respiratory disturbances out of which 21 were apneas ( 20 obstructive, 0 mixed, 1 central) and 47 hypopneas. The apnea/hypopnea index (AHI) was 58.7 events/hour. The  central sleep apnea index was 0.9 events/hour. The REM AHI was 120.0 events/hour and NREM AHI was 58.3 events/hour. The supine AHI was N/A events/hour and the non supine AHI was 58.7 supine during 0.00% of sleep. Respiratory disturbances were associated with oxygen desaturation down to a nadir of 84.0% during sleep. The mean oxygen saturation during the study was 90.2%. The cumulative time under 88% oxygen saturation was 5.5 minutes.  LEG MOVEMENT DATA The total leg movements were 0 with a resulting leg movement index of 0.0/hr .Associated arousal with leg movement index was 0.0/hr.  CARDIAC DATA The underlying cardiac rhythm was most consistent with sinus rhythm. Mean heart rate during sleep was 85.2 bpm. Additional rhythm abnormalities include PVCs.   IMPRESSIONS - Severe Obstructive Sleep apnea(OSA) - Poor sleep efficiency - Electrocardiographic data showed presence of PVCs. - Moderate Oxygen Desaturation - The patient snored with moderate snoring volume. - No significant periodic leg movements(PLMs) during sleep. However, no significant associated arousals.   DIAGNOSIS - Obstructive Sleep Apnea (327.23 [G47.33 ICD-10]) - Nocturnal Hypoxemia (327.26 [G47.36 ICD-10])   RECOMMENDATIONS - Therapeutic CPAP titration to determine optimal pressure required to alleviate sleep disordered breathing. - Autotitrating CPAP, pressure setting of 5-20 may be considered as option of treatment - Mask of patients choice to be used with CPAP - Avoid alcohol, sedatives and other CNS depressants that may worsen sleep apnea and disrupt normal sleep architecture. - Sleep hygiene should be reviewed to assess factors that may improve sleep quality. - Weight management and regular exercise should be initiated or continued.  [Electronically signed] 12/29/2019 05:38 PM  Sherrilyn Rist MD NPI: [id number]

## 2019-12-29 NOTE — Telephone Encounter (Signed)
Sleep study results  Date of study: 12/17/2019  Impression: Severe obstructive sleep apnea Moderate oxygen desaturations  Recommendation: - Autotitrating CPAP with heated humidification, pressure setting of 5-20 may be considered as option of treatment - Mask of patients choice to be used with CPAP  Follow-up in 4 to 6 weeks following initiation of CPAP therapy

## 2019-12-30 ENCOUNTER — Other Ambulatory Visit: Payer: Self-pay

## 2019-12-30 DIAGNOSIS — I10 Essential (primary) hypertension: Secondary | ICD-10-CM

## 2019-12-30 NOTE — Telephone Encounter (Signed)
Jackie Parker states that she cannot drink the oral contrast for a CT abdomen /pelvis.  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 She  States that she will do an MRI and or [place].

## 2019-12-30 NOTE — Telephone Encounter (Signed)
Pt calling to obtain sleep study results. Please advise.

## 2019-12-30 NOTE — Telephone Encounter (Signed)
Jackie Parker has not experienced any vaginal bleeding since the end of January when she was prescribed Megace by Dr. Denman George. Jackie Maynor states that she did not tolerate the megace and Dr. Landry Mellow ordered medroxyprogesterone 10 mg tabs with sig: take 2 tabs daily # 60 with 1 RF on 12-14-19

## 2019-12-30 NOTE — Telephone Encounter (Signed)
Advised pt of results. Pt understood and nothing further is needed.   CPAP order placed.

## 2019-12-31 DIAGNOSIS — Z933 Colostomy status: Secondary | ICD-10-CM | POA: Diagnosis not present

## 2019-12-31 DIAGNOSIS — J4541 Moderate persistent asthma with (acute) exacerbation: Secondary | ICD-10-CM | POA: Diagnosis not present

## 2020-01-01 ENCOUNTER — Other Ambulatory Visit: Payer: Self-pay

## 2020-01-01 ENCOUNTER — Other Ambulatory Visit: Payer: Medicare Other | Admitting: *Deleted

## 2020-01-01 DIAGNOSIS — E876 Hypokalemia: Secondary | ICD-10-CM | POA: Diagnosis not present

## 2020-01-01 NOTE — Telephone Encounter (Signed)
Pt is willing to have CT without oral contrast due to ileostomy. No IV Contrast due to elevated creatinine. CT scheduled for 01-08-20 at [facility]  @ 2 pm Arrival at 1345. Npo after 10 am.  She will see Dr. Denman George at 3:15 to discuss the results. Spoke with Tedra Coupe in radiology to have the CT stat  Read for appointment at 3:15.

## 2020-01-02 LAB — BASIC METABOLIC PANEL
BUN/Creatinine Ratio: 6 — ABNORMAL LOW (ref 12–28)
BUN: 13 mg/dL (ref 8–27)
CO2: 23 mmol/L (ref 20–29)
Calcium: 8.1 mg/dL — ABNORMAL LOW (ref 8.7–10.3)
Chloride: 109 mmol/L — ABNORMAL HIGH (ref 96–106)
Creatinine, Ser: 2.04 mg/dL — ABNORMAL HIGH (ref 0.57–1.00)
GFR calc Af Amer: 26 mL/min/{1.73_m2} — ABNORMAL LOW (ref >59)
GFR calc non Af Amer: 23 mL/min/{1.73_m2} — ABNORMAL LOW (ref >59)
Glucose: 95 mg/dL (ref 65–99)
Potassium: 3.6 mmol/L (ref 3.5–5.2)
Sodium: 146 mmol/L — ABNORMAL HIGH (ref 134–144)

## 2020-01-07 DIAGNOSIS — K5190 Ulcerative colitis, unspecified, without complications: Secondary | ICD-10-CM | POA: Diagnosis not present

## 2020-01-08 ENCOUNTER — Inpatient Hospital Stay: Payer: Medicare Other | Admitting: Gynecologic Oncology

## 2020-01-08 ENCOUNTER — Telehealth: Payer: Self-pay

## 2020-01-08 ENCOUNTER — Ambulatory Visit (HOSPITAL_COMMUNITY): Admission: RE | Admit: 2020-01-08 | Payer: Medicare Other | Source: Ambulatory Visit

## 2020-01-08 DIAGNOSIS — N950 Postmenopausal bleeding: Secondary | ICD-10-CM

## 2020-01-08 NOTE — Telephone Encounter (Signed)
Ms Pilger did not show for her CT Scan today as she received a message yesterday that her appointment was at 3:15 today.  It probably was the automated notification of the office appointment. R/S CT for 01-22-20 at 1 pm [facility]. And see Dr. Denman George at 3 pm to discuss the CT results. NPO after 0900 on 5-12 until scan is done. Ms Connolly will call if she has any questions about her appointments.

## 2020-01-20 ENCOUNTER — Encounter (HOSPITAL_COMMUNITY): Payer: Self-pay | Admitting: Physician Assistant

## 2020-01-20 ENCOUNTER — Other Ambulatory Visit: Payer: Self-pay

## 2020-01-20 ENCOUNTER — Ambulatory Visit (HOSPITAL_COMMUNITY)
Admission: RE | Admit: 2020-01-20 | Discharge: 2020-01-20 | Disposition: A | Discharge status: Home / Self Care | Payer: Medicare Other | Source: Ambulatory Visit | Attending: Physician Assistant | Admitting: Physician Assistant

## 2020-01-20 VITALS — BP 160/90 | HR 95 | Ht 62.0 in | Wt 253.0 lb

## 2020-01-20 DIAGNOSIS — H409 Unspecified glaucoma: Secondary | ICD-10-CM | POA: Diagnosis not present

## 2020-01-20 DIAGNOSIS — I471 Supraventricular tachycardia: Secondary | ICD-10-CM | POA: Diagnosis not present

## 2020-01-20 DIAGNOSIS — I4819 Other persistent atrial fibrillation: Secondary | ICD-10-CM

## 2020-01-20 DIAGNOSIS — M109 Gout, unspecified: Secondary | ICD-10-CM | POA: Insufficient documentation

## 2020-01-20 DIAGNOSIS — J45909 Unspecified asthma, uncomplicated: Secondary | ICD-10-CM | POA: Diagnosis not present

## 2020-01-20 DIAGNOSIS — I110 Hypertensive heart disease with heart failure: Secondary | ICD-10-CM | POA: Diagnosis not present

## 2020-01-20 DIAGNOSIS — Z96653 Presence of artificial knee joint, bilateral: Secondary | ICD-10-CM | POA: Diagnosis not present

## 2020-01-20 DIAGNOSIS — Z79899 Other long term (current) drug therapy: Secondary | ICD-10-CM | POA: Diagnosis not present

## 2020-01-20 DIAGNOSIS — R1310 Dysphagia, unspecified: Secondary | ICD-10-CM | POA: Insufficient documentation

## 2020-01-20 DIAGNOSIS — Z7951 Long term (current) use of inhaled steroids: Secondary | ICD-10-CM | POA: Insufficient documentation

## 2020-01-20 DIAGNOSIS — Z9049 Acquired absence of other specified parts of digestive tract: Secondary | ICD-10-CM | POA: Diagnosis not present

## 2020-01-20 DIAGNOSIS — E669 Obesity, unspecified: Secondary | ICD-10-CM | POA: Insufficient documentation

## 2020-01-20 DIAGNOSIS — Z888 Allergy status to other drugs, medicaments and biological substances status: Secondary | ICD-10-CM | POA: Insufficient documentation

## 2020-01-20 DIAGNOSIS — I4891 Unspecified atrial fibrillation: Secondary | ICD-10-CM | POA: Diagnosis present

## 2020-01-20 DIAGNOSIS — N950 Postmenopausal bleeding: Secondary | ICD-10-CM | POA: Diagnosis not present

## 2020-01-20 DIAGNOSIS — Z8249 Family history of ischemic heart disease and other diseases of the circulatory system: Secondary | ICD-10-CM | POA: Diagnosis not present

## 2020-01-20 DIAGNOSIS — D6869 Other thrombophilia: Secondary | ICD-10-CM

## 2020-01-20 DIAGNOSIS — L409 Psoriasis, unspecified: Secondary | ICD-10-CM | POA: Insufficient documentation

## 2020-01-20 DIAGNOSIS — G4733 Obstructive sleep apnea (adult) (pediatric): Secondary | ICD-10-CM | POA: Insufficient documentation

## 2020-01-20 DIAGNOSIS — I5032 Chronic diastolic (congestive) heart failure: Secondary | ICD-10-CM | POA: Diagnosis not present

## 2020-01-20 DIAGNOSIS — Z6841 Body Mass Index (BMI) 40.0 and over, adult: Secondary | ICD-10-CM | POA: Diagnosis not present

## 2020-01-20 DIAGNOSIS — Z882 Allergy status to sulfonamides status: Secondary | ICD-10-CM | POA: Diagnosis not present

## 2020-01-20 NOTE — Patient Instructions (Signed)
Call your primary care today regarding swallowing issues.

## 2020-01-20 NOTE — Progress Notes (Signed)
Primary Care Physician: [name], MD Primary Cardiologist: none Primary Electrophysiologist: Dr Rayann Heman Referring Physician: Dr Heber La Pryor   Jackie Parker is a 79 y.o. 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 Patient was brought to the ED due to atrial fibrillation with rapid ventricular response from a preop appointment for an D&C to treat abnormal uterine bleeding. EKG on arrival showed atrial fibrillation with RVR, with HR in the 180s. Patient was rate controlled with diltiazem. She underwent D&C during her admission. Per GYN recommendations, she was not started on anticoagulation for a CHADS2VASC score of 4. She denies significant alcohol use.   On follow up today, patient reports she has been doing well from an afib standpoint with no heart racing or palpitations. She has been diagnosed with severe OSA and is waiting for her CPAP to be delivered. She does report dysphagia with fluid but not with food which has made it difficult for her to take her medications.   Today, she denies symptoms of palpitations, chest pain, orthopnea, PND, dizziness, presyncope, syncope, or neurologic sequela. The patient is tolerating medications without difficulties and is otherwise without complaint today.    Atrial Fibrillation Risk Factors:  she does have symptoms or diagnosis of sleep apnea. she does not have a history of rheumatic fever. she does not have a history of alcohol use. The patient does not have a history of early familial atrial fibrillation or other arrhythmias.  she has a BMI of Body mass index is 46.27 kg/m.Marland Kitchen Filed Weights   01/20/20 1520  Weight: 114.8 kg    Family History    Problem Relation Age of Onset  . Hypertension Sister   . Glaucoma Sister   . Endometrial cancer Neg Hx   . Ovarian cancer Neg Hx      Atrial Fibrillation Management history:  Previous antiarrhythmic drugs: amiodarone Previous cardioversions: none Previous ablations: none CHADS2VASC score: 4 Anticoagulation history: none   Past Medical History:  Diagnosis Date  . AKI (acute kidney injury) ([place]) 12/2015  . Allergy    takes Singulair and Zyrtec daily  . Asthma    uses Adair daily  . Cataracts, bilateral   . Colitis, ulcerative ([place])   . DJD (degenerative joint disease)   . Glaucoma    uses eye drops daily  . History of blood transfusion    no abnormal reaction noted. 40+ yrs ago  . History of bronchitis    yrs ago  . History of gout    not on any meds  . Hypertension    takes Amlodipine daily  . Joint pain   . Joint swelling   . OSA (obstructive sleep apnea) 12/17/2019  . PMB (postmenopausal bleeding)   . Psoriasis   . Urinary frequency    takes Ditropan daily  . Vertigo    doesn't take any meds  . Vitamin D deficiency    Past Surgical History:  Procedure Laterality Date  . BACK SURGERY    . COLONOSCOPY    . DILATATION & CURETTAGE/HYSTEROSCOPY WITH MYOSURE N/A 08/05/2019   Procedure: DILATATION & CURETTAGE/HYSTEROSCOPY WITH MYOSURE;  Surgeon: Christophe Louis, MD;  Location: [facility];  Service: Gynecology;  Laterality: N/A;  Myosure rep will be here confirmed on 08/01/19 CS  . ILEOSTOMY    .  KNEE ARTHROPLASTY Left 12/14/2015   Procedure: COMPUTER ASSISTED LEFT TOTAL KNEE ARTHROPLASTY;  Surgeon: Jessy Oto, MD;  Location: [facility];  Service: Orthopedics;  Laterality: Left;  . TOTAL COLECTOMY      total colectomy in 1977  . TOTAL KNEE ARTHROPLASTY Left 12/14/2015  . TOTAL KNEE ARTHROPLASTY Right 06/27/2016  . TOTAL KNEE ARTHROPLASTY Right 06/27/2016   Procedure: RIGHT TOTAL KNEE ARTHROPLASTY;  Surgeon: Jessy Oto, MD;  Location: [facility];  Service: Orthopedics;   Laterality: Right;  . TUBAL LIGATION      Current Outpatient Medications  Medication Sig Dispense Refill  . albuterol (PROVENTIL) (2.5 MG/3ML) 0.083% nebulizer solution Take 3 mLs (2.5 mg total) by nebulization every 6 (six) hours as needed for wheezing or shortness of breath. 75 mL 6  . allopurinol (ZYLOPRIM) 100 MG tablet TAKE 1 TABLET(100 MG) BY MOUTH DAILY 30 tablet 1  . amiodarone (PACERONE) 200 MG tablet Take 1 tablet (200 mg total) by mouth daily. 30 tablet 3  . brimonidine (ALPHAGAN) 0.2 % ophthalmic solution Place 1 drop into the right eye 3 (three) times daily.    . brinzolamide (AZOPT) 1 % ophthalmic suspension Place 1 drop into the right eye 3 (three) times daily.     . colchicine 0.6 MG tablet Take 2 tabs (1.2 mg) at the onset of a gout attack, may repeat 1 tab (0.6 mg) 2 hours later if symptoms persist. 30 tablet 1  . diltiazem (CARDIZEM CD) 360 MG 24 hr capsule Take 1 capsule (360 mg total) by mouth daily. 30 capsule 11  . fluocinonide ointment (LIDEX) 3.53 % Apply 1 application topically 2 (two) times daily as needed for itching.    . Fluticasone-Salmeterol (ADVAIR DISKUS) 500-50 MCG/DOSE AEPB Inhale 1 puff into the lungs in the morning and at bedtime. 60 each 2  . furosemide (LASIX) 40 MG tablet Take 1 tablet (40 mg total) by mouth daily. 30 tablet 2  . LUMIGAN 0.01 % SOLN Place 1 drop into both eyes at bedtime.    . medroxyPROGESTERone (PROVERA) 10 MG tablet Take 20 mg by mouth daily.    . megestrol (MEGACE) 40 MG tablet Take 1 tablet (40 mg total) by mouth 2 (two) times daily. 60 tablet 11  . potassium chloride (KLOR-CON) 10 MEQ tablet Take 2 tablets (20 mEq total) by mouth daily. 180 tablet 2  . PROAIR HFA 108 (90 Base) MCG/ACT inhaler INHALE 2 PUFFS INTO THE LUNGS EVERY 6 HOURS AS NEEDED FOR WHEEZING OR SHORTNESS OF BREATH 8.5 g 2  . prochlorperazine (COMPAZINE) 10 MG tablet TAKE 1 TABLET(10 MG) BY MOUTH TWICE DAILY AS NEEDED FOR NAUSEA OR VOMITING 30 tablet 0  . potassium  chloride (KLOR-CON) 10 MEQ tablet Take 4 tablets (40 mEq total) by mouth 2 (two) times daily for 5 days. 40 tablet 0   Current Facility-Administered Medications  Medication Dose Route Frequency Provider Last Rate Last Admin  . indomethacin (INDOCIN) capsule 25 mg  25 mg Oral BID WC Jessy Oto, MD        Allergies  Allergen Reactions  . Megace Es [Megestrol Acetate] Shortness Of Breath  . Sulfonamide Derivatives Rash    Social History   Socioeconomic History  . Marital status: Single    Spouse name: Not on file  . Number of children: Not on file  . Years of education: Not on file  . Highest education level: Not on file  Occupational History  . Not on file  Tobacco Use  .  Smoking status: Never Smoker  . Smokeless tobacco: Never Used  Substance and Sexual Activity  . Alcohol use: No    Alcohol/week: 0.0 standard drinks  . Drug use: No  . Sexual activity: Not Currently    Birth control/protection: Post-menopausal  Other Topics Concern  . Not on file  Social History Narrative  . Not on file   Social Determinants of Health   Financial Resource Strain:   . Difficulty of Paying Living Expenses:   Food Insecurity:   . Worried About Charity fundraiser in the Last Year:   . Arboriculturist in the Last Year:   Transportation Needs:   . Film/video editor (Medical):   Marland Kitchen Lack of Transportation (Non-Medical):   Physical Activity:   . Days of Exercise per Week:   . Minutes of Exercise per Session:   Stress:   . Feeling of Stress :   Social Connections:   . Frequency of Communication with Friends and Family:   . Frequency of Social Gatherings with Friends and Family:   . Attends Religious Services:   . Active Member of Clubs or Organizations:   . Attends Archivist Meetings:   Marland Kitchen Marital Status:   Intimate Partner Violence:   . Fear of Current or Ex-Partner:   . Emotionally Abused:   Marland Kitchen Physically Abused:   . Sexually Abused:      ROS- All systems  are reviewed and negative except as per the HPI above.  Physical Exam: Vitals:   01/20/20 1520  BP: (!) 160/90  Pulse: 95  Weight: 114.8 kg  Height: 5' 2"  (1.575 m)    GEN- The patient is well appearing obese elderly female, alert and oriented x 3 today.   HEENT-head normocephalic, atraumatic, sclera clear, conjunctiva pink, hearing intact, trachea midline. Lungs- slight wheezing, normal work of breathing Heart- Regular rate and rhythm, no murmurs, rubs or gallops  GI- soft, NT, ND, + BS Extremities- no clubbing, cyanosis. 1+ bilateral edema MS- no significant deformity or atrophy Skin- no rash or lesion Psych- euthymic mood, full affect Neuro- strength and sensation are intact   Wt Readings from Last 3 Encounters:  01/20/20 114.8 kg  12/17/19 110.2 kg  12/02/19 110.3 kg    EKG today demonstrates SR HR 95, PR 96, QRS 88, QTc 437  Echo 08/02/19 demonstrated   1. Left ventricular ejection fraction, by visual estimation, is 55 to 60%. The left ventricle has normal function. There is mildly increased left ventricular hypertrophy.  2. The left ventricle has no regional wall motion abnormalities.  3. Global right ventricle has normal systolic function.The right ventricular size is normal. No increase in right ventricular wall thickness.  4. Left atrial size was normal.  5. Right atrial size was normal.  6. The mitral valve is normal in structure. Trace mitral valve regurgitation. No evidence of mitral stenosis.  7. The tricuspid valve is normal in structure. Tricuspid valve regurgitation is mild.  8. The aortic valve is normal in structure. Aortic valve regurgitation is not visualized. Mild aortic valve sclerosis without stenosis.  9. There is Mild calcification of the aortic valve. 10. There is Mild thickening of the aortic valve. 11. The pulmonic valve was grossly normal. Pulmonic valve regurgitation is not visualized. 12. Moderately elevated pulmonary artery systolic  pressure. 13. The inferior vena cava is dilated in size with <50% respiratory variability, suggesting right atrial pressure of 15 mmHg.  Epic records are reviewed at length today  Assessment and Plan:  1. Persistent atrial fibrillation/Atrial tachycardia Patient appears to be maintaining SR. Continue amiodarone 200 daily.  Continue diltiazem to 360 mg daily. Patient is not on anticoagulation 2/2 postmenopausal bleeding.  This patients CHA2DS2-VASc Score and unadjusted Ischemic Stroke Rate (% per year) is equal to 4.8 % stroke rate/year from a score of 4  Above score calculated as 1 point each if present [CHF, HTN, DM, Vascular=MI/PAD/Aortic Plaque, Age if 65-74, or Female] Above score calculated as 2 points each if present [Age > 75, or Stroke/TIA/TE]  2. Obesity Body mass index is 46.27 kg/m. Lifestyle modification was discussed and encouraged including regular physical activity and weight reduction.  3. HTN Stable, no changes today.  4. Chronic diastolic CHF Continue Lasix and KCl.   5. Dysphagia  Offered referral to GI. Patient reports she will call PCP today for evaluation.    Follow up with Richardson Dopp and Dr Rayann Heman as scheduled.     [facility] [address] [place], [place] [address] [phone] 01/20/2020 4:20 PM

## 2020-01-22 ENCOUNTER — Ambulatory Visit (HOSPITAL_COMMUNITY)
Admission: RE | Admit: 2020-01-22 | Discharge: 2020-01-22 | Disposition: A | Discharge status: Home / Self Care | Payer: Medicare Other | Source: Ambulatory Visit | Attending: Gynecologic Oncology | Admitting: Gynecologic Oncology

## 2020-01-22 ENCOUNTER — Inpatient Hospital Stay: Payer: Medicare Other | Attending: Gynecologic Oncology | Admitting: Gynecologic Oncology

## 2020-01-22 ENCOUNTER — Encounter: Payer: Self-pay | Admitting: Gynecologic Oncology

## 2020-01-22 ENCOUNTER — Encounter (HOSPITAL_COMMUNITY): Payer: Self-pay

## 2020-01-22 ENCOUNTER — Other Ambulatory Visit: Payer: Self-pay

## 2020-01-22 VITALS — BP 174/82 | HR 95 | Temp 98.3°F | Resp 19 | Ht 62.0 in | Wt 253.2 lb

## 2020-01-22 DIAGNOSIS — N950 Postmenopausal bleeding: Secondary | ICD-10-CM | POA: Insufficient documentation

## 2020-01-22 DIAGNOSIS — N858 Other specified noninflammatory disorders of uterus: Secondary | ICD-10-CM | POA: Insufficient documentation

## 2020-01-22 DIAGNOSIS — Z6841 Body Mass Index (BMI) 40.0 and over, adult: Secondary | ICD-10-CM | POA: Insufficient documentation

## 2020-01-22 DIAGNOSIS — N189 Chronic kidney disease, unspecified: Secondary | ICD-10-CM | POA: Insufficient documentation

## 2020-01-22 DIAGNOSIS — I129 Hypertensive chronic kidney disease with stage 1 through stage 4 chronic kidney disease, or unspecified chronic kidney disease: Secondary | ICD-10-CM | POA: Insufficient documentation

## 2020-01-22 DIAGNOSIS — C7800 Secondary malignant neoplasm of unspecified lung: Secondary | ICD-10-CM | POA: Diagnosis not present

## 2020-01-22 DIAGNOSIS — I4891 Unspecified atrial fibrillation: Secondary | ICD-10-CM | POA: Insufficient documentation

## 2020-01-22 DIAGNOSIS — Z79899 Other long term (current) drug therapy: Secondary | ICD-10-CM | POA: Diagnosis not present

## 2020-01-22 DIAGNOSIS — N259 Disorder resulting from impaired renal tubular function, unspecified: Secondary | ICD-10-CM

## 2020-01-22 DIAGNOSIS — I491 Atrial premature depolarization: Secondary | ICD-10-CM

## 2020-01-22 DIAGNOSIS — K8020 Calculus of gallbladder without cholecystitis without obstruction: Secondary | ICD-10-CM | POA: Diagnosis not present

## 2020-01-22 DIAGNOSIS — C787 Secondary malignant neoplasm of liver and intrahepatic bile duct: Secondary | ICD-10-CM | POA: Diagnosis not present

## 2020-01-22 MED ORDER — MEDROXYPROGESTERONE ACETATE 10 MG PO TABS: 20.0000 mg | ORAL_TABLET | Freq: Every day | ORAL | 1 refills | Status: AC

## 2020-01-22 NOTE — Patient Instructions (Signed)
Dr Denman George has represcribed you 6 months of provera to take.  She will reach out to your heart doctor to discuss you starting coumadin. If he desires this, his office will reach out to you with a prescription and instructions regarding testing of this medication.  Dr Denman George would like to see you in 6 months to monitor your symptoms. If you begin bleeding sooner, particularly if you begin bleeding after starting the coumadin, please call her office sooner at [phone].

## 2020-01-22 NOTE — Progress Notes (Signed)
Follow-up Note: Gyn-Onc  Consult was requested by Dr. Landry Mellow for the evaluation of Jackie Parker 79 y.o. female  CC:  Chief Complaint  Patient presents with  . Postmenopausal bleeding    Assessment/Plan:  Jackie Parker  is a 79 y.o.  year old with postmenopausal bleeding in the setting of atrial fibrillation and morbid obesity. Not on anticoagulation. Non-diagnostic workup with endometrial biopsy and hysteroscopy D&C. Postmenopausal bleeding is currently controlled well with provera (also a treatment of an occult cancer).  I again discussed options with Jackie Assad. I discussed that one option would be to proceed with a hysterectomy and BSO.  Given the size of her uterus, this may require a laparotomy or certainly a mini laparotomy for specimen delivery after robotic surgery.  No be high risk for intraoperative complication and postoperative complication given her very poor underlying medical status and comorbidities and prior complex abdominal surgeries.  Given the absence of a definitive cancer diagnosis at this time if concerns about embarking upon such procedure particularly while her symptoms of bleeding and any occult cancer being currently treated with Provera without exacerbation of her cardiopulmonary symptoms.  An alternative to this treatment with hysterectomy would be continuing Provera medical therapy.  I asked the patient what she would prefer to do and she feels strongly that she would prefer to continue taking the pills and avoid surgery.  She feels that the pills do not negatively impact her breathing and they control her bleeding.  Given that she currently has no bleeding I think it is reasonable for her to attempt starting Coumadin therapy for atrial fibrillation.  If she begins bleeding while on Coumadin and Provera, consideration could be made for either stopping the Coumadin and continuing without stroke prophylaxis, versus proceeding with surgery.  I think that risk  of life-threatening sequela equal with both options.  I will reach out to her cardiologist to discuss commencement of Coumadin.  I will see Janille in 6 months time or sooner should she begin bleeding after starting Coumadin.  A refill for Provera for 6 months was placed.  HPI: Jackie Parker is a 79 year old P5 who was seen in consultation at the request of Dr Landry Mellow for evaluation of postmenopausal bleeding in the setting of obesity and a complex prior surgical history.  She has a history of postmenopausal bleeding at age 38 which was treated with an IUD (progestin releasing). It was removed in December,  2019. The patient reported that her post-menopausal bleeding began in June/July, 2020.   She underwent TVUS on 04/17/19 which showed a uterus measuring 10.9 x 6.8 x 8.6 cm. Heterogeneous myometrium with scattered areas of shadowing, few tiny myometrial cysts, and minimally asymmetric anterior thickening highly suggestive of adenomyosis. Peripheral artery calcifications are noted. Posterior submucosal mass 2.2 x 2.2 x 2.8 cm at upper uterus consistent with leiomyoma. The endometrial thickness was 13 mm with a heterogeneous appearance. The right ovary measured 4.9 x 2.0 x 1.2 cm with normal morphology. The left ovary measured 4.1 x 1.3 x 1.6 cm with normal morphology  She underwent office biopsy on 07/12/19 which showed rare fragments of benign endometrium.  The patient continued to bleed and developed anemia to an Hb of 61m/dL. She was taken to the OR on 08/05/19 for an attempted hysteroscopy D&C. Dr CLandry Mellowplaced the hysteroscopy and performed myosure of the lining but could not complete sharp curette due to bleeding from the cervix. Pathology from this specimen revealed fibromuscular tissue devoid  of overlying endometrium. Unremarkable endocervical glandular and squamous mucosa.  She was prescribed 43m provera to take BID. After beginning the Provera, she stopped having vaginal bleeding however  developed severe SOB on exertion.  Dr CLandry Mellowdid not feel comfortable proceding with a diagnostic hysterectomy due to the complexity of the patient's case.   The patient's past medical history is significant for morbid obesity with a BMI of 43 kg/m2.  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  She has had a tubal ligation prior to her colectomy and back and neck surgeries.  Her gynecologic history is significant for 5 vaginal deliveries and no history of abnormal pap smears.    She has been seen by cardiology, most recently in January, 2021. She was noted to be in sinus rhythm, rate controlled, but not on anticoagulation due to her untreated postmenopausal bleeding.  Her last echocardiogram was on 08/02/19. It showed normal EF of 55-60%, moderately elevated PA systolic pressure.   After initial evaluation she was offered "diagnostic" hysterectomy. She was not enthusiastic about proceeding with surgery at that time.   Interval Hx:  The patient cancelled scheduled CT imaging. She had been prescribed megace by me but this made her breathing worse and therefore it was changed to Provera which she had been prescribed by Dr CLandry Mellow This stopped her bleeding symptoms and she had no negative impact on her SOB.  A new order for CT abd/pelvis without contrast (due to CKD) was placed.  CT was performed on 01/22/2020 and revealed a liver with a shrunken appearance and nodular contour consistent of cirrhosis.  Normal spleen.  Right lower quadrant ileostomy status post total colectomy.  No lymphadenopathy.  The uterus was enlarged measuring 14 x 7.8 x 9.2 cm with no discrete uterine mass on the noncontrasted CT.  There was a large amount of gas within the endometrial canal.  The ovaries were unremarkable.  There was no ascites.  She had been seen by CMalka Sofrom cardiology within the past month who felt she was stable and in sinus rhythm.    Current Meds:  Outpatient Encounter Medications as of 01/22/2020  Medication Sig  . albuterol (PROVENTIL) (2.5 MG/3ML) 0.083% nebulizer solution Take 3 mLs (2.5 mg total) by nebulization every 6 (six) hours as needed for wheezing or shortness of breath.  . allopurinol (ZYLOPRIM) 100 MG tablet TAKE 1 TABLET(100 MG) BY MOUTH DAILY  . amiodarone (PACERONE) 200 MG tablet Take 1 tablet (200 mg total) by mouth daily.  . brimonidine (ALPHAGAN) 0.2 % ophthalmic solution Place 1 drop into the right eye 3 (three) times daily.  . brinzolamide (AZOPT) 1 % ophthalmic suspension Place 1 drop into the right eye 3 (three) times daily.   . colchicine 0.6 MG tablet Take 2 tabs (1.2 mg) at the onset of a gout attack, may repeat 1 tab (0.6 mg) 2 hours later if symptoms persist.  . fluocinonide ointment (LIDEX) 01.66% Apply 1 application topically 2 (two) times daily as needed for itching.  . Fluticasone-Salmeterol (ADVAIR DISKUS) 500-50 MCG/DOSE AEPB Inhale 1 puff into the lungs in the morning and at bedtime.  . furosemide (LASIX) 40 MG tablet Take 1 tablet (40 mg total) by mouth daily.  .Marland KitchenLUMIGAN 0.01 % SOLN Place 1 drop into both eyes at bedtime.  . medroxyPROGESTERone (PROVERA) 10 MG tablet Take 2 tablets (20 mg total) by mouth daily.  .Marland KitchenPROAIR HFA 108 (  90 Base) MCG/ACT inhaler INHALE 2 PUFFS INTO THE LUNGS EVERY 6 HOURS AS NEEDED FOR WHEEZING OR SHORTNESS OF BREATH  . prochlorperazine (COMPAZINE) 10 MG tablet TAKE 1 TABLET(10 MG) BY MOUTH TWICE DAILY AS NEEDED FOR NAUSEA OR VOMITING  . [DISCONTINUED] medroxyPROGESTERone (PROVERA) 10 MG tablet Take 20 mg by mouth daily.  Marland Kitchen diltiazem (CARDIZEM CD) 360 MG 24 hr capsule Take 1 capsule (360 mg total) by mouth daily. (Patient not taking: Reported on 01/22/2020)  . potassium chloride (KLOR-CON) 10 MEQ tablet Take 4 tablets (40 mEq total) by mouth 2 (two) times daily for 5 days.  . [DISCONTINUED] cetirizine (ZYRTEC) 10 MG tablet TAKE 1 TABLET(10 MG) BY MOUTH DAILY  (Patient not taking: Reported on 01/20/2020)  . [DISCONTINUED] megestrol (MEGACE) 40 MG tablet Take 1 tablet (40 mg total) by mouth 2 (two) times daily.  . [DISCONTINUED] potassium chloride (KLOR-CON) 10 MEQ tablet Take 2 tablets (20 mEq total) by mouth daily.  . [DISCONTINUED] predniSONE (DELTASONE) 10 MG tablet Take 1 tablet (10 mg total) by mouth daily with breakfast. 20 mg daily for 5 days then 10 mg daily for 5 days (Patient not taking: Reported on 01/20/2020)   Facility-Administered Encounter Medications as of 01/22/2020  Medication  . indomethacin (INDOCIN) capsule 25 mg    Allergy:  Allergies  Allergen Reactions  . Megace Es [Megestrol Acetate] Shortness Of Breath  . Sulfonamide Derivatives Rash    Social Hx:   Social History   Socioeconomic History  . Marital status: Single    Spouse name: Not on file  . Number of children: Not on file  . Years of education: Not on file  . Highest education level: Not on file  Occupational History  . Not on file  Tobacco Use  . Smoking status: Never Smoker  . Smokeless tobacco: Never Used  Substance and Sexual Activity  . Alcohol use: No    Alcohol/week: 0.0 standard drinks  . Drug use: No  . Sexual activity: Not Currently    Birth control/protection: Post-menopausal  Other Topics Concern  . Not on file  Social History Narrative  . Not on file   Social Determinants of Health   Financial Resource Strain:   . Difficulty of Paying Living Expenses:   Food Insecurity:   . Worried About Charity fundraiser in the Last Year:   . Arboriculturist in the Last Year:   Transportation Needs:   . Film/video editor (Medical):   Marland Kitchen Lack of Transportation (Non-Medical):   Physical Activity:   . Days of Exercise per Week:   . Minutes of Exercise per Session:   Stress:   . Feeling of Stress :   Social Connections:   . Frequency of Communication with Friends and Family:   . Frequency of Social Gatherings with Friends and Family:   .  Attends Religious Services:   . Active Member of Clubs or Organizations:   . Attends Archivist Meetings:   Marland Kitchen Marital Status:   Intimate Partner Violence:   . Fear of Current or Ex-Partner:   . Emotionally Abused:   Marland Kitchen Physically Abused:   . Sexually Abused:     Past Surgical Hx:  Past Surgical History:  Procedure Laterality Date  . BACK SURGERY    . COLONOSCOPY    . DILATATION & CURETTAGE/HYSTEROSCOPY WITH MYOSURE N/A 08/05/2019   Procedure: DILATATION & CURETTAGE/HYSTEROSCOPY WITH MYOSURE;  Surgeon: Christophe Louis, MD;  Location: [facility];  Service: Gynecology;  Laterality: N/A;  Myosure rep will be here confirmed on 08/01/19 CS  . ILEOSTOMY    . KNEE ARTHROPLASTY Left 12/14/2015   Procedure: COMPUTER ASSISTED LEFT TOTAL KNEE ARTHROPLASTY;  Surgeon: Jessy Oto, MD;  Location: [facility];  Service: Orthopedics;  Laterality: Left;  . TOTAL COLECTOMY      total colectomy in 1977  . TOTAL KNEE ARTHROPLASTY Left 12/14/2015  . TOTAL KNEE ARTHROPLASTY Right 06/27/2016  . TOTAL KNEE ARTHROPLASTY Right 06/27/2016   Procedure: RIGHT TOTAL KNEE ARTHROPLASTY;  Surgeon: Jessy Oto, MD;  Location: [facility];  Service: Orthopedics;  Laterality: Right;  . TUBAL LIGATION      Past Medical Hx:  Past Medical History:  Diagnosis Date  . AKI (acute kidney injury) ([place]) 12/2015  . Allergy    takes Singulair and Zyrtec daily  . Asthma    uses Adair daily  . Cataracts, bilateral   . Colitis, ulcerative ([place])   . DJD (degenerative joint disease)   . Glaucoma    uses eye drops daily  . History of blood transfusion    no abnormal reaction noted. 40+ yrs ago  . History of bronchitis    yrs ago  . History of gout    not on any meds  . Hypertension    takes Amlodipine daily  . Joint pain   . Joint swelling   . OSA (obstructive sleep apnea) 12/17/2019  . PMB (postmenopausal bleeding)   . Psoriasis   . Urinary frequency    takes Ditropan daily  . Vertigo    doesn't take any meds  . Vitamin D  deficiency     Past Gynecological History:  See HPI No LMP recorded. Patient is postmenopausal.  Family Hx:  Family History  Problem Relation Age of Onset  . Hypertension Sister   . Glaucoma Sister   . Endometrial cancer Neg Hx   . Ovarian cancer Neg Hx     Review of Systems:  Constitutional  Feels fatigued, Increased work of breathing  ENT Normal appearing ears and nares bilaterally Skin/Breast  Psoriatic rash covering distal lower extremities.  Cardiovascular  + SOB Pulmonary  + wheeze Gastro Intestinal  No nausea, vomitting, or diarrhoea. No bright red blood per rectum, no abdominal pain, change in bowel movement, or constipation. Has ileostomy Genito Urinary  No frequency, urgency, dysuria, no bleeding. Musculo Skeletal  + back pain Neurologic  No weakness, numbness, change in gait,  Psychology  No depression, anxiety, insomnia.   Vitals:  Blood pressure (!) 174/82, pulse 95, temperature 98.3 F (36.8 C), temperature source Temporal, resp. rate 19, height 5' 2"  (1.575 m), weight 253 lb 3.2 oz (114.9 kg), SpO2 96 %. BMI 43kg/m2.  Physical Exam: Appears mildly uncomfortable with breathing at rest with audible wheeze Neck  Supple NROM, without any enlargements.  Lymph Node Survey No cervical supraclavicular or inguinal adenopathy Cardiovascular  Regular rhythm with rapid rate.  Lungs  + crackles and wheezes.  Skin  + rash on bilateral lower extremities (psoriatic) Psychiatry  Alert and oriented to person, place, and time  Abdomen  Normoactive bowel sounds, abdomen soft, non-tender and obese without evidence of hernia. Ileostomy functioning (right mid abdomen).  Back No CVA tenderness Genito Urinary  Vulva/vagina: Normal external female genitalia.   No lesions. No discharge or bleeding.  Bladder/urethra:  No lesions or masses, well supported bladder  Vagina: normal, no blood  Cervix: Normal appearing, no lesions.  Uterus: Bulky, firm, enlarged,  minimally mobile, no  parametrial involvement or nodularity.  Adnexa: unable to discretely palpate masses but exam limited due to body habitus.  Rectal  Good tone, no masses no cul de sac nodularity.  Extremities  Shiny skin on lower extremities consistent with vascular disease.    Thereasa Solo, MD  01/22/2020, 3:47 PM

## 2020-01-30 ENCOUNTER — Ambulatory Visit: Payer: Medicare Other | Admitting: Pulmonary Disease

## 2020-01-30 DIAGNOSIS — J4541 Moderate persistent asthma with (acute) exacerbation: Secondary | ICD-10-CM | POA: Diagnosis not present

## 2020-02-03 ENCOUNTER — Ambulatory Visit (INDEPENDENT_AMBULATORY_CARE_PROVIDER_SITE_OTHER): Payer: Medicare Other | Admitting: Physician Assistant

## 2020-02-03 ENCOUNTER — Other Ambulatory Visit: Payer: Self-pay

## 2020-02-03 ENCOUNTER — Inpatient Hospital Stay (HOSPITAL_COMMUNITY)
Admission: AD | Admit: 2020-02-03 | Discharge: 2020-02-14 | DRG: 286 | Disposition: A | Discharge status: Home / Self Care | Payer: Medicare Other | Source: Ambulatory Visit | Attending: Interventional Cardiology | Admitting: Interventional Cardiology

## 2020-02-03 ENCOUNTER — Encounter: Payer: Self-pay | Admitting: Physician Assistant

## 2020-02-03 VITALS — BP 102/50 | HR 87 | Ht 62.0 in | Wt 256.0 lb

## 2020-02-03 DIAGNOSIS — N950 Postmenopausal bleeding: Secondary | ICD-10-CM | POA: Diagnosis present

## 2020-02-03 DIAGNOSIS — J45909 Unspecified asthma, uncomplicated: Secondary | ICD-10-CM | POA: Diagnosis not present

## 2020-02-03 DIAGNOSIS — M109 Gout, unspecified: Secondary | ICD-10-CM | POA: Diagnosis not present

## 2020-02-03 DIAGNOSIS — E669 Obesity, unspecified: Secondary | ICD-10-CM | POA: Diagnosis present

## 2020-02-03 DIAGNOSIS — I5033 Acute on chronic diastolic (congestive) heart failure: Secondary | ICD-10-CM | POA: Diagnosis present

## 2020-02-03 DIAGNOSIS — L409 Psoriasis, unspecified: Secondary | ICD-10-CM | POA: Diagnosis present

## 2020-02-03 DIAGNOSIS — R8281 Pyuria: Secondary | ICD-10-CM | POA: Diagnosis not present

## 2020-02-03 DIAGNOSIS — N183 Chronic kidney disease, stage 3 unspecified: Secondary | ICD-10-CM

## 2020-02-03 DIAGNOSIS — Z96653 Presence of artificial knee joint, bilateral: Secondary | ICD-10-CM | POA: Diagnosis present

## 2020-02-03 DIAGNOSIS — T502X5A Adverse effect of carbonic-anhydrase inhibitors, benzothiadiazides and other diuretics, initial encounter: Secondary | ICD-10-CM | POA: Diagnosis not present

## 2020-02-03 DIAGNOSIS — Z6841 Body Mass Index (BMI) 40.0 and over, adult: Secondary | ICD-10-CM

## 2020-02-03 DIAGNOSIS — I5031 Acute diastolic (congestive) heart failure: Secondary | ICD-10-CM | POA: Diagnosis not present

## 2020-02-03 DIAGNOSIS — Z79899 Other long term (current) drug therapy: Secondary | ICD-10-CM

## 2020-02-03 DIAGNOSIS — I130 Hypertensive heart and chronic kidney disease with heart failure and stage 1 through stage 4 chronic kidney disease, or unspecified chronic kidney disease: Principal | ICD-10-CM | POA: Diagnosis present

## 2020-02-03 DIAGNOSIS — I10 Essential (primary) hypertension: Secondary | ICD-10-CM

## 2020-02-03 DIAGNOSIS — E559 Vitamin D deficiency, unspecified: Secondary | ICD-10-CM | POA: Diagnosis not present

## 2020-02-03 DIAGNOSIS — J9811 Atelectasis: Secondary | ICD-10-CM | POA: Diagnosis not present

## 2020-02-03 DIAGNOSIS — Z20822 Contact with and (suspected) exposure to covid-19: Secondary | ICD-10-CM | POA: Diagnosis not present

## 2020-02-03 DIAGNOSIS — I4819 Other persistent atrial fibrillation: Secondary | ICD-10-CM | POA: Diagnosis present

## 2020-02-03 DIAGNOSIS — Z23 Encounter for immunization: Secondary | ICD-10-CM | POA: Diagnosis not present

## 2020-02-03 DIAGNOSIS — Z83511 Family history of glaucoma: Secondary | ICD-10-CM | POA: Diagnosis not present

## 2020-02-03 DIAGNOSIS — I272 Pulmonary hypertension, unspecified: Secondary | ICD-10-CM | POA: Diagnosis not present

## 2020-02-03 DIAGNOSIS — Z8249 Family history of ischemic heart disease and other diseases of the circulatory system: Secondary | ICD-10-CM

## 2020-02-03 DIAGNOSIS — N184 Chronic kidney disease, stage 4 (severe): Secondary | ICD-10-CM | POA: Diagnosis present

## 2020-02-03 DIAGNOSIS — I5030 Unspecified diastolic (congestive) heart failure: Secondary | ICD-10-CM | POA: Diagnosis not present

## 2020-02-03 DIAGNOSIS — G4733 Obstructive sleep apnea (adult) (pediatric): Secondary | ICD-10-CM | POA: Diagnosis present

## 2020-02-03 DIAGNOSIS — I509 Heart failure, unspecified: Secondary | ICD-10-CM

## 2020-02-03 DIAGNOSIS — I480 Paroxysmal atrial fibrillation: Secondary | ICD-10-CM | POA: Diagnosis not present

## 2020-02-03 DIAGNOSIS — E876 Hypokalemia: Secondary | ICD-10-CM | POA: Diagnosis not present

## 2020-02-03 DIAGNOSIS — N938 Other specified abnormal uterine and vaginal bleeding: Secondary | ICD-10-CM

## 2020-02-03 DIAGNOSIS — Z9109 Other allergy status, other than to drugs and biological substances: Secondary | ICD-10-CM

## 2020-02-03 DIAGNOSIS — I4891 Unspecified atrial fibrillation: Secondary | ICD-10-CM | POA: Diagnosis not present

## 2020-02-03 DIAGNOSIS — N179 Acute kidney failure, unspecified: Secondary | ICD-10-CM | POA: Diagnosis present

## 2020-02-03 DIAGNOSIS — Z882 Allergy status to sulfonamides status: Secondary | ICD-10-CM

## 2020-02-03 DIAGNOSIS — N1832 Chronic kidney disease, stage 3b: Secondary | ICD-10-CM | POA: Diagnosis not present

## 2020-02-03 HISTORY — DX: Heart failure, unspecified: I50.9

## 2020-02-03 LAB — COMPREHENSIVE METABOLIC PANEL
ALT: 16 U/L (ref 0–44)
AST: 42 U/L — ABNORMAL HIGH (ref 15–41)
Albumin: 2.8 g/dL — ABNORMAL LOW (ref 3.5–5.0)
Alkaline Phosphatase: 47 U/L (ref 38–126)
Anion gap: 11 (ref 5–15)
BUN: 19 mg/dL (ref 8–23)
CO2: 22 mmol/L (ref 22–32)
Calcium: 8.6 mg/dL — ABNORMAL LOW (ref 8.9–10.3)
Chloride: 108 mmol/L (ref 98–111)
Creatinine, Ser: 3.01 mg/dL — ABNORMAL HIGH (ref 0.44–1.00)
GFR calc Af Amer: 16 mL/min — ABNORMAL LOW (ref >60)
GFR calc non Af Amer: 14 mL/min — ABNORMAL LOW (ref >60)
Glucose, Bld: 100 mg/dL — ABNORMAL HIGH (ref 70–99)
Potassium: 4.8 mmol/L (ref 3.5–5.1)
Sodium: 141 mmol/L (ref 135–145)
Total Bilirubin: 1.9 mg/dL — ABNORMAL HIGH (ref 0.3–1.2)
Total Protein: 8 g/dL (ref 6.5–8.1)

## 2020-02-03 LAB — CBC WITH DIFFERENTIAL/PLATELET
Abs Immature Granulocytes: 0.02 10*3/uL (ref 0.00–0.07)
Basophils Absolute: 0.1 10*3/uL (ref 0.0–0.1)
Basophils Relative: 1 %
Eosinophils Absolute: 0.1 10*3/uL (ref 0.0–0.5)
Eosinophils Relative: 1 %
HCT: 39.2 % (ref 36.0–46.0)
Hemoglobin: 12.5 g/dL (ref 12.0–15.0)
Immature Granulocytes: 0 %
Lymphocytes Relative: 32 %
Lymphs Abs: 2.4 10*3/uL (ref 0.7–4.0)
MCH: 30 pg (ref 26.0–34.0)
MCHC: 31.9 g/dL (ref 30.0–36.0)
MCV: 94.2 fL (ref 80.0–100.0)
Monocytes Absolute: 1.2 10*3/uL — ABNORMAL HIGH (ref 0.1–1.0)
Monocytes Relative: 16 %
Neutro Abs: 3.7 10*3/uL (ref 1.7–7.7)
Neutrophils Relative %: 50 %
Platelets: 150 10*3/uL (ref 150–400)
RBC: 4.16 MIL/uL (ref 3.87–5.11)
RDW: 16.9 % — ABNORMAL HIGH (ref 11.5–15.5)
WBC: 7.4 10*3/uL (ref 4.0–10.5)
nRBC: 0 % (ref 0.0–0.2)

## 2020-02-03 LAB — SARS CORONAVIRUS 2 (TAT 6-24 HRS): SARS Coronavirus 2: NEGATIVE

## 2020-02-03 LAB — BRAIN NATRIURETIC PEPTIDE: B Natriuretic Peptide: 222.9 pg/mL — ABNORMAL HIGH (ref 0.0–100.0)

## 2020-02-03 MED ORDER — SODIUM CHLORIDE 0.9 % IV SOLN: 250.0000 mL | INTRAVENOUS | Status: DC | PRN

## 2020-02-03 MED ORDER — BRINZOLAMIDE 1 % OP SUSP
1.0000 [drp] | Freq: Three times a day (TID) | OPHTHALMIC | Status: DC
  Administered 2020-02-04 – 2020-02-14 (×31): 1 [drp] via OPHTHALMIC
  Filled 2020-02-03: qty 10

## 2020-02-03 MED ORDER — DILTIAZEM HCL ER COATED BEADS 180 MG PO CP24
360.0000 mg | ORAL_CAPSULE | Freq: Every day | ORAL | Status: DC
  Administered 2020-02-04 – 2020-02-14 (×11): 360 mg via ORAL
  Filled 2020-02-03 (×11): qty 2

## 2020-02-03 MED ORDER — ACETAMINOPHEN 325 MG PO TABS
650.0000 mg | ORAL_TABLET | ORAL | Status: DC | PRN
  Administered 2020-02-10: 650 mg via ORAL
  Filled 2020-02-03: qty 2

## 2020-02-03 MED ORDER — MEDROXYPROGESTERONE ACETATE 10 MG PO TABS
20.0000 mg | ORAL_TABLET | Freq: Every day | ORAL | Status: DC
  Administered 2020-02-04 – 2020-02-14 (×11): 20 mg via ORAL
  Filled 2020-02-03 (×12): qty 2

## 2020-02-03 MED ORDER — FLUOCINONIDE 0.05 % EX OINT
1.0000 "application " | TOPICAL_OINTMENT | Freq: Two times a day (BID) | CUTANEOUS | Status: DC | PRN
  Administered 2020-02-03 – 2020-02-05 (×3): 1 via TOPICAL
  Filled 2020-02-03: qty 15

## 2020-02-03 MED ORDER — SODIUM CHLORIDE 0.9% FLUSH: 3.0000 mL | INTRAVENOUS | Status: DC | PRN

## 2020-02-03 MED ORDER — POTASSIUM CHLORIDE CRYS ER 10 MEQ PO TBCR
40.0000 meq | EXTENDED_RELEASE_TABLET | Freq: Three times a day (TID) | ORAL | Status: DC
  Administered 2020-02-03 – 2020-02-05 (×7): 40 meq via ORAL
  Filled 2020-02-03: qty 4
  Filled 2020-02-03: qty 2
  Filled 2020-02-03 (×5): qty 4

## 2020-02-03 MED ORDER — MOMETASONE FURO-FORMOTEROL FUM 200-5 MCG/ACT IN AERO
2.0000 | INHALATION_SPRAY | Freq: Two times a day (BID) | RESPIRATORY_TRACT | Status: DC
  Administered 2020-02-04 – 2020-02-14 (×21): 2 via RESPIRATORY_TRACT
  Filled 2020-02-03 (×2): qty 8.8

## 2020-02-03 MED ORDER — ALBUTEROL SULFATE (2.5 MG/3ML) 0.083% IN NEBU: 2.5000 mg | INHALATION_SOLUTION | Freq: Four times a day (QID) | RESPIRATORY_TRACT | Status: DC | PRN

## 2020-02-03 MED ORDER — ALLOPURINOL 100 MG PO TABS
100.0000 mg | ORAL_TABLET | ORAL | Status: DC | PRN
  Filled 2020-02-03: qty 1

## 2020-02-03 MED ORDER — SODIUM CHLORIDE 0.9% FLUSH
3.0000 mL | Freq: Two times a day (BID) | INTRAVENOUS | Status: DC
  Administered 2020-02-03 – 2020-02-14 (×9): 3 mL via INTRAVENOUS

## 2020-02-03 MED ORDER — FUROSEMIDE 10 MG/ML IJ SOLN
60.0000 mg | Freq: Two times a day (BID) | INTRAMUSCULAR | Status: DC
  Administered 2020-02-03 – 2020-02-07 (×9): 60 mg via INTRAVENOUS
  Filled 2020-02-03 (×9): qty 6

## 2020-02-03 MED ORDER — ALBUTEROL SULFATE HFA 108 (90 BASE) MCG/ACT IN AERS: 1.0000 | INHALATION_SPRAY | Freq: Four times a day (QID) | RESPIRATORY_TRACT | Status: DC | PRN

## 2020-02-03 MED ORDER — BRIMONIDINE TARTRATE 0.2 % OP SOLN
1.0000 [drp] | Freq: Three times a day (TID) | OPHTHALMIC | Status: DC
  Administered 2020-02-04 – 2020-02-14 (×31): 1 [drp] via OPHTHALMIC
  Filled 2020-02-03: qty 5

## 2020-02-03 MED ORDER — ENOXAPARIN SODIUM 40 MG/0.4ML ~~LOC~~ SOLN
40.0000 mg | SUBCUTANEOUS | Status: DC
  Administered 2020-02-03 – 2020-02-08 (×6): 40 mg via SUBCUTANEOUS
  Filled 2020-02-03 (×6): qty 0.4

## 2020-02-03 MED ORDER — LATANOPROST 0.005 % OP SOLN
1.0000 [drp] | Freq: Every day | OPHTHALMIC | Status: DC
  Administered 2020-02-04 – 2020-02-13 (×10): 1 [drp] via OPHTHALMIC
  Filled 2020-02-03: qty 2.5

## 2020-02-03 MED ORDER — AMIODARONE HCL 200 MG PO TABS
200.0000 mg | ORAL_TABLET | Freq: Every day | ORAL | Status: DC
  Administered 2020-02-04 – 2020-02-14 (×11): 200 mg via ORAL
  Filled 2020-02-03 (×10): qty 1

## 2020-02-03 NOTE — H&P (Addendum)
The patient has been seen in conjunction with Jackie Dopp, PA-C. All aspects of care have been considered and discussed. The patient has been personally interviewed, examined, and all clinical data has been reviewed.  This unfortunate lady has slowly gained greater than 50 pounds over the past 6 months.  She has dyspnea on exertion and orthopnea.  She has been sleeping propped up lying on either side.  She denies chest discomfort.  The diuretic component of her current medical regimen is furosemide 40 mg/day. Today she has significant neck vein distention while sitting at 90 degrees in a wheelchair.  Both lower extremities are markedly edematous and firm.  Has abdominal distention and tightness. Presumed acute on chronic diastolic heart failure with anasarca.  Considerations include restrictive myocardial disease, constrictive pericarditis, and are gradual volume overload related to impact of atrial fibrillation. Plan direct admit to the hospital to begin IV diuresis.  Consider advanced heart failure service consultation.    Cardiology Admission History and Physical:   Date:  02/03/2020   ID:  Jackie Parker, DOB 1940-10-17, MRN [id number]  PCP:  Jackie Rakes, MD  Cardiologist:  Jackie Grooms, MD   Electrophysiologist:  Jackie Grayer, MD   Referring MD: Jackie Rakes, MD   Chief Complaint:  Follow-up (CHF)    Patient Profile:    Jackie Parker is a 79 y.o. female with:  Persistent atrial fibrillation CHA2DS2-VASc=4 (female, HTN, age x 2) >> no anticoagulation due to uterine bleeding   Amiodarone Rx Hypertension Ulcerative colitis S/p colectomy 1977 Osteoarthritis S/p bilateral TKR Chronic hypokalemia Psoriasis  Asthma  Postmenopausal uterine bleeding  Prior CV studies: Echocardiogram 08/02/2019 EF 55-60, mild LVH, no R WMA, normal RV SF, trace MR, mild TR, mild aortic valve sclerosis (no AS), RVSP 46.4 (moderately elevated)   History of Present Illness:     Jackie Parker has been followed by the [facility] and Dr. Rayann Parker since Dec 2020.  She was initially noted to have AFib with RVR when she presented for a D&C.  She could not start anticoagulation due to ongoing uterine bleeding.  She is followed by Gyn.  She has ultimately been placed on Amiodarone for rate control.  She has subsequently converted to normal sinus rhythm.  She has noted shortness of breath and leg swelling.  She is treated with Lasix.  She was last seen by Dr. Rayann Parker in 11/2019 and he requested follow up with general cardiology.    She is here today with her daughter.  She arrives in a wheelchair.  She notes significant dyspnea with minimal activity and at rest.  She has not had chest pain.  She does not orthopnea.  Her leg edema goes up to her knees.  She also feels her abdomen is swollen.     Past Medical History:  Diagnosis Date   AKI (acute kidney injury) ([place]) 12/2015   Allergy    takes Singulair and Zyrtec daily   Asthma    uses Adair daily   Cataracts, bilateral    Colitis, ulcerative ([place])    DJD (degenerative joint disease)    Glaucoma    uses eye drops daily   History of blood transfusion    no abnormal reaction noted. 40+ yrs ago   History of bronchitis    yrs ago   History of gout    not on any meds   Hypertension    takes Amlodipine daily   Joint pain    Joint swelling  OSA (obstructive sleep apnea) 12/17/2019   PMB (postmenopausal bleeding)    Psoriasis    Urinary frequency    takes Ditropan daily   Vertigo    doesn't take any meds   Vitamin D deficiency     Current Medications: Current Meds  Medication Sig   albuterol (PROAIR HFA) 108 (90 Base) MCG/ACT inhaler Inhale 1 puff into the lungs every 6 (six) hours as needed for wheezing or shortness of breath.   albuterol (PROVENTIL) (2.5 MG/3ML) 0.083% nebulizer solution Take by nebulization. tAKE 2 ML NEBULIZATION TWICE DAILY AS NEEDED FOR WHEEZING OR SHORTNESS OF BREATH   allopurinol (ZYLOPRIM) 100 MG  tablet TAKE 1 TABLET(100 MG) BY MOUTH DAILY   amiodarone (PACERONE) 200 MG tablet Take 1 tablet (200 mg total) by mouth daily.   brimonidine (ALPHAGAN) 0.2 % ophthalmic solution Place 1 drop into the right eye 3 (three) times daily.   brinzolamide (AZOPT) 1 % ophthalmic suspension Place 1 drop into the right eye 3 (three) times daily.    colchicine 0.6 MG tablet Take 2 tabs (1.2 mg) at the onset of a gout attack, may repeat 1 tab (0.6 mg) 2 hours later if symptoms persist.   diltiazem (CARDIZEM CD) 360 MG 24 hr capsule Take 1 capsule (360 mg total) by mouth daily.   fluocinonide ointment (LIDEX) 4.91 % Apply 1 application topically 2 (two) times daily as needed for itching.   Fluticasone-Salmeterol (ADVAIR DISKUS) 500-50 MCG/DOSE AEPB Inhale 1 puff into the lungs in the morning and at bedtime.   furosemide (LASIX) 40 MG tablet Take 1 tablet (40 mg total) by mouth daily.   LUMIGAN 0.01 % SOLN Place 1 drop into both eyes at bedtime.   medroxyPROGESTERone (PROVERA) 10 MG tablet Take 2 tablets (20 mg total) by mouth daily.   potassium chloride (KLOR-CON) 10 MEQ tablet Take 4 tablets (40 mEq total) by mouth 2 (two) times daily for 5 days.   prochlorperazine (COMPAZINE) 10 MG tablet TAKE 1 TABLET(10 MG) BY MOUTH TWICE DAILY AS NEEDED FOR NAUSEA OR VOMITING   Current Facility-Administered Medications for the 02/03/20 encounter (Office Visit) with Jackie Parker T, PA-C  Medication   indomethacin (INDOCIN) capsule 25 mg     Allergies:   Megace es [megestrol acetate] and Sulfonamide derivatives   Social History   Tobacco Use   Smoking status: Never Smoker   Smokeless tobacco: Never Used  Substance Use Topics   Alcohol use: No    Alcohol/week: 0.0 standard drinks   Drug use: No     Family Hx: The patient's family history includes Glaucoma in her sister; Hypertension in her sister. There is no history of Endometrial cancer or Ovarian cancer.  Review of Systems  Constitution: Negative for  fever.  Respiratory: Positive for wheezing.   Gastrointestinal: Positive for bloating. Negative for hematochezia and melena.  Genitourinary: Negative for hematuria.  All other systems reviewed and are negative.    EKGs/Labs/Other Test Reviewed:    EKG:  EKG is not  ordered today.  The ekg ordered today demonstrates n/a  Recent Labs: 08/01/2019: B Natriuretic Peptide 209.8 08/04/2019: Magnesium 1.9 11/13/2019: Hemoglobin 12.4; Platelets 150 12/02/2019: ALT 13; NT-Pro BNP 779; TSH 1.610 01/01/2020: BUN 13; Creatinine, Ser 2.04; Potassium 3.6; Sodium 146   Recent Lipid Panel Lab Results  Component Value Date/Time   CHOL 144 04/26/2019 09:58 AM   TRIG 101 04/26/2019 09:58 AM   HDL 61 04/26/2019 09:58 AM   CHOLHDL 2.4 04/26/2019 09:58 AM  CHOLHDL 2.6 11/09/2016 10:54 AM   LDLCALC 63 04/26/2019 09:58 AM    Physical Exam:    VS:  BP (!) 102/50   Pulse 87   Ht 5' 2"  (1.575 m)   Wt 256 lb (116.1 kg)   SpO2 93%   BMI 46.82 kg/m     Wt Readings from Last 3 Encounters:  02/03/20 256 lb (116.1 kg)  01/22/20 253 lb 3.2 oz (114.9 kg)  01/20/20 253 lb (114.8 kg)     Constitutional:      Appearance: Healthy appearance. Not in distress.  Neck:     Vascular: JVD normal.     Lymphadenopathy: No cervical adenopathy.     Comments: JVP to angle of jaw at 90 degrees Pulmonary:     Effort: Pulmonary effort is normal.     Breath sounds: Diffuse Wheezing (diffuse) present. Bibasilar Rales (faint bilateral) present.  Cardiovascular:     Normal rate. Regular rhythm. Normal S1. Normal S2.     Murmurs: There is no murmur.  Edema:    Pretibial: bilateral 4+ pitting edema of the pretibial area. Abdominal:     General: There is distension.     Comments: edematous  Skin:    General: Skin is warm and dry.  Neurological:     Mental Status: Alert and oriented to person, place and time.     Cranial Nerves: Cranial nerves are intact.  Psychiatric:        Mood and Affect: Affect normal.        ASSESSMENT & PLAN:    1. Acute on chronic diastolic CHF (congestive heart failure) (HCC) EF 55-60 by echocardiogram November 2020.  RVSP was 46.4 at that time.  The patient notes her weight was around 188 pounds prior to going to the hospital in November 2020.  She weighs 256 pounds today.  She is massively volume overloaded on exam today.  Her neck veins are visible up to the angle of her jaw while seated at 90 degrees.  She has brawny edema up to her knees and noted abdominal swelling with palpation.  I have recommended admission to the hospital for IV diuresis.  I reviewed this with Dr. Tamala Julian (attending MD) who also saw the patient and agrees.  We will arrange for bed at [facility] and start IV furosemide 60 mg twice daily.  She has a history of hypokalemia.  I will adjust her potassium supplementation to 40 mEq 3 times a day.  We will asked the advanced heart failure team to also consult on the patient as she likely needs more extensive evaluation for her heart failure.  She seems to have more right-sided symptoms than left.  Question if she should have work-up for restrictive cardiomyopathy or amyloid.  2. Persistent atrial fibrillation (HCC) Maintaining sinus rhythm.  She has not been placed anticoagulation due to significant bleeding risk.  She remains on amiodarone.  3. Essential hypertension Blood pressure well controlled.  Continue current dose of diltiazem.  4. Dysfunctional uterine bleeding The patient notes that her bleeding has resolved.  She notes that her gynecologist recently noted that she can likely start anticoagulation soon.    Severity of Illness: The appropriate patient status for this patient is INPATIENT. Inpatient status is judged to be reasonable and necessary in order to provide the required intensity of service to ensure the patient's safety. The patient's presenting symptoms, physical exam findings, and initial radiographic and laboratory data in the context  of their chronic comorbidities  is felt to place them at high risk for further clinical deterioration. Furthermore, it is not anticipated that the patient will be medically stable for discharge from the hospital within 2 midnights of admission. The following factors support the patient status of inpatient.   " The patient's presenting symptoms include dyspnea, swelling. " The worrisome physical exam findings include rales, edema, elevated JVP. " The initial radiographic and laboratory data are worrisome because of N/A. " The chronic co-morbidities include atrial fibrillation, dysfunctional uterine bleeding, hypertension.   * I certify that at the point of admission it is my clinical judgment that the patient will require inpatient hospital care spanning beyond 2 midnights from the point of admission due to high intensity of service, high risk for further deterioration and high frequency of surveillance required.*    For questions or updates, please contact [place] Please consult [email or web address] for contact info under    Signed, Jackie Dopp, PA-C  02/03/2020 1:20 PM

## 2020-02-03 NOTE — Progress Notes (Signed)
Cardiology Office Note:    Date:  02/03/2020   ID:  [name], DOB Jun 15, 1941, MRN [id number]  PCP:  [name], MD  Cardiologist:  Sinclair Grooms, MD   Electrophysiologist:  Thompson Grayer, MD   Referring MD: [name], MD   Chief Complaint:  Follow-up (CHF)    Patient Profile:    Jackie Parker is a 79 y.o. female with:   Persistent atrial fibrillation  CHA2DS2-VASc=4 (female, HTN, age x 2) >> no anticoagulation due to uterine bleeding    Amiodarone Rx  Hypertension  Ulcerative colitis  S/p colectomy 1977  Osteoarthritis  S/p bilateral TKR  Chronic hypokalemia  Psoriasis   Asthma   Postmenopausal uterine bleeding  Prior CV studies: Echocardiogram 08/02/2019 EF 55-60, mild LVH, no R WMA, normal RV SF, trace MR, mild TR, mild aortic valve sclerosis (no AS), RVSP 46.4 (moderately elevated)   History of Present Illness:    Jackie Parker has been followed by the [facility] and Dr. Rayann Heman since Dec 2020.  She was initially noted to have AFib with RVR when she presented for a D&C.  She could not start anticoagulation due to ongoing uterine bleeding.  She is followed by Gyn.  She has ultimately been placed on Amiodarone for rate control.  She has subsequently converted to normal sinus rhythm.  She has noted shortness of breath and leg swelling.  She is treated with Lasix.  She was last seen by Dr. Rayann Heman in 11/2019 and he requested follow up with general cardiology.    She is here today with her daughter.  She arrives in a wheelchair.  She notes significant dyspnea with minimal activity and at rest.  She has not had chest pain.  She does not orthopnea.  Her leg edema goes up to her knees.  She also feels her abdomen is swollen.     Past Medical History:  Diagnosis Date  . AKI (acute kidney injury) ([place]) 12/2015  . Allergy    takes Singulair and Zyrtec daily  . Asthma    uses Adair daily  . Cataracts, bilateral   . Colitis, ulcerative ([place])   .  DJD (degenerative joint disease)   . Glaucoma    uses eye drops daily  . History of blood transfusion    no abnormal reaction noted. 40+ yrs ago  . History of bronchitis    yrs ago  . History of gout    not on any meds  . Hypertension    takes Amlodipine daily  . Joint pain   . Joint swelling   . OSA (obstructive sleep apnea) 12/17/2019  . PMB (postmenopausal bleeding)   . Psoriasis   . Urinary frequency    takes Ditropan daily  . Vertigo    doesn't take any meds  . Vitamin D deficiency     Current Medications: Current Meds  Medication Sig  . albuterol (PROAIR HFA) 108 (90 Base) MCG/ACT inhaler Inhale 1 puff into the lungs every 6 (six) hours as needed for wheezing or shortness of breath.  Marland Kitchen albuterol (PROVENTIL) (2.5 MG/3ML) 0.083% nebulizer solution Take by nebulization. tAKE 2 ML NEBULIZATION TWICE DAILY AS NEEDED FOR WHEEZING OR SHORTNESS OF BREATH  . allopurinol (ZYLOPRIM) 100 MG tablet TAKE 1 TABLET(100 MG) BY MOUTH DAILY  . amiodarone (PACERONE) 200 MG tablet Take 1 tablet (200 mg total) by mouth daily.  . brimonidine (ALPHAGAN) 0.2 % ophthalmic solution Place 1 drop into the right eye 3 (three) times daily.  Marland Kitchen  brinzolamide (AZOPT) 1 % ophthalmic suspension Place 1 drop into the right eye 3 (three) times daily.   . colchicine 0.6 MG tablet Take 2 tabs (1.2 mg) at the onset of a gout attack, may repeat 1 tab (0.6 mg) 2 hours later if symptoms persist.  . diltiazem (CARDIZEM CD) 360 MG 24 hr capsule Take 1 capsule (360 mg total) by mouth daily.  . fluocinonide ointment (LIDEX) 9.76 % Apply 1 application topically 2 (two) times daily as needed for itching.  . Fluticasone-Salmeterol (ADVAIR DISKUS) 500-50 MCG/DOSE AEPB Inhale 1 puff into the lungs in the morning and at bedtime.  . furosemide (LASIX) 40 MG tablet Take 1 tablet (40 mg total) by mouth daily.  Marland Kitchen LUMIGAN 0.01 % SOLN Place 1 drop into both eyes at bedtime.  . medroxyPROGESTERone (PROVERA) 10 MG tablet Take 2 tablets  (20 mg total) by mouth daily.  . potassium chloride (KLOR-CON) 10 MEQ tablet Take 4 tablets (40 mEq total) by mouth 2 (two) times daily for 5 days.  . prochlorperazine (COMPAZINE) 10 MG tablet TAKE 1 TABLET(10 MG) BY MOUTH TWICE DAILY AS NEEDED FOR NAUSEA OR VOMITING   Current Facility-Administered Medications for the 02/03/20 encounter (Office Visit) with Richardson Dopp T, PA-C  Medication  . indomethacin (INDOCIN) capsule 25 mg     Allergies:   Megace es [megestrol acetate] and Sulfonamide derivatives   Social History   Tobacco Use  . Smoking status: Never Smoker  . Smokeless tobacco: Never Used  Substance Use Topics  . Alcohol use: No    Alcohol/week: 0.0 standard drinks  . Drug use: No     Family Hx: The patient's family history includes Glaucoma in her sister; Hypertension in her sister. There is no history of Endometrial cancer or Ovarian cancer.  Review of Systems  Constitution: Negative for fever.  Respiratory: Positive for wheezing.   Gastrointestinal: Positive for bloating. Negative for hematochezia and melena.  Genitourinary: Negative for hematuria.  All other systems reviewed and are negative.    EKGs/Labs/Other Test Reviewed:    EKG:  EKG is not  ordered today.  The ekg ordered today demonstrates n/a  Recent Labs: 08/01/2019: B Natriuretic Peptide 209.8 08/04/2019: Magnesium 1.9 11/13/2019: Hemoglobin 12.4; Platelets 150 12/02/2019: ALT 13; NT-Pro BNP 779; TSH 1.610 01/01/2020: BUN 13; Creatinine, Ser 2.04; Potassium 3.6; Sodium 146   Recent Lipid Panel Lab Results  Component Value Date/Time   CHOL 144 04/26/2019 09:58 AM   TRIG 101 04/26/2019 09:58 AM   HDL 61 04/26/2019 09:58 AM   CHOLHDL 2.4 04/26/2019 09:58 AM   CHOLHDL 2.6 11/09/2016 10:54 AM   LDLCALC 63 04/26/2019 09:58 AM    Physical Exam:    VS:  BP (!) 102/50   Pulse 87   Ht 5' 2"  (1.575 m)   Wt 256 lb (116.1 kg)   SpO2 93%   BMI 46.82 kg/m     Wt Readings from Last 3 Encounters:   02/03/20 256 lb (116.1 kg)  01/22/20 253 lb 3.2 oz (114.9 kg)  01/20/20 253 lb (114.8 kg)     Constitutional:      Appearance: Healthy appearance. Not in distress.  Neck:     Vascular: JVD normal.     Lymphadenopathy: No cervical adenopathy.     Comments: JVP to angle of jaw at 90 degrees Pulmonary:     Effort: Pulmonary effort is normal.     Breath sounds: Diffuse Wheezing (diffuse) present. Bibasilar Rales (faint bilateral) present.  Cardiovascular:  Normal rate. Regular rhythm. Normal S1. Normal S2.     Murmurs: There is no murmur.  Edema:    Pretibial: bilateral 4+ pitting edema of the pretibial area. Abdominal:     General: There is distension.     Comments: edematous  Skin:    General: Skin is warm and dry.  Neurological:     Mental Status: Alert and oriented to person, place and time.     Cranial Nerves: Cranial nerves are intact.  Psychiatric:        Mood and Affect: Affect normal.       ASSESSMENT & PLAN:    1. Acute on chronic diastolic CHF (congestive heart failure) (HCC) EF 55-60 by echocardiogram November 2020.  RVSP was 46.4 at that time.  The patient notes her weight was around 188 pounds prior to going to the hospital in November 2020.  She weighs 256 pounds today.  She is massively volume overloaded on exam today.  Her neck veins are visible up to the angle of her jaw while seated at 90 degrees.  She has brawny edema up to her knees and noted abdominal swelling with palpation.  I have recommended admission to the hospital for IV diuresis.  I reviewed this with Dr. Tamala Julian (attending MD) who also saw the patient and agrees.  We will arrange for bed at [facility] and start IV furosemide 60 mg twice daily.  She has a history of hypokalemia.  I will adjust her potassium supplementation to 40 mEq 3 times a day.  We will asked the advanced heart failure team to also consult on the patient as she likely needs more extensive evaluation for her heart failure.   She seems to have more right-sided symptoms than left.  Question if she should have work-up for restrictive cardiomyopathy or amyloid.  2. Persistent atrial fibrillation (HCC) Maintaining sinus rhythm.  She has not been placed anticoagulation due to significant bleeding risk.  She remains on amiodarone.  3. Essential hypertension Blood pressure well controlled.  Continue current dose of diltiazem.  4. Dysfunctional uterine bleeding The patient notes that her bleeding has resolved.  She notes that her gynecologist recently noted that she can likely start anticoagulation soon.    Dispo:  Return for Follow up After Dischage from Hospital.   Medication Adjustments/Labs and Tests Ordered: Current medicines are reviewed at length with the patient today.  Concerns regarding medicines are outlined above.  Tests Ordered: No orders of the defined types were placed in this encounter.  Medication Changes: No orders of the defined types were placed in this encounter.   Signed, Richardson Dopp, PA-C  02/03/2020 1:31 PM    [facility] [place], [place], [place]  [address] Phone: [phone]; Fax: [phone]

## 2020-02-04 ENCOUNTER — Inpatient Hospital Stay (HOSPITAL_COMMUNITY): Payer: Medicare Other

## 2020-02-04 ENCOUNTER — Other Ambulatory Visit (HOSPITAL_COMMUNITY): Payer: Medicare Other

## 2020-02-04 ENCOUNTER — Encounter (HOSPITAL_COMMUNITY): Payer: Self-pay | Admitting: Interventional Cardiology

## 2020-02-04 LAB — BASIC METABOLIC PANEL
Anion gap: 7 (ref 5–15)
BUN: 17 mg/dL (ref 8–23)
CO2: 24 mmol/L (ref 22–32)
Calcium: 8.3 mg/dL — ABNORMAL LOW (ref 8.9–10.3)
Chloride: 110 mmol/L (ref 98–111)
Creatinine, Ser: 2.89 mg/dL — ABNORMAL HIGH (ref 0.44–1.00)
GFR calc Af Amer: 17 mL/min — ABNORMAL LOW (ref >60)
GFR calc non Af Amer: 15 mL/min — ABNORMAL LOW (ref >60)
Glucose, Bld: 80 mg/dL (ref 70–99)
Potassium: 4.1 mmol/L (ref 3.5–5.1)
Sodium: 141 mmol/L (ref 135–145)

## 2020-02-04 MED ORDER — PNEUMOCOCCAL VAC POLYVALENT 25 MCG/0.5ML IJ INJ
0.5000 mL | INJECTION | INTRAMUSCULAR | Status: AC
  Administered 2020-02-06: 0.5 mL via INTRAMUSCULAR

## 2020-02-04 NOTE — Progress Notes (Signed)
Pt refusing cpap tonight. RT will continue to monitor as needed.

## 2020-02-04 NOTE — Progress Notes (Addendum)
Progress Note  Patient Name: [place] Date of Encounter: 02/04/2020  Primary Cardiologist: Sinclair Grooms, MD   Subjective   Breathing unchanged from yesterday - still has some shortness of breath. No chest pain. She feels like she is having good urinary response to the IV Lasix.  Inpatient Medications    Scheduled Meds:  amiodarone  200 mg Oral Daily   brimonidine  1 drop Right Eye TID   brinzolamide  1 drop Right Eye TID   diltiazem  360 mg Oral Daily   enoxaparin (LOVENOX) injection  40 mg Subcutaneous Q24H   furosemide  60 mg Intravenous BID   latanoprost  1 drop Both Eyes QHS   medroxyPROGESTERone  20 mg Oral Daily   mometasone-formoterol  2 puff Inhalation BID   potassium chloride  40 mEq Oral TID   sodium chloride flush  3 mL Intravenous Q12H   Continuous Infusions:  sodium chloride     PRN Meds: sodium chloride, acetaminophen, albuterol, allopurinol, fluocinonide ointment, sodium chloride flush   Vital Signs    Vitals:   02/04/20 0045 02/04/20 0514 02/04/20 0725 02/04/20 0755  BP:  135/71  (!) 128/55  Pulse:  85  88  Resp:  17  17  Temp:  98.1 F (36.7 C)  98.6 F (37 C)  TempSrc:  Oral  Oral  SpO2:  97% 93% 94%  Weight: 103.6 kg     Height:        Intake/Output Summary (Last 24 hours) at 02/04/2020 0948 Last data filed at 02/04/2020 0600 Gross per 24 hour  Intake --  Output 600 ml  Net -600 ml   Last 3 Weights 02/04/2020 02/03/2020 02/03/2020  Weight (lbs) 228 lb 6.4 oz 253 lb 15.5 oz 256 lb  Weight (kg) 103.602 kg 115.2 kg 116.121 kg      Telemetry    Normal sinus rhythm with rates in the 80's to 90's. - Personally Reviewed  ECG    No new ECG tracing today. - Personally Reviewed  Physical Exam   GEN: Morbidly obese African-American female. No acute distress.   Neck: JVD elevated. Cardiac: RRR. No murmurs, rubs, or gallops.  Respiratory: No increased work of breathing. Mild crackles noted in bilateral bases. Scattered wheezing  as well. GI: Soft and non-tender but distended. Ileostomy in place.  MS: 3+ pitting edema of bilateral lower extremities. No deformity. Neuro:  No focal deficits. Psych: Normal affect. Responds appropriately.  Labs    High Sensitivity Troponin:  No results for input(s): TROPONINIHS in the last 720 hours.    Chemistry Recent Labs  Lab 02/03/20 1844 02/04/20 0500  NA 141 141  K 4.8 4.1  CL 108 110  CO2 22 24  GLUCOSE 100* 80  BUN 19 17  CREATININE 3.01* 2.89*  CALCIUM 8.6* 8.3*  PROT 8.0  --   ALBUMIN 2.8*  --   AST 42*  --   ALT 16  --   ALKPHOS 47  --   BILITOT 1.9*  --   GFRNONAA 14* 15*  GFRAA 16* 17*  ANIONGAP 11 7     Hematology Recent Labs  Lab 02/03/20 1844  WBC 7.4  RBC 4.16  HGB 12.5  HCT 39.2  MCV 94.2  MCH 30.0  MCHC 31.9  RDW 16.9*  PLT 150    BNP Recent Labs  Lab 02/03/20 1844  BNP 222.9*     DDimer No results for input(s): DDIMER in the last 168 hours.  Radiology    DG Chest 2 View  Result Date: 02/04/2020 CLINICAL DATA:  Volume overload EXAM: CHEST - 2 VIEW COMPARISON:  November 27, 2019 FINDINGS: There is slight left base atelectasis. Lungs elsewhere clear. Heart is mildly enlarged with pulmonary vascularity normal. No adenopathy. There is degenerative change in the thoracic spine. There is postoperative change in the lower cervical region. IMPRESSION: Slight left base atelectasis. Lungs elsewhere clear. Heart mildly enlarged. No adenopathy. Electronically Signed   By: Lowella Grip III M.D.   On: 02/04/2020 08:44    Cardiac Studies   Echocardiogram 08/02/2019: Impressions:  1. Left ventricular ejection fraction, by visual estimation, is 55 to  60%. The left ventricle has normal function. There is mildly increased  left ventricular hypertrophy.   2. The left ventricle has no regional wall motion abnormalities.   3. Global right ventricle has normal systolic function.The right  ventricular size is normal. No increase in right  ventricular wall  thickness.   4. Left atrial size was normal.   5. Right atrial size was normal.   6. The mitral valve is normal in structure. Trace mitral valve  regurgitation. No evidence of mitral stenosis.   7. The tricuspid valve is normal in structure. Tricuspid valve  regurgitation is mild.   8. The aortic valve is normal in structure. Aortic valve regurgitation is  not visualized. Mild aortic valve sclerosis without stenosis.   9. There is Mild calcification of the aortic valve.  10. There is Mild thickening of the aortic valve.  11. The pulmonic valve was grossly normal. Pulmonic valve regurgitation is  not visualized.  12. Moderately elevated pulmonary artery systolic pressure.  13. The inferior vena cava is dilated in size with <50% respiratory  variability, suggesting right atrial pressure of 15 mmHg.  Patient Profile     79 y.o. female with a history of persistent atrial fibrillation on Amiodarone but no anticoagulation due to uterine bleeding, hypertension, chronic hypokalemia, asthma, ulcerative colitis s/p colectomy in 1977 who was directly admitted from the office on 02/03/2020 for acute on chronic diastolic dysfunction after being found to be massively volume overloaded on exam.  Assessment & Plan    Acute on Chronic Diastolic CHF - Patient presented with dyspnea on exertion, orthopnea, and slow 50lb weight gain over the last 6 months.  - BNP elevated at 222.  - Chest x-ray showed slight left base atelectasis. - Echo from 08/02/2019 showed LVEF of 55-60% with normal wall motion, mild LVH, and moderately elevated PASP. - Will repeat Echo. - Started on IV Lasix 4m twice daily. No documented urinary output so far.  - Continue current dose of IV Lasix. - Continue to monitor daily weights, strict I/O's, and renal function.  - Will discuss whether we need to consult advance CHF team with MD.  Persistent Atrial Fibrillation - Maintaining sinus rhythm.  - Continue  Diltiazem 3671mdaily and PO Amiodarone 20065maily. - Not on chronic anticoagulation due to uterine bleeding.  Hypertension - BP relatively well controlled. - Continue Diltiazem as above.  Chronic Hypokalemia - Potassium stable at 4.1.  - Continue K-Dur 40 mEq three times daily.  - Continue to monitor closely.  Dysfunctional Uterine Bleeding - Patient reports that bleeding has resolved and notes Gynecologist recently told her that she can likely start anticoagulation soon.  For questions or updates, please contact [facility] consult [email or web address] for contact info under        Signed, CalDarreld McleanA-C  02/04/2020, 9:48 AM    Patient examined chart reviewed. Exam with elderly black female sitting in chair. JVP elevated distant heart sounds lungs clear Post bilateral TKRls and edema To mid thigh. This has accumulated over 6 months.  Continue iv diuresis Telemetry with chronic afib Will involve CHF team after repeat echo and some diuresis Will need right heart cath Friday or early next week Consider starting eliquis post right heart cath  Jenkins Rouge MD [facility]

## 2020-02-05 ENCOUNTER — Inpatient Hospital Stay (HOSPITAL_COMMUNITY): Payer: Medicare Other

## 2020-02-05 DIAGNOSIS — I5031 Acute diastolic (congestive) heart failure: Secondary | ICD-10-CM

## 2020-02-05 LAB — BASIC METABOLIC PANEL
Anion gap: 11 (ref 5–15)
BUN: 17 mg/dL (ref 8–23)
CO2: 23 mmol/L (ref 22–32)
Calcium: 8.4 mg/dL — ABNORMAL LOW (ref 8.9–10.3)
Chloride: 107 mmol/L (ref 98–111)
Creatinine, Ser: 2.93 mg/dL — ABNORMAL HIGH (ref 0.44–1.00)
GFR calc Af Amer: 17 mL/min — ABNORMAL LOW (ref >60)
GFR calc non Af Amer: 15 mL/min — ABNORMAL LOW (ref >60)
Glucose, Bld: 80 mg/dL (ref 70–99)
Potassium: 4.6 mmol/L (ref 3.5–5.1)
Sodium: 141 mmol/L (ref 135–145)

## 2020-02-05 LAB — ECHOCARDIOGRAM COMPLETE
Height: 62 in
Weight: 4004.8 oz

## 2020-02-05 NOTE — Progress Notes (Signed)
Spoke to patient about using a CPAP tonight.  She informed me that she did have a sleep study done.  RN listened to me explain that it would be beneficial for her if she could learn to use one.  I explained that it can be a little awkward at first but the benefits of using one are great.  Patient stated I could bring one up but she wouldn't use it.  I did get patient to say that she would at least think about it.  Will try again to encourage her to try it.

## 2020-02-05 NOTE — Consult Note (Signed)
[facility]  INPATIENT CONSULTATION  Reason for Consultation: AKI on CKD Requesting Provider: Dr. Johnsie Cancel  HPI: [place] is an 79 y.o. female with atrial fibrillation, HFpEF, HTN, ulcerative colitis, psoriasis, asthma, post menopausal bleeding, gout, OSA, obesity who is seen for consultation regarding AKI on CKD.   She was seen in clinic 5/24 and noted to be massively volume overloaded with 50lb wt gain over 6 months, JVD to mandible upright, edema.  She was admitted for IV diuresis.  She has been treated with lasix 60 IV BID and is net negative 1.8L by I/Os;  Admission wt 115.2kg, this AM 113.5kg.  She reports feeling improved edema and orthopnea.  No NSAID use at home.  No LUTs, hematuria, foamy urine, no dysuria.    Her A fib is treated with amiodarone and diltiazem; she's been of anticoagulation due to uterine bleeding but may resume soon.   Appears recently creatinine usually in the 1.7-10m/dL range.  In 12/2019 was 2.4 > 2.0.  This admission Cr 3.0 5/24 and this AM 2.91mdL.  A 01/22/20 CT obtained for possible uterine mass showed ok kidneys (simple cyst) without obstruction.  A [place] 07/2019 showed blood without protein ? Uterine bleeding co-mixing.   PMH: Past Medical History:  Diagnosis Date  . AKI (acute kidney injury) ([facility]  . Allergy    takes Singulair and Zyrtec daily  . Asthma    uses Adair daily  . Cataracts, bilateral   . CHF (congestive heart failure) ([facility]  . Colitis, ulcerative ([facility]  . DJD (degenerative joint disease)   . Glaucoma    uses eye drops daily  . History of blood transfusion    no abnormal reaction noted. 40+ yrs ago  . History of bronchitis    yrs ago  . History of gout    not on any meds  . Hypertension    takes Amlodipine daily  . Joint pain   . Joint swelling   . OSA (obstructive sleep apnea) 12/17/2019  . PMB (postmenopausal bleeding)   . Psoriasis   . Urinary frequency    takes Ditropan daily  . Vertigo     doesn't take any meds  . Vitamin D deficiency    PSH: Past Surgical History:  Procedure Laterality Date  . BACK SURGERY    . COLONOSCOPY    . DILATATION & CURETTAGE/HYSTEROSCOPY WITH MYOSURE N/A 08/05/2019   Procedure: DILATATION & CURETTAGE/HYSTEROSCOPY WITH MYOSURE;  Surgeon: CoChristophe LouisMD;  [facility] Service: [facility]necology;  Laterality: N/A;  Myosure rep will be here confirmed on 08/01/19 CS  . ILEOSTOMY    . KNEE ARTHROPLASTY Left 12/14/2015   Procedure: COMPUTER ASSISTED LEFT TOTAL KNEE ARTHROPLASTY;  Surgeon: JaJessy OtoMD;  [facility] Service: Orthopedics;  Laterality: Left;  . TOTAL COLECTOMY      total colectomy in 1977  . TOTAL KNEE ARTHROPLASTY Left 12/14/2015  . TOTAL KNEE ARTHROPLASTY Right 06/27/2016  . TOTAL KNEE ARTHROPLASTY Right 06/27/2016   Procedure: RIGHT TOTAL KNEE ARTHROPLASTY;  Surgeon: JaJessy OtoMD;  [facility] Service: Orthopedics;  Laterality: Right;  . TUBAL LIGATION      Past Medical History:  Diagnosis Date  . AKI ([place] kidney [place]) ([facility]/2017  . Allergy    takes Singulair and Zyrtec daily  . Asthma    uses Adair daily  . Cataracts, bilateral   . CHF (congestive heart failure) ([facility]CD[place]  . Colitis, ulcerative (  [place])   . DJD (degenerative joint disease)   . Glaucoma    uses eye drops daily  . History of blood transfusion    no abnormal reaction noted. 40+ yrs ago  . History of bronchitis    yrs ago  . History of gout    not on any meds  . Hypertension    takes Amlodipine daily  . Joint pain   . Joint swelling   . OSA (obstructive sleep apnea) 12/17/2019  . PMB (postmenopausal bleeding)   . Psoriasis   . Urinary frequency    takes Ditropan daily  . Vertigo    doesn't take any meds  . Vitamin D deficiency     Medications:  I have reviewed the patient's current medications.  Facility-Administered Medications Prior to Admission  Medication Dose Route Frequency Provider Last Rate Last Admin  . indomethacin  (INDOCIN) capsule 25 mg  25 mg Oral BID WC Jessy Oto, MD       Medications Prior to Admission  Medication Sig Dispense Refill  . albuterol (PROAIR HFA) 108 (90 Base) MCG/ACT inhaler Inhale 1 puff into the lungs every 6 (six) hours as needed for wheezing or shortness of breath.    Marland Kitchen albuterol (PROVENTIL) (2.5 MG/3ML) 0.083% nebulizer solution Take 2.5 mg by nebulization 2 (two) times daily as needed for wheezing or shortness of breath.     . allopurinol (ZYLOPRIM) 100 MG tablet TAKE 1 TABLET(100 MG) BY MOUTH DAILY (Patient taking differently: Take 100 mg by mouth as needed (gout). ) 30 tablet 1  . amiodarone (PACERONE) 200 MG tablet Take 1 tablet (200 mg total) by mouth daily. 30 tablet 3  . brimonidine (ALPHAGAN) 0.2 % ophthalmic solution Place 1 drop into the right eye 3 (three) times daily.    . brinzolamide (AZOPT) 1 % ophthalmic suspension Place 1 drop into the right eye 3 (three) times daily.     . colchicine 0.6 MG tablet Take 2 tabs (1.2 mg) at the onset of a gout attack, may repeat 1 tab (0.6 mg) 2 hours later if symptoms persist. 30 tablet 1  . diltiazem (CARDIZEM CD) 360 MG 24 hr capsule Take 1 capsule (360 mg total) by mouth daily. 30 capsule 11  . fluocinonide ointment (LIDEX) 1.61 % Apply 1 application topically 2 (two) times daily as needed for itching.    . Fluticasone-Salmeterol (ADVAIR DISKUS) 500-50 MCG/DOSE AEPB Inhale 1 puff into the lungs in the morning and at bedtime. 60 each 2  . furosemide (LASIX) 40 MG tablet Take 1 tablet (40 mg total) by mouth daily. 30 tablet 2  . LUMIGAN 0.01 % SOLN Place 1 drop into both eyes at bedtime.    . medroxyPROGESTERone (PROVERA) 10 MG tablet Take 2 tablets (20 mg total) by mouth daily. 360 tablet 1  . potassium chloride (KLOR-CON) 10 MEQ tablet Take 4 tablets (40 mEq total) by mouth 2 (two) times daily for 5 days. (Patient taking differently: Take 20 mEq by mouth 2 (two) times daily. ) 40 tablet 0  . prochlorperazine (COMPAZINE) 10 MG  tablet TAKE 1 TABLET(10 MG) BY MOUTH TWICE DAILY AS NEEDED FOR NAUSEA OR VOMITING (Patient taking differently: Take 10 mg by mouth as needed for nausea or vomiting. ) 30 tablet 0    ALLERGIES:   Allergies  Allergen Reactions  . Megace Es [Megestrol Acetate] Shortness Of Breath  . Sulfonamide Derivatives Rash    FAM HX: Family History  Problem Relation Age of Onset  .  Hypertension Sister   . Glaucoma Sister   . Endometrial cancer Neg Hx   . Ovarian cancer Neg Hx     Social History:   reports that she has never smoked. She has never used smokeless tobacco. She reports that she does not drink alcohol or use drugs.  ROS: 12 system ROS neg except per HPI  Blood pressure 127/66, pulse 83, temperature 98.7 F (37.1 C), temperature source Oral, resp. rate 17, height 5' 2"  (1.575 m), weight 113.5 kg, SpO2 100 %. PHYSICAL EXAM: Gen: obese woman at 45 degrees in bed  Eyes:  anicteric ENT: MMM Neck: JVD to mandible at 45 degrees CV:  RRR, no rub Abd:  Soft, obese Lungs: bibasilar rales, normal WOB at rest GU: purewick with clear urine in cannister Extr: 2+ pitting LE edema  Neuro: nonfocal Skin: hyperpigmented patches on LE from psoriasis   Results for orders placed or performed during the hospital encounter of 02/03/20 (from the past 48 hour(s))  SARS CORONAVIRUS 2 (TAT 6-24 HRS) Nasopharyngeal Nasopharyngeal Swab     Status: None   Collection Time: 02/03/20  6:41 PM   Specimen: Nasopharyngeal Swab  Result Value Ref Range   SARS Coronavirus 2 NEGATIVE NEGATIVE    Comment: (NOTE) SARS-CoV-2 target nucleic acids are NOT DETECTED. The SARS-CoV-2 RNA is generally detectable in upper and lower respiratory specimens during the acute phase of infection. Negative results do not preclude SARS-CoV-2 infection, do not rule out co-infections with other pathogens, and should not be used as the sole basis for treatment or other patient management decisions. Negative results must be  combined with clinical observations, patient history, and epidemiological information. The expected result is Negative. Fact Sheet for Patients: SugarRoll.be Fact Sheet for Healthcare Providers: https://www.woods-mathews.com/ This test is not yet approved or cleared by the [place] FDA and  has been authorized for detection and/or diagnosis of SARS-CoV-2 by FDA under an Emergency Use Authorization (EUA). This EUA will remain  in effect (meaning this test can be used) for the duration of the COVID-19 declaration under Section 56 4(b)(1) of the Act, 21 U.S.C. section 360bbb-3(b)(1), unless the authorization is terminated or revoked sooner. Performed at [facility], [place] [address] [place], [place] [address]   Comprehensive metabolic panel     Status: Abnormal   Collection Time: 02/03/20  6:44 PM  Result Value Ref Range   Sodium 141 135 - 145 mmol/L   Potassium 4.8 3.5 - 5.1 mmol/L    Comment: HEMOLYSIS AT THIS LEVEL MAY AFFECT RESULT   Chloride 108 98 - 111 mmol/L   CO2 22 22 - 32 mmol/L   Glucose, Bld 100 (H) 70 - 99 mg/dL    Comment: Glucose reference range applies only to samples taken after fasting for at least 8 hours.   BUN 19 8 - 23 mg/dL   Creatinine, Ser 3.01 (H) 0.44 - 1.00 mg/dL   Calcium 8.6 (L) 8.9 - 10.3 mg/dL   Total Protein 8.0 6.5 - 8.1 g/dL   Albumin 2.8 (L) 3.5 - 5.0 g/dL   AST 42 (H) 15 - 41 U/L   ALT 16 0 - 44 U/L   Alkaline Phosphatase 47 38 - 126 U/L   Total Bilirubin 1.9 (H) 0.3 - 1.2 mg/dL   GFR calc non Af Amer 14 (L) >60 mL/min   GFR calc Af Amer 16 (L) >60 mL/min   Anion gap 11 5 - 15    Comment: Performed at [facility]  Lab, [address], [place], [place] [address]  Brain natriuretic peptide     Status: Abnormal   Collection Time: 02/03/20  6:44 PM  Result Value Ref Range   B Natriuretic Peptide 222.9 (H) 0.0 - 100.0 pg/mL    Comment: Performed at [facility] [address]  [place], [place] [address]  CBC WITH DIFFERENTIAL     Status: Abnormal   Collection Time: 02/03/20  6:44 PM  Result Value Ref Range   WBC 7.4 4.0 - 10.5 K/uL   RBC 4.16 3.87 - 5.11 MIL/uL   Hemoglobin 12.5 12.0 - 15.0 g/dL   HCT 39.2 36.0 - 46.0 %   MCV 94.2 80.0 - 100.0 fL   MCH 30.0 26.0 - 34.0 pg   MCHC 31.9 30.0 - 36.0 g/dL   RDW 16.9 (H) 11.5 - 15.5 %   Platelets 150 150 - 400 K/uL   nRBC 0.0 0.0 - 0.2 %   Neutrophils Relative % 50 %   Neutro Abs 3.7 1.7 - 7.7 K/uL   Lymphocytes Relative 32 %   Lymphs Abs 2.4 0.7 - 4.0 K/uL   Monocytes Relative 16 %   Monocytes Absolute 1.2 (H) 0.1 - 1.0 K/uL   Eosinophils Relative 1 %   Eosinophils Absolute 0.1 0.0 - 0.5 K/uL   Basophils Relative 1 %   Basophils Absolute 0.1 0.0 - 0.1 K/uL   Immature Granulocytes 0 %   Abs Immature Granulocytes 0.02 0.00 - 0.07 K/uL    Comment: Performed at [facility] [address] [place], [place] [address]  Basic metabolic panel     Status: Abnormal   Collection Time: 02/04/20  5:00 AM  Result Value Ref Range   Sodium 141 135 - 145 mmol/L   Potassium 4.1 3.5 - 5.1 mmol/L   Chloride 110 98 - 111 mmol/L   CO2 24 22 - 32 mmol/L   Glucose, Bld 80 70 - 99 mg/dL    Comment: Glucose reference range applies only to samples taken after fasting for at least 8 hours.   BUN 17 8 - 23 mg/dL   Creatinine, Ser 2.89 (H) 0.44 - 1.00 mg/dL   Calcium 8.3 (L) 8.9 - 10.3 mg/dL   GFR calc non Af Amer 15 (L) >60 mL/min   GFR calc Af Amer 17 (L) >60 mL/min   Anion gap 7 5 - 15    Comment: Performed at [facility] [address], [place], [place] [address]  Basic metabolic panel     Status: Abnormal   Collection Time: 02/05/20  6:06 AM  Result Value Ref Range   Sodium 141 135 - 145 mmol/L   Potassium 4.6 3.5 - 5.1 mmol/L   Chloride 107 98 - 111 mmol/L   CO2 23 22 - 32 mmol/L   Glucose, Bld 80 70 - 99 mg/dL    Comment: Glucose reference range applies only to samples taken after fasting for at least 8  hours.   BUN 17 8 - 23 mg/dL   Creatinine, Ser 2.93 (H) 0.44 - 1.00 mg/dL   Calcium 8.4 (L) 8.9 - 10.3 mg/dL   GFR calc non Af Amer 15 (L) >60 mL/min   GFR calc Af Amer 17 (L) >60 mL/min   Anion gap 11 5 - 15    Comment: Performed at [facility] [address], [place], [place] [address]    DG Chest 2 View  Result Date: 02/04/2020 CLINICAL DATA:  Volume overload EXAM: CHEST - 2 VIEW COMPARISON:  November 27, 2019 FINDINGS: There is slight left base atelectasis. Lungs elsewhere clear. Heart is mildly enlarged with pulmonary vascularity normal. No adenopathy. There is degenerative change in the thoracic spine. There is postoperative change in the lower cervical region. IMPRESSION: Slight left base atelectasis. Lungs elsewhere clear. Heart mildly enlarged. No adenopathy. Electronically Signed   By: Lowella Grip III M.D.   On: 02/04/2020 08:44    Assessment/Plan **AKI on CKD 3b:  Longstanding CKD which is most likely HTN nephrosclerosis.  She had  CT earlier this month with no obstruction and I don't think repeating is necessary.  She's in for obligatory diuresis in setting of massive volume overload --> continue with diuresis, checking daily labs.  Will need to be slow and steady process.  Avoid hypotension, nephrotoxins.   Discussed her current GFR, CKD staging and what constitutes ESRD.  Hopefully we can avoid need for RRT this admission --> briefly introduced the concept and will go into more detail per progress.  Will follow her in [facility] where she has not been seen before.   **Acute on chronic HFpEF:  Cardiology following; TTE repeating today.  Needs continued diuresis and rate seems fine for now - cont lasix 60 IV, I/Os, na/fluid restrict, daily wegith.   **A fib:  Per cardiology  **HTN: BPs fine here.   Justin Mend 02/05/2020, 10:29 AM

## 2020-02-05 NOTE — TOC Initial Note (Addendum)
Transition of Care [facility]) - Initial/Assessment Note    Patient Details  Name: Jackie Parker MRN: [id number] Date of Birth: [birth year]  Transition of Care [facility]) CM/SW Contact:    Emeterio Reeve, [place] Phone Number: 02/05/2020, 2:46 PM  Clinical Narrative:                  CSW met with pt at bedside. CSW introduced self and explained her role at the hospital. Pts daughter was at bedside. Pt states she lives at home with her daughter. Pt states that her daughter doesn't work and is available to help her with ADL's when needed. PT states shes mostly independent. Pt states she has a walker, 3n1 and shower chair at homes but only uses the shower chair.   Pt states she does not need to go to a SNF and that her daughter can provide 24 hour care and assistance. CSW inquired about HH and pt declined and said her daughter can help with everything she needs. Daughter was in agreement with pt.   Expected Discharge Plan: Home/Self Care Barriers to Discharge: No Barriers Identified   Patient Goals and CMS Choice Patient states their goals for this hospitalization and ongoing recovery are:: To return home      Expected Discharge Plan and Services Expected Discharge Plan: Home/Self Care       Living arrangements for the past 2 months: Single Family Home                                      Prior Living Arrangements/Services Living arrangements for the past 2 months: Single Family Home Lives with:: Adult Children Patient language and need for interpreter reviewed:: Yes        Need for Family Participation in Patient Care: Yes (Comment) Care giver support system in place?: Yes (comment)   Criminal Activity/Legal Involvement Pertinent to Current Situation/Hospitalization: No - Comment as needed  Activities of Daily Living Home Assistive Devices/Equipment: None ADL Screening (condition at time of admission) Patient's cognitive ability adequate to safely complete daily  activities?: Yes Is the patient deaf or have difficulty hearing?: No Does the patient have difficulty seeing, even when wearing glasses/contacts?: No Does the patient have difficulty concentrating, remembering, or making decisions?: No Patient able to express need for assistance with ADLs?: Yes Does the patient have difficulty dressing or bathing?: No Independently performs ADLs?: Yes (appropriate for developmental age) Does the patient have difficulty walking or climbing stairs?: Yes Weakness of Legs: Both Weakness of Arms/Hands: None  Permission Sought/Granted                  Emotional Assessment Appearance:: Appears stated age Attitude/Demeanor/Rapport: Engaged Affect (typically observed): Appropriate Orientation: : Oriented to Self, Oriented to Place, Oriented to  Time, Oriented to Situation Alcohol / Substance Use: Not Applicable Psych Involvement: No (comment)  Admission diagnosis:  Acute on chronic diastolic (congestive) heart failure ([place]) [I50.33] Patient Active Problem List   Diagnosis Date Noted  . Acute on chronic diastolic (congestive) heart failure ([place]) 02/03/2020  . OSA (obstructive sleep apnea) 12/17/2019  . Atrial tachycardia ([place]) 10/02/2019  . Persistent atrial fibrillation ([place]) 08/15/2019  . Secondary hypercoagulable state ([place]) 08/15/2019  . Dyspnea on exertion 08/02/2019  . Hypomagnesemia 08/02/2019  . Atrial fibrillation with RVR ([place]) 08/01/2019  . IUD check up 08/16/2017  . Asthma 01/30/2017  . Gynecologic exam normal 11/08/2016  . Urinary  incontinence 11/08/2016  . Leukocytosis 06/29/2016  . Anemia 06/29/2016  . AKI (acute kidney injury) ([place])   . Primary localized osteoarthritis of right knee 06/27/2016  . Osteoarthritis of left knee 12/14/2015    Class: End Stage  . Osteoarthritis of right knee 12/14/2015  . Acute renal failure ([place]) 08/30/2013  . 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 N&V (nausea and vomiting) 08/30/2013  .  Hypokalemia 08/30/2013  . Postmenopausal bleeding 03/18/2013  . OSTEOARTHRITIS, KNEE, LEFT 03/20/2010  . TRICUSPID REGURGITATION, MILD 03/03/2010  . Disorder resulting from impaired renal function 03/02/2010  . MORBID OBESITY 02/18/2010  . PREMATURE ATRIAL CONTRACTIONS 02/18/2010  . PREMATURE VENTRICULAR CONTRACTIONS 02/18/2010  . CARDIAC MURMUR 02/18/2010  . EDEMA 10/20/2009  . HYPOKALEMIA 07/30/2009  . RIB PAIN, RIGHT SIDED 05/26/2008  . ALLERGIC RHINITIS 06/25/2007  . ABNORMAL BLOOD CHEMISTRY , OTHER 06/25/2007  . UNSPECIFIED VITAMIN D DEFICIENCY 04/30/2007  . Hypocalcemia 04/19/2007  . GLAUCOMA NEC 04/19/2007  . Essential hypertension 04/19/2007  . Asthma 04/19/2007  . Ulcerative colitis ([place]) 04/19/2007  . DISORDER, MENSTRUAL NEC 04/19/2007  . PSORIASIS 04/19/2007  . 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 GOUT NOS 02/21/2006   PCP:  [name], MD Pharmacy:   [facility] [place], [place] [address] Phone: [phone] Fax: [phone]     Social Determinants of Health (SDOH) Interventions    Readmission Risk Interventions No flowsheet data found.  Emeterio Reeve, Latanya Presser, [place] Social Worker [phone]

## 2020-02-05 NOTE — Progress Notes (Signed)
  Echocardiogram 2D Echocardiogram has been performed.  Jackie Parker 02/05/2020, 9:48 AM

## 2020-02-05 NOTE — Progress Notes (Signed)
Progress Note  Patient Name: Jackie Parker Date of Encounter: 02/05/2020  Primary Cardiologist: Sinclair Grooms, MD   Subjective   Good diuresis legs feel better She has CRF Cr 3 in march and still high   Inpatient Medications    Scheduled Meds: . amiodarone  200 mg Oral Daily  . brimonidine  1 drop Right Eye TID  . brinzolamide  1 drop Right Eye TID  . diltiazem  360 mg Oral Daily  . enoxaparin (LOVENOX) injection  40 mg Subcutaneous Q24H  . furosemide  60 mg Intravenous BID  . latanoprost  1 drop Both Eyes QHS  . medroxyPROGESTERone  20 mg Oral Daily  . mometasone-formoterol  2 puff Inhalation BID  . pneumococcal 23 valent vaccine  0.5 mL Intramuscular Tomorrow-1000  . potassium chloride  40 mEq Oral TID  . sodium chloride flush  3 mL Intravenous Q12H   Continuous Infusions: . sodium chloride     PRN Meds: sodium chloride, acetaminophen, albuterol, allopurinol, fluocinonide ointment, sodium chloride flush   Vital Signs    Vitals:   02/04/20 2014 02/05/20 0420 02/05/20 0720 02/05/20 0840  BP: (!) 160/63 (!) 141/69  127/66  Pulse: 90 82  83  Resp: 17 17    Temp: 98.5 F (36.9 C) 98.7 F (37.1 C)    TempSrc: Oral Oral    SpO2: 97% 92% 96% 100%  Weight:  113.5 kg    Height:        Intake/Output Summary (Last 24 hours) at 02/05/2020 0857 Last data filed at 02/05/2020 6811 Gross per 24 hour  Intake 800 ml  Output 2075 ml  Net -1275 ml   Last 3 Weights 02/05/2020 02/04/2020 02/03/2020  Weight (lbs) 250 lb 4.8 oz 228 lb 6.4 oz 253 lb 15.5 oz  Weight (kg) 113.535 kg 103.602 kg 115.2 kg      Telemetry    Normal sinus rhythm with rates in the 80's to 90's. - Personally Reviewed  ECG    No new ECG tracing today. - Personally Reviewed  Physical Exam   GEN: Morbidly obese African-American female. No acute distress.   Neck: JVD elevated. Cardiac: RRR. No murmurs, rubs, or gallops.  Respiratory: No increased work of breathing. Mild crackles noted in  bilateral bases. Scattered wheezing as well. GI: Soft and non-tender but distended. Ileostomy in place.  MS: 3+ pitting edema of bilateral lower extremities. No deformity. Neuro:  No focal deficits. Psych: Normal affect. Responds appropriately.  Labs    High Sensitivity Troponin:  No results for input(s): TROPONINIHS in the last 720 hours.    Chemistry Recent Labs  Lab 02/03/20 1844 02/04/20 0500 02/05/20 0606  NA 141 141 141  K 4.8 4.1 4.6  CL 108 110 107  CO2 22 24 23   GLUCOSE 100* 80 80  BUN 19 17 17   CREATININE 3.01* 2.89* 2.93*  CALCIUM 8.6* 8.3* 8.4*  PROT 8.0  --   --   ALBUMIN 2.8*  --   --   AST 42*  --   --   ALT 16  --   --   ALKPHOS 47  --   --   BILITOT 1.9*  --   --   GFRNONAA 14* 15* 15*  GFRAA 16* 17* 17*  ANIONGAP 11 7 11      Hematology Recent Labs  Lab 02/03/20 1844  WBC 7.4  RBC 4.16  HGB 12.5  HCT 39.2  MCV 94.2  MCH 30.0  MCHC 31.9  RDW 16.9*  PLT 150    BNP Recent Labs  Lab 02/03/20 1844  BNP 222.9*     DDimer No results for input(s): DDIMER in the last 168 hours.   Radiology    DG Chest 2 View  Result Date: 02/04/2020 CLINICAL DATA:  Volume overload EXAM: CHEST - 2 VIEW COMPARISON:  November 27, 2019 FINDINGS: There is slight left base atelectasis. Lungs elsewhere clear. Heart is mildly enlarged with pulmonary vascularity normal. No adenopathy. There is degenerative change in the thoracic spine. There is postoperative change in the lower cervical region. IMPRESSION: Slight left base atelectasis. Lungs elsewhere clear. Heart mildly enlarged. No adenopathy. Electronically Signed   By: Lowella Grip III M.D.   On: 02/04/2020 08:44    Cardiac Studies   Echocardiogram 08/02/2019: Impressions:  1. Left ventricular ejection fraction, by visual estimation, is 55 to  60%. The left ventricle has normal function. There is mildly increased  left ventricular hypertrophy.   2. The left ventricle has no regional wall motion  abnormalities.   3. Global right ventricle has normal systolic function.The right  ventricular size is normal. No increase in right ventricular wall  thickness.   4. Left atrial size was normal.   5. Right atrial size was normal.   6. The mitral valve is normal in structure. Trace mitral valve  regurgitation. No evidence of mitral stenosis.   7. The tricuspid valve is normal in structure. Tricuspid valve  regurgitation is mild.   8. The aortic valve is normal in structure. Aortic valve regurgitation is  not visualized. Mild aortic valve sclerosis without stenosis.   9. There is Mild calcification of the aortic valve.  10. There is Mild thickening of the aortic valve.  11. The pulmonic valve was grossly normal. Pulmonic valve regurgitation is  not visualized.  12. Moderately elevated pulmonary artery systolic pressure.  13. The inferior vena cava is dilated in size with <50% respiratory  variability, suggesting right atrial pressure of 15 mmHg.  Patient Profile     79 y.o. female with a history of persistent atrial fibrillation on Amiodarone but no anticoagulation due to uterine bleeding, hypertension, chronic hypokalemia, asthma, ulcerative colitis s/p colectomy in 1977 who was directly admitted from the office on 02/03/2020 for acute on chronic diastolic dysfunction after being found to be massively volume overloaded on exam.  Assessment & Plan    Acute on Chronic Diastolic CHF - Patient presented with dyspnea on exertion, orthopnea, and slow 50lb weight gain over the last 6 months.  - BNP elevated at 222.  - Chest x-ray showed slight left base atelectasis. - Echo from 08/02/2019 showed LVEF of 55-60% with normal wall motion, mild LVH, and moderately elevated PASP. - Will repeat Echo.scheduled today  -  IV Lasix 14m twice daily. Responding  - Continue current dose of IV Lasix. - Continue to monitor daily weights, strict I/O's, and renal function.   Persistent Atrial  Fibrillation - Maintaining sinus rhythm.  - Continue Diltiazem 3631mdaily and PO Amiodarone 20024maily. - Not on chronic anticoagulation due to uterine bleeding.  Hypertension - BP relatively well controlled. - Continue Diltiazem as above.  Chronic Renal Failure: - this is chronic not clear she sees [facility] Suspect this has a lot to do with her volume overload  Will ask them to see in hospital Check UA for Protein / Cr ratio   Dysfunctional Uterine Bleeding - Patient reports that bleeding has resolved and notes Gynecologist recently told her  that she can likely start anticoagulation soon.  For questions or updates, please contact [place] Please consult [email or web address] for contact info under        Signed, Jenkins Rouge, MD  02/05/2020, 8:57 AM    Patient examined chart reviewed. Exam with elderly black female sitting in chair. JVP elevated distant heart sounds lungs clear Post bilateral TKRls and edema To mid thigh. This has accumulated over 6 months.  Continue iv diuresis Telemetry with chronic afib Will involve CHF team after repeat echo and some diuresis Will need right heart cath Friday or early next week Consider starting eliquis post right heart cath  Jenkins Rouge MD [facility]

## 2020-02-06 DIAGNOSIS — I480 Paroxysmal atrial fibrillation: Secondary | ICD-10-CM

## 2020-02-06 LAB — BASIC METABOLIC PANEL
Anion gap: 7 (ref 5–15)
BUN: 17 mg/dL (ref 8–23)
CO2: 25 mmol/L (ref 22–32)
Calcium: 8.3 mg/dL — ABNORMAL LOW (ref 8.9–10.3)
Chloride: 110 mmol/L (ref 98–111)
Creatinine, Ser: 3.4 mg/dL — ABNORMAL HIGH (ref 0.44–1.00)
GFR calc Af Amer: 14 mL/min — ABNORMAL LOW (ref >60)
GFR calc non Af Amer: 12 mL/min — ABNORMAL LOW (ref >60)
Glucose, Bld: 89 mg/dL (ref 70–99)
Potassium: 5.1 mmol/L (ref 3.5–5.1)
Sodium: 142 mmol/L (ref 135–145)

## 2020-02-06 LAB — URINALYSIS, ROUTINE W REFLEX MICROSCOPIC
Bilirubin Urine: NEGATIVE
Glucose, UA: NEGATIVE mg/dL
Ketones, ur: NEGATIVE mg/dL
Nitrite: NEGATIVE
Protein, ur: NEGATIVE mg/dL
Specific Gravity, Urine: 1.006 (ref 1.005–1.030)
pH: 5 (ref 5.0–8.0)

## 2020-02-06 LAB — GLUCOSE, CAPILLARY: Glucose-Capillary: 93 mg/dL (ref 70–99)

## 2020-02-06 NOTE — Progress Notes (Signed)
Progress Note  Patient Name: Jackie Parker Date of Encounter: 02/06/2020  Primary Cardiologist: Sinclair Grooms, MD   Subjective   No complaints continues to diurese Seen by renal yesterday   Inpatient Medications    Scheduled Meds: . amiodarone  200 mg Oral Daily  . brimonidine  1 drop Right Eye TID  . brinzolamide  1 drop Right Eye TID  . diltiazem  360 mg Oral Daily  . enoxaparin (LOVENOX) injection  40 mg Subcutaneous Q24H  . furosemide  60 mg Intravenous BID  . latanoprost  1 drop Both Eyes QHS  . medroxyPROGESTERone  20 mg Oral Daily  . mometasone-formoterol  2 puff Inhalation BID  . pneumococcal 23 valent vaccine  0.5 mL Intramuscular Tomorrow-1000  . potassium chloride  40 mEq Oral TID  . sodium chloride flush  3 mL Intravenous Q12H   Continuous Infusions: . sodium chloride     PRN Meds: sodium chloride, acetaminophen, albuterol, allopurinol, fluocinonide ointment, sodium chloride flush   Vital Signs    Vitals:   02/05/20 1927 02/05/20 2157 02/06/20 0133 02/06/20 0356  BP:  (!) 107/52  (!) 100/48  Pulse:  78  79  Resp:  17  17  Temp:  98.1 F (36.7 C)  98.6 F (37 C)  TempSrc:  Oral  Oral  SpO2: 98% 97%  100%  Weight:   113.1 kg   Height:        Intake/Output Summary (Last 24 hours) at 02/06/2020 0845 Last data filed at 02/06/2020 0600 Gross per 24 hour  Intake 720 ml  Output 2775 ml  Net -2055 ml   Last 3 Weights 02/06/2020 02/05/2020 02/04/2020  Weight (lbs) 249 lb 6.4 oz 250 lb 4.8 oz 228 lb 6.4 oz  Weight (kg) 113.127 kg 113.535 kg 103.602 kg      Telemetry    Normal sinus rhythm with rates in the 80's to 90's. - Personally Reviewed  ECG    No new ECG tracing today. - Personally Reviewed  Physical Exam   Affect appropriate Obese black female  HEENT: normal Neck supple with no adenopathy JVP normal no bruits no thyromegaly Lungs clear with no wheezing and good diaphragmatic motion Heart:  S1/S2 no murmur, no rub, gallop or  click PMI normal Abdomen: benighn, BS positve, no tenderness, no AAA no bruit.  No HSM or HJR Distal pulses intact with no bruits Plus 2 bilateral edema Neuro non-focal Skin warm and dry Post bilateral TKR;s    Labs    High Sensitivity Troponin:  No results for input(s): TROPONINIHS in the last 720 hours.    Chemistry Recent Labs  Lab 02/03/20 1844 02/03/20 1844 02/04/20 0500 02/05/20 0606 02/06/20 0556  NA 141   < > 141 141 142  K 4.8   < > 4.1 4.6 5.1  CL 108   < > 110 107 110  CO2 22   < > 24 23 25   GLUCOSE 100*   < > 80 80 89  BUN 19   < > 17 17 17   CREATININE 3.01*   < > 2.89* 2.93* 3.40*  CALCIUM 8.6*   < > 8.3* 8.4* 8.3*  PROT 8.0  --   --   --   --   ALBUMIN 2.8*  --   --   --   --   AST 42*  --   --   --   --   ALT 16  --   --   --   --  ALKPHOS 47  --   --   --   --   BILITOT 1.9*  --   --   --   --   GFRNONAA 14*   < > 15* 15* 12*  GFRAA 16*   < > 17* 17* 14*  ANIONGAP 11   < > 7 11 7    < > = values in this interval not displayed.     Hematology Recent Labs  Lab 02/03/20 1844  WBC 7.4  RBC 4.16  HGB 12.5  HCT 39.2  MCV 94.2  MCH 30.0  MCHC 31.9  RDW 16.9*  PLT 150    BNP Recent Labs  Lab 02/03/20 1844  BNP 222.9*     DDimer No results for input(s): DDIMER in the last 168 hours.   Radiology    ECHOCARDIOGRAM COMPLETE  Result Date: 02/05/2020    ECHOCARDIOGRAM REPORT   Patient Name:   Jackie Parker Date of Exam: 02/05/2020 Medical Rec #:  [id number]        Height:       62.0 in Accession #:    [id number]       Weight:       250.3 lb Date of Birth:  [birth year]        BSA:          2.103 m Patient Age:    79 years         BP:           127/66 mmHg Patient Gender: F                HR:           88 bpm. Exam Location:  Inpatient Procedure: 2D Echo, Cardiac Doppler and Color Doppler Indications:    CHF-Acute Diastolic 952.84 / X32.44  History:        Patient has prior history of Echocardiogram examinations, most                 recent  08/02/2019. CHF, Arrythmias:PVC, PAC and Atrial                 Fibrillation; Risk Factors:Sleep Apnea, Hypertension and                 Non-Smoker.  Sonographer:    Vickie Epley RDCS Referring Phys: [place]  1. Left ventricular ejection fraction, by estimation, is 60 to 65%. The left ventricle has normal function. The left ventricle has no regional wall motion abnormalities. Left ventricular diastolic function could not be evaluated.  2. Right ventricular systolic function is normal. The right ventricular size is normal. There is mildly elevated pulmonary artery systolic pressure. The estimated right ventricular systolic pressure is 01.0 mmHg.  3. The mitral valve is degenerative. Trivial mitral valve regurgitation. No evidence of mitral stenosis.  4. The aortic valve is tricuspid. Aortic valve regurgitation is not visualized. Mild aortic valve stenosis. Aortic valve area, by VTI measures 1.61 cm. Aortic valve mean gradient measures 12.6 mmHg. Aortic valve Vmax measures 2.53 m/s.  5. The inferior vena cava is dilated in size with >50% respiratory variability, suggesting right atrial pressure of 8 mmHg. Comparison(s): No significant change from prior study. EF ~60%. Mild to moderate RVSP 45 mmHG on this study. FINDINGS  Left Ventricle: Left ventricular ejection fraction, by estimation, is 60 to 65%. The left ventricle has normal function. The left ventricle has no regional wall motion abnormalities. The left ventricular internal cavity size  was normal in size. There is  no left ventricular hypertrophy. Left ventricular diastolic function could not be evaluated due to atrial fibrillation. Left ventricular diastolic function could not be evaluated. Right Ventricle: The right ventricular size is normal. No increase in right ventricular wall thickness. Right ventricular systolic function is normal. There is mildly elevated pulmonary artery systolic pressure. The tricuspid regurgitant velocity is  3.05  m/s, and with an assumed right atrial pressure of 8 mmHg, the estimated right ventricular systolic pressure is 18.8 mmHg. Left Atrium: Left atrial size was normal in size. Right Atrium: Right atrial size was normal in size. Pericardium: Trivial pericardial effusion is present. Mitral Valve: The mitral valve is degenerative in appearance. There is mild thickening of the mitral valve leaflet(s). There is mild calcification of the mitral valve leaflet(s). Mild mitral annular calcification. Trivial mitral valve regurgitation. No evidence of mitral valve stenosis. Tricuspid Valve: The tricuspid valve is grossly normal. Tricuspid valve regurgitation is mild . No evidence of tricuspid stenosis. Aortic Valve: The aortic valve is tricuspid. Aortic valve regurgitation is not visualized. Mild aortic stenosis is present. Aortic valve mean gradient measures 12.6 mmHg. Aortic valve peak gradient measures 25.7 mmHg. Aortic valve area, by VTI measures 1.61 cm. Pulmonic Valve: The pulmonic valve was grossly normal. Pulmonic valve regurgitation is trivial. No evidence of pulmonic stenosis. Aorta: The aortic root is normal in size and structure. Venous: The inferior vena cava is dilated in size with greater than 50% respiratory variability, suggesting right atrial pressure of 8 mmHg. IAS/Shunts: The atrial septum is grossly normal.  LEFT VENTRICLE PLAX 2D LVIDd:         5.46 cm LVIDs:         3.80 cm LV PW:         0.66 cm LV IVS:        0.64 cm LVOT diam:     2.00 cm LV SV:         71 LV SV Index:   34 LVOT Area:     3.14 cm  LV Volumes (MOD) LV vol d, MOD A2C: 110.0 ml LV vol d, MOD A4C: 112.0 ml LV vol s, MOD A2C: 50.9 ml LV vol s, MOD A4C: 44.3 ml LV SV MOD A2C:     59.1 ml LV SV MOD A4C:     112.0 ml LV SV MOD BP:      63.3 ml RIGHT VENTRICLE TAPSE (M-mode): 1.8 cm LEFT ATRIUM             Index       RIGHT ATRIUM           Index LA diam:        3.80 cm 1.81 cm/m  RA Area:     12.60 cm LA Vol (A2C):   37.9 ml 18.02 ml/m  RA Volume:   26.80 ml  12.74 ml/m LA Vol (A4C):   22.4 ml 10.65 ml/m LA Biplane Vol: 31.6 ml 15.03 ml/m  AORTIC VALVE AV Area (Vmax):    1.51 cm AV Area (Vmean):   1.76 cm AV Area (VTI):     1.61 cm AV Vmax:           253.48 cm/s AV Vmean:          166.083 cm/s AV VTI:            0.442 m AV Peak Grad:      25.7 mmHg AV Mean Grad:      12.6  mmHg LVOT Vmax:         122.00 cm/s LVOT Vmean:        93.300 cm/s LVOT VTI:          0.227 m LVOT/AV VTI ratio: 0.51  AORTA Ao Root diam: 2.70 cm MR Peak grad: 77.1 mmHg   TRICUSPID VALVE MR Vmax:      439.00 cm/s TR Peak grad:   37.2 mmHg                           TR Vmax:        305.00 cm/s                            SHUNTS                           Systemic VTI:  0.23 m                           Systemic Diam: 2.00 cm Eleonore Chiquito MD Electronically signed by Eleonore Chiquito MD Signature Date/Time: 02/05/2020/12:38:02 PM    Final     Cardiac Studies   Echocardiogram 08/02/2019: Impressions:  1. Left ventricular ejection fraction, by visual estimation, is 55 to  60%. The left ventricle has normal function. There is mildly increased  left ventricular hypertrophy.   2. The left ventricle has no regional wall motion abnormalities.   3. Global right ventricle has normal systolic function.The right  ventricular size is normal. No increase in right ventricular wall  thickness.   4. Left atrial size was normal.   5. Right atrial size was normal.   6. The mitral valve is normal in structure. Trace mitral valve  regurgitation. No evidence of mitral stenosis.   7. The tricuspid valve is normal in structure. Tricuspid valve  regurgitation is mild.   8. The aortic valve is normal in structure. Aortic valve regurgitation is  not visualized. Mild aortic valve sclerosis without stenosis.   9. There is Mild calcification of the aortic valve.  10. There is Mild thickening of the aortic valve.  11. The pulmonic valve was grossly normal. Pulmonic valve regurgitation is    not visualized.  12. Moderately elevated pulmonary artery systolic pressure.  13. The inferior vena cava is dilated in size with <50% respiratory  variability, suggesting right atrial pressure of 15 mmHg.  Patient Profile     79 y.o. female with a history of persistent atrial fibrillation on Amiodarone but no anticoagulation due to uterine bleeding, hypertension, chronic hypokalemia, asthma, ulcerative colitis s/p colectomy in 1977 who was directly admitted from the office on 02/03/2020 for acute on chronic diastolic dysfunction after being found to be massively volume overloaded on exam.  Assessment & Plan    Acute on Chronic Diastolic CHF - Patient presented with dyspnea on exertion, orthopnea, and slow 50lb weight gain over the last 6 months.  - BNP elevated at 222.  - Big issue is acute on chronic renal failure  - over 2L out yesterday will likely need to cut back iv diuresis in am   Persistent Atrial Fibrillation - Maintaining sinus rhythm.  - Continue Diltiazem 332m daily and PO Amiodarone 2044mdaily. - Not on chronic anticoagulation due to uterine bleeding.  Hypertension - BP relatively well controlled. - Continue Diltiazem as above.  CRF: -  Cr 3,4 J 5,1 seen by renal  - will have to see where Cr ends up after sufficient diuresis -fact that BNP only 222 suggests renal failure contributing to fluid overload more    Dysfunctional Uterine Bleeding - Patient reports that bleeding has resolved and notes Gynecologist recently told her that she can likely start anticoagulation soon.  For questions or updates, please contact [place] Please consult [email or web address] for contact info under        Signed, Jenkins Rouge, MD  02/06/2020, 8:45 AM

## 2020-02-06 NOTE — Progress Notes (Signed)
Jackie Parker Progress Note   Subjective:   I/Os yesterday 1.6 / 2.7 (UOP 1.6, stool 1.2L via ostomy ). Wt 115.2 > 113.5 > 113.1kg. BP last BP and this AM 100/40-50s.  She's had her ostomy 40years - says volume is at baseline.  Objective Vitals:   02/06/20 0133 02/06/20 0356 02/06/20 0847 02/06/20 0928  BP:  (!) 100/48  (!) 157/74  Pulse:  79 81 87  Resp:  17 18   Temp:  98.6 F (37 C)    TempSrc:  Oral    SpO2:  100% 97%   Weight: 113.1 kg     Height:       Physical Exam General: sitting at edge of bed, appears comfortable Heart: RRR, no rub Lungs: normal WOB, clear to bases Extremities: 2+ pitting tibial edema Skin hyperpigmented patched on LE from psoriasis Neuro nonfocal  Additional Objective Labs: Basic Metabolic Panel: Recent Labs  Lab 02/04/20 0500 02/05/20 0606 02/06/20 0556  NA 141 141 142  K 4.1 4.6 5.1  CL 110 107 110  CO2 24 23 25   GLUCOSE 80 80 89  BUN 17 17 17   CREATININE 2.89* 2.93* 3.40*  CALCIUM 8.3* 8.4* 8.3*   Liver Function Tests: Recent Labs  Lab 02/03/20 1844  AST 42*  ALT 16  ALKPHOS 47  BILITOT 1.9*  PROT 8.0  ALBUMIN 2.8*   No results for input(s): LIPASE, AMYLASE in the last 168 hours. CBC: Recent Labs  Lab 02/03/20 1844  WBC 7.4  NEUTROABS 3.7  HGB 12.5  HCT 39.2  MCV 94.2  PLT 150   Blood Culture    Component Value Date/Time   SDES URINE, CLEAN CATCH 08/01/2019 1144   SPECREQUEST  08/01/2019 1144    NONE Performed at [facility], [place] [address], [place], [place] [address]    CULT 70,000 COLONIES/mL KLEBSIELLA PNEUMONIAE (A) 08/01/2019 1144   REPTSTATUS 08/03/2019 FINAL 08/01/2019 1144    Cardiac Enzymes: No results for input(s): CKTOTAL, CKMB, CKMBINDEX, TROPONINI in the last 168 hours. CBG: No results for input(s): GLUCAP in the last 168 hours. Iron Studies: No results for input(s): IRON, TIBC, TRANSFERRIN, FERRITIN in the last 72  hours. @lablastinr3 @ Studies/Results: ECHOCARDIOGRAM COMPLETE  Result Date: 02/05/2020    ECHOCARDIOGRAM REPORT   Patient Name:   Jackie Parker Date of Exam: 02/05/2020 Medical Rec #:  [id number]        Height:       62.0 in Accession #:    [id number]       Weight:       250.3 lb Date of Birth:  [birth year]        BSA:          2.103 m Patient Age:    79 years         BP:           127/66 mmHg Patient Gender: F                HR:           88 bpm. Exam Location:  Inpatient Procedure: 2D Echo, Cardiac Doppler and Color Doppler Indications:    CHF-Acute Diastolic 099.83 / J82.50  History:        Patient has prior history of Echocardiogram examinations, most                 recent 08/02/2019. CHF, Arrythmias:PVC, PAC and Atrial  Fibrillation; Risk Factors:Sleep Apnea, Hypertension and                 Non-Smoker.  Sonographer:    Vickie Epley RDCS Referring Phys: [place]  1. Left ventricular ejection fraction, by estimation, is 60 to 65%. The left ventricle has normal function. The left ventricle has no regional wall motion abnormalities. Left ventricular diastolic function could not be evaluated.  2. Right ventricular systolic function is normal. The right ventricular size is normal. There is mildly elevated pulmonary artery systolic pressure. The estimated right ventricular systolic pressure is 31.5 mmHg.  3. The mitral valve is degenerative. Trivial mitral valve regurgitation. No evidence of mitral stenosis.  4. The aortic valve is tricuspid. Aortic valve regurgitation is not visualized. Mild aortic valve stenosis. Aortic valve area, by VTI measures 1.61 cm. Aortic valve mean gradient measures 12.6 mmHg. Aortic valve Vmax measures 2.53 m/s.  5. The inferior vena cava is dilated in size with >50% respiratory variability, suggesting right atrial pressure of 8 mmHg. Comparison(s): No significant change from prior study. EF ~60%. Mild to moderate RVSP 45 mmHG on this study.  FINDINGS  Left Ventricle: Left ventricular ejection fraction, by estimation, is 60 to 65%. The left ventricle has normal function. The left ventricle has no regional wall motion abnormalities. The left ventricular internal cavity size was normal in size. There is  no left ventricular hypertrophy. Left ventricular diastolic function could not be evaluated due to atrial fibrillation. Left ventricular diastolic function could not be evaluated. Right Ventricle: The right ventricular size is normal. No increase in right ventricular wall thickness. Right ventricular systolic function is normal. There is mildly elevated pulmonary artery systolic pressure. The tricuspid regurgitant velocity is 3.05  m/s, and with an assumed right atrial pressure of 8 mmHg, the estimated right ventricular systolic pressure is 40.0 mmHg. Left Atrium: Left atrial size was normal in size. Right Atrium: Right atrial size was normal in size. Pericardium: Trivial pericardial effusion is present. Mitral Valve: The mitral valve is degenerative in appearance. There is mild thickening of the mitral valve leaflet(s). There is mild calcification of the mitral valve leaflet(s). Mild mitral annular calcification. Trivial mitral valve regurgitation. No evidence of mitral valve stenosis. Tricuspid Valve: The tricuspid valve is grossly normal. Tricuspid valve regurgitation is mild . No evidence of tricuspid stenosis. Aortic Valve: The aortic valve is tricuspid. Aortic valve regurgitation is not visualized. Mild aortic stenosis is present. Aortic valve mean gradient measures 12.6 mmHg. Aortic valve peak gradient measures 25.7 mmHg. Aortic valve area, by VTI measures 1.61 cm. Pulmonic Valve: The pulmonic valve was grossly normal. Pulmonic valve regurgitation is trivial. No evidence of pulmonic stenosis. Aorta: The aortic root is normal in size and structure. Venous: The inferior vena cava is dilated in size with greater than 50% respiratory variability,  suggesting right atrial pressure of 8 mmHg. IAS/Shunts: The atrial septum is grossly normal.  LEFT VENTRICLE PLAX 2D LVIDd:         5.46 cm LVIDs:         3.80 cm LV PW:         0.66 cm LV IVS:        0.64 cm LVOT diam:     2.00 cm LV SV:         71 LV SV Index:   34 LVOT Area:     3.14 cm  LV Volumes (MOD) LV vol d, MOD A2C: 110.0 ml LV vol d, MOD  A4C: 112.0 ml LV vol s, MOD A2C: 50.9 ml LV vol s, MOD A4C: 44.3 ml LV SV MOD A2C:     59.1 ml LV SV MOD A4C:     112.0 ml LV SV MOD BP:      63.3 ml RIGHT VENTRICLE TAPSE (M-mode): 1.8 cm LEFT ATRIUM             Index       RIGHT ATRIUM           Index LA diam:        3.80 cm 1.81 cm/m  RA Area:     12.60 cm LA Vol (A2C):   37.9 ml 18.02 ml/m RA Volume:   26.80 ml  12.74 ml/m LA Vol (A4C):   22.4 ml 10.65 ml/m LA Biplane Vol: 31.6 ml 15.03 ml/m  AORTIC VALVE AV Area (Vmax):    1.51 cm AV Area (Vmean):   1.76 cm AV Area (VTI):     1.61 cm AV Vmax:           253.48 cm/s AV Vmean:          166.083 cm/s AV VTI:            0.442 m AV Peak Grad:      25.7 mmHg AV Mean Grad:      12.6 mmHg LVOT Vmax:         122.00 cm/s LVOT Vmean:        93.300 cm/s LVOT VTI:          0.227 m LVOT/AV VTI ratio: 0.51  AORTA Ao Root diam: 2.70 cm MR Peak grad: 77.1 mmHg   TRICUSPID VALVE MR Vmax:      439.00 cm/s TR Peak grad:   37.2 mmHg                           TR Vmax:        305.00 cm/s                            SHUNTS                           Systemic VTI:  0.23 m                           Systemic Diam: 2.00 cm Eleonore Chiquito MD Electronically signed by Eleonore Chiquito MD Signature Date/Time: 02/05/2020/12:38:02 PM    Final    Medications: . sodium chloride     . amiodarone  200 mg Oral Daily  . brimonidine  1 drop Right Eye TID  . brinzolamide  1 drop Right Eye TID  . diltiazem  360 mg Oral Daily  . enoxaparin (LOVENOX) injection  40 mg Subcutaneous Q24H  . furosemide  60 mg Intravenous BID  . latanoprost  1 drop Both Eyes QHS  . medroxyPROGESTERone  20 mg Oral Daily   . mometasone-formoterol  2 puff Inhalation BID  . pneumococcal 23 valent vaccine  0.5 mL Intramuscular Tomorrow-1000  . 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  She had  CT earlier this month with no obstruction and I don't think repeating is necessary.  She's in for obligatory diuresis  in setting of massive volume overload --> continue with diuresis, checking daily labs.  Will need to be slow and steady process --> I think it's ok to cont lasix 60 IV BID today but with Cr having small uptick today from 3 to 3.4 will need to be cautious.  Avoid hypotension (modest hypotension but not on antiHTN therapy except diltiazem which is for A fib rate control), nephrotoxins.     5/26 - Discussed her current GFR, CKD staging and what constitutes ESRD.  Hopefully we can avoid need for RRT this admission --> briefly introduced the concept and will go into more detail per progress.  Discussed 5/27 that no RRT needed at this time.  Will follow her in [facility] where she has not been seen before.   **Acute on chronic HFpEF:  Cardiology following; TTE 5/26 with normal EF, dilated IVC.  Needs continued diuresis and rate seems fine for now - cont lasix 60 IV, I/Os, na/fluid restrict, daily weight.   **A fib:  Per cardiology  **HTN: per above, avoid hypotension in setting of diuresis and AKI.    Jannifer Hick MD 02/06/2020, 9:34 AM  [facility] Pager: [phone]

## 2020-02-06 NOTE — Progress Notes (Signed)
RT placed pt on CPAP dream station in auto titrate w/settings of max 12 min 6 w/no oxygen bled into the system. Pts respiratory status is stable at this time, pt tolerating well. RT will continue to monitor.

## 2020-02-07 LAB — BASIC METABOLIC PANEL
Anion gap: 8 (ref 5–15)
BUN: 20 mg/dL (ref 8–23)
CO2: 23 mmol/L (ref 22–32)
Calcium: 8.2 mg/dL — ABNORMAL LOW (ref 8.9–10.3)
Chloride: 110 mmol/L (ref 98–111)
Creatinine, Ser: 3.18 mg/dL — ABNORMAL HIGH (ref 0.44–1.00)
GFR calc Af Amer: 15 mL/min — ABNORMAL LOW (ref >60)
GFR calc non Af Amer: 13 mL/min — ABNORMAL LOW (ref >60)
Glucose, Bld: 82 mg/dL (ref 70–99)
Potassium: 3.9 mmol/L (ref 3.5–5.1)
Sodium: 141 mmol/L (ref 135–145)

## 2020-02-07 LAB — GLUCOSE, CAPILLARY
Glucose-Capillary: 83 mg/dL (ref 70–99)
Glucose-Capillary: 101 mg/dL — ABNORMAL HIGH (ref 70–99)

## 2020-02-07 MED ORDER — SODIUM CHLORIDE 0.9% FLUSH
3.0000 mL | Freq: Two times a day (BID) | INTRAVENOUS | Status: DC
  Administered 2020-02-07 – 2020-02-14 (×13): 3 mL via INTRAVENOUS

## 2020-02-07 NOTE — Plan of Care (Signed)
  Problem: Education: Goal: Ability to demonstrate management of disease process will improve Outcome: Progressing Goal: Ability to verbalize understanding of medication therapies will improve Outcome: Progressing   Problem: Activity: Goal: Capacity to carry out activities will improve Outcome: Progressing   Problem: Cardiac: Goal: Ability to achieve and maintain adequate cardiopulmonary perfusion will improve Outcome: Progressing   

## 2020-02-07 NOTE — Progress Notes (Signed)
[facility] Progress Note   Subjective:   I/Os yesterday 1.4 UOP.  No changes in ostomy output.  Does have some shortness of breath but otherwise denies any concerns.  No dysuria.  Objective Vitals:   02/07/20 0414 02/07/20 0419 02/07/20 0858 02/07/20 1130  BP: 116/61   112/75  Pulse: 86  83 90  Resp: 18  18 16   Temp: 98.5 F (36.9 C)   98.2 F (36.8 C)  TempSrc: Oral   Oral  SpO2: 95%  99% 95%  Weight:  112.3 kg    Height:       Physical Exam General: Lying in bed, no distress Heart: Normal rate, no audible murmurs Lungs: normal WOB, bilateral chest rise Extremities: 2+ pitting tibial edema Skin warm and dry Neuro nonfocal  Additional Objective Labs: Basic Metabolic Panel: Recent Labs  Lab 02/05/20 0606 02/06/20 0556 02/07/20 0405  NA 141 142 141  K 4.6 5.1 3.9  CL 107 110 110  CO2 23 25 23   GLUCOSE 80 89 82  BUN 17 17 20   CREATININE 2.93* 3.40* 3.18*  CALCIUM 8.4* 8.3* 8.2*   Liver Function Tests: Recent Labs  Lab 02/03/20 1844  AST 42*  ALT 16  ALKPHOS 47  BILITOT 1.9*  PROT 8.0  ALBUMIN 2.8*   No results for input(s): LIPASE, AMYLASE in the last 168 hours. CBC: Recent Labs  Lab 02/03/20 1844  WBC 7.4  NEUTROABS 3.7  HGB 12.5  HCT 39.2  MCV 94.2  PLT 150   Blood Culture    Component Value Date/Time   SDES URINE, CLEAN CATCH 08/01/2019 1144   SPECREQUEST  08/01/2019 1144    NONE Performed at [facility], [place] [address], [place], [place] [address]    CULT 70,000 COLONIES/mL KLEBSIELLA PNEUMONIAE (A) 08/01/2019 1144   REPTSTATUS 08/03/2019 FINAL 08/01/2019 1144    Cardiac Enzymes: No results for input(s): CKTOTAL, CKMB, CKMBINDEX, TROPONINI in the last 168 hours. CBG: Recent Labs  Lab 02/06/20 2129 02/07/20 0606  GLUCAP 93 83   Iron Studies: No results for input(s): IRON, TIBC, TRANSFERRIN, FERRITIN in the last 72 hours.  Studies/Results: No results found. Medications: . sodium chloride     .  amiodarone  200 mg Oral Daily  . brimonidine  1 drop Right Eye TID  . brinzolamide  1 drop Right Eye TID  . diltiazem  360 mg Oral Daily  . enoxaparin (LOVENOX) injection  40 mg Subcutaneous Q24H  . furosemide  60 mg Intravenous BID  . latanoprost  1 drop Both Eyes QHS  . medroxyPROGESTERone  20 mg Oral Daily  . mometasone-formoterol  2 puff Inhalation BID  . sodium chloride flush  3 mL Intravenous Q12H  . 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    No signs of recent obstruction.  Requiring diuresis due to massive volume overload.  Fortunately creatinine stable today.  Would continue with ongoing diuresis slowly but steadily and avoiding hypotension as able.   5/26 - Discussed her current GFR, CKD staging and what constitutes ESRD.  Hopefully we can avoid need for RRT this admission --> briefly introduced the concept and will go into more detail per progress.  Discussed 5/27 that no RRT needed at this time.  Will follow her in [facility] where she has not been seen before.   Lasix IV 60 mg twice daily.  If creatinine is stable tomorrow likely could increase dose  **Acute on chronic HFpEF:  Cardiology following; TTE 5/26 with normal EF, dilated IVC.  Needs continued diuresis and rate seems fine for now - cont lasix 60 IV, I/Os, na/fluid restrict, daily weight.  Consider increasing dose tomorrow if creatinine is stable  **A fib:  Per cardiology  **HTN: per above, avoid hypotension in setting of diuresis and AKI.    **Pyuria: Present on urinalysis.  No symptoms of urinary tract infection.  Could consider treatment for UTI if dysuria or frequency become present

## 2020-02-07 NOTE — Progress Notes (Signed)
Progress Note  Patient Name: Jackie Parker Date of Encounter: 02/07/2020  Primary Cardiologist: Sinclair Grooms, MD   Subjective   Doing better less dyspnea, able to lay more flat legs less heavy  Inpatient Medications    Scheduled Meds: . amiodarone  200 mg Oral Daily  . brimonidine  1 drop Right Eye TID  . brinzolamide  1 drop Right Eye TID  . diltiazem  360 mg Oral Daily  . enoxaparin (LOVENOX) injection  40 mg Subcutaneous Q24H  . furosemide  60 mg Intravenous BID  . latanoprost  1 drop Both Eyes QHS  . medroxyPROGESTERone  20 mg Oral Daily  . mometasone-formoterol  2 puff Inhalation BID  . sodium chloride flush  3 mL Intravenous Q12H   Continuous Infusions: . sodium chloride     PRN Meds: sodium chloride, acetaminophen, albuterol, allopurinol, fluocinonide ointment, sodium chloride flush   Vital Signs    Vitals:   02/06/20 2100 02/07/20 0414 02/07/20 0419 02/07/20 0858  BP:  116/61    Pulse: 85 86  83  Resp: 16 18  18   Temp:  98.5 F (36.9 C)    TempSrc:  Oral    SpO2: 99% 95%  99%  Weight:   112.3 kg   Height:        Intake/Output Summary (Last 24 hours) at 02/07/2020 0914 Last data filed at 02/07/2020 0411 Gross per 24 hour  Intake 240 ml  Output 1400 ml  Net -1160 ml   Last 3 Weights 02/07/2020 02/06/2020 02/05/2020  Weight (lbs) 247 lb 9.6 oz 249 lb 6.4 oz 250 lb 4.8 oz  Weight (kg) 112.311 kg 113.127 kg 113.535 kg      Telemetry    Normal sinus rhythm with rates in the 80's to 90's. - Personally Reviewed  ECG    No new ECG tracing today. - Personally Reviewed  Physical Exam   Affect appropriate Obese black female  HEENT: normal Neck supple with no adenopathy JVP normal no bruits no thyromegaly Lungs clear with no wheezing and good diaphragmatic motion Heart:  S1/S2 no murmur, no rub, gallop or click PMI normal Abdomen: benighn, BS positve, no tenderness, no AAA no bruit.  No HSM or HJR Distal pulses intact with no bruits Plus  2 bilateral edema Neuro non-focal Skin warm and dry Post bilateral TKR;s    Labs    High Sensitivity Troponin:  No results for input(s): TROPONINIHS in the last 720 hours.    Chemistry Recent Labs  Lab 02/03/20 1844 02/04/20 0500 02/05/20 0606 02/06/20 0556 02/07/20 0405  NA 141   < > 141 142 141  K 4.8   < > 4.6 5.1 3.9  CL 108   < > 107 110 110  CO2 22   < > 23 25 23   GLUCOSE 100*   < > 80 89 82  BUN 19   < > 17 17 20   CREATININE 3.01*   < > 2.93* 3.40* 3.18*  CALCIUM 8.6*   < > 8.4* 8.3* 8.2*  PROT 8.0  --   --   --   --   ALBUMIN 2.8*  --   --   --   --   AST 42*  --   --   --   --   ALT 16  --   --   --   --   ALKPHOS 47  --   --   --   --   BILITOT  1.9*  --   --   --   --   GFRNONAA 14*   < > 15* 12* 13*  GFRAA 16*   < > 17* 14* 15*  ANIONGAP 11   < > 11 7 8    < > = values in this interval not displayed.     Hematology Recent Labs  Lab 02/03/20 1844  WBC 7.4  RBC 4.16  HGB 12.5  HCT 39.2  MCV 94.2  MCH 30.0  MCHC 31.9  RDW 16.9*  PLT 150    BNP Recent Labs  Lab 02/03/20 1844  BNP 222.9*     DDimer No results for input(s): DDIMER in the last 168 hours.   Radiology    ECHOCARDIOGRAM COMPLETE  Result Date: 02/05/2020    ECHOCARDIOGRAM REPORT   Patient Name:   Jackie Parker Date of Exam: 02/05/2020 Medical Rec #:  [id number]        Height:       62.0 in Accession #:    [id number]       Weight:       250.3 lb Date of Birth:  [birth year]        BSA:          2.103 m Patient Age:    79 years         BP:           127/66 mmHg Patient Gender: F                HR:           88 bpm. Exam Location:  Inpatient Procedure: 2D Echo, Cardiac Doppler and Color Doppler Indications:    CHF-Acute Diastolic 607.37 / T06.26  History:        Patient has prior history of Echocardiogram examinations, most                 recent 08/02/2019. CHF, Arrythmias:PVC, PAC and Atrial                 Fibrillation; Risk Factors:Sleep Apnea, Hypertension and                  Non-Smoker.  Sonographer:    Vickie Epley RDCS Referring Phys: [place]  1. Left ventricular ejection fraction, by estimation, is 60 to 65%. The left ventricle has normal function. The left ventricle has no regional wall motion abnormalities. Left ventricular diastolic function could not be evaluated.  2. Right ventricular systolic function is normal. The right ventricular size is normal. There is mildly elevated pulmonary artery systolic pressure. The estimated right ventricular systolic pressure is 94.8 mmHg.  3. The mitral valve is degenerative. Trivial mitral valve regurgitation. No evidence of mitral stenosis.  4. The aortic valve is tricuspid. Aortic valve regurgitation is not visualized. Mild aortic valve stenosis. Aortic valve area, by VTI measures 1.61 cm. Aortic valve mean gradient measures 12.6 mmHg. Aortic valve Vmax measures 2.53 m/s.  5. The inferior vena cava is dilated in size with >50% respiratory variability, suggesting right atrial pressure of 8 mmHg. Comparison(s): No significant change from prior study. EF ~60%. Mild to moderate RVSP 45 mmHG on this study. FINDINGS  Left Ventricle: Left ventricular ejection fraction, by estimation, is 60 to 65%. The left ventricle has normal function. The left ventricle has no regional wall motion abnormalities. The left ventricular internal cavity size was normal in size. There is  no left ventricular hypertrophy. Left ventricular diastolic function could  not be evaluated due to atrial fibrillation. Left ventricular diastolic function could not be evaluated. Right Ventricle: The right ventricular size is normal. No increase in right ventricular wall thickness. Right ventricular systolic function is normal. There is mildly elevated pulmonary artery systolic pressure. The tricuspid regurgitant velocity is 3.05  m/s, and with an assumed right atrial pressure of 8 mmHg, the estimated right ventricular systolic pressure is 44.9 mmHg. Left  Atrium: Left atrial size was normal in size. Right Atrium: Right atrial size was normal in size. Pericardium: Trivial pericardial effusion is present. Mitral Valve: The mitral valve is degenerative in appearance. There is mild thickening of the mitral valve leaflet(s). There is mild calcification of the mitral valve leaflet(s). Mild mitral annular calcification. Trivial mitral valve regurgitation. No evidence of mitral valve stenosis. Tricuspid Valve: The tricuspid valve is grossly normal. Tricuspid valve regurgitation is mild . No evidence of tricuspid stenosis. Aortic Valve: The aortic valve is tricuspid. Aortic valve regurgitation is not visualized. Mild aortic stenosis is present. Aortic valve mean gradient measures 12.6 mmHg. Aortic valve peak gradient measures 25.7 mmHg. Aortic valve area, by VTI measures 1.61 cm. Pulmonic Valve: The pulmonic valve was grossly normal. Pulmonic valve regurgitation is trivial. No evidence of pulmonic stenosis. Aorta: The aortic root is normal in size and structure. Venous: The inferior vena cava is dilated in size with greater than 50% respiratory variability, suggesting right atrial pressure of 8 mmHg. IAS/Shunts: The atrial septum is grossly normal.  LEFT VENTRICLE PLAX 2D LVIDd:         5.46 cm LVIDs:         3.80 cm LV PW:         0.66 cm LV IVS:        0.64 cm LVOT diam:     2.00 cm LV SV:         71 LV SV Index:   34 LVOT Area:     3.14 cm  LV Volumes (MOD) LV vol d, MOD A2C: 110.0 ml LV vol d, MOD A4C: 112.0 ml LV vol s, MOD A2C: 50.9 ml LV vol s, MOD A4C: 44.3 ml LV SV MOD A2C:     59.1 ml LV SV MOD A4C:     112.0 ml LV SV MOD BP:      63.3 ml RIGHT VENTRICLE TAPSE (M-mode): 1.8 cm LEFT ATRIUM             Index       RIGHT ATRIUM           Index LA diam:        3.80 cm 1.81 cm/m  RA Area:     12.60 cm LA Vol (A2C):   37.9 ml 18.02 ml/m RA Volume:   26.80 ml  12.74 ml/m LA Vol (A4C):   22.4 ml 10.65 ml/m LA Biplane Vol: 31.6 ml 15.03 ml/m  AORTIC VALVE AV Area  (Vmax):    1.51 cm AV Area (Vmean):   1.76 cm AV Area (VTI):     1.61 cm AV Vmax:           253.48 cm/s AV Vmean:          166.083 cm/s AV VTI:            0.442 m AV Peak Grad:      25.7 mmHg AV Mean Grad:      12.6 mmHg LVOT Vmax:         122.00 cm/s LVOT Vmean:  93.300 cm/s LVOT VTI:          0.227 m LVOT/AV VTI ratio: 0.51  AORTA Ao Root diam: 2.70 cm MR Peak grad: 77.1 mmHg   TRICUSPID VALVE MR Vmax:      439.00 cm/s TR Peak grad:   37.2 mmHg                           TR Vmax:        305.00 cm/s                            SHUNTS                           Systemic VTI:  0.23 m                           Systemic Diam: 2.00 cm Eleonore Chiquito MD Electronically signed by Eleonore Chiquito MD Signature Date/Time: 02/05/2020/12:38:02 PM    Final     Cardiac Studies   Echocardiogram 08/02/2019: Impressions:  1. Left ventricular ejection fraction, by visual estimation, is 55 to  60%. The left ventricle has normal function. There is mildly increased  left ventricular hypertrophy.   2. The left ventricle has no regional wall motion abnormalities.   3. Global right ventricle has normal systolic function.The right  ventricular size is normal. No increase in right ventricular wall  thickness.   4. Left atrial size was normal.   5. Right atrial size was normal.   6. The mitral valve is normal in structure. Trace mitral valve  regurgitation. No evidence of mitral stenosis.   7. The tricuspid valve is normal in structure. Tricuspid valve  regurgitation is mild.   8. The aortic valve is normal in structure. Aortic valve regurgitation is  not visualized. Mild aortic valve sclerosis without stenosis.   9. There is Mild calcification of the aortic valve.  10. There is Mild thickening of the aortic valve.  11. The pulmonic valve was grossly normal. Pulmonic valve regurgitation is  not visualized.  12. Moderately elevated pulmonary artery systolic pressure.  13. The inferior vena cava is dilated in size  with <50% respiratory  variability, suggesting right atrial pressure of 15 mmHg.  Patient Profile     79 y.o. female with a history of persistent atrial fibrillation on Amiodarone but no anticoagulation due to uterine bleeding, hypertension, chronic hypokalemia, asthma, ulcerative colitis s/p colectomy in 1977 who was directly admitted from the office on 02/03/2020 for acute on chronic diastolic dysfunction after being found to be massively volume overloaded on exam.  Assessment & Plan    Acute on Chronic Diastolic CHF - Patient presented with dyspnea on exertion, orthopnea, and slow 50lb weight gain over the last 6 months.  - BNP elevated at 222.  - Big issue is acute on chronic renal failure  - over 1.1 L out yesterday Cr stable continue iv diuresis over weekend Have tentatively arranged right heart cath with [facility] On Tuesday since Monday is a holiday   Persistent Atrial Fibrillation - Maintaining sinus rhythm.  - Continue Diltiazem 320m daily and PO Amiodarone 2067mdaily. - Not on chronic anticoagulation due to uterine bleeding.  Hypertension - BP relatively well controlled. - Continue Diltiazem as above.  CRF: - Cr 3.1 this am stable  - will have  to see where Cr ends up after sufficient diuresis -fact that BNP only 222 suggests renal failure contributing to fluid overload more    Dysfunctional Uterine Bleeding - Patient reports that bleeding has resolved and notes Gynecologist recently told her that she can likely start anticoagulation soon.  For questions or updates, please contact [place] Please consult [email or web address] for contact info under        Signed, Jenkins Rouge, MD  02/07/2020, 9:14 AM

## 2020-02-08 DIAGNOSIS — N179 Acute kidney failure, unspecified: Secondary | ICD-10-CM

## 2020-02-08 LAB — BASIC METABOLIC PANEL
Anion gap: 12 (ref 5–15)
BUN: 17 mg/dL (ref 8–23)
CO2: 23 mmol/L (ref 22–32)
Calcium: 8.3 mg/dL — ABNORMAL LOW (ref 8.9–10.3)
Chloride: 108 mmol/L (ref 98–111)
Creatinine, Ser: 3.07 mg/dL — ABNORMAL HIGH (ref 0.44–1.00)
GFR calc Af Amer: 16 mL/min — ABNORMAL LOW (ref >60)
GFR calc non Af Amer: 14 mL/min — ABNORMAL LOW (ref >60)
Glucose, Bld: 131 mg/dL — ABNORMAL HIGH (ref 70–99)
Potassium: 3.6 mmol/L (ref 3.5–5.1)
Sodium: 143 mmol/L (ref 135–145)

## 2020-02-08 LAB — GLUCOSE, CAPILLARY
Glucose-Capillary: 78 mg/dL (ref 70–99)
Glucose-Capillary: 106 mg/dL — ABNORMAL HIGH (ref 70–99)
Glucose-Capillary: 99 mg/dL (ref 70–99)

## 2020-02-08 MED ORDER — FUROSEMIDE 10 MG/ML IJ SOLN
120.0000 mg | Freq: Two times a day (BID) | INTRAVENOUS | Status: DC
  Administered 2020-02-08 (×2): 120 mg via INTRAVENOUS
  Filled 2020-02-08: qty 10
  Filled 2020-02-08: qty 4
  Filled 2020-02-08: qty 12

## 2020-02-08 NOTE — Progress Notes (Signed)
Patient declines to wear CPAP tonight.

## 2020-02-08 NOTE — Progress Notes (Signed)
Cardiology Progress Note  Patient ID: Jackie Parker MRN: [id number] DOB: 1940-09-21 Date of Encounter: 02/08/2020  Primary Cardiologist: Sinclair Grooms, MD  Subjective   Chief Complaint: SOB  HPI: Still SOB and not back to baseline. Denies CP. Still quite winded with walking. Back in Afib.   ROS:  All other ROS reviewed and negative. Pertinent positives noted in the HPI.     Inpatient Medications  Scheduled Meds: . amiodarone  200 mg Oral Daily  . brimonidine  1 drop Right Eye TID  . brinzolamide  1 drop Right Eye TID  . diltiazem  360 mg Oral Daily  . enoxaparin (LOVENOX) injection  40 mg Subcutaneous Q24H  . furosemide  60 mg Intravenous BID  . latanoprost  1 drop Both Eyes QHS  . medroxyPROGESTERone  20 mg Oral Daily  . mometasone-formoterol  2 puff Inhalation BID  . sodium chloride flush  3 mL Intravenous Q12H  . sodium chloride flush  3 mL Intravenous Q12H   Continuous Infusions: . sodium chloride     PRN Meds: sodium chloride, acetaminophen, albuterol, allopurinol, fluocinonide ointment, sodium chloride flush   Vital Signs   Vitals:   02/07/20 2011 02/07/20 2236 02/08/20 0525 02/08/20 0529  BP: 114/60  (!) 109/54   Pulse: 78 84 82   Resp: 17 18 18    Temp: 98.4 F (36.9 C)  98.7 F (37.1 C)   TempSrc: Oral  Oral   SpO2: 99% 94% 100%   Weight:    111.9 kg  Height:        Intake/Output Summary (Last 24 hours) at 02/08/2020 0751 Last data filed at 02/08/2020 0537 Gross per 24 hour  Intake 560 ml  Output 2100 ml  Net -1540 ml   Last 3 Weights 02/08/2020 02/07/2020 02/06/2020  Weight (lbs) 246 lb 11.2 oz 247 lb 9.6 oz 249 lb 6.4 oz  Weight (kg) 111.902 kg 112.311 kg 113.127 kg      Telemetry  Overnight telemetry shows Afib 90s, which I personally reviewed.   ECG  The most recent ECG shows SR 95 bpm, which I personally reviewed.   Physical Exam   Vitals:   02/07/20 2011 02/07/20 2236 02/08/20 0525 02/08/20 0529  BP: 114/60  (!) 109/54     Pulse: 78 84 82   Resp: 17 18 18    Temp: 98.4 F (36.9 C)  98.7 F (37.1 C)   TempSrc: Oral  Oral   SpO2: 99% 94% 100%   Weight:    111.9 kg  Height:         Intake/Output Summary (Last 24 hours) at 02/08/2020 0751 Last data filed at 02/08/2020 0537 Gross per 24 hour  Intake 560 ml  Output 2100 ml  Net -1540 ml    Last 3 Weights 02/08/2020 02/07/2020 02/06/2020  Weight (lbs) 246 lb 11.2 oz 247 lb 9.6 oz 249 lb 6.4 oz  Weight (kg) 111.902 kg 112.311 kg 113.127 kg    Body mass index is 45.12 kg/m.  General: Well nourished, well developed, in no acute distress Head: Atraumatic, normal size  Eyes: PEERLA, EOMI  Neck: Supple, JVD 12-15 cm H20 Endocrine: No thryomegaly Cardiac: Normal S1, S2; irregular rhythm, no m/r/g Lungs: rales bilaterally  Abd: Soft, nontender, no hepatomegaly  Ext: 2+ pitting edema, venous insufficiency changes  Musculoskeletal: No deformities, BUE and BLE strength normal and equal Skin: Warm and dry, no rashes   Neuro: Alert and oriented to person, place, time, and situation, CNII-XII grossly intact,  no focal deficits  Psych: Normal mood and affect   Labs  High Sensitivity Troponin:  No results for input(s): TROPONINIHS in the last 720 hours.   Cardiac EnzymesNo results for input(s): TROPONINI in the last 168 hours. No results for input(s): TROPIPOC in the last 168 hours.  Chemistry Recent Labs  Lab 02/03/20 1844 02/04/20 0500 02/05/20 0606 02/06/20 0556 02/07/20 0405  NA 141   < > 141 142 141  K 4.8   < > 4.6 5.1 3.9  CL 108   < > 107 110 110  CO2 22   < > 23 25 23   GLUCOSE 100*   < > 80 89 82  BUN 19   < > 17 17 20   CREATININE 3.01*   < > 2.93* 3.40* 3.18*  CALCIUM 8.6*   < > 8.4* 8.3* 8.2*  PROT 8.0  --   --   --   --   ALBUMIN 2.8*  --   --   --   --   AST 42*  --   --   --   --   ALT 16  --   --   --   --   ALKPHOS 47  --   --   --   --   BILITOT 1.9*  --   --   --   --   GFRNONAA 14*   < > 15* 12* 13*  GFRAA 16*   < > 17* 14* 15*   ANIONGAP 11   < > 11 7 8    < > = values in this interval not displayed.    Hematology Recent Labs  Lab 02/03/20 1844  WBC 7.4  RBC 4.16  HGB 12.5  HCT 39.2  MCV 94.2  MCH 30.0  MCHC 31.9  RDW 16.9*  PLT 150   BNP Recent Labs  Lab 02/03/20 1844  BNP 222.9*    DDimer No results for input(s): DDIMER in the last 168 hours.   Radiology  No results found.  Cardiac Studies  TTE 02/05/2020 1. Left ventricular ejection fraction, by estimation, is 60 to 65%. The  left ventricle has normal function. The left ventricle has no regional  wall motion abnormalities. Left ventricular diastolic function could not  be evaluated.  2. Right ventricular systolic function is normal. The right ventricular  size is normal. There is mildly elevated pulmonary artery systolic  pressure. The estimated right ventricular systolic pressure is 95.2 mmHg.  3. The mitral valve is degenerative. Trivial mitral valve regurgitation.  No evidence of mitral stenosis.  4. The aortic valve is tricuspid. Aortic valve regurgitation is not  visualized. Mild aortic valve stenosis. Aortic valve area, by VTI measures  1.61 cm. Aortic valve mean gradient measures 12.6 mmHg. Aortic valve Vmax  measures 2.53 m/s.  5. The inferior vena cava is dilated in size with >50% respiratory  variability, suggesting right atrial pressure of 8 mmHg.   Patient Profile  Teola Felipe is a 79 y.o. female with diastolic HF, CKD, persistent Afib, asthma, obesity (BMI 45) admitted 02/03/2020 for acute diastolic HF.   Assessment & Plan   1. Acute on Chronic Diastolic HF -net negative 6.7 L since admission. Down 7.2 lbs since admission -continue with aggressive diuresis. Cr 3.2 yesterday and pending.  -increase lasix to 120 mg BID; needs more aggressive diuresis -plan for RHC 6/1 but volume up still  2. Persistent Afib -back in Afib. Continue diltiazem for rate control. On amio. May convert.  Will continue -no AC due to  uterine bleeding   3. Acute Kidney injury/CKD -baseline Cr 2.1-2.4 -up to 3.2 with CHF -diuresis as above -strict I/Os -caution with nephrotoxic agents   4. HTN -stable  5. Uterine bleeding -no signs of bleeding   FEN - no IVF - salt restricted diet - dvt ppx: lovenox - code: full    For questions or updates, please contact [place] Please consult [email or web address] for contact info under   Time Spent with Patient: I have spent a total of 25 minutes with patient reviewing hospital notes, telemetry, EKGs, labs and examining the patient as well as establishing an assessment and plan that was discussed with the patient.  > 50% of time was spent in direct patient care.    Signed, Addison Naegeli. Audie Box, [place]  02/08/2020 7:51 AM

## 2020-02-08 NOTE — Progress Notes (Addendum)
[facility] Progress Note   Subjective:   I/Os yesterday 1.5 UOP +2 unmeasured.  Ostomy output largely unchanged.  Patient feels well today without any specific complaints.  Objective Vitals:   02/08/20 0811 02/08/20 0845 02/08/20 1021 02/08/20 1126  BP: (!) 149/70  (!) 116/47 (!) 103/50  Pulse: 94  83 85  Resp:    20  Temp: 98 F (36.7 C)  98.3 F (36.8 C) 98.5 F (36.9 C)  TempSrc: Oral  Oral Oral  SpO2: 92% 90% 100% 96%  Weight:      Height:       Physical Exam General: Lying in bed, no distress Heart: Normal rate, no audible murmurs Lungs: normal WOB, bilateral chest rise Extremities: 2+ pitting tibial edema Skin warm and dry Neuro nonfocal  Additional Objective Labs: Basic Metabolic Panel: Recent Labs  Lab 02/06/20 0556 02/07/20 0405 02/08/20 0819  NA 142 141 143  K 5.1 3.9 3.6  CL 110 110 108  CO2 25 23 23   GLUCOSE 89 82 131*  BUN 17 20 17   CREATININE 3.40* 3.18* 3.07*  CALCIUM 8.3* 8.2* 8.3*   Liver Function Tests: Recent Labs  Lab 02/03/20 1844  AST 42*  ALT 16  ALKPHOS 47  BILITOT 1.9*  PROT 8.0  ALBUMIN 2.8*   No results for input(s): LIPASE, AMYLASE in the last 168 hours. CBC: Recent Labs  Lab 02/03/20 1844  WBC 7.4  NEUTROABS 3.7  HGB 12.5  HCT 39.2  MCV 94.2  PLT 150   Blood Culture    Component Value Date/Time   SDES URINE, CLEAN CATCH 08/01/2019 1144   SPECREQUEST  08/01/2019 1144    NONE Performed at [facility], [place] [address] [place], [place] [address]    CULT 70,000 COLONIES/mL KLEBSIELLA PNEUMONIAE (A) 08/01/2019 1144   REPTSTATUS 08/03/2019 FINAL 08/01/2019 1144    Cardiac Enzymes: No results for input(s): CKTOTAL, CKMB, CKMBINDEX, TROPONINI in the last 168 hours. CBG: Recent Labs  Lab 02/06/20 2129 02/07/20 0606 02/07/20 2059 02/08/20 0621 02/08/20 1123  GLUCAP 93 83 101* 78 106*   Iron Studies: No results for input(s): IRON, TIBC, TRANSFERRIN, FERRITIN in the last 72  hours.  Studies/Results: No results found. Medications: . sodium chloride    . furosemide     . amiodarone  200 mg Oral Daily  . brimonidine  1 drop Right Eye TID  . brinzolamide  1 drop Right Eye TID  . diltiazem  360 mg Oral Daily  . enoxaparin (LOVENOX) injection  40 mg Subcutaneous Q24H  . latanoprost  1 drop Both Eyes QHS  . medroxyPROGESTERone  20 mg Oral Daily  . mometasone-formoterol  2 puff Inhalation BID  . sodium chloride flush  3 mL Intravenous Q12H  . 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    No signs of recent obstruction.  Crt now improving. Requiring diuresis due to massive volume overload.  Possibly cardiorenal syndrome.  Agree with cardiology that ongoing diuresis as needed.  Increasing Lasix to 120 mg IV and plans for right heart cath on 6/1.  [place] cardiology assistance  5/26 - Discussed her current GFR, CKD staging and what constitutes ESRD.  Hopefully we can avoid need for RRT this admission --> briefly introduced the concept and will go into more detail per progress.  Discussed 5/27 that no RRT needed at this time.  Will follow her in [place] clinic where she has not been seen before.   **Acute on chronic HFpEF:  Cardiology following; TTE 5/26 with normal EF, dilated IVC.  Needs continued diuresis and rate seems fine for now - cont lasix 120 IV, I/Os, na/fluid restrict, daily weight.  Agree with increasing diuretics per cardiology.  **A fib:  Per cardiology  **HTN: per above, avoid hypotension in setting of diuresis and AKI.    **Pyuria: Present on urinalysis.  No symptoms of urinary tract infection.  Could consider treatment for UTI if dysuria or frequency become present

## 2020-02-09 DIAGNOSIS — E876 Hypokalemia: Secondary | ICD-10-CM

## 2020-02-09 LAB — GLUCOSE, CAPILLARY
Glucose-Capillary: 82 mg/dL (ref 70–99)
Glucose-Capillary: 112 mg/dL — ABNORMAL HIGH (ref 70–99)
Glucose-Capillary: 146 mg/dL — ABNORMAL HIGH (ref 70–99)
Glucose-Capillary: 115 mg/dL — ABNORMAL HIGH (ref 70–99)

## 2020-02-09 LAB — BASIC METABOLIC PANEL
Anion gap: 7 (ref 5–15)
BUN: 18 mg/dL (ref 8–23)
CO2: 27 mmol/L (ref 22–32)
Calcium: 7.9 mg/dL — ABNORMAL LOW (ref 8.9–10.3)
Chloride: 107 mmol/L (ref 98–111)
Creatinine, Ser: 2.85 mg/dL — ABNORMAL HIGH (ref 0.44–1.00)
GFR calc Af Amer: 18 mL/min — ABNORMAL LOW (ref >60)
GFR calc non Af Amer: 15 mL/min — ABNORMAL LOW (ref >60)
Glucose, Bld: 83 mg/dL (ref 70–99)
Potassium: 3.2 mmol/L — ABNORMAL LOW (ref 3.5–5.1)
Sodium: 141 mmol/L (ref 135–145)

## 2020-02-09 MED ORDER — POTASSIUM CHLORIDE CRYS ER 20 MEQ PO TBCR: 40.0000 meq | EXTENDED_RELEASE_TABLET | Freq: Two times a day (BID) | ORAL | Status: DC

## 2020-02-09 MED ORDER — HEPARIN SODIUM (PORCINE) 5000 UNIT/ML IJ SOLN
5000.0000 [IU] | Freq: Three times a day (TID) | INTRAMUSCULAR | Status: DC
  Administered 2020-02-09 – 2020-02-11 (×5): 5000 [IU] via SUBCUTANEOUS
  Filled 2020-02-09 (×6): qty 1

## 2020-02-09 MED ORDER — POTASSIUM CHLORIDE CRYS ER 20 MEQ PO TBCR
40.0000 meq | EXTENDED_RELEASE_TABLET | Freq: Three times a day (TID) | ORAL | Status: AC
  Administered 2020-02-09 (×3): 40 meq via ORAL
  Filled 2020-02-09 (×3): qty 2

## 2020-02-09 MED ORDER — FUROSEMIDE 10 MG/ML IJ SOLN
160.0000 mg | Freq: Two times a day (BID) | INTRAVENOUS | Status: AC
  Administered 2020-02-09 (×2): 160 mg via INTRAVENOUS
  Filled 2020-02-09 (×2): qty 16

## 2020-02-09 MED ORDER — FUROSEMIDE 10 MG/ML IJ SOLN: 160.0000 mg | Freq: Two times a day (BID) | INTRAVENOUS | Status: DC

## 2020-02-09 MED ORDER — METOLAZONE 5 MG PO TABS
5.0000 mg | ORAL_TABLET | Freq: Once | ORAL | Status: AC
  Administered 2020-02-09: 5 mg via ORAL
  Filled 2020-02-09: qty 1

## 2020-02-09 NOTE — Progress Notes (Signed)
Patient refused CPAP for the night. Patients Sp02=95% at this time.

## 2020-02-09 NOTE — Progress Notes (Signed)
Cardiology Progress Note  Patient ID: Jackie Parker MRN: [id number] DOB: 09-Mar-1941 Date of Encounter: 02/09/2020  Primary Cardiologist: Sinclair Grooms, MD  Subjective   Chief Complaint: SOB  HPI: Still SOB. No back to baseline.   ROS:  All other ROS reviewed and negative. Pertinent positives noted in the HPI.     Inpatient Medications  Scheduled Meds: . amiodarone  200 mg Oral Daily  . brimonidine  1 drop Right Eye TID  . brinzolamide  1 drop Right Eye TID  . diltiazem  360 mg Oral Daily  . enoxaparin (LOVENOX) injection  40 mg Subcutaneous Q24H  . latanoprost  1 drop Both Eyes QHS  . medroxyPROGESTERone  20 mg Oral Daily  . metolazone  5 mg Oral Once  . mometasone-formoterol  2 puff Inhalation BID  . potassium chloride  40 mEq Oral TID  . sodium chloride flush  3 mL Intravenous Q12H  . sodium chloride flush  3 mL Intravenous Q12H   Continuous Infusions: . sodium chloride    . furosemide     PRN Meds: sodium chloride, acetaminophen, albuterol, allopurinol, fluocinonide ointment, sodium chloride flush   Vital Signs   Vitals:   02/08/20 2037 02/08/20 2056 02/09/20 0344 02/09/20 0855  BP:  (!) 108/51 114/64 134/62  Pulse: 78 78 83 85  Resp: 20 20 20 18   Temp:  98.2 F (36.8 C) 98.4 F (36.9 C) 98.2 F (36.8 C)  TempSrc:  Oral Oral Oral  SpO2: 96% 99% 95% 100%  Weight:   112.3 kg   Height:        Intake/Output Summary (Last 24 hours) at 02/09/2020 0917 Last data filed at 02/09/2020 0356 Gross per 24 hour  Intake 604 ml  Output 1625 ml  Net -1021 ml   Last 3 Weights 02/09/2020 02/08/2020 02/07/2020  Weight (lbs) 247 lb 9.6 oz 246 lb 11.2 oz 247 lb 9.6 oz  Weight (kg) 112.311 kg 111.902 kg 112.311 kg      Telemetry  Overnight telemetry shows SR 80s, which I personally reviewed.   Physical Exam   Vitals:   02/08/20 2037 02/08/20 2056 02/09/20 0344 02/09/20 0855  BP:  (!) 108/51 114/64 134/62  Pulse: 78 78 83 85  Resp: 20 20 20 18   Temp:  98.2 F  (36.8 C) 98.4 F (36.9 C) 98.2 F (36.8 C)  TempSrc:  Oral Oral Oral  SpO2: 96% 99% 95% 100%  Weight:   112.3 kg   Height:         Intake/Output Summary (Last 24 hours) at 02/09/2020 0917 Last data filed at 02/09/2020 0356 Gross per 24 hour  Intake 604 ml  Output 1625 ml  Net -1021 ml    Last 3 Weights 02/09/2020 02/08/2020 02/07/2020  Weight (lbs) 247 lb 9.6 oz 246 lb 11.2 oz 247 lb 9.6 oz  Weight (kg) 112.311 kg 111.902 kg 112.311 kg    Body mass index is 45.29 kg/m.   General: Well nourished, well developed Head: Atraumatic, normal size  Eyes: PEERLA, EOMI  Neck: Supple, +JVD Endocrine: No thryomegaly Cardiac: Normal S1, S2; RRR; no murmurs, rubs, or gallops Lungs: rales Abd: Soft, nontender, no hepatomegaly  Ext: 2+ pitting edema  Musculoskeletal: No deformities, BUE and BLE strength normal and equal Skin: Warm and dry, no rashes   Neuro: Alert and oriented to person, place, time, and situation, CNII-XII grossly intact, no focal deficits  Psych: Normal mood and affect   Labs  High Sensitivity Troponin:  No results for input(s): TROPONINIHS in the last 720 hours.   Cardiac EnzymesNo results for input(s): TROPONINI in the last 168 hours. No results for input(s): TROPIPOC in the last 168 hours.  Chemistry Recent Labs  Lab 02/03/20 1844 02/04/20 0500 02/07/20 0405 02/08/20 0819 02/09/20 0630  NA 141   < > 141 143 141  K 4.8   < > 3.9 3.6 3.2*  CL 108   < > 110 108 107  CO2 22   < > 23 23 27   GLUCOSE 100*   < > 82 131* 83  BUN 19   < > 20 17 18   CREATININE 3.01*   < > 3.18* 3.07* 2.85*  CALCIUM 8.6*   < > 8.2* 8.3* 7.9*  PROT 8.0  --   --   --   --   ALBUMIN 2.8*  --   --   --   --   AST 42*  --   --   --   --   ALT 16  --   --   --   --   ALKPHOS 47  --   --   --   --   BILITOT 1.9*  --   --   --   --   GFRNONAA 14*   < > 13* 14* 15*  GFRAA 16*   < > 15* 16* 18*  ANIONGAP 11   < > 8 12 7    < > = values in this interval not displayed.    Hematology Recent  Labs  Lab 02/03/20 1844  WBC 7.4  RBC 4.16  HGB 12.5  HCT 39.2  MCV 94.2  MCH 30.0  MCHC 31.9  RDW 16.9*  PLT 150   BNP Recent Labs  Lab 02/03/20 1844  BNP 222.9*    DDimer No results for input(s): DDIMER in the last 168 hours.   Radiology  No results found.  Cardiac Studies   TTE 02/05/2020 1. Left ventricular ejection fraction, by estimation, is 60 to 65%. The  left ventricle has normal function. The left ventricle has no regional  wall motion abnormalities. Left ventricular diastolic function could not  be evaluated.  2. Right ventricular systolic function is normal. The right ventricular  size is normal. There is mildly elevated pulmonary artery systolic  pressure. The estimated right ventricular systolic pressure is 35.4 mmHg.  3. The mitral valve is degenerative. Trivial mitral valve regurgitation.  No evidence of mitral stenosis.  4. The aortic valve is tricuspid. Aortic valve regurgitation is not  visualized. Mild aortic valve stenosis. Aortic valve area, by VTI measures  1.61 cm. Aortic valve mean gradient measures 12.6 mmHg. Aortic valve Vmax  measures 2.53 m/s.  5. The inferior vena cava is dilated in size with >50% respiratory  variability, suggesting right atrial pressure of 8 mmHg.   Patient Profile  Jackie Parker is a 79 y.o. female with diastolic HF, CKD, persistent Afib, Asthma, obesity (BMI 45), admitted 02/03/2020 for acute diastolic HF.   Assessment & Plan   1. Acute Diastolic HF -negative 656 cc yesterday. -7.3 L since admit.  -still SOB and still volume overloaded. Will increase to lasix 160 mg BID and add metolazone 5 mg prior to first lasix dose -will replace K (40 mg TID today) -Cr improving with diuresis. Will continue  -RHC was planned last week by rounding MD. Will take place 6/1.   2. Acute Kidney Injury/CKD -Cr improving with diuresis -2/2 ADHF  3. Persistent  Afib -in and out  -continue dilt an amio -no AC due to  uterine bleeding   4. HTN - acceptable  FEN - no IVF - diet: salt restricted - dvt ppx: heparin - code: full   For questions or updates, please contact [place] Please consult [email or web address] for contact info under   Time Spent with Patient: I have spent a total of 25 minutes with patient reviewing hospital notes, telemetry, EKGs, labs and examining the patient as well as establishing an assessment and plan that was discussed with the patient.  > 50% of time was spent in direct patient care.    Signed, Addison Naegeli. Audie Box, [place]  02/09/2020 9:17 AM

## 2020-02-09 NOTE — Progress Notes (Signed)
[facility] Progress Note   Subjective:   1 L of urine output yesterday but likely some unmeasured.  Patient has no complaints today and feels as though fluid is improving but weight is higher today.  Objective Vitals:   02/09/20 0344 02/09/20 0855 02/09/20 0919 02/09/20 1256  BP: 114/64 134/62  (!) 107/55  Pulse: 83 85  85  Resp: 20 18  20   Temp: 98.4 F (36.9 C) 98.2 F (36.8 C)  98.4 F (36.9 C)  TempSrc: Oral Oral  Oral  SpO2: 95% 100% 98% 96%  Weight: 112.3 kg     Height:       Physical Exam General: Sitting in bed, no distress Heart: Normal rate, no audible murmurs Lungs: normal WOB, bilateral chest rise Extremities: 2+ pitting tibial edema Skin warm and dry Neuro nonfocal  Additional Objective Labs: Basic Metabolic Panel: Recent Labs  Lab 02/07/20 0405 02/08/20 0819 02/09/20 0630  NA 141 143 141  K 3.9 3.6 3.2*  CL 110 108 107  CO2 23 23 27   GLUCOSE 82 131* 83  BUN 20 17 18   CREATININE 3.18* 3.07* 2.85*  CALCIUM 8.2* 8.3* 7.9*   Liver Function Tests: Recent Labs  Lab 02/03/20 1844  AST 42*  ALT 16  ALKPHOS 47  BILITOT 1.9*  PROT 8.0  ALBUMIN 2.8*   No results for input(s): LIPASE, AMYLASE in the last 168 hours. CBC: Recent Labs  Lab 02/03/20 1844  WBC 7.4  NEUTROABS 3.7  HGB 12.5  HCT 39.2  MCV 94.2  PLT 150   Blood Culture    Component Value Date/Time   SDES URINE, CLEAN CATCH 08/01/2019 1144   SPECREQUEST  08/01/2019 1144    NONE Performed at [facility], [place] [address] [place], [place] [address]    CULT 70,000 COLONIES/mL KLEBSIELLA PNEUMONIAE (A) 08/01/2019 1144   REPTSTATUS 08/03/2019 FINAL 08/01/2019 1144    Cardiac Enzymes: No results for input(s): CKTOTAL, CKMB, CKMBINDEX, TROPONINI in the last 168 hours. CBG: Recent Labs  Lab 02/08/20 0621 02/08/20 1123 02/08/20 2056 02/09/20 0612 02/09/20 1124  GLUCAP 78 106* 99 82 112*   Iron Studies: No results for input(s): IRON, TIBC, TRANSFERRIN,  FERRITIN in the last 72 hours.  Studies/Results: No results found. Medications: . sodium chloride    . furosemide 160 mg (02/09/20 1021)   . amiodarone  200 mg Oral Daily  . brimonidine  1 drop Right Eye TID  . brinzolamide  1 drop Right Eye TID  . diltiazem  360 mg Oral Daily  . heparin injection (subcutaneous)  5,000 Units Subcutaneous Q8H  . latanoprost  1 drop Both Eyes QHS  . medroxyPROGESTERone  20 mg Oral Daily  . mometasone-formoterol  2 puff Inhalation BID  . potassium chloride  40 mEq Oral TID  . sodium chloride flush  3 mL Intravenous Q12H  . sodium chloride flush  3 mL Intravenous Q12H      Assessment/Plan **AKI on CKD 3b:   Baseline creatinine 2. Longstanding CKD which is most likely HTN nephrosclerosis.    No signs of recent obstruction.  Crt now improving. Requiring diuresis due to massive volume overload.  Possibly cardiorenal syndrome.  Creatinine improving now 2.85. Agree with cardiology that ongoing diuresis as needed.  Increasing Lasix to 160 mg IV twice daily and plans for right heart cath on 6/1.  May need to consider metolazone or increase frequency of Lasix if she does not improve.  [place] cardiology assistance  5/26 -  Discussed her current GFR, CKD staging and what constitutes ESRD.  Hopefully we can avoid need for RRT this admission --> briefly introduced the concept and will go into more detail per progress.  Discussed 5/27 that no RRT needed at this time.  Will follow her in [place] [facility] where she has not been seen before.   **Acute on chronic HFpEF:  Cardiology following; TTE 5/26 with normal EF, dilated IVC.  Needs continued diuresis and increasing Lasix today, I/Os, na/fluid restrict, daily weight.  Agree with increasing diuretics per cardiology.  **A fib:  Per cardiology  **HTN: per above, avoid hypotension in setting of diuresis and AKI.    **Pyuria: Present on urinalysis.  No symptoms of urinary tract infection.  Could consider treatment for  UTI if dysuria or frequency become present

## 2020-02-09 NOTE — Plan of Care (Signed)

## 2020-02-10 LAB — BASIC METABOLIC PANEL
Anion gap: 9 (ref 5–15)
BUN: 20 mg/dL (ref 8–23)
CO2: 26 mmol/L (ref 22–32)
Calcium: 7.9 mg/dL — ABNORMAL LOW (ref 8.9–10.3)
Chloride: 107 mmol/L (ref 98–111)
Creatinine, Ser: 3.05 mg/dL — ABNORMAL HIGH (ref 0.44–1.00)
GFR calc Af Amer: 16 mL/min — ABNORMAL LOW (ref >60)
GFR calc non Af Amer: 14 mL/min — ABNORMAL LOW (ref >60)
Glucose, Bld: 96 mg/dL (ref 70–99)
Potassium: 3.4 mmol/L — ABNORMAL LOW (ref 3.5–5.1)
Sodium: 142 mmol/L (ref 135–145)

## 2020-02-10 LAB — GLUCOSE, CAPILLARY
Glucose-Capillary: 113 mg/dL — ABNORMAL HIGH (ref 70–99)
Glucose-Capillary: 123 mg/dL — ABNORMAL HIGH (ref 70–99)
Glucose-Capillary: 119 mg/dL — ABNORMAL HIGH (ref 70–99)

## 2020-02-10 MED ORDER — SODIUM CHLORIDE 0.9 % IV SOLN: INTRAVENOUS | Status: DC

## 2020-02-10 MED ORDER — SODIUM CHLORIDE 0.9 % IV SOLN: 250.0000 mL | INTRAVENOUS | Status: DC | PRN

## 2020-02-10 MED ORDER — SODIUM CHLORIDE 0.9% FLUSH: 3.0000 mL | INTRAVENOUS | Status: DC | PRN

## 2020-02-10 MED ORDER — ASPIRIN 81 MG PO CHEW
81.0000 mg | CHEWABLE_TABLET | ORAL | Status: AC
  Administered 2020-02-11: 81 mg via ORAL
  Filled 2020-02-10: qty 1

## 2020-02-10 MED ORDER — POTASSIUM CHLORIDE CRYS ER 20 MEQ PO TBCR
40.0000 meq | EXTENDED_RELEASE_TABLET | Freq: Three times a day (TID) | ORAL | Status: DC
  Administered 2020-02-10 (×3): 40 meq via ORAL
  Filled 2020-02-10 (×4): qty 2

## 2020-02-10 NOTE — Progress Notes (Signed)
Pt refuses cpap for tonight.  RT will continue to monitor/

## 2020-02-10 NOTE — Plan of Care (Signed)
  Problem: Education: Goal: Ability to demonstrate management of disease process will improve Outcome: Progressing Goal: Ability to verbalize understanding of medication therapies will improve Outcome: Progressing   Problem: Activity: Goal: Capacity to carry out activities will improve Outcome: Progressing   Problem: Cardiac: Goal: Ability to achieve and maintain adequate cardiopulmonary perfusion will improve Outcome: Progressing   Problem: Education: Goal: Knowledge of General Education information will improve Description: Including pain rating scale, medication(s)/side effects and non-pharmacologic comfort measures Outcome: Progressing   Problem: Health Behavior/Discharge Planning: Goal: Ability to manage health-related needs will improve Outcome: Progressing   Problem: Clinical Measurements: Goal: Ability to maintain clinical measurements within normal limits will improve Outcome: Progressing Goal: Will remain free from infection Outcome: Progressing Goal: Diagnostic test results will improve Outcome: Progressing Goal: Respiratory complications will improve Outcome: Progressing Goal: Cardiovascular complication will be avoided Outcome: Progressing   Problem: Activity: Goal: Risk for activity intolerance will decrease Outcome: Progressing   Problem: Nutrition: Goal: Adequate nutrition will be maintained Outcome: Progressing   Problem: Coping: Goal: Level of anxiety will decrease Outcome: Progressing   Problem: Elimination: Goal: Will not experience complications related to bowel motility Outcome: Progressing Goal: Will not experience complications related to urinary retention Outcome: Progressing   Problem: Pain Managment: Goal: General experience of comfort will improve Outcome: Progressing   Problem: Safety: Goal: Ability to remain free from injury will improve Outcome: Progressing   Problem: Skin Integrity: Goal: Risk for impaired skin integrity  will decrease Outcome: Progressing   Problem: Education: Goal: Understanding of CV disease, CV risk reduction, and recovery process will improve Outcome: Progressing Goal: Individualized Educational Video(s) Outcome: Progressing   Problem: Activity: Goal: Ability to return to baseline activity level will improve Outcome: Progressing   Problem: Cardiovascular: Goal: Ability to achieve and maintain adequate cardiovascular perfusion will improve Outcome: Progressing Goal: Vascular access site(s) Level 0-1 will be maintained Outcome: Progressing   Problem: Health Behavior/Discharge Planning: Goal: Ability to safely manage health-related needs after discharge will improve Outcome: Progressing

## 2020-02-10 NOTE — Progress Notes (Signed)
[facility] NEPHROLOGY PROGRESS NOTE  Assessment/ Plan: Pt is a 79 y.o. yo female with history of CHF, CKD, A. fib, asthma, obesity, admitted on 5/24 for acute CHF exacerbation.  #Acute kidney injury on CKD 3: Baseline creatinine around 2.  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  No obstruction and AKI probably cardiorenal syndrome.  Responding with diuretics and creatinine level stable.  Received IV Lasix high-dose.  Plan to resume p.o. Lasix post cath and monitor monitor BMP.  She looks euvolemic today and has no uremic feature.  #Acute on chronic CHF: Cardiology following.  Plan for right heart catheter tomorrow noted.  Received IV diuretics.  Will be n.p.o. from midnight and plan to start p.o. Lasix post cath.  #Atrial fibrillation: On amiodarone.  #Hypertension: Blood pressure acceptable.  #Hypokalemia: Replete potassium chloride.  Subjective: Seen and examined at bedside.  Lying on bed comfortable.  In room air.  Denies nausea vomiting chest pain shortness of breath.  Urine output recorded 1.3 L. Objective Vital signs in last 24 hours: Vitals:   02/10/20 0507 02/10/20 0719 02/10/20 0942 02/10/20 1142  BP: (!) 104/44  (!) 122/58 (!) 107/51  Pulse: 82  87 84  Resp: 20  20 20   Temp: 98.7 F (37.1 C)  98.7 F (37.1 C) 98.2 F (36.8 C)  TempSrc: Oral  Oral Oral  SpO2: 96% 97% 97% 98%  Weight:      Height:       Weight change: 0.68 kg  Intake/Output Summary (Last 24 hours) at 02/10/2020 1232 Last data filed at 02/10/2020 1142 Gross per 24 hour  Intake 1026 ml  Output 2060 ml  Net -1034 ml       Labs: Basic Metabolic Panel: Recent Labs  Lab 02/08/20 0819 02/09/20 0630 02/10/20 0452  NA 143 141 142  K 3.6 3.2* 3.4*  CL 108 107 107  CO2 23 27 26   GLUCOSE 131* 83 96  BUN 17 18 20   CREATININE 3.07* 2.85* 3.05*  CALCIUM 8.3* 7.9* 7.9*   Liver Function Tests: Recent Labs  Lab 02/03/20 1844  AST 42*  ALT 16  ALKPHOS 47  BILITOT 1.9*  PROT  8.0  ALBUMIN 2.8*   No results for input(s): LIPASE, AMYLASE in the last 168 hours. No results for input(s): AMMONIA in the last 168 hours. CBC: Recent Labs  Lab 02/03/20 1844  WBC 7.4  NEUTROABS 3.7  HGB 12.5  HCT 39.2  MCV 94.2  PLT 150   Cardiac Enzymes: No results for input(s): CKTOTAL, CKMB, CKMBINDEX, TROPONINI in the last 168 hours. CBG: Recent Labs  Lab 02/09/20 0612 02/09/20 1124 02/09/20 1630 02/09/20 2119 02/10/20 1141  GLUCAP 82 112* 146* 115* 113*    Iron Studies: No results for input(s): IRON, TIBC, TRANSFERRIN, FERRITIN in the last 72 hours. Studies/Results: No results found.  Medications: Infusions: . sodium chloride    . sodium chloride    . [START ON 02/11/2020] sodium chloride      Scheduled Medications: . amiodarone  200 mg Oral Daily  . [START ON 02/11/2020] aspirin  81 mg Oral Pre-Cath  . brimonidine  1 drop Right Eye TID  . brinzolamide  1 drop Right Eye TID  . diltiazem  360 mg Oral Daily  . heparin injection (subcutaneous)  5,000 Units Subcutaneous Q8H  . latanoprost  1 drop Both Eyes QHS  . medroxyPROGESTERone  20 mg Oral Daily  . mometasone-formoterol  2 puff Inhalation BID  . potassium chloride  40 mEq Oral  TID  . sodium chloride flush  3 mL Intravenous Q12H  . sodium chloride flush  3 mL Intravenous Q12H    have reviewed scheduled and prn medications.  Physical Exam: General:NAD, comfortable Heart:RRR, s1s2 nl Lungs:clear b/l, no crackle Abdomen:soft, Non-tender, non-distended Extremities:No LE edema Neurology: Alert awake and following commands.  No asterixis  Jackie Parker 02/10/2020,12:32 PM  LOS: 7 days  Pager: [phone]

## 2020-02-10 NOTE — Progress Notes (Signed)
Cardiology Progress Note  Patient ID: Jackie Parker MRN: [id number] DOB: September 11, 1941 Date of Encounter: 02/10/2020  Primary Cardiologist: Sinclair Grooms, MD  Subjective   Chief Complaint: SOB  Breathing better less edema   ROS:  All other ROS reviewed and negative. Pertinent positives noted in the HPI.     Inpatient Medications  Scheduled Meds: . amiodarone  200 mg Oral Daily  . brimonidine  1 drop Right Eye TID  . brinzolamide  1 drop Right Eye TID  . diltiazem  360 mg Oral Daily  . heparin injection (subcutaneous)  5,000 Units Subcutaneous Q8H  . latanoprost  1 drop Both Eyes QHS  . medroxyPROGESTERone  20 mg Oral Daily  . mometasone-formoterol  2 puff Inhalation BID  . sodium chloride flush  3 mL Intravenous Q12H  . sodium chloride flush  3 mL Intravenous Q12H   Continuous Infusions: . sodium chloride     PRN Meds: sodium chloride, acetaminophen, albuterol, allopurinol, fluocinonide ointment, sodium chloride flush   Vital Signs   Vitals:   02/09/20 2016 02/10/20 0059 02/10/20 0507 02/10/20 0719  BP: (!) 136/59  (!) 104/44   Pulse: 90  82   Resp: 20  20   Temp: 98.2 F (36.8 C)  98.7 F (37.1 C)   TempSrc: Oral  Oral   SpO2: 93%  96% 97%  Weight:  113 kg    Height:        Intake/Output Summary (Last 24 hours) at 02/10/2020 0842 Last data filed at 02/10/2020 0511 Gross per 24 hour  Intake 906 ml  Output 1660 ml  Net -754 ml   Last 3 Weights 02/10/2020 02/09/2020 02/08/2020  Weight (lbs) 249 lb 1.6 oz 247 lb 9.6 oz 246 lb 11.2 oz  Weight (kg) 112.991 kg 112.311 kg 111.902 kg      Telemetry  Overnight telemetry shows SR 80s, which I personally reviewed.   Physical Exam   Vitals:   02/09/20 2016 02/10/20 0059 02/10/20 0507 02/10/20 0719  BP: (!) 136/59  (!) 104/44   Pulse: 90  82   Resp: 20  20   Temp: 98.2 F (36.8 C)  98.7 F (37.1 C)   TempSrc: Oral  Oral   SpO2: 93%  96% 97%  Weight:  113 kg    Height:         Intake/Output Summary  (Last 24 hours) at 02/10/2020 0842 Last data filed at 02/10/2020 0511 Gross per 24 hour  Intake 906 ml  Output 1660 ml  Net -754 ml    Last 3 Weights 02/10/2020 02/09/2020 02/08/2020  Weight (lbs) 249 lb 1.6 oz 247 lb 9.6 oz 246 lb 11.2 oz  Weight (kg) 112.991 kg 112.311 kg 111.902 kg    Body mass index is 45.56 kg/m.   Affect appropriate Healthy:  appears stated age 14: normal Neck supple with no adenopathy JVP normal no bruits no thyromegaly Lungs clear with no wheezing and good diaphragmatic motion Heart:  S1/S2 no murmur, no rub, gallop or click PMI normal Abdomen: benighn, BS positve, no tenderness, no AAA no bruit.  No HSM or HJR Distal pulses intact with no bruits Plus 2 LE edema Neuro non-focal Skin warm and dry Post bilateral TKR;s    Labs  High Sensitivity Troponin:  No results for input(s): TROPONINIHS in the last 720 hours.   Cardiac EnzymesNo results for input(s): TROPONINI in the last 168 hours. No results for input(s): TROPIPOC in the last 168 hours.  Chemistry Recent  Labs  Lab 02/03/20 1844 02/04/20 0500 02/08/20 0819 02/09/20 0630 02/10/20 0452  NA 141   < > 143 141 142  K 4.8   < > 3.6 3.2* 3.4*  CL 108   < > 108 107 107  CO2 22   < > 23 27 26   GLUCOSE 100*   < > 131* 83 96  BUN 19   < > 17 18 20   CREATININE 3.01*   < > 3.07* 2.85* 3.05*  CALCIUM 8.6*   < > 8.3* 7.9* 7.9*  PROT 8.0  --   --   --   --   ALBUMIN 2.8*  --   --   --   --   AST 42*  --   --   --   --   ALT 16  --   --   --   --   ALKPHOS 47  --   --   --   --   BILITOT 1.9*  --   --   --   --   GFRNONAA 14*   < > 14* 15* 14*  GFRAA 16*   < > 16* 18* 16*  ANIONGAP 11   < > 12 7 9    < > = values in this interval not displayed.    Hematology Recent Labs  Lab 02/03/20 1844  WBC 7.4  RBC 4.16  HGB 12.5  HCT 39.2  MCV 94.2  MCH 30.0  MCHC 31.9  RDW 16.9*  PLT 150   BNP Recent Labs  Lab 02/03/20 1844  BNP 222.9*    DDimer No results for input(s): DDIMER in the last  168 hours.   Radiology  No results found.  Cardiac Studies   TTE 02/05/2020 1. Left ventricular ejection fraction, by estimation, is 60 to 65%. The  left ventricle has normal function. The left ventricle has no regional  wall motion abnormalities. Left ventricular diastolic function could not  be evaluated.  2. Right ventricular systolic function is normal. The right ventricular  size is normal. There is mildly elevated pulmonary artery systolic  pressure. The estimated right ventricular systolic pressure is 07.3 mmHg.  3. The mitral valve is degenerative. Trivial mitral valve regurgitation.  No evidence of mitral stenosis.  4. The aortic valve is tricuspid. Aortic valve regurgitation is not  visualized. Mild aortic valve stenosis. Aortic valve area, by VTI measures  1.61 cm. Aortic valve mean gradient measures 12.6 mmHg. Aortic valve Vmax  measures 2.53 m/s.  5. The inferior vena cava is dilated in size with >50% respiratory  variability, suggesting right atrial pressure of 8 mmHg.   Patient Profile  Jackie Parker is a 79 y.o. female with diastolic HF, CKD, persistent Afib, Asthma, obesity (BMI 45), admitted 02/03/2020 for acute diastolic HF.   Assessment & Plan   1. Acute Diastolic HF -negative 710 cc yesterday.-8.2 L since admit.  -continue iv lasix and zaroxyln supplement K  -Cr high but stable 3.05 and K 3.4  -RHC tomoorow if pressures good consider d/c Tuesday afternoon / Wednesday Change lasix to PO post cath   2. Acute Kidney Injury/CKD -Cr improving with diuresis -2/2 ADHF  3. Persistent Afib -in and out  -continue dilt an amio -no AC due to uterine bleeding   4. HTN -Well controlled.  Continue current medications and low sodium Dash type diet.     For questions or updates, please contact [place] Please consult [email or web address] for contact info under  Time Spent with Patient: I have spent a total of 25 minutes with patient reviewing hospital  notes, telemetry, EKGs, labs and examining the patient as well as establishing an assessment and plan that was discussed with the patient.  > 50% of time was spent in direct patient care.    Jenkins Rouge MD [facility]

## 2020-02-11 ENCOUNTER — Encounter (HOSPITAL_COMMUNITY)
Admission: AD | Disposition: A | Discharge status: Home / Self Care | Payer: Self-pay | Source: Ambulatory Visit | Attending: Interventional Cardiology

## 2020-02-11 HISTORY — PX: RIGHT HEART CATH: CATH118263

## 2020-02-11 LAB — POCT I-STAT EG7
Acid-Base Excess: 2 mmol/L (ref 0.0–2.0)
Bicarbonate: 27.8 mmol/L (ref 20.0–28.0)
Calcium, Ion: 1.11 mmol/L — ABNORMAL LOW (ref 1.15–1.40)
HCT: 39 % (ref 36.0–46.0)
Hemoglobin: 13.3 g/dL (ref 12.0–15.0)
O2 Saturation: 82 %
Potassium: 4.3 mmol/L (ref 3.5–5.1)
Sodium: 142 mmol/L (ref 135–145)
TCO2: 29 mmol/L (ref 22–32)
pCO2, Ven: 48.8 mmHg (ref 44.0–60.0)
pH, Ven: 7.363 (ref 7.250–7.430)
pO2, Ven: 49 mmHg — ABNORMAL HIGH (ref 32.0–45.0)

## 2020-02-11 LAB — GLUCOSE, CAPILLARY
Glucose-Capillary: 90 mg/dL (ref 70–99)
Glucose-Capillary: 87 mg/dL (ref 70–99)
Glucose-Capillary: 85 mg/dL (ref 70–99)
Glucose-Capillary: 167 mg/dL — ABNORMAL HIGH (ref 70–99)

## 2020-02-11 LAB — BASIC METABOLIC PANEL
Anion gap: 14 (ref 5–15)
BUN: 21 mg/dL (ref 8–23)
CO2: 22 mmol/L (ref 22–32)
Calcium: 8 mg/dL — ABNORMAL LOW (ref 8.9–10.3)
Chloride: 105 mmol/L (ref 98–111)
Creatinine, Ser: 3.37 mg/dL — ABNORMAL HIGH (ref 0.44–1.00)
GFR calc Af Amer: 14 mL/min — ABNORMAL LOW (ref >60)
GFR calc non Af Amer: 12 mL/min — ABNORMAL LOW (ref >60)
Glucose, Bld: 92 mg/dL (ref 70–99)
Potassium: 4.3 mmol/L (ref 3.5–5.1)
Sodium: 141 mmol/L (ref 135–145)

## 2020-02-11 LAB — CBC
HCT: 34.9 % — ABNORMAL LOW (ref 36.0–46.0)
Hemoglobin: 11.1 g/dL — ABNORMAL LOW (ref 12.0–15.0)
MCH: 29.9 pg (ref 26.0–34.0)
MCHC: 31.8 g/dL (ref 30.0–36.0)
MCV: 94.1 fL (ref 80.0–100.0)
Platelets: 139 10*3/uL — ABNORMAL LOW (ref 150–400)
RBC: 3.71 MIL/uL — ABNORMAL LOW (ref 3.87–5.11)
RDW: 17.2 % — ABNORMAL HIGH (ref 11.5–15.5)
WBC: 8.5 10*3/uL (ref 4.0–10.5)
nRBC: 0 % (ref 0.0–0.2)

## 2020-02-11 SURGERY — RIGHT HEART CATH

## 2020-02-11 MED ORDER — ONDANSETRON HCL 4 MG/2ML IJ SOLN
4.0000 mg | Freq: Four times a day (QID) | INTRAMUSCULAR | Status: DC | PRN
  Filled 2020-02-11: qty 2

## 2020-02-11 MED ORDER — ONDANSETRON HCL 4 MG/2ML IJ SOLN
4.0000 mg | Freq: Four times a day (QID) | INTRAMUSCULAR | Status: DC | PRN
  Administered 2020-02-11: 4 mg via INTRAVENOUS

## 2020-02-11 MED ORDER — HEPARIN SODIUM (PORCINE) 5000 UNIT/ML IJ SOLN
5000.0000 [IU] | Freq: Three times a day (TID) | INTRAMUSCULAR | Status: DC
  Administered 2020-02-11 – 2020-02-14 (×8): 5000 [IU] via SUBCUTANEOUS
  Filled 2020-02-11 (×9): qty 1

## 2020-02-11 MED ORDER — SODIUM CHLORIDE 0.9% FLUSH: 3.0000 mL | INTRAVENOUS | Status: DC | PRN

## 2020-02-11 MED ORDER — HYDRALAZINE HCL 20 MG/ML IJ SOLN: 10.0000 mg | INTRAMUSCULAR | Status: AC | PRN

## 2020-02-11 MED ORDER — HEPARIN (PORCINE) IN NACL 1000-0.9 UT/500ML-% IV SOLN
INTRAVENOUS | Status: DC | PRN
  Administered 2020-02-11: 500 mL

## 2020-02-11 MED ORDER — HEPARIN (PORCINE) IN NACL 1000-0.9 UT/500ML-% IV SOLN
INTRAVENOUS | Status: AC
  Filled 2020-02-11: qty 500

## 2020-02-11 MED ORDER — SODIUM CHLORIDE 0.9 % IV SOLN: 250.0000 mL | INTRAVENOUS | Status: DC | PRN

## 2020-02-11 MED ORDER — FUROSEMIDE 10 MG/ML IJ SOLN
60.0000 mg | Freq: Two times a day (BID) | INTRAMUSCULAR | Status: DC
  Administered 2020-02-11 – 2020-02-12 (×2): 60 mg via INTRAVENOUS
  Filled 2020-02-11 (×2): qty 6

## 2020-02-11 MED ORDER — LIDOCAINE HCL (PF) 1 % IJ SOLN
INTRAMUSCULAR | Status: DC | PRN
  Administered 2020-02-11: 2 mL

## 2020-02-11 MED ORDER — LIDOCAINE HCL (PF) 1 % IJ SOLN
INTRAMUSCULAR | Status: AC
  Filled 2020-02-11: qty 30

## 2020-02-11 MED ORDER — SODIUM CHLORIDE 0.9% FLUSH
3.0000 mL | Freq: Two times a day (BID) | INTRAVENOUS | Status: DC
  Administered 2020-02-11 – 2020-02-14 (×4): 3 mL via INTRAVENOUS

## 2020-02-11 MED ORDER — POTASSIUM CHLORIDE CRYS ER 20 MEQ PO TBCR
40.0000 meq | EXTENDED_RELEASE_TABLET | Freq: Every day | ORAL | Status: DC
  Administered 2020-02-12 – 2020-02-13 (×2): 40 meq via ORAL
  Filled 2020-02-11 (×3): qty 2

## 2020-02-11 SURGICAL SUPPLY — 8 items
CATH BALLN WEDGE 5F 110CM (CATHETERS) ×2 IMPLANT
PROTECTION STATION PRESSURIZED (MISCELLANEOUS) ×3
SHEATH GLIDE SLENDER 4/5FR (SHEATH) ×2 IMPLANT
STATION PROTECTION PRESSURIZED (MISCELLANEOUS) IMPLANT
TRANSDUCER W/STOPCOCK (MISCELLANEOUS) ×2 IMPLANT
TRAY CARDIAC CATHETERIZATION (CUSTOM PROCEDURE TRAY) ×2 IMPLANT
TUBING ART PRESS 72  MALE/FEM (TUBING) ×3
TUBING ART PRESS 72 MALE/FEM (TUBING) IMPLANT

## 2020-02-11 NOTE — TOC Progression Note (Signed)
Transition of Care [facility]) - Progression Note    Patient Details  Name: Jackie Parker MRN: [id number] Date of Birth: 1941-05-28  [facility] CM/SW Contact  Zenon Mayo, RN Phone Number: 02/11/2020, 4:31 PM  Clinical Narrative:    NCM spoke with patient, asked if she would like to be set up with a [facility] for CHF disease management, she states no, she has a Therapist, sports that comes to check on her with [facility].  She has a walker and a cane at home. Her daughter lives with her.  She has transportation at dc and to her MD apts.  She has no issues with getting her medications. TOC team will cont to follow for dc needs.   Expected Discharge Plan: Home/Self Care Barriers to Discharge: No Barriers Identified  Expected Discharge Plan and Services Expected Discharge Plan: Home/Self Care       Living arrangements for the past 2 months: Single Family Home                                       Social Determinants of Health (SDOH) Interventions    Readmission Risk Interventions No flowsheet data found.

## 2020-02-11 NOTE — Interval H&P Note (Signed)
History and Physical Interval Note:  02/11/2020 4:36 PM  Jackie Parker  has presented today for surgery, with the diagnosis of heart failure.  The various methods of treatment have been discussed with the patient and family. After consideration of risks, benefits and other options for treatment, the patient has consented to  Procedure(s): RIGHT HEART CATH (N/A) as a surgical intervention.  The patient's history has been reviewed, patient examined, no change in status, stable for surgery.  I have reviewed the patient's chart and labs.  Questions were answered to the patient's satisfaction.     Collier Salina [facility] 02/11/2020 4:37 PM'

## 2020-02-11 NOTE — H&P (View-Only) (Signed)
Progress Note  Patient Name: Jackie Parker Date of Encounter: 02/11/2020  Primary Cardiologist:   Sinclair Grooms, MD   Subjective   She says that she is breathing OK.  No pain.    Inpatient Medications    Scheduled Meds: . amiodarone  200 mg Oral Daily  . brimonidine  1 drop Right Eye TID  . brinzolamide  1 drop Right Eye TID  . diltiazem  360 mg Oral Daily  . heparin injection (subcutaneous)  5,000 Units Subcutaneous Q8H  . latanoprost  1 drop Both Eyes QHS  . medroxyPROGESTERone  20 mg Oral Daily  . mometasone-formoterol  2 puff Inhalation BID  . potassium chloride  40 mEq Oral TID  . sodium chloride flush  3 mL Intravenous Q12H  . sodium chloride flush  3 mL Intravenous Q12H   Continuous Infusions: . sodium chloride    . sodium chloride    . sodium chloride 10 mL/hr at 02/11/20 0608   PRN Meds: sodium chloride, sodium chloride, acetaminophen, albuterol, allopurinol, fluocinonide ointment, sodium chloride flush, sodium chloride flush   Vital Signs    Vitals:   02/10/20 1953 02/10/20 2013 02/11/20 0451 02/11/20 0716  BP: (!) 123/53  (!) 126/56   Pulse: 78  81   Resp: 18  20   Temp: 98.4 F (36.9 C)  98.9 F (37.2 C)   TempSrc: Oral  Oral   SpO2: 96% 98% 100% 98%  Weight:   113.4 kg   Height:        Intake/Output Summary (Last 24 hours) at 02/11/2020 0746 Last data filed at 02/11/2020 0654 Gross per 24 hour  Intake 727.29 ml  Output 1350 ml  Net -622.71 ml   Filed Weights   02/09/20 0344 02/10/20 0059 02/11/20 0451  Weight: 112.3 kg 113 kg 113.4 kg    Telemetry    NSR, rare ectopy.  - Personally Reviewed  ECG    NA - Personally Reviewed  Physical Exam   GEN: No acute distress.   Neck: No  JVD Cardiac: RRR, 2/6 brief apical systolic murmur, no diastolic murmurs, rubs, or gallops.  Respiratory:    Decreased breath sounds with mild diffuse wheezing GI: Soft, nontender, non-distended  MS:    Mild edema; No deformity. Neuro:  Nonfocal    Psych: Normal affect   Labs    Chemistry Recent Labs  Lab 02/09/20 0630 02/10/20 0452 02/11/20 0453  NA 141 142 141  K 3.2* 3.4* 4.3  CL 107 107 105  CO2 27 26 22   GLUCOSE 83 96 92  BUN 18 20 21   CREATININE 2.85* 3.05* 3.37*  CALCIUM 7.9* 7.9* 8.0*  GFRNONAA 15* 14* 12*  GFRAA 18* 16* 14*  ANIONGAP 7 9 14      HematologyNo results for input(s): WBC, RBC, HGB, HCT, MCV, MCH, MCHC, RDW, PLT in the last 168 hours.  Cardiac EnzymesNo results for input(s): TROPONINI in the last 168 hours. No results for input(s): TROPIPOC in the last 168 hours.   BNPNo results for input(s): BNP, PROBNP in the last 168 hours.   DDimer No results for input(s): DDIMER in the last 168 hours.   Radiology    No results found.  Cardiac Studies   ECHO:  1. Left ventricular ejection fraction, by estimation, is 60 to 65%. The  left ventricle has normal function. The left ventricle has no regional  wall motion abnormalities. Left ventricular diastolic function could not  be evaluated.  2. Right ventricular systolic  function is normal. The right ventricular  size is normal. There is mildly elevated pulmonary artery systolic  pressure. The estimated right ventricular systolic pressure is 43.2 mmHg.  3. The mitral valve is degenerative. Trivial mitral valve regurgitation.  No evidence of mitral stenosis.  4. The aortic valve is tricuspid. Aortic valve regurgitation is not  visualized. Mild aortic valve stenosis. Aortic valve area, by VTI measures  1.61 cm. Aortic valve mean gradient measures 12.6 mmHg. Aortic valve Vmax  measures 2.53 m/s.  5. The inferior vena cava is dilated in size with >50% respiratory  variability, suggesting right atrial pressure of 8 mmHg.   Patient Profile     79 y.o. female  with diastolic HF, CKD, persistent Afib, Asthma, obesity (BMI 45), admitted 02/03/2020 for acute diastolic HF.   Assessment & Plan    Acute diastolic HF:   Plan for right heart cath.   Net  negative 8.9 liters.   No diuretic for the last few days.    AKI:    Creat is elevated and relatively stable.  Follow.  Holding diuretic pending right heart cath.   Atrial fib:    Paroxysmal.  Maintaining NSR currently. No anticoagulation secondary to GI bleed.  On amiodarone and Diltiazem.    HTN:    BP is well controlled. No change in therapy.   For questions or updates, please contact [place] Please consult [email or web address] for contact info under Cardiology/STEMI.   Signed, Minus Breeding, MD  02/11/2020, 7:46 AM

## 2020-02-11 NOTE — Care Management Important Message (Signed)
Important Message  Patient Details  Name: Jackie Parker MRN: [id number] Date of Birth: 10/04/1940   Medicare Important Message Given:  Yes     Shelda Altes 02/11/2020, 12:32 PM

## 2020-02-11 NOTE — Progress Notes (Signed)
Progress Note  Patient Name: Jackie Parker Date of Encounter: 02/11/2020  Primary Cardiologist:   Sinclair Grooms, MD   Subjective   She says that she is breathing OK.  No pain.    Inpatient Medications    Scheduled Meds:  amiodarone  200 mg Oral Daily   brimonidine  1 drop Right Eye TID   brinzolamide  1 drop Right Eye TID   diltiazem  360 mg Oral Daily   heparin injection (subcutaneous)  5,000 Units Subcutaneous Q8H   latanoprost  1 drop Both Eyes QHS   medroxyPROGESTERone  20 mg Oral Daily   mometasone-formoterol  2 puff Inhalation BID   potassium chloride  40 mEq Oral TID   sodium chloride flush  3 mL Intravenous Q12H   sodium chloride flush  3 mL Intravenous Q12H   Continuous Infusions:  sodium chloride     sodium chloride     sodium chloride 10 mL/hr at 02/11/20 0608   PRN Meds: sodium chloride, sodium chloride, acetaminophen, albuterol, allopurinol, fluocinonide ointment, sodium chloride flush, sodium chloride flush   Vital Signs    Vitals:   02/10/20 1953 02/10/20 2013 02/11/20 0451 02/11/20 0716  BP: (!) 123/53  (!) 126/56   Pulse: 78  81   Resp: 18  20   Temp: 98.4 F (36.9 C)  98.9 F (37.2 C)   TempSrc: Oral  Oral   SpO2: 96% 98% 100% 98%  Weight:   113.4 kg   Height:        Intake/Output Summary (Last 24 hours) at 02/11/2020 0746 Last data filed at 02/11/2020 0654 Gross per 24 hour  Intake 727.29 ml  Output 1350 ml  Net -622.71 ml   Filed Weights   02/09/20 0344 02/10/20 0059 02/11/20 0451  Weight: 112.3 kg 113 kg 113.4 kg    Telemetry    NSR, rare ectopy.  - Personally Reviewed  ECG    NA - Personally Reviewed  Physical Exam   GEN: No acute distress.   Neck: No  JVD Cardiac: RRR, 2/6 brief apical systolic murmur, no diastolic murmurs, rubs, or gallops.  Respiratory:    Decreased breath sounds with mild diffuse wheezing GI: Soft, nontender, non-distended  MS:    Mild edema; No deformity. Neuro:  Nonfocal    Psych: Normal affect   Labs    Chemistry Recent Labs  Lab 02/09/20 0630 02/10/20 0452 02/11/20 0453  NA 141 142 141  K 3.2* 3.4* 4.3  CL 107 107 105  CO2 27 26 22   GLUCOSE 83 96 92  BUN 18 20 21   CREATININE 2.85* 3.05* 3.37*  CALCIUM 7.9* 7.9* 8.0*  GFRNONAA 15* 14* 12*  GFRAA 18* 16* 14*  ANIONGAP 7 9 14      HematologyNo results for input(s): WBC, RBC, HGB, HCT, MCV, MCH, MCHC, RDW, PLT in the last 168 hours.  Cardiac EnzymesNo results for input(s): TROPONINI in the last 168 hours. No results for input(s): TROPIPOC in the last 168 hours.   BNPNo results for input(s): BNP, PROBNP in the last 168 hours.   DDimer No results for input(s): DDIMER in the last 168 hours.   Radiology    No results found.  Cardiac Studies   ECHO:  1. Left ventricular ejection fraction, by estimation, is 60 to 65%. The  left ventricle has normal function. The left ventricle has no regional  wall motion abnormalities. Left ventricular diastolic function could not  be evaluated.  2. Right ventricular systolic  function is normal. The right ventricular  size is normal. There is mildly elevated pulmonary artery systolic  pressure. The estimated right ventricular systolic pressure is 92.4 mmHg.  3. The mitral valve is degenerative. Trivial mitral valve regurgitation.  No evidence of mitral stenosis.  4. The aortic valve is tricuspid. Aortic valve regurgitation is not  visualized. Mild aortic valve stenosis. Aortic valve area, by VTI measures  1.61 cm. Aortic valve mean gradient measures 12.6 mmHg. Aortic valve Vmax  measures 2.53 m/s.  5. The inferior vena cava is dilated in size with >50% respiratory  variability, suggesting right atrial pressure of 8 mmHg.   Patient Profile     79 y.o. female  with diastolic HF, CKD, persistent Afib, Asthma, obesity (BMI 45), admitted 02/03/2020 for acute diastolic HF.   Assessment & Plan    Acute diastolic HF:   Plan for right heart cath.   Net  negative 8.9 liters.   No diuretic for the last few days.    AKI:    Creat is elevated and relatively stable.  Follow.  Holding diuretic pending right heart cath.   Atrial fib:    Paroxysmal.  Maintaining NSR currently. No anticoagulation secondary to GI bleed.  On amiodarone and Diltiazem.    HTN:    BP is well controlled. No change in therapy.   For questions or updates, please contact [place] Please consult [email or web address] for contact info under Cardiology/STEMI.   Signed, Minus Breeding, MD  02/11/2020, 7:46 AM

## 2020-02-11 NOTE — Plan of Care (Signed)
  Problem: Activity: Goal: Ability to return to baseline activity level will improve Outcome: Progressing   

## 2020-02-11 NOTE — Progress Notes (Signed)
Pt refused cpap

## 2020-02-11 NOTE — Progress Notes (Addendum)
[facility] NEPHROLOGY PROGRESS NOTE  Assessment/ Plan: Pt is a 79 y.o. yo female with history of CHF, CKD, A. fib, asthma, obesity, admitted on 5/24 for acute CHF exacerbation.  #Acute kidney injury on CKD 3, nonoliguric: Baseline creatinine around 2.  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  No obstruction and AKI probably cardiorenal syndrome.   Initially received IV Lasix with good response.  Plan for right heart cath therefore diuretics on hold.  Creatinine level mildly elevated to 3.37 today.  Monitor BMP.  Discussed with the patient and her daughter that the renal function may worsen after the cardiac cath.  Euvolemic on exam.  #Acute on chronic CHF: Cardiology following.  Plan for right heart catheter today.   Plan to start p.o. Lasix post cath.  #Atrial fibrillation: On amiodarone.  #Hypertension: Blood pressure acceptable.  #Hypokalemia: Improved after repletion of KCl.  Lower potassium dose to once a day.  Subjective: Seen and examined at bedside.  Lying on bed comfortable.  Not in distress.  Urine output recorded as 1000 cc.  Plan for heart procedure today.  Her daughter at bedside.  Denies chest pain, shortness of breath, nausea or vomiting. Objective Vital signs in last 24 hours: Vitals:   02/10/20 2013 02/11/20 0451 02/11/20 0716 02/11/20 0830  BP:  (!) 126/56  128/63  Pulse:  81  85  Resp:  20    Temp:  98.9 F (37.2 C)    TempSrc:  Oral    SpO2: 98% 100% 98% 100%  Weight:  113.4 kg    Height:       Weight change: 0.454 kg  Intake/Output Summary (Last 24 hours) at 02/11/2020 1117 Last data filed at 02/11/2020 0700 Gross per 24 hour  Intake 247.29 ml  Output 1350 ml  Net -1102.71 ml       Labs: Basic Metabolic Panel: Recent Labs  Lab 02/09/20 0630 02/10/20 0452 02/11/20 0453  NA 141 142 141  K 3.2* 3.4* 4.3  CL 107 107 105  CO2 27 26 22   GLUCOSE 83 96 92  BUN 18 20 21   CREATININE 2.85* 3.05* 3.37*  CALCIUM 7.9* 7.9* 8.0*   Liver  Function Tests: No results for input(s): AST, ALT, ALKPHOS, BILITOT, PROT, ALBUMIN in the last 168 hours. No results for input(s): LIPASE, AMYLASE in the last 168 hours. No results for input(s): AMMONIA in the last 168 hours. CBC: No results for input(s): WBC, NEUTROABS, HGB, HCT, MCV, PLT in the last 168 hours. Cardiac Enzymes: No results for input(s): CKTOTAL, CKMB, CKMBINDEX, TROPONINI in the last 168 hours. CBG: Recent Labs  Lab 02/09/20 2119 02/10/20 1141 02/10/20 1638 02/10/20 2110 02/11/20 0615  GLUCAP 115* 113* 123* 119* 90    Iron Studies: No results for input(s): IRON, TIBC, TRANSFERRIN, FERRITIN in the last 72 hours. Studies/Results: No results found.  Medications: Infusions:  sodium chloride     sodium chloride     sodium chloride 10 mL/hr at 02/11/20 0608    Scheduled Medications:  amiodarone  200 mg Oral Daily   brimonidine  1 drop Right Eye TID   brinzolamide  1 drop Right Eye TID   diltiazem  360 mg Oral Daily   heparin injection (subcutaneous)  5,000 Units Subcutaneous Q8H   latanoprost  1 drop Both Eyes QHS   medroxyPROGESTERone  20 mg Oral Daily   mometasone-formoterol  2 puff Inhalation BID   potassium chloride  40 mEq Oral TID   sodium chloride flush  3 mL  Intravenous Q12H   sodium chloride flush  3 mL Intravenous Q12H    have reviewed scheduled and prn medications.  Physical Exam: General:NAD, comfortable Heart:RRR, s1s2 nl Lungs:clear b/l, no crackle Abdomen:soft, Non-tender, non-distended Extremities:No LE edema Neurology: Alert awake and following commands.  No asterixis  Senna Lape Prasad Demoni Gergen 02/11/2020,11:17 AM  LOS: 8 days  Pager: [phone]

## 2020-02-12 LAB — GLUCOSE, CAPILLARY
Glucose-Capillary: 96 mg/dL (ref 70–99)
Glucose-Capillary: 106 mg/dL — ABNORMAL HIGH (ref 70–99)
Glucose-Capillary: 110 mg/dL — ABNORMAL HIGH (ref 70–99)
Glucose-Capillary: 118 mg/dL — ABNORMAL HIGH (ref 70–99)

## 2020-02-12 LAB — RENAL FUNCTION PANEL
Albumin: 2.7 g/dL — ABNORMAL LOW (ref 3.5–5.0)
Anion gap: 10 (ref 5–15)
BUN: 30 mg/dL — ABNORMAL HIGH (ref 8–23)
CO2: 26 mmol/L (ref 22–32)
Calcium: 8.3 mg/dL — ABNORMAL LOW (ref 8.9–10.3)
Chloride: 103 mmol/L (ref 98–111)
Creatinine, Ser: 3.7 mg/dL — ABNORMAL HIGH (ref 0.44–1.00)
GFR calc Af Amer: 13 mL/min — ABNORMAL LOW (ref >60)
GFR calc non Af Amer: 11 mL/min — ABNORMAL LOW (ref >60)
Glucose, Bld: 104 mg/dL — ABNORMAL HIGH (ref 70–99)
Phosphorus: 3.7 mg/dL (ref 2.5–4.6)
Potassium: 3.8 mmol/L (ref 3.5–5.1)
Sodium: 139 mmol/L (ref 135–145)

## 2020-02-12 MED ORDER — FUROSEMIDE 10 MG/ML IJ SOLN
40.0000 mg | Freq: Two times a day (BID) | INTRAMUSCULAR | Status: DC
  Administered 2020-02-12 – 2020-02-13 (×2): 40 mg via INTRAVENOUS
  Filled 2020-02-12 (×2): qty 4

## 2020-02-12 NOTE — Progress Notes (Signed)
Progress Note  Patient Name: Jackie Parker Date of Encounter: 02/12/2020  Primary Cardiologist:   Sinclair Grooms, MD   Subjective   She is breathing OK but she does not think that it is appreciably better than on admission.  She is comfortable on RA seated.  She reports that she ambulated.    Inpatient Medications    Scheduled Meds: . amiodarone  200 mg Oral Daily  . brimonidine  1 drop Right Eye TID  . brinzolamide  1 drop Right Eye TID  . diltiazem  360 mg Oral Daily  . furosemide  60 mg Intravenous Q12H  . heparin  5,000 Units Subcutaneous Q8H  . latanoprost  1 drop Both Eyes QHS  . medroxyPROGESTERone  20 mg Oral Daily  . mometasone-formoterol  2 puff Inhalation BID  . potassium chloride  40 mEq Oral Daily  . sodium chloride flush  3 mL Intravenous Q12H  . sodium chloride flush  3 mL Intravenous Q12H  . sodium chloride flush  3 mL Intravenous Q12H   Continuous Infusions: . sodium chloride    . sodium chloride     PRN Meds: sodium chloride, sodium chloride, acetaminophen, albuterol, allopurinol, fluocinonide ointment, ondansetron, sodium chloride flush, sodium chloride flush   Vital Signs    Vitals:   02/11/20 1826 02/11/20 2036 02/12/20 0016 02/12/20 0439  BP: 116/66 (!) 109/54 110/66 121/65  Pulse: 77 82 83 78  Resp: 17 19 17 18   Temp: 98 F (36.7 C) 98.7 F (37.1 C) 98.7 F (37.1 C) 99.1 F (37.3 C)  TempSrc: Oral Oral Oral Oral  SpO2: 97% 100% 96% 99%  Weight:    113.5 kg  Height:        Intake/Output Summary (Last 24 hours) at 02/12/2020 4128 Last data filed at 02/12/2020 0600 Gross per 24 hour  Intake 400.87 ml  Output 800 ml  Net -399.13 ml   Filed Weights   02/10/20 0059 02/11/20 0451 02/12/20 0439  Weight: 113 kg 113.4 kg 113.5 kg    Telemetry    NA  - Personally Reviewed  ECG    NA - Personally Reviewed  Physical Exam   GEN: No  acute distress.   Neck: No  JVD Cardiac: RRR, no murmurs, rubs, or gallops.  Respiratory:     Mild decreased breath sounds GI: Soft, nontender, non-distended, normal bowel sounds  MS:  No edema; No deformity. Neuro:   Nonfocal  Psych: Oriented and appropriate    Labs    Chemistry Recent Labs  Lab 02/09/20 0630 02/09/20 0630 02/10/20 0452 02/11/20 0453 02/11/20 1706  NA 141   < > 142 141 142  K 3.2*   < > 3.4* 4.3 4.3  CL 107  --  107 105  --   CO2 27  --  26 22  --   GLUCOSE 83  --  96 92  --   BUN 18  --  20 21  --   CREATININE 2.85*  --  3.05* 3.37*  --   CALCIUM 7.9*  --  7.9* 8.0*  --   GFRNONAA 15*  --  14* 12*  --   GFRAA 18*  --  16* 14*  --   ANIONGAP 7  --  9 14  --    < > = values in this interval not displayed.     Hematology Recent Labs  Lab 02/11/20 0453 02/11/20 1706  WBC 8.5  --   RBC 3.71*  --  HGB 11.1* 13.3  HCT 34.9* 39.0  MCV 94.1  --   MCH 29.9  --   MCHC 31.8  --   RDW 17.2*  --   PLT 139*  --     Cardiac EnzymesNo results for input(s): TROPONINI in the last 168 hours. No results for input(s): TROPIPOC in the last 168 hours.   BNPNo results for input(s): BNP, PROBNP in the last 168 hours.   DDimer No results for input(s): DDIMER in the last 168 hours.   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  1. Moderate pulmonary venous hypertension. Mean PAP 40 mm Hg 2. Elevated LV filling pressures. Mean PCWP 30 mm Hg with prominent V wave to 54 mm Hg 3. Good cardiac output. Index 4.95. Plan: continue IV diuresis.    Cardiac Studies   ECHO:  1. Left ventricular ejection fraction, by estimation, is 60 to 65%. The  left ventricle has normal function. The left ventricle has no regional  wall motion abnormalities. Left ventricular diastolic function could not  be evaluated.  2. Right ventricular systolic function is normal. The right ventricular  size is normal. There is mildly elevated pulmonary artery systolic  pressure. The estimated  right ventricular systolic pressure is 16.1 mmHg.  3. The mitral valve is degenerative. Trivial mitral valve regurgitation.  No evidence of mitral stenosis.  4. The aortic valve is tricuspid. Aortic valve regurgitation is not  visualized. Mild aortic valve stenosis. Aortic valve area, by VTI measures  1.61 cm. Aortic valve mean gradient measures 12.6 mmHg. Aortic valve Vmax  measures 2.53 m/s.  5. The inferior vena cava is dilated in size with >50% respiratory  variability, suggesting right atrial pressure of 8 mmHg.    Right Heart Cath  Fick Cardiac Output 9.12 L/min  Fick Cardiac Output Index 4.35 (L/min)/BSA  RA A Wave -99 mmHg  RA V Wave 28 mmHg  RA Mean 21 mmHg  RV Systolic Pressure 69 mmHg  RV Diastolic Pressure 6 mmHg  RV EDP 22 mmHg  PA Systolic Pressure 70 mmHg  PA Diastolic Pressure 26 mmHg  PA Mean 41 mmHg  PW A Wave -99 mmHg  PW V Wave 54 mmHg  PW Mean 30 mmHg  QP/QS 1  TPVR Index 9.2 HRUI   1. Moderate pulmonary venous hypertension. Mean PAP 40 mm Hg 2. Elevated LV filling pressures. Mean PCWP 30 mm Hg with prominent V wave to 54 mm Hg 3. Good cardiac output. Index 4.95.   Patient Profile     79 y.o. female  with diastolic HF, CKD, persistent Afib, Asthma, obesity (BMI 45), admitted 02/03/2020 for acute diastolic HF.   Assessment & Plan    Acute diastolic HF:   Right heart cath data as above. Elevated EDP.  I would like to diurese again today pending the creat.  If it is relatively stable I would give another 40 mg bid IV of Lasix given elevated EDP (wedge).   Net negative 9.2 liters.   Likely home in AM.   AKI:    Creat is pending for today.    Atrial fib:    Paroxysmal.   Maintaining NSR.   HTN:    BP is well controlled.  No change in therapy.    For questions or updates, please contact [place] Please consult [email or web address] for contact info under Cardiology/STEMI.   Signed, Minus Breeding, MD  02/12/2020, 7:22 AM

## 2020-02-12 NOTE — Progress Notes (Signed)
[facility] NEPHROLOGY PROGRESS NOTE  Assessment/ Plan: Pt is a 79 y.o. yo female with history of CHF, CKD, A. fib, asthma, obesity, admitted on 5/24 for acute CHF exacerbation.  #Acute kidney injury on CKD 3, nonoliguric: Baseline creatinine around 2.  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  No obstruction and AKI probably cardiorenal syndrome.   RHC on 6/1 with increased EDV, resumed IV Lasix by cardiology.  No IV contrast used. Pending lab results today, renal panel ordered.   #Acute on chronic CHF: Cardiology following.  Had a right heart cath and now on diuretics as above.  #Atrial fibrillation: On amiodarone.  #Hypertension: Blood pressure acceptable.  #Hypokalemia: Potassium acceptable.  On KCl repletion.  Subjective: Seen and examined at bedside.  Sitting on chair comfortable.  Denies nausea vomiting chest pain shortness of breath.  Objective Vital signs in last 24 hours: Vitals:   02/11/20 2036 02/12/20 0016 02/12/20 0439 02/12/20 0724  BP: (!) 109/54 110/66 121/65   Pulse: 82 83 78   Resp: 19 17 18    Temp: 98.7 F (37.1 C) 98.7 F (37.1 C) 99.1 F (37.3 C)   TempSrc: Oral Oral Oral   SpO2: 100% 96% 99% 98%  Weight:   113.5 kg   Height:       Weight change: 0.091 kg  Intake/Output Summary (Last 24 hours) at 02/12/2020 1035 Last data filed at 02/12/2020 0700 Gross per 24 hour  Intake 480.87 ml  Output 800 ml  Net -319.13 ml       Labs: Basic Metabolic Panel: Recent Labs  Lab 02/09/20 0630 02/09/20 0630 02/10/20 0452 02/11/20 0453 02/11/20 1706  NA 141   < > 142 141 142  K 3.2*   < > 3.4* 4.3 4.3  CL 107  --  107 105  --   CO2 27  --  26 22  --   GLUCOSE 83  --  96 92  --   BUN 18  --  20 21  --   CREATININE 2.85*  --  3.05* 3.37*  --   CALCIUM 7.9*  --  7.9* 8.0*  --    < > = values in this interval not displayed.   Liver Function Tests: No results for input(s): AST, ALT, ALKPHOS, BILITOT, PROT, ALBUMIN in the last 168 hours. No  results for input(s): LIPASE, AMYLASE in the last 168 hours. No results for input(s): AMMONIA in the last 168 hours. CBC: Recent Labs  Lab 02/11/20 0453 02/11/20 1706  WBC 8.5  --   HGB 11.1* 13.3  HCT 34.9* 39.0  MCV 94.1  --   PLT 139*  --    Cardiac Enzymes: No results for input(s): CKTOTAL, CKMB, CKMBINDEX, TROPONINI in the last 168 hours. CBG: Recent Labs  Lab 02/11/20 0615 02/11/20 1121 02/11/20 1620 02/11/20 2110 02/12/20 0614  GLUCAP 90 87 85 167* 96    Iron Studies: No results for input(s): IRON, TIBC, TRANSFERRIN, FERRITIN in the last 72 hours. 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  1. Moderate pulmonary venous hypertension. Mean PAP 40 mm Hg 2. Elevated LV filling pressures. Mean PCWP 30 mm Hg with prominent V wave to 54 mm Hg 3. Good cardiac output. Index 4.95. Plan: continue IV diuresis.    Medications: Infusions: . sodium chloride    . sodium chloride      Scheduled Medications: . amiodarone  200 mg Oral Daily  . brimonidine  1 drop  Right Eye TID  . brinzolamide  1 drop Right Eye TID  . diltiazem  360 mg Oral Daily  . furosemide  60 mg Intravenous Q12H  . heparin  5,000 Units Subcutaneous Q8H  . latanoprost  1 drop Both Eyes QHS  . medroxyPROGESTERone  20 mg Oral Daily  . mometasone-formoterol  2 puff Inhalation BID  . potassium chloride  40 mEq Oral Daily  . sodium chloride flush  3 mL Intravenous Q12H  . sodium chloride flush  3 mL Intravenous Q12H  . sodium chloride flush  3 mL Intravenous Q12H    have reviewed scheduled and prn medications.  Physical Exam: General:NAD, comfortable Heart:RRR, s1s2 nl Lungs: Clear b/l, no crackle Abdomen:soft, Non-tender, non-distended Extremities:nonpitting LE edema, chronic skin changes. Neurology: Alert awake and following commands.  No asterixis  Chester Romero Prasad Fowler Antos 02/12/2020,10:35 AM  LOS: 9 days   Pager: [phone]

## 2020-02-12 NOTE — Progress Notes (Signed)
Patient refusing CPAP.  Machine removed from room.  Patient is compliant with CPAP at home but cannot tolerate our hospital equipment.

## 2020-02-13 ENCOUNTER — Telehealth: Payer: Self-pay

## 2020-02-13 ENCOUNTER — Other Ambulatory Visit: Payer: Self-pay | Admitting: Medical

## 2020-02-13 DIAGNOSIS — N183 Chronic kidney disease, stage 3 unspecified: Secondary | ICD-10-CM

## 2020-02-13 DIAGNOSIS — I5031 Acute diastolic (congestive) heart failure: Secondary | ICD-10-CM

## 2020-02-13 LAB — BASIC METABOLIC PANEL
Anion gap: 9 (ref 5–15)
BUN: 32 mg/dL — ABNORMAL HIGH (ref 8–23)
CO2: 25 mmol/L (ref 22–32)
Calcium: 7.9 mg/dL — ABNORMAL LOW (ref 8.9–10.3)
Chloride: 102 mmol/L (ref 98–111)
Creatinine, Ser: 3.92 mg/dL — ABNORMAL HIGH (ref 0.44–1.00)
GFR calc Af Amer: 12 mL/min — ABNORMAL LOW (ref >60)
GFR calc non Af Amer: 10 mL/min — ABNORMAL LOW (ref >60)
Glucose, Bld: 101 mg/dL — ABNORMAL HIGH (ref 70–99)
Potassium: 3.8 mmol/L (ref 3.5–5.1)
Sodium: 136 mmol/L (ref 135–145)

## 2020-02-13 LAB — GLUCOSE, CAPILLARY
Glucose-Capillary: 87 mg/dL (ref 70–99)
Glucose-Capillary: 90 mg/dL (ref 70–99)
Glucose-Capillary: 115 mg/dL — ABNORMAL HIGH (ref 70–99)
Glucose-Capillary: 154 mg/dL — ABNORMAL HIGH (ref 70–99)

## 2020-02-13 MED ORDER — FUROSEMIDE 40 MG PO TABS
40.0000 mg | ORAL_TABLET | Freq: Two times a day (BID) | ORAL | 2 refills | Status: DC
Start: 2020-02-13 — End: 2020-02-14

## 2020-02-13 NOTE — Telephone Encounter (Signed)
**Note De-Identified  Obfuscation** The pt is currently in [facility]. We will monitor pts chart and call once she has been discharged.

## 2020-02-13 NOTE — Discharge Summary (Deleted)
Discharge Summary    Patient ID: Jackie Parker MRN: [id number]; DOB: 12/02/1940  Admit date: 02/03/2020 Discharge date: 02/14/2020  Primary Care Provider: [name], MD  Primary Cardiologist: Sinclair Grooms, MD  Primary Electrophysiologist:  Thompson Grayer, MD   Discharge Diagnoses    Principal Problem:   Acute on chronic diastolic (congestive) heart failure Jackie Parker) Active Problems:   HYPOKALEMIA   Essential hypertension   AKI (acute kidney injury) ([place])   Persistent atrial fibrillation (HCC)   CKD (chronic kidney disease) stage 3, GFR 30-59 ml/min   Diagnostic Studies/Procedures    Cardiac cath 02/11/20  Hemodynamic findings consistent with moderate pulmonary hypertension.   1. Moderate pulmonary venous hypertension. Mean PAP 40 mm Hg 2. Elevated LV filling pressures. Mean PCWP 30 mm Hg with prominent V wave to 54 mm Hg 3. Good cardiac output. Index 4.95.   Plan: continue IV diuresis.  Echo 02/05/20  1. Left ventricular ejection fraction, by estimation, is 60 to 65%. The  left ventricle has normal function. The left ventricle has no regional  wall motion abnormalities. Left ventricular diastolic function could not  be evaluated.   2. Right ventricular systolic function is normal. The right ventricular  size is normal. There is mildly elevated pulmonary artery systolic  pressure. The estimated right ventricular systolic pressure is 54.5 mmHg.   3. The mitral valve is degenerative. Trivial mitral valve regurgitation.  No evidence of mitral stenosis.   4. The aortic valve is tricuspid. Aortic valve regurgitation is not  visualized. Mild aortic valve stenosis. Aortic valve area, by VTI measures  1.61 cm. Aortic valve mean gradient measures 12.6 mmHg. Aortic valve Vmax  measures 2.53 m/s.   5. The inferior vena cava is dilated in size with >50% respiratory  variability, suggesting right atrial pressure of 8 mmHg.  _____________   History of Present Illness       Jackie Parker is a 79 y.o. female with pmh of persistent afib on amiodarone and no a/c due to uterine bleeding (followed by OBGYN), HTN, ulcerative colitis s/p colectomy 1977, OA s/p TKR, chronic hypokalemia, psoriasis, asthma who was admitted from the cardiology office 02/03/20 for fluid overload. She arrived in a wheelchair and noted significant dyspnea on exertion as well as at rest. Denied chest pain. Reported orthopnea. She also felt her abdomen was swollen. She reported weight gain of at least 50lbs since November 2020. On exam she was severely volume overloaded with JVD to the jaw and severe edema. The case was discussed with MD who agreed and the patient was admitted.  Hospital Course     Consultants: Nephrology  On admission the patient was started on IV lasix 60 mg BID. Potassium was increased to 8mq TID. Patient was in sinus, amiodarone was continued. No anticoagulation due to bleeding risk however patient reported since uterine bleeding stopped she might be able to start anticoagulation. Diltiazem was continued for rate control and blood pressure. BNP found to be elevated to 222. CXR showed slight left base atelectasis. Echo was repeated which showed LVEF 60-65%, normal RV function, mildly elevated pulmonary artery systolic pressure estimated at 417mg, degenerative MV, trivial TR, mild AS, mean gradient 12.5636m, dilated IVC. Creatinine was up around 3 with a baseline 1.7-2. Also the fact that BNP up only to 222 suggested renal failure was contributing to fluid overload. Nephrology was consulted for AKI on CKD and volume overload. CT from 5/12 was reviewed which showed possible uterine mass and simple cyst, normal kidneys without  obstruction. Continued diuresis was recommended by nephrology. RRT was discussed as well. Pyuria was noted on UA however patient was asymptomatic. Patient had good urine output however was still very volume overloaded and Lasix was increased to 115m BID. Urine  output was low so lasix was increased again to 1637mBID and metolazone was added. Creatinine started improving.  Patient was set up for RHC and diuretic was held. On 6/1 she underwent RHC which showed moderate pulmonary venous HTN, mean PAP 40 mmHg, elevated filling pressures, mean PCWP 30 mmHg with prominent V wave to 54 mmHg, good cardiac output. Plan was to switch to oral diuretic post cath but given elevated EDP she was given IV lasix 4011mID. Overall the patient put out 9L urine. Weights did not appear to be accurately documented (admit weight recorded at 253lbs and discharge weight at 251lbs) . Low salt diet and fluid restriction was discussed. Will plan to discharge the patient on Lasix 40 mg BID with follow-up BMET in 4 days.   The patient was evaluated by Dr. HocPercival Spanish 02/14/20 and felt to be stable for discharge.   Did the patient have an acute coronary syndrome (MI, NSTEMI, STEMI, etc) this admission?:  No                               Did the patient have a percutaneous coronary intervention (stent / angioplasty)?:  No.   _____________  Discharge Vitals Blood pressure (!) 103/56, pulse 81, temperature 98.3 F (36.8 C), temperature source Oral, resp. rate 17, height 5' 2"  (1.575 m), weight 113.9 kg, SpO2 97 %.  Filed Weights   02/12/20 0439 02/13/20 0202 02/14/20 0500  Weight: 113.5 kg 113.9 kg 113.9 kg    Labs & Radiologic Studies    CBC Recent Labs    02/11/20 1706  HGB 13.3  HCT 39.88.3Basic Metabolic Panel Recent Labs    02/12/20 1052 02/12/20 1052 02/13/20 0348 02/14/20 0448  NA 139   < > 136 138  K 3.8   < > 3.8 3.7  CL 103   < > 102 102  CO2 26   < > 25 26  GLUCOSE 104*   < > 101* 87  BUN 30*   < > 32* 34*  CREATININE 3.70*   < > 3.92* 3.94*  CALCIUM 8.3*   < > 7.9* 7.9*  PHOS 3.7  --   --   --    < > = values in this interval not displayed.   Liver Function Tests Recent Labs    02/12/20 1052  ALBUMIN 2.7*   No results for input(s): LIPASE, AMYLASE  in the last 72 hours. High Sensitivity Troponin:   No results for input(s): TROPONINIHS in the last 720 hours.  BNP Invalid input(s): POCBNP D-Dimer No results for input(s): DDIMER in the last 72 hours. Hemoglobin A1C No results for input(s): HGBA1C in the last 72 hours. Fasting Lipid Panel No results for input(s): CHOL, HDL, LDLCALC, TRIG, CHOLHDL, LDLDIRECT in the last 72 hours. Thyroid Function Tests No results for input(s): TSH, T4TOTAL, T3FREE, THYROIDAB in the last 72 hours.  Invalid input(s): FREET3 _____________  CT Abdomen Pelvis Wo Contrast  Result Date: 01/22/2020 CLINICAL DATA:  [age]ar old female with postmenopausal bleeding. Suspected uterine mass. EXAM: CT ABDOMEN AND PELVIS WITHOUT CONTRAST TECHNIQUE: Multidetector CT imaging of the abdomen and pelvis was performed following the standard protocol without IV contrast. COMPARISON:  No priors. FINDINGS: Lower chest: Mild cardiomegaly. Hepatobiliary: Liver has a shrunken appearance and nodular contour, indicative of underlying cirrhosis. No discrete cystic or solid hepatic lesions are confidently identified on today's noncontrast CT examination. Multiple tiny calcified gallstones lie dependently in the gallbladder. No findings to suggest an acute cholecystitis at this time. Pancreas: No definite pancreatic mass or peripancreatic fluid collections or inflammatory changes are confidently identified on today's noncontrast CT examination. Spleen: Unremarkable. Adrenals/Urinary Tract: In the anterior aspect of the interpolar region of the left kidney there is an exophytic 1.6 cm low-attenuation lesion which is incompletely characterized on today's non-contrast CT examination, but statistically likely to represent a cyst. Right kidney and bilateral adrenal glands are unremarkable in appearance. No hydroureteronephrosis. Urinary bladder is unremarkable in appearance. Stomach/Bowel: Unenhanced appearance of the stomach is normal. No pathologic  dilatation of small bowel. Right lower quadrant ileostomy. Status post total colectomy. Vascular/Lymphatic: Atherosclerotic calcifications of the abdominal aorta and pelvic vasculature. No lymphadenopathy noted in the abdomen or pelvis. Reproductive: Uterus is enlarged measuring 14.0 x 7.8 x 9.2 cm. No discrete uterine mass confidently identified on today's noncontrast CT. However, there is a large amount of gas within the endometrial canal. Unenhanced appearance of the ovaries is unremarkable. Other: No significant volume of ascites.  No pneumoperitoneum. Musculoskeletal: Interbody cage at L4-L5. There are no aggressive appearing lytic or blastic lesions noted in the visualized portions of the skeleton. IMPRESSION: 1. Large amount of gas in the endometrial canal. This is of uncertain etiology and significance, but could be related to cervical incompetence following prior dilatation and curettage-procedure 08/05/2019. However, clinical correlation to exclude signs and symptoms of endometritis recommended. 2. Morphologic changes in the liver indicative of cirrhosis. 3. Cholelithiasis without evidence of acute cholecystitis at this time. 4. Aortic atherosclerosis. 5. Mild cardiomegaly. 6. Additional incidental findings, as above. Electronically Signed   By: Vinnie Langton M.D.   On: 01/22/2020 13:55   DG Chest 2 View  Result Date: 02/04/2020 CLINICAL DATA:  Volume overload EXAM: CHEST - 2 VIEW COMPARISON:  November 27, 2019 FINDINGS: There is slight left base atelectasis. Lungs elsewhere clear. Heart is mildly enlarged with pulmonary vascularity normal. No adenopathy. There is degenerative change in the thoracic spine. There is postoperative change in the lower cervical region. IMPRESSION: Slight left base atelectasis. Lungs elsewhere clear. Heart mildly enlarged. No adenopathy. Electronically Signed   By: Lowella Grip III M.D.   On: 02/04/2020 08:44   CARDIAC CATHETERIZATION  Result Date: 02/11/2020   Hemodynamic findings consistent with moderate pulmonary hypertension.  1. Moderate pulmonary venous hypertension. Mean PAP 40 mm Hg 2. Elevated LV filling pressures. Mean PCWP 30 mm Hg with prominent V wave to 54 mm Hg 3. Good cardiac output. Index 4.95. Plan: continue IV diuresis.   ECHOCARDIOGRAM COMPLETE  Result Date: 02/05/2020    ECHOCARDIOGRAM REPORT   Patient Name:   MIKITA LESMEISTER Date of Exam: 02/05/2020 Medical Rec #:  [id number]        Height:       62.0 in Accession #:    [id number]       Weight:       250.3 lb Date of Birth:  [birth year]        BSA:          2.103 m Patient Age:    10 years         BP:           127/66 mmHg Patient Gender: F  HR:           88 bpm. Exam Location:  Inpatient Procedure: 2D Echo, Cardiac Doppler and Color Doppler Indications:    CHF-Acute Diastolic 341.93 / X90.24  History:        Patient has prior history of Echocardiogram examinations, most                 recent 08/02/2019. CHF, Arrythmias:PVC, PAC and Atrial                 Fibrillation; Risk Factors:Sleep Apnea, Hypertension and                 Non-Smoker.  Sonographer:    Vickie Epley RDCS Referring Phys: [place]  1. Left ventricular ejection fraction, by estimation, is 60 to 65%. The left ventricle has normal function. The left ventricle has no regional wall motion abnormalities. Left ventricular diastolic function could not be evaluated.  2. Right ventricular systolic function is normal. The right ventricular size is normal. There is mildly elevated pulmonary artery systolic pressure. The estimated right ventricular systolic pressure is 09.7 mmHg.  3. The mitral valve is degenerative. Trivial mitral valve regurgitation. No evidence of mitral stenosis.  4. The aortic valve is tricuspid. Aortic valve regurgitation is not visualized. Mild aortic valve stenosis. Aortic valve area, by VTI measures 1.61 cm. Aortic valve mean gradient measures 12.6 mmHg. Aortic valve Vmax measures  2.53 m/s.  5. The inferior vena cava is dilated in size with >50% respiratory variability, suggesting right atrial pressure of 8 mmHg. Comparison(s): No significant change from prior study. EF ~60%. Mild to moderate RVSP 45 mmHG on this study. FINDINGS  Left Ventricle: Left ventricular ejection fraction, by estimation, is 60 to 65%. The left ventricle has normal function. The left ventricle has no regional wall motion abnormalities. The left ventricular internal cavity size was normal in size. There is  no left ventricular hypertrophy. Left ventricular diastolic function could not be evaluated due to atrial fibrillation. Left ventricular diastolic function could not be evaluated. Right Ventricle: The right ventricular size is normal. No increase in right ventricular wall thickness. Right ventricular systolic function is normal. There is mildly elevated pulmonary artery systolic pressure. The tricuspid regurgitant velocity is 3.05  m/s, and with an assumed right atrial pressure of 8 mmHg, the estimated right ventricular systolic pressure is 35.3 mmHg. Left Atrium: Left atrial size was normal in size. Right Atrium: Right atrial size was normal in size. Pericardium: Trivial pericardial effusion is present. Mitral Valve: The mitral valve is degenerative in appearance. There is mild thickening of the mitral valve leaflet(s). There is mild calcification of the mitral valve leaflet(s). Mild mitral annular calcification. Trivial mitral valve regurgitation. No evidence of mitral valve stenosis. Tricuspid Valve: The tricuspid valve is grossly normal. Tricuspid valve regurgitation is mild . No evidence of tricuspid stenosis. Aortic Valve: The aortic valve is tricuspid. Aortic valve regurgitation is not visualized. Mild aortic stenosis is present. Aortic valve mean gradient measures 12.6 mmHg. Aortic valve peak gradient measures 25.7 mmHg. Aortic valve area, by VTI measures 1.61 cm. Pulmonic Valve: The pulmonic valve was  grossly normal. Pulmonic valve regurgitation is trivial. No evidence of pulmonic stenosis. Aorta: The aortic root is normal in size and structure. Venous: The inferior vena cava is dilated in size with greater than 50% respiratory variability, suggesting right atrial pressure of 8 mmHg. IAS/Shunts: The atrial septum is grossly normal.  LEFT VENTRICLE PLAX 2D LVIDd:  5.46 cm LVIDs:         3.80 cm LV PW:         0.66 cm LV IVS:        0.64 cm LVOT diam:     2.00 cm LV SV:         71 LV SV Index:   34 LVOT Area:     3.14 cm  LV Volumes (MOD) LV vol d, MOD A2C: 110.0 ml LV vol d, MOD A4C: 112.0 ml LV vol s, MOD A2C: 50.9 ml LV vol s, MOD A4C: 44.3 ml LV SV MOD A2C:     59.1 ml LV SV MOD A4C:     112.0 ml LV SV MOD BP:      63.3 ml RIGHT VENTRICLE TAPSE (M-mode): 1.8 cm LEFT ATRIUM             Index       RIGHT ATRIUM           Index LA diam:        3.80 cm 1.81 cm/m  RA Area:     12.60 cm LA Vol (A2C):   37.9 ml 18.02 ml/m RA Volume:   26.80 ml  12.74 ml/m LA Vol (A4C):   22.4 ml 10.65 ml/m LA Biplane Vol: 31.6 ml 15.03 ml/m  AORTIC VALVE AV Area (Vmax):    1.51 cm AV Area (Vmean):   1.76 cm AV Area (VTI):     1.61 cm AV Vmax:           253.48 cm/s AV Vmean:          166.083 cm/s AV VTI:            0.442 m AV Peak Grad:      25.7 mmHg AV Mean Grad:      12.6 mmHg LVOT Vmax:         122.00 cm/s LVOT Vmean:        93.300 cm/s LVOT VTI:          0.227 m LVOT/AV VTI ratio: 0.51  AORTA Ao Root diam: 2.70 cm MR Peak grad: 77.1 mmHg   TRICUSPID VALVE MR Vmax:      439.00 cm/s TR Peak grad:   37.2 mmHg                           TR Vmax:        305.00 cm/s                            SHUNTS                           Systemic VTI:  0.23 m                           Systemic Diam: 2.00 cm Eleonore Chiquito MD Electronically signed by Eleonore Chiquito MD Signature Date/Time: 02/05/2020/12:38:02 PM    Final    Disposition   Pt is being discharged home today in good condition.  Follow-up Plans & Appointments     Follow-up Information    Tommie Raymond, NP Follow up on 02/25/2020.   Specialty: Cardiology Why: @ 11:15AM Contact information: [address] [place] [address] [phone]        [place]  Follow up on 02/17/2020.   Specialty: Cardiology Why: Please go to the office for bloodwork 7:30AM to 4:30 PM Contact information: [address], [place] [phone]       [name], MD. Go on 03/17/2020.   Specialty: Family Medicine Why: @9 :[place] information: [place] [address] [phone]          Discharge Instructions    ([place]) Call MD:  Anytime you have any of the following symptoms: 1) 3 pound weight gain in 24 hours or 5 pounds in 1 week 2) shortness of breath, with or without a dry hacking cough 3) swelling in the hands, feet or stomach 4) if you have to sleep on extra pillows at night in order to breathe.   Complete by: As directed    (HEART FAILURE PATIENTS) Call MD:  Anytime you have any of the following symptoms: 1) 3 pound weight gain in 24 hours or 5 pounds in 1 week 2) shortness of breath, with or without a dry hacking cough 3) swelling in the hands, feet or stomach 4) if you have to sleep on extra pillows at night in order to breathe.   Complete by: As directed    Avoid straining   Complete by: As directed    Avoid straining   Complete by: As directed    Diet - low sodium heart healthy   Complete by: As directed    Heart Failure patients record your daily weight using the same scale at the same time of day   Complete by: As directed    Heart Failure patients record your daily weight using the same scale at the same time of day   Complete by: As directed    Increase activity slowly   Complete by: As directed    Increase activity slowly   Complete by: As directed    STOP any activity that causes chest pain, shortness of breath,  dizziness, sweating, or exessive weakness   Complete by: As directed    STOP any activity that causes chest pain, shortness of breath, dizziness, sweating, or exessive weakness   Complete by: As directed       Discharge Medications   Allergies as of 02/14/2020      Reactions   Megace Es [megestrol Acetate] Shortness Of Breath   Sulfonamide Derivatives Rash      Medication List    TAKE these medications   albuterol (2.5 MG/3ML) 0.083% nebulizer solution Commonly known as: PROVENTIL Take 2.5 mg by nebulization 2 (two) times daily as needed for wheezing or shortness of breath.   ProAir HFA 108 (90 Base) MCG/ACT inhaler Generic drug: albuterol Inhale 1 puff into the lungs every 6 (six) hours as needed for wheezing or shortness of breath.   allopurinol 100 MG tablet Commonly known as: ZYLOPRIM TAKE 1 TABLET(100 MG) BY MOUTH DAILY What changed: See the new instructions.   amiodarone 200 MG tablet Commonly known as: PACERONE Take 1 tablet (200 mg total) by mouth daily.   brimonidine 0.2 % ophthalmic solution Commonly known as: ALPHAGAN Place 1 drop into the right eye 3 (three) times daily.   brinzolamide 1 % ophthalmic suspension Commonly known as: AZOPT Place 1 drop into the right eye 3 (three) times daily.   colchicine 0.6 MG tablet Take 2 tabs (1.2 mg) at the onset of a gout attack, may repeat 1 tab (0.6 mg) 2 hours later if symptoms persist.  diltiazem 360 MG 24 hr capsule Commonly known as: Cardizem CD Take 1 capsule (360 mg total) by mouth daily.   fluocinonide ointment 0.05 % Commonly known as: LIDEX Apply 1 application topically 2 (two) times daily as needed for itching.   Fluticasone-Salmeterol 500-50 MCG/DOSE Aepb Commonly known as: Advair Diskus Inhale 1 puff into the lungs in the morning and at bedtime.   furosemide 40 MG tablet Commonly known as: LASIX Take 1 tablet (40 mg total) by mouth 2 (two) times daily. What changed: when to take this     Lumigan 0.01 % Soln Generic drug: bimatoprost Place 1 drop into both eyes at bedtime.   medroxyPROGESTERone 10 MG tablet Commonly known as: PROVERA Take 2 tablets (20 mg total) by mouth daily.   potassium chloride 10 MEQ tablet Commonly known as: KLOR-CON Take 4 tablets (40 mEq total) by mouth 2 (two) times daily for 5 days. What changed: how much to take   prochlorperazine 10 MG tablet Commonly known as: COMPAZINE TAKE 1 TABLET(10 MG) BY MOUTH TWICE DAILY AS NEEDED FOR NAUSEA OR VOMITING What changed: See the new instructions.          Outstanding Labs/Studies   BMET in 4 days  Duration of Discharge Encounter   Greater than 30 minutes including physician time.  Signed, Chun Sellen Ninfa Meeker, PA-C 02/14/2020, 8:58 AM  Patient seen and examined.  Plan as discussed in my rounding note for today and outlined above. Katelyne Galster H Darria Corvera  02/14/2020  8:58 AM

## 2020-02-13 NOTE — Progress Notes (Signed)
[facility] NEPHROLOGY PROGRESS NOTE  Assessment/ Plan: Pt is a 79 y.o. yo female with history of CHF, CKD, A. fib, asthma, obesity, admitted on 5/24 for acute CHF exacerbation.  #Acute kidney injury on CKD 3, nonoliguric: Baseline creatinine around 2.  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  No obstruction and AKI probably due to cardiorenal syndrome.   RHC on 6/1 with increased EDV, resumed IV Lasix by cardiology.  No IV contrast used. Rising creatinine 3.92 today likely hemodynamically mediated due to diuretics.  Agree with holding Lasix today.  Noted today's cardiology note, plan to start oral Lasix from tomorrow and repeat lab on Monday.  I will send message to [place] [facility] to arrange for outpatient nephrology follow-up. She is in room air, ambulating and has no features of uremia.   #Acute on chronic CHF: Cardiology following.  Had a right heart cath and now on diuretics as above.  #Atrial fibrillation: On amiodarone.  #Hypertension: Blood pressure acceptable.  #Hypokalemia: Potassium acceptable.  On KCl repletion.  Subjective: Seen and examined at bedside.  Sitting on bed comfortable.  Eager to go home.  Denies nausea vomiting chest pain shortness of breath.  Urine output 825 cc however creatinine level trending up.  Patient told me that she is going home today.   Objective Vital signs in last 24 hours: Vitals:   02/12/20 2052 02/13/20 0202 02/13/20 0524 02/13/20 0742  BP:   (!) 100/54   Pulse:   79   Resp:   18   Temp:   98.5 F (36.9 C)   TempSrc:   Oral   SpO2: 97%  96% 97%  Weight:  113.9 kg    Height:       Weight change: 0.408 kg  Intake/Output Summary (Last 24 hours) at 02/13/2020 1050 Last data filed at 02/13/2020 0900 Gross per 24 hour  Intake 920 ml  Output 825 ml  Net 95 ml       Labs: Basic Metabolic Panel: Recent Labs  Lab 02/11/20 0453 02/11/20 0453 02/11/20 1706 02/12/20 1052 02/13/20 0348  NA 141   < > 142 139 136   K 4.3   < > 4.3 3.8 3.8  CL 105  --   --  103 102  CO2 22  --   --  26 25  GLUCOSE 92  --   --  104* 101*  BUN 21  --   --  30* 32*  CREATININE 3.37*  --   --  3.70* 3.92*  CALCIUM 8.0*  --   --  8.3* 7.9*  PHOS  --   --   --  3.7  --    < > = values in this interval not displayed.   Liver Function Tests: Recent Labs  Lab 02/12/20 1052  ALBUMIN 2.7*   No results for input(s): LIPASE, AMYLASE in the last 168 hours. No results for input(s): AMMONIA in the last 168 hours. CBC: Recent Labs  Lab 02/11/20 0453 02/11/20 1706  WBC 8.5  --   HGB 11.1* 13.3  HCT 34.9* 39.0  MCV 94.1  --   PLT 139*  --    Cardiac Enzymes: No results for input(s): CKTOTAL, CKMB, CKMBINDEX, TROPONINI in the last 168 hours. CBG: Recent Labs  Lab 02/12/20 0614 02/12/20 1111 02/12/20 1652 02/12/20 2053 02/13/20 0620  GLUCAP 96 106* 110* 118* 87    Iron Studies: No results for input(s): IRON, TIBC, TRANSFERRIN, FERRITIN in the last 72 hours. Studies/Results: CARDIAC  CATHETERIZATION  Result Date: 02/11/2020  Hemodynamic findings consistent with moderate pulmonary hypertension.  1. Moderate pulmonary venous hypertension. Mean PAP 40 mm Hg 2. Elevated LV filling pressures. Mean PCWP 30 mm Hg with prominent V wave to 54 mm Hg 3. Good cardiac output. Index 4.95. Plan: continue IV diuresis.    Medications: Infusions: . sodium chloride    . sodium chloride      Scheduled Medications: . amiodarone  200 mg Oral Daily  . brimonidine  1 drop Right Eye TID  . brinzolamide  1 drop Right Eye TID  . diltiazem  360 mg Oral Daily  . heparin  5,000 Units Subcutaneous Q8H  . latanoprost  1 drop Both Eyes QHS  . medroxyPROGESTERone  20 mg Oral Daily  . mometasone-formoterol  2 puff Inhalation BID  . potassium chloride  40 mEq Oral Daily  . sodium chloride flush  3 mL Intravenous Q12H  . sodium chloride flush  3 mL Intravenous Q12H  . sodium chloride flush  3 mL Intravenous Q12H    have reviewed  scheduled and prn medications.  Physical Exam: General:NAD, sitting on bed comfortable Heart:RRR, s1s2 nl Lungs: Clear b/l, no crackle Abdomen:soft, Non-tender, non-distended Extremities:nonpitting LE edema, chronic skin changes. Neurology: Alert awake and following commands.  No asterixis  Faust Thorington Prasad Marsela Kuan 02/13/2020,10:50 AM  LOS: 10 days  Pager: [phone]

## 2020-02-13 NOTE — Telephone Encounter (Signed)
**Note De-identified  Obfuscation** -----  **Note De-Identified  Obfuscation** Message from Frederik Schmidt, RN sent at 02/13/2020 10:39 AM EDT ----- Regarding: FW: TOC phone call  ----- Message ----- From: Antony Madura, PA-C Sent: 02/13/2020  10:37 AM EDT To: [facility] Triage Subject: TOC phone call                                 Patient is being discharged from the hospital for afib. She has a hospital TOC follow-up 6/11. Can a phone call please be arranged. Thanks!

## 2020-02-13 NOTE — Progress Notes (Signed)
Patient refusing CPAP machine.

## 2020-02-13 NOTE — Progress Notes (Signed)
Progress Note  Patient Name: Jackie Parker Date of Encounter: 02/13/2020  Primary Cardiologist:   Sinclair Grooms, MD   Subjective   She wants to go home and she reports that she does feel better than she did on admission.  She has ambulated and sats are OK on RA although she gets subjective dyspnea.  No PND or orthopnea.  No pain.   Inpatient Medications    Scheduled Meds: . amiodarone  200 mg Oral Daily  . brimonidine  1 drop Right Eye TID  . brinzolamide  1 drop Right Eye TID  . diltiazem  360 mg Oral Daily  . furosemide  40 mg Intravenous Q12H  . heparin  5,000 Units Subcutaneous Q8H  . latanoprost  1 drop Both Eyes QHS  . medroxyPROGESTERone  20 mg Oral Daily  . mometasone-formoterol  2 puff Inhalation BID  . potassium chloride  40 mEq Oral Daily  . sodium chloride flush  3 mL Intravenous Q12H  . sodium chloride flush  3 mL Intravenous Q12H  . sodium chloride flush  3 mL Intravenous Q12H   Continuous Infusions: . sodium chloride    . sodium chloride     PRN Meds: sodium chloride, sodium chloride, acetaminophen, albuterol, allopurinol, fluocinonide ointment, ondansetron, sodium chloride flush, sodium chloride flush   Vital Signs    Vitals:   02/12/20 2052 02/13/20 0202 02/13/20 0524 02/13/20 0742  BP:   (!) 100/54   Pulse:   79   Resp:   18   Temp:   98.5 F (36.9 C)   TempSrc:   Oral   SpO2: 97%  96% 97%  Weight:  113.9 kg    Height:        Intake/Output Summary (Last 24 hours) at 02/13/2020 0810 Last data filed at 02/13/2020 0656 Gross per 24 hour  Intake 700 ml  Output 825 ml  Net -125 ml   Filed Weights   02/11/20 0451 02/12/20 0439 02/13/20 0202  Weight: 113.4 kg 113.5 kg 113.9 kg    Telemetry    NSR - Personally Reviewed  ECG    NA - Personally Reviewed  Physical Exam   GEN: No  acute distress.   Neck: No  JVD Cardiac: RRR, no murmurs, rubs, or gallops.  Respiratory: Clear   to auscultation bilaterally. GI: Soft, nontender,  non-distended, normal bowel sounds  MS:  Moderate leg edema; No deformity. Neuro:   Nonfocal  Psych: Oriented and appropriate    Labs    Chemistry Recent Labs  Lab 02/11/20 0453 02/11/20 0453 02/11/20 1706 02/12/20 1052 02/13/20 0348  NA 141   < > 142 139 136  K 4.3   < > 4.3 3.8 3.8  CL 105  --   --  103 102  CO2 22  --   --  26 25  GLUCOSE 92  --   --  104* 101*  BUN 21  --   --  30* 32*  CREATININE 3.37*  --   --  3.70* 3.92*  CALCIUM 8.0*  --   --  8.3* 7.9*  ALBUMIN  --   --   --  2.7*  --   GFRNONAA 12*  --   --  11* 10*  GFRAA 14*  --   --  13* 12*  ANIONGAP 14  --   --  10 9   < > = values in this interval not displayed.     Hematology Recent Labs  Lab 02/11/20 0453 02/11/20 1706  WBC 8.5  --   RBC 3.71*  --   HGB 11.1* 13.3  HCT 34.9* 39.0  MCV 94.1  --   MCH 29.9  --   MCHC 31.8  --   RDW 17.2*  --   PLT 139*  --     Cardiac EnzymesNo results for input(s): TROPONINI in the last 168 hours. No results for input(s): TROPIPOC in the last 168 hours.   BNPNo results for input(s): BNP, PROBNP in the last 168 hours.   DDimer No results for input(s): DDIMER in the last 168 hours.   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  1. Moderate pulmonary venous hypertension. Mean PAP 40 mm Hg 2. Elevated LV filling pressures. Mean PCWP 30 mm Hg with prominent V wave to 54 mm Hg 3. Good cardiac output. Index 4.95. Plan: continue IV diuresis.    Cardiac Studies   ECHO:  1. Left ventricular ejection fraction, by estimation, is 60 to 65%. The  left ventricle has normal function. The left ventricle has no regional  wall motion abnormalities. Left ventricular diastolic function could not  be evaluated.  2. Right ventricular systolic function is normal. The right ventricular  size is normal. There is mildly elevated pulmonary artery systolic  pressure. The estimated right  ventricular systolic pressure is 00.3 mmHg.  3. The mitral valve is degenerative. Trivial mitral valve regurgitation.  No evidence of mitral stenosis.  4. The aortic valve is tricuspid. Aortic valve regurgitation is not  visualized. Mild aortic valve stenosis. Aortic valve area, by VTI measures  1.61 cm. Aortic valve mean gradient measures 12.6 mmHg. Aortic valve Vmax  measures 2.53 m/s.  5. The inferior vena cava is dilated in size with >50% respiratory  variability, suggesting right atrial pressure of 8 mmHg.    Right Heart Cath  Fick Cardiac Output 9.12 L/min  Fick Cardiac Output Index 4.35 (L/min)/BSA  RA A Wave -99 mmHg  RA V Wave 28 mmHg  RA Mean 21 mmHg  RV Systolic Pressure 69 mmHg  RV Diastolic Pressure 6 mmHg  RV EDP 22 mmHg  PA Systolic Pressure 70 mmHg  PA Diastolic Pressure 26 mmHg  PA Mean 41 mmHg  PW A Wave -99 mmHg  PW V Wave 54 mmHg  PW Mean 30 mmHg  QP/QS 1  TPVR Index 9.2 HRUI   1. Moderate pulmonary venous hypertension. Mean PAP 40 mm Hg 2. Elevated LV filling pressures. Mean PCWP 30 mm Hg with prominent V wave to 54 mm Hg 3. Good cardiac output. Index 4.95.   Patient Profile     79 y.o. female  with diastolic HF, CKD, persistent Afib, Asthma, obesity (BMI 45), admitted 02/03/2020 for acute diastolic HF.   Assessment & Plan    Acute diastolic HF:    With stable renal function I ordered Lasix again yesterday in light of the increased EDP.  I think however we have reached the point of diminishing returns today with the increased in creat and will hold off on further IV diuresis.  She is net negative 9.3 liters.   She is ambulating without O2 and sats are OK.  I think she is going to need close follow up for her multifactorial dypsnea.  We will need to see her early next week and check BMET and volume status.  I would like for her to have follow up with her pulmonologist  and be referred to pulmonary rehab. and she will need to establish with nephrologly.    She needs 2 gm NA diet and 1500 cc fluid restriction.   Send home on 40 mg of Lasix BID but with plans for follow up BMET on Monday.   AKI:    Creat is mildly elevated today.  Follow up as above.   Atrial fib:    Paroxysmal.    Maintaining NSR.   HTN:    BP is well controlled.   (Actually mildly low today.)    For questions or updates, please contact [place] Please consult [email or web address] for contact info under Cardiology/STEMI.   Signed, Minus Breeding, MD  02/13/2020, 8:10 AM

## 2020-02-13 NOTE — Progress Notes (Addendum)
  Mobility Specialist Criteria Algorithm Info.  SATURATION QUALIFICATIONS: (This note is used to comply with regulatory documentation for home oxygen) Patient Saturations on Room Air at Rest = 94% Patient Saturations on Room Air while Ambulating = 95% Patient Saturations on 0 Liters of oxygen while Ambulating = n/a% Please briefly explain why patient needs home oxygen:  Mobility Team: [facility] elevated:Self regulated Activity: Ambulated in hall (In chair before and after ambulation) Range of motion: Active;All extremities Level of assistance: Standby assist, set-up cues, supervision of patient - no hands on Assistive device: None Minutes sitting in chair:  Minutes stood: 5 minutes Minutes ambulated: 5 minutes Distance ambulated (ft): 100 ft Mobility response: Tolerated well;Tolerated fair (Needed one seated rest break. With distances greater than 50' patient gets very dyspneic, wheezing and fatigued requiring seated rest. Patient denied dizziness/lightheadedness or pain. Noted patient having a noticeable gait abnormality favoring the LLE.  Patient explained there was a corn or callus on the bottom/center of the left foot but was not too sure and would like for the nurse to take a look at it. RN notified.) Bed Position: Chair   02/13/2020 10:24 AM

## 2020-02-14 LAB — BASIC METABOLIC PANEL
Anion gap: 10 (ref 5–15)
BUN: 34 mg/dL — ABNORMAL HIGH (ref 8–23)
CO2: 26 mmol/L (ref 22–32)
Calcium: 7.9 mg/dL — ABNORMAL LOW (ref 8.9–10.3)
Chloride: 102 mmol/L (ref 98–111)
Creatinine, Ser: 3.94 mg/dL — ABNORMAL HIGH (ref 0.44–1.00)
GFR calc Af Amer: 12 mL/min — ABNORMAL LOW (ref >60)
GFR calc non Af Amer: 10 mL/min — ABNORMAL LOW (ref >60)
Glucose, Bld: 87 mg/dL (ref 70–99)
Potassium: 3.7 mmol/L (ref 3.5–5.1)
Sodium: 138 mmol/L (ref 135–145)

## 2020-02-14 LAB — GLUCOSE, CAPILLARY
Glucose-Capillary: 92 mg/dL (ref 70–99)
Glucose-Capillary: 120 mg/dL — ABNORMAL HIGH (ref 70–99)

## 2020-02-14 MED ORDER — FUROSEMIDE 40 MG PO TABS
40.0000 mg | ORAL_TABLET | Freq: Two times a day (BID) | ORAL | Status: DC
  Administered 2020-02-14: 40 mg via ORAL
  Filled 2020-02-14: qty 1

## 2020-02-14 MED ORDER — FUROSEMIDE 40 MG PO TABS
40.0000 mg | ORAL_TABLET | Freq: Two times a day (BID) | ORAL | 5 refills | Status: DC
Start: 2020-02-14 — End: 2020-03-05

## 2020-02-14 NOTE — Progress Notes (Signed)
[facility] NEPHROLOGY PROGRESS NOTE  Assessment/ Plan: Pt is a 79 y.o. yo female with history of CHF, CKD, A. fib, asthma, obesity, admitted on 5/24 for acute CHF exacerbation.  #Acute kidney injury on CKD 3, nonoliguric: Baseline creatinine around 2.  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  No obstruction and AKI probably due to cardiorenal syndrome.   RHC on 6/1 with increased EDV, resumed IV Lasix by cardiology.  No IV contrast used. Creatinine level stable around 3.9 today.  Remains nonoliguric.  Noted patient is being discharged today with oral Lasix twice a day and a repeat lab.  Our office will coordinate with the patient to arrange outpatient follow-up at [place] [facility]. She is in room air, ambulating and has no features of uremia.   #Acute on chronic CHF: Cardiology following.  Had a right heart cath and now on diuretics as above.  #Atrial fibrillation: On amiodarone.  #Hypertension: Blood pressure acceptable.  #Hypokalemia: Potassium acceptable.  On KCl repletion.  Subjective: Seen and examined at bedside.  No new event.  Denies nausea vomiting chest pain shortness of breath.  Eager to go home.  Nonoliguric with urine output of 550 cc recorded. Objective Vital signs in last 24 hours: Vitals:   02/14/20 0459 02/14/20 0500 02/14/20 0736 02/14/20 0900  BP: (!) 103/56   (!) 128/58  Pulse: 81   92  Resp: 17   16  Temp: 98.3 F (36.8 C)     TempSrc: Oral   Oral  SpO2: 98%  97%   Weight:  113.9 kg    Height:       Weight change: 0 kg  Intake/Output Summary (Last 24 hours) at 02/14/2020 1311 Last data filed at 02/14/2020 0505 Gross per 24 hour  Intake 323 ml  Output 550 ml  Net -227 ml       Labs: Basic Metabolic Panel: Recent Labs  Lab 02/12/20 1052 02/13/20 0348 02/14/20 0448  NA 139 136 138  K 3.8 3.8 3.7  CL 103 102 102  CO2 26 25 26   GLUCOSE 104* 101* 87  BUN 30* 32* 34*  CREATININE 3.70* 3.92* 3.94*  CALCIUM 8.3* 7.9* 7.9*  PHOS  3.7  --   --    Liver Function Tests: Recent Labs  Lab 02/12/20 1052  ALBUMIN 2.7*   No results for input(s): LIPASE, AMYLASE in the last 168 hours. No results for input(s): AMMONIA in the last 168 hours. CBC: Recent Labs  Lab 02/11/20 0453 02/11/20 1706  WBC 8.5  --   HGB 11.1* 13.3  HCT 34.9* 39.0  MCV 94.1  --   PLT 139*  --    Cardiac Enzymes: No results for input(s): CKTOTAL, CKMB, CKMBINDEX, TROPONINI in the last 168 hours. CBG: Recent Labs  Lab 02/13/20 1135 02/13/20 1628 02/13/20 2216 02/14/20 0620 02/14/20 1054  GLUCAP 90 115* 154* 92 120*    Iron Studies: No results for input(s): IRON, TIBC, TRANSFERRIN, FERRITIN in the last 72 hours. Studies/Results: No results found.  Medications: Infusions: . sodium chloride    . sodium chloride      Scheduled Medications: . amiodarone  200 mg Oral Daily  . brimonidine  1 drop Right Eye TID  . brinzolamide  1 drop Right Eye TID  . diltiazem  360 mg Oral Daily  . furosemide  40 mg Oral BID  . heparin  5,000 Units Subcutaneous Q8H  . indomethacin  25 mg Oral BID WC  . latanoprost  1 drop Both Eyes  QHS  . medroxyPROGESTERone  20 mg Oral Daily  . mometasone-formoterol  2 puff Inhalation BID  . potassium chloride  40 mEq Oral Daily  . sodium chloride flush  3 mL Intravenous Q12H  . sodium chloride flush  3 mL Intravenous Q12H  . sodium chloride flush  3 mL Intravenous Q12H    have reviewed scheduled and prn medications.  Physical Exam: General:NAD, sitting on bed comfortable Heart:RRR, s1s2 nl Lungs: Clear b/l, no crackle Abdomen:soft, Non-tender, non-distended Extremities:nonpitting LE edema, chronic skin changes. Neurology: Alert awake and following commands.  No asterixis  Jackie Parker Tanna Furry 02/14/2020,1:11 PM  LOS: 11 days  Pager: [phone]

## 2020-02-14 NOTE — Progress Notes (Signed)
Progress Note  Patient Name: Jackie Parker Date of Encounter: 02/14/2020  Primary Cardiologist:   Sinclair Grooms, MD   Subjective   She wants to go home.  Ambulated in the hallway and was 95% on room air.  No pain and breathing is improved and likely back to baseline.   Inpatient Medications    Scheduled Meds: . amiodarone  200 mg Oral Daily  . brimonidine  1 drop Right Eye TID  . brinzolamide  1 drop Right Eye TID  . diltiazem  360 mg Oral Daily  . heparin  5,000 Units Subcutaneous Q8H  . latanoprost  1 drop Both Eyes QHS  . medroxyPROGESTERone  20 mg Oral Daily  . mometasone-formoterol  2 puff Inhalation BID  . potassium chloride  40 mEq Oral Daily  . sodium chloride flush  3 mL Intravenous Q12H  . sodium chloride flush  3 mL Intravenous Q12H  . sodium chloride flush  3 mL Intravenous Q12H   Continuous Infusions: . sodium chloride    . sodium chloride     PRN Meds: sodium chloride, sodium chloride, acetaminophen, albuterol, allopurinol, fluocinonide ointment, ondansetron, sodium chloride flush, sodium chloride flush   Vital Signs    Vitals:   02/13/20 2108 02/14/20 0459 02/14/20 0500 02/14/20 0736  BP: 117/68 (!) 103/56    Pulse: 88 81    Resp:  17    Temp:  98.3 F (36.8 C)    TempSrc:  Oral    SpO2:  98%  97%  Weight:   113.9 kg   Height:        Intake/Output Summary (Last 24 hours) at 02/14/2020 0751 Last data filed at 02/14/2020 0505 Gross per 24 hour  Intake 763 ml  Output 550 ml  Net 213 ml   Filed Weights   02/12/20 0439 02/13/20 0202 02/14/20 0500  Weight: 113.5 kg 113.9 kg 113.9 kg    Telemetry    NSR - Personally Reviewed  ECG    NA - Personally Reviewed  Physical Exam   GEN: No  acute distress.   Neck: No  JVD Cardiac: RRR, soft apical systolic murmur, no diastolic murmurs, rubs, or gallops.  Respiratory:    Decreased breath sounds GI: Soft, nontender, non-distended, normal bowel sounds  MS:  Mild edema; No  deformity. Neuro:   Nonfocal  Psych: Oriented and appropriate     Labs    Chemistry Recent Labs  Lab 02/12/20 1052 02/13/20 0348 02/14/20 0448  NA 139 136 138  K 3.8 3.8 3.7  CL 103 102 102  CO2 26 25 26   GLUCOSE 104* 101* 87  BUN 30* 32* 34*  CREATININE 3.70* 3.92* 3.94*  CALCIUM 8.3* 7.9* 7.9*  ALBUMIN 2.7*  --   --   GFRNONAA 11* 10* 10*  GFRAA 13* 12* 12*  ANIONGAP 10 9 10      Hematology Recent Labs  Lab 02/11/20 0453 02/11/20 1706  WBC 8.5  --   RBC 3.71*  --   HGB 11.1* 13.3  HCT 34.9* 39.0  MCV 94.1  --   MCH 29.9  --   MCHC 31.8  --   RDW 17.2*  --   PLT 139*  --     Cardiac EnzymesNo results for input(s): TROPONINI in the last 168 hours. No results for input(s): TROPIPOC in the last 168 hours.   BNPNo results for input(s): BNP, PROBNP in the last 168 hours.   DDimer No results for input(s): DDIMER in  the last 168 hours.   Radiology    No results found.  Cardiac Studies   ECHO:  1. Left ventricular ejection fraction, by estimation, is 60 to 65%. The  left ventricle has normal function. The left ventricle has no regional  wall motion abnormalities. Left ventricular diastolic function could not  be evaluated.  2. Right ventricular systolic function is normal. The right ventricular  size is normal. There is mildly elevated pulmonary artery systolic  pressure. The estimated right ventricular systolic pressure is 88.8 mmHg.  3. The mitral valve is degenerative. Trivial mitral valve regurgitation.  No evidence of mitral stenosis.  4. The aortic valve is tricuspid. Aortic valve regurgitation is not  visualized. Mild aortic valve stenosis. Aortic valve area, by VTI measures  1.61 cm. Aortic valve mean gradient measures 12.6 mmHg. Aortic valve Vmax  measures 2.53 m/s.  5. The inferior vena cava is dilated in size with >50% respiratory  variability, suggesting right atrial pressure of 8 mmHg.    Right Heart Cath  Fick Cardiac Output 9.12  L/min  Fick Cardiac Output Index 4.35 (L/min)/BSA  RA A Wave -99 mmHg  RA V Wave 28 mmHg  RA Mean 21 mmHg  RV Systolic Pressure 69 mmHg  RV Diastolic Pressure 6 mmHg  RV EDP 22 mmHg  PA Systolic Pressure 70 mmHg  PA Diastolic Pressure 26 mmHg  PA Mean 41 mmHg  PW A Wave -99 mmHg  PW V Wave 54 mmHg  PW Mean 30 mmHg  QP/QS 1  TPVR Index 9.2 HRUI   1. Moderate pulmonary venous hypertension. Mean PAP 40 mm Hg 2. Elevated LV filling pressures. Mean PCWP 30 mm Hg with prominent V wave to 54 mm Hg 3. Good cardiac output. Index 4.95.   Patient Profile     79 y.o. female  with diastolic HF, CKD, persistent Afib, Asthma, obesity (BMI 45), admitted 02/03/2020 for acute diastolic HF.   Assessment & Plan    Acute diastolic HF:   Started PO Lasix today.      Greater than 9 liters negative since admission at least.  Check BMET early next week.  Home on Lasix 40 po bid.  Other instructions as on note yesterday.   AKI:    Creat is stable .  She has follow up scheduled with renal.   Atrial fib:    Paroxysmal.    Maintaining NSR.   HTN:    BP is well controlled.   Continue current therapy.   For questions or updates, please contact [place] Please consult [email or web address] for contact info under Cardiology/STEMI.   Signed, Minus Breeding, MD  02/14/2020, 7:51 AM

## 2020-02-14 NOTE — Care Management Important Message (Signed)
Important Message  Patient Details  Name: Jackie Parker MRN: [id number] Date of Birth: 10/17/1940   Medicare Important Message Given:  Yes     Shelda Altes 02/14/2020, 12:00 PM

## 2020-02-14 NOTE — Discharge Summary (Addendum)
Discharge Summary    Patient ID: Mical Brun MRN: [id number]; DOB: 1940-12-27  Admit date: 02/03/2020 Discharge date: 02/14/2020  Primary Care Provider: [name], MD  Primary Cardiologist: Sinclair Grooms, MD  Primary Electrophysiologist:  Thompson Grayer, MD   Discharge Diagnoses    Principal Problem:   Acute on chronic diastolic (congestive) heart failure [facility]) Active Problems:   HYPOKALEMIA   Essential hypertension   AKI (acute kidney injury) ([place])   Persistent atrial fibrillation (HCC)   CKD (chronic kidney disease) stage 3, GFR 30-59 ml/min    Diagnostic Studies/Procedures    Cardiac cath 02/11/20  Hemodynamic findings consistent with moderate pulmonary hypertension.  1. Moderate pulmonary venous hypertension. Mean PAP 40 mm Hg 2. Elevated LV filling pressures. Mean PCWP 30 mm Hg with prominent V wave to 54 mm Hg 3. Good cardiac output. Index 4.95.  Plan: continue IV diuresis.  Echo 02/05/20 1. Left ventricular ejection fraction, by estimation, is 60 to 65%. The  left ventricle has normal function. The left ventricle has no regional  wall motion abnormalities. Left ventricular diastolic function could not  be evaluated.  2. Right ventricular systolic function is normal. The right ventricular  size is normal. There is mildly elevated pulmonary artery systolic  pressure. The estimated right ventricular systolic pressure is 79.8 mmHg.  3. The mitral valve is degenerative. Trivial mitral valve regurgitation.  No evidence of mitral stenosis.  4. The aortic valve is tricuspid. Aortic valve regurgitation is not  visualized. Mild aortic valve stenosis. Aortic valve area, by VTI measures  1.61 cm. Aortic valve mean gradient measures 12.6 mmHg. Aortic valve Vmax  measures 2.53 m/s.  5. The inferior vena cava is dilated in size with >50% respiratory  variability, suggesting right atrial pressure of 8 mmHg.  _____________   History of Present Illness      Jackie Parker is a 79 y.o. female with pmh of persistent afib on amiodarone and no a/c due to uterine bleeding (followed by OBGYN), HTN, ulcerative colitis s/p colectomy 1977, OA s/p TKR, chronic hypokalemia, psoriasis, asthma who was admitted from the cardiology office 02/03/20 for fluid overload. She arrived in a wheelchair and noted significant dyspnea on exertion as well as at rest. Denied chest pain. Reported orthopnea. She also felt her abdomen was swollen. She reported weight gain of at least 50lbs since November 2020. On exam she was severely volume overloaded with JVD to the jaw and severe edema. The case was discussed with MD who agreed and the patient was admitted.  Hospital Course     Consultants: Nephrology  On admission the patient was started on IV lasix 60 mg BID. Potassium was increased to 65mq TID. Patient was in sinus, amiodarone was continued. No anticoagulation due to bleeding risk however patient reported since uterine bleeding stopped she might be able to start anticoagulation. Diltiazem was continued for rate control and blood pressure. BNP found to be elevated to 222. CXR showed slight left base atelectasis. Echo was repeated which showed LVEF 60-65%, normal RV function, mildly elevated pulmonary artery systolic pressure estimated at 436mg, degenerative MV, trivial TR, mild AS, mean gradient 12.5672m, dilated IVC. Creatinine was up around 3 with a baseline 1.7-2. Also the fact that BNP up only to 222 suggested renal failure was contributing to fluid overload. Nephrology was consulted for AKI on CKD and volume overload. CT from 5/12 was reviewed which showed possible uterine mass and simple cyst, normal kidneys without obstruction. Continued diuresis was recommended by nephrology.  RRT was discussed as well. Pyuria was noted on UA however patient was asymptomatic. Patient had good urine output however was still very volume overloaded and Lasix was increased to 153m BID. Urine  output was low so lasix was increased again to 164mBID and metolazone was added. Creatinine started improving.  Patient was set up for RHC and diuretic was held. On 6/1 she underwent RHC which showed moderate pulmonary venous HTN, mean PAP 40 mmHg, elevated filling pressures, mean PCWP 30 mmHg with prominent V wave to 54 mmHg, good cardiac output. Plan was to switch to oral diuretic post cath but given elevated EDP she was given IV lasix 4042mID. Overall the patient put out 9L urine. Weights did not appear to be accurately documented (admit weight recorded at 253lbs and discharge weight at 251lbs) . Low salt diet and fluid restriction was discussed. Will plan to discharge the patient on Lasix 40 mg BID with follow-up BMET in 4 days.   The patient was evaluated by Dr. HocPercival Spanish 02/14/20 and felt to be stable for discharge.   Did the patient have an acute coronary syndrome (MI, NSTEMI, STEMI, etc) this admission?:  No                               Did the patient have a percutaneous coronary intervention (stent / angioplasty)?:  No.   _____________  Discharge Vitals Blood pressure (!) 103/56, pulse 81, temperature 98.3 F (36.8 C), temperature source Oral, resp. rate 17, height 5' 2"  (1.575 m), weight 113.9 kg, SpO2 97 %.  Filed Weights   02/12/20 0439 02/13/20 0202 02/14/20 0500  Weight: 113.5 kg 113.9 kg 113.9 kg    Labs & Radiologic Studies    CBC Recent Labs    02/11/20 1706  HGB 13.3  HCT 39.29.7Basic Metabolic Panel Recent Labs    02/12/20 1052 02/12/20 1052 02/13/20 0348 02/14/20 0448  NA 139   < > 136 138  K 3.8   < > 3.8 3.7  CL 103   < > 102 102  CO2 26   < > 25 26  GLUCOSE 104*   < > 101* 87  BUN 30*   < > 32* 34*  CREATININE 3.70*   < > 3.92* 3.94*  CALCIUM 8.3*   < > 7.9* 7.9*  PHOS 3.7  --   --   --    < > = values in this interval not displayed.   Liver Function Tests Recent Labs    02/12/20 1052  ALBUMIN 2.7*   No results for input(s): LIPASE,  AMYLASE in the last 72 hours. High Sensitivity Troponin:   No results for input(s): TROPONINIHS in the last 720 hours.  BNP Invalid input(s): POCBNP D-Dimer No results for input(s): DDIMER in the last 72 hours. Hemoglobin A1C No results for input(s): HGBA1C in the last 72 hours. Fasting Lipid Panel No results for input(s): CHOL, HDL, LDLCALC, TRIG, CHOLHDL, LDLDIRECT in the last 72 hours. Thyroid Function Tests No results for input(s): TSH, T4TOTAL, T3FREE, THYROIDAB in the last 72 hours.  Invalid input(s): FREET3 _____________  CT Abdomen Pelvis Wo Contrast  Result Date: 01/22/2020 CLINICAL DATA:  [age]ar old female with postmenopausal bleeding. Suspected uterine mass. EXAM: CT ABDOMEN AND PELVIS WITHOUT CONTRAST TECHNIQUE: Multidetector CT imaging of the abdomen and pelvis was performed following the standard protocol without IV contrast. COMPARISON:  No priors. FINDINGS: Lower chest: Mild  cardiomegaly. Hepatobiliary: Liver has a shrunken appearance and nodular contour, indicative of underlying cirrhosis. No discrete cystic or solid hepatic lesions are confidently identified on today's noncontrast CT examination. Multiple tiny calcified gallstones lie dependently in the gallbladder. No findings to suggest an acute cholecystitis at this time. Pancreas: No definite pancreatic mass or peripancreatic fluid collections or inflammatory changes are confidently identified on today's noncontrast CT examination. Spleen: Unremarkable. Adrenals/Urinary Tract: In the anterior aspect of the interpolar region of the left kidney there is an exophytic 1.6 cm low-attenuation lesion which is incompletely characterized on today's non-contrast CT examination, but statistically likely to represent a cyst. Right kidney and bilateral adrenal glands are unremarkable in appearance. No hydroureteronephrosis. Urinary bladder is unremarkable in appearance. Stomach/Bowel: Unenhanced appearance of the stomach is normal. No  pathologic dilatation of small bowel. Right lower quadrant ileostomy. Status post total colectomy. Vascular/Lymphatic: Atherosclerotic calcifications of the abdominal aorta and pelvic vasculature. No lymphadenopathy noted in the abdomen or pelvis. Reproductive: Uterus is enlarged measuring 14.0 x 7.8 x 9.2 cm. No discrete uterine mass confidently identified on today's noncontrast CT. However, there is a large amount of gas within the endometrial canal. Unenhanced appearance of the ovaries is unremarkable. Other: No significant volume of ascites.  No pneumoperitoneum. Musculoskeletal: Interbody cage at L4-L5. There are no aggressive appearing lytic or blastic lesions noted in the visualized portions of the skeleton. IMPRESSION: 1. Large amount of gas in the endometrial canal. This is of uncertain etiology and significance, but could be related to cervical incompetence following prior dilatation and curettage-procedure 08/05/2019. However, clinical correlation to exclude signs and symptoms of endometritis recommended. 2. Morphologic changes in the liver indicative of cirrhosis. 3. Cholelithiasis without evidence of acute cholecystitis at this time. 4. Aortic atherosclerosis. 5. Mild cardiomegaly. 6. Additional incidental findings, as above. Electronically Signed   By: Vinnie Langton M.D.   On: 01/22/2020 13:55   DG Chest 2 View  Result Date: 02/04/2020 CLINICAL DATA:  Volume overload EXAM: CHEST - 2 VIEW COMPARISON:  November 27, 2019 FINDINGS: There is slight left base atelectasis. Lungs elsewhere clear. Heart is mildly enlarged with pulmonary vascularity normal. No adenopathy. There is degenerative change in the thoracic spine. There is postoperative change in the lower cervical region. IMPRESSION: Slight left base atelectasis. Lungs elsewhere clear. Heart mildly enlarged. No adenopathy. Electronically Signed   By: Lowella Grip III M.D.   On: 02/04/2020 08:44   CARDIAC CATHETERIZATION  Result Date:  02/11/2020  Hemodynamic findings consistent with moderate pulmonary hypertension.  1. Moderate pulmonary venous hypertension. Mean PAP 40 mm Hg 2. Elevated LV filling pressures. Mean PCWP 30 mm Hg with prominent V wave to 54 mm Hg 3. Good cardiac output. Index 4.95. Plan: continue IV diuresis.   ECHOCARDIOGRAM COMPLETE  Result Date: 02/05/2020    ECHOCARDIOGRAM REPORT   Patient Name:   Jackie Parker Date of Exam: 02/05/2020 Medical Rec #:  [id number]        Height:       62.0 in Accession #:    [id number]       Weight:       250.3 lb Date of Birth:  November 29, 1940        BSA:          2.103 m Patient Age:    89 years         BP:           127/66 mmHg Patient Gender: F  HR:           88 bpm. Exam Location:  Inpatient Procedure: 2D Echo, Cardiac Doppler and Color Doppler Indications:    CHF-Acute Diastolic 542.70 / W23.76  History:        Patient has prior history of Echocardiogram examinations, most                 recent 08/02/2019. CHF, Arrythmias:PVC, PAC and Atrial                 Fibrillation; Risk Factors:Sleep Apnea, Hypertension and                 Non-Smoker.  Sonographer:    Vickie Epley RDCS Referring Phys: [place]  1. Left ventricular ejection fraction, by estimation, is 60 to 65%. The left ventricle has normal function. The left ventricle has no regional wall motion abnormalities. Left ventricular diastolic function could not be evaluated.  2. Right ventricular systolic function is normal. The right ventricular size is normal. There is mildly elevated pulmonary artery systolic pressure. The estimated right ventricular systolic pressure is 28.3 mmHg.  3. The mitral valve is degenerative. Trivial mitral valve regurgitation. No evidence of mitral stenosis.  4. The aortic valve is tricuspid. Aortic valve regurgitation is not visualized. Mild aortic valve stenosis. Aortic valve area, by VTI measures 1.61 cm. Aortic valve mean gradient measures 12.6 mmHg. Aortic valve  Vmax measures 2.53 m/s.  5. The inferior vena cava is dilated in size with >50% respiratory variability, suggesting right atrial pressure of 8 mmHg. Comparison(s): No significant change from prior study. EF ~60%. Mild to moderate RVSP 45 mmHG on this study. FINDINGS  Left Ventricle: Left ventricular ejection fraction, by estimation, is 60 to 65%. The left ventricle has normal function. The left ventricle has no regional wall motion abnormalities. The left ventricular internal cavity size was normal in size. There is  no left ventricular hypertrophy. Left ventricular diastolic function could not be evaluated due to atrial fibrillation. Left ventricular diastolic function could not be evaluated. Right Ventricle: The right ventricular size is normal. No increase in right ventricular wall thickness. Right ventricular systolic function is normal. There is mildly elevated pulmonary artery systolic pressure. The tricuspid regurgitant velocity is 3.05  m/s, and with an assumed right atrial pressure of 8 mmHg, the estimated right ventricular systolic pressure is 15.1 mmHg. Left Atrium: Left atrial size was normal in size. Right Atrium: Right atrial size was normal in size. Pericardium: Trivial pericardial effusion is present. Mitral Valve: The mitral valve is degenerative in appearance. There is mild thickening of the mitral valve leaflet(s). There is mild calcification of the mitral valve leaflet(s). Mild mitral annular calcification. Trivial mitral valve regurgitation. No evidence of mitral valve stenosis. Tricuspid Valve: The tricuspid valve is grossly normal. Tricuspid valve regurgitation is mild . No evidence of tricuspid stenosis. Aortic Valve: The aortic valve is tricuspid. Aortic valve regurgitation is not visualized. Mild aortic stenosis is present. Aortic valve mean gradient measures 12.6 mmHg. Aortic valve peak gradient measures 25.7 mmHg. Aortic valve area, by VTI measures 1.61 cm. Pulmonic Valve: The pulmonic  valve was grossly normal. Pulmonic valve regurgitation is trivial. No evidence of pulmonic stenosis. Aorta: The aortic root is normal in size and structure. Venous: The inferior vena cava is dilated in size with greater than 50% respiratory variability, suggesting right atrial pressure of 8 mmHg. IAS/Shunts: The atrial septum is grossly normal.  LEFT VENTRICLE PLAX 2D LVIDd:  5.46 cm LVIDs:         3.80 cm LV PW:         0.66 cm LV IVS:        0.64 cm LVOT diam:     2.00 cm LV SV:         71 LV SV Index:   34 LVOT Area:     3.14 cm  LV Volumes (MOD) LV vol d, MOD A2C: 110.0 ml LV vol d, MOD A4C: 112.0 ml LV vol s, MOD A2C: 50.9 ml LV vol s, MOD A4C: 44.3 ml LV SV MOD A2C:     59.1 ml LV SV MOD A4C:     112.0 ml LV SV MOD BP:      63.3 ml RIGHT VENTRICLE TAPSE (M-mode): 1.8 cm LEFT ATRIUM             Index       RIGHT ATRIUM           Index LA diam:        3.80 cm 1.81 cm/m  RA Area:     12.60 cm LA Vol (A2C):   37.9 ml 18.02 ml/m RA Volume:   26.80 ml  12.74 ml/m LA Vol (A4C):   22.4 ml 10.65 ml/m LA Biplane Vol: 31.6 ml 15.03 ml/m  AORTIC VALVE AV Area (Vmax):    1.51 cm AV Area (Vmean):   1.76 cm AV Area (VTI):     1.61 cm AV Vmax:           253.48 cm/s AV Vmean:          166.083 cm/s AV VTI:            0.442 m AV Peak Grad:      25.7 mmHg AV Mean Grad:      12.6 mmHg LVOT Vmax:         122.00 cm/s LVOT Vmean:        93.300 cm/s LVOT VTI:          0.227 m LVOT/AV VTI ratio: 0.51  AORTA Ao Root diam: 2.70 cm MR Peak grad: 77.1 mmHg   TRICUSPID VALVE MR Vmax:      439.00 cm/s TR Peak grad:   37.2 mmHg                           TR Vmax:        305.00 cm/s                            SHUNTS                           Systemic VTI:  0.23 m                           Systemic Diam: 2.00 cm Eleonore Chiquito MD Electronically signed by Eleonore Chiquito MD Signature Date/Time: 02/05/2020/12:38:02 PM    Final    Disposition   Pt is being discharged home today in good condition.  Follow-up Plans & Appointments      Follow-up Information    Tommie Raymond, NP Follow up on 02/25/2020.   Specialty: Cardiology Why: @ 11:15AM Contact information: [place] [address] [phone]        [facility] Follow up on 02/17/2020.   Specialty: Cardiology Why: Please go to the office for bloodwork 7:30AM to 4:30 PM Contact information: [address], [place] [phone]       [name], MD. Go on 03/17/2020.   Specialty: Family Medicine Why: @9 :50am Contact information: [place] [address] [phone]          Discharge Instructions    ([place]) Call MD:  Anytime you have any of the following symptoms: 1) 3 pound weight gain in 24 hours or 5 pounds in 1 week 2) shortness of breath, with or without a dry hacking cough 3) swelling in the hands, feet or stomach 4) if you have to sleep on extra pillows at night in order to breathe.   Complete by: As directed    Avoid straining   Complete by: As directed    Heart Failure patients record your daily weight using the same scale at the same time of day   Complete by: As directed    Increase activity slowly   Complete by: As directed    STOP any activity that causes chest pain, shortness of breath, dizziness, sweating, or exessive weakness   Complete by: As directed       Discharge Medications   Allergies as of 02/14/2020      Reactions   Megace Es [megestrol Acetate] Shortness Of Breath   Sulfonamide Derivatives Rash      Medication List    TAKE these medications   albuterol (2.5 MG/3ML) 0.083% nebulizer solution Commonly known as: PROVENTIL Take 2.5 mg by nebulization 2 (two) times daily as needed for wheezing or shortness of breath.   ProAir HFA 108 (90 Base) MCG/ACT inhaler Generic drug: albuterol Inhale 1 puff into the lungs every 6 (six) hours as needed for wheezing or shortness of breath.    allopurinol 100 MG tablet Commonly known as: ZYLOPRIM TAKE 1 TABLET(100 MG) BY MOUTH DAILY What changed: See the new instructions.   amiodarone 200 MG tablet Commonly known as: PACERONE Take 1 tablet (200 mg total) by mouth daily.   brimonidine 0.2 % ophthalmic solution Commonly known as: ALPHAGAN Place 1 drop into the right eye 3 (three) times daily.   brinzolamide 1 % ophthalmic suspension Commonly known as: AZOPT Place 1 drop into the right eye 3 (three) times daily.   colchicine 0.6 MG tablet Take 2 tabs (1.2 mg) at the onset of a gout attack, may repeat 1 tab (0.6 mg) 2 hours later if symptoms persist.   diltiazem 360 MG 24 hr capsule Commonly known as: Cardizem CD Take 1 capsule (360 mg total) by mouth daily.   fluocinonide ointment 0.05 % Commonly known as: LIDEX Apply 1 application topically 2 (two) times daily as needed for itching.   Fluticasone-Salmeterol 500-50 MCG/DOSE Aepb Commonly known as: Advair Diskus Inhale 1 puff into the lungs in the morning and at bedtime.   furosemide 40 MG tablet Commonly known as: LASIX Take 1 tablet (40 mg total) by mouth 2 (two) times daily. What changed: when to take this   Lumigan 0.01 % Soln Generic drug: bimatoprost Place 1 drop into both eyes at bedtime.   medroxyPROGESTERone 10 MG tablet Commonly known as: PROVERA Take 2 tablets (20 mg total) by mouth daily.   potassium chloride 10 MEQ tablet Commonly known as: KLOR-CON Take 4 tablets (40 mEq total) by mouth 2 (two) times daily for 5 days. What changed:  how much to take   prochlorperazine 10 MG tablet Commonly known as: COMPAZINE TAKE 1 TABLET(10 MG) BY MOUTH TWICE DAILY AS NEEDED FOR NAUSEA OR VOMITING What changed: See the new instructions.          Outstanding Labs/Studies   BMET next week  Duration of Discharge Encounter   Greater than 30 minutes including physician time.  Signed, Kyeshia Zinn Ninfa [place], PA-C 02/14/2020, 8:52 AM

## 2020-02-17 ENCOUNTER — Other Ambulatory Visit: Payer: Medicare Other

## 2020-02-17 NOTE — Telephone Encounter (Signed)
**Note De-Identified  Obfuscation** 1st TCM Call: No answer at the only number listed in chart to reach the pt at [phone]) so I left a detailed message on the pts VM (ok per DPR) asking her to call me back. I also called the pts daughter Joseph Art [facility]) at [phone] and got no answer so I left a message asking her to call me back at [phone].

## 2020-02-17 NOTE — Telephone Encounter (Signed)
Follow up    Pts daughter is returning call   Call transferred to [facility]

## 2020-02-17 NOTE — Telephone Encounter (Signed)
**Note De-Identified  Obfuscation** The pts daughter Joseph Art called back.  Patient's daughter Joseph Art called me back regarding the pts discharge from [facility] on 02/18/2020.  Joseph Art understands that the pt is to f/u per hosital discharge instructions from 02/14/20 as follows: Follow-up Information    Tommie Raymond, NP Follow up on 02/25/2020.   Specialty: Cardiology Why: @ 11:15AM Contact information: [place] [address] [phone]    We have the pts hospital f/u scheduled for tomorrow 6/8 but Joseph Art states that they were unaware of this appt change and that the pt cannot keep this appt. I sent this call to scheduling to re-schedule.       Renee understands the pts discharge instructions? Yes Renee understands the pts medications and regiment? Yes Renee understands to bring all the pts medications to this visit? Yes  Ask patient:  Are you enrolled in My Chart:  Joseph Art states that she will discuss [place] with pt and help her sign up if interested.   }

## 2020-02-18 ENCOUNTER — Ambulatory Visit: Payer: Medicare Other | Admitting: Cardiology

## 2020-02-18 ENCOUNTER — Other Ambulatory Visit: Payer: Medicare Other

## 2020-02-22 ENCOUNTER — Other Ambulatory Visit (HOSPITAL_COMMUNITY)
Admission: RE | Admit: 2020-02-22 | Discharge: 2020-02-22 | Disposition: A | Discharge status: Home / Self Care | Payer: Medicare Other | Source: Ambulatory Visit | Attending: Pulmonary Disease | Admitting: Pulmonary Disease

## 2020-02-22 DIAGNOSIS — Z01812 Encounter for preprocedural laboratory examination: Secondary | ICD-10-CM | POA: Insufficient documentation

## 2020-02-22 DIAGNOSIS — G4733 Obstructive sleep apnea (adult) (pediatric): Secondary | ICD-10-CM | POA: Diagnosis not present

## 2020-02-22 DIAGNOSIS — K5190 Ulcerative colitis, unspecified, without complications: Secondary | ICD-10-CM | POA: Diagnosis not present

## 2020-02-22 DIAGNOSIS — J45909 Unspecified asthma, uncomplicated: Secondary | ICD-10-CM | POA: Diagnosis not present

## 2020-02-22 DIAGNOSIS — J90 Pleural effusion, not elsewhere classified: Secondary | ICD-10-CM | POA: Diagnosis not present

## 2020-02-22 DIAGNOSIS — N179 Acute kidney failure, unspecified: Secondary | ICD-10-CM | POA: Diagnosis not present

## 2020-02-22 DIAGNOSIS — Z7951 Long term (current) use of inhaled steroids: Secondary | ICD-10-CM | POA: Diagnosis not present

## 2020-02-22 DIAGNOSIS — R918 Other nonspecific abnormal finding of lung field: Secondary | ICD-10-CM | POA: Diagnosis not present

## 2020-02-22 DIAGNOSIS — H409 Unspecified glaucoma: Secondary | ICD-10-CM | POA: Diagnosis not present

## 2020-02-22 DIAGNOSIS — I509 Heart failure, unspecified: Secondary | ICD-10-CM | POA: Diagnosis not present

## 2020-02-22 DIAGNOSIS — I130 Hypertensive heart and chronic kidney disease with heart failure and stage 1 through stage 4 chronic kidney disease, or unspecified chronic kidney disease: Secondary | ICD-10-CM | POA: Diagnosis not present

## 2020-02-22 DIAGNOSIS — I517 Cardiomegaly: Secondary | ICD-10-CM | POA: Diagnosis not present

## 2020-02-22 DIAGNOSIS — Z888 Allergy status to other drugs, medicaments and biological substances status: Secondary | ICD-10-CM | POA: Diagnosis not present

## 2020-02-22 DIAGNOSIS — N189 Chronic kidney disease, unspecified: Secondary | ICD-10-CM | POA: Diagnosis not present

## 2020-02-22 DIAGNOSIS — I4819 Other persistent atrial fibrillation: Secondary | ICD-10-CM | POA: Diagnosis not present

## 2020-02-22 DIAGNOSIS — Z9049 Acquired absence of other specified parts of digestive tract: Secondary | ICD-10-CM | POA: Diagnosis not present

## 2020-02-22 DIAGNOSIS — Z882 Allergy status to sulfonamides status: Secondary | ICD-10-CM | POA: Diagnosis not present

## 2020-02-22 DIAGNOSIS — R0989 Other specified symptoms and signs involving the circulatory and respiratory systems: Secondary | ICD-10-CM | POA: Diagnosis not present

## 2020-02-22 DIAGNOSIS — I5033 Acute on chronic diastolic (congestive) heart failure: Secondary | ICD-10-CM | POA: Diagnosis not present

## 2020-02-22 DIAGNOSIS — N184 Chronic kidney disease, stage 4 (severe): Secondary | ICD-10-CM | POA: Diagnosis not present

## 2020-02-22 DIAGNOSIS — R0602 Shortness of breath: Secondary | ICD-10-CM | POA: Diagnosis not present

## 2020-02-22 DIAGNOSIS — Z96653 Presence of artificial knee joint, bilateral: Secondary | ICD-10-CM | POA: Diagnosis not present

## 2020-02-22 DIAGNOSIS — Z20822 Contact with and (suspected) exposure to covid-19: Secondary | ICD-10-CM | POA: Insufficient documentation

## 2020-02-22 DIAGNOSIS — Z79899 Other long term (current) drug therapy: Secondary | ICD-10-CM | POA: Diagnosis not present

## 2020-02-22 LAB — SARS CORONAVIRUS 2 (TAT 6-24 HRS): SARS Coronavirus 2: NEGATIVE

## 2020-02-24 ENCOUNTER — Other Ambulatory Visit: Payer: Self-pay

## 2020-02-24 ENCOUNTER — Emergency Department (HOSPITAL_COMMUNITY): Payer: Medicare Other

## 2020-02-24 ENCOUNTER — Other Ambulatory Visit: Payer: Medicare Other

## 2020-02-24 ENCOUNTER — Ambulatory Visit: Payer: Medicare Other | Admitting: Pulmonary Disease

## 2020-02-24 ENCOUNTER — Inpatient Hospital Stay (HOSPITAL_COMMUNITY)
Admission: EM | Admit: 2020-02-24 | Discharge: 2020-03-05 | DRG: 291 | Disposition: A | Discharge status: Hospice | Payer: Medicare Other | Attending: Internal Medicine | Admitting: Internal Medicine

## 2020-02-24 ENCOUNTER — Encounter (HOSPITAL_COMMUNITY): Payer: Self-pay

## 2020-02-24 DIAGNOSIS — N184 Chronic kidney disease, stage 4 (severe): Secondary | ICD-10-CM | POA: Diagnosis present

## 2020-02-24 DIAGNOSIS — I130 Hypertensive heart and chronic kidney disease with heart failure and stage 1 through stage 4 chronic kidney disease, or unspecified chronic kidney disease: Principal | ICD-10-CM | POA: Diagnosis present

## 2020-02-24 DIAGNOSIS — I480 Paroxysmal atrial fibrillation: Secondary | ICD-10-CM | POA: Diagnosis present

## 2020-02-24 DIAGNOSIS — Z743 Need for continuous supervision: Secondary | ICD-10-CM | POA: Diagnosis not present

## 2020-02-24 DIAGNOSIS — Z9049 Acquired absence of other specified parts of digestive tract: Secondary | ICD-10-CM | POA: Diagnosis not present

## 2020-02-24 DIAGNOSIS — M2550 Pain in unspecified joint: Secondary | ICD-10-CM | POA: Diagnosis not present

## 2020-02-24 DIAGNOSIS — M7989 Other specified soft tissue disorders: Secondary | ICD-10-CM | POA: Diagnosis not present

## 2020-02-24 DIAGNOSIS — I5030 Unspecified diastolic (congestive) heart failure: Secondary | ICD-10-CM | POA: Diagnosis not present

## 2020-02-24 DIAGNOSIS — J45909 Unspecified asthma, uncomplicated: Secondary | ICD-10-CM | POA: Diagnosis present

## 2020-02-24 DIAGNOSIS — R918 Other nonspecific abnormal finding of lung field: Secondary | ICD-10-CM | POA: Diagnosis present

## 2020-02-24 DIAGNOSIS — R0989 Other specified symptoms and signs involving the circulatory and respiratory systems: Secondary | ICD-10-CM | POA: Diagnosis present

## 2020-02-24 DIAGNOSIS — Z20822 Contact with and (suspected) exposure to covid-19: Secondary | ICD-10-CM | POA: Diagnosis present

## 2020-02-24 DIAGNOSIS — J45902 Unspecified asthma with status asthmaticus: Secondary | ICD-10-CM

## 2020-02-24 DIAGNOSIS — N189 Chronic kidney disease, unspecified: Secondary | ICD-10-CM | POA: Diagnosis not present

## 2020-02-24 DIAGNOSIS — Z96653 Presence of artificial knee joint, bilateral: Secondary | ICD-10-CM | POA: Diagnosis present

## 2020-02-24 DIAGNOSIS — M160 Bilateral primary osteoarthritis of hip: Secondary | ICD-10-CM | POA: Diagnosis not present

## 2020-02-24 DIAGNOSIS — J90 Pleural effusion, not elsewhere classified: Secondary | ICD-10-CM | POA: Diagnosis not present

## 2020-02-24 DIAGNOSIS — I4819 Other persistent atrial fibrillation: Secondary | ICD-10-CM | POA: Diagnosis present

## 2020-02-24 DIAGNOSIS — J4541 Moderate persistent asthma with (acute) exacerbation: Secondary | ICD-10-CM | POA: Diagnosis not present

## 2020-02-24 DIAGNOSIS — H409 Unspecified glaucoma: Secondary | ICD-10-CM | POA: Diagnosis present

## 2020-02-24 DIAGNOSIS — I5033 Acute on chronic diastolic (congestive) heart failure: Secondary | ICD-10-CM | POA: Diagnosis not present

## 2020-02-24 DIAGNOSIS — G4733 Obstructive sleep apnea (adult) (pediatric): Secondary | ICD-10-CM | POA: Diagnosis present

## 2020-02-24 DIAGNOSIS — Z882 Allergy status to sulfonamides status: Secondary | ICD-10-CM

## 2020-02-24 DIAGNOSIS — Z515 Encounter for palliative care: Secondary | ICD-10-CM | POA: Diagnosis not present

## 2020-02-24 DIAGNOSIS — E8770 Fluid overload, unspecified: Secondary | ICD-10-CM | POA: Diagnosis not present

## 2020-02-24 DIAGNOSIS — Z7951 Long term (current) use of inhaled steroids: Secondary | ICD-10-CM

## 2020-02-24 DIAGNOSIS — I509 Heart failure, unspecified: Secondary | ICD-10-CM | POA: Diagnosis not present

## 2020-02-24 DIAGNOSIS — Z888 Allergy status to other drugs, medicaments and biological substances status: Secondary | ICD-10-CM

## 2020-02-24 DIAGNOSIS — R0602 Shortness of breath: Secondary | ICD-10-CM

## 2020-02-24 DIAGNOSIS — I517 Cardiomegaly: Secondary | ICD-10-CM | POA: Diagnosis not present

## 2020-02-24 DIAGNOSIS — R069 Unspecified abnormalities of breathing: Secondary | ICD-10-CM | POA: Diagnosis not present

## 2020-02-24 DIAGNOSIS — N179 Acute kidney failure, unspecified: Secondary | ICD-10-CM | POA: Diagnosis not present

## 2020-02-24 DIAGNOSIS — Z79899 Other long term (current) drug therapy: Secondary | ICD-10-CM

## 2020-02-24 DIAGNOSIS — K5190 Ulcerative colitis, unspecified, without complications: Secondary | ICD-10-CM | POA: Diagnosis present

## 2020-02-24 DIAGNOSIS — R0902 Hypoxemia: Secondary | ICD-10-CM | POA: Diagnosis not present

## 2020-02-24 DIAGNOSIS — Z7189 Other specified counseling: Secondary | ICD-10-CM | POA: Diagnosis not present

## 2020-02-24 DIAGNOSIS — Z7401 Bed confinement status: Secondary | ICD-10-CM | POA: Diagnosis not present

## 2020-02-24 DIAGNOSIS — R52 Pain, unspecified: Secondary | ICD-10-CM

## 2020-02-24 DIAGNOSIS — I4891 Unspecified atrial fibrillation: Secondary | ICD-10-CM | POA: Diagnosis not present

## 2020-02-24 DIAGNOSIS — R531 Weakness: Secondary | ICD-10-CM | POA: Diagnosis not present

## 2020-02-24 DIAGNOSIS — Z933 Colostomy status: Secondary | ICD-10-CM | POA: Diagnosis not present

## 2020-02-24 DIAGNOSIS — Z66 Do not resuscitate: Secondary | ICD-10-CM | POA: Diagnosis not present

## 2020-02-24 HISTORY — DX: Unspecified osteoarthritis, unspecified site: M19.90

## 2020-02-24 LAB — I-STAT CHEM 8, ED
BUN: 47 mg/dL — ABNORMAL HIGH (ref 8–23)
Calcium, Ion: 1.08 mmol/L — ABNORMAL LOW (ref 1.15–1.40)
Chloride: 106 mmol/L (ref 98–111)
Creatinine, Ser: 5.6 mg/dL — ABNORMAL HIGH (ref 0.44–1.00)
Glucose, Bld: 99 mg/dL (ref 70–99)
HCT: 38 % (ref 36.0–46.0)
Hemoglobin: 12.9 g/dL (ref 12.0–15.0)
Potassium: 4.2 mmol/L (ref 3.5–5.1)
Sodium: 138 mmol/L (ref 135–145)
TCO2: 24 mmol/L (ref 22–32)

## 2020-02-24 LAB — CBC WITH DIFFERENTIAL/PLATELET
Abs Immature Granulocytes: 0.07 10*3/uL (ref 0.00–0.07)
Basophils Absolute: 0 10*3/uL (ref 0.0–0.1)
Basophils Relative: 0 %
Eosinophils Absolute: 0.1 10*3/uL (ref 0.0–0.5)
Eosinophils Relative: 1 %
HCT: 36.6 % (ref 36.0–46.0)
Hemoglobin: 11.2 g/dL — ABNORMAL LOW (ref 12.0–15.0)
Immature Granulocytes: 1 %
Lymphocytes Relative: 19 %
Lymphs Abs: 1.9 10*3/uL (ref 0.7–4.0)
MCH: 28.9 pg (ref 26.0–34.0)
MCHC: 30.6 g/dL (ref 30.0–36.0)
MCV: 94.6 fL (ref 80.0–100.0)
Monocytes Absolute: 1.7 10*3/uL — ABNORMAL HIGH (ref 0.1–1.0)
Monocytes Relative: 18 %
Neutro Abs: 6 10*3/uL (ref 1.7–7.7)
Neutrophils Relative %: 61 %
Platelets: 157 10*3/uL (ref 150–400)
RBC: 3.87 MIL/uL (ref 3.87–5.11)
RDW: 16.8 % — ABNORMAL HIGH (ref 11.5–15.5)
WBC: 9.8 10*3/uL (ref 4.0–10.5)
nRBC: 0 % (ref 0.0–0.2)

## 2020-02-24 LAB — COMPREHENSIVE METABOLIC PANEL
ALT: 19 U/L (ref 0–44)
AST: 27 U/L (ref 15–41)
Albumin: 3.1 g/dL — ABNORMAL LOW (ref 3.5–5.0)
Alkaline Phosphatase: 47 U/L (ref 38–126)
Anion gap: 12 (ref 5–15)
BUN: 46 mg/dL — ABNORMAL HIGH (ref 8–23)
CO2: 22 mmol/L (ref 22–32)
Calcium: 8.8 mg/dL — ABNORMAL LOW (ref 8.9–10.3)
Chloride: 103 mmol/L (ref 98–111)
Creatinine, Ser: 5.2 mg/dL — ABNORMAL HIGH (ref 0.44–1.00)
GFR calc Af Amer: 9 mL/min — ABNORMAL LOW (ref >60)
GFR calc non Af Amer: 7 mL/min — ABNORMAL LOW (ref >60)
Glucose, Bld: 98 mg/dL (ref 70–99)
Potassium: 4.3 mmol/L (ref 3.5–5.1)
Sodium: 137 mmol/L (ref 135–145)
Total Bilirubin: 1.4 mg/dL — ABNORMAL HIGH (ref 0.3–1.2)
Total Protein: 8.1 g/dL (ref 6.5–8.1)

## 2020-02-24 LAB — BRAIN NATRIURETIC PEPTIDE: B Natriuretic Peptide: 324.8 pg/mL — ABNORMAL HIGH (ref 0.0–100.0)

## 2020-02-24 LAB — URINALYSIS, COMPLETE (UACMP) WITH MICROSCOPIC
Bilirubin Urine: NEGATIVE
Glucose, UA: NEGATIVE mg/dL
Ketones, ur: NEGATIVE mg/dL
Nitrite: NEGATIVE
Protein, ur: >=300 mg/dL — AB
RBC / HPF: >50 RBC/hpf — ABNORMAL HIGH (ref 0–5)
Specific Gravity, Urine: 1.017 (ref 1.005–1.030)
WBC, UA: >50 WBC/hpf — ABNORMAL HIGH (ref 0–5)
pH: 5 (ref 5.0–8.0)

## 2020-02-24 NOTE — ED Notes (Signed)
Report given to oncoming nurse.

## 2020-02-24 NOTE — Consult Note (Signed)
[facility] Consult Note   Primary Physician:  [name], MD   Primary Cardiologist:   Sinclair Grooms, MD Electrophysiologist:   Thompson Grayer, MD   Reason for Consultation:  Worsening shortness of breath  HPI:    Jackie Parker is a 79 year old female with a past medical history significant for diastolic heart failure, persistent atrial fibrillation (on amiodarone and no anticoagulation due to uterine bleeding), chronic kidney disease, obesity (BMI 45), HTN, ulcerative colitis (s/p colectomy 1977), OA s/p TKR, chronic hypokalemia, psoriasis, and asthma, who presents to the hospital with shortness of breath. The patient states that her symptoms worsened over the weekend. She has been fatigued and has difficulty walking. She also complains of right hip pain. She has been compliant with her medications (including her diuretics). She sleeps on one pillow. She denies any PND. No complaints of chest pain.  She was in the hospital for volume overload from 02/03/2020 2 02/14/2020.  On that admission she had reported a 50 pound weight gain since November 2020.  It was felt that this volume overload was likely due to a mixed etiology of CHF and renal failure.  She was aggressively diuresed with IV Lasix.  Metolazone was also added.  The right heart catheterization on 02/11/2020 showed moderate pulmonary venous hypertension, mean PAP 40 mmHg, mean PCWP 30 mmHg and preserved cardiac output.  During the current hospitalization her vital signs were: blood pressure 126/52 mmHg with heart rate of 88 bpm. The labs in the ED are as follows: Potassium 4.2, BUN 47, creatinine 5.6, calcium 8.8, BNP 324.8, WBC 9.9, hemoglobin 12.9, hematocrit 38.0, and platelets 157.  The electrocardiogram showed sinus rhythm, rate 89 bpm, low voltage QRS in the precordial leads.  The chest x-ray was significant for mild cardiomegaly with vascular congestion, mild diffuse interstitial and ground-glass opacity. Suspected small  left effusion.   Recent cardiac work-up: Right Heart Cath - 02/11/2020   Fick Cardiac Output 9.12 L/min  Fick Cardiac Output Index 4.35 (L/min)/BSA  RA A Wave -99 mmHg  RA V Wave 28 mmHg  RA Mean 21 mmHg  RV Systolic Pressure 69 mmHg  RV Diastolic Pressure 6 mmHg  RV EDP 22 mmHg  PA Systolic Pressure 70 mmHg  PA Diastolic Pressure 26 mmHg  PA Mean 41 mmHg  PW A Wave -99 mmHg  PW V Wave 54 mmHg  PW Mean 30 mmHg  QP/QS 1  TPVR Index 9.2 HRUI    1. Moderate pulmonary venous hypertension. Mean PAP 40 mm Hg 2. Elevated LV filling pressures. Mean PCWP 30 mm Hg with prominent V wave to 54 mm Hg 3. Good cardiac output. Index 4.95.  Echo 02/05/20  1. Left ventricular ejection fraction, by estimation, is 60 to 65%. The  left ventricle has normal function. The left ventricle has no regional  wall motion abnormalities. Left ventricular diastolic function could not  be evaluated.   2. Right ventricular systolic function is normal. The right ventricular  size is normal. There is mildly elevated pulmonary artery systolic  pressure. The estimated right ventricular systolic pressure is 70.9 mmHg.   3. The mitral valve is degenerative. Trivial mitral valve regurgitation.  No evidence of mitral stenosis.   4. The aortic valve is tricuspid. Aortic valve regurgitation is not  visualized. Mild aortic valve stenosis. Aortic valve area, by VTI measures  1.61 cm. Aortic valve mean gradient measures 12.6 mmHg. Aortic valve Vmax  measures 2.53 m/s.   5. The inferior vena  cava is dilated in size with >50% respiratory  variability, suggesting right atrial pressure of 8 mmHg.    Home Medications Prior to Admission medications   Medication Sig Start Date End Date Taking? Authorizing Provider  albuterol (PROAIR HFA) 108 (90 Base) MCG/ACT inhaler Inhale 1 puff into the lungs every 6 (six) hours as needed for wheezing or shortness of breath.    [provider]  albuterol (PROVENTIL) (2.5  MG/3ML) 0.083% nebulizer solution Take 2.5 mg by nebulization 2 (two) times daily as needed for wheezing or shortness of breath.     [provider]  allopurinol (ZYLOPRIM) 100 MG tablet TAKE 1 TABLET(100 MG) BY MOUTH DAILY Patient taking differently: Take 100 mg by mouth as needed (gout).  04/01/19   [name], MD  amiodarone (PACERONE) 200 MG tablet Take 1 tablet (200 mg total) by mouth daily. 11/21/19   Fenton, Clint R, PA  brimonidine (ALPHAGAN) 0.2 % ophthalmic solution Place 1 drop into the right eye 3 (three) times daily. 06/18/19   [provider]  brinzolamide (AZOPT) 1 % ophthalmic suspension Place 1 drop into the right eye 3 (three) times daily.     [provider]  colchicine 0.6 MG tablet Take 2 tabs (1.2 mg) at the onset of a gout attack, may repeat 1 tab (0.6 mg) 2 hours later if symptoms persist. 10/22/18   [name], MD  diltiazem (CARDIZEM CD) 360 MG 24 hr capsule Take 1 capsule (360 mg total) by mouth daily. 11/13/19 11/12/20  Fenton, Clint R, PA  fluocinonide ointment (LIDEX) 1.28 % Apply 1 application topically 2 (two) times daily as needed for itching. 07/24/19   [provider]  Fluticasone-Salmeterol (ADVAIR DISKUS) 500-50 MCG/DOSE AEPB Inhale 1 puff into the lungs in the morning and at bedtime. 11/27/19   Olalere, Ernesto Rutherford, MD  furosemide (LASIX) 40 MG tablet Take 1 tablet (40 mg total) by mouth 2 (two) times daily. 02/14/20   Furth, Cadence H, PA-C  LUMIGAN 0.01 % SOLN Place 1 drop into both eyes at bedtime. 05/27/16   [provider]  medroxyPROGESTERone (PROVERA) 10 MG tablet Take 2 tablets (20 mg total) by mouth daily. 01/22/20   Everitt Amber, MD  potassium chloride (KLOR-CON) 10 MEQ tablet Take 4 tablets (40 mEq total) by mouth 2 (two) times daily for 5 days. Patient taking differently: Take 20 mEq by mouth 2 (two) times daily.  12/06/19 02/03/20  Allred, Jeneen Rinks, MD  prochlorperazine (COMPAZINE) 10 MG tablet TAKE 1 TABLET(10 MG) BY  MOUTH TWICE DAILY AS NEEDED FOR NAUSEA OR VOMITING Patient taking differently: Take 10 mg by mouth as needed for nausea or vomiting.  11/01/18   [name], MD    Past Medical History: Past Medical History:  Diagnosis Date  . AKI (acute kidney injury) ([place]) 12/2015  . Allergy    takes Singulair and Zyrtec daily  . Asthma    uses [place] daily  . Cataracts, bilateral   . CHF (congestive heart failure) ([place])   . Colitis, ulcerative ([place])   . DJD (degenerative joint disease)   . Glaucoma    uses eye drops daily  . History of blood transfusion    no abnormal reaction noted. 40+ yrs ago  . History of bronchitis    yrs ago  . History of gout    not on any meds  . Hypertension    takes Amlodipine daily  . Joint pain   . Joint swelling   . OSA (obstructive  sleep apnea) 12/17/2019  . PMB (postmenopausal bleeding)   . Psoriasis   . Urinary frequency    takes Ditropan daily  . Vertigo    doesn't take any meds  . Vitamin D deficiency     Past Surgical History: Past Surgical History:  Procedure Laterality Date  . BACK SURGERY    . COLONOSCOPY    . DILATATION & CURETTAGE/HYSTEROSCOPY WITH MYOSURE N/A 08/05/2019   Procedure: DILATATION & CURETTAGE/HYSTEROSCOPY WITH MYOSURE;  Surgeon: Christophe Louis, MD;  Location: [place];  Service: Gynecology;  Laterality: N/A;  Myosure rep will be here confirmed on 08/01/19 CS  . ILEOSTOMY    . KNEE ARTHROPLASTY Left 12/14/2015   Procedure: COMPUTER ASSISTED LEFT TOTAL KNEE ARTHROPLASTY;  Surgeon: Jessy Oto, MD;  Location: [place];  Service: Orthopedics;  Laterality: Left;  . RIGHT HEART CATH N/A 02/11/2020   Procedure: RIGHT HEART CATH;  Surgeon: Martinique, Peter M, MD;  Location: [place] [facility];  Service: Cardiovascular;  Laterality: N/A;  . TOTAL COLECTOMY      total colectomy in 1977  . TOTAL KNEE ARTHROPLASTY Left 12/14/2015  . TOTAL KNEE ARTHROPLASTY Right 06/27/2016  . TOTAL KNEE ARTHROPLASTY Right 06/27/2016   Procedure: RIGHT TOTAL KNEE  ARTHROPLASTY;  Surgeon: Jessy Oto, MD;  Location: [place];  Service: Orthopedics;  Laterality: Right;  . TUBAL LIGATION      Family History: Family History  Problem Relation Age of Onset  . Hypertension Sister   . Glaucoma Sister   . Endometrial cancer Neg Hx   . Ovarian cancer Neg Hx     Social History: Social History   Socioeconomic History  . Marital status: Single    Spouse name: Not on file  . Number of children: Not on file  . Years of education: Not on file  . Highest education level: Not on file  Occupational History  . Not on file  Tobacco Use  . Smoking status: Never Smoker  . Smokeless tobacco: Never Used  Vaping Use  . Vaping Use: Never used  Substance and Sexual Activity  . Alcohol use: No    Alcohol/week: 0.0 standard drinks  . Drug use: No  . Sexual activity: Not Currently    Birth control/protection: Post-menopausal  Other Topics Concern  . Not on file  Social History Narrative  . Not on file   Social Determinants of Health   Financial Resource Strain:   . Difficulty of Paying Living Expenses:   Food Insecurity:   . Worried About Charity fundraiser in the Last Year:   . Arboriculturist in the Last Year:   Transportation Needs:   . Film/video editor (Medical):   Marland Kitchen Lack of Transportation (Non-Medical):   Physical Activity:   . Days of Exercise per Week:   . Minutes of Exercise per Session:   Stress:   . Feeling of Stress :   Social Connections:   . Frequency of Communication with Friends and Family:   . Frequency of Social Gatherings with Friends and Family:   . Attends Religious Services:   . Active Member of Clubs or Organizations:   . Attends Archivist Meetings:   Marland Kitchen Marital Status:     Allergies:  Allergies  Allergen Reactions  . Megace Es [Megestrol Acetate] Shortness Of Breath  . Sulfonamide Derivatives Rash     Review of Systems: [y] = yes, [ ]  = no   . General: Weight gain [ ] ; Weight  loss [ ] ; Anorexia  [ ] ; Fatigue [Y]; Fever [ ] ; Chills [ ] ; Weakness [ ]   . Cardiac: Chest pain/pressure [ ] ; Resting SOB [Y]; Exertional SOB [Y]; Orthopnea [ ] ; Pedal Edema [Y]; Palpitations [ ] ; Syncope [ ] ; Presyncope [ ] ; Paroxysmal nocturnal dyspnea[ ]   . Pulmonary: Cough [ ] ; Wheezing[ ] ; Hemoptysis[ ] ; Sputum [ ] ; Snoring [ ]   . GI: Vomiting[ ] ; Dysphagia[ ] ; Melena[ ] ; Hematochezia [ ] ; Heartburn[ ] ; Abdominal pain [ ] ; Constipation [ ] ; Diarrhea [ ] ; BRBPR [ ]   . GU: Hematuria[ ] ; Dysuria [ ] ; Nocturia[ ]   . Vascular: Pain in legs with walking [ ] ; Pain in feet with lying flat [ ] ; Non-healing sores [ ] ; Stroke [ ] ; TIA [ ] ; Slurred speech [ ] ;  . Neuro: Headaches[ ] ; Vertigo[ ] ; Seizures[ ] ; Paresthesias[ ] ;Blurred vision [ ] ; Diplopia [ ] ; Vision changes [ ]   . Ortho/Skin: Arthritis [ ] ; Joint pain [ ] ; Muscle pain [ ] ; Joint swelling [ ] ; Back Pain [ ] ; Rash [ ]   . Psych: Depression[ ] ; Anxiety[ ]   . Heme: Bleeding problems [ ] ; Clotting disorders [ ] ; Anemia [ ]   . Endocrine: Diabetes [ ] ; Thyroid dysfunction[ ]      Objective:    Vital Signs:   Temp:  [98.3 F (36.8 C)] 98.3 F (36.8 C) (06/14 1829) Pulse Rate:  [87-88] 87 (06/14 1900) Resp:  [0-12] 0 (06/14 1900) BP: (126)/(52-53) 126/53 (06/14 1900) SpO2:  [93 %-97 %] 97 % (06/14 1900)    Weight change: There were no vitals filed for this visit.  Intake/Output:  No intake or output data in the 24 hours ending 02/24/20 2232    Physical Exam    General: No acute distress HEENT: normal Neck: supple. JVP . Carotids 2+ bilat; no bruits. No lymphadenopathy or thyromegaly appreciated. Cor: Apical systolic murmur, regular rate and rhythm Lungs: Decreased breath sounds at the bases, occasional wheeze Abdomen: soft, nontender, nondistended. No hepatosplenomegaly. No bruits or masses. Good bowel sounds. Extremities: no cyanosis, clubbing, rash, 1+ edema B/L LE  Neuro: alert & orientedx3, cranial nerves grossly intact. moves all 4  extremities w/o difficulty.  Affect pleasant     Labs   Basic Metabolic Panel: Recent Labs  Lab 02/24/20 1945 02/24/20 1956  NA 137 138  K 4.3 4.2  CL 103 106  CO2 22  --   GLUCOSE 98 99  BUN 46* 47*  CREATININE 5.20* 5.60*  CALCIUM 8.8*  --     Liver Function Tests: Recent Labs  Lab 02/24/20 1945  AST 27  ALT 19  ALKPHOS 47  BILITOT 1.4*  PROT 8.1  ALBUMIN 3.1*   No results for input(s): LIPASE, AMYLASE in the last 168 hours. No results for input(s): AMMONIA in the last 168 hours.  CBC: Recent Labs  Lab 02/24/20 1945 02/24/20 1956  WBC 9.8  --   NEUTROABS 6.0  --   HGB 11.2* 12.9  HCT 36.6 38.0  MCV 94.6  --   PLT 157  --     Cardiac Enzymes: No results for input(s): CKTOTAL, CKMB, CKMBINDEX, TROPONINI in the last 168 hours.  BNP: BNP (last 3 results) Recent Labs    08/01/19 1144 02/03/20 1844 02/24/20 1945  BNP 209.8* 222.9* 324.8*    ProBNP (last 3 results) Recent Labs    12/02/19 1224  PROBNP 779*     CBG: No results for input(s): GLUCAP in the last 168 hours.  Coagulation Studies:  No results for input(s): LABPROT, INR in the last 72 hours.   Imaging   DG Chest Port 1 View  Result Date: 02/24/2020 CLINICAL DATA:  Shortness of breath EXAM: PORTABLE CHEST 1 VIEW COMPARISON:  02/04/2020 FINDINGS: Mild cardiomegaly with vascular congestion. Mild diffuse interstitial and ground-glass opacity. Suspected small left effusion. No pneumothorax. IMPRESSION: 1. Cardiomegaly with vascular congestion and mild diffuse interstitial and ground-glass opacities suggestive of edema 2. Suspected small left effusion Electronically Signed   By: Donavan Foil M.D.   On: 02/24/2020 19:19      Medications:     Current Medications: . indomethacin  25 mg Oral BID WC     Infusions:     Assessment/Plan   1. Shortness of breath Likely to be multifactorial in etiology (HFpEF versus Acute on Chronic renal failure)  -Daily weight -Strict I  and Os -Input from nephrology service regarding acute on chronic renal failure -IV loop diuretics   2. Persistent atrial fibrillation CHA2DS2-VASc=4 (female, HTN, age x 2) >> no anticoagulation due to uterine bleeding.  Patient is currently rate controlled and in sinus rhythm.  -Continue amiodarone Rx  3. Hypertension  -Continue diltiazem   Meade Maw, MD  02/24/2020, 10:32 PM  Cardiology Overnight Team Please contact [facility] for night-coverage after hours (4p -7a ) and weekends on [email or web address]

## 2020-02-24 NOTE — ED Triage Notes (Signed)
EMS reports pt was hospitalized from 5/24 till 6/4 for CHF   Pt has been doing fine at home until this am.  Pt has had consistent edema but shortness of breath increased today.  Pt also st's she has been compliant with her Lasix.

## 2020-02-24 NOTE — ED Provider Notes (Signed)
[facility] EMERGENCY DEPARTMENT Provider Note   CSN: [id number] Arrival date & time: 02/24/20  1805     History Chief Complaint  Patient presents with  . Shortness of Breath    Jackie Parker is a 79 y.o. female.  Patient presenting the ED with shortness of breath, foot swelling with history of CHF and recent long hospitalization with significant diuresis on the cardiology service.  Patient recently increased her diuretics per cardiology team with improvement of her foot swelling but shortness of breath continues.  The history is provided by the patient, medical records and a relative.  Illness Location:  Cardiac/respiratory Quality:  Shortness of breath, volume overload Severity:  Moderate Onset quality:  Gradual Timing:  Constant Progression:  Worsening Chronicity:  Chronic Context:  Patient has history of CHF with recent hospitalization with significant diuresis now returning with worsening of her shortness of breath. Relieved by:  Diuretics Worsened by:  Nothing Ineffective treatments:  Current treatments Associated symptoms: fatigue and shortness of breath   Associated symptoms: no abdominal pain, no chest pain, no congestion, no cough, no fever, no headaches, no loss of consciousness, no nausea and no vomiting        Past Medical History:  Diagnosis Date  . AKI (acute kidney injury) ([place]) 12/2015  . Allergy    takes Singulair and Zyrtec daily  . Asthma    uses Adair daily  . Cataracts, bilateral   . CHF (congestive heart failure) ([place])   . Colitis, ulcerative ([place])   . DJD (degenerative joint disease)   . Glaucoma    uses eye drops daily  . History of blood transfusion    no abnormal reaction noted. 40+ yrs ago  . History of bronchitis    yrs ago  . History of gout    not on any meds  . Hypertension    takes Amlodipine daily  . Joint pain   . Joint swelling   . OSA (obstructive sleep apnea) 12/17/2019  . PMB (postmenopausal  bleeding)   . Psoriasis   . Urinary frequency    takes Ditropan daily  . Vertigo    doesn't take any meds  . Vitamin D deficiency     Patient Active Problem List   Diagnosis Date Noted  . Acute on chronic diastolic CHF (congestive heart failure) ([place]) 02/24/2020  . CKD (chronic kidney disease) stage 3, GFR 30-59 ml/min 02/13/2020  . Acute on chronic diastolic (congestive) heart failure ([place]) 02/03/2020  . OSA (obstructive sleep apnea) 12/17/2019  . Atrial tachycardia ([place]) 10/02/2019  . Persistent atrial fibrillation ([place]) 08/15/2019  . Secondary hypercoagulable state ([place]) 08/15/2019  . Dyspnea on exertion 08/02/2019  . Hypomagnesemia 08/02/2019  . AF (paroxysmal atrial fibrillation) ([place]) 08/01/2019  . IUD check up 08/16/2017  . Asthma 01/30/2017  . Gynecologic exam normal 11/08/2016  . Urinary incontinence 11/08/2016  . Leukocytosis 06/29/2016  . Anemia 06/29/2016  . Acute renal failure superimposed on stage 4 chronic kidney disease ([place])   . Primary localized osteoarthritis of right knee 06/27/2016  . Osteoarthritis of left knee 12/14/2015    Class: End Stage  . Osteoarthritis of right knee 12/14/2015  . Acute renal failure ([place]) 08/30/2013  . 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 N&V (nausea and vomiting) 08/30/2013  . Hypokalemia 08/30/2013  . Postmenopausal bleeding 03/18/2013  . OSTEOARTHRITIS, KNEE, LEFT 03/20/2010  . TRICUSPID REGURGITATION, MILD 03/03/2010  . Disorder resulting from impaired renal function 03/02/2010  . MORBID OBESITY 02/18/2010  .  PREMATURE ATRIAL CONTRACTIONS 02/18/2010  . PREMATURE VENTRICULAR CONTRACTIONS 02/18/2010  . CARDIAC MURMUR 02/18/2010  . EDEMA 10/20/2009  . HYPOKALEMIA 07/30/2009  . RIB PAIN, RIGHT SIDED 05/26/2008  . ALLERGIC RHINITIS 06/25/2007  . ABNORMAL BLOOD CHEMISTRY , OTHER 06/25/2007  . UNSPECIFIED VITAMIN D DEFICIENCY 04/30/2007  . Hypocalcemia 04/19/2007  . GLAUCOMA NEC 04/19/2007  . Essential hypertension  04/19/2007  . Asthma 04/19/2007  . Ulcerative colitis ([place]) 04/19/2007  . DISORDER, MENSTRUAL NEC 04/19/2007  . PSORIASIS 04/19/2007  . 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 GOUT NOS 02/21/2006    Past Surgical History:  Procedure Laterality Date  . BACK SURGERY    . COLONOSCOPY    . DILATATION & CURETTAGE/HYSTEROSCOPY WITH MYOSURE N/A 08/05/2019   Procedure: DILATATION & CURETTAGE/HYSTEROSCOPY WITH MYOSURE;  Surgeon: Christophe Louis, MD;  Location: [facility];  Service: Gynecology;  Laterality: N/A;  Myosure rep will be here confirmed on 08/01/19 CS  . ILEOSTOMY    . KNEE ARTHROPLASTY Left 12/14/2015   Procedure: COMPUTER ASSISTED LEFT TOTAL KNEE ARTHROPLASTY;  Surgeon: Jessy Oto, MD;  Location: [facility];  Service: Orthopedics;  Laterality: Left;  . RIGHT HEART CATH N/A 02/11/2020   Procedure: RIGHT HEART CATH;  Surgeon: Martinique, Peter M, MD;  Location: [facility];  Service: Cardiovascular;  Laterality: N/A;  . TOTAL COLECTOMY      total colectomy in 1977  . TOTAL KNEE ARTHROPLASTY Left 12/14/2015  . TOTAL KNEE ARTHROPLASTY Right 06/27/2016  . TOTAL KNEE ARTHROPLASTY Right 06/27/2016   Procedure: RIGHT TOTAL KNEE ARTHROPLASTY;  Surgeon: Jessy Oto, MD;  Location: [facility];  Service: Orthopedics;  Laterality: Right;  . TUBAL LIGATION       OB History    Gravida  5   Para  5   Term  5   Preterm      AB      Living  5     SAB      TAB      Ectopic      Multiple      Live Births  5           Family History  Problem Relation Age of Onset  . Hypertension Sister   . Glaucoma Sister   . Endometrial cancer Neg Hx   . Ovarian cancer Neg Hx     Social History   Tobacco Use  . Smoking status: Never Smoker  . Smokeless tobacco: Never Used  Vaping Use  . Vaping Use: Never used  Substance Use Topics  . Alcohol use: No    Alcohol/week: 0.0 standard drinks  . Drug use: No    Home Medications Prior to Admission medications   Medication Sig Start Date  End Date Taking? Authorizing Provider  albuterol (PROAIR HFA) 108 (90 Base) MCG/ACT inhaler Inhale 1 puff into the lungs every 6 (six) hours as needed for wheezing or shortness of breath.   Yes [provider]  albuterol (PROVENTIL) (2.5 MG/3ML) 0.083% nebulizer solution Take 2.5 mg by nebulization 2 (two) times daily as needed for wheezing or shortness of breath.    Yes [provider]  allopurinol (ZYLOPRIM) 100 MG tablet TAKE 1 TABLET(100 MG) BY MOUTH DAILY Patient taking differently: Take 100 mg by mouth as needed (gout).  04/01/19  Yes [name], MD  amiodarone (PACERONE) 200 MG tablet Take 1 tablet (200 mg total) by mouth daily. 11/21/19  Yes Fenton, Clint R, PA  brimonidine (ALPHAGAN) 0.2 % ophthalmic solution  Place 1 drop into the right eye 3 (three) times daily. 06/18/19  Yes [provider]  brinzolamide (AZOPT) 1 % ophthalmic suspension Place 1 drop into the right eye 3 (three) times daily.    Yes [provider]  colchicine 0.6 MG tablet Take 2 tabs (1.2 mg) at the onset of a gout attack, may repeat 1 tab (0.6 mg) 2 hours later if symptoms persist. 10/22/18  Yes [name], MD  diltiazem (CARDIZEM CD) 360 MG 24 hr capsule Take 1 capsule (360 mg total) by mouth daily. 11/13/19 11/12/20 Yes Fenton, Clint R, PA  fluocinonide ointment (LIDEX) 5.32 % Apply 1 application topically 2 (two) times daily as needed for itching. 07/24/19  Yes [provider]  Fluticasone-Salmeterol (ADVAIR DISKUS) 500-50 MCG/DOSE AEPB Inhale 1 puff into the lungs in the morning and at bedtime. 11/27/19  Yes Olalere, Adewale A, MD  furosemide (LASIX) 40 MG tablet Take 1 tablet (40 mg total) by mouth 2 (two) times daily. 02/14/20  Yes Furth, Cadence H, PA-C  LUMIGAN 0.01 % SOLN Place 1 drop into both eyes at bedtime. 05/27/16  Yes [provider]  medroxyPROGESTERone (PROVERA) 10 MG tablet Take 2 tablets (20 mg total) by mouth daily. 01/22/20  Yes Everitt Amber, MD    potassium chloride (KLOR-CON) 10 MEQ tablet Take 4 tablets (40 mEq total) by mouth 2 (two) times daily for 5 days. Patient taking differently: Take 20 mEq by mouth 2 (two) times daily.  12/06/19 02/23/29 Yes Allred, Jeneen Rinks, MD  prochlorperazine (COMPAZINE) 10 MG tablet TAKE 1 TABLET(10 MG) BY MOUTH TWICE DAILY AS NEEDED FOR NAUSEA OR VOMITING Patient taking differently: Take 10 mg by mouth as needed for nausea or vomiting.  11/01/18  Yes [name], MD    Allergies    Megace es [megestrol acetate] and Sulfonamide derivatives  Review of Systems   Review of Systems  Constitutional: Positive for activity change and fatigue. Negative for fever.  HENT: Negative for congestion.   Respiratory: Positive for shortness of breath. Negative for cough.   Cardiovascular: Negative for chest pain.  Gastrointestinal: Negative for abdominal pain, nausea and vomiting.  Neurological: Negative for loss of consciousness and headaches.  All other systems reviewed and are negative.   Physical Exam Updated Vital Signs BP 106/79   Pulse 83   Temp 98.2 F (36.8 C) (Oral)   Resp 17   Ht 5' 2"  (1.575 m)   Wt 114.6 kg   SpO2 94%   BMI 46.21 kg/m   Physical Exam Vitals and nursing note reviewed.  Constitutional:      General: She is not in acute distress.    Appearance: She is well-developed. She is ill-appearing.  HENT:     Head: Normocephalic and atraumatic.     Mouth/Throat:     Mouth: Mucous membranes are moist.  Eyes:     Extraocular Movements: Extraocular movements intact.     Conjunctiva/sclera: Conjunctivae normal.     Pupils: Pupils are equal, round, and reactive to light.  Cardiovascular:     Rate and Rhythm: Normal rate and regular rhythm.     Heart sounds: No murmur heard.   Pulmonary:     Effort: Pulmonary effort is normal. Tachypnea present. No respiratory distress.     Breath sounds: Rales present.  Abdominal:     Palpations: Abdomen is soft.     Tenderness: There is no  abdominal tenderness.  Musculoskeletal:        General: Normal range of  motion.     Cervical back: Neck supple.     Right lower leg: Edema present.     Left lower leg: Edema present.  Skin:    General: Skin is warm and dry.  Neurological:     General: No focal deficit present.     Mental Status: She is alert and oriented to person, place, and time.  Psychiatric:        Mood and Affect: Mood normal.        Behavior: Behavior normal.     ED Results / Procedures / Treatments   Labs (all labs ordered are listed, but only abnormal results are displayed) Labs Reviewed  COMPREHENSIVE METABOLIC PANEL - Abnormal; Notable for the following components:      Result Value   BUN 46 (*)    Creatinine, Ser 5.20 (*)    Calcium 8.8 (*)    Albumin 3.1 (*)    Total Bilirubin 1.4 (*)    GFR calc non Af Amer 7 (*)    GFR calc Af Amer 9 (*)    All other components within normal limits  CBC WITH DIFFERENTIAL/PLATELET - Abnormal; Notable for the following components:   Hemoglobin 11.2 (*)    RDW 16.8 (*)    Monocytes Absolute 1.7 (*)    All other components within normal limits  BRAIN NATRIURETIC PEPTIDE - Abnormal; Notable for the following components:   B Natriuretic Peptide 324.8 (*)    All other components within normal limits  I-STAT CHEM 8, ED - Abnormal; Notable for the following components:   BUN 47 (*)    Creatinine, Ser 5.60 (*)    Calcium, Ion 1.08 (*)    All other components within normal limits  SARS CORONAVIRUS 2 BY RT PCR (HOSPITAL ORDER, [facility])  URINALYSIS, COMPLETE (UACMP) WITH MICROSCOPIC    EKG EKG Interpretation  Date/Time:  Monday February 24 2020 18:28:25 EDT Ventricular Rate:  89 PR Interval:    QRS Duration: 98 QT Interval:  374 QTC Calculation: 456 R Axis:   2 Text Interpretation: Sinus or ectopic atrial rhythm Low voltage, precordial leads Abnormal R-wave progression, early transition Consider anterior infarct Confirmed by Merrily Pew [phone]) on 02/24/2020 8:07:51 PM   Radiology DG Chest Port 1 View  Result Date: 02/24/2020 CLINICAL DATA:  Shortness of breath EXAM: PORTABLE CHEST 1 VIEW COMPARISON:  02/04/2020 FINDINGS: Mild cardiomegaly with vascular congestion. Mild diffuse interstitial and ground-glass opacity. Suspected small left effusion. No pneumothorax. IMPRESSION: 1. Cardiomegaly with vascular congestion and mild diffuse interstitial and ground-glass opacities suggestive of edema 2. Suspected small left effusion Electronically Signed   By: Donavan Foil M.D.   On: 02/24/2020 19:19    Procedures Procedures (including critical care time)  Medications Ordered in ED Medications - No data to display  ED Course  I have reviewed the triage vital signs and the nursing notes.  Pertinent labs & imaging results that were available during my care of the patient were reviewed by me and considered in my medical decision making (see chart for details).  Clinical Course as of Feb 25 1319  Mon Feb 24, 2020  2219 Patient checked on, no acute distress.  Updated on plan for admission for CHF exacerbation, worsening kidney function.   [AL]    Clinical Course User Index [AL] Delma Post, MD   MDM Rules/Calculators/A&P  Differential diagnosis: CHF exacerbation, pneumonia, electrolyte abnormality, worsening kidney function  ED physician interpretation of imaging: Pulmonary edema.  No hemopneumothorax, widening stone or focal ammonia. ED physician interpretation of EKG: No STEMI.  ED physician interpretation of labs: Patient BNP elevated above recent levels consistent with CHF exacerbation.  Patient's kidney function significantly worse from recent hospitalization concern for AKI, worsening CKD  MDM: Patient is a 79 year old female with multiple chronic medical problems presenting with worsening shortness of breath, foot swelling found to have acute on chronic congestive heart failure as well as  significant worsening of her renal function consistent with possible cardiorenal syndrome requiring hospitalization for further treatment evaluation.  Patient's vital signs are stable, patient afebrile.  Patient's physical exams concerning for tachypnea, foot swelling and rales in lung fields.  As patient was discharged from the cardiology service they were consulted but based on her worsening kidney function they feel she is better served hospitalist service.  They will consult and provide recommendations.  Hospitalist service contacted inpatient medical service for further treatment and evaluation.  Diagnosis, treatment and plan of care was discussed and agreed upon with patient.  Patient comfortable with admission at this time.   Final Clinical Impression(s) / ED Diagnoses Final diagnoses:  Acute on chronic congestive heart failure, unspecified heart failure type ([place])  AKI (acute kidney injury) ([place])  Chronic kidney disease, unspecified CKD stage  SOB (shortness of breath)    Rx / DC Orders ED Discharge Orders    None       Delma Post, MD 02/25/20 1320    Mesner, Corene Cornea, MD 03/01/20 2043

## 2020-02-25 ENCOUNTER — Encounter (HOSPITAL_COMMUNITY): Payer: Medicare Other

## 2020-02-25 LAB — TSH: TSH: 2.29 u[IU]/mL (ref 0.350–4.500)

## 2020-02-25 LAB — PROTEIN / CREATININE RATIO, URINE
Creatinine, Urine: 261.97 mg/dL
Protein Creatinine Ratio: 0.5 mg/mg{Cre} — ABNORMAL HIGH (ref 0.00–0.15)
Total Protein, Urine: 132 mg/dL

## 2020-02-25 LAB — SODIUM, URINE, RANDOM: Sodium, Ur: <10 mmol/L

## 2020-02-25 LAB — SARS CORONAVIRUS 2 BY RT PCR (HOSPITAL ORDER, PERFORMED IN ~~LOC~~ HOSPITAL LAB): SARS Coronavirus 2: NEGATIVE

## 2020-02-25 LAB — HEPATITIS B SURFACE ANTIGEN: Hepatitis B Surface Ag: NONREACTIVE

## 2020-02-25 LAB — CREATININE, URINE, RANDOM: Creatinine, Urine: 262.58 mg/dL

## 2020-02-25 MED ORDER — ACETAMINOPHEN 325 MG PO TABS: 650.0000 mg | ORAL_TABLET | ORAL | Status: DC | PRN

## 2020-02-25 MED ORDER — FUROSEMIDE 10 MG/ML IJ SOLN
40.0000 mg | Freq: Two times a day (BID) | INTRAMUSCULAR | Status: DC
  Administered 2020-02-25 (×3): 40 mg via INTRAVENOUS
  Filled 2020-02-25 (×3): qty 4

## 2020-02-25 MED ORDER — SODIUM CHLORIDE 0.9% FLUSH
3.0000 mL | Freq: Two times a day (BID) | INTRAVENOUS | Status: DC
  Administered 2020-02-25 – 2020-03-05 (×17): 3 mL via INTRAVENOUS

## 2020-02-25 MED ORDER — SODIUM CHLORIDE 0.9% FLUSH
3.0000 mL | INTRAVENOUS | Status: DC | PRN
  Administered 2020-03-05: 3 mL via INTRAVENOUS

## 2020-02-25 MED ORDER — MOMETASONE FURO-FORMOTEROL FUM 200-5 MCG/ACT IN AERO
2.0000 | INHALATION_SPRAY | Freq: Two times a day (BID) | RESPIRATORY_TRACT | Status: DC
  Administered 2020-02-25 – 2020-03-05 (×19): 2 via RESPIRATORY_TRACT
  Filled 2020-02-25: qty 8.8

## 2020-02-25 MED ORDER — DILTIAZEM HCL ER COATED BEADS 180 MG PO CP24
360.0000 mg | ORAL_CAPSULE | Freq: Every day | ORAL | Status: DC
  Administered 2020-02-25 – 2020-02-26 (×2): 360 mg via ORAL
  Filled 2020-02-25 (×2): qty 2

## 2020-02-25 MED ORDER — BRINZOLAMIDE 1 % OP SUSP
1.0000 [drp] | Freq: Three times a day (TID) | OPHTHALMIC | Status: DC
  Administered 2020-02-25 – 2020-03-05 (×25): 1 [drp] via OPHTHALMIC
  Filled 2020-02-25: qty 10

## 2020-02-25 MED ORDER — ONDANSETRON HCL 4 MG/2ML IJ SOLN
4.0000 mg | Freq: Four times a day (QID) | INTRAMUSCULAR | Status: DC | PRN
  Administered 2020-03-03: 4 mg via INTRAVENOUS
  Filled 2020-02-25: qty 2

## 2020-02-25 MED ORDER — BRIMONIDINE TARTRATE 0.2 % OP SOLN
1.0000 [drp] | Freq: Three times a day (TID) | OPHTHALMIC | Status: DC
  Administered 2020-02-25 – 2020-02-26 (×4): 1 [drp] via OPHTHALMIC
  Filled 2020-02-25: qty 5

## 2020-02-25 MED ORDER — SODIUM CHLORIDE 0.9 % IV SOLN: 250.0000 mL | INTRAVENOUS | Status: DC | PRN

## 2020-02-25 MED ORDER — ALBUTEROL SULFATE (2.5 MG/3ML) 0.083% IN NEBU: 2.5000 mg | INHALATION_SOLUTION | Freq: Four times a day (QID) | RESPIRATORY_TRACT | Status: DC | PRN

## 2020-02-25 MED ORDER — LATANOPROST 0.005 % OP SOLN
1.0000 [drp] | Freq: Every day | OPHTHALMIC | Status: DC
  Administered 2020-02-25 – 2020-03-04 (×9): 1 [drp] via OPHTHALMIC
  Filled 2020-02-25: qty 2.5

## 2020-02-25 MED ORDER — AMIODARONE HCL 200 MG PO TABS
200.0000 mg | ORAL_TABLET | Freq: Every day | ORAL | Status: DC
  Administered 2020-02-25 – 2020-03-02 (×7): 200 mg via ORAL
  Filled 2020-02-25 (×5): qty 1

## 2020-02-25 MED ORDER — MEDROXYPROGESTERONE ACETATE 10 MG PO TABS
20.0000 mg | ORAL_TABLET | Freq: Every day | ORAL | Status: DC
  Administered 2020-02-25 – 2020-03-05 (×10): 20 mg via ORAL
  Filled 2020-02-25 (×10): qty 2

## 2020-02-25 MED ORDER — HEPARIN SODIUM (PORCINE) 5000 UNIT/ML IJ SOLN
5000.0000 [IU] | Freq: Three times a day (TID) | INTRAMUSCULAR | Status: DC
  Administered 2020-02-25 – 2020-03-04 (×27): 5000 [IU] via SUBCUTANEOUS
  Filled 2020-02-25 (×28): qty 1

## 2020-02-25 NOTE — Progress Notes (Signed)
[facility] PROGRESS NOTE   Jackie Parker JYN:[id number] DOB: 11-Jan-1941 DOA: 02/24/2020  PCP: [name], MD  Brief History/Interval Summary: 79 y.o. female with medical history significant for chronic diastolic CHF, atrial fibrillation not anticoagulated due to bleeding, chronic kidney disease stage IIIb, asthma, and ulcerative colitis status post remote colectomy, presenting to the emergency department for evaluation of shortness of breath and swelling.  The patient was recently admitted to the hospital and discharged on 02/14/2020 after she was diuresed.  She underwent echocardiogram and right heart catheterization during the recent admission, put out 9 L of urine, and was in much improved condition when she went home.  Since returning home, patient has become progressively more short of breath despite adherence with her diuretics.  She has not noticed much change in her bilateral lower extremity swelling but reports that the right arm has become swollen which is new for her.  She denies chest pain, fevers, or chills, and reports that she has not been coughing much.  ED Course: Upon arrival to the ED, patient is found to be afebrile, saturating low 90s on room air, and with stable blood pressure.  EKG features sinus or ectopic atrial rhythm.  Chest x-ray with cardiomegaly, vascular congestion, and interstitial edema.  Chemistry panel is concerning for creatinine of 5.20, up from 3.9 at time of recent discharge.  CBC unremarkable.  BNP elevated to 325.  COVID-19 screening test was negative.  Cardiology was consulted by the ED physician and recommended medical admission.   Reason for Visit: Acute diastolic CHF.  Acute kidney injury on chronic kidney disease  Consultants: Cardiology.  Nephrology  Procedures: None yet  Antibiotics: Anti-infectives (From admission, onward)   None      Subjective/Interval History: Patient sitting at the edge of the bed.  Noted to be short of  breath.  Denies any chest pain.  She says that the leg swelling has improved but she is feeling short of breath.  Symptoms suggestive of orthopnea.  ROS: Denies any nausea or vomiting.    Assessment/Plan:  Acute on chronic diastolic CHF Chest x-ray showed pulmonary edema.  Not requiring any supplemental oxygen currently.  Strict ins and outs, daily weights.  She is on diuretics.  Echocardiogram done in May showed normal systolic function.  He underwent cardiac catheterization as well which shows moderate pulmonary hypertension.  Cardiology is following.  TSH was 1.6 in March.  Acute kidney injury on chronic kidney disease stage IIIb-IV Baseline creatinine noted to be around 2.0.  It was 3.94 at the time of recent discharge.  Creatinine noted to be 5.2 on admission.  Significant worsening noted.  Monitor urine output.  Nephrology consulted to assist with management.  UA shows hemoglobin, RBC, as well as protein.  Work-up in progress.  Abnormal UA Proteinuria noted.  Large hemoglobin large leukocytes many bacteria.  Greater than 50 RBCs and greater than 50 WBCs.  Patient however denies any dysuria, urgency or frequency.  Hold off on antibiotics.  History of paroxysmal atrial fibrillation Patient is not anticoagulation due to history of uterine bleeding.  Continue amiodarone and diltiazem.  History of asthma Continue inhalers.  Right arm swelling No recent trauma.  Able to move her upper extremity.  Ultrasound is pending.  Previous history of ulcerative colitis status post colectomy She has a colostomy in place.  Abdomen is benign.  Morbid obesity Estimated body mass index is 46.21 kg/m as calculated from the following:   Height as of this encounter: 5'  2" (1.575 m).   Weight as of this encounter: 114.6 kg.   DVT Prophylaxis: Subcutaneous heparin Code Status: Full code Family Communication: No family at bedside Disposition Plan:  Status is: Inpatient  Remains inpatient  appropriate because:IV treatments appropriate due to intensity of illness or inability to take PO and Inpatient level of care appropriate due to severity of illness   Dispo: The patient is from: Home              Anticipated d/c is to: To be determined              Anticipated d/c date is: > 3 days              Patient currently is not medically stable to d/c.      Medications:  Scheduled: . amiodarone  200 mg Oral Daily  . brimonidine  1 drop Right Eye TID  . brinzolamide  1 drop Right Eye TID  . diltiazem  360 mg Oral Daily  . furosemide  40 mg Intravenous Q12H  . heparin  5,000 Units Subcutaneous Q8H  . latanoprost  1 drop Both Eyes QHS  . mometasone-formoterol  2 puff Inhalation BID  . sodium chloride flush  3 mL Intravenous Q12H   Continuous: . sodium chloride     IRW:ERXVQM chloride, acetaminophen, albuterol, ondansetron (ZOFRAN) IV, sodium chloride flush   Objective:  Vital Signs  Vitals:   02/25/20 0146 02/25/20 0413 02/25/20 0824 02/25/20 0825  BP: (!) 109/53 (!) 91/50  106/79  Pulse: 82 83 83 83  Resp: 20 20 16 17   Temp: 97.8 F (36.6 C) 98.2 F (36.8 C)    TempSrc:  Oral    SpO2: 93% 93% 94%   Weight: 114.6 kg     Height: 5' 2"  (1.575 m)       Intake/Output Summary (Last 24 hours) at 02/25/2020 1102 Last data filed at 02/25/2020 0700 Gross per 24 hour  Intake 240 ml  Output 25 ml  Net 215 ml   Filed Weights   02/25/20 0146  Weight: 114.6 kg    General appearance: Awake alert.  In no distress.  Morbidly obese Resp: Noted to be tachypneic at rest.  No use of accessory muscles.  Crackles heard bilateral bases about half the way up the lung fields.  No wheezing or rhonchi.   Cardio: S1-S2 is normal regular.  No S3-S4.  No rubs murmurs or bruit GI: Abdomen is soft.  Ostomy bag noted with brown stool.  Nontender nondistended.  Bowel sounds are present normal.  No masses organomegaly Extremities: Edema noted bilateral lower extremities.  Also noted in  the right upper extremity. Neurologic: Alert and oriented x3.  No focal neurological deficits.    Lab Results:  Data Reviewed: I have personally reviewed following labs and imaging studies  CBC: Recent Labs  Lab 02/24/20 1945 02/24/20 1956  WBC 9.8  --   NEUTROABS 6.0  --   HGB 11.2* 12.9  HCT 36.6 38.0  MCV 94.6  --   PLT 157  --     Basic Metabolic Panel: Recent Labs  Lab 02/24/20 1945 02/24/20 1956  NA 137 138  K 4.3 4.2  CL 103 106  CO2 22  --   GLUCOSE 98 99  BUN 46* 47*  CREATININE 5.20* 5.60*  CALCIUM 8.8*  --     GFR: Estimated Creatinine Clearance: 9.9 mL/min (A) (by C-G formula based on SCr of 5.6 mg/dL (H)).  Liver  Function Tests: Recent Labs  Lab 02/24/20 1945  AST 27  ALT 19  ALKPHOS 47  BILITOT 1.4*  PROT 8.1  ALBUMIN 3.1*     Recent Results (from the past 240 hour(s))  SARS CORONAVIRUS 2 (TAT 6-24 HRS) Nasopharyngeal Nasopharyngeal Swab     Status: None   Collection Time: 02/22/20 12:02 PM   Specimen: Nasopharyngeal Swab  Result Value Ref Range Status   SARS Coronavirus 2 NEGATIVE NEGATIVE Final    Comment: (NOTE) SARS-CoV-2 target nucleic acids are NOT DETECTED.  The SARS-CoV-2 RNA is generally detectable in upper and lower respiratory specimens during the acute phase of infection. Negative results do not preclude SARS-CoV-2 infection, do not rule out co-infections with other pathogens, and should not be used as the sole basis for treatment or other patient management decisions. Negative results must be combined with clinical observations, patient history, and epidemiological information. The expected result is Negative.  Fact Sheet for Patients: SugarRoll.be  Fact Sheet for Healthcare Providers: https://www.woods-mathews.com/  This test is not yet approved or cleared by the [place] FDA and  has been authorized for detection and/or diagnosis of SARS-CoV-2 by FDA under an  Emergency Use Authorization (EUA). This EUA will remain  in effect (meaning this test can be used) for the duration of the COVID-19 declaration under Se ction 564(b)(1) of the Act, 21 U.S.C. section 360bbb-3(b)(1), unless the authorization is terminated or revoked sooner.  Performed at [facility], [place] [address], [place], [place] [address]   SARS Coronavirus 2 by RT PCR (hospital order, performed in [facility] hospital lab) Nasopharyngeal Nasopharyngeal Swab     Status: None   Collection Time: 02/24/20 10:55 PM   Specimen: Nasopharyngeal Swab  Result Value Ref Range Status   SARS Coronavirus 2 NEGATIVE NEGATIVE Final    Comment: (NOTE) SARS-CoV-2 target nucleic acids are NOT DETECTED.  The SARS-CoV-2 RNA is generally detectable in upper and lower respiratory specimens during the acute phase of infection. The lowest concentration of SARS-CoV-2 viral copies this assay can detect is 250 copies / mL. A negative result does not preclude SARS-CoV-2 infection and should not be used as the sole basis for treatment or other patient management decisions.  A negative result may occur with improper specimen collection / handling, submission of specimen other than nasopharyngeal swab, presence of viral mutation(s) within the areas targeted by this assay, and inadequate number of viral copies (<250 copies / mL). A negative result must be combined with clinical observations, patient history, and epidemiological information.  Fact Sheet for Patients:   StrictlyIdeas.no  Fact Sheet for Healthcare Providers: BankingDealers.co.za  This test is not yet approved or  cleared by the [place] FDA and has been authorized for detection and/or diagnosis of SARS-CoV-2 by FDA under an Emergency Use Authorization (EUA).  This EUA will remain in effect (meaning this test can be used) for the duration of the COVID-19 declaration under Section  564(b)(1) of the Act, 21 U.S.C. section 360bbb-3(b)(1), unless the authorization is terminated or revoked sooner.  Performed at [facility], [place] [address], [place], [place] [address]       Radiology Studies: DG Chest Port 1 View  Result Date: 02/24/2020 CLINICAL DATA:  Shortness of breath EXAM: PORTABLE CHEST 1 VIEW COMPARISON:  02/04/2020 FINDINGS: Mild cardiomegaly with vascular congestion. Mild diffuse interstitial and ground-glass opacity. Suspected small left effusion. No pneumothorax. IMPRESSION: 1. Cardiomegaly with vascular congestion and mild diffuse interstitial and ground-glass opacities suggestive of  edema 2. Suspected small left effusion Electronically Signed   By: Donavan Foil M.D.   On: 02/24/2020 19:19       LOS: 1 day   [facility] Pager on [email or web address]  02/25/2020, 11:02 AM

## 2020-02-25 NOTE — Progress Notes (Signed)
Pt stable in bed. No signs of distress noted. Bedside report received at bedside. No needs or concerns at this time. Colostomy emptied by Graciella Freer RN just prior to shift change. Will continue to monitor.

## 2020-02-25 NOTE — Consult Note (Addendum)
[facility] Renal Consultation Note  Requesting MD:  Indication for Consultation: Acute on chronic kidney injury, maintenance of euvolemia, treatment and assessment of electrolyte abnormalities, treatment assessment of acid-base abnormalities.  Medical management of chronic kidney disease  HPI:   Jackie Parker is a 79 y.o. female.  She has a history of diastolic heart failure persistent atrial fibrillation on amiodarone but no anticoagulation secondary to uterine bleeding.  She is chronic kidney disease with baseline creatinine that appears to be about 1.5 to 2 mg/dL as of November 2020.  She was recently hospitalized for volume overload 02/03/2020 and discharged on 02/14/2020.  During that admission had been a 50 pound weight gain thought to be volume overload with congestive heart failure and renal failure.  She was aggressively diuresed with IV Lasix and metolazone.  Right heart catheterization 02/11/2020 showed moderate pulmonary venous hypertension.  She has had a preserved ejection fraction.  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  No identified nephrotoxins.  Discharge dose of Lasix 40 mg twice daily.  She presents with shortness of breath and worsening renal function.  Blood pressure 91/50 pulse 85 temperature 98.3 O2 sats 93% room air.  Sodium 138 potassium 4.2 chloride 106 CO2 22 BUN 47 creatinine 5.6 albumin 3.1 hemoglobin 12.9.  Medications amiodarone 200 mg daily diltiazem 360 mg daily Lasix 40 mg IV every 12 hours.  There appears to be no recorded urine output.  Weight 114.6 kg  Chest x-ray 02/24/2020 cardiomegaly with vascular congestion and diffuse interstitial groundglass opacities suggestive of edema with a small left-sided effusion  Reviewing labs it does appear that there is a positive ANA in 2018    Creat  Date/Time Value Ref Range Status  11/09/2016 10:54 AM 1.16 (H) 0.60 - 0.93 mg/dL Final    Comment:       For patients > or = 79 years of age: The upper reference limit for Creatinine is approximately 13% higher for people identified as African-American.      Creatinine, Ser  Date/Time Value Ref Range Status  02/24/2020 07:56 PM 5.60 (H) 0.44 - 1.00 mg/dL Final  02/24/2020 07:45 PM 5.20 (H) 0.44 - 1.00 mg/dL Final  02/14/2020 04:48 AM 3.94 (H) 0.44 - 1.00 mg/dL Final  02/13/2020 03:48 AM 3.92 (H) 0.44 - 1.00 mg/dL Final  02/12/2020 10:52 AM 3.70 (H) 0.44 - 1.00 mg/dL Final  02/11/2020 04:53 AM 3.37 (H) 0.44 - 1.00 mg/dL Final  02/10/2020 04:52 AM 3.05 (H) 0.44 - 1.00 mg/dL Final  02/09/2020 06:30 AM 2.85 (H) 0.44 - 1.00 mg/dL Final  02/08/2020 08:19 AM 3.07 (H) 0.44 - 1.00 mg/dL Final  02/07/2020 04:05 AM 3.18 (H) 0.44 - 1.00 mg/dL Final  02/06/2020 05:56 AM 3.40 (H) 0.44 - 1.00 mg/dL Final  02/05/2020 06:06 AM 2.93 (H) 0.44 - 1.00 mg/dL Final  02/04/2020 05:00 AM 2.89 (H) 0.44 - 1.00 mg/dL Final  02/03/2020 06:44 PM 3.01 (H) 0.44 - 1.00 mg/dL Final  01/01/2020 02:20 PM 2.04 (H) 0.57 - 1.00 mg/dL Final  12/23/2019 11:31 AM 2.41 (H) 0.57 - 1.00 mg/dL Final  11/25/2019 02:54 PM 2.14 (H) 0.44 - 1.00 mg/dL Final  10/14/2019 03:39 PM 1.35 (H) 0.44 - 1.00 mg/dL Final  10/07/2019 04:13 PM 1.66 (H) 0.44 - 1.00 mg/dL Final  08/06/2019 04:22 AM 2.07 (H) 0.44 - 1.00 mg/dL Final  08/05/2019 03:31 AM 2.03 (H) 0.44 - 1.00 mg/dL Final  08/04/2019 09:28 AM 1.96 (H) 0.44 - 1.00 mg/dL  Final  08/04/2019 02:55 AM 1.97 (H) 0.44 - 1.00 mg/dL Final  08/03/2019 02:50 AM 1.90 (H) 0.44 - 1.00 mg/dL Final  08/02/2019 02:46 AM 1.68 (H) 0.44 - 1.00 mg/dL Final  08/01/2019 11:44 AM 1.73 (H) 0.44 - 1.00 mg/dL Final  08/01/2019 11:44 AM 1.68 (H) 0.44 - 1.00 mg/dL Final  04/26/2019 09:58 AM 1.63 (H) 0.57 - 1.00 mg/dL Final  08/21/2018 12:11 PM 1.58 (H) 0.57 - 1.00 mg/dL Final  06/08/2018 09:13 AM 1.40 (H) 0.57 - 1.00 mg/dL Final  03/22/2018 10:53 AM 1.42 (H) 0.44 - 1.00 mg/dL Final  10/16/2017 08:44 AM 1.10 (H)  0.57 - 1.00 mg/dL Final  05/08/2017 04:34 PM 1.15 (H) 0.57 - 1.00 mg/dL Final  02/04/2017 02:15 AM 1.28 (H) 0.44 - 1.00 mg/dL Final  07/01/2016 09:21 AM 1.36 (H) 0.44 - 1.00 mg/dL Final  06/30/2016 08:00 AM 1.52 (H) 0.44 - 1.00 mg/dL Final  06/29/2016 07:16 AM 1.65 (H) 0.44 - 1.00 mg/dL Final  06/28/2016 09:20 AM 1.48 (H) 0.44 - 1.00 mg/dL Final  06/23/2016 12:02 PM 1.14 (H) 0.44 - 1.00 mg/dL Final  12/04/2015 12:53 PM 1.45 (H) 0.44 - 1.00 mg/dL Final  09/03/2013 04:45 AM 1.38 (H) 0.50 - 1.10 mg/dL Final  09/02/2013 05:25 AM 1.48 (H) 0.50 - 1.10 mg/dL Final  09/01/2013 05:37 AM 1.82 (H) 0.50 - 1.10 mg/dL Final  08/31/2013 04:38 AM 2.28 (H) 0.50 - 1.10 mg/dL Final  08/30/2013 06:30 PM 1.78 (H) 0.50 - 1.10 mg/dL Final  08/30/2013 06:30 AM 1.57 (H) 0.50 - 1.10 mg/dL Final  08/29/2013 11:33 PM 1.64 (H) 0.50 - 1.10 mg/dL Final  02/18/2010 12:00 AM 1.30 (H) 0.40 - 1.20 mg/dL Final  07/25/2009 09:00 PM 1.80 (H) 0.40 - 1.20 mg/dL Final  07/01/2008 12:06 AM 1.16 0.40 - 1.20 mg/dL Final  05/20/2008 04:20 AM 1.18  Final  05/19/2008 08:10 PM 1.1  Final     PMHx:   Past Medical History:  Diagnosis Date  . AKI (acute kidney injury) ([place]) 12/2015  . Allergy    takes Singulair and Zyrtec daily  . Asthma    uses Adair daily  . Cataracts, bilateral   . CHF (congestive heart failure) ([place])   . Colitis, ulcerative ([place])   . DJD (degenerative joint disease)   . Glaucoma    uses eye drops daily  . History of blood transfusion    no abnormal reaction noted. 40+ yrs ago  . History of bronchitis    yrs ago  . History of gout    not on any meds  . Hypertension    takes Amlodipine daily  . Joint pain   . Joint swelling   . OSA (obstructive sleep apnea) 12/17/2019  . PMB (postmenopausal bleeding)   . Psoriasis   . Urinary frequency    takes Ditropan daily  . Vertigo    doesn't take any meds  . Vitamin D deficiency     Past Surgical History:  Procedure Laterality Date  . BACK SURGERY     . COLONOSCOPY    . DILATATION & CURETTAGE/HYSTEROSCOPY WITH MYOSURE N/A 08/05/2019   Procedure: DILATATION & CURETTAGE/HYSTEROSCOPY WITH MYOSURE;  Surgeon: Christophe Louis, MD;  Location: [place];  Service: Gynecology;  Laterality: N/A;  Myosure rep will be here confirmed on 08/01/19 CS  . ILEOSTOMY    . KNEE ARTHROPLASTY Left 12/14/2015   Procedure: COMPUTER ASSISTED LEFT TOTAL KNEE ARTHROPLASTY;  Surgeon: Jessy Oto, MD;  Location: [facility];  Service: Orthopedics;  Laterality: Left;  .  RIGHT HEART CATH N/A 02/11/2020   Procedure: RIGHT HEART CATH;  Surgeon: Martinique, Peter M, MD;  Location: [facility];  Service: Cardiovascular;  Laterality: N/A;  . TOTAL COLECTOMY      total colectomy in 1977  . TOTAL KNEE ARTHROPLASTY Left 12/14/2015  . TOTAL KNEE ARTHROPLASTY Right 06/27/2016  . TOTAL KNEE ARTHROPLASTY Right 06/27/2016   Procedure: RIGHT TOTAL KNEE ARTHROPLASTY;  Surgeon: Jessy Oto, MD;  Location: [facility];  Service: Orthopedics;  Laterality: Right;  . TUBAL LIGATION      Family Hx:  Family History  Problem Relation Age of Onset  . Hypertension Sister   . Glaucoma Sister   . Endometrial cancer Neg Hx   . Ovarian cancer Neg Hx     Social History:  reports that she has never smoked. She has never used smokeless tobacco. She reports that she does not drink alcohol and does not use drugs.  Allergies:  Allergies  Allergen Reactions  . Megace Es [Megestrol Acetate] Shortness Of Breath  . Sulfonamide Derivatives Rash    Medications: Prior to Admission medications   Medication Sig Start Date End Date Taking? Authorizing Provider  albuterol (PROAIR HFA) 108 (90 Base) MCG/ACT inhaler Inhale 1 puff into the lungs every 6 (six) hours as needed for wheezing or shortness of breath.   Yes [provider]  albuterol (PROVENTIL) (2.5 MG/3ML) 0.083% nebulizer solution Take 2.5 mg by nebulization 2 (two) times daily as needed for wheezing or shortness of breath.    Yes [provider]  allopurinol (ZYLOPRIM) 100 MG tablet TAKE 1 TABLET(100 MG) BY MOUTH DAILY Patient taking differently: Take 100 mg by mouth as needed (gout).  04/01/19  Yes [name], MD  amiodarone (PACERONE) 200 MG tablet Take 1 tablet (200 mg total) by mouth daily. 11/21/19  Yes Fenton, Clint R, PA  brimonidine (ALPHAGAN) 0.2 % ophthalmic solution Place 1 drop into the right eye 3 (three) times daily. 06/18/19  Yes [provider]  brinzolamide (AZOPT) 1 % ophthalmic suspension Place 1 drop into the right eye 3 (three) times daily.    Yes [provider]  colchicine 0.6 MG tablet Take 2 tabs (1.2 mg) at the onset of a gout attack, may repeat 1 tab (0.6 mg) 2 hours later if symptoms persist. 10/22/18  Yes [name], MD  diltiazem (CARDIZEM CD) 360 MG 24 hr capsule Take 1 capsule (360 mg total) by mouth daily. 11/13/19 11/12/20 Yes Fenton, Clint R, PA  fluocinonide ointment (LIDEX) 8.11 % Apply 1 application topically 2 (two) times daily as needed for itching. 07/24/19  Yes [provider]  Fluticasone-Salmeterol (ADVAIR DISKUS) 500-50 MCG/DOSE AEPB Inhale 1 puff into the lungs in the morning and at bedtime. 11/27/19  Yes Olalere, Adewale A, MD  furosemide (LASIX) 40 MG tablet Take 1 tablet (40 mg total) by mouth 2 (two) times daily. 02/14/20  Yes Furth, Cadence H, PA-C  LUMIGAN 0.01 % SOLN Place 1 drop into both eyes at bedtime. 05/27/16  Yes [provider]  medroxyPROGESTERone (PROVERA) 10 MG tablet Take 2 tablets (20 mg total) by mouth daily. 01/22/20  Yes Everitt Amber, MD  potassium chloride (KLOR-CON) 10 MEQ tablet Take 4 tablets (40 mEq total) by mouth 2 (two) times daily for 5 days. Patient taking differently: Take 20 mEq by mouth 2 (two) times daily.  12/06/19 02/23/29 Yes Allred, Jeneen Rinks, MD  prochlorperazine (COMPAZINE) 10 MG tablet TAKE 1 TABLET(10 MG) BY MOUTH TWICE DAILY AS  NEEDED FOR NAUSEA OR VOMITING Patient taking differently: Take 10 mg by mouth as  needed for nausea or vomiting.  11/01/18  Yes [name], MD     Labs:  Results for orders placed or performed during the hospital encounter of 02/24/20 (from the past 48 hour(s))  Comprehensive metabolic panel     Status: Abnormal   Collection Time: 02/24/20  7:45 PM  Result Value Ref Range   Sodium 137 135 - 145 mmol/L   Potassium 4.3 3.5 - 5.1 mmol/L   Chloride 103 98 - 111 mmol/L   CO2 22 22 - 32 mmol/L   Glucose, Bld 98 70 - 99 mg/dL    Comment: Glucose reference range applies only to samples taken after fasting for at least 8 hours.   BUN 46 (H) 8 - 23 mg/dL   Creatinine, Ser 5.20 (H) 0.44 - 1.00 mg/dL   Calcium 8.8 (L) 8.9 - 10.3 mg/dL   Total Protein 8.1 6.5 - 8.1 g/dL   Albumin 3.1 (L) 3.5 - 5.0 g/dL   AST 27 15 - 41 U/L   ALT 19 0 - 44 U/L   Alkaline Phosphatase 47 38 - 126 U/L   Total Bilirubin 1.4 (H) 0.3 - 1.2 mg/dL   GFR calc non Af Amer 7 (L) >60 mL/min   GFR calc Af Amer 9 (L) >60 mL/min   Anion gap 12 5 - 15    Comment: Performed at [facility] [address], [place], [place] [address]  CBC WITH DIFFERENTIAL     Status: Abnormal   Collection Time: 02/24/20  7:45 PM  Result Value Ref Range   WBC 9.8 4.0 - 10.5 K/uL   RBC 3.87 3.87 - 5.11 MIL/uL   Hemoglobin 11.2 (L) 12.0 - 15.0 g/dL   HCT 36.6 36 - 46 %   MCV 94.6 80.0 - 100.0 fL   MCH 28.9 26.0 - 34.0 pg   MCHC 30.6 30.0 - 36.0 g/dL   RDW 16.8 (H) 11.5 - 15.5 %   Platelets 157 150 - 400 K/uL   nRBC 0.0 0.0 - 0.2 %   Neutrophils Relative % 61 %   Neutro Abs 6.0 1.7 - 7.7 K/uL   Lymphocytes Relative 19 %   Lymphs Abs 1.9 0.7 - 4.0 K/uL   Monocytes Relative 18 %   Monocytes Absolute 1.7 (H) 0 - 1 K/uL   Eosinophils Relative 1 %   Eosinophils Absolute 0.1 0 - 0 K/uL   Basophils Relative 0 %   Basophils Absolute 0.0 0 - 0 K/uL   Immature Granulocytes 1 %   Abs Immature Granulocytes 0.07 0.00 - 0.07 K/uL    Comment: Performed at [facility], [place] [address], [place], [place] [address]   Brain natriuretic peptide     Status: Abnormal   Collection Time: 02/24/20  7:45 PM  Result Value Ref Range   B Natriuretic Peptide 324.8 (H) 0.0 - 100.0 pg/mL    Comment: Performed at [facility] [address], [place], [place] [address]  I-Stat Chem 8, ED [facility], [place], [place] only)     Status: Abnormal   Collection Time: 02/24/20  7:56 PM  Result Value Ref Range   Sodium 138 135 - 145 mmol/L   Potassium 4.2 3.5 - 5.1 mmol/L   Chloride 106 98 - 111 mmol/L   BUN 47 (H) 8 - 23 mg/dL   Creatinine, Ser 5.60 (H) 0.44 - 1.00 mg/dL  Glucose, Bld 99 70 - 99 mg/dL    Comment: Glucose reference range applies only to samples taken after fasting for at least 8 hours.   Calcium, Ion 1.08 (L) 1.15 - 1.40 mmol/L   TCO2 24 22 - 32 mmol/L   Hemoglobin 12.9 12.0 - 15.0 g/dL   HCT 38.0 36 - 46 %  SARS Coronavirus 2 by RT PCR (hospital order, performed in [facility] hospital lab) Nasopharyngeal Nasopharyngeal Swab     Status: None   Collection Time: 02/24/20 10:55 PM   Specimen: Nasopharyngeal Swab  Result Value Ref Range   SARS Coronavirus 2 NEGATIVE NEGATIVE    Comment: (NOTE) SARS-CoV-2 target nucleic acids are NOT DETECTED.  The SARS-CoV-2 RNA is generally detectable in upper and lower respiratory specimens during the acute phase of infection. The lowest concentration of SARS-CoV-2 viral copies this assay can detect is 250 copies / mL. A negative result does not preclude SARS-CoV-2 infection and should not be used as the sole basis for treatment or other patient management decisions.  A negative result may occur with improper specimen collection / handling, submission of specimen other than nasopharyngeal swab, presence of viral mutation(s) within the areas targeted by this assay, and inadequate number of viral copies (<250 copies / mL). A negative result must be combined with clinical observations, patient history, and epidemiological information.  Fact Sheet for Patients:    StrictlyIdeas.no  Fact Sheet for Healthcare Providers: BankingDealers.co.za  This test is not yet approved or  cleared by the [place] FDA and has been authorized for detection and/or diagnosis of SARS-CoV-2 by FDA under an Emergency Use Authorization (EUA).  This EUA will remain in effect (meaning this test can be used) for the duration of the COVID-19 declaration under Section 564(b)(1) of the Act, 21 U.S.C. section 360bbb-3(b)(1), unless the authorization is terminated or revoked sooner.  Performed at [facility], [place] [address] [place], [place] [address]   Urinalysis, Complete w Microscopic     Status: Abnormal   Collection Time: 02/24/20 11:15 PM  Result Value Ref Range   Color, Urine AMBER (A) YELLOW    Comment: BIOCHEMICALS MAY BE AFFECTED BY COLOR   APPearance CLOUDY (A) CLEAR   Specific Gravity, Urine 1.017 1.005 - 1.030   pH 5.0 5.0 - 8.0   Glucose, UA NEGATIVE NEGATIVE mg/dL   Hgb urine dipstick LARGE (A) NEGATIVE   Bilirubin Urine NEGATIVE NEGATIVE   Ketones, ur NEGATIVE NEGATIVE mg/dL   Protein, ur >=300 (A) NEGATIVE mg/dL   Nitrite NEGATIVE NEGATIVE   Leukocytes,Ua LARGE (A) NEGATIVE   RBC / HPF >50 (H) 0 - 5 RBC/hpf   WBC, UA >50 (H) 0 - 5 WBC/hpf   Bacteria, UA MANY (A) NONE SEEN   Squamous Epithelial / LPF 6-10 0 - 5    Comment: Performed at [facility], [address] [place], [place] [address]     ROS:  General weak and fatigued Eyes no visual complaints blurred vision double vision Ears nose mouth throat no hearing loss epistaxis or sore throat Cardiovascular positive for shortness of breath dyspnea on exertion and lower extremity swelling Pulmonary no cough wheeze emesis Abdominal system abdominal pain nausea vomiting Urogenital no urgency frequency dysuria Neurologic no stroke seizures numbness tingling or weakness upper extremities Skin no rashes or itching Heme no bleeding  disorders or clotting disorders disorders Endocrine no diabetes thyroid or adrenal disease  Physical Exam: Vitals:   02/25/20 0146 02/25/20 0413  BP: (!) 109/53 (!) 91/50  Pulse: 82 83  Resp: 20 20  Temp: 97.8 F (36.6 C) 98.2 F (36.8 C)  SpO2: 93% 93%     General: Weak no acute distress HEENT: Normal Eyes: Pupils round equal reactive no icterus or pallor Neck: Supple slightly elevated JVP no lymphadenopathy Heart: Regular rate and rhythm faint systolic murmur Lungs: Decreased air entry with occasional wheezes rales Abdomen: Soft nontender bowel sounds present no hepatosplenomegaly Extremities: No cyanosis clubbing 1+ pitting edema bilateral Skin: Warm well perfused Neuro: Grossly intact  Assessment/Plan: 1.Acute on chronic renal insufficiency in the setting of worsening congestive heart failure this appears that this has some component of diastolic dysfunction.  Baseline creatinine about 2 mg/dL.  Last urinalysis was positive for greater than 300 mg/dL protein 02/24/2020.  We will quantitate with protein creatinine ratio also noted to have greater than 50 RBCs per high-powered field and greater than 50 WBCs per high-powered film.  We will check serological evaluation including ANA ANCA as well as hepatitis B C and complements.  We will also perform a serum protein electrophoresis and urine electrophoresis as well as lambda kappa light chain ratio.  Renal amyloidosis would be in the differential.  CT scan abdomen and pelvis performed 01/22/2020 showed a 1.6 cm exophytic cyst left kidney which is poorly characterized 2. Hypertension/volume  -appears to have volume overload with congestive heart failure will be interested to see how she responds to IV Lasix.  Will follow creatinine.  Vein mapping has been done in order to facilitate placement of vascular access should this be needed. 3.  Congestive heart failure with diastolic dysfunction.  Would recommend checking thyroid function studies.   We will also check SPEP UPEP and lambda kappa light chain ratio. 4.  Atrial fibrillation continues on amiodarone no anticoagulation secondary to history uterine bleeding    Sherril Croon 02/25/2020, 8:09 AM

## 2020-02-25 NOTE — Progress Notes (Signed)
Dr. Pricilla Handler at the bedside. Plan of care discussed and all questions and concerns answered at this time. Will continue to monitor.

## 2020-02-25 NOTE — TOC Initial Note (Signed)
Transition of Care [facility]) - Initial/Assessment Note    Patient Details  Name: Jackie Parker MRN: [id number] Date of Birth: 12/17/1940  [facility] CM/SW Contact:    Carles Collet, RN Phone Number: 02/25/2020, 4:16 PM  Clinical Narrative:         Patient admitted from home w CHF. Patient has a walker and a cane at home. Her daughter lives with her.  She has transportation at dc and to her MD apts. She has no issues with getting her medications. TOC team will cont to follow for dc needs PCP [place] Coverage [employer] Medicare and Medicaid       Expected Discharge Plan: [place] Barriers to Discharge: Continued Medical Work up   Patient Goals and CMS Choice        Expected Discharge Plan and Services Expected Discharge Plan: [place]   Discharge Planning Services: CM Consult   Living arrangements for the past 2 months: Single Family Home                                      Prior Living Arrangements/Services Living arrangements for the past 2 months: Single Family Home Lives with:: Adult Children                   Activities of Daily Living Home Assistive Devices/Equipment: None ADL Screening (condition at time of admission) Patient's cognitive ability adequate to safely complete daily activities?: Yes Is the patient deaf or have difficulty hearing?: No Does the patient have difficulty seeing, even when wearing glasses/contacts?: No Does the patient have difficulty concentrating, remembering, or making decisions?: No Patient able to express need for assistance with ADLs?: Yes Does the patient have difficulty dressing or bathing?: No Independently performs ADLs?: Yes (appropriate for developmental age) Does the patient have difficulty walking or climbing stairs?: Yes Weakness of Legs: Both Weakness of Arms/Hands: None  Permission Sought/Granted                  Emotional Assessment               Admission diagnosis:  Acute on chronic diastolic CHF (congestive heart failure) ([place]) [I50.33] Patient Active Problem List   Diagnosis Date Noted  . Acute on chronic diastolic CHF (congestive heart failure) ([place]) 02/24/2020  . CKD (chronic kidney disease) stage 3, GFR 30-59 ml/min 02/13/2020  . Acute on chronic diastolic (congestive) heart failure ([place]) 02/03/2020  . OSA (obstructive sleep apnea) 12/17/2019  . Atrial tachycardia ([place]) 10/02/2019  . Persistent atrial fibrillation ([place]) 08/15/2019  . Secondary hypercoagulable state ([place]) 08/15/2019  . Dyspnea on exertion 08/02/2019  . Hypomagnesemia 08/02/2019  . AF (paroxysmal atrial fibrillation) ([place]) 08/01/2019  . IUD check up 08/16/2017  . Asthma 01/30/2017  . Gynecologic exam normal 11/08/2016  . Urinary incontinence 11/08/2016  . Leukocytosis 06/29/2016  . Anemia 06/29/2016  . Acute renal failure superimposed on stage 4 chronic kidney disease ([place])   . Primary localized osteoarthritis of right knee 06/27/2016  . Osteoarthritis of left knee 12/14/2015    Class: End Stage  . Osteoarthritis of right knee 12/14/2015  . Acute renal failure ([place]) 08/30/2013  . 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 N&V (nausea and vomiting) 08/30/2013  . Hypokalemia 08/30/2013  . Postmenopausal bleeding 03/18/2013  . OSTEOARTHRITIS, KNEE, LEFT 03/20/2010  . TRICUSPID REGURGITATION, MILD 03/03/2010  .  Disorder resulting from impaired renal function 03/02/2010  . MORBID OBESITY 02/18/2010  . PREMATURE ATRIAL CONTRACTIONS 02/18/2010  . PREMATURE VENTRICULAR CONTRACTIONS 02/18/2010  . CARDIAC MURMUR 02/18/2010  . EDEMA 10/20/2009  . HYPOKALEMIA 07/30/2009  . RIB PAIN, RIGHT SIDED 05/26/2008  . ALLERGIC RHINITIS 06/25/2007  . ABNORMAL BLOOD CHEMISTRY , OTHER 06/25/2007  . UNSPECIFIED VITAMIN D DEFICIENCY 04/30/2007  . Hypocalcemia 04/19/2007  . GLAUCOMA NEC 04/19/2007  . Essential hypertension 04/19/2007  . Asthma 04/19/2007  .  Ulcerative colitis ([place]) 04/19/2007  . DISORDER, MENSTRUAL NEC 04/19/2007  . PSORIASIS 04/19/2007  . 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 GOUT NOS 02/21/2006   PCP:  [name], MD Pharmacy:   [facility] [place], [place] [address] Phone: [phone] Fax: [phone]     Social Determinants of Health (SDOH) Interventions    Readmission Risk Interventions No flowsheet data found.

## 2020-02-25 NOTE — Progress Notes (Signed)
Pt resting in bed. Colostomy change by RN with pt's supplies, located at the bedside. Pt stable. NO signs of distress noted. NO other needs or concerns at this time. Will continue to monitor.

## 2020-02-25 NOTE — Progress Notes (Signed)
Pt resting in bed. NO signs of distress noted. No urine put out since this AM. Total 154m left from night shift. [place], canister and tubing changed at 1045. Bladder scan complete at this time, 058mfound at this time.

## 2020-02-25 NOTE — H&P (Signed)
History and Physical    Jackie Parker JQZ:[id number] DOB: [birth year] DOA: 02/24/2020  PCP: Jackie Rakes, MD   Patient coming from: Home   Chief Complaint: Increased SOB and swelling   HPI: Jackie Parker is Parker 79 y.o. female with medical history significant for chronic diastolic CHF, atrial fibrillation not anticoagulated due to bleeding, chronic kidney disease stage IIIb, asthma, and ulcerative colitis status post remote colectomy, now presenting to the emergency department for evaluation of shortness of breath and swelling.  The patient was recently admitted to the hospital and discharged on 02/14/2020 after she was diuresed.  She underwent echocardiogram and right heart catheterization during the recent admission, put out 9 L of urine, and was in much improved condition when she went home.  Since returning home, patient has become progressively more short of breath despite adherence with her diuretics.  She has not noticed much change in her bilateral lower extremity swelling but reports that the right arm has become swollen which is new for her.  She denies chest pain, fevers, or chills, and reports that she has not been coughing much.  ED Course: Upon arrival to the ED, patient is found to be afebrile, saturating low 90s on room air, and with stable blood pressure.  EKG features sinus or ectopic atrial rhythm.  Chest x-ray with cardiomegaly, vascular congestion, and interstitial edema.  Chemistry panel is concerning for creatinine of 5.20, up from 3.9 at time of recent discharge.  CBC unremarkable.  BNP elevated to 325.  COVID-19 screening test was negative.  Cardiology was consulted by the ED physician and recommended medical admission.  Review of Systems:  All other systems reviewed and apart from HPI, are negative.  Past Medical History:  Diagnosis Date  . AKI (acute kidney injury) ([place]) 12/2015  . Allergy    takes Singulair and Zyrtec daily  . Asthma    uses Adair daily  .  Cataracts, bilateral   . CHF (congestive heart failure) ([place])   . Colitis, ulcerative ([place])   . DJD (degenerative joint disease)   . Glaucoma    uses eye drops daily  . History of blood transfusion    no abnormal reaction noted. 40+ yrs ago  . History of bronchitis    yrs ago  . History of gout    not on any meds  . Hypertension    takes Amlodipine daily  . Joint pain   . Joint swelling   . OSA (obstructive sleep apnea) 12/17/2019  . PMB (postmenopausal bleeding)   . Psoriasis   . Urinary frequency    takes Ditropan daily  . Vertigo    doesn't take any meds  . Vitamin D deficiency     Past Surgical History:  Procedure Laterality Date  . BACK SURGERY    . COLONOSCOPY    . DILATATION & CURETTAGE/HYSTEROSCOPY WITH MYOSURE N/Parker 08/05/2019   Procedure: DILATATION & CURETTAGE/HYSTEROSCOPY WITH MYOSURE;  Surgeon: Jackie Louis, MD;  Location: [facility];  Service: Gynecology;  Laterality: N/Parker;  Myosure rep will be here confirmed on 08/01/19 CS  . ILEOSTOMY    . KNEE ARTHROPLASTY Left 12/14/2015   Procedure: COMPUTER ASSISTED LEFT TOTAL KNEE ARTHROPLASTY;  Surgeon: Jessy Oto, MD;  Location: [facility];  Service: Orthopedics;  Laterality: Left;  . RIGHT HEART CATH N/Parker 02/11/2020   Procedure: RIGHT HEART CATH;  Surgeon: Martinique, Peter M, MD;  Location: [facility];  Service: Cardiovascular;  Laterality: N/Parker;  . TOTAL COLECTOMY  total colectomy in 1977  . TOTAL KNEE ARTHROPLASTY Left 12/14/2015  . TOTAL KNEE ARTHROPLASTY Right 06/27/2016  . TOTAL KNEE ARTHROPLASTY Right 06/27/2016   Procedure: RIGHT TOTAL KNEE ARTHROPLASTY;  Surgeon: Jessy Oto, MD;  Location: [facility];  Service: Orthopedics;  Laterality: Right;  . TUBAL LIGATION       reports that she has never smoked. She has never used smokeless tobacco. She reports that she does not drink alcohol and does not use drugs.  Allergies  Allergen Reactions  . Megace Es [Megestrol Acetate] Shortness Of Breath  . Sulfonamide Derivatives  Rash    Family History  Problem Relation Age of Onset  . Hypertension Sister   . Glaucoma Sister   . Endometrial cancer Neg Hx   . Ovarian cancer Neg Hx      Prior to Admission medications   Medication Sig Start Date End Date Taking? Authorizing Provider  albuterol (PROAIR HFA) 108 (90 Base) MCG/ACT inhaler Inhale 1 puff into the lungs every 6 (six) hours as needed for wheezing or shortness of breath.   Yes [provider]  albuterol (PROVENTIL) (2.5 MG/3ML) 0.083% nebulizer solution Take 2.5 mg by nebulization 2 (two) times daily as needed for wheezing or shortness of breath.    Yes [provider]  allopurinol (ZYLOPRIM) 100 MG tablet TAKE 1 TABLET(100 MG) BY MOUTH DAILY Patient taking differently: Take 100 mg by mouth as needed (gout).  04/01/19  Yes Jackie Rakes, MD  amiodarone (PACERONE) 200 MG tablet Take 1 tablet (200 mg total) by mouth daily. 11/21/19  Yes Fenton, Clint R, PA  brimonidine (ALPHAGAN) 0.2 % ophthalmic solution Place 1 drop into the right eye 3 (three) times daily. 06/18/19  Yes [provider]  brinzolamide (AZOPT) 1 % ophthalmic suspension Place 1 drop into the right eye 3 (three) times daily.    Yes [provider]  colchicine 0.6 MG tablet Take 2 tabs (1.2 mg) at the onset of Parker gout attack, may repeat 1 tab (0.6 mg) 2 hours later if symptoms persist. 10/22/18  Yes Jackie Rakes, MD  diltiazem (CARDIZEM CD) 360 MG 24 hr capsule Take 1 capsule (360 mg total) by mouth daily. 11/13/19 11/12/20 Yes Fenton, Clint R, PA  fluocinonide ointment (LIDEX) 2.95 % Apply 1 application topically 2 (two) times daily as needed for itching. 07/24/19  Yes [provider]  Fluticasone-Salmeterol (ADVAIR DISKUS) 500-50 MCG/DOSE AEPB Inhale 1 puff into the lungs in the morning and at bedtime. 11/27/19  Yes Olalere, Adewale A, MD  furosemide (LASIX) 40 MG tablet Take 1 tablet (40 mg total) by mouth 2 (two) times daily. 02/14/20  Yes Furth, Cadence H,  PA-C  LUMIGAN 0.01 % SOLN Place 1 drop into both eyes at bedtime. 05/27/16  Yes [provider]  medroxyPROGESTERone (PROVERA) 10 MG tablet Take 2 tablets (20 mg total) by mouth daily. 01/22/20  Yes Jackie Parker Amber, MD  potassium chloride (KLOR-CON) 10 MEQ tablet Take 4 tablets (40 mEq total) by mouth 2 (two) times daily for 5 days. Patient taking differently: Take 20 mEq by mouth 2 (two) times daily.  12/06/19 02/23/29 Yes Allred, Jeneen Rinks, MD  prochlorperazine (COMPAZINE) 10 MG tablet TAKE 1 TABLET(10 MG) BY MOUTH TWICE DAILY AS NEEDED FOR NAUSEA OR VOMITING Patient taking differently: Take 10 mg by mouth as needed for nausea or vomiting.  11/01/18  Yes Jackie Rakes, MD    Physical Exam: Vitals:   02/24/20 1829 02/24/20 1900 02/24/20 2305  BP: (!) 126/52 Marland Kitchen)  126/53 (!) 135/59  Pulse: 88 87 89  Resp: 12 (!) 0 (!) 25  Temp: 98.3 F (36.8 C)    TempSrc: Oral    SpO2: 93% 97% 92%    Constitutional: NAD, calm  Eyes: PERTLA, lids and conjunctivae normal ENMT: Mucous membranes are moist. Posterior pharynx clear of any exudate or lesions.   Neck: normal, supple, no masses, no thyromegaly Respiratory: Speaking full sentences, rales bilaterally. No accessory muscle use.  Cardiovascular: S1 & S2 heard, regular rate and rhythm. Pitting edema involving bilateral LEs and RUE. Abdomen: No distension, no tenderness, soft. Bowel sounds active.  Musculoskeletal: no clubbing / cyanosis. No joint deformity upper and lower extremities.   Skin: no significant rashes, lesions, ulcers. Warm, dry, well-perfused. Neurologic: No gross facial asymmetry. Sensation intact. Moving all extremities.  Psychiatric: Alert and oriented to person, place, and situation. Pleasant and cooperative.    Labs and Imaging on Admission: I have personally reviewed following labs and imaging studies  CBC: Recent Labs  Lab 02/24/20 1945 02/24/20 1956  WBC 9.8  --   NEUTROABS 6.0  --   HGB 11.2* 12.9  HCT 36.6 38.0  MCV  94.6  --   PLT 157  --    Basic Metabolic Panel: Recent Labs  Lab 02/24/20 1945 02/24/20 1956  NA 137 138  K 4.3 4.2  CL 103 106  CO2 22  --   GLUCOSE 98 99  BUN 46* 47*  CREATININE 5.20* 5.60*  CALCIUM 8.8*  --    GFR: Estimated Creatinine Clearance: 9.9 mL/min (Parker) (by C-G formula based on SCr of 5.6 mg/dL (Parker)). Liver Function Tests: Recent Labs  Lab 02/24/20 1945  AST 27  ALT 19  ALKPHOS 47  BILITOT 1.4*  PROT 8.1  ALBUMIN 3.1*   No results for input(s): LIPASE, AMYLASE in the last 168 hours. No results for input(s): AMMONIA in the last 168 hours. Coagulation Profile: No results for input(s): INR, PROTIME in the last 168 hours. Cardiac Enzymes: No results for input(s): CKTOTAL, CKMB, CKMBINDEX, TROPONINI in the last 168 hours. BNP (last 3 results) Recent Labs    12/02/19 1224  PROBNP 779*   HbA1C: No results for input(s): HGBA1C in the last 72 hours. CBG: No results for input(s): GLUCAP in the last 168 hours. Lipid Profile: No results for input(s): CHOL, HDL, LDLCALC, TRIG, CHOLHDL, LDLDIRECT in the last 72 hours. Thyroid Function Tests: No results for input(s): TSH, T4TOTAL, FREET4, T3FREE, THYROIDAB in the last 72 hours. Anemia Panel: No results for input(s): VITAMINB12, FOLATE, FERRITIN, TIBC, IRON, RETICCTPCT in the last 72 hours. Urine analysis:    Component Value Date/Time   COLORURINE AMBER (Parker) 02/24/2020 2315   APPEARANCEUR CLOUDY (Parker) 02/24/2020 2315   APPEARANCEUR Cloudy (Parker) 11/08/2016 1534   LABSPEC 1.017 02/24/2020 2315   PHURINE 5.0 02/24/2020 2315   GLUCOSEU NEGATIVE 02/24/2020 2315   HGBUR LARGE (Parker) 02/24/2020 2315   HGBUR negative 02/18/2010 0856   BILIRUBINUR NEGATIVE 02/24/2020 2315   BILIRUBINUR Negative 11/08/2016 1534   KETONESUR NEGATIVE 02/24/2020 2315   PROTEINUR >=300 (Parker) 02/24/2020 2315   UROBILINOGEN 1.0 08/30/2013 0901   NITRITE NEGATIVE 02/24/2020 2315   LEUKOCYTESUR LARGE (Parker) 02/24/2020 2315   Sepsis  Labs: @LABRCNTIP (procalcitonin:4,lacticidven:4) ) Recent Results (from the past 240 hour(s))  SARS CORONAVIRUS 2 (TAT 6-24 HRS) Nasopharyngeal Nasopharyngeal Swab     Status: None   Collection Time: 02/22/20 12:02 PM   Specimen: Nasopharyngeal Swab  Result Value Ref Range Status  SARS Coronavirus 2 NEGATIVE NEGATIVE Final    Comment: (NOTE) SARS-CoV-2 target nucleic acids are NOT DETECTED.  The SARS-CoV-2 RNA is generally detectable in upper and lower respiratory specimens during the acute phase of infection. Negative results do not preclude SARS-CoV-2 infection, do not rule out co-infections with other pathogens, and should not be used as the sole basis for treatment or other patient management decisions. Negative results must be combined with clinical observations, patient history, and epidemiological information. The expected result is Negative.  Fact Sheet for Patients: SugarRoll.be  Fact Sheet for Healthcare Providers: https://www.woods-mathews.com/  This test is not yet approved or cleared by the [place] FDA and  has been authorized for detection and/or diagnosis of SARS-CoV-2 by FDA under an Emergency Use Authorization (EUA). This EUA will remain  in effect (meaning this test can be used) for the duration of the COVID-19 declaration under Se ction 564(b)(1) of the Act, 21 U.S.C. section 360bbb-3(b)(1), unless the authorization is terminated or revoked sooner.  Performed at [facility], [place] [address], [place], [place] [address]      Radiological Exams on Admission: DG Chest Port 1 View  Result Date: 02/24/2020 CLINICAL DATA:  Shortness of breath EXAM: PORTABLE CHEST 1 VIEW COMPARISON:  02/04/2020 FINDINGS: Mild cardiomegaly with vascular congestion. Mild diffuse interstitial and ground-glass opacity. Suspected small left effusion. No pneumothorax. IMPRESSION: 1. Cardiomegaly with vascular congestion and mild  diffuse interstitial and ground-glass opacities suggestive of edema 2. Suspected small left effusion Electronically Signed   By: Donavan Foil Parker.D.   On: 02/24/2020 19:19    EKG: Independently reviewed. Sinus or ectopic atrial rhythm, PVCs.   Assessment/Plan   1. Acute on chronic diastolic CHF  - Presents with worsening SOB and swelling and is found to have marked peripheral edema, diffuse interstitial edema on CXR, BNP higher than priors, and significant worsening in renal function  - Cardiology is consulting and much appreciated  - Diurese with Lasix 40 mg IV q12h, follow daily wt and I/Os, and monitor renal function and electrolytes    2. Acute kidney injury superimposed on CKD IIIb-IV  - SCr is 5.20 on admission, up from 3.94 at time of recent discharge  - Potassium and bicarbonate are normal but she is hypervolemic  - Renally-dose medications, check FEUrea, follow closely while diuresing    3. Atrial fibrillation  - In sinus rhythm on admission  - CHADS-VASc is 23 (age x2, gender, CHF, HTN) but she is not anticoagulated due to hx of uterine bleeding  - Continue amiodarone and diltiazem   4. Asthma  - No recent cough or wheeze  - Continue ICS/LABA and as-needed albuterol   5. Right arm swelling  - Patient reports new development of RUE swelling without trauma or history of breast surgery or radiation  - No evidence for infection, check venous US for DVT and diurese as above    DVT prophylaxis: sq heparin  Code Status: Full  Family Communication: Discussed with patient  Disposition Plan:  Patient is from: Home  Anticipated d/c is to: TBD Anticipated d/c date is: 02/29/20 Patient currently: has marked hypervolemia with pulmonary edema complicated by AKI on top of advanced CKD   Consults called: Cardiology  Admission status: Inpatient     Vianne Bulls, MD [facility] Pager: See [email or web address]  If 7AM-7PM, please contact the daytime  attending [email or web address]  02/25/2020, 12:05 AM

## 2020-02-25 NOTE — Progress Notes (Signed)
Dr. Web and Dr. [name] were notified of low urine output and negative bladder scan.

## 2020-02-25 NOTE — Progress Notes (Signed)
Progress Note  Patient Name: Jackie Parker Date of Encounter: 02/25/2020  [facility] Cardiologist: Sinclair Grooms, MD   Subjective   Breathing same. Renal function worsen. No chest pain. Any movement makes breathing worse.   Inpatient Medications    Scheduled Meds: . amiodarone  200 mg Oral Daily  . brimonidine  1 drop Right Eye TID  . brinzolamide  1 drop Right Eye TID  . diltiazem  360 mg Oral Daily  . furosemide  40 mg Intravenous Q12H  . heparin  5,000 Units Subcutaneous Q8H  . latanoprost  1 drop Both Eyes QHS  . mometasone-formoterol  2 puff Inhalation BID  . sodium chloride flush  3 mL Intravenous Q12H   Continuous Infusions: . sodium chloride     PRN Meds: sodium chloride, acetaminophen, albuterol, ondansetron (ZOFRAN) IV, sodium chloride flush   Vital Signs    Vitals:   02/25/20 0021 02/25/20 0114 02/25/20 0146 02/25/20 0413  BP:  (!) 92/54 (!) 109/53 (!) 91/50  Pulse: 85 86 82 83  Resp: (!) 27 16 20 20   Temp:  98.5 F (36.9 C) 97.8 F (36.6 C) 98.2 F (36.8 C)  TempSrc:  Oral  Oral  SpO2: 93% 94% 93% 93%  Weight:   114.6 kg   Height:   5' 2"  (1.575 m)     Intake/Output Summary (Last 24 hours) at 02/25/2020 0820 Last data filed at 02/25/2020 0700 Gross per 24 hour  Intake 240 ml  Output 25 ml  Net 215 ml   Last 3 Weights 02/25/2020 02/14/2020 02/13/2020  Weight (lbs) 252 lb 10.4 oz 251 lb 3.2 oz 251 lb 3.2 oz  Weight (kg) 114.6 kg 113.944 kg 113.944 kg      Telemetry    NSR - Personally Reviewed  ECG    No new tracing   Physical Exam   GEN: No acute distress.   Neck: No JVD Cardiac: RRR, 2/6 systolic murmurs, rubs, or gallops.  Respiratory: Diminished breath sound throughout  GI: Soft, nontender, non-distended  MS: 1 + BL LE  Edema with dark sink changes; No deformity. Neuro:  Nonfocal  Psych: Normal affect   Labs     Chemistry Recent Labs  Lab 02/24/20 1945 02/24/20 1956  NA 137 138  K 4.3 4.2  CL 103 106  CO2 22   --   GLUCOSE 98 99  BUN 46* 47*  CREATININE 5.20* 5.60*  CALCIUM 8.8*  --   PROT 8.1  --   ALBUMIN 3.1*  --   AST 27  --   ALT 19  --   ALKPHOS 47  --   BILITOT 1.4*  --   GFRNONAA 7*  --   GFRAA 9*  --   ANIONGAP 12  --      Hematology Recent Labs  Lab 02/24/20 1945 02/24/20 1956  WBC 9.8  --   RBC 3.87  --   HGB 11.2* 12.9  HCT 36.6 38.0  MCV 94.6  --   MCH 28.9  --   MCHC 30.6  --   RDW 16.8*  --   PLT 157  --     BNP Recent Labs  Lab 02/24/20 1945  BNP 324.8*     DDimer No results for input(s): DDIMER in the last 168 hours.   Radiology    DG Chest Port 1 View  Result Date: 02/24/2020 CLINICAL DATA:  Shortness of breath EXAM: PORTABLE CHEST 1 VIEW COMPARISON:  02/04/2020 FINDINGS: Mild  cardiomegaly with vascular congestion. Mild diffuse interstitial and ground-glass opacity. Suspected small left effusion. No pneumothorax. IMPRESSION: 1. Cardiomegaly with vascular congestion and mild diffuse interstitial and ground-glass opacities suggestive of edema 2. Suspected small left effusion Electronically Signed   By: Donavan Foil M.D.   On: 02/24/2020 19:19    Cardiac Studies   RIGHT HEART CATH 02/11/20 1. Moderate pulmonary venous hypertension. Mean PAP 40 mm Hg 2. Elevated LV filling pressures. Mean PCWP 30 mm Hg with prominent V wave to 54 mm Hg 3. Good cardiac output. Index 4.95.  Fick Cardiac Output 9.12 L/min  Fick Cardiac Output Index 4.35 (L/min)/BSA  RA A Wave -99 mmHg  RA V Wave 28 mmHg  RA Mean 21 mmHg  RV Systolic Pressure 69 mmHg  RV Diastolic Pressure 6 mmHg  RV EDP 22 mmHg  PA Systolic Pressure 70 mmHg  PA Diastolic Pressure 26 mmHg  PA Mean 41 mmHg  PW A Wave -99 mmHg  PW V Wave 54 mmHg  PW Mean 30 mmHg  QP/QS 1  TPVR Index 9.2 HRUI   Echo 02/05/20 1. Left ventricular ejection fraction, by estimation, is 60 to 65%. The  left ventricle has normal function. The left ventricle has no regional  wall motion abnormalities. Left  ventricular diastolic function could not  be evaluated.  2. Right ventricular systolic function is normal. The right ventricular  size is normal. There is mildly elevated pulmonary artery systolic  pressure. The estimated right ventricular systolic pressure is 16.1 mmHg.  3. The mitral valve is degenerative. Trivial mitral valve regurgitation.  No evidence of mitral stenosis.  4. The aortic valve is tricuspid. Aortic valve regurgitation is not  visualized. Mild aortic valve stenosis. Aortic valve area, by VTI measures  1.61 cm. Aortic valve mean gradient measures 12.6 mmHg. Aortic valve Vmax  measures 2.53 m/s.  5. The inferior vena cava is dilated in size with >50% respiratory  variability, suggesting right atrial pressure of 8 mmHg.   Comparison(s): No significant change from prior study. EF ~60%. Mild to  moderate RVSP 45 mmHG on this study.  \ Patient Profile     79 y.o. female with a past medical history significant for diastolic heart failure, persistent atrial fibrillation (on amiodarone and no anticoagulation due to uterine bleeding), chronic kidney disease, obesity (BMI 45), HTN, ulcerative colitis (s/p colectomy 1977), OA s/p TKR, chronic hypokalemia, psoriasis, and asthma, who presents with shortness of breath.   Recent long admission 5/24-6/4 with acute dCHF. RHC showed moderate pulmonary venous HTN, mean PAP 40 mmHg, elevated filling pressures, mean PCWP 30 mmHg with prominent V wave to 54 mmHg, good cardiac output. Diuresed 9L. Discharge weight 113.9kg. Discharge lasix dose was 78m BID.   Assessment & Plan    1. Acute on chronic diastolic CHF - Seem multifactorial issue. Recent RHC as above. BNP 324. CXR showed cardiomegaly with vascular congestion and mild diffuse interstitial and ground glass opacities suggestive of edema.  - Started diuresis with IV lasix 460mBID>> I & O hasn't recorded accurately. Renal function worsen.   2. Acute on chronic CKD IV-V - SCr  5.2>>5.6 (discharge Scr was 3.94 on 6/3) - nephology on board   3. Persistent atrial fibrillation - Seems ectopic rhythm on tele. Rate controlled. Not on anticoagulation due to hx of uterine bleeding.  - Continue Amiodarone 20056md and cardizem 360m69m     For questions or updates, please contact [email or web address] consult [email or web address] for contact info under  Jarrett Soho, PA  02/25/2020, 8:20 AM

## 2020-02-26 ENCOUNTER — Other Ambulatory Visit (HOSPITAL_COMMUNITY): Payer: Medicare Other

## 2020-02-26 ENCOUNTER — Inpatient Hospital Stay (HOSPITAL_COMMUNITY): Payer: Medicare Other

## 2020-02-26 ENCOUNTER — Ambulatory Visit: Payer: Medicare Other | Admitting: Pulmonary Disease

## 2020-02-26 DIAGNOSIS — M7989 Other specified soft tissue disorders: Secondary | ICD-10-CM

## 2020-02-26 LAB — KAPPA/LAMBDA LIGHT CHAINS
Kappa free light chain: 229.6 mg/L — ABNORMAL HIGH (ref 3.3–19.4)
Kappa, lambda light chain ratio: 1.69 — ABNORMAL HIGH (ref 0.26–1.65)
Lambda free light chains: 135.9 mg/L — ABNORMAL HIGH (ref 5.7–26.3)

## 2020-02-26 LAB — BASIC METABOLIC PANEL
Anion gap: 12 (ref 5–15)
BUN: 53 mg/dL — ABNORMAL HIGH (ref 8–23)
CO2: 22 mmol/L (ref 22–32)
Calcium: 8.5 mg/dL — ABNORMAL LOW (ref 8.9–10.3)
Chloride: 103 mmol/L (ref 98–111)
Creatinine, Ser: 5.73 mg/dL — ABNORMAL HIGH (ref 0.44–1.00)
GFR calc Af Amer: 8 mL/min — ABNORMAL LOW (ref >60)
GFR calc non Af Amer: 7 mL/min — ABNORMAL LOW (ref >60)
Glucose, Bld: 91 mg/dL (ref 70–99)
Potassium: 4.6 mmol/L (ref 3.5–5.1)
Sodium: 137 mmol/L (ref 135–145)

## 2020-02-26 LAB — PROTEIN ELECTROPHORESIS, SERUM
A/G Ratio: 0.7 (ref 0.7–1.7)
Albumin ELP: 3 g/dL (ref 2.9–4.4)
Alpha-1-Globulin: 0.2 g/dL (ref 0.0–0.4)
Alpha-2-Globulin: 0.6 g/dL (ref 0.4–1.0)
Beta Globulin: 1.3 g/dL (ref 0.7–1.3)
Gamma Globulin: 2.4 g/dL — ABNORMAL HIGH (ref 0.4–1.8)
Globulin, Total: 4.6 g/dL — ABNORMAL HIGH (ref 2.2–3.9)
Total Protein ELP: 7.6 g/dL (ref 6.0–8.5)

## 2020-02-26 LAB — C4 COMPLEMENT: Complement C4, Body Fluid: 13 mg/dL (ref 12–38)

## 2020-02-26 LAB — MPO/PR-3 (ANCA) ANTIBODIES
ANCA Proteinase 3: <3.5 U/mL (ref 0.0–3.5)
Myeloperoxidase Abs: <9 U/mL (ref 0.0–9.0)

## 2020-02-26 LAB — UREA NITROGEN, URINE: Urea Nitrogen, Ur: 363 mg/dL

## 2020-02-26 LAB — ANTINUCLEAR ANTIBODIES, IFA: ANA Ab, IFA: NEGATIVE

## 2020-02-26 LAB — GLOMERULAR BASEMENT MEMBRANE ANTIBODIES: GBM Ab: 4 units (ref 0–20)

## 2020-02-26 LAB — C3 COMPLEMENT: C3 Complement: 66 mg/dL — ABNORMAL LOW (ref 82–167)

## 2020-02-26 LAB — HEPATITIS C VRS RNA DETECT BY PCR-QUAL: Hepatitis C Vrs RNA by PCR-Qual: POSITIVE — AB

## 2020-02-26 MED ORDER — BRIMONIDINE TARTRATE 0.2 % OP SOLN
1.0000 [drp] | Freq: Three times a day (TID) | OPHTHALMIC | Status: DC
  Administered 2020-02-26 – 2020-03-05 (×21): 1 [drp] via OPHTHALMIC
  Filled 2020-02-26: qty 5

## 2020-02-26 MED ORDER — TRAMADOL HCL 50 MG PO TABS
50.0000 mg | ORAL_TABLET | Freq: Four times a day (QID) | ORAL | Status: DC | PRN
  Administered 2020-02-26 – 2020-03-02 (×3): 50 mg via ORAL
  Filled 2020-02-26 (×3): qty 1

## 2020-02-26 MED ORDER — METOLAZONE 2.5 MG PO TABS
2.5000 mg | ORAL_TABLET | Freq: Every day | ORAL | Status: DC
  Administered 2020-02-26 – 2020-03-04 (×8): 2.5 mg via ORAL
  Filled 2020-02-26 (×8): qty 1

## 2020-02-26 MED ORDER — FUROSEMIDE 10 MG/ML IJ SOLN
80.0000 mg | Freq: Four times a day (QID) | INTRAMUSCULAR | Status: DC
  Administered 2020-02-26 – 2020-03-05 (×31): 80 mg via INTRAVENOUS
  Filled 2020-02-26 (×32): qty 8

## 2020-02-26 NOTE — Progress Notes (Signed)
PT Cancellation Note  Patient Details Name: Jackie Parker MRN: [id number] DOB: October 27, 1940   Cancelled Treatment:    Reason Eval/Treat Not Completed: Patient at procedure or test/unavailable. Pt having bedside US performed. PT will continue to f/u with pt acutely as available.    Clearnce Sorrel Myli Pae 02/26/2020, 4:38 PM

## 2020-02-26 NOTE — Progress Notes (Signed)
Right upper extremity venous duplex completed. Refer to "CV Proc" under chart review to view preliminary results.  02/26/2020 2:48 PM Kelby Aline., MHA, RVT, RDCS, RDMS

## 2020-02-26 NOTE — Progress Notes (Signed)
Pt only had 50cc urine output since 1900 last night, Dr. Myna Hidalgo notified, awaiting call back.

## 2020-02-26 NOTE — Progress Notes (Signed)
  Mobility Specialist Criteria Algorithm Info. Mobility Team:  [facility] elevated:Self regulated Activity: Ambulated in hall;Transferred:  Bed to chair (to chair after ambulation) Range of motion: Active;All extremities Level of assistance: Minimal assist, patient does 75% or more (Standby for ambulation) Assistive device: Counsellor Minutes sitting in chair:  Minutes stood: 4 minutes Minutes ambulated: 4 minutes Distance ambulated (ft): 30 ft Mobility response: Tolerated well (Per pt, has chronic SOB that increases  w/fatigue) Bed Position: Chair (Chair)  Pt tolerated mobility well, eager and willing to get up and walk. Prior to ambulation was dangling at the EOB c/o LE weakness and pain that's caused her to be non-ambulatory x5 days. Patient was able to stand with minimal assist and walk with a steady gait for 29f before needing one standing rest break. Once back in her room and seated in chair, pt had no further complaints regarding pain in her LE's.  HR Pre:82 BP Pre: 107/63 HR Post: 88 BP Post: 104/60    02/26/2020 10:44 AM

## 2020-02-26 NOTE — Progress Notes (Addendum)
PROGRESS NOTE    Kiyonna Tortorelli  WKG:[id number] DOB: Dec 07, 1940 DOA: 02/24/2020 PCP: [name], MD    Brief Narrative: 79 year old female with history of atrial fibrillation not on anticoagulation due to uterine bleeding, stage III CKD stage IIIb, chronic diastolic CHF admitted with acute on chronic CHF exacerbation.  Patient was admitted to the hospital 02/14/2020 with the same she underwent right heart catheterization and echocardiogram at that time.  She was adherent to her diuretics at home in spite of it patient developed shortness of breath and swelling.  She also noticed new right arm swelling. She is admitted for acute on chronic diastolic CHF and AKI on CKD stage IIIb.  Assessment & Plan:   Principal Problem:   Acute on chronic diastolic CHF (congestive heart failure) (HCC) Active Problems:   Acute renal failure superimposed on stage 4 chronic kidney disease (HCC)   AF (paroxysmal atrial fibrillation) (HCC)    #1 recurrent acute on chronic diastolic CHF-followed by cardiology and nephrology. Lasix dose increased to 3 times a day since urine output has not improved. Echo with normal systolic function. Cardiac cath earlier this month moderate pulmonary hypertension PT consult #2 AKI on CKD stage IIIb to stage IV-her baseline creatinine is around 2.   At the time of admission her creatinine was 5.2.  Creatinine up to 5.73 today. At the time of discharge from the hospital after cardiac cath earlier this month her creatinine was 3.94. Renal ultrasound today. Normal TSH.  #3 paroxysmal atrial fibrillation on amiodarone and diltiazem.  Not on anticoagulation due to fibroid bleeding  #4 abnormal UA since patient not symptomatic not started on antibiotics.  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   Weight as of this encounter: 116.2 kg.  DVT prophylaxis: Subcu heparin  code Status: Full code Family Communication: None at bedside patient reports she lives at home with her daughter  disposition Plan:  Status is: Inpatient  Dispo:   Patient From: Home  Planned Disposition: To be determined  Expected discharge date: 03/02/20  Medically stable for discharge: No patient with fluid overload and worsening renal function on IV diuretics    Consultants:   Cardiology and nephrology  Procedures: None Antimicrobials none  Subjective: Resting in bed she is laying flat She feels her breathing is somewhat better but not yet back to her baseline She reports she does not walk a lot in the house  Objective: Vitals:   02/26/20 0002 02/26/20 0336 02/26/20 0845 02/26/20 0850  BP: 118/63 (!) 116/52 104/60   Pulse: 88 81 81   Resp: 17 19    Temp: 98.1 F (36.7 C) 98.5 F (36.9 C) 97.6 F (36.4 C)   TempSrc:  Oral Oral   SpO2: 91% 91% 96% 94%  Weight:  116.2 kg    Height:        Intake/Output Summary (Last 24 hours) at 02/26/2020 0951 Last data filed at 02/26/2020 4585 Gross per 24 hour  Intake 420 ml  Output 50 ml  Net 370 ml   Filed Weights   02/25/20 0146 02/26/20 0336  Weight: 114.6 kg 116.2 kg    Examination:  General exam: Appears calm and comfortable  Respiratory system: Diminished at the bases to auscultation. Respiratory effort normal. Cardiovascular system: S1 & S2 heard, RRR. No JVD, murmurs, rubs, gallops or clicks. No pedal edema. Gastrointestinal system: Abdomen is nondistended, soft and nontender. No organomegaly or masses felt. Normal bowel sounds heard.  Colostomy with liquid stool Central nervous system: Alert and oriented. No focal neurological deficits. Extremities 2+ edema with chronic venous stasis  changes. Skin: No rashes, lesions or ulcers Psychiatry: Judgement and insight appear normal. Mood & affect appropriate.     Data Reviewed: I have personally reviewed following labs and imaging studies  CBC: Recent Labs  Lab 02/24/20 1945 02/24/20 1956  WBC 9.8  --   NEUTROABS 6.0  --   HGB 11.2* 12.9  HCT 36.6 38.0  MCV 94.6  --   PLT 157  --    Basic Metabolic Panel: Recent Labs  Lab 02/24/20 1945 02/24/20 1956 02/26/20 0411  NA 137 138 137  K 4.3 4.2 4.6  CL 103 106 103  CO2 22  --  22  GLUCOSE 98 99 91  BUN 46* 47* 53*  CREATININE 5.20* 5.60* 5.73*  CALCIUM 8.8*  --  8.5*   GFR: Estimated Creatinine Clearance: 9.8 mL/min (A) (by C-G formula based on SCr of 5.73 mg/dL (H)). Liver Function Tests: Recent Labs  Lab 02/24/20 1945  AST 27  ALT 19  ALKPHOS 47  BILITOT 1.4*  PROT 8.1  ALBUMIN 3.1*   No results for input(s): LIPASE, AMYLASE in the last 168 hours. No results for input(s): AMMONIA in the last 168 hours. Coagulation Profile: No results for input(s): INR, PROTIME in the last 168 hours. Cardiac Enzymes: No results for input(s): CKTOTAL, CKMB, CKMBINDEX, TROPONINI in the last 168 hours. BNP (last 3 results) Recent Labs    12/02/19 1224  PROBNP 779*   HbA1C: No results for input(s): HGBA1C in the last 72 hours. CBG: No results for input(s): GLUCAP in the last 168 hours. Lipid Profile: No results for input(s): CHOL, HDL, LDLCALC, TRIG, CHOLHDL, LDLDIRECT in the last 72 hours. Thyroid Function Tests: Recent Labs    02/25/20 0944  TSH 2.290   Anemia Panel: No results for input(s): VITAMINB12, FOLATE, FERRITIN, TIBC, IRON, RETICCTPCT in the last 72 hours. Sepsis Labs: No results for input(s): PROCALCITON, LATICACIDVEN in the last 168 hours.  Recent Results (from the past 240 hour(s))  SARS CORONAVIRUS 2 (TAT 6-24 HRS) Nasopharyngeal Nasopharyngeal Swab     Status: None   Collection Time: 02/22/20 12:02 PM   Specimen: Nasopharyngeal Swab    Result Value Ref Range Status   SARS Coronavirus 2 NEGATIVE NEGATIVE Final    Comment: (NOTE) SARS-CoV-2 target nucleic acids are NOT DETECTED.  The SARS-CoV-2 RNA is generally detectable in upper and lower respiratory specimens during the acute phase of infection. Negative results do not preclude SARS-CoV-2 infection, do not rule out co-infections with other pathogens, and should not be used as the sole basis for treatment or other patient management decisions. Negative results must be combined with clinical observations, patient history, and epidemiological information. The expected result is Negative.  Fact Sheet for Patients: SugarRoll.be  Fact Sheet for Healthcare Providers: https://www.woods-.com/  This test is not yet approved or cleared by the [place] FDA and  has been authorized for detection and/or diagnosis of SARS-CoV-2 by FDA under an Emergency Use Authorization (EUA). This EUA will remain  in effect (meaning this test can be used) for the duration of the COVID-19 declaration under Se ction 564(b)(1)  of the Act, 21 U.S.C. section 360bbb-3(b)(1), unless the authorization is terminated or revoked sooner.  Performed at [facility], [place] [address], [place], [place] [address]   SARS Coronavirus 2 by RT PCR (hospital order, performed in [facility] hospital lab) Nasopharyngeal Nasopharyngeal Swab     Status: None   Collection Time: 02/24/20 10:55 PM   Specimen: Nasopharyngeal Swab  Result Value Ref Range Status   SARS Coronavirus 2 NEGATIVE NEGATIVE Final    Comment: (NOTE) SARS-CoV-2 target nucleic acids are NOT DETECTED.  The SARS-CoV-2 RNA is generally detectable in upper and lower respiratory specimens during the acute phase of infection. The lowest concentration of SARS-CoV-2 viral copies this assay can detect is 250 copies / mL. A negative result does not preclude SARS-CoV-2 infection and should not  be used as the sole basis for treatment or other patient management decisions.  A negative result may occur with improper specimen collection / handling, submission of specimen other than nasopharyngeal swab, presence of viral mutation(s) within the areas targeted by this assay, and inadequate number of viral copies (<250 copies / mL). A negative result must be combined with clinical observations, patient history, and epidemiological information.  Fact Sheet for Patients:   StrictlyIdeas.no  Fact Sheet for Healthcare Providers: BankingDealers.co.za  This test is not yet approved or  cleared by the [place] FDA and has been authorized for detection and/or diagnosis of SARS-CoV-2 by FDA under an Emergency Use Authorization (EUA).  This EUA will remain in effect (meaning this test can be used) for the duration of the COVID-19 declaration under Section 564(b)(1) of the Act, 21 U.S.C. section 360bbb-3(b)(1), unless the authorization is terminated or revoked sooner.  Performed at [facility], [place] [address] [place], [place] [address]          Radiology Studies: DG Chest Port 1 View  Result Date: 02/24/2020 CLINICAL DATA:  Shortness of breath EXAM: PORTABLE CHEST 1 VIEW COMPARISON:  02/04/2020 FINDINGS: Mild cardiomegaly with vascular congestion. Mild diffuse interstitial and ground-glass opacity. Suspected small left effusion. No pneumothorax. IMPRESSION: 1. Cardiomegaly with vascular congestion and mild diffuse interstitial and ground-glass opacities suggestive of edema 2. Suspected small left effusion Electronically Signed   By: Donavan Foil M.D.   On: 02/24/2020 19:19        Scheduled Meds: . amiodarone  200 mg Oral Daily  . brimonidine  1 drop Right Eye TID  . brinzolamide  1 drop Right Eye TID  . diltiazem  360 mg Oral Daily  . furosemide  80 mg Intravenous Q6H  . heparin  5,000 Units Subcutaneous Q8H  .  latanoprost  1 drop Both Eyes QHS  . medroxyPROGESTERone  20 mg Oral Daily  . mometasone-formoterol  2 puff Inhalation BID  . sodium chloride flush  3 mL Intravenous Q12H   Continuous Infusions: . sodium chloride       LOS: 2 days      Georgette Shell, MD  02/26/2020, 9:51 AM

## 2020-02-26 NOTE — Consult Note (Signed)
   [facility] Inpatient Consult   02/26/2020  Jackie Parker [birth year] [id number]   [facility] [ACO] Patient: [name]   Patient screened for high risk score for unplanned readmission less than 30 days readmission hospitalization.   Review of patient's medical record reveals patient is being evaluated by a palliative care consult.  Primary Care Provider is [name], MD with [facility] and this provider is listed to provide the transition of care [TOC] for post hospital follow up.  Plan:  Continue to follow progress and disposition to assess for post hospital care management needs as disposition is currently undetermined.    Please place a [facility] Care Management consult as appropriate and for questions contact:   Natividad Brood, RN BSN [facility] Liaison  [phone] business mobile phone Toll free office [phone]  Fax number: [phone] [email or web address]

## 2020-02-26 NOTE — Progress Notes (Signed)
[facility] ROUNDING NOTE   Subjective:   79 year old lady diastolic heart failure atrial fibrillation on amiodarone no anticoagulation secondary to uterine bleeding chronic kidney disease baseline serum creatinine 1.5 to 2 mg/dL as of November 2020.  Recent hospitalization for volume overload 02/03/2020 discharged for 02/14/2020.  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  Evaluation by nephrology believe this was cardiorenal syndrome no identified nephrotoxins.  Discharge dose of Lasix 40 mg twice daily presented with  shortness of breath and worsening renal function.  Blood pressure 116/52 pulse 79 temperature 98.5 O2 sats 91% room air  Sodium 137 potassium 4.6 chloride 103 CO2 22 BUN 53 creatinine 5.73 glucose 91 calcium 8.5 hemoglobin 12.9  Minimal urine output 50 cc noted 02/25/2019  Amiodarone 200 mg daily diltiazem 360 mg daily Lasix 40 mg every 12 hours Provera 20 mg daily   Objective:  Vital signs in last 24 hours:  Temp:  [98.1 F (36.7 C)-98.5 F (36.9 C)] 98.5 F (36.9 C) (06/16 0336) Pulse Rate:  [81-88] 81 (06/16 0336) Resp:  [16-19] 19 (06/16 0336) BP: (101-118)/(46-79) 116/52 (06/16 0336) SpO2:  [90 %-94 %] 91 % (06/16 0336) Weight:  [116.2 kg] 116.2 kg (06/16 0336)  Weight change: 1.6 kg Filed Weights   02/25/20 0146 02/26/20 0336  Weight: 114.6 kg 116.2 kg    Intake/Output: I/O last 3 completed shifts: In: 480 [P.O.:480] Out: 75 [Urine:50; Stool:25]   Intake/Output this shift:  No intake/output data recorded.  General: Weak no acute distress HEENT: Normal Eyes: Pupils round equal reactive no icterus or pallor Neck: Supple slightly elevated JVP no lymphadenopathy Heart: Regular rate and rhythm faint systolic murmur Lungs: Decreased air entry with occasional wheezes rales Abdomen: Soft nontender bowel sounds  present no hepatosplenomegaly Extremities: No cyanosis clubbing 1+ pitting edema bilateral Skin: Warm well perfused Neuro: Grossly intact   Basic Metabolic Panel: Recent Labs  Lab 02/24/20 1945 02/24/20 1956 02/26/20 0411  NA 137 138 137  K 4.3 4.2 4.6  CL 103 106 103  CO2 22  --  22  GLUCOSE 98 99 91  BUN 46* 47* 53*  CREATININE 5.20* 5.60* 5.73*  CALCIUM 8.8*  --  8.5*    Liver Function Tests: Recent Labs  Lab 02/24/20 1945  AST 27  ALT 19  ALKPHOS 47  BILITOT 1.4*  PROT 8.1  ALBUMIN 3.1*   No results for input(s): LIPASE, AMYLASE in the last 168 hours. No results for input(s): AMMONIA in the last 168 hours.  CBC: Recent Labs  Lab 02/24/20 1945 02/24/20 1956  WBC 9.8  --   NEUTROABS 6.0  --   HGB 11.2* 12.9  HCT 36.6 38.0  MCV 94.6  --   PLT 157  --     Cardiac Enzymes: No results for input(s): CKTOTAL, CKMB, CKMBINDEX, TROPONINI in the last 168 hours.  BNP: Invalid input(s): POCBNP  CBG: No results for input(s): GLUCAP in the last 168 hours.  Microbiology: Results for orders placed or performed during the hospital encounter of 02/24/20  SARS Coronavirus 2 by RT PCR (hospital order, performed in [facility] hospital lab) Nasopharyngeal Nasopharyngeal Swab     Status: None   Collection Time: 02/24/20 10:55 PM   Specimen: Nasopharyngeal Swab  Result Value Ref Range Status   SARS Coronavirus 2 NEGATIVE NEGATIVE Final    Comment: (NOTE) SARS-CoV-2 target nucleic acids are NOT DETECTED.  The SARS-CoV-2 RNA is  generally detectable in upper and lower respiratory specimens during the acute phase of infection. The lowest concentration of SARS-CoV-2 viral copies this assay can detect is 250 copies / mL. A negative result does not preclude SARS-CoV-2 infection and should not be used as the sole basis for treatment or other patient management decisions.  A negative result may occur with improper specimen collection / handling, submission of specimen  other than nasopharyngeal swab, presence of viral mutation(s) within the areas targeted by this assay, and inadequate number of viral copies (<250 copies / mL). A negative result must be combined with clinical observations, patient history, and epidemiological information.  Fact Sheet for Patients:   StrictlyIdeas.no  Fact Sheet for Healthcare Providers: BankingDealers.co.za  This test is not yet approved or  cleared by the [place] FDA and has been authorized for detection and/or diagnosis of SARS-CoV-2 by FDA under an Emergency Use Authorization (EUA).  This EUA will remain in effect (meaning this test can be used) for the duration of the COVID-19 declaration under Section 564(b)(1) of the Act, 21 U.S.C. section 360bbb-3(b)(1), unless the authorization is terminated or revoked sooner.  Performed at [facility], [place] [address], [place], [place] [address]     Coagulation Studies: No results for input(s): LABPROT, INR in the last 72 hours.  Urinalysis: Recent Labs    02/24/20 2315  COLORURINE AMBER*  LABSPEC 1.017  PHURINE 5.0  GLUCOSEU NEGATIVE  HGBUR LARGE*  BILIRUBINUR NEGATIVE  KETONESUR NEGATIVE  PROTEINUR >=300*  NITRITE NEGATIVE  LEUKOCYTESUR LARGE*      Imaging: DG Chest Port 1 View  Result Date: 02/24/2020 CLINICAL DATA:  Shortness of breath EXAM: PORTABLE CHEST 1 VIEW COMPARISON:  02/04/2020 FINDINGS: Mild cardiomegaly with vascular congestion. Mild diffuse interstitial and ground-glass opacity. Suspected small left effusion. No pneumothorax. IMPRESSION: 1. Cardiomegaly with vascular congestion and mild diffuse interstitial and ground-glass opacities suggestive of edema 2. Suspected small left effusion Electronically Signed   By: Donavan Foil M.D.   On: 02/24/2020 19:19     Medications:   . sodium chloride     . amiodarone  200 mg Oral Daily  . brimonidine  1 drop Right Eye TID  .  brinzolamide  1 drop Right Eye TID  . diltiazem  360 mg Oral Daily  . furosemide  40 mg Intravenous Q12H  . heparin  5,000 Units Subcutaneous Q8H  . latanoprost  1 drop Both Eyes QHS  . medroxyPROGESTERone  20 mg Oral Daily  . mometasone-formoterol  2 puff Inhalation BID  . sodium chloride flush  3 mL Intravenous Q12H   sodium chloride, acetaminophen, albuterol, ondansetron (ZOFRAN) IV, sodium chloride flush  Assessment/ Plan:   Acute kidney injury on baseline of chronic kidney disease.  Admitted with volume overload.  Protein creatinine ratio 0.5.  Urine sodium less than 10.  Serological evaluation pending including serum protein electrophoresis urine protein electrophoresis and kappa lambda light chain ratio.  No real improvement in renal function.  Still continues with volume overload despite Lasix.  We will recheck renal ultrasound to make sure there is no obstruction.  Hypertension/volume.  Will increase Lasix to 80 mg every 6 hours to try to improve diuresis.  Chest x-ray was positive for pulmonary vascular congestion  Congestive heart failure diastolic dysfunction serological evaluation for monoclonal light chain.  Thyroid function studies within normal range  Atrial fibrillation continues on amiodarone with no anticoagulation secondary to history of uterine bleed.   LOS: [place] @  TODAY@7 :08 AM

## 2020-02-26 NOTE — Progress Notes (Signed)
Palliative-   Thank you for this consult- plan to meet with patient and her daughter Joseph Art tomorrow at [place], AGNP-C Palliative Medicine  Please call Palliative Medicine team phone with any questions [phone]. For individual providers please see AMION.  No charge note

## 2020-02-27 ENCOUNTER — Telehealth: Payer: Self-pay | Admitting: Family Medicine

## 2020-02-27 DIAGNOSIS — N179 Acute kidney failure, unspecified: Secondary | ICD-10-CM

## 2020-02-27 DIAGNOSIS — I509 Heart failure, unspecified: Secondary | ICD-10-CM

## 2020-02-27 DIAGNOSIS — Z7189 Other specified counseling: Secondary | ICD-10-CM

## 2020-02-27 LAB — BASIC METABOLIC PANEL
Anion gap: 9 (ref 5–15)
BUN: 57 mg/dL — ABNORMAL HIGH (ref 8–23)
CO2: 24 mmol/L (ref 22–32)
Calcium: 8.5 mg/dL — ABNORMAL LOW (ref 8.9–10.3)
Chloride: 103 mmol/L (ref 98–111)
Creatinine, Ser: 5.98 mg/dL — ABNORMAL HIGH (ref 0.44–1.00)
GFR calc Af Amer: 7 mL/min — ABNORMAL LOW (ref >60)
GFR calc non Af Amer: 6 mL/min — ABNORMAL LOW (ref >60)
Glucose, Bld: 97 mg/dL (ref 70–99)
Potassium: 4.5 mmol/L (ref 3.5–5.1)
Sodium: 136 mmol/L (ref 135–145)

## 2020-02-27 LAB — GLUCOSE, CAPILLARY
Glucose-Capillary: 120 mg/dL — ABNORMAL HIGH (ref 70–99)
Glucose-Capillary: 123 mg/dL — ABNORMAL HIGH (ref 70–99)

## 2020-02-27 NOTE — Evaluation (Signed)
Physical Therapy Evaluation Patient Details Name: Jackie Parker MRN: [id number] DOB: September 25, 1940 Today's Date: 02/27/2020   History of Present Illness  79 year old female with history of atrial fibrillation not on anticoagulation due to uterine bleeding, stage III CKD stage IIIb, chronic diastolic CHF admitted with acute on chronic CHF exacerbation. Patient was admitted to the hospital 02/14/2020 with the same she underwent right heart catheterization and echocardiogram at that time. She was adherent to her diuretics at home in spite of it patient developed shortness of breath and swelling. She also noticed new right arm swelling.She is admitted for acute on chronic diastolic CHF and AKI on CKD stage IIIb.  Clinical Impression  Pt admitted with above diagnosis. Pt was able to ambulate with min guard assist with RW short distance with pt self limiting distance due to wanting to take bath at sink. Pt able to wash on her own and changed her colostomy bag on her own.  Left her at sink to finish bath. Should progress well.   Pt currently with functional limitations due to the deficits listed below (see PT Problem List). Pt will benefit from skilled PT to increase their independence and safety with mobility to allow discharge to the venue listed below.      Follow Up Recommendations Home health PT;Supervision/Assistance - 24 hour    Equipment Recommendations  None recommended by PT    Recommendations for Other Services       Precautions / Restrictions Precautions Precautions: Fall Restrictions Weight Bearing Restrictions: No      Mobility  Bed Mobility Overal bed mobility: Independent                Transfers Overall transfer level: Needs assistance Equipment used: Rolling walker (2 wheeled) Transfers: Sit to/from Stand Sit to Stand: Supervision            Ambulation/Gait Ambulation/Gait assistance: Supervision;Min guard Gait Distance (Feet): 50 Feet Assistive device:  Rolling walker (2 wheeled) Gait Pattern/deviations: Step-through pattern;Decreased stride length   Gait velocity interpretation: <1.31 ft/sec, indicative of household ambulator General Gait Details: Pt agreed to ambulate to hall and back but did not want to go down hall.  Pt really wanted to wash up therefore walked back to sink and this PT got pt items for bathing and dressing and assist pt with her perineal area prior to leaving her to finish up bath on her own.   Stairs            Wheelchair Mobility    Modified Rankin (Stroke Patients Only)       Balance Overall balance assessment: Needs assistance Sitting-balance support: No upper extremity supported;Feet supported Sitting balance-Leahy Scale: Fair     Standing balance support: Bilateral upper extremity supported;During functional activity Standing balance-Leahy Scale: Fair Standing balance comment: can stand statically and wash her perineal area with min guard assist.                              Pertinent Vitals/Pain Pain Assessment: Faces Faces Pain Scale: Hurts even more Pain Location: right hip Pain Descriptors / Indicators: Aching;Grimacing;Guarding;Discomfort Pain Intervention(s): Limited activity within patient's tolerance;Monitored during session;Repositioned    Home Living Family/patient expects to be discharged to:: Private residence Living Arrangements: Children Available Help at Discharge: Family;Available 24 hours/day Type of Home: House Home Access: Stairs to enter Entrance Stairs-Rails: Left Entrance Stairs-Number of Steps: 2 Home Layout: One level Home Equipment: Walker - 2 wheels;Cane - single  point;Bedside commode;Shower seat;Grab bars - tub/shower Additional Comments: was independent for all gait    Prior Function Level of Independence: Independent         Comments: lives with daughter but could do some housework and all her self care     Hand Dominance   Dominant Hand:  Right    Extremity/Trunk Assessment   Upper Extremity Assessment Upper Extremity Assessment: Defer to OT evaluation    Lower Extremity Assessment Lower Extremity Assessment: Generalized weakness    Cervical / Trunk Assessment Cervical / Trunk Assessment: Normal  Communication   Communication: No difficulties  Cognition Arousal/Alertness: Awake/alert Behavior During Therapy: WFL for tasks assessed/performed Overall Cognitive Status: Within Functional Limits for tasks assessed                                        General Comments      Exercises     Assessment/Plan    PT Assessment Patient needs continued PT services  PT Problem List Decreased activity tolerance;Decreased balance;Decreased mobility;Decreased knowledge of use of DME;Decreased safety awareness;Decreased knowledge of precautions;Cardiopulmonary status limiting activity       PT Treatment Interventions DME instruction;Gait training;Functional mobility training;Therapeutic activities;Therapeutic exercise;Balance training;Patient/family education    PT Goals (Current goals can be found in the Care Plan section)  Acute Rehab PT Goals Patient Stated Goal: to go home PT Goal Formulation: With patient Time For Goal Achievement: 03/12/20 Potential to Achieve Goals: Good    Frequency Min 3X/week   Barriers to discharge        Co-evaluation               AM-PAC PT "6 Clicks" Mobility  Outcome Measure Help needed turning from your back to your side while in a flat bed without using bedrails?: None Help needed moving from lying on your back to sitting on the side of a flat bed without using bedrails?: None Help needed moving to and from a bed to a chair (including a wheelchair)?: None Help needed standing up from a chair using your arms (e.g., wheelchair or bedside chair)?: A Little Help needed to walk in hospital room?: A Little Help needed climbing 3-5 steps with a railing? : A  Little 6 Click Score: 21    End of Session Equipment Utilized During Treatment: Gait belt Activity Tolerance: Patient limited by fatigue Patient left: in chair;with call bell/phone within reach Nurse Communication: Mobility status PT Visit Diagnosis: Muscle weakness (generalized) (M62.81)    Time: 3810-1751 PT Time Calculation (min) (ACUTE ONLY): 32 min   Charges:   PT Evaluation $PT Eval Moderate Complexity: 1 Mod PT Treatments $Gait Training: 8-22 mins        Parthenia Tellefsen W,PT Acute Rehabilitation Services Pager:  [phone]  Office:  [phone]    Denice Paradise 02/27/2020, 12:24 PM

## 2020-02-27 NOTE — Progress Notes (Addendum)
[facility] ROUNDING NOTE   Subjective:   79 year old lady diastolic heart failure atrial fibrillation on amiodarone no anticoagulation secondary to uterine bleeding chronic kidney disease baseline serum creatinine 1.5 to 2 mg/dL as of November 2020.  Recent hospitalization for volume overload 02/03/2020 discharged for 02/14/2020.  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  Evaluation by nephrology believe this was cardiorenal syndrome no identified nephrotoxins.  Discharge dose of Lasix 40 mg twice daily presented with  shortness of breath and worsening renal function.  Blood pressure 93/50 pulse 75 temperature 98 O2 sats 92% room air  Sodium 136 potassium 4.5 chloride 103 CO2 24 BUN 57 creatinine 5.98 glucose 95 7 calcium 8.5 hemoglobin 12.9  ANA negative anti-GBM negative ANCA negative complements C3 slightly low 66 C4 normal kappa lambda light chain ratio 1.69 slightly increased.  No M spike on SPEP  Minimal urine output 150  cc noted 02/26/2020.  500 cc stool negative balance.  Weight 116.6 kg  Amiodarone 200 mg daily diltiazem 360 mg daily Lasix 80 mg every 6 hours.  Provera 20 mg daily, metolazone 2.5 mg daily.   Objective:  Vital signs in last 24 hours:  Temp:  [97.4 F (36.3 C)-98.2 F (36.8 C)] 98 F (36.7 C) (06/17 0411) Pulse Rate:  [76-81] 76 (06/17 0411) Resp:  [16-20] 18 (06/17 0411) BP: (93-121)/(47-60) 93/50 (06/17 0411) SpO2:  [92 %-99 %] 92 % (06/17 0411) Weight:  [116.6 kg] 116.6 kg (06/17 0411)  Weight change: 0.4 kg Filed Weights   02/25/20 0146 02/26/20 0336 02/27/20 0411  Weight: 114.6 kg 116.2 kg 116.6 kg    Intake/Output: I/O last 3 completed shifts: In: 640 [P.O.:640] Out: 800 [Urine:300; Stool:500]   Intake/Output this shift:  No intake/output data recorded.  General: Weak no acute distress HEENT:  Normal Eyes: Pupils round equal reactive no icterus or pallor Neck: Supple slightly elevated JVP no lymphadenopathy Heart: Regular rate and rhythm faint systolic murmur Lungs: Decreased air entry with occasional wheezes rales Abdomen: Soft nontender bowel sounds present no hepatosplenomegaly Extremities: No cyanosis clubbing 1+ pitting edema bilateral Skin: Warm well perfused Neuro: Grossly intact   Basic Metabolic Panel: Recent Labs  Lab 02/24/20 1945 02/24/20 1956 02/26/20 0411 02/27/20 0337  NA 137 138 137 136  K 4.3 4.2 4.6 4.5  CL 103 106 103 103  CO2 22  --  22 24  GLUCOSE 98 99 91 97  BUN 46* 47* 53* 57*  CREATININE 5.20* 5.60* 5.73* 5.98*  CALCIUM 8.8*  --  8.5* 8.5*    Liver Function Tests: Recent Labs  Lab 02/24/20 1945  AST 27  ALT 19  ALKPHOS 47  BILITOT 1.4*  PROT 8.1  ALBUMIN 3.1*   No results for input(s): LIPASE, AMYLASE in the last 168 hours. No results for input(s): AMMONIA in the last 168 hours.  CBC: Recent Labs  Lab 02/24/20 1945 02/24/20 1956  WBC 9.8  --   NEUTROABS 6.0  --   HGB 11.2* 12.9  HCT 36.6 38.0  MCV 94.6  --   PLT 157  --     Cardiac Enzymes: No results for input(s): CKTOTAL, CKMB, CKMBINDEX, TROPONINI in the last 168 hours.  BNP: Invalid input(s): POCBNP  CBG: No results for input(s): GLUCAP in the last 168 hours.  Microbiology: Results for orders placed or performed during the hospital encounter of 02/24/20  SARS Coronavirus 2 by RT PCR (hospital  order, performed in [facility] hospital lab) Nasopharyngeal Nasopharyngeal Swab     Status: None   Collection Time: 02/24/20 10:55 PM   Specimen: Nasopharyngeal Swab  Result Value Ref Range Status   SARS Coronavirus 2 NEGATIVE NEGATIVE Final    Comment: (NOTE) SARS-CoV-2 target nucleic acids are NOT DETECTED.  The SARS-CoV-2 RNA is generally detectable in upper and lower respiratory specimens during the acute phase of infection. The lowest concentration of  SARS-CoV-2 viral copies this assay can detect is 250 copies / mL. A negative result does not preclude SARS-CoV-2 infection and should not be used as the sole basis for treatment or other patient management decisions.  A negative result may occur with improper specimen collection / handling, submission of specimen other than nasopharyngeal swab, presence of viral mutation(s) within the areas targeted by this assay, and inadequate number of viral copies (<250 copies / mL). A negative result must be combined with clinical observations, patient history, and epidemiological information.  Fact Sheet for Patients:   StrictlyIdeas.no  Fact Sheet for Healthcare Providers: BankingDealers.co.za  This test is not yet approved or  cleared by the [place] FDA and has been authorized for detection and/or diagnosis of SARS-CoV-2 by FDA under an Emergency Use Authorization (EUA).  This EUA will remain in effect (meaning this test can be used) for the duration of the COVID-19 declaration under Section 564(b)(1) of the Act, 21 U.S.C. section 360bbb-3(b)(1), unless the authorization is terminated or revoked sooner.  Performed at [facility], [place] [address], [place], [place] [address]     Coagulation Studies: No results for input(s): LABPROT, INR in the last 72 hours.  Urinalysis: Recent Labs    02/24/20 2315  COLORURINE AMBER*  LABSPEC 1.017  PHURINE 5.0  GLUCOSEU NEGATIVE  HGBUR LARGE*  BILIRUBINUR NEGATIVE  KETONESUR NEGATIVE  PROTEINUR >=300*  NITRITE NEGATIVE  LEUKOCYTESUR LARGE*      Imaging: US RENAL  Result Date: 02/26/2020 CLINICAL DATA:  Acute kidney injury EXAM: RENAL / URINARY TRACT ULTRASOUND COMPLETE COMPARISON:  CT 01/22/2020 FINDINGS: Right Kidney: Renal measurements: 8 x 5.2 x 4.4 cm = volume: 96.5 mL. Renal cortical thinning. No hydronephrosis or mass. Left Kidney: Renal measurements: 8.8 x 5.1 x 5.2 cm =  volume: 120.8 mL. Renal cortical thinning. No hydronephrosis. Cyst in the midpole measuring 2.4 cm. Bladder: Not well visualized and likely empty. Other: None. IMPRESSION: 1. Renal cortical thinning suggesting mild atrophy. No hydronephrosis. 2. Cyst in the mid left kidney Electronically Signed   By: Donavan Foil M.D.   On: 02/26/2020 17:33   DG HIPS BILAT WITH PELVIS 2V  Result Date: 02/26/2020 CLINICAL DATA:  Bilateral hip pain for 1 day EXAM: DG HIP (WITH OR WITHOUT PELVIS) 2V BILAT COMPARISON:  01/22/2020 FINDINGS: Frontal and frogleg lateral views of the bilateral hips are obtained. There is bilateral hip osteoarthritis, right greater than left. No fracture, subluxation, or dislocation. Postsurgical changes at L4-5. Prominent spondylosis at the lumbosacral junction. The remainder of the bony pelvis is unremarkable. IMPRESSION: 1. Stable bilateral hip osteoarthritis, right greater than left. 2. No acute bony abnormality. Electronically Signed   By: Randa Ngo M.D.   On: 02/26/2020 15:48   VAS [place] UPPER EXTREMITY VENOUS DUPLEX  Result Date: 02/26/2020 UPPER VENOUS STUDY  Indications: Swelling Comparison Study: No prior study Performing Technologist: Maudry Mayhew MHA, RDMS, RVT, RDCS  Examination Guidelines: A complete evaluation includes B-mode imaging, spectral Doppler, color Doppler, and power Doppler as needed of all accessible  portions of each vessel. Bilateral testing is considered an integral part of a complete examination. Limited examinations for reoccurring indications may be performed as noted.  Right Findings: +----------+------------+---------+-----------+----------+--------------+ RIGHT     CompressiblePhasicitySpontaneousProperties   Summary     +----------+------------+---------+-----------+----------+--------------+ IJV         Partial   Pulsatile    Yes                             +----------+------------+---------+-----------+----------+--------------+  Subclavian    Full    Pulsatile    Yes                             +----------+------------+---------+-----------+----------+--------------+ Axillary      Full    Pulsatile    Yes                             +----------+------------+---------+-----------+----------+--------------+ Brachial      Full    Pulsatile    Yes                             +----------+------------+---------+-----------+----------+--------------+ Radial        Full                                                 +----------+------------+---------+-----------+----------+--------------+ Ulnar         Full                                                 +----------+------------+---------+-----------+----------+--------------+ Cephalic      Full                                                 +----------+------------+---------+-----------+----------+--------------+ Basilic                                             Not visualized +----------+------------+---------+-----------+----------+--------------+  Left Findings: +----------+------------+---------+-----------+----------+-------+ LEFT      CompressiblePhasicitySpontaneousPropertiesSummary +----------+------------+---------+-----------+----------+-------+ Subclavian               Yes       Yes                      +----------+------------+---------+-----------+----------+-------+  Summary:  Right: No evidence of deep vein thrombosis in the upper extremity. No evidence of superficial vein thrombosis in the upper extremity.  Left: No evidence of thrombosis in the subclavian.  *See table(s) above for measurements and observations.  Diagnosing physician: Harold Barban MD Electronically signed by Harold Barban MD on 02/26/2020 at 11:21:11 PM.    Final      Medications:   . sodium chloride     . amiodarone  200 mg Oral Daily  . brimonidine  1 drop Right Eye TID with meals  . brinzolamide  1 drop Right  Eye TID  . diltiazem  360  mg Oral Daily  . furosemide  80 mg Intravenous Q6H  . heparin  5,000 Units Subcutaneous Q8H  . latanoprost  1 drop Both Eyes QHS  . medroxyPROGESTERone  20 mg Oral Daily  . metolazone  2.5 mg Oral Daily  . mometasone-formoterol  2 puff Inhalation BID  . sodium chloride flush  3 mL Intravenous Q12H   sodium chloride, acetaminophen, albuterol, ondansetron (ZOFRAN) IV, sodium chloride flush, traMADol  Assessment/ Plan:   Acute kidney injury on baseline of chronic kidney disease.  Admitted with volume overload.  Protein creatinine ratio 0.5.  Urine sodium less than 10.  Serological evaluation unremarkable.  Renal ultrasound no hydronephrosis 02/26/2020.  Question would be whether to proceed with dialysis.  Hypertension/volume.  Increased Lasix to 80 mg every 6 hours, metolazone 2.5 mg daily to try to improve diuresis.  Chest x-ray was positive for pulmonary vascular congestion.  Blood pressure low will discontinue diltiazem.  Congestive heart failure diastolic dysfunction serological evaluation unremarkable.  Atrial fibrillation continues on amiodarone with no anticoagulation secondary to history of uterine bleed.  Disposition palliative medicine involved to meet with family.  Question would be whether to proceed with dialysis or not.  This will depend on discussions with family later today.   LOS: [place] @TODAY @7 :02 AM

## 2020-02-27 NOTE — Consult Note (Signed)
Consultation Note Date: 02/27/2020   Patient Name: Jackie Parker  DOB: [birth year]  MRN: [id number]  Age / Sex: 79 y.o., female  PCP: [name], MD Referring Physician: Georgette Shell, MD  Reason for Consultation: Establishing goals of care  HPI/Patient Profile: 79 y.o. female  with past medical history of a fib- not on anticoag due to uterine bleeding, CKD, CHF, ulcerative colitis s/p colectomy with colostomy in place recent admission 5/24-6/4 for acute CHF exacerbation now readmitted on 02/24/2020 with shortness of breath and swelling secondary to CHF exacerbation and worsening renal function. Palliative medicine consulted for goals of care and discussion regarding initiation of dialysis.    Clinical Assessment and Goals of Care: I spoke with patient's daughter Joseph Art' yesterday via phone and arranged meeting today, however, daughter did not arrive for our scheduled meeting.  Patient was sitting up at bedside alert and oriented in no apparent distress.  She lives at home with her daughter. Is independent with ADL's. She gets joy from spending time with her family and her grandchildren. We discussed her comorbidities and how they are chronic and progressive. It is not clear that she fully understands her diagnosis. I attempted to provide education- this was limited due to low health literacy and possibly some difficulty hearing. She expressed some feelings of dissatisfaction that noone had told her several years ago about her medical problems. When asked her feelings about dialysis she stated she would not want dialysis- I then asked, what if she needed it in order to live? She stated- "well, I guess my time would just be done".  I asked her if she would prefer to just be kept comfortable and be allowed to go see God and she said yes. However, when I asked her if she would want help from Hospice for this  she said No, she didn't think she needed that yet.  We discussed with her the concern that given her heart failure and her progressing kidney failure- and her feelings regarding dialysis- that the fluid would build back up quickly and she would need either symptom management through dying, return hospitalization, and or/dialysis for relief.  We discussed code status. R[facility] has not considered her wishes regarding code status or advanced directives before.  A copy of [facility] Cho[facility] was left for her review.   Primary Decision Maker PATIENT    SUMMARY OF RECOMMENDATIONS -Encouraged patient to consider all options of advanced care planning- she would like to discuss her choices with her children including code status and decision of starting dialysis or not -PMT will attempt to reach daughter Joseph Art' -Recommend outpatient Palliative f/u at home    Code Status/Advance Care Planning:  Full code  Additional Recommendations (Limitations, Scope, Preferences):  Full Scope Treatment  Prognosis:    Unable to determine  Discharge Planning: To Be Determined  Primary Diagnoses: Present on Admission: . Acute on chronic diastolic CHF (congestive heart failure) ([place]) . Acute renal failure superimposed on stage 4 chronic kidney disease ([place]) . AF (paroxysmal atrial fibrillation) ([place])  I have reviewed the medical record, interviewed the patient and family, and examined the patient. The following aspects are pertinent.  Past Medical History:  Diagnosis Date  . AKI (acute kidney injury) ([place]) 12/2015  . Allergy    takes Singulair and Zyrtec daily  . Asthma    uses Adair daily  . Cataracts, bilateral   . CHF (congestive heart failure) ([place])   . Colitis, ulcerative ([place])   . DJD (degenerative joint disease)   . Glaucoma    uses eye drops daily  . History of blood transfusion    no abnormal reaction noted. 40+ yrs ago  . History of bronchitis    yrs ago  . History of gout    not on any  meds  . Hypertension    takes Amlodipine daily  . Joint pain   . Joint swelling   . OSA (obstructive sleep apnea) 12/17/2019  . PMB (postmenopausal bleeding)   . Psoriasis   . Urinary frequency    takes Ditropan daily  . Vertigo    doesn't take any meds  . Vitamin D deficiency    Social History   Socioeconomic History  . Marital status: Single    Spouse name: Not on file  . Number of children: Not on file  . Years of education: Not on file  . Highest education level: Not on file  Occupational History  . Not on file  Tobacco Use  . Smoking status: Never Smoker  . Smokeless tobacco: Never Used  Vaping Use  . Vaping Use: Never used  Substance and Sexual Activity  . Alcohol use: No    Alcohol/week: 0.0 standard drinks  . Drug use: No  . Sexual activity: Not Currently    Birth control/protection: Post-menopausal  Other Topics Concern  . Not on file  Social History Narrative  . Not on file   Social Determinants of Health   Financial Resource Strain:   . Difficulty of Paying Living Expenses:   Food Insecurity:   . Worried About Charity fundraiser in the Last Year:   . Arboriculturist in the Last Year:   Transportation Needs:   . Film/video editor (Medical):   Marland Kitchen Lack of Transportation (Non-Medical):   Physical Activity:   . Days of Exercise per Week:   . Minutes of Exercise per Session:   Stress:   . Feeling of Stress :   Social Connections:   . Frequency of Communication with Friends and Family:   . Frequency of Social Gatherings with Friends and Family:   . Attends Religious Services:   . Active Member of Clubs or Organizations:   . Attends Archivist Meetings:   Marland Kitchen Marital Status:    Family History  Problem Relation Age of Onset  . Hypertension Sister   . Glaucoma Sister   . Endometrial cancer Neg Hx   . Ovarian cancer Neg Hx    Scheduled Meds: . amiodarone  200 mg Oral Daily  . brimonidine  1 drop Right Eye TID with meals  .  brinzolamide  1 drop Right Eye TID  . furosemide  80 mg Intravenous Q6H  . heparin  5,000 Units Subcutaneous Q8H  . latanoprost  1 drop Both Eyes QHS  . medroxyPROGESTERone  20 mg Oral Daily  . metolazone  2.5 mg Oral Daily  . mometasone-formoterol  2 puff Inhalation BID  . sodium chloride flush  3 mL Intravenous Q12H   Continuous Infusions: .  sodium chloride     PRN Meds:.sodium chloride, acetaminophen, albuterol, ondansetron (ZOFRAN) IV, sodium chloride flush, traMADol Medications Prior to Admission:  Prior to Admission medications   Medication Sig Start Date End Date Taking? Authorizing Provider  albuterol (PROAIR HFA) 108 (90 Base) MCG/ACT inhaler Inhale 1 puff into the lungs every 6 (six) hours as needed for wheezing or shortness of breath.   Yes [provider]  albuterol (PROVENTIL) (2.5 MG/3ML) 0.083% nebulizer solution Take 2.5 mg by nebulization 2 (two) times daily as needed for wheezing or shortness of breath.    Yes [provider]  allopurinol (ZYLOPRIM) 100 MG tablet TAKE 1 TABLET(100 MG) BY MOUTH DAILY Patient taking differently: Take 100 mg by mouth as needed (gout).  04/01/19  Yes [name], MD  amiodarone (PACERONE) 200 MG tablet Take 1 tablet (200 mg total) by mouth daily. 11/21/19  Yes Fenton, Clint R, PA  brimonidine (ALPHAGAN) 0.2 % ophthalmic solution Place 1 drop into the right eye 3 (three) times daily. 06/18/19  Yes [provider]  brinzolamide (AZOPT) 1 % ophthalmic suspension Place 1 drop into the right eye 3 (three) times daily.    Yes [provider]  colchicine 0.6 MG tablet Take 2 tabs (1.2 mg) at the onset of a gout attack, may repeat 1 tab (0.6 mg) 2 hours later if symptoms persist. 10/22/18  Yes [name], MD  diltiazem (CARDIZEM CD) 360 MG 24 hr capsule Take 1 capsule (360 mg total) by mouth daily. 11/13/19 11/12/20 Yes Fenton, Clint R, PA  fluocinonide ointment (LIDEX) 4.09 % Apply 1 application topically 2 (two)  times daily as needed for itching. 07/24/19  Yes [provider]  Fluticasone-Salmeterol (ADVAIR DISKUS) 500-50 MCG/DOSE AEPB Inhale 1 puff into the lungs in the morning and at bedtime. 11/27/19  Yes Olalere, Adewale A, MD  furosemide (LASIX) 40 MG tablet Take 1 tablet (40 mg total) by mouth 2 (two) times daily. 02/14/20  Yes Furth, Cadence H, PA-C  LUMIGAN 0.01 % SOLN Place 1 drop into both eyes at bedtime. 05/27/16  Yes [provider]  medroxyPROGESTERone (PROVERA) 10 MG tablet Take 2 tablets (20 mg total) by mouth daily. 01/22/20  Yes Everitt Amber, MD  potassium chloride (KLOR-CON) 10 MEQ tablet Take 4 tablets (40 mEq total) by mouth 2 (two) times daily for 5 days. Patient taking differently: Take 20 mEq by mouth 2 (two) times daily.  12/06/19 02/23/29 Yes Allred, Jeneen Rinks, MD  prochlorperazine (COMPAZINE) 10 MG tablet TAKE 1 TABLET(10 MG) BY MOUTH TWICE DAILY AS NEEDED FOR NAUSEA OR VOMITING Patient taking differently: Take 10 mg by mouth as needed for nausea or vomiting.  11/01/18  Yes [name], MD   Allergies  Allergen Reactions  . Megace Es [Megestrol Acetate] Shortness Of Breath  . Sulfonamide Derivatives Rash   Review of Systems  Constitutional: Positive for activity change.  Cardiovascular: Positive for leg swelling.    Physical Exam Vitals and nursing note reviewed.     Vital Signs: BP (!) 123/53   Pulse 79   Temp 98 F (36.7 C) (Oral)   Resp 20   Ht 5' 2"  (1.575 m)   Wt 116.6 kg   SpO2 92%   BMI 47.02 kg/m  Pain Scale: 0-10   Pain Score: 0-No pain   SpO2: SpO2: 92 % O2 Device:SpO2: 92 % O2 Flow Rate: .   IO: Intake/output summary:   Intake/Output Summary (Last 24 hours) at 02/27/2020 1324 Last data filed at 02/27/2020  1000 Gross per 24 hour  Intake 340 ml  Output 750 ml  Net -410 ml    LBM: Last BM Date: 02/25/20 Baseline Weight: Weight: 114.6 kg Most recent weight: Weight: 116.6 kg     Palliative Assessment/Data: PPS:  50%     Thank you for this consult. Palliative medicine will continue to follow and assist as needed.   Time In: 1130 Time Out: 1245 Time Total: 75 minutes Greater than 50%  of this time was spent counseling and coordinating care related to the above assessment and plan.  Signed by: Mariana Kaufman, AGNP-C Palliative Medicine    Please contact Palliative Medicine Team phone at [phone] for questions and concerns.  For individual provider: See Shea Evans

## 2020-02-27 NOTE — Care Management Important Message (Signed)
Important Message  Patient Details  Name: Jackie Parker MRN: [id number] Date of Birth: 1941/01/19   Medicare Important Message Given:  Yes     Shelda Altes 02/27/2020, 8:30 AM

## 2020-02-27 NOTE — Progress Notes (Signed)
PROGRESS NOTE    Jackie Parker  RSW:[id number] DOB: June 28, 1941 DOA: 02/24/2020 PCP: [name], MD    Brief Narrative: 79 year old female with history of atrial fibrillation not on anticoagulation due to uterine bleeding, stage III CKD stage IIIb, chronic diastolic CHF admitted with acute on chronic CHF exacerbation.  Patient was admitted to the hospital 02/14/2020 with the same she underwent right heart catheterization and echocardiogram at that time.  She was adherent to her diuretics at home in spite of it patient developed shortness of breath and swelling.  She also noticed new right arm swelling. She is admitted for acute on chronic diastolic CHF and AKI on CKD stage IIIb.   Assessment & Plan:   Principal Problem:   Acute on chronic diastolic CHF (congestive heart failure) (HCC) Active Problems:   Acute renal failure superimposed on stage 4 chronic kidney disease (H[facility])   AF (paroxysmal atrial fibrillation) ([facility])   #1 recurrent acute on chronic diastolic CHF-followed by cardiology and nephrology. Lasix dose increased to 3 times a day since urine output has not improved.  Added Zaroxolyn 2.5 mg without much improvement. Echo with normal systolic function. Cardiac cath earlier this month moderate pulmonary hypertension PT consult She remains fluid overloaded in spite of increasing Lasix and adding Zaroxolyn this is complicated by soft blood pressure. Appreciate palliative care input and there discussion with family today to decide on where to go from here.  #2 AKI on CKD stage IIIb to stage IV-her baseline creatinine is around 2.   At the time of admission her creatinine was 5.2.  Creatinine up to 5.98 today. At the time of discharge from the hospital after cardiac cath earlier this month her creatinine was 3.94. Renal ultrasound no hydronephrosis renal cortical thinning suggesting mild atrophy.  Cyst in the mid left kidney. Normal TSH.  #3 paroxysmal atrial fibrillation  on amiodarone and diltiazem.    Diltiazem stopped today due to soft blood pressure.  Not on anticoagulation due to fibroid bleeding  #4 abnormal UA since patient not symptomatic not started on antibiotics.  #5 right arm swelling -  #6 history of ulcerative colitis and colectomy with a colostomy bag in place draining liquid stool  #7 morbid obesity nutrition consult  #8 bleeding fibroids on medroxyprogesterone continue.  No bleeds reported from overnight staff.    Estimated body mass index is 47.02 kg/m as calculated from the following:   Height as of this encounter: 5' 2"  (1.575 m).   Weight as of this encounter: 116.6 kg.  DVT prophylaxis: Subcu heparin  code Status: Full code Family Communication: None at bedside patient reports she lives at home with her daughter  disposition Plan:  Status is: Inpatient  Dispo:              Patient From: Home             Planned Disposition: To be determined             Expected discharge date: 03/02/20             Medically stable for discharge: No patient with fluid overload and worsening renal function on IV diuretics    Consultants:   Cardiology and nephrology  Procedures: None Antimicrobials none    Subjective:  She is resting in bed She feels like her breathing is not much better She got out of bed with physical therapy yesterday but was not able to walk much.  Objective: Vitals:   02/27/20 0411 02/27/20 0703  02/27/20 0721 02/27/20 0944  BP: (!) 93/50  (!) 86/58 (!) 123/53  Pulse: 76  85 79  Resp: 18  20   Temp: 98 F (36.7 C)  98 F (36.7 C)   TempSrc: Oral  Oral   SpO2: 92% 90% 92%   Weight: 116.6 kg     Height:        Intake/Output Summary (Last 24 hours) at 02/27/2020 1042 Last data filed at 02/27/2020 0626 Gross per 24 hour  Intake 340 ml  Output 750 ml  Net -410 ml   Filed Weights   02/25/20 0146 02/26/20 0336 02/27/20 0411  Weight: 114.6 kg 116.2 kg 116.6 kg    Examination:  General  exam: Appears calm and comfortable  Respiratory system: Crackles at bilateral bases to auscultation. Respiratory effort normal. Cardiovascular system: S1 & S2 heard, RRR. No JVD, murmurs, rubs, gallops or clicks. No pedal edema. Gastrointestinal system: Abdomen is nondistended, soft and nontender. No organomegaly or masses felt. Normal bowel sounds heard. Central nervous system: Alert and oriented. No focal neurological deficits. Extremities: 3+ edema edema all the way up to her upper thighs and pelvis.  Chronic venous stasis changes in both lower extremities noted. Skin: No rashes, lesions or ulcers Psychiatry: Judgement and insight appear normal. Mood & affect appropriate.     Data Reviewed: I have personally reviewed following labs and imaging studies  CBC: Recent Labs  Lab 02/24/20 1945 02/24/20 1956  WBC 9.8  --   NEUTROABS 6.0  --   HGB 11.2* 12.9  HCT 36.6 38.0  MCV 94.6  --   PLT 157  --    Basic Metabolic Panel: Recent Labs  Lab 02/24/20 1945 02/24/20 1956 02/26/20 0411 02/27/20 0337  NA 137 138 137 136  K 4.3 4.2 4.6 4.5  CL 103 106 103 103  CO2 22  --  22 24  GLUCOSE 98 99 91 97  BUN 46* 47* 53* 57*  CREATININE 5.20* 5.60* 5.73* 5.98*  CALCIUM 8.8*  --  8.5* 8.5*   GFR: Estimated Creatinine Clearance: 9.4 mL/min (A) (by C-G formula based on SCr of 5.98 mg/dL (H)). Liver Function Tests: Recent Labs  Lab 02/24/20 1945  AST 27  ALT 19  ALKPHOS 47  BILITOT 1.4*  PROT 8.1  ALBUMIN 3.1*   No results for input(s): LIPASE, AMYLASE in the last 168 hours. No results for input(s): AMMONIA in the last 168 hours. Coagulation Profile: No results for input(s): INR, PROTIME in the last 168 hours. Cardiac Enzymes: No results for input(s): CKTOTAL, CKMB, CKMBINDEX, TROPONINI in the last 168 hours. BNP (last 3 results) Recent Labs    12/02/19 1224  PROBNP 779*   HbA1C: No results for input(s): HGBA1C in the last 72 hours. CBG: No results for input(s):  GLUCAP in the last 168 hours. Lipid Profile: No results for input(s): CHOL, HDL, LDLCALC, TRIG, CHOLHDL, LDLDIRECT in the last 72 hours. Thyroid Function Tests: Recent Labs    02/25/20 0944  TSH 2.290   Anemia Panel: No results for input(s): VITAMINB12, FOLATE, FERRITIN, TIBC, IRON, RETICCTPCT in the last 72 hours. Sepsis Labs: No results for input(s): PROCALCITON, LATICACIDVEN in the last 168 hours.  Recent Results (from the past 240 hour(s))  SARS CORONAVIRUS 2 (TAT 6-24 HRS) Nasopharyngeal Nasopharyngeal Swab     Status: None   Collection Time: 02/22/20 12:02 PM   Specimen: Nasopharyngeal Swab  Result Value Ref Range Status   SARS Coronavirus 2 NEGATIVE NEGATIVE Final  Comment: (NOTE) SARS-CoV-2 target nucleic acids are NOT DETECTED.  The SARS-CoV-2 RNA is generally detectable in upper and lower respiratory specimens during the acute phase of infection. Negative results do not preclude SARS-CoV-2 infection, do not rule out co-infections with other pathogens, and should not be used as the sole basis for treatment or other patient management decisions. Negative results must be combined with clinical observations, patient history, and epidemiological information. The expected result is Negative.  Fact Sheet for Patients: SugarRoll.be  Fact Sheet for Healthcare Providers: https://www.woods-.com/  This test is not yet approved or cleared by the [place] FDA and  has been authorized for detection and/or diagnosis of SARS-CoV-2 by FDA under an Emergency Use Authorization (EUA). This EUA will remain  in effect (meaning this test can be used) for the duration of the COVID-19 declaration under Se ction 564(b)(1) of the Act, 21 U.S.C. section 360bbb-3(b)(1), unless the authorization is terminated or revoked sooner.  Performed at [facility], [place] [address], [place], [place] [address]   SARS Coronavirus 2 by RT PCR  (hospital order, performed in [facility] hospital lab) Nasopharyngeal Nasopharyngeal Swab     Status: None   Collection Time: 02/24/20 10:55 PM   Specimen: Nasopharyngeal Swab  Result Value Ref Range Status   SARS Coronavirus 2 NEGATIVE NEGATIVE Final    Comment: (NOTE) SARS-CoV-2 target nucleic acids are NOT DETECTED.  The SARS-CoV-2 RNA is generally detectable in upper and lower respiratory specimens during the acute phase of infection. The lowest concentration of SARS-CoV-2 viral copies this assay can detect is 250 copies / mL. A negative result does not preclude SARS-CoV-2 infection and should not be used as the sole basis for treatment or other patient management decisions.  A negative result may occur with improper specimen collection / handling, submission of specimen other than nasopharyngeal swab, presence of viral mutation(s) within the areas targeted by this assay, and inadequate number of viral copies (<250 copies / mL). A negative result must be combined with clinical observations, patient history, and epidemiological information.  Fact Sheet for Patients:   StrictlyIdeas.no  Fact Sheet for Healthcare Providers: BankingDealers.co.za  This test is not yet approved or  cleared by the [place] FDA and has been authorized for detection and/or diagnosis of SARS-CoV-2 by FDA under an Emergency Use Authorization (EUA).  This EUA will remain in effect (meaning this test can be used) for the duration of the COVID-19 declaration under Section 564(b)(1) of the Act, 21 U.S.C. section 360bbb-3(b)(1), unless the authorization is terminated or revoked sooner.  Performed at [facility], [place] [address] [place], [place] [address]          Radiology Studies: US RENAL  Result Date: 02/26/2020 CLINICAL DATA:  Acute kidney injury EXAM: RENAL / URINARY TRACT ULTRASOUND COMPLETE COMPARISON:  CT 01/22/2020 FINDINGS: Right  Kidney: Renal measurements: 8 x 5.2 x 4.4 cm = volume: 96.5 mL. Renal cortical thinning. No hydronephrosis or mass. Left Kidney: Renal measurements: 8.8 x 5.1 x 5.2 cm = volume: 120.8 mL. Renal cortical thinning. No hydronephrosis. Cyst in the midpole measuring 2.4 cm. Bladder: Not well visualized and likely empty. Other: None. IMPRESSION: 1. Renal cortical thinning suggesting mild atrophy. No hydronephrosis. 2. Cyst in the mid left kidney Electronically Signed   By: Donavan Foil M.D.   On: 02/26/2020 17:33   DG HIPS BILAT WITH PELVIS 2V  Result Date: 02/26/2020 CLINICAL DATA:  Bilateral hip pain for 1 day EXAM: DG HIP (  WITH OR WITHOUT PELVIS) 2V BILAT COMPARISON:  01/22/2020 FINDINGS: Frontal and frogleg lateral views of the bilateral hips are obtained. There is bilateral hip osteoarthritis, right greater than left. No fracture, subluxation, or dislocation. Postsurgical changes at L4-5. Prominent spondylosis at the lumbosacral junction. The remainder of the bony pelvis is unremarkable. IMPRESSION: 1. Stable bilateral hip osteoarthritis, right greater than left. 2. No acute bony abnormality. Electronically Signed   By: Randa Ngo M.D.   On: 02/26/2020 15:48   VAS [place] UPPER EXTREMITY VENOUS DUPLEX  Result Date: 02/26/2020 UPPER VENOUS STUDY  Indications: Swelling Comparison Study: No prior study Performing Technologist: Maudry Mayhew MHA, RDMS, RVT, RDCS  Examination Guidelines: A complete evaluation includes B-mode imaging, spectral Doppler, color Doppler, and power Doppler as needed of all accessible portions of each vessel. Bilateral testing is considered an integral part of a complete examination. Limited examinations for reoccurring indications may be performed as noted.  Right Findings: +----------+------------+---------+-----------+----------+--------------+ RIGHT     CompressiblePhasicitySpontaneousProperties   Summary      +----------+------------+---------+-----------+----------+--------------+ IJV         Partial   Pulsatile    Yes                             +----------+------------+---------+-----------+----------+--------------+ Subclavian    Full    Pulsatile    Yes                             +----------+------------+---------+-----------+----------+--------------+ Axillary      Full    Pulsatile    Yes                             +----------+------------+---------+-----------+----------+--------------+ Brachial      Full    Pulsatile    Yes                             +----------+------------+---------+-----------+----------+--------------+ Radial        Full                                                 +----------+------------+---------+-----------+----------+--------------+ Ulnar         Full                                                 +----------+------------+---------+-----------+----------+--------------+ Cephalic      Full                                                 +----------+------------+---------+-----------+----------+--------------+ Basilic                                             Not visualized +----------+------------+---------+-----------+----------+--------------+  Left Findings: +----------+------------+---------+-----------+----------+-------+ LEFT      CompressiblePhasicitySpontaneousPropertiesSummary +----------+------------+---------+-----------+----------+-------+ Subclavian  Yes       Yes                      +----------+------------+---------+-----------+----------+-------+  Summary:  Right: No evidence of deep vein thrombosis in the upper extremity. No evidence of superficial vein thrombosis in the upper extremity.  Left: No evidence of thrombosis in the subclavian.  *See table(s) above for measurements and observations.  Diagnosing physician: Harold Barban MD Electronically signed by Harold Barban MD on  02/26/2020 at 11:21:11 PM.    Final         Scheduled Meds: . amiodarone  200 mg Oral Daily  . brimonidine  1 drop Right Eye TID with meals  . brinzolamide  1 drop Right Eye TID  . furosemide  80 mg Intravenous Q6H  . heparin  5,000 Units Subcutaneous Q8H  . latanoprost  1 drop Both Eyes QHS  . medroxyPROGESTERone  20 mg Oral Daily  . metolazone  2.5 mg Oral Daily  . mometasone-formoterol  2 puff Inhalation BID  . sodium chloride flush  3 mL Intravenous Q12H   Continuous Infusions: . sodium chloride       LOS: 3 days     Georgette Shell, MD  02/27/2020, 10:42 AM

## 2020-02-27 NOTE — Telephone Encounter (Signed)
Will route to PCP for review. 

## 2020-02-27 NOTE — Telephone Encounter (Signed)
[facility] Aeronautical engineer with [facility] called to let patient PCP know that patient was admitted to the [facility].

## 2020-02-28 ENCOUNTER — Other Ambulatory Visit: Payer: Medicare Other

## 2020-02-28 DIAGNOSIS — N189 Chronic kidney disease, unspecified: Secondary | ICD-10-CM

## 2020-02-28 DIAGNOSIS — Z515 Encounter for palliative care: Secondary | ICD-10-CM

## 2020-02-28 LAB — BASIC METABOLIC PANEL
Anion gap: 9 (ref 5–15)
BUN: 61 mg/dL — ABNORMAL HIGH (ref 8–23)
CO2: 24 mmol/L (ref 22–32)
Calcium: 8.5 mg/dL — ABNORMAL LOW (ref 8.9–10.3)
Chloride: 102 mmol/L (ref 98–111)
Creatinine, Ser: 6.35 mg/dL — ABNORMAL HIGH (ref 0.44–1.00)
GFR calc Af Amer: 7 mL/min — ABNORMAL LOW (ref >60)
GFR calc non Af Amer: 6 mL/min — ABNORMAL LOW (ref >60)
Glucose, Bld: 92 mg/dL (ref 70–99)
Potassium: 5.2 mmol/L — ABNORMAL HIGH (ref 3.5–5.1)
Sodium: 135 mmol/L (ref 135–145)

## 2020-02-28 LAB — IMMUNOFIXATION ELECTROPHORESIS
IgA: 917 mg/dL — ABNORMAL HIGH (ref 64–422)
IgG (Immunoglobin G), Serum: 2730 mg/dL — ABNORMAL HIGH (ref 586–1602)
IgM (Immunoglobulin M), Srm: 152 mg/dL (ref 26–217)
Total Protein ELP: 7.3 g/dL (ref 6.0–8.5)

## 2020-02-28 MED ORDER — SODIUM ZIRCONIUM CYCLOSILICATE 10 G PO PACK
10.0000 g | PACK | Freq: Three times a day (TID) | ORAL | Status: DC
  Administered 2020-02-28 – 2020-03-01 (×6): 10 g via ORAL
  Filled 2020-02-28 (×6): qty 1

## 2020-02-28 NOTE — Progress Notes (Signed)
Brief cardiology follow up note:  Appreciate nephrology involvement, reviewed ongoing discussions and risk/benefit of starting dialysis.   Unfortunately at this time there are limited options for her heart failure from a cardiac perspective given her renal function and blood pressure. Would continue amoidarone for her history of atrial fibrillation.  [facility] will sign off.   Medication Recommendations:  Continue amiodarone Other recommendations (labs, testing, etc):  none Follow up as an outpatient:  She has an appt with Kathyrn Drown on 03/23/20

## 2020-02-28 NOTE — Progress Notes (Signed)
  Mobility Specialist Criteria Algorithm Info.  Mobility Team: HOB elevated: Activity: Ambulated in hall;Transferred:  Bed to chair;Dangled on edge of bed (to chair after ambulation. ) Range of motion: Active;All extremities Level of assistance: Standby assist, set-up cues, supervision of patient - no hands on Assistive device: Front wheel walker Minutes sitting in chair:  Minutes stood: 5 minutes Minutes ambulated: 5 minutes Distance ambulated (ft): 30 ft Mobility response: Tolerated well Bed Position: Chair (Recliner chair)  HR Pre: 90 HR Post: 120  Pt was eager to ambulate this morning. C/o pain in LLE causing a steady but abnormal gait, no other complaints. Patient ambulated 60f in hallway and on the way back to patients room, HR was elevated 140+ requiring seated rest. Patient explained she could feel her heart racing but denied CP or dizziness/lightheadedness. Patient rested in chair for approximately 10 minutes and HR was still elevated 120+ but agreed to finish ambulation and sit in recliner chair.   02/28/2020 10:55 AM

## 2020-02-28 NOTE — Progress Notes (Signed)
Paged MD regarding administration of 80 IV Lasix.  BP 108/59. MD stated to hold IV lasix.

## 2020-02-28 NOTE — Progress Notes (Signed)
PROGRESS NOTE    Jackie Parker  MHD:[id number] DOB: 1941-06-24 DOA: 02/24/2020 PCP: [name], MD    Brief Narrative:79 year old female with history of atrial fibrillation not on anticoagulation due to uterine bleeding, stage III CKD stage IIIb, chronic diastolic CHF admitted with acute on chronic CHF exacerbation. Patient was admitted to the hospital 02/14/2020 with the same she underwent right heart catheterization and echocardiogram at that time. She was adherent to her diuretics at home in spite of it patient developed shortness of breath and swelling. She also noticed new right arm swelling. She is admitted for acute on chronic diastolic CHF and AKI on CKD stage IIIb.   Assessment & Plan:   Principal Problem:   Acute on chronic diastolic CHF (congestive heart failure) (HCC) Active Problems:   Acute renal failure superimposed on stage 4 chronic kidney disease (HCC)   AF (paroxysmal atrial fibrillation) (HCC)   Acute kidney failure (HCC)   Acute on chronic congestive heart failure (HCC)   Goals of care, counseling/discussion   Advanced care planning/counseling discussion     #1 recurrent acute on chronic diastolic CHF-followed by cardiology and nephrology. Lasix dose increased to 3 times a day since urine output has not improved.  Added Zaroxolyn 2.5 mg without much improvement. Echo with normal systolic function. Cardiac cath earlier this month moderate pulmonary hypertension PT consult She remains fluid overloaded in spite of increasing Lasix and adding Zaroxolyn this is complicated by soft blood pressure. Appreciate palliative care input and there discussion with family today to decide on where to go from here.  #2 AKI on CKD stage IIIb to stage IV-her baseline creatinine is around 2.  Urine output last 24 hours 650 cc. At the time of admission her creatinine was 5.2. Creatinine up to 6.35 from 5.98 . At the time of discharge from the hospital after cardiac  cath earlier this month her creatinine was 3.94. Renal ultrasound no hydronephrosis renal cortical thinning suggesting mild atrophy.  Cyst in the mid left kidney. Normal TSH.  #3 paroxysmal atrial fibrillation on amiodarone and diltiazem.   Diltiazem stopped today due to soft blood pressure.  Not on anticoagulation due to fibroid bleeding  #4 abnormal UA since patient not symptomatic not started on antibiotics.  #5 right arm swelling-  #6 history of ulcerative colitis and colectomy with a colostomy bag in place draining liquid stool  #7 morbid obesity nutrition consult  #8 bleeding fibroids on medroxyprogesterone continue.  No bleeds reported from overnight staff.   Estimated body mass index is 46.4 kg/m as calculated from the following:   Height as of this encounter: 5' 2"  (1.575 m).   Weight as of this encounter: 115.1 kg.   DVT prophylaxis:Subcu heparin  code Status:Full code Family Communication:None at bedside patient reports she lives at home with her daughter  disposition Plan:Status is: Inpatient  Dispo:  Patient From: Home Planned Disposition: To be determined Expected discharge date: 03/02/20 Medically stable for discharge: Nopatient with fluid overload and worsening renal function on IV diuretics   Consultants:  Cardiology and nephrology  Procedures:None Antimicrobialsnone    Subjective: Patient resting in bed does not appear to be in any distress she is on room air.  She has not made up her mind whether she should undergo dialysis or not she wants to talk to the rest of the family members and come up with a plan.  Objective: Vitals:   02/28/20 0404 02/28/20 0635 02/28/20 0711 02/28/20 1156  BP: (!) 113/53 (!) 108/59  Marland Kitchen)  120/52  Pulse: 81   83  Resp: 20   20  Temp: 98.1 F (36.7 C)   (!) 97.5 F (36.4 C)  TempSrc: Oral   Oral  SpO2: 94%  90% 97%  Weight: 115.1 kg     Height:         Intake/Output Summary (Last 24 hours) at 02/28/2020 1556 Last data filed at 02/28/2020 0834 Gross per 24 hour  Intake 720 ml  Output 450 ml  Net 270 ml   Filed Weights   02/26/20 0336 02/27/20 0411 02/28/20 0404  Weight: 116.2 kg 116.6 kg 115.1 kg    Examination:  General exam: Appears calm and comfortable  Respiratory system: Crackles to auscultation. Respiratory effort normal. Cardiovascular system: S1 & S2 heard, RRR. No JVD, murmurs, rubs, gallops or clicks. No pedal edema. Gastrointestinal system: Abdomen is nondistended, soft and nontender. No organomegaly or masses felt. Normal bowel sounds heard. Central nervous system: Alert and oriented. No focal neurological deficits. Extremities: 2+ pitting edema all the way up to the upper thighs. Skin: No rashes, lesions or ulcers Psychiatry: Judgement and insight appear normal. Mood & affect appropriate.     Data Reviewed: I have personally reviewed following labs and imaging studies  CBC: Recent Labs  Lab 02/24/20 1945 02/24/20 1956  WBC 9.8  --   NEUTROABS 6.0  --   HGB 11.2* 12.9  HCT 36.6 38.0  MCV 94.6  --   PLT 157  --    Basic Metabolic Panel: Recent Labs  Lab 02/24/20 1945 02/24/20 1956 02/26/20 0411 02/27/20 0337 02/28/20 0518  NA 137 138 137 136 135  K 4.3 4.2 4.6 4.5 5.2*  CL 103 106 103 103 102  CO2 22  --  22 24 24   GLUCOSE 98 99 91 97 92  BUN 46* 47* 53* 57* 61*  CREATININE 5.20* 5.60* 5.73* 5.98* 6.35*  CALCIUM 8.8*  --  8.5* 8.5* 8.5*   GFR: Estimated Creatinine Clearance: 8.8 mL/min (A) (by C-G formula based on SCr of 6.35 mg/dL (H)). Liver Function Tests: Recent Labs  Lab 02/24/20 1945  AST 27  ALT 19  ALKPHOS 47  BILITOT 1.4*  PROT 8.1  ALBUMIN 3.1*   No results for input(s): LIPASE, AMYLASE in the last 168 hours. No results for input(s): AMMONIA in the last 168 hours. Coagulation Profile: No results for input(s): INR, PROTIME in the last 168 hours. Cardiac Enzymes: No  results for input(s): CKTOTAL, CKMB, CKMBINDEX, TROPONINI in the last 168 hours. BNP (last 3 results) Recent Labs    12/02/19 1224  PROBNP 779*   HbA1C: No results for input(s): HGBA1C in the last 72 hours. CBG: Recent Labs  Lab 02/27/20 1618 02/27/20 2135  GLUCAP 120* 123*   Lipid Profile: No results for input(s): CHOL, HDL, LDLCALC, TRIG, CHOLHDL, LDLDIRECT in the last 72 hours. Thyroid Function Tests: No results for input(s): TSH, T4TOTAL, FREET4, T3FREE, THYROIDAB in the last 72 hours. Anemia Panel: No results for input(s): VITAMINB12, FOLATE, FERRITIN, TIBC, IRON, RETICCTPCT in the last 72 hours. Sepsis Labs: No results for input(s): PROCALCITON, LATICACIDVEN in the last 168 hours.  Recent Results (from the past 240 hour(s))  SARS CORONAVIRUS 2 (TAT 6-24 HRS) Nasopharyngeal Nasopharyngeal Swab     Status: None   Collection Time: 02/22/20 12:02 PM   Specimen: Nasopharyngeal Swab  Result Value Ref Range Status   SARS Coronavirus 2 NEGATIVE NEGATIVE Final    Comment: (NOTE) SARS-CoV-2 target nucleic acids are NOT DETECTED.  The SARS-CoV-2 RNA is generally detectable in upper and lower respiratory specimens during the acute phase of infection. Negative results do not preclude SARS-CoV-2 infection, do not rule out co-infections with other pathogens, and should not be used as the sole basis for treatment or other patient management decisions. Negative results must be combined with clinical observations, patient history, and epidemiological information. The expected result is Negative.  Fact Sheet for Patients: SugarRoll.be  Fact Sheet for Healthcare Providers: https://www.woods-.com/  This test is not yet approved or cleared by the [place] FDA and  has been authorized for detection and/or diagnosis of SARS-CoV-2 by FDA under an Emergency Use Authorization (EUA). This EUA will remain  in effect (meaning this test  can be used) for the duration of the COVID-19 declaration under Se ction 564(b)(1) of the Act, 21 U.S.C. section 360bbb-3(b)(1), unless the authorization is terminated or revoked sooner.  Performed at [facility], [place] [address], [place], [place] [address]   SARS Coronavirus 2 by RT PCR (hospital order, performed in [facility] hospital lab) Nasopharyngeal Nasopharyngeal Swab     Status: None   Collection Time: 02/24/20 10:55 PM   Specimen: Nasopharyngeal Swab  Result Value Ref Range Status   SARS Coronavirus 2 NEGATIVE NEGATIVE Final    Comment: (NOTE) SARS-CoV-2 target nucleic acids are NOT DETECTED.  The SARS-CoV-2 RNA is generally detectable in upper and lower respiratory specimens during the acute phase of infection. The lowest concentration of SARS-CoV-2 viral copies this assay can detect is 250 copies / mL. A negative result does not preclude SARS-CoV-2 infection and should not be used as the sole basis for treatment or other patient management decisions.  A negative result may occur with improper specimen collection / handling, submission of specimen other than nasopharyngeal swab, presence of viral mutation(s) within the areas targeted by this assay, and inadequate number of viral copies (<250 copies / mL). A negative result must be combined with clinical observations, patient history, and epidemiological information.  Fact Sheet for Patients:   StrictlyIdeas.no  Fact Sheet for Healthcare Providers: BankingDealers.co.za  This test is not yet approved or  cleared by the [place] FDA and has been authorized for detection and/or diagnosis of SARS-CoV-2 by FDA under an Emergency Use Authorization (EUA).  This EUA will remain in effect (meaning this test can be used) for the duration of the COVID-19 declaration under Section 564(b)(1) of the Act, 21 U.S.C. section 360bbb-3(b)(1), unless the authorization is  terminated or revoked sooner.  Performed at [facility], [place] [address] [place], [place] [address]          Radiology Studies: US RENAL  Result Date: 02/26/2020 CLINICAL DATA:  Acute kidney injury EXAM: RENAL / URINARY TRACT ULTRASOUND COMPLETE COMPARISON:  CT 01/22/2020 FINDINGS: Right Kidney: Renal measurements: 8 x 5.2 x 4.4 cm = volume: 96.5 mL. Renal cortical thinning. No hydronephrosis or mass. Left Kidney: Renal measurements: 8.8 x 5.1 x 5.2 cm = volume: 120.8 mL. Renal cortical thinning. No hydronephrosis. Cyst in the midpole measuring 2.4 cm. Bladder: Not well visualized and likely empty. Other: None. IMPRESSION: 1. Renal cortical thinning suggesting mild atrophy. No hydronephrosis. 2. Cyst in the mid left kidney Electronically Signed   By: Donavan Foil M.D.   On: 02/26/2020 17:33        Scheduled Meds: . amiodarone  200 mg Oral Daily  . brimonidine  1 drop Right Eye TID with meals  . brinzolamide  1 drop Right  Eye TID  . furosemide  80 mg Intravenous Q6H  . heparin  5,000 Units Subcutaneous Q8H  . latanoprost  1 drop Both Eyes QHS  . medroxyPROGESTERone  20 mg Oral Daily  . metolazone  2.5 mg Oral Daily  . mometasone-formoterol  2 puff Inhalation BID  . sodium chloride flush  3 mL Intravenous Q12H  . sodium zirconium cyclosilicate  10 g Oral TID   Continuous Infusions: . sodium chloride       LOS: 4 days     Georgette Shell, MD  02/28/2020, 3:56 PM

## 2020-02-28 NOTE — Progress Notes (Signed)
[facility] ROUNDING NOTE   Subjective:   79 year old lady diastolic heart failure atrial fibrillation on amiodarone no anticoagulation secondary to uterine bleeding chronic kidney disease baseline serum creatinine 1.5 to 2 mg/dL as of November 2020.  Recent hospitalization for volume overload 02/03/2020 discharged for 02/14/2020.  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  Evaluation by nephrology believe this was cardiorenal syndrome no identified nephrotoxins.  Discharge dose of Lasix 40 mg twice daily presented with  shortness of breath and worsening renal function.  Blood pressure 108/59 pulse 78 temperature 98.1 O2 sats 94% room air  Sodium 135 potassium 5.2 chloride 102 CO2 24 BUN 61 creatinine 6.35 glucose 92 calcium 8.5.  Last hemoglobin 12.9  ANA negative anti-GBM negative ANCA negative complements C3 slightly low 66 C4 normal kappa lambda light chain ratio 1.69 slightly increased.  No M spike on SPEP  Urine output 550 cc 02/27/2020 weight decreased 115.1 kg  Amiodarone 200 mg daily  Lasix 80 mg every 6 hours.  Provera 20 mg daily, metolazone 2.5 mg daily.   Objective:  Vital signs in last 24 hours:  Temp:  [97.8 F (36.6 C)-98.1 F (36.7 C)] 98.1 F (36.7 C) (06/18 0404) Pulse Rate:  [76-85] 81 (06/18 0404) Resp:  [20] 20 (06/18 0404) BP: (84-123)/(41-59) 108/59 (06/18 0635) SpO2:  [92 %-94 %] 94 % (06/18 0404) Weight:  [115.1 kg] 115.1 kg (06/18 0404)  Weight change: -1.522 kg Filed Weights   02/26/20 0336 02/27/20 0411 02/28/20 0404  Weight: 116.2 kg 116.6 kg 115.1 kg    Intake/Output: I/O last 3 completed shifts: In: 1060 [P.O.:1060] Out: 1200 [Urine:850; Stool:350]   Intake/Output this shift:  No intake/output data recorded.  General: Weak no acute distress HEENT: Normal Eyes: Pupils round equal reactive no  icterus or pallor Neck: Supple slightly elevated JVP no lymphadenopathy Heart: Regular rate and rhythm faint systolic murmur Lungs: Decreased air entry with occasional wheezes rales Abdomen: Soft nontender bowel sounds present no hepatosplenomegaly Extremities: No cyanosis clubbing 1+ pitting edema bilateral Skin: Warm well perfused Neuro: Grossly intact   Basic Metabolic Panel: Recent Labs  Lab 02/24/20 1945 02/24/20 1945 02/24/20 1956 02/26/20 0411 02/27/20 0337 02/28/20 0518  NA 137  --  138 137 136 135  K 4.3  --  4.2 4.6 4.5 5.2*  CL 103  --  106 103 103 102  CO2 22  --   --  22 24 24   GLUCOSE 98  --  99 91 97 92  BUN 46*  --  47* 53* 57* 61*  CREATININE 5.20*  --  5.60* 5.73* 5.98* 6.35*  CALCIUM 8.8*   < >  --  8.5* 8.5* 8.5*   < > = values in this interval not displayed.    Liver Function Tests: Recent Labs  Lab 02/24/20 1945  AST 27  ALT 19  ALKPHOS 47  BILITOT 1.4*  PROT 8.1  ALBUMIN 3.1*   No results for input(s): LIPASE, AMYLASE in the last 168 hours. No results for input(s): AMMONIA in the last 168 hours.  CBC: Recent Labs  Lab 02/24/20 1945 02/24/20 1956  WBC 9.8  --   NEUTROABS 6.0  --   HGB 11.2* 12.9  HCT 36.6 38.0  MCV 94.6  --   PLT 157  --     Cardiac Enzymes: No results for input(s): CKTOTAL, CKMB, CKMBINDEX, TROPONINI in the last 168 hours.  BNP:  Invalid input(s): POCBNP  CBG: Recent Labs  Lab 02/27/20 1618 02/27/20 2135  GLUCAP 120* 40*    Microbiology: Results for orders placed or performed during the hospital encounter of 02/24/20  SARS Coronavirus 2 by RT PCR (hospital order, performed in [facility] hospital lab) Nasopharyngeal Nasopharyngeal Swab     Status: None   Collection Time: 02/24/20 10:55 PM   Specimen: Nasopharyngeal Swab  Result Value Ref Range Status   SARS Coronavirus 2 NEGATIVE NEGATIVE Final    Comment: (NOTE) SARS-CoV-2 target nucleic acids are NOT DETECTED.  The SARS-CoV-2 RNA is generally  detectable in upper and lower respiratory specimens during the acute phase of infection. The lowest concentration of SARS-CoV-2 viral copies this assay can detect is 250 copies / mL. A negative result does not preclude SARS-CoV-2 infection and should not be used as the sole basis for treatment or other patient management decisions.  A negative result may occur with improper specimen collection / handling, submission of specimen other than nasopharyngeal swab, presence of viral mutation(s) within the areas targeted by this assay, and inadequate number of viral copies (<250 copies / mL). A negative result must be combined with clinical observations, patient history, and epidemiological information.  Fact Sheet for Patients:   StrictlyIdeas.no  Fact Sheet for Healthcare Providers: BankingDealers.co.za  This test is not yet approved or  cleared by the [place] FDA and has been authorized for detection and/or diagnosis of SARS-CoV-2 by FDA under an Emergency Use Authorization (EUA).  This EUA will remain in effect (meaning this test can be used) for the duration of the COVID-19 declaration under Section 564(b)(1) of the Act, 21 U.S.C. section 360bbb-3(b)(1), unless the authorization is terminated or revoked sooner.  Performed at [facility], [place] [address], [place], [place] [address]     Coagulation Studies: No results for input(s): LABPROT, INR in the last 72 hours.  Urinalysis: No results for input(s): COLORURINE, LABSPEC, PHURINE, GLUCOSEU, HGBUR, BILIRUBINUR, KETONESUR, PROTEINUR, UROBILINOGEN, NITRITE, LEUKOCYTESUR in the last 72 hours.  Invalid input(s): APPERANCEUR    Imaging: US RENAL  Result Date: 02/26/2020 CLINICAL DATA:  Acute kidney injury EXAM: RENAL / URINARY TRACT ULTRASOUND COMPLETE COMPARISON:  CT 01/22/2020 FINDINGS: Right Kidney: Renal measurements: 8 x 5.2 x 4.4 cm = volume: 96.5 mL. Renal cortical  thinning. No hydronephrosis or mass. Left Kidney: Renal measurements: 8.8 x 5.1 x 5.2 cm = volume: 120.8 mL. Renal cortical thinning. No hydronephrosis. Cyst in the midpole measuring 2.4 cm. Bladder: Not well visualized and likely empty. Other: None. IMPRESSION: 1. Renal cortical thinning suggesting mild atrophy. No hydronephrosis. 2. Cyst in the mid left kidney Electronically Signed   By: Donavan Foil M.D.   On: 02/26/2020 17:33   DG HIPS BILAT WITH PELVIS 2V  Result Date: 02/26/2020 CLINICAL DATA:  Bilateral hip pain for 1 day EXAM: DG HIP (WITH OR WITHOUT PELVIS) 2V BILAT COMPARISON:  01/22/2020 FINDINGS: Frontal and frogleg lateral views of the bilateral hips are obtained. There is bilateral hip osteoarthritis, right greater than left. No fracture, subluxation, or dislocation. Postsurgical changes at L4-5. Prominent spondylosis at the lumbosacral junction. The remainder of the bony pelvis is unremarkable. IMPRESSION: 1. Stable bilateral hip osteoarthritis, right greater than left. 2. No acute bony abnormality. Electronically Signed   By: Randa Ngo M.D.   On: 02/26/2020 15:48   VAS [place] UPPER EXTREMITY VENOUS DUPLEX  Result Date: 02/26/2020 UPPER VENOUS STUDY  Indications: Swelling Comparison Study: No prior study Performing Technologist: Sharyn Lull  Simonetti MHA, RDMS, RVT, RDCS  Examination Guidelines: A complete evaluation includes B-mode imaging, spectral Doppler, color Doppler, and power Doppler as needed of all accessible portions of each vessel. Bilateral testing is considered an integral part of a complete examination. Limited examinations for reoccurring indications may be performed as noted.  Right Findings: +----------+------------+---------+-----------+----------+--------------+ RIGHT     CompressiblePhasicitySpontaneousProperties   Summary     +----------+------------+---------+-----------+----------+--------------+ IJV         Partial   Pulsatile    Yes                              +----------+------------+---------+-----------+----------+--------------+ Subclavian    Full    Pulsatile    Yes                             +----------+------------+---------+-----------+----------+--------------+ Axillary      Full    Pulsatile    Yes                             +----------+------------+---------+-----------+----------+--------------+ Brachial      Full    Pulsatile    Yes                             +----------+------------+---------+-----------+----------+--------------+ Radial        Full                                                 +----------+------------+---------+-----------+----------+--------------+ Ulnar         Full                                                 +----------+------------+---------+-----------+----------+--------------+ Cephalic      Full                                                 +----------+------------+---------+-----------+----------+--------------+ Basilic                                             Not visualized +----------+------------+---------+-----------+----------+--------------+  Left Findings: +----------+------------+---------+-----------+----------+-------+ LEFT      CompressiblePhasicitySpontaneousPropertiesSummary +----------+------------+---------+-----------+----------+-------+ Subclavian               Yes       Yes                      +----------+------------+---------+-----------+----------+-------+  Summary:  Right: No evidence of deep vein thrombosis in the upper extremity. No evidence of superficial vein thrombosis in the upper extremity.  Left: No evidence of thrombosis in the subclavian.  *See table(s) above for measurements and observations.  Diagnosing physician: Harold Barban MD Electronically signed by Harold Barban MD on 02/26/2020 at 11:21:11 PM.    Final      Medications:   . sodium chloride     .  amiodarone  200 mg Oral Daily  . brimonidine  1 drop  Right Eye TID with meals  . brinzolamide  1 drop Right Eye TID  . furosemide  80 mg Intravenous Q6H  . heparin  5,000 Units Subcutaneous Q8H  . latanoprost  1 drop Both Eyes QHS  . medroxyPROGESTERone  20 mg Oral Daily  . metolazone  2.5 mg Oral Daily  . mometasone-formoterol  2 puff Inhalation BID  . sodium chloride flush  3 mL Intravenous Q12H   sodium chloride, acetaminophen, albuterol, ondansetron (ZOFRAN) IV, sodium chloride flush, traMADol  Assessment/ Plan:   Acute kidney injury on baseline of chronic kidney disease.  Admitted with volume overload.  Protein creatinine ratio 0.5.  Urine sodium less than 10.  Serological evaluation unremarkable.  Renal ultrasound no hydronephrosis 02/26/2020.  Question would be whether to proceed with dialysis.  Appreciate palliative medicine involvement.  Her systolic hypotension may be a limiting factor.  Hypertension/volume.  Increased Lasix to 80 mg every 6 hours, metolazone 2.5 mg daily to try to improve diuresis.  Chest x-ray was positive for pulmonary vascular congestion.  Blood pressure low was unable to give additional doses of Lasix due to hypotension and systolic blood pressure less than 90 mmHg  Congestive heart failure diastolic dysfunction serological evaluation unremarkable.  Atrial fibrillation continues on amiodarone with no anticoagulation secondary to history of uterine bleed.  Hyperkalemia we will add Lokelma 10 g 3 times daily  Disposition palliative medicine involved to meet with family.  Still ambivalent about proceeding with dialysis treatment.   LOS: [place] @TODAY @7 :11 AM

## 2020-02-28 NOTE — Progress Notes (Signed)
Daily Progress Note   Patient Name: Jackie Parker       Date: 02/28/2020 DOB: April 04, 1941  Age: 79 y.o. MRN#: [id number] Attending Physician: Georgette Shell, MD Primary Care Physician: [name], MD Admit Date: 02/24/2020  Reason for Consultation/Follow-up: Establishing goals of care  Subjective: Patient sitting up in the recliner watching television. Daughter, Joseph Art is at the bedside. Ms. Bluett denies pain or shortness of breath at rest. Does endorse some shortness of breath with increased exertion at times.   I reviewed goals of care discussion patient had with my colleague on yesterday. Updates provided to daughter, Joseph Art. Renee apologetic for missing meeting yesterday sharing she had an urgent event that caused her absence.   Continued discussions today including Renee. We discussed patient's co-morbidities and disease trajectory. Discussed in details patient and daughter's understanding and decisions regarding dialysis.   Ms. Glade states she is not certain she would want to undergo dialysis knowing she may not tolerate treatment and that she has underlying cardiac concerns that she is aware are at the maximum level of treatment available. Daughter verbalized understanding. Ms. Pozo shares "I don't want to start dialysis and die regardless but with worst problems or feeling worst than I did before it started." Support given. She speaks on her importance of her quality of life and how she would like to spend the time that she has left. Therapeutic listening provided.   She shares that she has 2 sons that she has not discussed the severity of her health with in detail and wishes to speak with them before she makes her final decisions. Renee verbalized agreement. Patient reports she has just told Joseph Art today about how she was feeling and although she knows it is her decisions she wants to have the support of her children.   I created space and opportunity for patient to share  her feelings. I also used this opportunity to discuss her full code care status in consideration to her current illness and co-morbidities. Patient initially reports she would not want to undergo CPR or to be placed on life-support knowing her current condition and no meaningful chance of survival or recovery. However ultimately she again shares she has to speak with all of her children and make sure they are onboard with her decisions and provide the support she needs.   I encouraged patient and daughter to continue with important discussions with family and make the best decisions for patient. Encourage Ms. Thoreson to make decisions based on her wishes for herself and allow her family to understand what is most important to and for her. She and daughter verbalized understanding and appreciation.    Length of Stay: 4 days  Vital Signs: BP (!) 120/52 (BP Location: Left Arm)   Pulse 83   Temp (!) 97.5 F (36.4 C) (Oral)   Resp 20   Ht 5' 2"  (1.575 m)   Wt 115.1 kg   SpO2 97%   BMI 46.40 kg/m  SpO2: SpO2: 97 % O2 Device: O2 Device: Room Air O2 Flow Rate:    Intake/output summary:   Intake/Output Summary (Last 24 hours) at 02/28/2020 1448 Last data filed at 02/28/2020 0834 Gross per 24 hour  Intake 720 ml  Output 450 ml  Net 270 ml   LBM:   Baseline Weight: Weight: 114.6 kg Most recent weight: Weight: 115.1 kg  Physical Exam: -awake, A&O x3, chronically-ill appearing -normal breathing pattern  -follows commands  Palliative Care Assessment & Plan    Code Status:  Full code   Goals of Care:  Full Code-as confirmed by patient. She states she would not want to undergo life sustaining measures such as CPR or intubation however, she is not prepared to make any final decisions until she has a chance to speak with all of her children with hopes of gaining their support. Patient states her daughter, Joseph Art is her documented HCPOA.   Continue with current plan of  care-patient expressed she does not "think" she would want to undergo dialysis and further chance the risk of health decline. She states she would rather continue with medical management for as long as she can until her last moments come. However, she again states she cannot make final decisions in fear of "something happening" until she is able to speak with her 2 sons in addition to further discussions with her daughter, Joseph Art. She also states she would like to hear each provider's updates and opinions.   Encouraged patient and daughter to continue important discussions.   PMT will continue to support and follow as needed.    Prognosis: Unable to determine (Guarded)  Discharge Planning: To Be Determined  Thank you for allowing the Palliative Medicine Team to assist in the care of this patient.  Time Total: 55 min.   Visit consisted of counseling and education dealing with the complex and emotionally intense issues of symptom management and palliative care in the setting of serious and potentially life-threatening illness.Greater than 50%  of this time was spent counseling and coordinating care related to the above assessment and plan.  Alda Lea, AGPCNP-BC  Palliative Medicine Team [phone]

## 2020-02-28 NOTE — Progress Notes (Signed)
Patient BP 84/41.  Patient is asymptomatic.  MD notified of BP and of holding lasix.

## 2020-02-28 NOTE — Progress Notes (Signed)
PT Cancellation Note  Patient Details Name: Dameka Younker MRN: [id number] DOB: Feb 21, 1941   Cancelled Treatment:    Reason Eval/Treat Not Completed: Other (comment) (2attempts.  NP in room. Will check back as able. )   Denice Paradise 02/28/2020, 11:35 AM Jazlynne Milliner W,PT Acute Rehabilitation Services Pager:  [phone]  Office:  [phone]

## 2020-02-29 LAB — BASIC METABOLIC PANEL
Anion gap: 10 (ref 5–15)
BUN: 63 mg/dL — ABNORMAL HIGH (ref 8–23)
CO2: 24 mmol/L (ref 22–32)
Calcium: 8.3 mg/dL — ABNORMAL LOW (ref 8.9–10.3)
Chloride: 103 mmol/L (ref 98–111)
Creatinine, Ser: 6.44 mg/dL — ABNORMAL HIGH (ref 0.44–1.00)
GFR calc Af Amer: 7 mL/min — ABNORMAL LOW (ref >60)
GFR calc non Af Amer: 6 mL/min — ABNORMAL LOW (ref >60)
Glucose, Bld: 85 mg/dL (ref 70–99)
Potassium: 3.9 mmol/L (ref 3.5–5.1)
Sodium: 137 mmol/L (ref 135–145)

## 2020-02-29 NOTE — Progress Notes (Signed)
[facility] ROUNDING NOTE   Subjective:   79 year old lady diastolic heart failure atrial fibrillation on amiodarone no anticoagulation secondary to uterine bleeding chronic kidney disease baseline serum creatinine 1.5 to 2 mg/dL as of November 2020.  Recent hospitalization for volume overload 02/03/2020 discharged for 02/14/2020.  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  Evaluation by nephrology believe this was cardiorenal syndrome no identified nephrotoxins.  Discharge dose of Lasix 40 mg twice daily presented with  shortness of breath and worsening renal function.  Blood pressure 104/53 pulse 84 temperature 98.7 O2 sats 95% room air  Labs pending 02/29/2020 as of 7:58 AM  ANA negative anti-GBM negative ANCA negative complements C3 slightly low 66 C4 normal kappa lambda light chain ratio 1.69 slightly increased.  No M spike on SPEP  Urine output 450 cc 02/29/2020 weight 115.8 kg increased from admission weight 114.6 kg  Amiodarone 200 mg daily  Lasix 80 mg every 6 hours.  Provera 20 mg daily, metolazone 2.5 mg daily.   Objective:  Vital signs in last 24 hours:  Temp:  [97.5 F (36.4 C)-98.7 F (37.1 C)] 98.7 F (37.1 C) (06/19 0254) Pulse Rate:  [81-84] 84 (06/19 0254) Resp:  [18-20] 18 (06/19 0254) BP: (95-120)/(48-53) 104/53 (06/19 0254) SpO2:  [94 %-98 %] 95 % (06/19 0747) Weight:  [115.8 kg] 115.8 kg (06/19 0200)  Weight change: 0.726 kg Filed Weights   02/27/20 0411 02/28/20 0404 02/29/20 0200  Weight: 116.6 kg 115.1 kg 115.8 kg    Intake/Output: I/O last 3 completed shifts: In: 1440 [P.O.:1440] Out: 1300 [Urine:1250; Stool:50]   Intake/Output this shift:  No intake/output data recorded.  General: Weak no acute distress HEENT: Normal Eyes: Pupils round equal reactive no icterus or pallor Neck: Supple slightly elevated  JVP no lymphadenopathy Heart: Regular rate and rhythm faint systolic murmur Lungs: Decreased air entry with occasional wheezes rales Abdomen: Soft nontender bowel sounds present no hepatosplenomegaly Extremities: No cyanosis clubbing 1+ pitting edema bilateral Skin: Warm well perfused Neuro: Grossly intact   Basic Metabolic Panel: Recent Labs  Lab 02/24/20 1945 02/24/20 1945 02/24/20 1956 02/26/20 0411 02/27/20 0337 02/28/20 0518  NA 137  --  138 137 136 135  K 4.3  --  4.2 4.6 4.5 5.2*  CL 103  --  106 103 103 102  CO2 22  --   --  22 24 24   GLUCOSE 98  --  99 91 97 92  BUN 46*  --  47* 53* 57* 61*  CREATININE 5.20*  --  5.60* 5.73* 5.98* 6.35*  CALCIUM 8.8*   < >  --  8.5* 8.5* 8.5*   < > = values in this interval not displayed.    Liver Function Tests: Recent Labs  Lab 02/24/20 1945  AST 27  ALT 19  ALKPHOS 47  BILITOT 1.4*  PROT 8.1  ALBUMIN 3.1*   No results for input(s): LIPASE, AMYLASE in the last 168 hours. No results for input(s): AMMONIA in the last 168 hours.  CBC: Recent Labs  Lab 02/24/20 1945 02/24/20 1956  WBC 9.8  --   NEUTROABS 6.0  --   HGB 11.2* 12.9  HCT 36.6 38.0  MCV 94.6  --   PLT 157  --     Cardiac Enzymes: No results for input(s): CKTOTAL, CKMB, CKMBINDEX, TROPONINI in the last 168 hours.  BNP: Invalid input(s): POCBNP  CBG: Recent Labs  Lab 02/27/20 1618 02/27/20 2135  GLUCAP 120* 123*    Microbiology: Results for orders placed or performed during the hospital encounter of 02/24/20  SARS Coronavirus 2 by RT PCR (hospital order, performed in [facility] hospital lab) Nasopharyngeal Nasopharyngeal Swab     Status: None   Collection Time: 02/24/20 10:55 PM   Specimen: Nasopharyngeal Swab  Result Value Ref Range Status   SARS Coronavirus 2 NEGATIVE NEGATIVE Final    Comment: (NOTE) SARS-CoV-2 target nucleic acids are NOT DETECTED.  The SARS-CoV-2 RNA is generally detectable in upper and lower respiratory specimens  during the acute phase of infection. The lowest concentration of SARS-CoV-2 viral copies this assay can detect is 250 copies / mL. A negative result does not preclude SARS-CoV-2 infection and should not be used as the sole basis for treatment or other patient management decisions.  A negative result may occur with improper specimen collection / handling, submission of specimen other than nasopharyngeal swab, presence of viral mutation(s) within the areas targeted by this assay, and inadequate number of viral copies (<250 copies / mL). A negative result must be combined with clinical observations, patient history, and epidemiological information.  Fact Sheet for Patients:   StrictlyIdeas.no  Fact Sheet for Healthcare Providers: BankingDealers.co.za  This test is not yet approved or  cleared by the [place] FDA and has been authorized for detection and/or diagnosis of SARS-CoV-2 by FDA under an Emergency Use Authorization (EUA).  This EUA will remain in effect (meaning this test can be used) for the duration of the COVID-19 declaration under Section 564(b)(1) of the Act, 21 U.S.C. section 360bbb-3(b)(1), unless the authorization is terminated or revoked sooner.  Performed at [facility], [place] [address], [place], [place] [address]     Coagulation Studies: No results for input(s): LABPROT, INR in the last 72 hours.  Urinalysis: No results for input(s): COLORURINE, LABSPEC, PHURINE, GLUCOSEU, HGBUR, BILIRUBINUR, KETONESUR, PROTEINUR, UROBILINOGEN, NITRITE, LEUKOCYTESUR in the last 72 hours.  Invalid input(s): APPERANCEUR    Imaging: No results found.   Medications:   . sodium chloride     . amiodarone  200 mg Oral Daily  . brimonidine  1 drop Right Eye TID with meals  . brinzolamide  1 drop Right Eye TID  . furosemide  80 mg Intravenous Q6H  . heparin  5,000 Units Subcutaneous Q8H  . latanoprost  1 drop Both Eyes  QHS  . medroxyPROGESTERone  20 mg Oral Daily  . metolazone  2.5 mg Oral Daily  . mometasone-formoterol  2 puff Inhalation BID  . sodium chloride flush  3 mL Intravenous Q12H  . sodium zirconium cyclosilicate  10 g Oral TID   sodium chloride, acetaminophen, albuterol, ondansetron (ZOFRAN) IV, sodium chloride flush, traMADol  Assessment/ Plan:   Acute kidney injury on baseline of chronic kidney disease.  Admitted with volume overload.  Protein creatinine ratio 0.5.  Urine sodium less than 10.  Serological evaluation unremarkable.  Renal ultrasound no hydronephrosis 02/26/2020.  Question would be whether to proceed with dialysis.  Appreciate palliative medicine involvement.  Her systolic hypotension may be a limiting factor.  Hypertension/volume.  Increased Lasix to 80 mg every 6 hours, metolazone 2.5 mg daily to try to improve diuresis.  Chest x-ray was positive for pulmonary vascular congestion.  Blood pressure appears a little improved this morning.  This may be limiting factor of whether she will tolerate hemodialysis.  Patient still ambivalent about proceeding with dialysis.  Congestive heart failure diastolic dysfunction  serological evaluation unremarkable.  Atrial fibrillation continues on amiodarone with no anticoagulation secondary to history of uterine bleed.  Hyperkalemia we will add Lokelma 10 g 3 times daily.  Labs pending a.m. 02/29/2020  Disposition palliative medicine involved to meet with family.  Still ambivalent about proceeding with dialysis treatment.   LOS: [place] @TODAY @7 :57 AM

## 2020-02-29 NOTE — Progress Notes (Addendum)
PROGRESS NOTE    Jackie Parker  IRJ:[id number] DOB: 1941/08/12 DOA: 02/24/2020 PCP: [name], MD    Brief Narrative: 79 year old female with history of atrial fibrillation not on anticoagulation due to uterine bleeding, stage III CKD stage IIIb, chronic diastolic CHF admitted with acute on chronic CHF exacerbation. Patient was admitted to the hospital 02/14/2020 with the same she underwent right heart catheterization and echocardiogram at that time. She was adherent to her diuretics at home in spite of it patient developed shortness of breath and swelling. She also noticed new right arm swelling. She is admitted for acute on chronic diastolic CHF and AKI on CKD stage IIIb.   Assessment & Plan:   Principal Problem:   Acute on chronic diastolic CHF (congestive heart failure) (HCC) Active Problems:   Acute renal failure superimposed on stage 4 chronic kidney disease (HCC)   AF (paroxysmal atrial fibrillation) (HCC)   Acute kidney failure (HCC)   Acute on chronic congestive heart failure (HCC)   Goals of care, counseling/discussion   Advanced care planning/counseling discussion  #1 recurrent acute on chronic diastolic CHF-followed by cardiology and nephrology. Lasix dose increased to 3 times a day since urine output has not improved.Added Zaroxolyn 2.5 mg without much improvement. Echo with normal systolic function. Cardiac cath earlier this month moderate pulmonary hypertension PT consult pending. She remains fluid overloaded in spite of increasing Lasix and adding Zaroxolyn this is complicated by soft blood pressure. Appreciate palliative care input and there discussion with family today to decide on where to go from here. 91% on room air. Positive by 1 L Urine output 850 cc last 24 hours  #2 AKI on CKD stage IIIb to stage IV-her baseline creatinine is around 2.  Urine output last 24 hours 650 cc. At the time of admission her creatinine was 5.2. Creatinine continues  to trend up up to 6.44  from 6.35 from 5.98. At the time of discharge from the hospital after cardiac cath earlier this month her creatinine was 3.94. Renal ultrasoundno hydronephrosis renal cortical thinning suggesting mild atrophy. Cyst in the mid left kidney. Normal TSH.  #3 paroxysmal atrial fibrillation on amiodarone and diltiazem.Diltiazem stopped today due to soft blood pressure. Not on anticoagulation due to fibroid bleeding  #4 abnormal UA since patient not symptomatic not started on antibiotics.  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 No bleeds reported from overnight staff    Estimated body mass index is 46.69 kg/m as calculated from the following:   Height as of this encounter: 5' 2"  (1.575 m).   Weight as of this encounter: 115.8 kg.  DVT prophylaxis:Subcu heparin  code Status:Full code Family Communication:dw daughter disposition Plan:Status is: Inpatient  Dispo:  Patient From: Home Planned Disposition: To be determined Expected discharge date: 03/02/20 Medically stable for discharge: Nopatient with fluid overload and worsening renal function on IV diuretics   Consultants:  Cardiology and nephrology palliative care  Procedures:None Antimicrobialsnone   Subjective:  Patient is resting in bed.  She is still thinking about dialysis.  She wants to talk to her son on Monday.  Her son cannot be reached now as he is at the beach Objective: Vitals:   02/29/20 0200 02/29/20 0254 02/29/20 0747 02/29/20 1130  BP:  (!) 104/53  (!) 128/54  Pulse:  84  82  Resp:  18  20  Temp:  98.7  F (37.1 C)  97.8 F (36.6 C)  TempSrc:  Oral  Oral  SpO2:  94% 95% 91%  Weight: 115.8 kg     Height:        Intake/Output Summary (Last 24 hours) at  02/29/2020 1308 Last data filed at 02/29/2020 0900 Gross per 24 hour  Intake 960 ml  Output 450 ml  Net 510 ml   Filed Weights   02/27/20 0411 02/28/20 0404 02/29/20 0200  Weight: 116.6 kg 115.1 kg 115.8 kg    Examination:  General exam: Appears calm and comfortable  Respiratory system: Decreased breath sounds at the bases to auscultation. Respiratory effort normal. Cardiovascular system: S1 & S2 heard, RRR. No JVD, murmurs, rubs, gallops or clicks. No pedal edema. Gastrointestinal system: Abdomen is nondistended, soft and nontender. No organomegaly or masses felt. Normal bowel sounds heard. Central nervous system: Alert and oriented. No focal neurological deficits. Extremities: Pitting edema up to her upper thighs and pelvic area Skin: No rashes, lesions or ulcers Psychiatry: Judgement and insight appear normal. Mood & affect appropriate.     Data Reviewed: I have personally reviewed following labs and imaging studies  CBC: Recent Labs  Lab 02/24/20 1945 02/24/20 1956  WBC 9.8  --   NEUTROABS 6.0  --   HGB 11.2* 12.9  HCT 36.6 38.0  MCV 94.6  --   PLT 157  --    Basic Metabolic Panel: Recent Labs  Lab 02/24/20 1945 02/24/20 1945 02/24/20 1956 02/26/20 0411 02/27/20 0337 02/28/20 0518 02/29/20 0717  NA 137   < > 138 137 136 135 137  K 4.3   < > 4.2 4.6 4.5 5.2* 3.9  CL 103   < > 106 103 103 102 103  CO2 22  --   --  22 24 24 24   GLUCOSE 98   < > 99 91 97 92 85  BUN 46*   < > 47* 53* 57* 61* 63*  CREATININE 5.20*   < > 5.60* 5.73* 5.98* 6.35* 6.44*  CALCIUM 8.8*  --   --  8.5* 8.5* 8.5* 8.3*   < > = values in this interval not displayed.   GFR: Estimated Creatinine Clearance: 8.7 mL/min (A) (by C-G formula based on SCr of 6.44 mg/dL (H)). Liver Function Tests: Recent Labs  Lab 02/24/20 1945  AST 27  ALT 19  ALKPHOS 47  BILITOT 1.4*  PROT 8.1  ALBUMIN 3.1*   No results for input(s): LIPASE, AMYLASE in the last 168 hours. No results for input(s):  AMMONIA in the last 168 hours. Coagulation Profile: No results for input(s): INR, PROTIME in the last 168 hours. Cardiac Enzymes: No results for input(s): CKTOTAL, CKMB, CKMBINDEX, TROPONINI in the last 168 hours. BNP (last 3 results) Recent Labs    12/02/19 1224  PROBNP 779*   HbA1C: No results for input(s): HGBA1C in the last 72 hours. CBG: Recent Labs  Lab 02/27/20 1618 02/27/20 2135  GLUCAP 120* 123*   Lipid Profile: No results for input(s): CHOL, HDL, LDLCALC, TRIG, CHOLHDL, LDLDIRECT in the last 72 hours. Thyroid Function Tests: No results for input(s): TSH, T4TOTAL, FREET4, T3FREE, THYROIDAB in the last 72 hours. Anemia Panel: No results for input(s): VITAMINB12, FOLATE, FERRITIN, TIBC, IRON, RETICCTPCT in the last 72 hours. Sepsis Labs: No results for input(s): PROCALCITON, LATICACIDVEN in the last 168 hours.  Recent Results (from the past 240 hour(s))  SARS CORONAVIRUS 2 (TAT 6-24 HRS) Nasopharyngeal Nasopharyngeal Swab     Status: None  Collection Time: 02/22/20 12:02 PM   Specimen: Nasopharyngeal Swab  Result Value Ref Range Status   SARS Coronavirus 2 NEGATIVE NEGATIVE Final    Comment: (NOTE) SARS-CoV-2 target nucleic acids are NOT DETECTED.  The SARS-CoV-2 RNA is generally detectable in upper and lower respiratory specimens during the acute phase of infection. Negative results do not preclude SARS-CoV-2 infection, do not rule out co-infections with other pathogens, and should not be used as the sole basis for treatment or other patient management decisions. Negative results must be combined with clinical observations, patient history, and epidemiological information. The expected result is Negative.  Fact Sheet for Patients: SugarRoll.be  Fact Sheet for Healthcare Providers: https://www.woods-.com/  This test is not yet approved or cleared by the [place] FDA and  has been authorized for detection  and/or diagnosis of SARS-CoV-2 by FDA under an Emergency Use Authorization (EUA). This EUA will remain  in effect (meaning this test can be used) for the duration of the COVID-19 declaration under Se ction 564(b)(1) of the Act, 21 U.S.C. section 360bbb-3(b)(1), unless the authorization is terminated or revoked sooner.  Performed at [facility], [place] [address], [place], [place] [address]   SARS Coronavirus 2 by RT PCR (hospital order, performed in [facility] hospital lab) Nasopharyngeal Nasopharyngeal Swab     Status: None   Collection Time: 02/24/20 10:55 PM   Specimen: Nasopharyngeal Swab  Result Value Ref Range Status   SARS Coronavirus 2 NEGATIVE NEGATIVE Final    Comment: (NOTE) SARS-CoV-2 target nucleic acids are NOT DETECTED.  The SARS-CoV-2 RNA is generally detectable in upper and lower respiratory specimens during the acute phase of infection. The lowest concentration of SARS-CoV-2 viral copies this assay can detect is 250 copies / mL. A negative result does not preclude SARS-CoV-2 infection and should not be used as the sole basis for treatment or other patient management decisions.  A negative result may occur with improper specimen collection / handling, submission of specimen other than nasopharyngeal swab, presence of viral mutation(s) within the areas targeted by this assay, and inadequate number of viral copies (<250 copies / mL). A negative result must be combined with clinical observations, patient history, and epidemiological information.  Fact Sheet for Patients:   StrictlyIdeas.no  Fact Sheet for Healthcare Providers: BankingDealers.co.za  This test is not yet approved or  cleared by the [place] FDA and has been authorized for detection and/or diagnosis of SARS-CoV-2 by FDA under an Emergency Use Authorization (EUA).  This EUA will remain in effect (meaning this test can be used) for the duration of  the COVID-19 declaration under Section 564(b)(1) of the Act, 21 U.S.C. section 360bbb-3(b)(1), unless the authorization is terminated or revoked sooner.  Performed at [facility], [place] [address] [place], [place] [address]          Radiology Studies: No results found.      Scheduled Meds: . amiodarone  200 mg Oral Daily  . brimonidine  1 drop Right Eye TID with meals  . brinzolamide  1 drop Right Eye TID  . furosemide  80 mg Intravenous Q6H  . heparin  5,000 Units Subcutaneous Q8H  . latanoprost  1 drop Both Eyes QHS  . medroxyPROGESTERone  20 mg Oral Daily  . metolazone  2.5 mg Oral Daily  . mometasone-formoterol  2 puff Inhalation BID  . sodium chloride flush  3 mL Intravenous Q12H  . sodium zirconium cyclosilicate  10 g Oral TID  Continuous Infusions: . sodium chloride       LOS: 5 days    Georgette Shell, MD 02/29/2020, 1:08 PM

## 2020-03-01 DIAGNOSIS — Z66 Do not resuscitate: Secondary | ICD-10-CM

## 2020-03-01 DIAGNOSIS — R0602 Shortness of breath: Secondary | ICD-10-CM

## 2020-03-01 LAB — RENAL FUNCTION PANEL
Albumin: 2.6 g/dL — ABNORMAL LOW (ref 3.5–5.0)
Anion gap: 11 (ref 5–15)
BUN: 66 mg/dL — ABNORMAL HIGH (ref 8–23)
CO2: 23 mmol/L (ref 22–32)
Calcium: 8.3 mg/dL — ABNORMAL LOW (ref 8.9–10.3)
Chloride: 101 mmol/L (ref 98–111)
Creatinine, Ser: 6.43 mg/dL — ABNORMAL HIGH (ref 0.44–1.00)
GFR calc Af Amer: 7 mL/min — ABNORMAL LOW (ref >60)
GFR calc non Af Amer: 6 mL/min — ABNORMAL LOW (ref >60)
Glucose, Bld: 89 mg/dL (ref 70–99)
Phosphorus: 5.7 mg/dL — ABNORMAL HIGH (ref 2.5–4.6)
Potassium: 3.3 mmol/L — ABNORMAL LOW (ref 3.5–5.1)
Sodium: 135 mmol/L (ref 135–145)

## 2020-03-01 NOTE — Progress Notes (Signed)
[facility] ROUNDING NOTE   Subjective:   79 year old lady diastolic heart failure atrial fibrillation on amiodarone no anticoagulation secondary to uterine bleeding chronic kidney disease baseline serum creatinine 1.5 to 2 mg/dL as of November 2020.  Recent hospitalization for volume overload 02/03/2020 discharged for 02/14/2020.  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  Evaluation by nephrology believe this was cardiorenal syndrome no identified nephrotoxins.  Discharge dose of Lasix 40 mg twice daily presented with  shortness of breath and worsening renal function.  Blood pressure 87-681 systolic /15-72 diastolic.  Pulse 77 temperature 98.4 O2 sats 96% 1 L nasal cannula  Urine output improved to 1.3 L 02/29/2020 appears to be in negative fluid balance.  Sodium 135 potassium 3.3 chloride 101 CO2 23 BUN 36 creatinine 6.43 glucose 89 calcium 8.3 phosphorus 5.7 albumin 2.6.  Hemoglobin 12.9  ANA negative anti-GBM negative ANCA negative complements C3 slightly low 66 C4 normal kappa lambda light chain ratio 1.69 slightly increased.  No M spike on SPEP  Urine output 450 cc 02/29/2020 weight 115.8 kg increased from admission weight 114.6 kg  Amiodarone 200 mg daily  Lasix 80 mg every 6 hours.  Provera 20 mg daily, metolazone 2.5 mg daily.   Objective:  Vital signs in last 24 hours:  Temp:  [97.8 F (36.6 C)-98.4 F (36.9 C)] 98.4 F (36.9 C) (06/20 0305) Pulse Rate:  [79-84] 79 (06/20 0305) Resp:  [15-20] 19 (06/20 0305) BP: (98-128)/(45-61) 109/52 (06/20 0305) SpO2:  [91 %-99 %] 96 % (06/20 0305) Weight:  [115.3 kg] 115.3 kg (06/20 0305)  Weight change: -0.499 kg Filed Weights   02/28/20 0404 02/29/20 0200 03/01/20 0305  Weight: 115.1 kg 115.8 kg 115.3 kg    Intake/Output: I/O last 3 completed shifts: In: 1080 [P.O.:1080] Out: 1500  [Urine:1500]   Intake/Output this shift:  No intake/output data recorded.  General: Weak no acute distress HEENT: Normal Eyes: Pupils round equal reactive no icterus or pallor Neck: Supple slightly elevated JVP no lymphadenopathy Heart: Regular rate and rhythm faint systolic murmur Lungs: Decreased air entry with occasional wheezes rales Abdomen: Soft nontender bowel sounds present no hepatosplenomegaly Extremities: No cyanosis clubbing 1+ pitting edema bilateral Skin: Warm well perfused Neuro: Grossly intact   Basic Metabolic Panel: Recent Labs  Lab 02/26/20 0411 02/26/20 0411 02/27/20 0337 02/27/20 0337 02/28/20 0518 02/29/20 0717 03/01/20 0600  NA 137  --  136  --  135 137 135  K 4.6  --  4.5  --  5.2* 3.9 3.3*  CL 103  --  103  --  102 103 101  CO2 22  --  24  --  24 24 23   GLUCOSE 91  --  97  --  92 85 89  BUN 53*  --  57*  --  61* 63* 66*  CREATININE 5.73*  --  5.98*  --  6.35* 6.44* 6.43*  CALCIUM 8.5*   < > 8.5*   < > 8.5* 8.3* 8.3*  PHOS  --   --   --   --   --   --  5.7*   < > = values in this interval not displayed.    Liver Function Tests: Recent Labs  Lab 02/24/20 1945 03/01/20 0600  AST 27  --   ALT 19  --   ALKPHOS 47  --   BILITOT 1.4*  --   PROT 8.1  --  ALBUMIN 3.1* 2.6*   No results for input(s): LIPASE, AMYLASE in the last 168 hours. No results for input(s): AMMONIA in the last 168 hours.  CBC: Recent Labs  Lab 02/24/20 1945 02/24/20 1956  WBC 9.8  --   NEUTROABS 6.0  --   HGB 11.2* 12.9  HCT 36.6 38.0  MCV 94.6  --   PLT 157  --     Cardiac Enzymes: No results for input(s): CKTOTAL, CKMB, CKMBINDEX, TROPONINI in the last 168 hours.  BNP: Invalid input(s): POCBNP  CBG: Recent Labs  Lab 02/27/20 1618 02/27/20 2135  GLUCAP 120* 84*    Microbiology: Results for orders placed or performed during the hospital encounter of 02/24/20  SARS Coronavirus 2 by RT PCR (hospital order, performed in [facility] hospital lab)  Nasopharyngeal Nasopharyngeal Swab     Status: None   Collection Time: 02/24/20 10:55 PM   Specimen: Nasopharyngeal Swab  Result Value Ref Range Status   SARS Coronavirus 2 NEGATIVE NEGATIVE Final    Comment: (NOTE) SARS-CoV-2 target nucleic acids are NOT DETECTED.  The SARS-CoV-2 RNA is generally detectable in upper and lower respiratory specimens during the acute phase of infection. The lowest concentration of SARS-CoV-2 viral copies this assay can detect is 250 copies / mL. A negative result does not preclude SARS-CoV-2 infection and should not be used as the sole basis for treatment or other patient management decisions.  A negative result may occur with improper specimen collection / handling, submission of specimen other than nasopharyngeal swab, presence of viral mutation(s) within the areas targeted by this assay, and inadequate number of viral copies (<250 copies / mL). A negative result must be combined with clinical observations, patient history, and epidemiological information.  Fact Sheet for Patients:   StrictlyIdeas.no  Fact Sheet for Healthcare Providers: BankingDealers.co.za  This test is not yet approved or  cleared by the [place] FDA and has been authorized for detection and/or diagnosis of SARS-CoV-2 by FDA under an Emergency Use Authorization (EUA).  This EUA will remain in effect (meaning this test can be used) for the duration of the COVID-19 declaration under Section 564(b)(1) of the Act, 21 U.S.C. section 360bbb-3(b)(1), unless the authorization is terminated or revoked sooner.  Performed at [facility], [place] [address] [place], [place] [address]     Coagulation Studies: No results for input(s): LABPROT, INR in the last 72 hours.  Urinalysis: No results for input(s): COLORURINE, LABSPEC, PHURINE, GLUCOSEU, HGBUR, BILIRUBINUR, KETONESUR, PROTEINUR, UROBILINOGEN, NITRITE, LEUKOCYTESUR in the  last 72 hours.  Invalid input(s): APPERANCEUR    Imaging: No results found.   Medications:   . sodium chloride     . amiodarone  200 mg Oral Daily  . brimonidine  1 drop Right Eye TID with meals  . brinzolamide  1 drop Right Eye TID  . furosemide  80 mg Intravenous Q6H  . heparin  5,000 Units Subcutaneous Q8H  . latanoprost  1 drop Both Eyes QHS  . medroxyPROGESTERone  20 mg Oral Daily  . metolazone  2.5 mg Oral Daily  . mometasone-formoterol  2 puff Inhalation BID  . sodium chloride flush  3 mL Intravenous Q12H  . sodium zirconium cyclosilicate  10 g Oral TID   sodium chloride, acetaminophen, albuterol, ondansetron (ZOFRAN) IV, sodium chloride flush, traMADol  Assessment/ Plan:   Acute kidney injury on baseline of chronic kidney disease.  Admitted with volume overload.  Protein creatinine ratio 0.5.  Urine sodium less than 10.  Serological evaluation unremarkable.  Renal ultrasound no hydronephrosis 02/26/2020.  Question would be whether to proceed with dialysis.  Appreciate palliative medicine involvement.  Her systolic hypotension may be a limiting factor.  Hypertension/volume.  Increased Lasix to 80 mg every 6 hours, metolazone 2.5 mg daily.  Appears to have had better diuresis 02/29/2020.  Chest x-ray was positive for pulmonary vascular congestion.  Blood pressure appears a little improved this morning.  This may be limiting factor of whether she will tolerate hemodialysis.  Patient still ambivalent about proceeding with dialysis.  Congestive heart failure diastolic dysfunction serological evaluation unremarkable.  Atrial fibrillation continues on amiodarone with no anticoagulation secondary to history of uterine bleed.  Hyperkalemia improved we will discontinue Lokelma  Disposition palliative medicine involved to meet with family.  Still ambivalent about proceeding with dialysis treatment.   LOS: [place] @TODAY @7 :21 AM

## 2020-03-01 NOTE — Progress Notes (Signed)
PROGRESS NOTE    Jackie Parker  JAS:[id number] DOB: [birth year] DOA: 02/24/2020 PCP: [name], MD  Brief Narrative:79 year old female with history of atrial fibrillation not on anticoagulation due to uterine bleeding, stage III CKD stage IIIb, chronic diastolic CHF admitted with acute on chronic CHF exacerbation. Patient was admitted to the hospital 02/14/2020 with the same she underwent right heart catheterization and echocardiogram at that time. She was adherent to her diuretics at home in spite of it patient developed shortness of breath and swelling. She also noticed new right arm swelling. She is admitted for acute on chronic diastolic CHF and AKI on CKD stage IIIb.  Assessment & Plan:   Principal Problem:   Acute on chronic diastolic CHF (congestive heart failure) (HCC) Active Problems:   Acute renal failure superimposed on stage 4 chronic kidney disease (HCC)   AF (paroxysmal atrial fibrillation) (HCC)   Acute kidney failure (HCC)   Acute on chronic congestive heart failure (HCC)   Goals of care, counseling/discussion   Advanced care planning/counseling discussion   #1 recurrent acute on chronic diastolic CHF-followed by cardiology and nephrology. Lasix dose increased to 3 times a day since urine output has not improved.Added Zaroxolyn 2.5 mg without much improvement. Echo with normal systolic function. Cardiac cath earlier this month moderate pulmonary hypertension She remains fluid overloaded in spite of increasing Lasix and adding Zaroxolyn this is complicated by soft blood pressure. Appreciate palliative care input and there discussion with family today to decide on where to go from here. Saturation 98% on 1 L 91% on room air. Positive by 365 cc down from yesterday 1 L Urine outpu 1050 from  850 cc last 24 hours  #2 AKI on CKD stage IIIb to stage IV-her baseline creatinine is around 2.Urine output last 24 hours 650 cc. At the time of admission her  creatinine was 5.2. Creatinine continues to trend up up to 6.43 from 6.35 from5.98. At the time of discharge from the hospital after cardiac cath earlier this month her creatinine was 3.94. Renal ultrasoundno hydronephrosis renal cortical thinning suggesting mild atrophy. Cyst in the mid left kidney. Normal TSH. Potassium 3.3 was on Lokelma which has been stopped today 03/01/2020.   #3 paroxysmal atrial fibrillation on amiodarone and diltiazem.Diltiazem stopped today due to soft blood pressure. Not on anticoagulation due to fibroid bleeding  #4 abnormal UA since patient not symptomatic not started on antibiotics.  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  #7 bleeding fibroids on medroxyprogesterone continue. No bleeds reported from overnight staff    Estimated body mass index is 46.49 kg/m as calculated from the following:   Height as of this encounter: 5' 2"  (1.575 m).   Weight as of this encounter: 115.3 kg.  DVT prophylaxis:Subcu heparin  code Status:Full code Family Communication:dw daughter disposition Plan:Status is: Inpatient  Dispo:  Patient From: Home Planned Disposition: To be determined Expected discharge date: 03/02/20 Medically stable for discharge: Nopatient with fluid overload and worsening renal function on IV diuretics   Consultants:  Cardiology and nephrology palliative care  Procedures:None Antimicrobialsnone     Subjective:  She is resting in bed she feels okay still thinking about whether to have dialysis or not she still wants to talk to her son tomorrow and decide on it. Objective: Vitals:   02/29/20 2052 03/01/20 0026 03/01/20 0305 03/01/20 0916  BP:  (!) 98/45 (!) 109/52 (!) 109/50  Pulse: 79 79 79 77  Resp: 18  19 20   Temp:   98.4 F (36.9  C)   TempSrc:   Oral   SpO2: 99%  96% 98%  Weight:   115.3 kg   Height:        Intake/Output Summary (Last 24 hours) at 03/01/2020 0928 Last data filed at 03/01/2020 0500 Gross per 24 hour  Intake 360 ml  Output 850 ml  Net -490 ml   Filed Weights   02/28/20 0404 02/29/20 0200 03/01/20 0305  Weight: 115.1 kg 115.8 kg 115.3 kg    Examination:  General exam: Appears calm and comfortable  Respiratory system: diminished at bases to auscultation. Respiratory effort normal. Cardiovascular system: S1 & S2 heard, RRR. No JVD, murmurs, rubs, gallops or clicks. No pedal edema. Gastrointestinal system: Abdomen is nondistended, soft and nontender. No organomegaly or masses felt. Normal bowel sounds heard. Central nervous system: Alert and oriented. No focal neurological deficits. Extremities: 2plus edema chronic venous stasis changes Skin: No rashes, lesions or ulcers Psychiatry: Judgement and insight appear normal. Mood & affect appropriate.     Data Reviewed: I have personally reviewed following labs and imaging studies  CBC: Recent Labs  Lab 02/24/20 1945 02/24/20 1956  WBC 9.8  --   NEUTROABS 6.0  --   HGB 11.2* 12.9  HCT 36.6 38.0  MCV 94.6  --   PLT 157  --    Basic Metabolic Panel: Recent Labs  Lab 02/26/20 0411 02/27/20 0337 02/28/20 0518 02/29/20 0717 03/01/20 0600  NA 137 136 135 137 135  K 4.6 4.5 5.2* 3.9 3.3*  CL 103 103 102 103 101  CO2 22 24 24 24 23   GLUCOSE 91 97 92 85 89  BUN 53* 57* 61* 63* 66*  CREATININE 5.73* 5.98* 6.35* 6.44* 6.43*  CALCIUM 8.5* 8.5* 8.5* 8.3* 8.3*  PHOS  --   --   --   --  5.7*   GFR: Estimated Creatinine Clearance: 8.7 mL/min (A) (by C-G formula based on SCr of 6.43 mg/dL (H)). Liver Function Tests: Recent Labs  Lab 02/24/20 1945 03/01/20 0600  AST 27  --   ALT 19  --   ALKPHOS 47  --   BILITOT 1.4*  --   PROT 8.1  --   ALBUMIN 3.1* 2.6*   No results for input(s): LIPASE, AMYLASE in the last 168 hours. No  results for input(s): AMMONIA in the last 168 hours. Coagulation Profile: No results for input(s): INR, PROTIME in the last 168 hours. Cardiac Enzymes: No results for input(s): CKTOTAL, CKMB, CKMBINDEX, TROPONINI in the last 168 hours. BNP (last 3 results) Recent Labs    12/02/19 1224  PROBNP 779*   HbA1C: No results for input(s): HGBA1C in the last 72 hours. CBG: Recent Labs  Lab 02/27/20 1618 02/27/20 2135  GLUCAP 120* 123*   Lipid Profile: No results for input(s): CHOL, HDL, LDLCALC, TRIG, CHOLHDL, LDLDIRECT in the last 72 hours. Thyroid Function Tests: No results for input(s): TSH, T4TOTAL, FREET4, T3FREE, THYROIDAB in the last 72 hours. Anemia Panel: No results for input(s): VITAMINB12, FOLATE, FERRITIN, TIBC, IRON, RETICCTPCT in the last 72 hours. Sepsis Labs: No results for input(s): PROCALCITON, LATICACIDVEN in the last 168 hours.  Recent Results (from the past 240 hour(s))  SARS CORONAVIRUS 2 (TAT 6-24 HRS) Nasopharyngeal Nasopharyngeal Swab     Status: None   Collection Time: 02/22/20 12:02 PM   Specimen: Nasopharyngeal Swab  Result Value Ref Range Status  SARS Coronavirus 2 NEGATIVE NEGATIVE Final    Comment: (NOTE) SARS-CoV-2 target nucleic acids are NOT DETECTED.  The SARS-CoV-2 RNA is generally detectable in upper and lower respiratory specimens during the acute phase of infection. Negative results do not preclude SARS-CoV-2 infection, do not rule out co-infections with other pathogens, and should not be used as the sole basis for treatment or other patient management decisions. Negative results must be combined with clinical observations, patient history, and epidemiological information. The expected result is Negative.  Fact Sheet for Patients: SugarRoll.be  Fact Sheet for Healthcare Providers: https://www.[employer].com/  This test is not yet approved or cleared by the [place] FDA and  has been  authorized for detection and/or diagnosis of SARS-CoV-2 by FDA under an Emergency Use Authorization (EUA). This EUA will remain  in effect (meaning this test can be used) for the duration of the COVID-19 declaration under Se ction 564(b)(1) of the Act, 21 U.S.C. section 360bbb-3(b)(1), unless the authorization is terminated or revoked sooner.  Performed at [facility], [place] [address] [place], [place] [address]   SARS Coronavirus 2 by RT PCR (hospital order, performed in [facility] hospital lab) Nasopharyngeal Nasopharyngeal Swab     Status: None   Collection Time: 02/24/20 10:55 PM   Specimen: Nasopharyngeal Swab  Result Value Ref Range Status   SARS Coronavirus 2 NEGATIVE NEGATIVE Final    Comment: (NOTE) SARS-CoV-2 target nucleic acids are NOT DETECTED.  The SARS-CoV-2 RNA is generally detectable in upper and lower respiratory specimens during the acute phase of infection. The lowest concentration of SARS-CoV-2 viral copies this assay can detect is 250 copies / mL. A negative result does not preclude SARS-CoV-2 infection and should not be used as the sole basis for treatment or other patient management decisions.  A negative result may occur with improper specimen collection / handling, submission of specimen other than nasopharyngeal swab, presence of viral mutation(s) within the areas targeted by this assay, and inadequate number of viral copies (<250 copies / mL). A negative result must be combined with clinical observations, patient history, and epidemiological information.  Fact Sheet for Patients:   StrictlyIdeas.no  Fact Sheet for Healthcare Providers: BankingDealers.co.za  This test is not yet approved or  cleared by the [place] FDA and has been authorized for detection and/or diagnosis of SARS-CoV-2 by FDA under an Emergency Use Authorization (EUA).  This EUA will remain in effect (meaning this test can be  used) for the duration of the COVID-19 declaration under Section 564(b)(1) of the Act, 21 U.S.C. section 360bbb-3(b)(1), unless the authorization is terminated or revoked sooner.  Performed at [facility], [place] [address], [place], [place] [address]          Radiology Studies: No results found.      Scheduled Meds: . amiodarone  200 mg Oral Daily  . brimonidine  1 drop Right Eye TID with meals  . brinzolamide  1 drop Right Eye TID  . furosemide  80 mg Intravenous Q6H  . heparin  5,000 Units Subcutaneous Q8H  . latanoprost  1 drop Both Eyes QHS  . medroxyPROGESTERone  20 mg Oral Daily  . metolazone  2.5 mg Oral Daily  . mometasone-formoterol  2 puff Inhalation BID  . sodium chloride flush  3 mL Intravenous Q12H   Continuous Infusions: . sodium chloride       LOS: 6 days     Georgette Shell, MD  03/01/2020, 9:28 AM

## 2020-03-01 NOTE — Progress Notes (Signed)
Daily Progress Note   Patient Name: Jackie Parker       Date: 03/01/2020 DOB: August 14, 1941  Age: 79 y.o. MRN#: [id number] Attending Physician: Jackie Shell, MD Primary Care Physician: Jackie Rakes, MD Admit Date: 02/24/2020  Reason for Consultation/Follow-up: Establishing goals of care  Subjective: Patient lying in bed watching television finishing up her lunch. No family is at the bedside. Denies pain. Does endorse some shortness of breath with extreme exertion. Is currently on room air with oxygen beside her.   Created space and opportunity for Jackie Parker to revisit goals of care and discussions that have occurred amongst her and her children. Jackie Parker reports she has spoken with her children and they have expressed to her that they will support her decisions no matter what she decides in terms of dialysis or not. She is somewhat tearful expressing she is grateful they will support her however, she was looking for them to say how they felt as to whether she should or should not consider dialysis. She reports she is still leaning towards not entertaining the thought of dialysis but again does not want her children to feel that she is not fighting. She defers to discussions with cardiology and continued medical management. Support given.   I discussed with her at length regarding her feelings towards not initiating dialysis and encouraged her that based on what she has said her children wants what best for her and for her to make the decisions based on what it is that she wants for herself. I encouraged her to make decisions that she would want and if she knows she is not interested in pursuing dialysis that is not "giving up" and it doesn't meant she is "not fighting". It means her quality of life is important and she knows what she would not want to go through. We discussed sometimes fighting looks different and can mean being kept comfortable, and making memories with family with  whatever time is left on earth. She verbalized understanding and expressed her appreciation.   Education provided to patient regarding her current illness and barriers to tolerating dialysis such as her hypotension and continued weakness. Jackie Parker verbalized understanding and acknowledged her awareness of these barriers again expressing her hesitancy for dialysis knowing it could potentially escalate her poor quality of life. Support given.   We discussed her current full code status again discussing her current illnesses/co-morbidities. She states that she also discussed this with her family and this was the one thing they agreed upon is that they would not want to have to see her on life support or for her to undergo that trauma. She states she would not want life-sustaining measures such as cpr, defibrillation, or intubation in an emergent event. She reports she would rather be kept comfortable and allowed a natural death understanding her time is limited at this point. Support given. I explained to patient based on her statement recommendations would be for a DNR/DNI. I explained in detail what DNR/DNI would look like. She verbalized understanding and again expressed wishes for DNR. Education provided that code status would be changed on her chart, an out of facility form would be completed to accompany her home, and the RN would be placing a bracelet on her while hospitalized to notify the staff of her wishes. Jackie Parker verbalized understanding and appreciation.   All questions answered and support given.   Length of Stay: 6 days  Vital Signs: BP (!) 112/50   Pulse 75  Temp 98.4 F (36.9 C) (Oral)   Resp 20   Ht 5' 2"  (1.575 m)   Wt 115.3 kg   SpO2 96%   BMI 46.49 kg/m  SpO2: SpO2: 96 % O2 Device: O2 Device: Room Air O2 Flow Rate: O2 Flow Rate (L/min): 1 L/min  Intake/output summary:   Intake/Output Summary (Last 24 hours) at 03/01/2020 1300 Last data filed at 03/01/2020 1000 Gross  per 24 hour  Intake 480 ml  Output 1100 ml  Net -620 ml   LBM:   Baseline Weight: Weight: 114.6 kg Most recent weight: Weight: 115.3 kg  Physical Exam: -alert and oriented, NAD -normal breathing pattern             Palliative Care Assessment & Plan    Code Status:  DNR   Goals of Care:  DNR/DNI-as requested by patient. Out of facility form completed and placed on chart.   Continue with current plan of care per medical team  Patient continues to lean towards no dialysis. Seems to struggle with making final decision as she expresses hope that her children would say how they feel in regards the need to start or not start dialysis. May need a more direct approach as she does mention, "if she doesn't have a choice to make and the medical team tells her it is not an option, she would be ok with that and would not have to worry about making a decision because it is made". I encouraged her to follow her heart and if she continues to feel she would not want to undergo dialysis that would very much appropriate and understandable. Sounds based on her discussion with her family they are in support either way. She does not wish to have a family meeting stating Jackie Parker understands and her children is more receptive to information being given by her (the patient) or Jackie Parker.   Pending on her final decisions outpatient palliative versus hospice would need to be determined.   PMT will continue to support and follow.    Prognosis: Guarded to Poor   Discharge Planning: To Be Determined   Care plan was discussed with patient and RN.   Thank you for allowing the Palliative Medicine Team to assist in the care of this patient.  Time Total: 55 min.   Visit consisted of counseling and education dealing with the complex and emotionally intense issues of symptom management and palliative care in the setting of serious and potentially life-threatening illness.Greater than 50%  of this time was spent  counseling and coordinating care related to the above assessment and plan.  Jackie Parker, AGPCNP-BC  Palliative Medicine Team [phone]

## 2020-03-01 NOTE — Plan of Care (Signed)
  Problem: Coping: Goal: Level of anxiety will decrease Outcome: Completed/Met   Problem: Pain Managment: Goal: General experience of comfort will improve Outcome: Completed/Met   

## 2020-03-02 LAB — RENAL FUNCTION PANEL
Albumin: 2.6 g/dL — ABNORMAL LOW (ref 3.5–5.0)
Anion gap: 14 (ref 5–15)
BUN: 72 mg/dL — ABNORMAL HIGH (ref 8–23)
CO2: 21 mmol/L — ABNORMAL LOW (ref 22–32)
Calcium: 8.3 mg/dL — ABNORMAL LOW (ref 8.9–10.3)
Chloride: 102 mmol/L (ref 98–111)
Creatinine, Ser: 6.42 mg/dL — ABNORMAL HIGH (ref 0.44–1.00)
GFR calc Af Amer: 7 mL/min — ABNORMAL LOW (ref >60)
GFR calc non Af Amer: 6 mL/min — ABNORMAL LOW (ref >60)
Glucose, Bld: 82 mg/dL (ref 70–99)
Phosphorus: 6 mg/dL — ABNORMAL HIGH (ref 2.5–4.6)
Potassium: 4.2 mmol/L (ref 3.5–5.1)
Sodium: 137 mmol/L (ref 135–145)

## 2020-03-02 MED ORDER — AMIODARONE HCL 200 MG PO TABS
400.0000 mg | ORAL_TABLET | Freq: Every day | ORAL | Status: DC
  Administered 2020-03-03 – 2020-03-05 (×3): 400 mg via ORAL
  Filled 2020-03-02 (×3): qty 2

## 2020-03-02 NOTE — Progress Notes (Signed)
Daily Progress Note   Patient Name: Jackie Parker       Date: 03/02/2020 DOB: [birth year]  Age: 79 y.o. MRN#: [id number] Attending Physician: Georgette Shell, MD Primary Care Physician: [name], MD Admit Date: 02/24/2020  Reason for Consultation/Follow-up: Establishing goals of care  Subjective: Patient sitting up in recliner. Denies pain or shortness of breath. States she had a restful night. Daughter, Jackie Parker is at the bedside.   Reviewed goals of care discussion and patient's decision for DNR/DNI yesterday. Daughter verbalizes understanding and agreement with her mother's decisions. She shares her brothers also agree.   Created opportunity to further discuss patient's decision regarding dialysis. Ms. Baines states she has given extensive though and discussed with her children. She and her family has decided to forego dialysis with wishes to continue with other medical management. She has a clear understanding of no meaningful improvement and that her prognosis is poor. Jackie Parker verbalizes understanding and states she agrees with her mother's decision and she has her full support. They are hopeful patient can return home and spend what time she has with family and friends, knowing it is limited. Support given.   We discussed what care would look like in the home ensuring symptoms are well controlled. Patient verbalizes her appreciation of not having pain at this time and is hopeful that she continues to have minimal pain during this time. Support given. Family understands the best care once she returns home would be supported by hospice based on expressed goals for medical management and no further aggressive interventions. Patient states she is somewhat overwhelmed and wished to confirm home with hospice and further discuss tomorrow given she has made one major decision. Emotional support provided. Daughter verbalized appreciation of continued discussion and the support of the medical  team and care provided.   Patient requested that all medical team be alerted with her decisions regarding her declination for dialysis. Assured her I would place her wishes in my documentation so that all team members would be aware. Both she and her daughter verbalized understanding and appreciation.   All questions answered and support given.   Length of Stay: 7 days  Vital Signs: BP (!) 107/51 (BP Location: Right Wrist)   Pulse 73   Temp 97.8 F (36.6 C) (Oral)   Resp 20   Ht 5' 2"  (1.575 m)   Wt 114.4 kg   SpO2 100%   BMI 46.11 kg/m  SpO2: SpO2: 100 % O2 Device: O2 Device: Room Air O2 Flow Rate: O2 Flow Rate (L/min): 1 L/min  Intake/output summary:   Intake/Output Summary (Last 24 hours) at 03/02/2020 1320 Last data filed at 03/02/2020 1246 Gross per 24 hour  Intake 940 ml  Output 1450 ml  Net -510 ml   LBM:   Baseline Weight: Weight: 114.6 kg Most recent weight: Weight: 114.4 kg  Physical Exam: -alert and oriented, NAD -normal breathing pattern             Palliative Care Assessment & Plan    Code Status:  DNR   Goals of Care:  DNR/DNI-confirmed by patient and daughter   Continue with current plan of care per medical team  Patient has made decision to forego dialysis, continue with current medical regimen as appropriate at home, and enjoy what time she has left in the home with family and friends.   Pending on her final decisions outpatient palliative versus hospice would need to be determined.   PMT will continue to support and  follow.    Prognosis: Guarded to Poor   Discharge Planning: To Be Determined   Care plan was discussed with patient, daughter, and RN.   Thank you for allowing the Palliative Medicine Team to assist in the care of this patient.  Time Total:45  Min.   Visit consisted of counseling and education dealing with the complex and emotionally intense issues of symptom management and palliative care in the setting of serious and  potentially life-threatening illness.Greater than 50%  of this time was spent counseling and coordinating care related to the above assessment and plan.  Alda Lea, AGPCNP-BC  Palliative Medicine Team [phone]

## 2020-03-02 NOTE — Progress Notes (Signed)
Asked to reevaluate for AFib w RVR.    79 y.o. female with a past medical history significant for diastolic heart failure, persistent atrial fibrillation (on amiodarone and no anticoagulation due to uterine bleeding), chronic kidney disease,obesity (BMI 45),HTN, ulcerative colitis (s/p colectomy 1977), OA s/p TKR, chronic hypokalemia, psoriasis, and asthma, who presents with shortness of breath.   Recent long admission 5/24-6/4 with acute dCHF. RHC showed moderate pulmonary venous HTN, mean PAP 40 mmHg, elevated filling pressures, mean PCWP 30 mmHg with prominent V wave to 54 mmHg, good cardiac output. Diuresed 9L. Discharge weight 113.9kg. Discharge lasix dose was 84m BID.   Had a breakthrough episode of Afib with RVR that lasted for just over an hour, possibly triggered by exertion. Now back in NSR.   Avoiding beta blockers due to asthma (on LABA) and BP precludes higher diltiazem doses. Not on anticoagulants due to uterine bleeding.  Will increase the amiodarone to 400 mg daily for 2 weeks, then decrease back to the 200 mg daily dose. Additional arrhythmia breakthrough episodes are to be expected, but would not change the overall treatment plans unless the arhythmia becomes persistent.  [facility] will sign off.   Medication Recommendations:  Amiodarone 400 mg daily through July 04, then 200 mg daily Other recommendations (labs, testing, etc):  n/a Follow up as an outpatient:  JKathyrn Drownon 03/23/20

## 2020-03-02 NOTE — Progress Notes (Signed)
  [facility] Progress Note    Assessment/ Plan:   1.  AKI on CKD IV: baseline Cr steadily uptrending since 09/2019, most recent Cr 3.9 02/14/20.  Likely cardiorenal syndrome, UA unremarkable and renal US without hydro.  She tells me today that she's thought a lot about her dialysis decision and still is kind of struggling with it but for now doesn't want to pursue.  I greatly appreciate palliative care involvement.    2.  Acute diastolic CHF: weights are coming down slightly.  Continue Lasix 80 mg IV x 6 and metolazone 2.5 mg daily  3.  Afib: cardiology has signed off  4. Dispo: greatly appreciate palliative.    Subjective:    Feeling "better and better" she says.  Discussed with her dialysis today--> she does not wish to pursue dialysis but also says that she is still struggling with her decision.  I greatly appreciate palliative care.     Objective:   BP (!) 107/51 (BP Location: Right Wrist)   Pulse 73   Temp 97.8 F (36.6 C) (Oral)   Resp 20   Ht 5' 2"  (1.575 m)   Wt 114.4 kg   SpO2 100%   BMI 46.11 kg/m   Intake/Output Summary (Last 24 hours) at 03/02/2020 1356 Last data filed at 03/02/2020 1246 Gross per 24 hour  Intake 940 ml  Output 1450 ml  Net -510 ml   Weight change: -0.953 kg  Physical Exam: Gen: NAD, sitting in bed CVS: RRR Resp: clear Abd: soft, nontender Ext: 2+ LE edema with cobblestoning  Imaging: No results found.  Labs: BMET Recent Labs  Lab 02/24/20 1945 02/24/20 1945 02/24/20 1956 02/26/20 0411 02/27/20 0337 02/28/20 0518 02/29/20 0717 03/01/20 0600 03/02/20 0440  NA 137   < > 138 137 136 135 137 135 137  K 4.3   < > 4.2 4.6 4.5 5.2* 3.9 3.3* 4.2  CL 103   < > 106 103 103 102 103 101 102  CO2 22  --   --  22 24 24 24 23  21*  GLUCOSE 98   < > 99 91 97 92 85 89 82  BUN 46*   < > 47* 53* 57* 61* 63* 66* 72*  CREATININE 5.20*   < > 5.60* 5.73* 5.98* 6.35* 6.44* 6.43* 6.42*  CALCIUM 8.8*  --   --  8.5* 8.5* 8.5* 8.3* 8.3*  8.3*  PHOS  --   --   --   --   --   --   --  5.7* 6.0*   < > = values in this interval not displayed.   CBC Recent Labs  Lab 02/24/20 1945 02/24/20 1956  WBC 9.8  --   NEUTROABS 6.0  --   HGB 11.2* 12.9  HCT 36.6 38.0  MCV 94.6  --   PLT 157  --     Medications:    . amiodarone  200 mg Oral Daily  . brimonidine  1 drop Right Eye TID with meals  . brinzolamide  1 drop Right Eye TID  . furosemide  80 mg Intravenous Q6H  . heparin  5,000 Units Subcutaneous Q8H  . latanoprost  1 drop Both Eyes QHS  . medroxyPROGESTERone  20 mg Oral Daily  . metolazone  2.5 mg Oral Daily  . mometasone-formoterol  2 puff Inhalation BID  . sodium chloride flush  3 mL Intravenous Q12H      Madelon Lips, MD 03/02/2020, 1:56 PM

## 2020-03-02 NOTE — Progress Notes (Addendum)
PROGRESS NOTE    Makaela Cando  LKT:[id number] DOB: July 07, 1941 DOA: 02/24/2020 PCP: [name], MD  Brief Narrative:79 year old female with history of atrial fibrillation not on anticoagulation due to uterine bleeding, stage III CKD stage IIIb, chronic diastolic CHF admitted with acute on chronic CHF exacerbation. Patient was admitted to the hospital 02/14/2020 with the same she underwent right heart catheterization and echocardiogram at that time. She was adherent to her diuretics at home in spite of it patient developed shortness of breath and swelling. She also noticed new right arm swelling. She is admitted for acute on chronic diastolic CHF and AKI on CKD stage IIIb.  Assessment & Plan:   Principal Problem:   Acute on chronic diastolic CHF (congestive heart failure) (HCC) Active Problems:   Acute renal failure superimposed on stage 4 chronic kidney disease (HCC)   AF (paroxysmal atrial fibrillation) (HCC)   Acute kidney failure (HCC)   Acute on chronic congestive heart failure (HCC)   Goals of care, counseling/discussion   Advanced care planning/counseling discussion   #1 recurrent acute on chronic diastolic CHF-followed by cardiology and nephrology. Lasix dose increased to 3 times a day since urine output has not improved.Added Zaroxolyn 2.5 mg without much improvement. Echo with normal systolic function. Cardiac cath earlier this month moderate pulmonary hypertension She remains fluid overloaded in spite of increasing Lasix and adding Zaroxolyn this is complicated by soft blood pressure. Appreciate palliative care input and there discussion with family today to decide on where to go from here.  #2 AKI on CKD stage IIIb to stage IV-her baseline creatinine is around 2.  At the time of admission her creatinine was 5.2. Creatinine continues to trend up  to 6.4.   At the time of discharge from the hospital after cardiac cath earlier this month her creatinine was  3.94. Renal ultrasoundno hydronephrosis renal cortical thinning suggesting mild atrophy. Cyst in the mid left kidney. Normal TSH. Potassium 4.2 was on Lokelma which has been stopped 03/01/2020.   #3 paroxysmal atrial fibrillation on amiodarone and diltiazem.Diltiazem stopped today due to soft blood pressure. Not on anticoagulation due to fibroid bleeding.  Patient went into rapid A. fib today while walking with physical therapy with rate coming down to the low 100s once she was at rest.  Discussed with cardiology recommended to increase amiodarone to 400 mg daily for 2 weeks then decrease to 200 mg daily. Amiodarone increased to 400 mg daily on 03/02/2020.  #4 abnormal UA since patient not symptomatic not started on antibiotics.  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  #7 bleeding fibroids on medroxyprogesterone continue. No bleeds reported from overnight staff    Estimated body mass index is 46.11 kg/m as calculated from the following:   Height as of this encounter: 5' 2"  (1.575 m).   Weight as of this encounter: 114.4 kg.  DVT prophylaxis:Subcu heparin  code Status:Full code Family Communication:dw daughter disposition Plan:Status is: Inpatient  Dispo:  Patient From: Home Planned Disposition: To be determined Expected discharge date: 03/02/20 Medically stable for discharge: Nopatient with fluid overload and worsening renal function on IV diuretics   Consultants:  Cardiology and nephrology palliative care  Procedures:None Antimicrobialsnone     Subjective:  Patient resting in bed still thinking about whether to have dialysis or not to have dialysis she denies any shortness of breath or chest pain nausea or vomiting Objective: Vitals:  03/02/20 0732 03/02/20 1013  03/02/20 1014 03/02/20 1052  BP:  135/86  (!) 107/51  Pulse: 72 (!) 109  73  Resp: 18 20  20   Temp:  98.6 F (37 C)  97.8 F (36.6 C)  TempSrc:  Oral  Oral  SpO2:  100% 92% 100%  Weight:      Height:        Intake/Output Summary (Last 24 hours) at 03/02/2020 1409 Last data filed at 03/02/2020 1246 Gross per 24 hour  Intake 940 ml  Output 1450 ml  Net -510 ml   Filed Weights   02/29/20 0200 03/01/20 0305 03/02/20 0320  Weight: 115.8 kg 115.3 kg 114.4 kg    Examination:  General exam: Appears calm and comfortable  Respiratory system: diminished at bases to auscultation. Respiratory effort normal. Cardiovascular system: S1 & S2 heard, RRR. No JVD, murmurs, rubs, gallops or clicks. No pedal edema. Gastrointestinal system: Abdomen is nondistended, soft and nontender. No organomegaly or masses felt. Normal bowel sounds heard. Central nervous system: Alert and oriented. No focal neurological deficits. Extremities: 2plus edema chronic venous stasis changes Skin: No rashes, lesions or ulcers Psychiatry: Judgement and insight appear normal. Mood & affect appropriate.     Data Reviewed: I have personally reviewed following labs and imaging studies  CBC: Recent Labs  Lab 02/24/20 1945 02/24/20 1956  WBC 9.8  --   NEUTROABS 6.0  --   HGB 11.2* 12.9  HCT 36.6 38.0  MCV 94.6  --   PLT 157  --    Basic Metabolic Panel: Recent Labs  Lab 02/27/20 0337 02/28/20 0518 02/29/20 0717 03/01/20 0600 03/02/20 0440  NA 136 135 137 135 137  K 4.5 5.2* 3.9 3.3* 4.2  CL 103 102 103 101 102  CO2 24 24 24 23  21*  GLUCOSE 97 92 85 89 82  BUN 57* 61* 63* 66* 72*  CREATININE 5.98* 6.35* 6.44* 6.43* 6.42*  CALCIUM 8.5* 8.5* 8.3* 8.3* 8.3*  PHOS  --   --   --  5.7* 6.0*   GFR: Estimated Creatinine Clearance: 8.6 mL/min (A) (by C-G formula based on SCr of 6.42 mg/dL (H)). Liver Function Tests: Recent Labs  Lab 02/24/20 1945 03/01/20 0600 03/02/20 0440  AST 27  --   --   ALT 19   --   --   ALKPHOS 47  --   --   BILITOT 1.4*  --   --   PROT 8.1  --   --   ALBUMIN 3.1* 2.6* 2.6*   No results for input(s): LIPASE, AMYLASE in the last 168 hours. No results for input(s): AMMONIA in the last 168 hours. Coagulation Profile: No results for input(s): INR, PROTIME in the last 168 hours. Cardiac Enzymes: No results for input(s): CKTOTAL, CKMB, CKMBINDEX, TROPONINI in the last 168 hours. BNP (last 3 results) Recent Labs    12/02/19 1224  PROBNP 779*   HbA1C: No results for input(s): HGBA1C in the last 72 hours. CBG: Recent Labs  Lab 02/27/20 1618 02/27/20 2135  GLUCAP 120* 123*   Lipid Profile: No results for input(s): CHOL, HDL, LDLCALC, TRIG, CHOLHDL, LDLDIRECT in the last 72 hours. Thyroid Function Tests: No results for input(s): TSH, T4TOTAL, FREET4, T3FREE, THYROIDAB in the last 72 hours. Anemia Panel: No results for input(s): VITAMINB12, FOLATE, FERRITIN, TIBC, IRON, RETICCTPCT in the last 72 hours. Sepsis Labs: No results for input(s): PROCALCITON, LATICACIDVEN in the last 168 hours.  Recent Results (from the past 240 hour(s))  SARS CORONAVIRUS 2 (TAT 6-24 HRS) Nasopharyngeal Nasopharyngeal Swab     Status: None   Collection Time: 02/22/20 12:02 PM   Specimen: Nasopharyngeal Swab  Result Value Ref Range Status   SARS Coronavirus 2 NEGATIVE NEGATIVE Final    Comment: (NOTE) SARS-CoV-2 target nucleic acids are NOT DETECTED.  The SARS-CoV-2 RNA is generally detectable in upper and lower respiratory specimens during the acute phase of infection. Negative results do not preclude SARS-CoV-2 infection, do not rule out co-infections with other pathogens, and should not be used as the sole basis for treatment or other patient management decisions. Negative results must be combined with clinical observations, patient history, and epidemiological information. The expected result is Negative.  Fact Sheet for  Patients: SugarRoll.be  Fact Sheet for Healthcare Providers: https://www.woods-.com/  This test is not yet approved or cleared by the [place] FDA and  has been authorized for detection and/or diagnosis of SARS-CoV-2 by FDA under an Emergency Use Authorization (EUA). This EUA will remain  in effect (meaning this test can be used) for the duration of the COVID-19 declaration under Se ction 564(b)(1) of the Act, 21 U.S.C. section 360bbb-3(b)(1), unless the authorization is terminated or revoked sooner.  Performed at [facility], [place] [address], [place], [place] [address]   SARS Coronavirus 2 by RT PCR (hospital order, performed in [facility] hospital lab) Nasopharyngeal Nasopharyngeal Swab     Status: None   Collection Time: 02/24/20 10:55 PM   Specimen: Nasopharyngeal Swab  Result Value Ref Range Status   SARS Coronavirus 2 NEGATIVE NEGATIVE Final    Comment: (NOTE) SARS-CoV-2 target nucleic acids are NOT DETECTED.  The SARS-CoV-2 RNA is generally detectable in upper and lower respiratory specimens during the acute phase of infection. The lowest concentration of SARS-CoV-2 viral copies this assay can detect is 250 copies / mL. A negative result does not preclude SARS-CoV-2 infection and should not be used as the sole basis for treatment or other patient management decisions.  A negative result may occur with improper specimen collection / handling, submission of specimen other than nasopharyngeal swab, presence of viral mutation(s) within the areas targeted by this assay, and inadequate number of viral copies (<250 copies / mL). A negative result must be combined with clinical observations, patient history, and epidemiological information.  Fact Sheet for Patients:   StrictlyIdeas.no  Fact Sheet for Healthcare Providers: BankingDealers.co.za  This test is not yet  approved or  cleared by the [place] FDA and has been authorized for detection and/or diagnosis of SARS-CoV-2 by FDA under an Emergency Use Authorization (EUA).  This EUA will remain in effect (meaning this test can be used) for the duration of the COVID-19 declaration under Section 564(b)(1) of the Act, 21 U.S.C. section 360bbb-3(b)(1), unless the authorization is terminated or revoked sooner.  Performed at [facility], [place] [address], [place], [place] [address]          Radiology Studies: No results found.      Scheduled Meds: . amiodarone  200 mg Oral Daily  . brimonidine  1 drop Right Eye TID with meals  . brinzolamide  1 drop Right Eye TID  . furosemide  80 mg Intravenous Q6H  . heparin  5,000 Units Subcutaneous Q8H  . latanoprost  1 drop Both Eyes QHS  . medroxyPROGESTERone  20 mg Oral Daily  . metolazone  2.5 mg Oral Daily  . mometasone-formoterol  2 puff Inhalation BID  . sodium chloride flush  3 mL Intravenous Q12H   Continuous Infusions: . sodium chloride       LOS: 7 days     Georgette Shell, MD  03/02/2020, 2:09 PM

## 2020-03-02 NOTE — Progress Notes (Signed)
   03/02/20 1013  Assess: MEWS Score  Temp 98.6 F (37 C)  BP 135/86  Pulse Rate (!) 109  Resp 20  SpO2 100 %  O2 Device Room Air  Assess: MEWS Score  MEWS Temp 0  MEWS Systolic 0  MEWS Pulse 1  MEWS RR 0  MEWS LOC 0  MEWS Score 1  MEWS Score Color Green  Assess: if the MEWS score is Yellow or Red  Were vital signs taken at a resting state? Yes  Focused Assessment Documented focused assessment  Early Detection of Sepsis Score *See Row Information* Low  MEWS guidelines implemented *See Row Information* No, vital signs rechecked  Treat  MEWS Interventions Administered scheduled meds/treatments;Other (Comment) (notified MD)  Notify: Charge Nurse/RN  Name of Charge Nurse/RN Notified  Forrest Moron, RN)  Date Charge Nurse/RN Notified 03/02/20  Time Charge Nurse/RN Notified 2  Notify: Provider  Provider Name/Title Rodena Piety, MD  Date Provider Notified 03/02/20  Time Provider Notified 1018  Notification Type Call  Notification Reason Other (Comment) (Convetred from NSR to AFib )  Response Other (Comment) (cardiology reconsulted)  Document  Patient Outcome Other (Comment) (Cardiology reconsulted/ from NSR to AFIB convetred )

## 2020-03-02 NOTE — Progress Notes (Signed)
Pt converted to AFib from NSR. EKG completed. MD Rodena Piety informed.

## 2020-03-02 NOTE — Progress Notes (Signed)
Physical Therapy Treatment Patient Details Name: Jackie Parker MRN: [id number] DOB: 1941-05-27 Today's Date: 03/02/2020    History of Present Illness Jackie Parker is a 79 y.o. female with medical history significant for chronic diastolic CHF, atrial fibrillation not anticoagulated due to bleeding, chronic kidney disease stage IIIb, asthma, and ulcerative colitis status post remote colectomy, now presenting to the emergency department for evaluation of shortness of breath and swelling.  The patient was recently admitted to the hospital and discharged on 02/14/2020 after she was diuresed.  She underwent echocardiogram and right heart catheterization during the recent admission, put out 9 L of urine, and was in much improved condition when she went home.  Since returning home, patient has become progressively more short of breath despite adherence with her diuretics.    PT Comments    Patient received in recliner, reports her B ankles hurt, but she is agreeable to PT session. She requires min assist to stand from low recliner. Min guard for ambulation of 50 feet with RW. HR up to 143 with walking short distance, O2 sats at 92%. She is limited with gait due to her pain and fatigue. She will continue to benefit from skilled PT while here to improve activity tolerance, strength and safety with mobility for return home with family.      Follow Up Recommendations  Home health PT;Supervision/Assistance - 24 hour     Equipment Recommendations  None recommended by PT    Recommendations for Other Services       Precautions / Restrictions Precautions Precautions: Fall Restrictions Weight Bearing Restrictions: No    Mobility  Bed Mobility               General bed mobility comments: patient received in recliner and remained up in recliner  Transfers Overall transfer level: Needs assistance Equipment used: Rolling walker (2 wheeled) Transfers: Sit to/from Stand Sit to Stand: Min  assist         General transfer comment: min assist needed from recliner  Ambulation/Gait Ambulation/Gait assistance: Min guard Gait Distance (Feet): 50 Feet Assistive device: Rolling walker (2 wheeled) Gait Pattern/deviations: Step-to pattern;Decreased step length - right;Decreased step length - left;Shuffle Gait velocity: decr   General Gait Details: Patient limited with ambulation this session due to reported LE pain and HR up to 143 with ambulation of short distance.   Stairs             Wheelchair Mobility    Modified Rankin (Stroke Patients Only)       Balance Overall balance assessment: Needs assistance Sitting-balance support: Feet supported Sitting balance-Leahy Scale: Good     Standing balance support: Bilateral upper extremity supported;During functional activity Standing balance-Leahy Scale: Fair Standing balance comment: requires rw and supervision/min guard for safety                            Cognition Arousal/Alertness: Awake/alert Behavior During Therapy: WFL for tasks assessed/performed Overall Cognitive Status: Within Functional Limits for tasks assessed                                        Exercises Other Exercises Other Exercises: seated B LE exercises to include: ap, hip abd/add, LAQ, marching x 10 reps each    General Comments        Pertinent Vitals/Pain Pain Assessment: Faces Faces Pain Scale:  Hurts little more Pain Location: B ankles Pain Descriptors / Indicators: Sore;Discomfort Pain Intervention(s): Monitored during session;Limited activity within patient's tolerance    Home Living Family/patient expects to be discharged to:: Private residence                    Prior Function            PT Goals (current goals can now be found in the care plan section) Acute Rehab PT Goals Patient Stated Goal: to go home PT Goal Formulation: With patient Time For Goal Achievement:  03/12/20 Potential to Achieve Goals: Good Progress towards PT goals: Progressing toward goals    Frequency    Min 3X/week      PT Plan Current plan remains appropriate    Co-evaluation              AM-PAC PT "6 Clicks" Mobility   Outcome Measure  Help needed turning from your back to your side while in a flat bed without using bedrails?: None Help needed moving from lying on your back to sitting on the side of a flat bed without using bedrails?: None Help needed moving to and from a bed to a chair (including a wheelchair)?: None Help needed standing up from a chair using your arms (e.g., wheelchair or bedside chair)?: A Little Help needed to walk in hospital room?: A Little Help needed climbing 3-5 steps with a railing? : A Lot 6 Click Score: 20    End of Session Equipment Utilized During Treatment: Gait belt Activity Tolerance: Patient limited by pain;Patient limited by fatigue Patient left: in chair;with call bell/phone within reach Nurse Communication: Mobility status PT Visit Diagnosis: Muscle weakness (generalized) (M62.81);Pain Pain - Right/Left:  (bilateral) Pain - part of body: Ankle and joints of foot     Time: 3559-7416 PT Time Calculation (min) (ACUTE ONLY): 22 min  Charges:  $Gait Training: 8-22 mins $Therapeutic Exercise: 8-22 mins                     Jackie Parker, PT, GCS 03/02/20,10:21 AM

## 2020-03-03 LAB — RENAL FUNCTION PANEL
Albumin: 2.6 g/dL — ABNORMAL LOW (ref 3.5–5.0)
Anion gap: 13 (ref 5–15)
BUN: 73 mg/dL — ABNORMAL HIGH (ref 8–23)
CO2: 24 mmol/L (ref 22–32)
Calcium: 8.3 mg/dL — ABNORMAL LOW (ref 8.9–10.3)
Chloride: 99 mmol/L (ref 98–111)
Creatinine, Ser: 6.26 mg/dL — ABNORMAL HIGH (ref 0.44–1.00)
GFR calc Af Amer: 7 mL/min — ABNORMAL LOW (ref >60)
GFR calc non Af Amer: 6 mL/min — ABNORMAL LOW (ref >60)
Glucose, Bld: 79 mg/dL (ref 70–99)
Phosphorus: 5.3 mg/dL — ABNORMAL HIGH (ref 2.5–4.6)
Potassium: 3.1 mmol/L — ABNORMAL LOW (ref 3.5–5.1)
Sodium: 136 mmol/L (ref 135–145)

## 2020-03-03 MED ORDER — POTASSIUM CHLORIDE CRYS ER 20 MEQ PO TBCR
20.0000 meq | EXTENDED_RELEASE_TABLET | Freq: Once | ORAL | Status: AC
  Administered 2020-03-03: 20 meq via ORAL
  Filled 2020-03-03: qty 1

## 2020-03-03 NOTE — Progress Notes (Addendum)
PROGRESS NOTE    Jackie Parker  GGE:[id number] DOB: 1940/12/13 DOA: 02/24/2020 PCP: [name], MD  Brief Narrative:79 year old female with history of atrial fibrillation not on anticoagulation due to uterine bleeding, stage III CKD stage IIIb, chronic diastolic CHF admitted with acute on chronic CHF exacerbation. Patient was admitted to the hospital 02/14/2020 with the same she underwent right heart catheterization and echocardiogram at that time. She was adherent to her diuretics at home in spite of it patient developed shortness of breath and swelling. She also noticed new right arm swelling. She is admitted for acute on chronic diastolic CHF and AKI on CKD stage IIIb.  Assessment & Plan:   Principal Problem:   Acute on chronic diastolic CHF (congestive heart failure) (HCC) Active Problems:   Acute renal failure superimposed on stage 4 chronic kidney disease (HCC)   AF (paroxysmal atrial fibrillation) (HCC)   Acute kidney failure (HCC)   Acute on chronic congestive heart failure (HCC)   Goals of care, counseling/discussion   Advanced care planning/counseling discussion   #1 recurrent acute on chronic diastolic CHF-was followed by cardiology and nephrology.  No signed off.  Currently on Lasix 80 mg every 6 with Zaroxolyn 2.5 mg daily.  Renal recommends p.o. torsemide when switching to p.o. diuresis. Echo with normal systolic function. Cardiac cath earlier this month moderate pulmonary hypertension She remains fluid overloaded in spite of increasing Lasix and adding Zaroxolyn this is complicated by soft blood pressure. Appreciate palliative care input and there discussion with family today to decide on where to go from here. Patient does not want to do dialysis DNR/DNI Will await final decision to discharge home with palliative or hospice. I have discussed with her daughter in regards to discharging her home with hospice on Thursday.  #2 AKI on CKD stage IIIb to stage IV-her  baseline creatinine is around 2.  At the time of admission her creatinine was 5.2. Creatinine continues to trend up  to 6.26 At the time of discharge from the hospital after cardiac cath earlier this month her creatinine was 3.94. Renal ultrasoundno hydronephrosis renal cortical thinning suggesting mild atrophy. Cyst in the mid left kidney. Normal TSH. Potassium 4.2 was on Lokelma which has been stopped 03/01/2020.   #3 paroxysmal atrial fibrillation was on amiodarone and diltiazem.Diltiazem stopped today due to soft blood pressure. Not on anticoagulation due to fibroid bleeding.  Patient went into rapid A. fib yesterday while walking with physical therapy with rate coming down to the low 100s once she was at rest.  Discussed with cardiology recommended to increase amiodarone to 400 mg daily for 2 weeks then decrease to 200 mg daily. Amiodarone increased to 400 mg daily on 03/02/2020.  #4 abnormal UA since patient not symptomatic not started on antibiotics.  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  #7 bleeding fibroids on medroxyprogesterone continue. No bleeds reported from overnight staff    Estimated body mass index is 45.91 kg/m as calculated from the following:   Height as of this encounter: 5' 2"  (1.575 m).   Weight as of this encounter: 113.9 kg.  DVT prophylaxis:Subcu heparin  code Status:Full code Family Communication:dw daughter disposition Plan:Status is: Inpatient  Dispo:  Patient From: Home Planned Disposition: To be determined Expected discharge date: 03/02/20 Medically stable for discharge: Nopatient with fluid overload and worsening renal function on IV diuretics   Consultants:  Cardiology and nephrology  palliative  care  Procedures:None Antimicrobialsnone Subjective:  Patient resting in bed still thinking about whether to have dialysis or not to have dialysis she denies any shortness of breath or chest pain nausea or vomiting Objective: Vitals:   03/03/20 0333 03/03/20 0732 03/03/20 0823 03/03/20 1237  BP: (!) 109/52  (!) 128/58 124/60  Pulse: 73  81 82  Resp: 18   18  Temp: 98.7 F (37.1 C)   98.3 F (36.8 C)  TempSrc: Oral   Oral  SpO2: 92% 90% 92% 94%  Weight: 113.9 kg     Height:        Intake/Output Summary (Last 24 hours) at 03/03/2020 1407 Last data filed at 03/03/2020 1335 Gross per 24 hour  Intake 840 ml  Output 1400 ml  Net -560 ml   Filed Weights   03/01/20 0305 03/02/20 0320 03/03/20 0333  Weight: 115.3 kg 114.4 kg 113.9 kg    Examination:  General exam: Appears calm and comfortable  Respiratory system: diminished at bases to auscultation. Respiratory effort normal. Cardiovascular system: S1 & S2 heard, RRR. No JVD, murmurs, rubs, gallops or clicks. No pedal edema. Gastrointestinal system: Abdomen is nondistended, soft and nontender. No organomegaly or masses felt. Normal bowel sounds heard. Central nervous system: Alert and oriented. No focal neurological deficits. Extremities: 2plus edema chronic venous stasis changes Skin: No rashes, lesions or ulcers Psychiatry: Judgement and insight appear normal. Mood & affect appropriate.     Data Reviewed: I have personally reviewed following labs and imaging studies  CBC: No results for input(s): WBC, NEUTROABS, HGB, HCT, MCV, PLT in the last 168 hours. Basic Metabolic Panel: Recent Labs  Lab 02/28/20 0518 02/29/20 0717 03/01/20 0600 03/02/20 0440 03/03/20 0546  NA 135 137 135 137 136  K 5.2* 3.9 3.3* 4.2 3.1*  CL 102 103 101 102 99  CO2 24 24 23  21* 24  GLUCOSE 92 85 89 82 79  BUN 61* 63* 66* 72* 73*  CREATININE 6.35* 6.44* 6.43* 6.42* 6.26*  CALCIUM 8.5* 8.3* 8.3* 8.3* 8.3*  PHOS  --   --  5.7* 6.0* 5.3*    GFR: Estimated Creatinine Clearance: 8.8 mL/min (A) (by C-G formula based on SCr of 6.26 mg/dL (H)). Liver Function Tests: Recent Labs  Lab 03/01/20 0600 03/02/20 0440 03/03/20 0546  ALBUMIN 2.6* 2.6* 2.6*   No results for input(s): LIPASE, AMYLASE in the last 168 hours. No results for input(s): AMMONIA in the last 168 hours. Coagulation Profile: No results for input(s): INR, PROTIME in the last 168 hours. Cardiac Enzymes: No results for input(s): CKTOTAL, CKMB, CKMBINDEX, TROPONINI in the last 168 hours. BNP (last 3 results) Recent Labs    12/02/19 1224  PROBNP 779*   HbA1C: No results for input(s): HGBA1C in the last 72 hours. CBG: Recent Labs  Lab 02/27/20 1618 02/27/20 2135  GLUCAP 120* 123*   Lipid Profile: No results for input(s): CHOL, HDL, LDLCALC, TRIG, CHOLHDL, LDLDIRECT in the last 72 hours. Thyroid Function Tests: No results for input(s): TSH, T4TOTAL, FREET4, T3FREE, THYROIDAB in the last 72 hours. Anemia Panel: No results for input(s): VITAMINB12, FOLATE, FERRITIN, TIBC, IRON, RETICCTPCT in the last 72 hours. Sepsis Labs: No results for input(s): PROCALCITON, LATICACIDVEN in the last 168 hours.  Recent Results (from the past 240 hour(s))  SARS Coronavirus 2 by RT PCR (hospital order, performed in [facility] hospital lab) Nasopharyngeal Nasopharyngeal Swab     Status: None   Collection Time: 02/24/20 10:55 PM   Specimen:  Nasopharyngeal Swab  Result Value Ref Range Status   SARS Coronavirus 2 NEGATIVE NEGATIVE Final    Comment: (NOTE) SARS-CoV-2 target nucleic acids are NOT DETECTED.  The SARS-CoV-2 RNA is generally detectable in upper and lower respiratory specimens during the acute phase of infection. The lowest concentration of SARS-CoV-2 viral copies this assay can detect is 250 copies / mL. A negative result does not preclude SARS-CoV-2 infection and should not be used as the sole basis for treatment or other patient management decisions.  A  negative result may occur with improper specimen collection / handling, submission of specimen other than nasopharyngeal swab, presence of viral mutation(s) within the areas targeted by this assay, and inadequate number of viral copies (<250 copies / mL). A negative result must be combined with clinical observations, patient history, and epidemiological information.  Fact Sheet for Patients:   StrictlyIdeas.no  Fact Sheet for Healthcare Providers: BankingDealers.co.za  This test is not yet approved or  cleared by the [place] FDA and has been authorized for detection and/or diagnosis of SARS-CoV-2 by FDA under an Emergency Use Authorization (EUA).  This EUA will remain in effect (meaning this test can be used) for the duration of the COVID-19 declaration under Section 564(b)(1) of the Act, 21 U.S.C. section 360bbb-3(b)(1), unless the authorization is terminated or revoked sooner.  Performed at [facility], [place] [address], [place], [place] [address]          Radiology Studies: No results found.      Scheduled Meds: . amiodarone  400 mg Oral Daily  . brimonidine  1 drop Right Eye TID with meals  . brinzolamide  1 drop Right Eye TID  . furosemide  80 mg Intravenous Q6H  . heparin  5,000 Units Subcutaneous Q8H  . latanoprost  1 drop Both Eyes QHS  . medroxyPROGESTERone  20 mg Oral Daily  . metolazone  2.5 mg Oral Daily  . mometasone-formoterol  2 puff Inhalation BID  . sodium chloride flush  3 mL Intravenous Q12H   Continuous Infusions: . sodium chloride       LOS: 8 days     Georgette Shell, MD  03/03/2020, 2:07 PM

## 2020-03-03 NOTE — TOC Initial Note (Signed)
Transition of Care [facility]) - Initial/Assessment Note    Patient Details  Name: Jackie Parker MRN: [id number] Date of Birth: 1941/01/25  Transition of Care [facility]) CM/SW Contact:    Zenon Mayo, RN Phone Number: 03/03/2020, 4:16 PM  Clinical Narrative:                 Patient will go home with hospice.  NCM spoke with patient and daughter and offered choice, they chose [facility]. Referral given to [facility] with [facility].  Patient has walker,cane and bsc at home.  She will need a hospital bed.  She will need ambulance transport, address confirmed with daughter. Plan for dc on Thursday.  Expected Discharge Plan: Home w Hospice Care Barriers to Discharge: Continued Medical Work up   Patient Goals and CMS Choice Patient states their goals for this hospitalization and ongoing recovery are:: home with hospice CMS Medicare.gov Compare Post Acute Care list provided to:: Patient Choice offered to / list presented to : Patient, Adult Children  Expected Discharge Plan and Services Expected Discharge Plan: [place]   Discharge Planning Services: CM Consult Post Acute Care Choice: Hospice Living arrangements for the past 2 months: Single Family Home                 DME Arranged: Hospital bed ([facility] will supply DME)   Date DME Agency Contacted: 03/03/20 Time DME Agency Contacted: [id number] Representative spoke with at DME Agency: [place]: RN [place] Agency:  Claris Che) Date [place]: 03/03/20 Time [place]: 14 Representative spoke with at [place]: Velta Addison  Prior Living Arrangements/Services Living arrangements for the past 2 months: [place] Lives with:: Adult Children Patient language and need for interpreter reviewed:: Yes Do you feel safe going back to the place where you live?: Yes      Need for Family Participation in Patient Care: Yes (Comment) Care giver support system in place?: Yes (comment) Current home  services: DME (has walker, cane, bsc) Criminal Activity/Legal Involvement Pertinent to Current Situation/Hospitalization: No - Comment as needed  Activities of Daily Living Home Assistive Devices/Equipment: None ADL Screening (condition at time of admission) Patient's cognitive ability adequate to safely complete daily activities?: Yes Is the patient deaf or have difficulty hearing?: No Does the patient have difficulty seeing, even when wearing glasses/contacts?: No Does the patient have difficulty concentrating, remembering, or making decisions?: No Patient able to express need for assistance with ADLs?: Yes Does the patient have difficulty dressing or bathing?: No Independently performs ADLs?: Yes (appropriate for developmental age) Does the patient have difficulty walking or climbing stairs?: Yes Weakness of Legs: Both Weakness of Arms/Hands: None  Permission Sought/Granted                  Emotional Assessment Appearance:: Appears stated age Attitude/Demeanor/Rapport: Engaged Affect (typically observed): Appropriate Orientation: : Oriented to Self, Oriented to Place, Oriented to  Time, Oriented to Situation Alcohol / Substance Use: Not Applicable Psych Involvement: No (comment)  Admission diagnosis:  Acute on chronic diastolic CHF (congestive heart failure) ([place]) [I50.33] Patient Active Problem List   Diagnosis Date Noted  . Acute kidney failure ([place])   . Acute on chronic congestive heart failure ([place])   . Goals of care, counseling/discussion   . Advanced care planning/counseling discussion   . Acute on chronic diastolic CHF (congestive heart failure) ([place]) 02/24/2020  . CKD (chronic kidney disease) stage 3, GFR 30-59 ml/min 02/13/2020  . Acute on chronic diastolic (congestive)  heart failure ([place]) 02/03/2020  . OSA (obstructive sleep apnea) 12/17/2019  . Atrial tachycardia ([place]) 10/02/2019  . Persistent atrial fibrillation ([place]) 08/15/2019  . Secondary  hypercoagulable state ([place]) 08/15/2019  . Dyspnea on exertion 08/02/2019  . Hypomagnesemia 08/02/2019  . AF (paroxysmal atrial fibrillation) ([place]) 08/01/2019  . IUD check up 08/16/2017  . Asthma 01/30/2017  . Gynecologic exam normal 11/08/2016  . Urinary incontinence 11/08/2016  . Leukocytosis 06/29/2016  . Anemia 06/29/2016  . Acute renal failure superimposed on stage 4 chronic kidney disease ([place])   . Primary localized osteoarthritis of right knee 06/27/2016  . Osteoarthritis of left knee 12/14/2015    Class: End Stage  . Osteoarthritis of right knee 12/14/2015  . Acute renal failure ([place]) 08/30/2013  . 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 N&V (nausea and vomiting) 08/30/2013  . Hypokalemia 08/30/2013  . Postmenopausal bleeding 03/18/2013  . OSTEOARTHRITIS, KNEE, LEFT 03/20/2010  . TRICUSPID REGURGITATION, MILD 03/03/2010  . Disorder resulting from impaired renal function 03/02/2010  . MORBID OBESITY 02/18/2010  . PREMATURE ATRIAL CONTRACTIONS 02/18/2010  . PREMATURE VENTRICULAR CONTRACTIONS 02/18/2010  . CARDIAC MURMUR 02/18/2010  . EDEMA 10/20/2009  . HYPOKALEMIA 07/30/2009  . RIB PAIN, RIGHT SIDED 05/26/2008  . ALLERGIC RHINITIS 06/25/2007  . ABNORMAL BLOOD CHEMISTRY , OTHER 06/25/2007  . UNSPECIFIED VITAMIN D DEFICIENCY 04/30/2007  . Hypocalcemia 04/19/2007  . GLAUCOMA NEC 04/19/2007  . Essential hypertension 04/19/2007  . Asthma 04/19/2007  . Ulcerative colitis ([place]) 04/19/2007  . DISORDER, MENSTRUAL NEC 04/19/2007  . PSORIASIS 04/19/2007  . 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 GOUT NOS 02/21/2006   PCP:  [name], MD Pharmacy:   [facility] [place], [address] [place] [address] Phone: [phone] Fax: [phone]     Social Determinants of Health (SDOH) Interventions    Readmission Risk Interventions Readmission Risk Prevention Plan  03/03/2020  Transportation Screening Complete  PCP or Specialist Appt within 3-5 Days Complete  HRI or [place] Complete  Social Work Consult for [place] Planning/Counseling Complete  Palliative Care Screening Complete  Medication Review Press photographer) Complete  Some recent data might be hidden

## 2020-03-03 NOTE — Plan of Care (Signed)
°  Problem: Education: °Goal: Ability to demonstrate management of disease process will improve °Outcome: Progressing °Goal: Ability to verbalize understanding of medication therapies will improve °Outcome: Progressing °Goal: Individualized Educational Video(s) °Outcome: Progressing °  °

## 2020-03-03 NOTE — Progress Notes (Signed)
Hydrologist [facility]) Hospital Liaison: RN note     Notified by Transition of Care Manger, Tomi Bamberger, RN of patient/family request for [facility] services at home after discharge. Chart and patient information under review by [facility] physician. Hospice eligibility pending currently.     Writer spoke with daughter Joseph Art to initiate education related to hospice philosophy, services and team approach to care.  Renee verbalized understanding of information given. Please send signed and completed DNR form home with patient/family. Patient will need prescriptions for discharge comfort medications.      DME needs have been discussed, patient currently has the following equipment in the home: walker, shower chair, 3N1 and cane.  Patient/family requests the following DME for delivery to the home: hospital bed. [place] equipment manager has been notified and will contact DME provider to arrange delivery to the home. Home address has been verified and is correct in the chart.  Joseph Art is the family member to contact to arrange time of delivery.      [facility] aware of the above. Please notify [facility] when patient is ready to leave the unit at discharge. (Call [phone] or [phone] after 5pm.) [facility] information and contact numbers given to  [facility].       Please call with any hospice related questions.      Thank you for this referral.      Farrel Gordon, RN, [facility] (listed on [place] under [place])   [phone]

## 2020-03-03 NOTE — Progress Notes (Signed)
  [facility] Progress Note    Assessment/ Plan:   1.  AKI on CKD IV: baseline Cr steadily uptrending since 09/2019, most recent Cr 3.9 02/14/20.  Likely cardiorenal syndrome, UA unremarkable and renal US without hydro.  No dialysis and hospice when appropriate.   2.  Acute diastolic CHF: weights are coming down slightly.  Continue Lasix 80 mg IV x 6 and metolazone 2.5 mg daily.  May want to try PO torsemide when switch to PO diuresis is pursued, could do as early as tomorrow..  3.  Afib: cardiology has signed off  4. Dispo: greatly appreciate palliative.  Nothing further to add from renal perspective.  Will sign off. She doesn't require followup with [place] as symptom management can be pursued with pall care/ PCP.    Subjective:    Pall care note reviewed from yesterday.  No dialysis has been decided.  She feels a little queasy today after eating pancakes for breakfast.  Cr stable, weights down.    Objective:   BP 124/60 (BP Location: Right Arm)   Pulse 82   Temp 98.3 F (36.8 C) (Oral)   Resp 18   Ht 5' 2"  (1.575 m)   Wt 113.9 kg   SpO2 94%   BMI 45.91 kg/m   Intake/Output Summary (Last 24 hours) at 03/03/2020 1252 Last data filed at 03/03/2020 0830 Gross per 24 hour  Intake 600 ml  Output 800 ml  Net -200 ml   Weight change: -0.499 kg  Physical Exam: Gen: NAD, sitting in bed CVS: RRR Resp: clear Abd: soft, nontender Ext: 2+ LE edema with cobblestoning  Imaging: No results found.  Labs: BMET Recent Labs  Lab 02/26/20 0411 02/27/20 0337 02/28/20 0518 02/29/20 0717 03/01/20 0600 03/02/20 0440 03/03/20 0546  NA 137 136 135 137 135 137 136  K 4.6 4.5 5.2* 3.9 3.3* 4.2 3.1*  CL 103 103 102 103 101 102 99  CO2 22 24 24 24 23  21* 24  GLUCOSE 91 97 92 85 89 82 79  BUN 53* 57* 61* 63* 66* 72* 73*  CREATININE 5.73* 5.98* 6.35* 6.44* 6.43* 6.42* 6.26*  CALCIUM 8.5* 8.5* 8.5* 8.3* 8.3* 8.3* 8.3*  PHOS  --   --   --   --  5.7* 6.0* 5.3*   CBC No  results for input(s): WBC, NEUTROABS, HGB, HCT, MCV, PLT in the last 168 hours.  Medications:    . amiodarone  400 mg Oral Daily  . brimonidine  1 drop Right Eye TID with meals  . brinzolamide  1 drop Right Eye TID  . furosemide  80 mg Intravenous Q6H  . heparin  5,000 Units Subcutaneous Q8H  . latanoprost  1 drop Both Eyes QHS  . medroxyPROGESTERone  20 mg Oral Daily  . metolazone  2.5 mg Oral Daily  . mometasone-formoterol  2 puff Inhalation BID  . sodium chloride flush  3 mL Intravenous Q12H      Madelon Lips, MD 03/03/2020, 12:52 PM

## 2020-03-03 NOTE — Progress Notes (Signed)
Maintaining NSR on telemetry.

## 2020-03-03 NOTE — Progress Notes (Signed)
   Daily Progress Note   Patient Name: Jackie Parker       Date: 03/03/2020 DOB: 1940/10/06  Age: 79 y.o. MRN#: [id number] Attending Physician: Georgette Shell, MD Primary Care Physician: [name], MD Admit Date: 02/24/2020  Reason for Consultation/Follow-up: Establishing goals of care  Subjective: Patient sitting up in recliner eating some toast. Denies pain or shortness of breath. Reports nausea after eating pancakes for breakfast. Some relief since PRN medication given. Daughter, Jackie Parker is at the bedside.   Patient confirmed goal of returning home with her daughter and spending what time she has with her family. Does not wish to speak in detail given nausea and trying to focus on gaining relief.   Support given.   1545: Per discussions with Dr. Rodena Piety patient and daughter have confirmed outpatient hospice support at discharge (planning for Thursday).   Length of Stay: 8 days  Vital Signs: BP 124/60 (BP Location: Right Arm)   Pulse 82   Temp 98.3 F (36.8 C) (Oral)   Resp 18   Ht 5' 2"  (1.575 m)   Wt 113.9 kg   SpO2 94%   BMI 45.91 kg/m  SpO2: SpO2: 94 % O2 Device: O2 Device: Room Air O2 Flow Rate: O2 Flow Rate (L/min): 1 L/min  Intake/output summary:   Intake/Output Summary (Last 24 hours) at 03/03/2020 1746 Last data filed at 03/03/2020 1335 Gross per 24 hour  Intake 840 ml  Output 1150 ml  Net -310 ml   LBM:   Baseline Weight: Weight: 114.6 kg Most recent weight: Weight: 113.9 kg  Physical Exam: -awake, alert & oriented, chronically-ill, NAD -normal breathing pattern        Palliative Care Assessment & Plan    Code Status:  DNR Goals of Care:  DNR/DNI-completed form on chart   Continue current plan of care per medical team  Home with hospice per Dr. Rodena Piety. Support final decisions given expressed wishes from patient for comfort, medical management, and no HD.   PMT will support and follow as needed.   Prognosis: < 6 months    Discharge Planning: Home with Hospice   Care plan was discussed with patient, daughter, Jackie Parker, and secured message with Dr. Rodena Piety.   Thank you for allowing the Palliative Medicine Team to assist in the care of this patient.  Time Total: 20 min.   Visit consisted of counseling and education dealing with the complex and emotionally intense issues of symptom management and palliative care in the setting of serious and potentially life-threatening illness.Greater than 50%  of this time was spent counseling and coordinating care related to the above assessment and plan.  Alda Lea, AGPCNP-BC  Palliative Medicine Team [phone]

## 2020-03-04 ENCOUNTER — Encounter (HOSPITAL_COMMUNITY): Payer: Self-pay | Admitting: Family Medicine

## 2020-03-04 LAB — RENAL FUNCTION PANEL
Albumin: 2.6 g/dL — ABNORMAL LOW (ref 3.5–5.0)
Anion gap: 13 (ref 5–15)
BUN: 73 mg/dL — ABNORMAL HIGH (ref 8–23)
CO2: 26 mmol/L (ref 22–32)
Calcium: 8.5 mg/dL — ABNORMAL LOW (ref 8.9–10.3)
Chloride: 98 mmol/L (ref 98–111)
Creatinine, Ser: 6.16 mg/dL — ABNORMAL HIGH (ref 0.44–1.00)
GFR calc Af Amer: 7 mL/min — ABNORMAL LOW (ref >60)
GFR calc non Af Amer: 6 mL/min — ABNORMAL LOW (ref >60)
Glucose, Bld: 95 mg/dL (ref 70–99)
Phosphorus: 5.5 mg/dL — ABNORMAL HIGH (ref 2.5–4.6)
Potassium: 3 mmol/L — ABNORMAL LOW (ref 3.5–5.1)
Sodium: 137 mmol/L (ref 135–145)

## 2020-03-04 MED ORDER — POTASSIUM CHLORIDE CRYS ER 20 MEQ PO TBCR
20.0000 meq | EXTENDED_RELEASE_TABLET | Freq: Two times a day (BID) | ORAL | Status: DC
  Filled 2020-03-04: qty 1

## 2020-03-04 MED ORDER — POTASSIUM CHLORIDE CRYS ER 20 MEQ PO TBCR
20.0000 meq | EXTENDED_RELEASE_TABLET | Freq: Two times a day (BID) | ORAL | Status: AC
  Administered 2020-03-04 (×2): 20 meq via ORAL
  Filled 2020-03-04 (×2): qty 1

## 2020-03-04 NOTE — Progress Notes (Addendum)
Pt with 10 beats of V tach. Pt asymptomatic and resting in bed. Awaiting PO Potassium to give. MD paged.

## 2020-03-04 NOTE — Progress Notes (Signed)
Pt. F/U spiritual care visit was attempted by the chaplain. The Pt. declined the visit, stating there is not much to talk about. The chaplain is available as needed for spiritual care.

## 2020-03-04 NOTE — Progress Notes (Signed)
Hydrologist [facility]) Hospital Liaison: RN note   Hospice Eligibility confirmed. Hospital Bed has been delivered.   Please notify [facility] when patient is ready to leave the unit at discharge. (Call [phone] or [phone] after 5pm.) [facility] information and contact numbers given to  [facility] ([facility]).       Please call with any hospice related questions.      Thank you for this referral.      Farrel Gordon, RN, [facility] (listed on [place] under [place])   [phone]

## 2020-03-04 NOTE — Progress Notes (Signed)
Physical Therapy Treatment Patient Details Name: Jackie Parker MRN: [id number] DOB: 1941-05-11 Today's Date: 03/04/2020    History of Present Illness Jackie Parker is a 79 y.o. female with medical history significant for chronic diastolic CHF, atrial fibrillation not anticoagulated due to bleeding, chronic kidney disease stage IIIb, asthma, and ulcerative colitis status post remote colectomy, now presenting to the emergency department for evaluation of shortness of breath and swelling.  The patient was recently admitted to the hospital and discharged on 02/14/2020 after she was diuresed.  She underwent echocardiogram and right heart catheterization during the recent admission, put out 9 L of urine, and was in much improved condition when she went home.  Since returning home, patient has become progressively more short of breath despite adherence with her diuretics.    PT Comments    Pt supine in bed with granddaughter in room. Pt refuses ambulation due to increased R ankle pain with weightbearing. Pt experiences pain with AAROM in dorsi/plantar flexion. Pt agreeable to coming to EoB for LE exercises. Pt requires supervision for bed mobility. D/c plans remain appropriate at this time. PT will continue to follow acutely.     Follow Up Recommendations  Home health PT;Supervision/Assistance - 24 hour     Equipment Recommendations  None recommended by PT       Precautions / Restrictions Precautions Precautions: Fall Restrictions Weight Bearing Restrictions: No    Mobility  Bed Mobility Overal bed mobility: Needs Assistance Bed Mobility: Supine to Sit;Sit to Supine     Supine to sit: Supervision Sit to supine: Supervision   General bed mobility comments: supervision for safety, increased time, effort and increased use of bed rail to get in and out of bed   Transfers                 General transfer comment: refuses due to R ankle pain         Balance Overall balance  assessment: Needs assistance Sitting-balance support: Feet supported Sitting balance-Leahy Scale: Good     Standing balance support: Bilateral upper extremity supported;During functional activity Standing balance-Leahy Scale: Fair Standing balance comment: requires rw and supervision/min guard for safety                            Cognition Arousal/Alertness: Awake/alert Behavior During Therapy: WFL for tasks assessed/performed Overall Cognitive Status: Within Functional Limits for tasks assessed                                        Exercises General Exercises - Lower Extremity Ankle Circles/Pumps: Seated;AROM;Both;10 reps (R ankle ROM limited and painful ) Gluteal Sets: AROM;Seated;10 reps Long Arc Quad: AROM;Both;10 reps;Seated Hip ABduction/ADduction: AROM;Both;10 reps;Seated Hip Flexion/Marching: AROM;Both;10 reps;Seated    General Comments  VSS on RA       Pertinent Vitals/Pain Pain Assessment: Faces Faces Pain Scale: Hurts even more Pain Location: B ankles, R>L Pain Descriptors / Indicators: Sore;Discomfort Pain Intervention(s): Limited activity within patient's tolerance;Monitored during session;Repositioned           PT Goals (current goals can now be found in the care plan section) Acute Rehab PT Goals Patient Stated Goal: to go home PT Goal Formulation: With patient Time For Goal Achievement: 03/12/20 Potential to Achieve Goals: Good Progress towards PT goals: Not progressing toward goals - comment (limited by R ankle pain )  Frequency    Min 3X/week      PT Plan Current plan remains appropriate       AM-PAC PT "6 Clicks" Mobility   Outcome Measure  Help needed turning from your back to your side while in a flat bed without using bedrails?: None Help needed moving from lying on your back to sitting on the side of a flat bed without using bedrails?: None Help needed moving to and from a bed to a chair (including a  wheelchair)?: None Help needed standing up from a chair using your arms (e.g., wheelchair or bedside chair)?: A Little Help needed to walk in hospital room?: A Little Help needed climbing 3-5 steps with a railing? : A Lot 6 Click Score: 20    End of Session Equipment Utilized During Treatment: Gait belt Activity Tolerance: Patient limited by pain Patient left: with call bell/phone within reach;in bed;with bed alarm set Nurse Communication: Mobility status PT Visit Diagnosis: Muscle weakness (generalized) (M62.81);Pain Pain - Right/Left:  (bilateral) Pain - part of body: Ankle and joints of foot     Time: 1423-1443 PT Time Calculation (min) (ACUTE ONLY): 20 min  Charges:  $Therapeutic Exercise: 8-22 mins                     Jackie Parker PT, DPT Acute Rehabilitation Services Pager [phone] Office [phone]    [place] 03/04/2020, 4:02 PM

## 2020-03-04 NOTE — Progress Notes (Signed)
   Daily Progress Note   Patient Name: Jackie Parker       Date: 03/04/2020 DOB: 02-10-1941  Age: 79 y.o. MRN#: [id number] Attending Physician: Georgette Shell, MD Primary Care Physician: [name], MD Admit Date: 02/24/2020  Reason for Consultation/Follow-up: Establishing goals of care  Subjective: Patient resting in bed. Granddaughter is at the bedside. Patient denies pain or shortness of breath. Plans for discharge home tomorrow with family and outpatient hospice support.   Completed DNR on chart to accompany patient home. Patient not interested in completing further advanced directives/MOST confirming that her daughter is her decision maker and knows her wishes, sons are all in agreement with this.   Support Given.   Length of Stay: 9 days  Vital Signs: BP (!) 113/59 (BP Location: Right Arm)   Pulse 80   Temp 97.6 F (36.4 C) (Oral)   Resp 18   Ht 5' 2"  (1.575 m)   Wt 113 kg Comment: scale A  SpO2 95%   BMI 45.58 kg/m  SpO2: SpO2: 95 % O2 Device: O2 Device: Nasal Cannula O2 Flow Rate: O2 Flow Rate (L/min): 2 L/min  Intake/output summary:   Intake/Output Summary (Last 24 hours) at 03/04/2020 1426 Last data filed at 03/04/2020 1300 Gross per 24 hour  Intake 120 ml  Output 250 ml  Net -130 ml   LBM:   Baseline Weight: Weight: 114.6 kg Most recent weight: Weight: 113 kg (scale A)  Physical Exam: -AAO, chronically-ill, NAD -normal breathing pattern       Palliative Care Assessment & Plan    Code Status:  DNR Goals of Care:  DNR/DNI-completed form on chart   Continue current plan of care per medical team. No escalation of care or aggressive interventions.   Patient expressed goals are to return home with family and enjoy what time she has left with no re-hospitalization.   Home with hospice Thursday.    Prognosis: < 6 months   Discharge Planning: Home with Hospice  Thank you for allowing the Palliative Medicine Team to assist in the care of  this patient.  Time Total: 20 min.   Visit consisted of counseling and education dealing with the complex and emotionally intense issues of symptom management and palliative care in the setting of serious and potentially life-threatening illness.Greater than 50%  of this time was spent counseling and coordinating care related to the above assessment and plan.  Alda Lea, AGPCNP-BC  Palliative Medicine Team [phone]

## 2020-03-04 NOTE — Progress Notes (Signed)
   03/04/20 0700  Vitals  ECG Heart Rate (!) 125  Cardiac Rhythm ST  Pain Assessment  Pain Scale 0-10  Pain Score 0  ECG Intervals  PR interval 0.14  QRS interval 0.12  QT interval 0.37  QTc interval 0.5  Glasgow Coma Scale  Eye Opening 4  Best Verbal Response (NON-intubated) 5  Modified Verbal Response (INTUBATED) 5  Best Motor Response 6  Glasgow Coma Scale Score (!) 20  MEWS Score  MEWS Temp 0  MEWS Systolic 0  MEWS Pulse 2  MEWS RR 0  MEWS LOC 0  MEWS Score 2  MEWS Score Color Yellow  Provider Notification  Provider Name/Title  (not notified this time vs retaken better.)  Rapid Response Notification  Name of Rapid Response RN Notified  (not notified vs retaken better.)  Note  Observations  (vs retaken better. no further intervention this time.)

## 2020-03-04 NOTE — Progress Notes (Signed)
K+ = 3.0. MD paged for supplementation. Orders received for Potassium 51m PO BID. Will continue to monitor.

## 2020-03-04 NOTE — Plan of Care (Signed)
  Problem: Education: Goal: Ability to demonstrate management of disease process will improve Outcome: Progressing Goal: Ability to verbalize understanding of medication therapies will improve Outcome: Progressing Goal: Individualized Educational Video(s) Outcome: Progressing   Problem: Activity: Goal: Capacity to carry out activities will improve Outcome: Progressing   Problem: Cardiac: Goal: Ability to achieve and maintain adequate cardiopulmonary perfusion will improve Outcome: Progressing   Problem: Education: Goal: Knowledge of General Education information will improve Description: Including pain rating scale, medication(s)/side effects and non-pharmacologic comfort measures Outcome: Progressing   Problem: Health Behavior/Discharge Planning: Goal: Ability to manage health-related needs will improve Outcome: Progressing   Problem: Clinical Measurements: Goal: Ability to maintain clinical measurements within normal limits will improve Outcome: Progressing Goal: Will remain free from infection Outcome: Progressing Goal: Diagnostic test results will improve Outcome: Progressing Goal: Respiratory complications will improve Outcome: Progressing Goal: Cardiovascular complication will be avoided Outcome: Progressing   Problem: Activity: Goal: Risk for activity intolerance will decrease Outcome: Progressing   Problem: Nutrition: Goal: Adequate nutrition will be maintained Outcome: Progressing   Problem: Elimination: Goal: Will not experience complications related to bowel motility Outcome: Progressing Goal: Will not experience complications related to urinary retention Outcome: Progressing   Problem: Safety: Goal: Ability to remain free from injury will improve Outcome: Progressing   Problem: Skin Integrity: Goal: Risk for impaired skin integrity will decrease Outcome: Progressing   Problem: Education: Goal: Knowledge of disease or condition will  improve Outcome: Progressing Goal: Understanding of medication regimen will improve Outcome: Progressing Goal: Individualized Educational Video(s) Outcome: Progressing   Problem: Activity: Goal: Ability to tolerate increased activity will improve Outcome: Progressing   Problem: Cardiac: Goal: Ability to achieve and maintain adequate cardiopulmonary perfusion will improve Outcome: Progressing   Problem: Health Behavior/Discharge Planning: Goal: Ability to safely manage health-related needs after discharge will improve Outcome: Progressing

## 2020-03-04 NOTE — Progress Notes (Signed)
PROGRESS NOTE    Jackie Parker  KTG:[id number] DOB: Jan 09, 1941 DOA: 02/24/2020 PCP: [name], MD  Brief Narrative:79 year old female with history of atrial fibrillation not on anticoagulation due to uterine bleeding, stage III CKD stage IIIb, chronic diastolic CHF admitted with acute on chronic CHF exacerbation. Patient was admitted to the hospital 02/14/2020 with the same she underwent right heart catheterization and echocardiogram at that time. She was adherent to her diuretics at home in spite of it patient developed shortness of breath and swelling. She also noticed new right arm swelling. She is admitted for acute on chronic diastolic CHF and AKI on CKD stage IIIb.  Assessment & Plan:   Principal Problem:   Acute on chronic diastolic CHF (congestive heart failure) (HCC) Active Problems:   Acute renal failure superimposed on stage 4 chronic kidney disease (HCC)   AF (paroxysmal atrial fibrillation) (HCC)   Acute kidney failure (HCC)   Acute on chronic congestive heart failure (HCC)   Goals of care, counseling/discussion   Advanced care planning/counseling discussion   #1 recurrent acute on chronic diastolic CHF-was followed by cardiology and nephrology.  No signed off.  Currently on Lasix 80 mg every 6 with Zaroxolyn 2.5 mg daily.  Renal recommends p.o. torsemide when switching to p.o. diuresis. Echo with normal systolic function. Cardiac cath earlier this month moderate pulmonary hypertension She remains fluid overloaded in spite of increasing Lasix and adding Zaroxolyn this is complicated by soft blood pressure. Appreciate palliative care input and there discussion with family today to decide on where to go from here. Patient does not want to do dialysis DNR/DNI Will await final decision to discharge home with palliative or hospice. I have discussed with her daughter in regards to discharging her home with hospice on Thursday.  #2 AKI on CKD stage IIIb to stage IV-her  baseline creatinine is around 2.  At the time of admission her creatinine was 5.2. Creatinine continues to trend up  to 6.16 At the time of discharge from the hospital after cardiac cath earlier this month her creatinine was 3.94. Renal ultrasoundno hydronephrosis renal cortical thinning suggesting mild atrophy. Cyst in the mid left kidney. Normal TSH. Potassium 3.0 was on Lokelma which has been stopped 03/01/2020.   #3 paroxysmal atrial fibrillation was on amiodarone and diltiazem.Diltiazem stopped today due to soft blood pressure. Not on anticoagulation due to fibroid bleeding.  Patient went into rapid A. fib yesterday while walking with physical therapy with rate coming down to the low 100s once she was at rest.  Discussed with cardiology recommended to increase amiodarone to 400 mg daily for 2 weeks then decrease to 200 mg daily. Amiodarone increased to 400 mg daily on 03/02/2020.  #4 abnormal UA since patient not symptomatic not started on antibiotics.  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  #7 bleeding fibroids on medroxyprogesterone continue. No bleeds reported from overnight staff    Estimated body mass index is 45.58 kg/m as calculated from the following:   Height as of this encounter: 5' 2"  (1.575 m).   Weight as of this encounter: 113 kg.  DVT prophylaxis:Subcu heparin  code Status:Full code Family Communication:dw daughter disposition Plan:Status is: Inpatient  Dispo:  Patient From: Home Planned Disposition: To be determined Expected discharge date: 03/05/20 Medically stable for discharge: Nopatient with fluid overload and worsening renal function on IV diuretics   Consultants:  Cardiology and nephrology  palliative  care  Procedures:None Antimicrobialsnone Subjective:  Patient resting in bed still thinking about whether to have dialysis or not to have dialysis she denies any shortness of breath or chest pain nausea or vomiting Objective: Vitals:   03/04/20 0356 03/04/20 0415 03/04/20 0832 03/04/20 1215  BP: (!) 139/58  (!) 106/54 (!) 113/59  Pulse: 78  76 80  Resp: 19  20 18   Temp: 98.6 F (37 C)  98.4 F (36.9 C) 97.6 F (36.4 C)  TempSrc: Oral  Oral Oral  SpO2: 90%  97% 95%  Weight:  113 kg    Height:        Intake/Output Summary (Last 24 hours) at 03/04/2020 1349 Last data filed at 03/04/2020 1300 Gross per 24 hour  Intake 120 ml  Output 250 ml  Net -130 ml   Filed Weights   03/02/20 0320 03/03/20 0333 03/04/20 0415  Weight: 114.4 kg 113.9 kg 113 kg    Examination:  General exam: Appears calm and comfortable  Respiratory system: diminished at bases to auscultation. Respiratory effort normal. Cardiovascular system: S1 & S2 heard, RRR. No JVD, murmurs, rubs, gallops or clicks. No pedal edema. Gastrointestinal system: Abdomen is nondistended, soft and nontender. No organomegaly or masses felt. Normal bowel sounds heard. Central nervous system: Alert and oriented. No focal neurological deficits. Extremities: 2plus edema chronic venous stasis changes Skin: No rashes, lesions or ulcers Psychiatry: Judgement and insight appear normal. Mood & affect appropriate.     Data Reviewed: I have personally reviewed following labs and imaging studies  CBC: No results for input(s): WBC, NEUTROABS, HGB, HCT, MCV, PLT in the last 168 hours. Basic Metabolic Panel: Recent Labs  Lab 02/29/20 0717 03/01/20 0600 03/02/20 0440 03/03/20 0546 03/04/20 0346  NA 137 135 137 136 137  K 3.9 3.3* 4.2 3.1* 3.0*  CL 103 101 102 99 98  CO2 24 23 21* 24 26  GLUCOSE 85 89 82 79 95  BUN 63* 66* 72* 73* 73*  CREATININE 6.44* 6.43* 6.42* 6.26* 6.16*  CALCIUM 8.3* 8.3* 8.3* 8.3* 8.5*  PHOS  --   5.7* 6.0* 5.3* 5.5*   GFR: Estimated Creatinine Clearance: 8.9 mL/min (A) (by C-G formula based on SCr of 6.16 mg/dL (H)). Liver Function Tests: Recent Labs  Lab 03/01/20 0600 03/02/20 0440 03/03/20 0546 03/04/20 0346  ALBUMIN 2.6* 2.6* 2.6* 2.6*   No results for input(s): LIPASE, AMYLASE in the last 168 hours. No results for input(s): AMMONIA in the last 168 hours. Coagulation Profile: No results for input(s): INR, PROTIME in the last 168 hours. Cardiac Enzymes: No results for input(s): CKTOTAL, CKMB, CKMBINDEX, TROPONINI in the last 168 hours. BNP (last 3 results) Recent Labs    12/02/19 1224  PROBNP 779*   HbA1C: No results for input(s): HGBA1C in the last 72 hours. CBG: Recent Labs  Lab 02/27/20 1618 02/27/20 2135  GLUCAP 120* 123*   Lipid Profile: No results for input(s): CHOL, HDL, LDLCALC, TRIG, CHOLHDL, LDLDIRECT in the last 72 hours. Thyroid Function Tests: No results for input(s): TSH, T4TOTAL, FREET4, T3FREE, THYROIDAB in the last 72 hours. Anemia Panel: No results for input(s): VITAMINB12, FOLATE, FERRITIN, TIBC, IRON, RETICCTPCT in the last 72 hours. Sepsis Labs: No results for input(s): PROCALCITON, LATICACIDVEN in the last 168 hours.  Recent Results (from the past 240 hour(s))  SARS Coronavirus 2 by RT PCR (hospital order, performed in [facility] hospital lab) Nasopharyngeal Nasopharyngeal Swab     Status: None   Collection Time: 02/24/20  10:55 PM   Specimen: Nasopharyngeal Swab  Result Value Ref Range Status   SARS Coronavirus 2 NEGATIVE NEGATIVE Final    Comment: (NOTE) SARS-CoV-2 target nucleic acids are NOT DETECTED.  The SARS-CoV-2 RNA is generally detectable in upper and lower respiratory specimens during the acute phase of infection. The lowest concentration of SARS-CoV-2 viral copies this assay can detect is 250 copies / mL. A negative result does not preclude SARS-CoV-2 infection and should not be used as the sole basis for treatment or  other patient management decisions.  A negative result may occur with improper specimen collection / handling, submission of specimen other than nasopharyngeal swab, presence of viral mutation(s) within the areas targeted by this assay, and inadequate number of viral copies (<250 copies / mL). A negative result must be combined with clinical observations, patient history, and epidemiological information.  Fact Sheet for Patients:   StrictlyIdeas.no  Fact Sheet for Healthcare Providers: BankingDealers.co.za  This test is not yet approved or  cleared by the [place] FDA and has been authorized for detection and/or diagnosis of SARS-CoV-2 by FDA under an Emergency Use Authorization (EUA).  This EUA will remain in effect (meaning this test can be used) for the duration of the COVID-19 declaration under Section 564(b)(1) of the Act, 21 U.S.C. section 360bbb-3(b)(1), unless the authorization is terminated or revoked sooner.  Performed at [facility], [place] [address], [place], [place] [address]          Radiology Studies: No results found.      Scheduled Meds: . amiodarone  400 mg Oral Daily  . brimonidine  1 drop Right Eye TID with meals  . brinzolamide  1 drop Right Eye TID  . furosemide  80 mg Intravenous Q6H  . heparin  5,000 Units Subcutaneous Q8H  . latanoprost  1 drop Both Eyes QHS  . medroxyPROGESTERone  20 mg Oral Daily  . metolazone  2.5 mg Oral Daily  . mometasone-formoterol  2 puff Inhalation BID  . potassium chloride  20 mEq Oral BID  . sodium chloride flush  3 mL Intravenous Q12H   Continuous Infusions: . sodium chloride       LOS: 9 days     Georgette Shell, MD  03/04/2020, 1:49 PM

## 2020-03-04 NOTE — Progress Notes (Signed)
Pt. is with medical provider at time of chaplain visit.  The chaplain will return. The Pt. son-William is bedside.

## 2020-03-04 NOTE — Progress Notes (Signed)
Episode of breakthrough AFib with mild RVR (max 120s) lasting <2 hours, asymptomatic. No change in treatment plan.

## 2020-03-05 LAB — RENAL FUNCTION PANEL
Albumin: 2.5 g/dL — ABNORMAL LOW (ref 3.5–5.0)
Anion gap: 14 (ref 5–15)
BUN: 75 mg/dL — ABNORMAL HIGH (ref 8–23)
CO2: 26 mmol/L (ref 22–32)
Calcium: 8.4 mg/dL — ABNORMAL LOW (ref 8.9–10.3)
Chloride: 98 mmol/L (ref 98–111)
Creatinine, Ser: 6.16 mg/dL — ABNORMAL HIGH (ref 0.44–1.00)
GFR calc Af Amer: 7 mL/min — ABNORMAL LOW (ref >60)
GFR calc non Af Amer: 6 mL/min — ABNORMAL LOW (ref >60)
Glucose, Bld: 90 mg/dL (ref 70–99)
Phosphorus: 4.9 mg/dL — ABNORMAL HIGH (ref 2.5–4.6)
Potassium: 3 mmol/L — ABNORMAL LOW (ref 3.5–5.1)
Sodium: 138 mmol/L (ref 135–145)

## 2020-03-05 MED ORDER — AMIODARONE HCL 200 MG PO TABS: 200.0000 mg | ORAL_TABLET | Freq: Two times a day (BID) | ORAL | Status: DC

## 2020-03-05 MED ORDER — AMIODARONE HCL 200 MG PO TABS: 200.0000 mg | ORAL_TABLET | Freq: Two times a day (BID) | ORAL | 3 refills | Status: AC

## 2020-03-05 MED ORDER — SODIUM CHLORIDE 0.9% FLUSH: 10.0000 mL | INTRAVENOUS | Status: DC | PRN

## 2020-03-05 MED ORDER — TRAMADOL HCL 50 MG PO TABS: 50.0000 mg | ORAL_TABLET | Freq: Four times a day (QID) | ORAL | 0 refills | Status: AC | PRN

## 2020-03-05 MED ORDER — TORSEMIDE 20 MG PO TABS: 40.0000 mg | ORAL_TABLET | Freq: Every day | ORAL | 1 refills | Status: AC

## 2020-03-05 MED ORDER — SODIUM CHLORIDE 0.9% FLUSH
10.0000 mL | Freq: Two times a day (BID) | INTRAVENOUS | Status: DC
  Administered 2020-03-05: 10 mL

## 2020-03-05 NOTE — Progress Notes (Signed)
Discharge instructions given to family and Ambulance crew patient discharged home with Hospice to be taken home via ambulance she is non ambulatory. Ambulance crew left building with patient on stretcher for home. PIV removed some bleeding noted after removal of midline,the catheter was intact. Son noted some bleeding on dressing reported had stopped was controlled patient was on heparin blood thinner she would bleed a little more even with pressure held to site. CCMD informed of discharge.

## 2020-03-05 NOTE — Discharge Summary (Signed)
Physician Discharge Summary  Jackie Parker YTK:[id number] DOB: 1941/03/28 DOA: 02/24/2020  PCP: [name], MD  Admit date: 02/24/2020 Discharge date: 03/05/2020  Admitted From: Home Disposition: Home with hospice  Recommendations for Outpatient Follow-up:  Follow up with PCP as needed  Home Health none Equipment/Devices: None Discharge Condition hospice discharge CODE STATUS DO NOT RESUSCITATE Diet recommendation regular diet Brief/Interim Summary:79 year old female with history of atrial fibrillation not on anticoagulation due to uterine bleeding, stage III CKD stage IIIb, chronic diastolic CHF admitted with acute on chronic CHF exacerbation. Patient was admitted to the hospital 02/14/2020 with the same she underwent right heart catheterization and echocardiogram at that time. She was adherent to her diuretics at home in spite of it patient developed shortness of breath and swelling. She also noticed new right arm swelling. She is admitted for acute on chronic diastolic CHF and AKI on CKD stage IIIb. Discharge Diagnoses:  Principal Problem:   Acute on chronic diastolic CHF (congestive heart failure) (HCC) Active Problems:   Acute renal failure superimposed on stage 4 chronic kidney disease (HCC)   AF (paroxysmal atrial fibrillation) (HCC)   Acute kidney failure (HCC)   Acute on chronic congestive heart failure (HCC)   Goals of care, counseling/discussion   Advanced care planning/counseling discussion  #1 recurrent acute on chronic diastolic CHF-patient was treated with maximum Lasix as tolerated with Zaroxolyn without much improvement.  Her renal function started to climb up and continued to trend up with decreased urine output.  She was seen by palliative care cardiology and nephrology during this hospital stay.  Long discussions with patient and family was done.   Patient came to the conclusion that she does not want dialysis and wants to be DNR/DNI.    #2 AKI on CKD stage  IIIb to stage IV-her baseline creatinine is around 2.  At the time of admission her creatinine was 5.2. Creatininecontinues to trend up to 6.16 At the time of discharge from the hospital after cardiac cath earlier this month her creatinine was 3.94. Renal ultrasoundno hydronephrosis renal cortical thinning suggesting mild atrophy. Cyst in the mid left kidney. Normal TSH. Patient will be discharged home today with hospice.  #3 paroxysmal atrial fibrillation continue amiodarone as tolerated.   #4history of ulcerative colitis and colectomy with a colostomy bag in place draining liquid stool  #4mobid obesity nutrition consult and follow recommendations.  #6 bleeding fibroids on medroxyprogesterone continue.   Estimated body mass index is 45.2 kg/m as calculated from the following:   Height as of this encounter: 5' 2"  (1.575 m).   Weight as of this encounter: 112.1 kg.  Discharge Instructions  Discharge Instructions    Diet - low sodium heart healthy   Complete by: As directed    Increase activity slowly   Complete by: As directed      Allergies as of 03/05/2020      Reactions   Megace Es [megestrol Acetate] Shortness Of Breath   Sulfonamide Derivatives Rash      Medication List    STOP taking these medications   allopurinol 100 MG tablet Commonly known as: ZYLOPRIM   colchicine 0.6 MG tablet   diltiazem 360 MG 24 hr capsule Commonly known as: Cardizem CD   furosemide 40 MG tablet Commonly known as: LASIX   potassium chloride 10 MEQ tablet Commonly known as: KLOR-CON   prochlorperazine 10 MG tablet Commonly known as: COMPAZINE     TAKE these medications   albuterol (2.5 MG/3ML) 0.083% nebulizer solution Commonly  known as: PROVENTIL Take 2.5 mg by nebulization 2 (two) times daily as needed for wheezing or shortness of breath.   ProAir HFA 108 (90 Base) MCG/ACT inhaler Generic drug: albuterol Inhale 1 puff into the lungs every 6 (six) hours as  needed for wheezing or shortness of breath.   amiodarone 200 MG tablet Commonly known as: PACERONE Take 1 tablet (200 mg total) by mouth daily.   brimonidine 0.2 % ophthalmic solution Commonly known as: ALPHAGAN Place 1 drop into the right eye 3 (three) times daily.   brinzolamide 1 % ophthalmic suspension Commonly known as: AZOPT Place 1 drop into the right eye 3 (three) times daily.   fluocinonide ointment 0.05 % Commonly known as: LIDEX Apply 1 application topically 2 (two) times daily as needed for itching.   Fluticasone-Salmeterol 500-50 MCG/DOSE Aepb Commonly known as: Advair Diskus Inhale 1 puff into the lungs in the morning and at bedtime.   Lumigan 0.01 % Soln Generic drug: bimatoprost Place 1 drop into both eyes at bedtime.   medroxyPROGESTERone 10 MG tablet Commonly known as: PROVERA Take 2 tablets (20 mg total) by mouth daily.   torsemide 20 MG tablet Commonly known as: DEMADEX Take 2 tablets (40 mg total) by mouth daily.   traMADol 50 MG tablet Commonly known as: ULTRAM Take 1 tablet (50 mg total) by mouth every 6 (six) hours as needed for severe pain.       Follow-up Information    [name], MD Follow up.   Specialty: Family Medicine Contact information: [place] [address] [phone]        Belva Crome, MD .   Specialty: Cardiology Contact information: [phone] [address] [place] [address] [phone]        Thompson Grayer, MD .   Specialty: Cardiology Contact information: [address] [place] [address] [phone]              Allergies  Allergen Reactions  . Megace Es [Megestrol Acetate] Shortness Of Breath  . Sulfonamide Derivatives Rash    Consultations: Palliative care, cardiology, nephrology   Procedures/Studies: CARDIAC CATHETERIZATION  Result Date: 02/11/2020  Hemodynamic findings consistent with moderate pulmonary hypertension.  1. Moderate  pulmonary venous hypertension. Mean PAP 40 mm Hg 2. Elevated LV filling pressures. Mean PCWP 30 mm Hg with prominent V wave to 54 mm Hg 3. Good cardiac output. Index 4.95. Plan: continue IV diuresis.   US RENAL  Result Date: 02/26/2020 CLINICAL DATA:  Acute kidney injury EXAM: RENAL / URINARY TRACT ULTRASOUND COMPLETE COMPARISON:  CT 01/22/2020 FINDINGS: Right Kidney: Renal measurements: 8 x 5.2 x 4.4 cm = volume: 96.5 mL. Renal cortical thinning. No hydronephrosis or mass. Left Kidney: Renal measurements: 8.8 x 5.1 x 5.2 cm = volume: 120.8 mL. Renal cortical thinning. No hydronephrosis. Cyst in the midpole measuring 2.4 cm. Bladder: Not well visualized and likely empty. Other: None. IMPRESSION: 1. Renal cortical thinning suggesting mild atrophy. No hydronephrosis. 2. Cyst in the mid left kidney Electronically Signed   By: Donavan Foil M.D.   On: 02/26/2020 17:33   DG Chest Port 1 View  Result Date: 02/24/2020 CLINICAL DATA:  Shortness of breath EXAM: PORTABLE CHEST 1 VIEW COMPARISON:  02/04/2020 FINDINGS: Mild cardiomegaly with vascular congestion. Mild diffuse interstitial and ground-glass opacity. Suspected small left effusion. No pneumothorax. IMPRESSION: 1. Cardiomegaly with vascular congestion and mild diffuse interstitial and ground-glass opacities suggestive of edema 2. Suspected small left effusion  Electronically Signed   By: Donavan Foil M.D.   On: 02/24/2020 19:19   ECHOCARDIOGRAM COMPLETE  Result Date: 02/05/2020    ECHOCARDIOGRAM REPORT   Patient Name:   Jackie Parker Date of Exam: 02/05/2020 Medical Rec #:  [id number]        Height:       62.0 in Accession #:    [id number]       Weight:       250.3 lb Date of Birth:  October 02, 1940        BSA:          2.103 m Patient Age:    15 years         BP:           127/66 mmHg Patient Gender: F                HR:           88 bpm. Exam Location:  Inpatient Procedure: 2D Echo, Cardiac Doppler and Color Doppler Indications:    CHF-Acute Diastolic  350.09 / F81.82  History:        Patient has prior history of Echocardiogram examinations, most                 recent 08/02/2019. CHF, Arrythmias:PVC, PAC and Atrial                 Fibrillation; Risk Factors:Sleep Apnea, Hypertension and                 Non-Smoker.  Sonographer:    Vickie Epley RDCS Referring Phys: [place]  1. Left ventricular ejection fraction, by estimation, is 60 to 65%. The left ventricle has normal function. The left ventricle has no regional wall motion abnormalities. Left ventricular diastolic function could not be evaluated.  2. Right ventricular systolic function is normal. The right ventricular size is normal. There is mildly elevated pulmonary artery systolic pressure. The estimated right ventricular systolic pressure is 99.3 mmHg.  3. The mitral valve is degenerative. Trivial mitral valve regurgitation. No evidence of mitral stenosis.  4. The aortic valve is tricuspid. Aortic valve regurgitation is not visualized. Mild aortic valve stenosis. Aortic valve area, by VTI measures 1.61 cm. Aortic valve mean gradient measures 12.6 mmHg. Aortic valve Vmax measures 2.53 m/s.  5. The inferior vena cava is dilated in size with >50% respiratory variability, suggesting right atrial pressure of 8 mmHg. Comparison(s): No significant change from prior study. EF ~60%. Mild to moderate RVSP 45 mmHG on this study. FINDINGS  Left Ventricle: Left ventricular ejection fraction, by estimation, is 60 to 65%. The left ventricle has normal function. The left ventricle has no regional wall motion abnormalities. The left ventricular internal cavity size was normal in size. There is  no left ventricular hypertrophy. Left ventricular diastolic function could not be evaluated due to atrial fibrillation. Left ventricular diastolic function could not be evaluated. Right Ventricle: The right ventricular size is normal. No increase in right ventricular wall thickness. Right ventricular systolic  function is normal. There is mildly elevated pulmonary artery systolic pressure. The tricuspid regurgitant velocity is 3.05  m/s, and with an assumed right atrial pressure of 8 mmHg, the estimated right ventricular systolic pressure is 71.6 mmHg. Left Atrium: Left atrial size was normal in size. Right Atrium: Right atrial size was normal in size. Pericardium: Trivial pericardial effusion is present. Mitral Valve: The mitral valve is degenerative in appearance. There is mild thickening of  the mitral valve leaflet(s). There is mild calcification of the mitral valve leaflet(s). Mild mitral annular calcification. Trivial mitral valve regurgitation. No evidence of mitral valve stenosis. Tricuspid Valve: The tricuspid valve is grossly normal. Tricuspid valve regurgitation is mild . No evidence of tricuspid stenosis. Aortic Valve: The aortic valve is tricuspid. Aortic valve regurgitation is not visualized. Mild aortic stenosis is present. Aortic valve mean gradient measures 12.6 mmHg. Aortic valve peak gradient measures 25.7 mmHg. Aortic valve area, by VTI measures 1.61 cm. Pulmonic Valve: The pulmonic valve was grossly normal. Pulmonic valve regurgitation is trivial. No evidence of pulmonic stenosis. Aorta: The aortic root is normal in size and structure. Venous: The inferior vena cava is dilated in size with greater than 50% respiratory variability, suggesting right atrial pressure of 8 mmHg. IAS/Shunts: The atrial septum is grossly normal.  LEFT VENTRICLE PLAX 2D LVIDd:         5.46 cm LVIDs:         3.80 cm LV PW:         0.66 cm LV IVS:        0.64 cm LVOT diam:     2.00 cm LV SV:         71 LV SV Index:   34 LVOT Area:     3.14 cm  LV Volumes (MOD) LV vol d, MOD A2C: 110.0 ml LV vol d, MOD A4C: 112.0 ml LV vol s, MOD A2C: 50.9 ml LV vol s, MOD A4C: 44.3 ml LV SV MOD A2C:     59.1 ml LV SV MOD A4C:     112.0 ml LV SV MOD BP:      63.3 ml RIGHT VENTRICLE TAPSE (M-mode): 1.8 cm LEFT ATRIUM             Index        RIGHT ATRIUM           Index LA diam:        3.80 cm 1.81 cm/m  RA Area:     12.60 cm LA Vol (A2C):   37.9 ml 18.02 ml/m RA Volume:   26.80 ml  12.74 ml/m LA Vol (A4C):   22.4 ml 10.65 ml/m LA Biplane Vol: 31.6 ml 15.03 ml/m  AORTIC VALVE AV Area (Vmax):    1.51 cm AV Area (Vmean):   1.76 cm AV Area (VTI):     1.61 cm AV Vmax:           253.48 cm/s AV Vmean:          166.083 cm/s AV VTI:            0.442 m AV Peak Grad:      25.7 mmHg AV Mean Grad:      12.6 mmHg LVOT Vmax:         122.00 cm/s LVOT Vmean:        93.300 cm/s LVOT VTI:          0.227 m LVOT/AV VTI ratio: 0.51  AORTA Ao Root diam: 2.70 cm MR Peak grad: 77.1 mmHg   TRICUSPID VALVE MR Vmax:      439.00 cm/s TR Peak grad:   37.2 mmHg                           TR Vmax:        305.00 cm/s  SHUNTS                           Systemic VTI:  0.23 m                           Systemic Diam: 2.00 cm Eleonore Chiquito MD Electronically signed by Eleonore Chiquito MD Signature Date/Time: 02/05/2020/12:38:02 PM    Final    DG HIPS BILAT WITH PELVIS 2V  Result Date: 02/26/2020 CLINICAL DATA:  Bilateral hip pain for 1 day EXAM: DG HIP (WITH OR WITHOUT PELVIS) 2V BILAT COMPARISON:  01/22/2020 FINDINGS: Frontal and frogleg lateral views of the bilateral hips are obtained. There is bilateral hip osteoarthritis, right greater than left. No fracture, subluxation, or dislocation. Postsurgical changes at L4-5. Prominent spondylosis at the lumbosacral junction. The remainder of the bony pelvis is unremarkable. IMPRESSION: 1. Stable bilateral hip osteoarthritis, right greater than left. 2. No acute bony abnormality. Electronically Signed   By: Randa Ngo M.D.   On: 02/26/2020 15:48   VAS [place] UPPER EXTREMITY VENOUS DUPLEX  Result Date: 02/26/2020 UPPER VENOUS STUDY  Indications: Swelling Comparison Study: No prior study Performing Technologist: Maudry Mayhew MHA, RDMS, RVT, RDCS  Examination Guidelines: A complete evaluation includes  B-mode imaging, spectral Doppler, color Doppler, and power Doppler as needed of all accessible portions of each vessel. Bilateral testing is considered an integral part of a complete examination. Limited examinations for reoccurring indications may be performed as noted.  Right Findings: +----------+------------+---------+-----------+----------+--------------+ RIGHT     CompressiblePhasicitySpontaneousProperties   Summary     +----------+------------+---------+-----------+----------+--------------+ IJV         Partial   Pulsatile    Yes                             +----------+------------+---------+-----------+----------+--------------+ Subclavian    Full    Pulsatile    Yes                             +----------+------------+---------+-----------+----------+--------------+ Axillary      Full    Pulsatile    Yes                             +----------+------------+---------+-----------+----------+--------------+ Brachial      Full    Pulsatile    Yes                             +----------+------------+---------+-----------+----------+--------------+ Radial        Full                                                 +----------+------------+---------+-----------+----------+--------------+ Ulnar         Full                                                 +----------+------------+---------+-----------+----------+--------------+ Cephalic      Full                                                 +----------+------------+---------+-----------+----------+--------------+  Basilic                                             Not visualized +----------+------------+---------+-----------+----------+--------------+  Left Findings: +----------+------------+---------+-----------+----------+-------+ LEFT      CompressiblePhasicitySpontaneousPropertiesSummary +----------+------------+---------+-----------+----------+-------+ Subclavian               Yes        Yes                      +----------+------------+---------+-----------+----------+-------+  Summary:  Right: No evidence of deep vein thrombosis in the upper extremity. No evidence of superficial vein thrombosis in the upper extremity.  Left: No evidence of thrombosis in the subclavian.  *See table(s) above for measurements and observations.  Diagnosing physician: Harold Barban MD Electronically signed by Harold Barban MD on 02/26/2020 at 11:21:11 PM.    Final     (Echo, Carotid, EGD, Colonoscopy, ERCP)    Subjective:  Patient resting in bed agrees and anxious to go home today Discharge Exam: Vitals:   03/05/20 0527 03/05/20 0752  BP: (!) 115/51   Pulse: 81   Resp: 20   Temp: 98.8 F (37.1 C)   SpO2: 93% 99%   Vitals:   03/04/20 2055 03/05/20 0016 03/05/20 0527 03/05/20 0752  BP:  (!) 106/55 (!) 115/51   Pulse:  80 81   Resp:  20 20   Temp:   98.8 F (37.1 C)   TempSrc:   Oral   SpO2: 92% 93% 93% 99%  Weight:  112.1 kg    Height:        General: Pt is alert, awake, not in acute distress Cardiovascular: RRR, S1/S2 +, no rubs, no gallops Respiratory: Decreased breath sounds at the bases bilaterally, no wheezing, no rhonchi Abdominal: Soft, NT, ND, bowel sounds + Extremities: 2+ bilateral pitting edema    The results of significant diagnostics from this hospitalization (including imaging, microbiology, ancillary and laboratory) are listed below for reference.     Microbiology: Recent Results (from the past 240 hour(s))  SARS Coronavirus 2 by RT PCR (hospital order, performed in [facility] hospital lab) Nasopharyngeal Nasopharyngeal Swab     Status: None   Collection Time: 02/24/20 10:55 PM   Specimen: Nasopharyngeal Swab  Result Value Ref Range Status   SARS Coronavirus 2 NEGATIVE NEGATIVE Final    Comment: (NOTE) SARS-CoV-2 target nucleic acids are NOT DETECTED.  The SARS-CoV-2 RNA is generally detectable in upper and lower respiratory specimens during the  acute phase of infection. The lowest concentration of SARS-CoV-2 viral copies this assay can detect is 250 copies / mL. A negative result does not preclude SARS-CoV-2 infection and should not be used as the sole basis for treatment or other patient management decisions.  A negative result may occur with improper specimen collection / handling, submission of specimen other than nasopharyngeal swab, presence of viral mutation(s) within the areas targeted by this assay, and inadequate number of viral copies (<250 copies / mL). A negative result must be combined with clinical observations, patient history, and epidemiological information.  Fact Sheet for Patients:   StrictlyIdeas.no  Fact Sheet for Healthcare Providers: BankingDealers.co.za  This test is not yet approved or  cleared by the [place] FDA and has been authorized for detection and/or diagnosis of SARS-CoV-2 by FDA under an Emergency Use Authorization (EUA).  This EUA will remain  in effect (meaning this test can be used) for the duration of the COVID-19 declaration under Section 564(b)(1) of the Act, 21 U.S.C. section 360bbb-3(b)(1), unless the authorization is terminated or revoked sooner.  Performed at [facility], [place] [address], [place], [place] [address]      Labs: BNP (last 3 results) Recent Labs    08/01/19 1144 02/03/20 1844 02/24/20 1945  BNP 209.8* 222.9* 419.6*   Basic Metabolic Panel: Recent Labs  Lab 03/01/20 0600 03/02/20 0440 03/03/20 0546 03/04/20 0346 03/05/20 0538  NA 135 137 136 137 138  K 3.3* 4.2 3.1* 3.0* 3.0*  CL 101 102 99 98 98  CO2 23 21* 24 26 26   GLUCOSE 89 82 79 95 90  BUN 66* 72* 73* 73* 75*  CREATININE 6.43* 6.42* 6.26* 6.16* 6.16*  CALCIUM 8.3* 8.3* 8.3* 8.5* 8.4*  PHOS 5.7* 6.0* 5.3* 5.5* 4.9*   Liver Function Tests: Recent Labs  Lab 03/01/20 0600 03/02/20 0440 03/03/20 0546 03/04/20 0346 03/05/20 0538   ALBUMIN 2.6* 2.6* 2.6* 2.6* 2.5*   No results for input(s): LIPASE, AMYLASE in the last 168 hours. No results for input(s): AMMONIA in the last 168 hours. CBC: No results for input(s): WBC, NEUTROABS, HGB, HCT, MCV, PLT in the last 168 hours. Cardiac Enzymes: No results for input(s): CKTOTAL, CKMB, CKMBINDEX, TROPONINI in the last 168 hours. BNP: Invalid input(s): POCBNP CBG: Recent Labs  Lab 02/27/20 1618 02/27/20 2135  GLUCAP 120* 123*   D-Dimer No results for input(s): DDIMER in the last 72 hours. Hgb A1c No results for input(s): HGBA1C in the last 72 hours. Lipid Profile No results for input(s): CHOL, HDL, LDLCALC, TRIG, CHOLHDL, LDLDIRECT in the last 72 hours. Thyroid function studies No results for input(s): TSH, T4TOTAL, T3FREE, THYROIDAB in the last 72 hours.  Invalid input(s): FREET3 Anemia work up No results for input(s): VITAMINB12, FOLATE, FERRITIN, TIBC, IRON, RETICCTPCT in the last 72 hours. Urinalysis    Component Value Date/Time   COLORURINE AMBER (A) 02/24/2020 2315   APPEARANCEUR CLOUDY (A) 02/24/2020 2315   APPEARANCEUR Cloudy (A) 11/08/2016 1534   LABSPEC 1.017 02/24/2020 2315   PHURINE 5.0 02/24/2020 2315   GLUCOSEU NEGATIVE 02/24/2020 2315   HGBUR LARGE (A) 02/24/2020 2315   HGBUR negative 02/18/2010 0856   BILIRUBINUR NEGATIVE 02/24/2020 2315   BILIRUBINUR Negative 11/08/2016 1534   KETONESUR NEGATIVE 02/24/2020 2315   PROTEINUR >=300 (A) 02/24/2020 2315   UROBILINOGEN 1.0 08/30/2013 0901   NITRITE NEGATIVE 02/24/2020 2315   LEUKOCYTESUR LARGE (A) 02/24/2020 2315   Sepsis Labs Invalid input(s): PROCALCITONIN,  WBC,  LACTICIDVEN Microbiology Recent Results (from the past 240 hour(s))  SARS Coronavirus 2 by RT PCR (hospital order, performed in [facility] hospital lab) Nasopharyngeal Nasopharyngeal Swab     Status: None   Collection Time: 02/24/20 10:55 PM   Specimen: Nasopharyngeal Swab  Result Value Ref Range Status   SARS Coronavirus 2  NEGATIVE NEGATIVE Final    Comment: (NOTE) SARS-CoV-2 target nucleic acids are NOT DETECTED.  The SARS-CoV-2 RNA is generally detectable in upper and lower respiratory specimens during the acute phase of infection. The lowest concentration of SARS-CoV-2 viral copies this assay can detect is 250 copies / mL. A negative result does not preclude SARS-CoV-2 infection and should not be used as the sole basis for treatment or other patient management decisions.  A negative result may occur with improper specimen collection / handling, submission of specimen other than nasopharyngeal swab, presence of viral mutation(s)  within the areas targeted by this assay, and inadequate number of viral copies (<250 copies / mL). A negative result must be combined with clinical observations, patient history, and epidemiological information.  Fact Sheet for Patients:   StrictlyIdeas.no  Fact Sheet for Healthcare Providers: BankingDealers.co.za  This test is not yet approved or  cleared by the [place] FDA and has been authorized for detection and/or diagnosis of SARS-CoV-2 by FDA under an Emergency Use Authorization (EUA).  This EUA will remain in effect (meaning this test can be used) for the duration of the COVID-19 declaration under Section 564(b)(1) of the Act, 21 U.S.C. section 360bbb-3(b)(1), unless the authorization is terminated or revoked sooner.  Performed at [facility], [place] [address], [place], [place] [address]      Time coordinating discharge:  39 minutes  SIGNED:   Georgette Shell, MD  [facility] 03/05/2020, 10:09 AM Pager   If 7PM-7AM, please contact night-coverage [email or web address] Password [email or web address]

## 2020-03-05 NOTE — TOC Transition Note (Signed)
Transition of Care [facility]) - CM/SW Discharge Note   Patient Details  Name: Helon Wisinski MRN: [id number] Date of Birth: [birth year]  Transition of Care [facility]) CM/SW Contact:  Zenon Mayo, RN Phone Number: 03/05/2020, 10:43 AM   Clinical Narrative:    Patient is for dc today, home with Hospice, ptar has been called. Maryann with [facility] has been notified that ptar has been called.    Final next level of care: Home w Hospice Care Barriers to Discharge: No Barriers Identified   Patient Goals and CMS Choice Patient states their goals for this hospitalization and ongoing recovery are:: home with hospice CMS Medicare.gov Compare Post Acute Care list provided to:: Patient Choice offered to / list presented to : Patient, Adult Children  Discharge Placement                       Discharge Plan and Services   Discharge Planning Services: CM Consult Post Acute Care Choice: Hospice          DME Arranged: Hospital bed ([facility] will supply DME)   Date DME Agency Contacted: 03/03/20 Time DME Agency Contacted: [phone] Representative spoke with at DME Agency: [place]: RN [place] Agency:  Lonia Chimera) Date [place]: 03/03/20 Time [place]: 1615 Representative spoke with at [place]: [place] ([place]) Interventions     Readmission Risk Interventions Readmission Risk Prevention Plan 03/03/2020  Transportation Screening Complete  PCP or Specialist Appt within 3-5 Days Complete  HRI or [place] Complete  Social Work Consult for [place] Planning/Counseling Complete  Palliative Care Screening Complete  Medication Review Press photographer) Complete  Some recent data might be hidden

## 2020-03-05 NOTE — Progress Notes (Signed)
Similar pattern of early morning paroxysmal atrial fibrillation lasting <2h, mild RVR. Will try to see if giving half the amiodarone dose in the evening helps. Suspect will decrease in prevalence as the slow acting amiodarone "kicks in".

## 2020-03-06 ENCOUNTER — Telehealth: Payer: Self-pay

## 2020-03-06 NOTE — Telephone Encounter (Signed)
Transition Care Management Follow-up Telephone Call Date of discharge and from where: 03/05/2020  [facility]  SPOKE WITH DAUGHTER RENE /SIGNED DPR ON FILE  How have you been since you were released from the hospital? [facility] Any questions or concerns? None  Items Reviewed: Did the pt receive and understand the discharge instructions provided? YES Medications obtained and verified? YES  Any new allergies since your discharge? NONE Dietary orders reviewed? Yes  Do you have support at home? Childrens   Functional Questionnaire: (I = Independent and D = Dependent) ADLs: with assistance when needed  Follow up appointments reviewed:  PCP Hospital f/u appt confirmed?  Scheduled to see Dr Margarita Rana on 03/17/2020    Specialist Hospital f/u appt confirmed?[facility] on 03/23/2020 Are transportation arrangements needed? NO  If their condition worsens, /is the pt aware to call PCP or go to the Emergency Dept.?  Pt is aware if condition is worsening or start experiencing any of diff breathing, SOB, dizziness, slurred speech, chest pain, extreme fatigue,  Persistent nausea and vomiting, bleeding , rapid weight gain, severe uncontrolled pain, or visual disturbances to return to ED  Was the patient provided with contact information for the PCP's office or ED? YES given.  Was to pt encouraged to call back with questions or concerns?YES name and contact information .  Discharge Planning Services: CM Consult Post Acute Care Choice: Hospice          DME Arranged: Hospital bed ([facility] will supply DME) Date DME Agency Contacted: 03/03/20 Time DME Agency Contacted: [phone] Representative spoke with at DME Agency: [place]: RN [place] Agency:  Lonia Chimera) Date [place]: 03/03/20 Time [place]: [phone] Representative spoke with at [place]: Velta Addison

## 2020-03-09 ENCOUNTER — Telehealth: Payer: Self-pay

## 2020-03-09 NOTE — Telephone Encounter (Signed)
Verbal orders were given for foley to be put in place for fluid retention.  Hospice nurse Celeste called.

## 2020-03-10 ENCOUNTER — Telehealth: Payer: Self-pay | Admitting: Family Medicine

## 2020-03-10 MED ORDER — CEPHALEXIN 500 MG PO CAPS
500.0000 mg | ORAL_CAPSULE | Freq: Every day | ORAL | 0 refills | Status: AC
Start: 2020-03-10 — End: ?

## 2020-03-10 NOTE — Telephone Encounter (Signed)
Nurse was called and informed that antibiotic has been called into pharmacy.  Nurse states that the patient was having pain due to the foley being placed and she states that once it was removed she has no pain now, but states that she will benefit from the antibiotic.

## 2020-03-10 NOTE — Telephone Encounter (Signed)
If she has urinary retention and pain she needs to be sent to the ED as this patient has stage V chronic kidney disease.  I have sent a prescription for an antibiotic to her pharmacy in the event that she has a UTI.

## 2020-03-10 NOTE — Telephone Encounter (Signed)
Will route to PCP for review.  Nurse states that patient may have UTI

## 2020-03-10 NOTE — Telephone Encounter (Signed)
Patients hospice nurse called in to inform pcp that the patient has been complaining of urine retention and pain all weekend. Nurse stated that yesterday she(the nurse) noticed that the foley was almost completely out, in turn the nurse took the foley out. Nurse stated that the patients urine has been very cloudy as well as having a fowl smelling odor. Nurse stated that she suspects an UTI. Nurse stated that the patient requested for an external catheter instead. Please follow up at your earliest coineveninece. Please follow up at your earliest convenience.

## 2020-03-14 NOTE — Progress Notes (Deleted)
Cardiology Office Note   Date:  03/14/2020   ID:  Jackie Parker, DOB [birth year], MRN [id number]  PCP:  [name], MD  Cardiologist:  Dr. Tamala Julian, MD    No chief complaint on file.   History of Present Illness: Jackie Parker is a 79 y.o. female who presents for hospital follow up.   Jackie Parker has a history of persistent afib on amiodarone and no a/c due to uterine bleeding (followed by OBGYN), HTN, ulcerative colitis s/p colectomy 1977, OA s/p TKR, chronic hypokalemia, psoriasis, asthma.  She was seen 02/03/2020 and an office visit and felt to be fluid volume overloaded with reports of weight gain of at least 50 pounds since 07/2019, dyspnea and JVD.  On admission the patient was started on IV lasix 60 mg BID.  Patient remained in normal sinus rhythm with continuation of amiodarone and no anticoagulation due to bleeding risk however patient reported since uterine bleeding stopped she might be able to start anticoagulation. Diltiazem was continued for rate control and blood pressure. BNP found to be elevated to 222. CXR showed slight left base atelectasis. Echo was repeated which showed LVEF 60-65%, normal RV function, mildly elevated pulmonary artery systolic pressure estimated at 30mHg, degenerative MV, trivial TR, mild AS, mean gradient 12.526mg, dilated IVC. Creatinine was up around 3 with a baseline 1.7-2. Also the fact that BNP up only to 222 suggested renal failure was contributing to fluid overload. Nephrology was consulted for AKI on CKD and volume overload. CT from 5/12 was reviewed which showed possible uterine mass and simple cyst, normal kidneys without obstruction. Continued diuresis was recommended by nephrology. Patient had good urine output however was still very volume overloaded and Lasix was increased to 12019mID. Urine output was low so lasix was increased again to 160m39mD and metolazone was added. Creatinine started improving. Patient was set up for RHC and  diuretic was held. On 6/1 she underwent RHC which showed moderate pulmonary venous HTN, mean PAP 40 mmHg, elevated filling pressures, mean PCWP 30 mmHg with prominent V wave to 54 mmHg, good cardiac output. Plan was to switch to oral diuretic post cath but given elevated EDP she was given IV lasix 40mg20m. Overall the patient put out 9L urine. Weights did not appear to be accurately documented (admit weight recorded at 253lbs and discharge weight at 251lbs) . Low salt diet and fluid restriction was discussed. Will plan to discharge the patient on Lasix 40 mg BID with follow-up BMET in 4 days.   She was once again admitted 02/24/2020 with recurrent acute on chronic diastolic CHF exacerbation.  She was treated with maximum Lasix as tolerated with Zaroxolyn without much improvement.  Again, renal function began to climb.  She was seen by palliative care, cardiology and nephrology with long discussions with patient and family at which time plans were not to pursue dialysis and that she wished for DNR/DNI status.    On hospital discharge, diltiazem 360 was discontinued along with Lasix 40 mg with plans to continue 200 mg p.o. daily along with torsemide 40 mg  1.  Acute on chronic CHF:    2.  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 -Palliative care during hospital course with no plans to pursue dialysis treatment -Renal ultrasound with now hydronephrosis, renal cortical thinning suggesting mild atrophy with mid left kidney cyst -She was ultimately  discharged home with hospice care  3.  Persistent atrial fibrillation: -Will increase the amiodarone to 400 mg daily for 2 weeks, then decrease back to the 200 mg daily dose. Additional arrhythmia breakthrough episodes are to be expected, but would not change the overall treatment plans unless the arhythmia becomes persistent.Continue amiodarone Avoiding beta  blockers due to asthma (on LABA) and BP precludes higher diltiazem doses. Not on anticoagulants due to uterine bleeding  4.  History of ulcerative colitis s/p colectomy with colostomy bag:    Will increase the amiodarone to 400 mg daily for 2 weeks, then decrease back to the 200 mg daily dose. Additional arrhythmia breakthrough episodes are to be expected, but would not change the overall treatment plans unless the arhythmia becomes persistent.  Past Medical History:  Diagnosis Date  . AKI (acute kidney injury) ([place]) 12/2015  . Allergy    takes Singulair and Zyrtec daily  . Asthma    uses Adair daily  . Cataracts, bilateral   . CHF (congestive heart failure) ([place])   . Colitis, ulcerative ([place])   . DJD (degenerative joint disease)   . Glaucoma    uses eye drops daily  . History of blood transfusion    no abnormal reaction noted. 40+ yrs ago  . History of bronchitis    yrs ago  . History of gout    not on any meds  . Hypertension    takes Amlodipine daily  . Joint pain   . Joint swelling   . OSA (obstructive sleep apnea) 12/17/2019  . Osteoarthritis   . PMB (postmenopausal bleeding)   . Psoriasis   . Urinary frequency    takes Ditropan daily  . Vertigo    doesn't take any meds  . Vitamin D deficiency     Past Surgical History:  Procedure Laterality Date  . BACK SURGERY    . COLONOSCOPY    . DILATATION & CURETTAGE/HYSTEROSCOPY WITH MYOSURE N/A 08/05/2019   Procedure: DILATATION & CURETTAGE/HYSTEROSCOPY WITH MYOSURE;  Surgeon: Christophe Louis, MD;  Location: [place];  Service: Gynecology;  Laterality: N/A;  Myosure rep will be here confirmed on 08/01/19 CS  . ILEOSTOMY    . KNEE ARTHROPLASTY Left 12/14/2015   Procedure: COMPUTER ASSISTED LEFT TOTAL KNEE ARTHROPLASTY;  Surgeon: Jessy Oto, MD;  Location: [place];  Service: Orthopedics;  Laterality: Left;  . RIGHT HEART CATH N/A 02/11/2020   Procedure: RIGHT HEART CATH;  Surgeon: Martinique, Peter M, MD;  Location: [facility];   Service: Cardiovascular;  Laterality: N/A;  . TOTAL COLECTOMY      total colectomy in 1977  . TOTAL KNEE ARTHROPLASTY Left 12/14/2015  . TOTAL KNEE ARTHROPLASTY Right 06/27/2016  . TOTAL KNEE ARTHROPLASTY Right 06/27/2016   Procedure: RIGHT TOTAL KNEE ARTHROPLASTY;  Surgeon: Jessy Oto, MD;  Location: [place] [facility];  Service: Orthopedics;  Laterality: Right;  . TUBAL LIGATION       Current Outpatient Medications  Medication Sig Dispense Refill  . albuterol (PROAIR HFA) 108 (90 Base) MCG/ACT inhaler Inhale 1 puff into the lungs every 6 (six) hours as needed for wheezing or shortness of breath.    Marland Kitchen albuterol (PROVENTIL) (2.5 MG/3ML) 0.083% nebulizer solution Take 2.5 mg by nebulization 2 (two) times daily as needed for wheezing or shortness of breath.     Marland Kitchen amiodarone (PACERONE) 200 MG tablet Take 1 tablet (200 mg total) by mouth 2 (two) times daily. 60 tablet 3  . brimonidine (ALPHAGAN) 0.2 % ophthalmic  solution Place 1 drop into the right eye 3 (three) times daily.    . brinzolamide (AZOPT) 1 % ophthalmic suspension Place 1 drop into the right eye 3 (three) times daily.     . cephALEXin (KEFLEX) 500 MG capsule Take 1 capsule (500 mg total) by mouth daily. 7 capsule 0  . fluocinonide ointment (LIDEX) 2.84 % Apply 1 application topically 2 (two) times daily as needed for itching.    . Fluticasone-Salmeterol (ADVAIR DISKUS) 500-50 MCG/DOSE AEPB Inhale 1 puff into the lungs in the morning and at bedtime. 60 each 2  . LUMIGAN 0.01 % SOLN Place 1 drop into both eyes at bedtime.    . medroxyPROGESTERone (PROVERA) 10 MG tablet Take 2 tablets (20 mg total) by mouth daily. 360 tablet 1  . torsemide (DEMADEX) 20 MG tablet Take 2 tablets (40 mg total) by mouth daily. 60 tablet 1  . traMADol (ULTRAM) 50 MG tablet Take 1 tablet (50 mg total) by mouth every 6 (six) hours as needed for severe pain. 30 tablet 0   No current facility-administered medications for this visit.    Allergies:   Megace es  [megestrol acetate] and Sulfonamide derivatives    Social History:  The patient  reports that she has never smoked. She has never used smokeless tobacco. She reports that she does not drink alcohol and does not use drugs.   Family History:  The patient's ***family history includes Glaucoma in her sister; Hypertension in her sister.    ROS:  Please see the history of present illness.   Otherwise, review of systems are positive for {NONE DEFAULTED:18576::"none"}.   All other systems are reviewed and negative.    PHYSICAL EXAM: VS:  There were no vitals taken for this visit. , BMI There is no height or weight on file to calculate BMI.   General: Well developed, well nourished, NAD Skin: Warm, dry, intact  Head: Normocephalic, atraumatic, sclera non-icteric, no xanthomas, clear, moist mucus membranes. Neck: Negative for carotid bruits. No JVD Lungs:Clear to ausculation bilaterally. No wheezes, rales, or rhonchi. Breathing is unlabored. Cardiovascular: RRR with S1 S2. No murmurs, rubs, gallops, or LV heave appreciated. Abdomen: Soft, non-tender, non-distended with normoactive bowel sounds. No hepatomegaly, No rebound/guarding. No obvious abdominal masses. MSK: Strength and tone appear normal for age. 5/5 in all extremities Extremities: No edema. No clubbing or cyanosis. DP/PT pulses 2+ bilaterally Neuro: Alert and oriented. No focal deficits. No facial asymmetry. MAE spontaneously. Psych: Responds to questions appropriately with normal affect.     EKG:  EKG {ACTION; IS/IS XLK:44010272} ordered today. The ekg ordered today demonstrates ***   Recent Labs: 08/04/2019: Magnesium 1.9 12/02/2019: NT-Pro BNP 779 02/24/2020: ALT 19; B Natriuretic Peptide 324.8; Hemoglobin 12.9; Platelets 157 02/25/2020: TSH 2.290 03/05/2020: BUN 75; Creatinine, Ser 6.16; Potassium 3.0; Sodium 138    Lipid Panel    Component Value Date/Time   CHOL 144 04/26/2019 0958   TRIG 101 04/26/2019 0958   HDL 61  04/26/2019 0958   CHOLHDL 2.4 04/26/2019 0958   CHOLHDL 2.6 11/09/2016 1054   VLDL 15 11/09/2016 1054   LDLCALC 63 04/26/2019 0958    Wt Readings from Last 3 Encounters:  03/05/20 247 lb 1.6 oz (112.1 kg)  02/14/20 251 lb 3.2 oz (113.9 kg)  02/03/20 256 lb (116.1 kg)    Other studies Reviewed: Additional studies/ records that were reviewed today include: ***. Review of the above records demonstrates: ***   Cardiac cath 02/11/20  Hemodynamic findings consistent with  moderate pulmonary hypertension.  1. Moderate pulmonary venous hypertension. Mean PAP 40 mm Hg 2. Elevated LV filling pressures. Mean PCWP 30 mm Hg with prominent V wave to 54 mm Hg 3. Good cardiac output. Index 4.95.  Plan: continue IV diuresis.  Echo 02/05/20 1. Left ventricular ejection fraction, by estimation, is 60 to 65%. The  left ventricle has normal function. The left ventricle has no regional  wall motion abnormalities. Left ventricular diastolic function could not  be evaluated.  2. Right ventricular systolic function is normal. The right ventricular  size is normal. There is mildly elevated pulmonary artery systolic  pressure. The estimated right ventricular systolic pressure is 83.4 mmHg.  3. The mitral valve is degenerative. Trivial mitral valve regurgitation.  No evidence of mitral stenosis.  4. The aortic valve is tricuspid. Aortic valve regurgitation is not  visualized. Mild aortic valve stenosis. Aortic valve area, by VTI measures  1.61 cm. Aortic valve mean gradient measures 12.6 mmHg. Aortic valve Vmax  measures 2.53 m/s.  5. The inferior vena cava is dilated in size with >50% respiratory  variability, suggesting right atrial pressure of 8 mmHg.  _____________  ASSESSMENT AND PLAN:  1.  ***   Current medicines are reviewed at length with the patient today.  The patient {ACTIONS; HAS/DOES NOT HAVE:19233} concerns regarding medicines.  The following changes have been made:  {PLAN;  NO CHANGE:13088:s}  Labs/ tests ordered today include: *** No orders of the defined types were placed in this encounter.    Disposition:   FU with *** in {gen number 1-96:222979} {Days to years:10300}  Signed, Jackie Drown, NP  03/14/2020 5:09 AM    [facility] [place], [place], [place]  [address] Phone: [phone]; Fax: [phone]

## 2020-03-17 ENCOUNTER — Inpatient Hospital Stay: Payer: Medicare Other | Admitting: Family Medicine

## 2020-03-17 NOTE — Telephone Encounter (Signed)
[facility] fax from  03/10/2020 states they have tried several times to reach Ms. Jackie Parker, by phone, for her CPAP supplies. The order has been cancelled.

## 2020-03-23 ENCOUNTER — Ambulatory Visit: Payer: Medicare Other | Admitting: Cardiology

## 2020-03-30 ENCOUNTER — Telehealth: Payer: Self-pay | Admitting: Family Medicine

## 2020-03-30 ENCOUNTER — Ambulatory Visit: Payer: Medicare Other | Admitting: Internal Medicine

## 2020-03-31 DIAGNOSIS — J4541 Moderate persistent asthma with (acute) exacerbation: Secondary | ICD-10-CM | POA: Diagnosis not present

## 2020-03-31 NOTE — Telephone Encounter (Signed)
[facility] was called and informed of paperwork being ready for pick up.  Copy has been placed on Rn travia's desk.

## 2020-03-31 NOTE — Telephone Encounter (Signed)
Tyrone Sage, with [facility], picked up the patients death certificate.

## 2020-04-06 ENCOUNTER — Ambulatory Visit: Payer: Medicare Other | Admitting: Internal Medicine

## 2020-04-12 NOTE — Telephone Encounter (Signed)
Received a death certificate. Please contact [phone] when completed.

## 2020-04-12 DEATH — deceased

## 2020-04-23 ENCOUNTER — Ambulatory Visit: Payer: Medicare Other | Admitting: Gynecologic Oncology

## 2020-04-29 ENCOUNTER — Ambulatory Visit: Payer: Medicare Other | Admitting: Gynecologic Oncology

## 2020-07-01 NOTE — Telephone Encounter (Signed)
error 

## 2021-05-30 IMAGING — DX DG HIP (WITH OR WITHOUT PELVIS) 2V BILAT
2 series · 2 of 2 positions shown · non-contrast
Comparison: 01/22/2020

CLINICAL DATA: Bilateral hip pain for 1 day

EXAM:
DG HIP (WITH OR WITHOUT PELVIS) 2V BILAT

[pelvis ap]
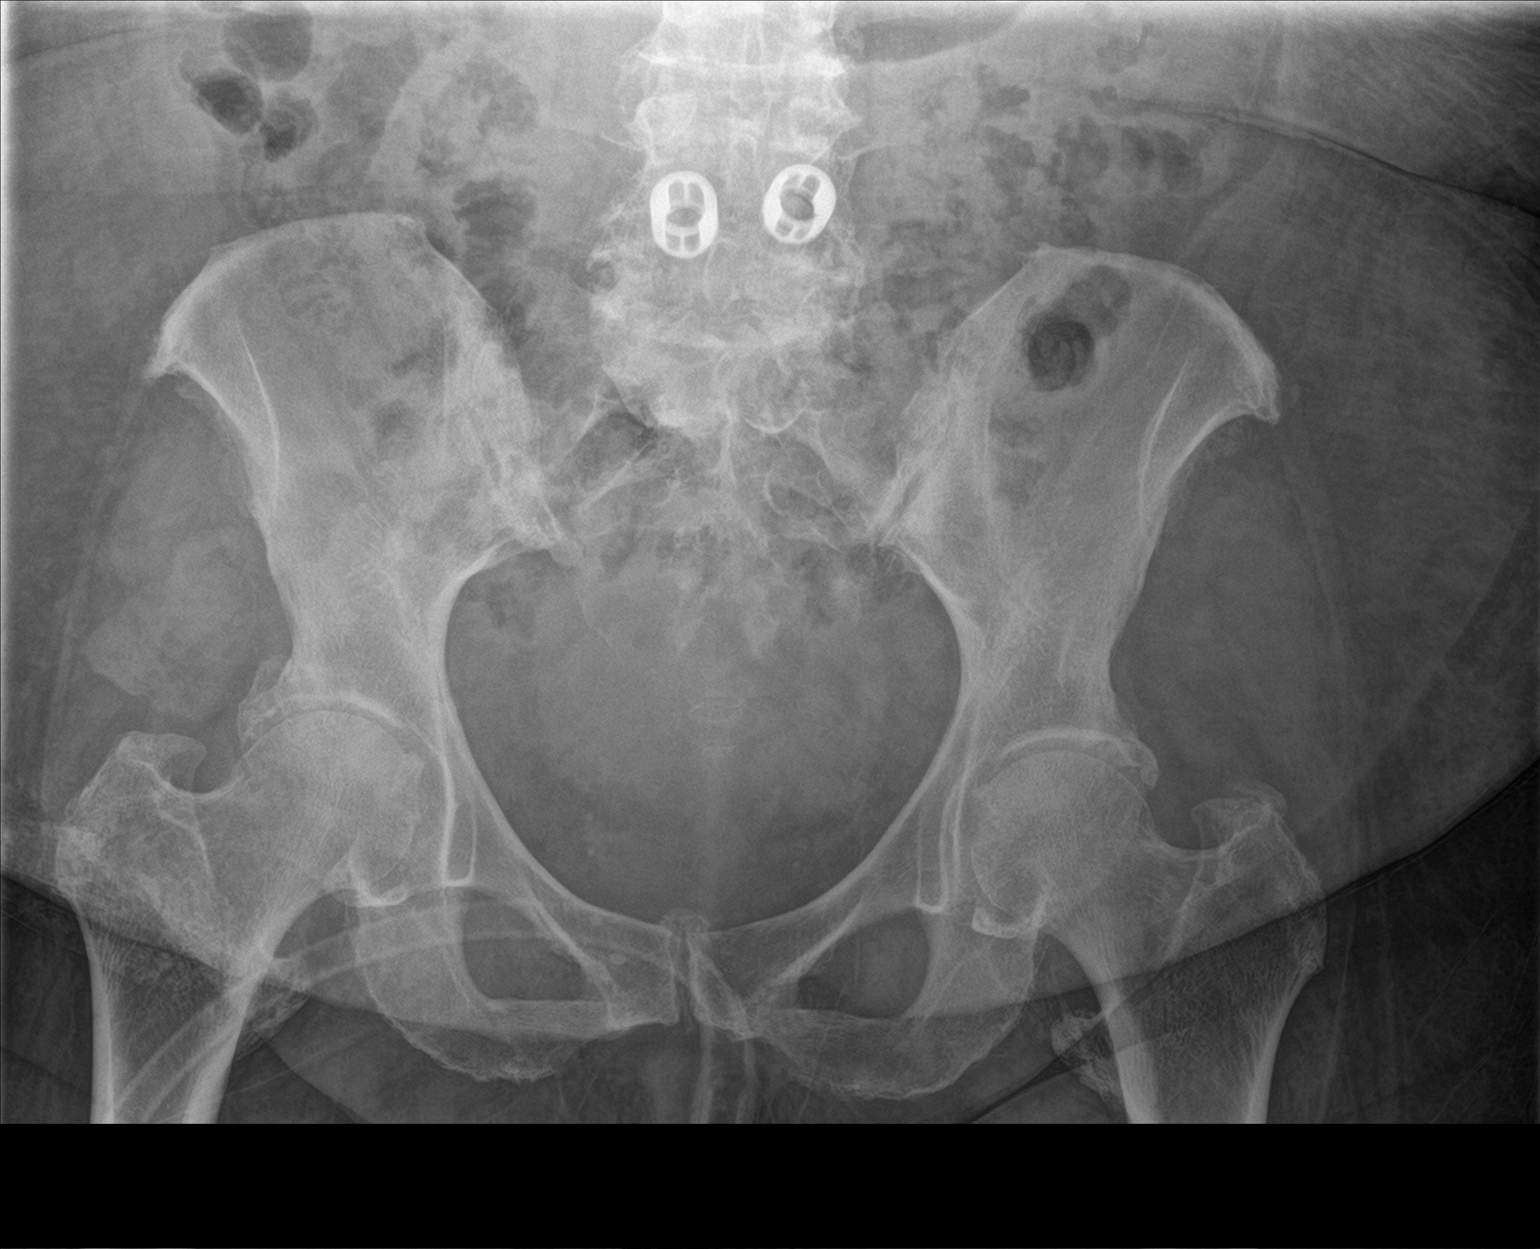

[hip lat]
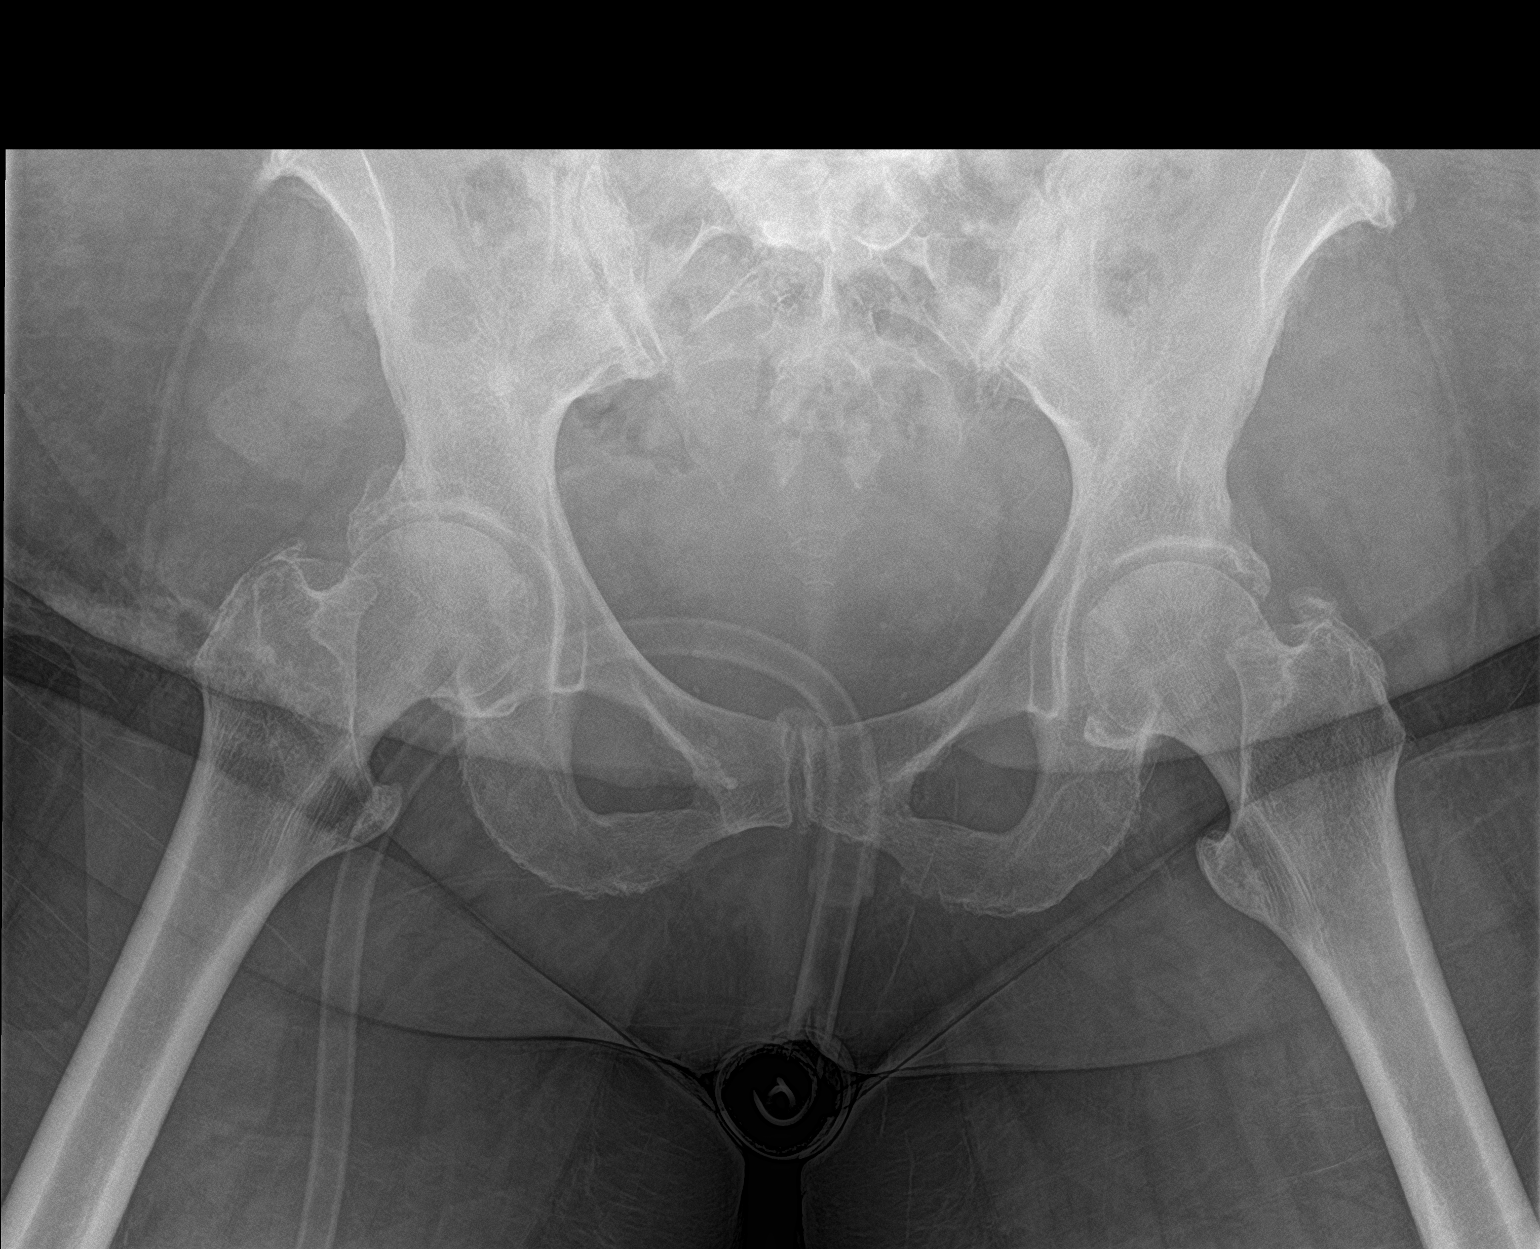

[2 of 2 positions shown; findings below may reference images not displayed]

FINDINGS: Frontal and frogleg lateral views of the bilateral hips are
obtained. There is bilateral hip osteoarthritis, right greater than
left. No fracture, subluxation, or dislocation. Postsurgical changes
at L4-5. Prominent spondylosis at the lumbosacral junction. The
remainder of the bony pelvis is unremarkable.
IMPRESSION: 1. Stable bilateral hip osteoarthritis, right greater than left.
2. No acute bony abnormality.
